# Patient Record
Sex: Female | Born: 1955 | Race: Black or African American | Hispanic: No | Marital: Married | Smoking: Never smoker
Health system: Southern US, Community
[De-identification: ages and names within clinical notes are randomized; demographics above are authoritative.]

## PROBLEM LIST (undated history)

## (undated) DIAGNOSIS — R55 Syncope and collapse: Secondary | ICD-10-CM

## (undated) DIAGNOSIS — M5412 Radiculopathy, cervical region: Secondary | ICD-10-CM

## (undated) DIAGNOSIS — Z973 Presence of spectacles and contact lenses: Secondary | ICD-10-CM

## (undated) DIAGNOSIS — IMO0002 Reserved for concepts with insufficient information to code with codable children: Secondary | ICD-10-CM

## (undated) DIAGNOSIS — Z923 Personal history of irradiation: Secondary | ICD-10-CM

## (undated) DIAGNOSIS — G620 Drug-induced polyneuropathy: Secondary | ICD-10-CM

## (undated) DIAGNOSIS — IMO0001 Reserved for inherently not codable concepts without codable children: Secondary | ICD-10-CM

## (undated) DIAGNOSIS — C50919 Malignant neoplasm of unspecified site of unspecified female breast: Secondary | ICD-10-CM

## (undated) DIAGNOSIS — E039 Hypothyroidism, unspecified: Secondary | ICD-10-CM

## (undated) DIAGNOSIS — Z86718 Personal history of other venous thrombosis and embolism: Secondary | ICD-10-CM

## (undated) DIAGNOSIS — E119 Type 2 diabetes mellitus without complications: Secondary | ICD-10-CM

## (undated) DIAGNOSIS — C7800 Secondary malignant neoplasm of unspecified lung: Secondary | ICD-10-CM

## (undated) DIAGNOSIS — Z860100 Personal history of colon polyps, unspecified: Secondary | ICD-10-CM

## (undated) DIAGNOSIS — I890 Lymphedema, not elsewhere classified: Secondary | ICD-10-CM

## (undated) DIAGNOSIS — H409 Unspecified glaucoma: Secondary | ICD-10-CM

## (undated) DIAGNOSIS — J9610 Chronic respiratory failure, unspecified whether with hypoxia or hypercapnia: Secondary | ICD-10-CM

## (undated) DIAGNOSIS — L853 Xerosis cutis: Secondary | ICD-10-CM

## (undated) DIAGNOSIS — F419 Anxiety disorder, unspecified: Secondary | ICD-10-CM

## (undated) DIAGNOSIS — D649 Anemia, unspecified: Secondary | ICD-10-CM

## (undated) DIAGNOSIS — I10 Essential (primary) hypertension: Secondary | ICD-10-CM

## (undated) DIAGNOSIS — T451X5A Adverse effect of antineoplastic and immunosuppressive drugs, initial encounter: Secondary | ICD-10-CM

## (undated) DIAGNOSIS — L719 Rosacea, unspecified: Secondary | ICD-10-CM

## (undated) DIAGNOSIS — Z8719 Personal history of other diseases of the digestive system: Secondary | ICD-10-CM

## (undated) DIAGNOSIS — K219 Gastro-esophageal reflux disease without esophagitis: Secondary | ICD-10-CM

## (undated) HISTORY — DX: Rosacea, unspecified: L71.9

## (undated) HISTORY — DX: Personal history of colon polyps, unspecified: Z86.0100

## (undated) HISTORY — PX: TONSILLECTOMY: SUR1361

## (undated) HISTORY — PX: COLONOSCOPY: SHX174

## (undated) HISTORY — DX: Syncope and collapse: R55

## (undated) HISTORY — DX: Malignant neoplasm of unspecified site of unspecified female breast: C50.919

## (undated) HISTORY — DX: Gastro-esophageal reflux disease without esophagitis: K21.9

## (undated) HISTORY — DX: Personal history of colonic polyps: Z86.010

## (undated) HISTORY — DX: Essential (primary) hypertension: I10

## (undated) HISTORY — DX: Hypothyroidism, unspecified: E03.9

## (undated) HISTORY — DX: Radiculopathy, cervical region: M54.12

## (undated) HISTORY — DX: Lymphedema, not elsewhere classified: I89.0

---

## 1987-04-05 HISTORY — PX: MASTECTOMY: SHX3

## 1988-06-04 HISTORY — PX: ABDOMINAL HYSTERECTOMY: SHX81

## 1998-03-02 ENCOUNTER — Other Ambulatory Visit: Admission: RE | Admit: 1998-03-02 | Discharge: 1998-03-02 | Payer: Self-pay | Admitting: Gastroenterology

## 2004-09-23 ENCOUNTER — Ambulatory Visit: Payer: Self-pay | Admitting: Internal Medicine

## 2004-10-15 ENCOUNTER — Ambulatory Visit: Payer: Self-pay | Admitting: Internal Medicine

## 2004-11-18 ENCOUNTER — Ambulatory Visit: Payer: Self-pay | Admitting: Internal Medicine

## 2005-01-21 ENCOUNTER — Ambulatory Visit: Payer: Self-pay | Admitting: Internal Medicine

## 2005-03-24 ENCOUNTER — Ambulatory Visit: Payer: Self-pay | Admitting: Internal Medicine

## 2005-07-17 ENCOUNTER — Ambulatory Visit: Payer: Self-pay | Admitting: Internal Medicine

## 2005-09-02 ENCOUNTER — Ambulatory Visit: Payer: Self-pay | Admitting: Cardiology

## 2005-09-02 ENCOUNTER — Ambulatory Visit: Payer: Self-pay | Admitting: Internal Medicine

## 2005-11-12 ENCOUNTER — Ambulatory Visit: Payer: Self-pay | Admitting: Internal Medicine

## 2005-11-20 ENCOUNTER — Ambulatory Visit: Payer: Self-pay | Admitting: Internal Medicine

## 2006-01-02 ENCOUNTER — Ambulatory Visit: Payer: Self-pay | Admitting: Internal Medicine

## 2006-02-02 ENCOUNTER — Ambulatory Visit: Payer: Self-pay | Admitting: Internal Medicine

## 2006-02-09 ENCOUNTER — Ambulatory Visit: Payer: Self-pay | Admitting: Internal Medicine

## 2006-04-23 ENCOUNTER — Ambulatory Visit: Payer: Self-pay | Admitting: Gastroenterology

## 2006-05-05 ENCOUNTER — Ambulatory Visit: Payer: Self-pay | Admitting: Gastroenterology

## 2006-05-05 ENCOUNTER — Encounter (INDEPENDENT_AMBULATORY_CARE_PROVIDER_SITE_OTHER): Payer: Self-pay | Admitting: Specialist

## 2006-10-05 ENCOUNTER — Ambulatory Visit: Payer: Self-pay | Admitting: Internal Medicine

## 2006-11-05 ENCOUNTER — Ambulatory Visit: Payer: Self-pay | Admitting: Internal Medicine

## 2006-11-10 ENCOUNTER — Ambulatory Visit: Payer: Self-pay | Admitting: Internal Medicine

## 2007-02-16 ENCOUNTER — Ambulatory Visit: Payer: Self-pay | Admitting: Internal Medicine

## 2007-02-16 DIAGNOSIS — I10 Essential (primary) hypertension: Secondary | ICD-10-CM

## 2007-02-16 DIAGNOSIS — E039 Hypothyroidism, unspecified: Secondary | ICD-10-CM

## 2007-05-24 ENCOUNTER — Ambulatory Visit: Payer: Self-pay | Admitting: Internal Medicine

## 2007-05-24 LAB — CONVERTED CEMR LAB
AST: 26 units/L (ref 0–37)
Albumin: 4.1 g/dL (ref 3.5–5.2)
Alkaline Phosphatase: 83 units/L (ref 39–117)
BUN: 9 mg/dL (ref 6–23)
Basophils Absolute: 0 10*3/uL (ref 0.0–0.1)
Basophils Relative: 0.1 % (ref 0.0–1.0)
Bilirubin Urine: NEGATIVE
CO2: 29 meq/L (ref 19–32)
Chloride: 105 meq/L (ref 96–112)
Cholesterol: 195 mg/dL (ref 0–200)
Creatinine, Ser: 0.8 mg/dL (ref 0.4–1.2)
HCT: 36.7 % (ref 36.0–46.0)
Hemoglobin: 12.5 g/dL (ref 12.0–15.0)
LDL Cholesterol: 141 mg/dL — ABNORMAL HIGH (ref 0–99)
MCHC: 34 g/dL (ref 30.0–36.0)
Monocytes Relative: 8 % (ref 3.0–11.0)
Neutrophils Relative %: 49.8 % (ref 43.0–77.0)
Nitrite: NEGATIVE
Protein, U semiquant: NEGATIVE
RBC: 4.09 M/uL (ref 3.87–5.11)
RDW: 12.8 % (ref 11.5–14.6)
Total Bilirubin: 0.3 mg/dL (ref 0.3–1.2)
Total CHOL/HDL Ratio: 5.8
Total Protein: 7.3 g/dL (ref 6.0–8.3)
Urobilinogen, UA: NEGATIVE
VLDL: 20 mg/dL (ref 0–40)

## 2007-05-31 ENCOUNTER — Ambulatory Visit: Payer: Self-pay | Admitting: Internal Medicine

## 2007-05-31 DIAGNOSIS — Z860100 Personal history of colon polyps, unspecified: Secondary | ICD-10-CM

## 2007-05-31 DIAGNOSIS — L719 Rosacea, unspecified: Secondary | ICD-10-CM

## 2007-05-31 HISTORY — DX: Rosacea, unspecified: L71.9

## 2007-05-31 HISTORY — DX: Personal history of colon polyps, unspecified: Z86.0100

## 2007-05-31 HISTORY — DX: Personal history of colonic polyps: Z86.010

## 2007-08-17 ENCOUNTER — Telehealth: Payer: Self-pay | Admitting: Internal Medicine

## 2007-08-19 ENCOUNTER — Ambulatory Visit: Payer: Self-pay | Admitting: Internal Medicine

## 2007-10-01 ENCOUNTER — Ambulatory Visit: Payer: Self-pay | Admitting: Internal Medicine

## 2007-10-01 DIAGNOSIS — M5412 Radiculopathy, cervical region: Secondary | ICD-10-CM

## 2007-10-01 HISTORY — DX: Radiculopathy, cervical region: M54.12

## 2007-10-05 ENCOUNTER — Encounter: Admission: RE | Admit: 2007-10-05 | Discharge: 2007-10-05 | Payer: Self-pay | Admitting: Internal Medicine

## 2007-10-08 ENCOUNTER — Telehealth: Payer: Self-pay | Admitting: Internal Medicine

## 2007-10-21 ENCOUNTER — Encounter: Payer: Self-pay | Admitting: Internal Medicine

## 2007-12-22 ENCOUNTER — Telehealth: Payer: Self-pay | Admitting: Internal Medicine

## 2008-08-21 ENCOUNTER — Ambulatory Visit: Payer: Self-pay | Admitting: Internal Medicine

## 2008-08-21 LAB — CONVERTED CEMR LAB
BUN: 7 mg/dL (ref 6–23)
CO2: 33 meq/L — ABNORMAL HIGH (ref 19–32)
Calcium: 9.5 mg/dL (ref 8.4–10.5)
Creatinine, Ser: 0.7 mg/dL (ref 0.4–1.2)
Glucose, Bld: 123 mg/dL — ABNORMAL HIGH (ref 70–99)

## 2008-10-09 ENCOUNTER — Ambulatory Visit: Payer: Self-pay | Admitting: Internal Medicine

## 2008-10-19 ENCOUNTER — Telehealth: Payer: Self-pay | Admitting: Internal Medicine

## 2009-07-09 ENCOUNTER — Ambulatory Visit: Payer: Self-pay | Admitting: Internal Medicine

## 2009-07-09 LAB — CONVERTED CEMR LAB
Albumin: 4.4 g/dL (ref 3.5–5.2)
Alkaline Phosphatase: 77 units/L (ref 39–117)
Basophils Relative: 0.4 % (ref 0.0–3.0)
Calcium: 9.6 mg/dL (ref 8.4–10.5)
Chloride: 99 meq/L (ref 96–112)
Creatinine, Ser: 0.8 mg/dL (ref 0.4–1.2)
Eosinophils Absolute: 0.1 10*3/uL (ref 0.0–0.7)
HCT: 40.8 % (ref 36.0–46.0)
HDL: 38.8 mg/dL — ABNORMAL LOW (ref >39.00)
Hemoglobin: 13.6 g/dL (ref 12.0–15.0)
Lymphocytes Relative: 44.3 % (ref 12.0–46.0)
Lymphs Abs: 3.5 10*3/uL (ref 0.7–4.0)
MCHC: 33.2 g/dL (ref 30.0–36.0)
MCV: 93.9 fL (ref 78.0–100.0)
Neutro Abs: 3.8 10*3/uL (ref 1.4–7.7)
RBC: 4.35 M/uL (ref 3.87–5.11)
TSH: 1.42 microintl units/mL (ref 0.35–5.50)
Total Protein: 8.4 g/dL — ABNORMAL HIGH (ref 6.0–8.3)

## 2009-09-19 ENCOUNTER — Ambulatory Visit: Payer: Self-pay | Admitting: Internal Medicine

## 2009-09-19 LAB — CONVERTED CEMR LAB
BUN: 9 mg/dL (ref 6–23)
Calcium: 9.5 mg/dL (ref 8.4–10.5)
GFR calc non Af Amer: 96.35 mL/min (ref >60)
Glucose, Bld: 101 mg/dL — ABNORMAL HIGH (ref 70–99)
Sodium: 140 meq/L (ref 135–145)

## 2009-10-08 ENCOUNTER — Telehealth: Payer: Self-pay | Admitting: Internal Medicine

## 2010-02-12 ENCOUNTER — Ambulatory Visit: Payer: Self-pay | Admitting: Internal Medicine

## 2010-02-12 LAB — CONVERTED CEMR LAB
Calcium: 9.7 mg/dL (ref 8.4–10.5)
Creatinine, Ser: 0.8 mg/dL (ref 0.4–1.2)
GFR calc non Af Amer: 99.06 mL/min (ref >60)

## 2010-02-20 ENCOUNTER — Telehealth: Payer: Self-pay | Admitting: Internal Medicine

## 2010-05-03 ENCOUNTER — Telehealth: Payer: Self-pay | Admitting: Internal Medicine

## 2010-05-03 ENCOUNTER — Ambulatory Visit: Payer: Self-pay | Admitting: Cardiology

## 2010-05-08 ENCOUNTER — Ambulatory Visit: Payer: Self-pay | Admitting: Internal Medicine

## 2010-05-08 LAB — CONVERTED CEMR LAB
ALT: 16 units/L (ref 0–35)
Albumin: 4.3 g/dL (ref 3.5–5.2)
BUN: 13 mg/dL (ref 6–23)
Bilirubin Urine: NEGATIVE
Chloride: 104 meq/L (ref 96–112)
Cholesterol: 194 mg/dL (ref 0–200)
Eosinophils Relative: 1 % (ref 0.0–5.0)
Glucose, Bld: 112 mg/dL — ABNORMAL HIGH (ref 70–99)
Glucose, Urine, Semiquant: NEGATIVE
HCT: 38.2 % (ref 36.0–46.0)
Hemoglobin: 12.8 g/dL (ref 12.0–15.0)
Lymphs Abs: 2.4 10*3/uL (ref 0.7–4.0)
MCV: 93.4 fL (ref 78.0–100.0)
Monocytes Absolute: 0.4 10*3/uL (ref 0.1–1.0)
Neutro Abs: 3.1 10*3/uL (ref 1.4–7.7)
Platelets: 233 10*3/uL (ref 150.0–400.0)
Potassium: 4.5 meq/L (ref 3.5–5.1)
RDW: 13.7 % (ref 11.5–14.6)
TSH: 1.47 microintl units/mL (ref 0.35–5.50)
Total Bilirubin: 0.6 mg/dL (ref 0.3–1.2)
Total Protein: 7.6 g/dL (ref 6.0–8.3)
WBC: 6 10*3/uL (ref 4.5–10.5)
pH: 7

## 2010-05-15 ENCOUNTER — Ambulatory Visit: Payer: Self-pay | Admitting: Internal Medicine

## 2010-05-15 DIAGNOSIS — R55 Syncope and collapse: Secondary | ICD-10-CM

## 2010-05-15 HISTORY — DX: Syncope and collapse: R55

## 2010-05-21 ENCOUNTER — Encounter: Payer: Self-pay | Admitting: Internal Medicine

## 2010-06-19 ENCOUNTER — Telehealth (INDEPENDENT_AMBULATORY_CARE_PROVIDER_SITE_OTHER): Payer: Self-pay

## 2010-06-20 ENCOUNTER — Encounter (HOSPITAL_COMMUNITY)
Admission: RE | Admit: 2010-06-20 | Discharge: 2010-08-03 | Payer: Self-pay | Source: Home / Self Care | Attending: Internal Medicine | Admitting: Internal Medicine

## 2010-06-20 ENCOUNTER — Ambulatory Visit: Payer: Self-pay

## 2010-06-20 ENCOUNTER — Encounter: Payer: Self-pay | Admitting: Cardiovascular Disease

## 2010-06-20 ENCOUNTER — Encounter: Payer: Self-pay | Admitting: Cardiology

## 2010-06-20 ENCOUNTER — Ambulatory Visit: Payer: Self-pay | Admitting: Cardiology

## 2010-06-24 ENCOUNTER — Telehealth: Payer: Self-pay | Admitting: Internal Medicine

## 2010-08-21 ENCOUNTER — Ambulatory Visit: Admit: 2010-08-21 | Payer: Self-pay | Admitting: Internal Medicine

## 2010-08-24 ENCOUNTER — Encounter: Payer: Self-pay | Admitting: Internal Medicine

## 2010-09-03 NOTE — Progress Notes (Signed)
  Phone Note Outgoing Call   Call placed by: Lynann Beaver CMA,  May 03, 2010 3:24 PM Call placed to: Patient Summary of Call: Per Dr. Lovell Sheehan........Marland KitchenIce the affected area of swelling around eyes...Marland KitchenMarland KitchenIbuprofen 800mg . three times a day and call Opthamalogy if any visual disturbances.  Left message on personal voice mail. Initial call taken by: Lynann Beaver CMA,  May 03, 2010 3:25 PM

## 2010-09-03 NOTE — Progress Notes (Signed)
Summary: K ?  Phone Note Call from Patient Call back at Home Phone [phone]   Caller: vm Reason for Call: Lab or Test Results Summary of Call: Tues.  Am I to continue K or discontinue? Initial call taken by: Rudy Jew, RN,  February 20, 2010 2:14 PM  Follow-up for Phone Call        CONTINUE Follow-up by: Stacie Glaze MD,  February 20, 2010 3:20 PM  Additional Follow-up for Phone Call Additional follow up Details #1::        Phone Call Completed Additional Follow-up by: Rudy Jew, RN,  February 20, 2010 3:41 PM

## 2010-09-03 NOTE — Miscellaneous (Signed)
Summary: Appointment Canceled  Appointment status changed to canceled by [employer] on 05/16/2010 11:48 AM.  Cancellation Comments --------------------- crs/ wt: 216/ dx: dizziness, abd ekg, dr. Lovell Sheehan, [email or web address] precret requ./gd  Appointment Information ----------------------- Appt Type:  CARDIOLOGY NUCLEAR TESTING      Date:  Thursday, May 23, 2010      Time:  8:00 AM for 15 min   Urgency:  Routine   Made By:  Hoy Finlay Scheduler  To Visit:  [id number]    Reason:  crs/ wt: 216/ dx: dizziness, abd ekg, dr. Lovell Sheehan, [email or web address] precret requ./gd  Appt Comments ------------- -- 05/16/10 11:48: ([facility]) CANCELED -- crs/ wt: 216/ dx: dizziness, abd ekg, dr. Lovell Sheehan, bcbs.no precret requ./gd -- 05/16/10 9:35: ([facility]) BOOKED -- Routine CARDIOLOGY NUCLEAR TESTING at 05/23/2010 8:00 AM for 15 min crs/ wt: 216/ dx: dizziness, abd e  Appended Document: Appointment Canceled pt cancelled cardiolyte with abnormal ekg please call  Appended Document: Appointment Canceled pt rescheduled till 11-17

## 2010-09-03 NOTE — Assessment & Plan Note (Signed)
Summary: back and knee pain//ccm   Vital Signs:  Patient profile:   55 year old female Weight:      225 pounds BMI:     37.58 Temp:     98.2 degrees F oral Pulse rate:   74 / minute Pulse rhythm:   regular BP sitting:   136 / 84  Nutrition Counseling: Patient's BMI is greater than 25 and therefore counseled on weight management options.  History of Present Illness: reveiw of labs discussion of potassium and moniterin the effect of adding potassium foods acute increasd pain in right knee in meniscal areaq consitant with meniscal tear. gate issues have caused mild back spasms obesity and age are the root problems with failyre to maintain exercize   Problems Prior to Update: 1)  Dermatofibroma, Arm, Right  (ICD-216.6) 2)  Anxiety Depression  (ICD-300.4) 3)  Cervical Strain, With Radiculopathy  (ICD-723.4) 4)  Uri  (ICD-465.9) 5)  Neck Pain, Right  (ICD-723.1) 6)  Mastectomy, Right, Hx of  (ICD-V45.71) 7)  Morbid Obesity  (ICD-278.01) 8)  Colonic Polyps, Hx of  (ICD-V12.72) 9)  Family History of Cad Female 1st Degree Relative <50  (ICD-V17.3) 10)  Family History of Colon Ca 1st Degree Relative <60  (ICD-V16.0) 11)  Rosacea  (ICD-695.3) 12)  Physical Examination  (ICD-V70.0) 13)  Skin Rash  (ICD-782.1) 14)  Hypothyroidism  (ICD-244.9) 15)  Hypertension  (ICD-401.9) 16)  Gerd  (ICD-530.81) 17)  Allergic Rhinitis  (ICD-477.9)  Medications Prior to Update: 1)  Synthroid 50 Mcg  Tabs (Levothyroxine Sodium) .Marland Kitchen.. 1 By Mouth Once Daily 2)  Cardizem Cd 240 Mg  Cp24 (Diltiazem Hcl Coated Beads) .Marland Kitchen.. 1 By Mouth Once Daily 3)  Microzide 12.5 Mg  Caps (Hydrochlorothiazide) .Marland Kitchen.. 1 By Mouth Once Daily 4)  Nexium 40 Mg  Cpdr (Esomeprazole Magnesium) .Marland Kitchen.. 1 By Mouth Once Daily 5)  Xalatan 0.005 %  Soln (Latanoprost) .... Once Daily  Current Medications (verified): 1)  Synthroid 50 Mcg  Tabs (Levothyroxine Sodium) .Marland Kitchen.. 1 By Mouth Once Daily 2)  Cardizem Cd 240 Mg  Cp24 (Diltiazem Hcl  Coated Beads) .Marland Kitchen.. 1 By Mouth Once Daily 3)  Microzide 12.5 Mg  Caps (Hydrochlorothiazide) .Marland Kitchen.. 1 By Mouth Once Daily 4)  Nexium 40 Mg  Cpdr (Esomeprazole Magnesium) .Marland Kitchen.. 1 By Mouth Once Daily 5)  Xalatan 0.005 %  Soln (Latanoprost) .... Once Daily  Allergies: 1)  ! Sulfa  Past History:  Family History: Last updated: 05/31/2007 mother colon cancer Family History of Colon CA 1st degree relative <60 father Family History of CAD Female 1st degree relative <50  Social History: Last updated: 05/31/2007 Married Never Smoked Alcohol use-yes Drug use-no Regular exercise-no  Risk Factors: Exercise: no (05/31/2007)  Risk Factors: Smoking Status: never (07/09/2009)  Past medical, surgical, family and social histories (including risk factors) reviewed, and no changes noted (except as noted below).  Past Medical History: Reviewed history from 10/01/2007 and no changes required. Allergic rhinitis GERD Hypertension Hypothyroidism rosacia Colonic polyps, hx of weight gain  Past Surgical History: Reviewed history from 05/31/2007 and no changes required. Hysterectomy Tonsillectomy Caesarean section x1 Mastectomy  right 1988  Family History: Reviewed history from 05/31/2007 and no changes required. mother colon cancer Family History of Colon CA 1st degree relative <60 father Family History of CAD Female 1st degree relative <50  Social History: Reviewed history from 05/31/2007 and no changes required. Married Never Smoked Alcohol use-yes Drug use-no Regular exercise-no  Review of Systems  The patient denies anorexia,  fever, weight loss, weight gain, vision loss, decreased hearing, hoarseness, chest pain, syncope, dyspnea on exertion, peripheral edema, prolonged cough, headaches, hemoptysis, abdominal pain, melena, hematochezia, severe indigestion/heartburn, hematuria, incontinence, genital sores, muscle weakness, suspicious skin lesions, transient blindness, difficulty  walking, depression, unusual weight change, abnormal bleeding, enlarged lymph nodes, angioedema, and breast masses.    Physical Exam  General:  Well-developed,well-nourished,in no acute distress; alert,appropriate and cooperative throughout examination  overweight-appearing.   Head:  atraumatic.  normocephalic.   Ears:  R ear normal and L ear normal.   Nose:  no external deformity and nasal dischargemucosal pallor.   Mouth:  pharynx pink and moist and no erythema.   Neck:  full ROM, no masses, and nuchal rigidity.   Lungs:  normal respiratory effort and no wheezes.   Heart:  normal rate and regular rhythm.   Abdomen:  soft, non-tender, and normal bowel sounds.   Msk:  No deformity or scoliosis noted of thoracic or lumbar spine.   Neurologic:  alert & oriented X3, gait normal, and DTRs symmetrical and normal.     Impression & Recommendations:  Problem # 1:  HYPOTHYROIDISM (ICD-244.9) Assessment Unchanged  Her updated medication list for this problem includes:    Synthroid 50 Mcg Tabs (Levothyroxine sodium) .Marland Kitchen... 1 by mouth once daily  Labs Reviewed: TSH: 1.42 (07/09/2009)   Total T4: 9.0 (08/21/2008)    Chol: 203 (07/09/2009)   HDL: 38.80 (07/09/2009)   LDL: 141 (05/24/2007)   TG: 101 (05/24/2007)  Problem # 2:  HYPERTENSION (ICD-401.9) Assessment: Deteriorated  Her updated medication list for this problem includes:    Cardizem Cd 240 Mg Cp24 (Diltiazem hcl coated beads) .Marland Kitchen... 1 by mouth once daily    Microzide 12.5 Mg Caps (Hydrochlorothiazide) .Marland Kitchen... 1 by mouth once daily  BP today: 136/84 Prior BP: 140/80 (07/09/2009)  Prior 10 Yr Risk Heart Disease: 20 % (10/01/2007)  Labs Reviewed: K+: 3.2 (07/09/2009) Creat: : 0.8 (07/09/2009)   Chol: 203 (07/09/2009)   HDL: 38.80 (07/09/2009)   LDL: 141 (05/24/2007)   TG: 101 (05/24/2007)  Problem # 3:  HYPOKALEMIA, MILD (ICD-276.8)  monitering she is taking otc potassium and we need to see if this is sufficient  Orders: TLB-BMP  (Basic Metabolic Panel-BMET) (80048-METABOL)  Problem # 4:  MORBID OBESITY (ICD-278.01) weight loss  Problem # 5:  LOC OSTEOARTHROS NOT SPEC PRIM/SEC LOWER LEG (ICD-715.36)  Informed consen obtained and then the right knee joint was prepped in a sterile manor and 40 mg depo and 1/2 cc 1% lidocaine injected into the synovial space. After care discussed. Pt tolerated procedure well.  Discussed use of medications, application of heat or cold, and exercises.   Orders: Joint Aspirate / Injection, Large (20610) Depo- Medrol 40mg  (J1030)  Complete Medication List: 1)  Synthroid 50 Mcg Tabs (Levothyroxine sodium) .Marland Kitchen.. 1 by mouth once daily 2)  Cardizem Cd 240 Mg Cp24 (Diltiazem hcl coated beads) .Marland Kitchen.. 1 by mouth once daily 3)  Microzide 12.5 Mg Caps (Hydrochlorothiazide) .Marland Kitchen.. 1 by mouth once daily 4)  Nexium 40 Mg Cpdr (Esomeprazole magnesium) .Marland Kitchen.. 1 by mouth once daily 5)  Xalatan 0.005 % Soln (Latanoprost) .... Once daily  Other Orders: Venipuncture (16109)  Patient Instructions: 1)  weight loss is the key 2)  [email or web address] 3)  Please schedule a follow-up appointment in 3 months.

## 2010-09-03 NOTE — Assessment & Plan Note (Signed)
Summary: Cardiology Nuclear Testing  Nuclear Med Background Indications for Stress Test: Evaluation for Ischemia     Symptoms: Diaphoresis, Dizziness, Light-Headedness, Palpitations    Nuclear Pre-Procedure Cardiac Risk Factors: Family History - CAD, Hypertension Caffeine/Decaff Intake: None NPO After: 7:00 PM Lungs: clear IV 0.9% NS with Angio Cath: 20g     IV Site: L Wrist IV Started by: Stanton Kidney, EMT-P Chest Size (in) 44     Cup Size c     Height (in): 64 Weight (lb): 217 BMI: 37.38  Nuclear Med Study 1 or 2 day study:  1 day     Stress Test Type:  Stress Reading MD:  Olga Millers, MD     Referring MD:  J.Jenkins Resting Radionuclide:  Technetium 26m Tetrofosmin     Resting Radionuclide Dose:  11 mCi  Stress Radionuclide:  Technetium 34m Tetrofosmin     Stress Radionuclide Dose:  33 mCi   Stress Protocol Exercise Time (min):  5:30 min     Max HR:  148 bpm     Predicted Max HR:  166 bpm  Max Systolic BP: 175 mm Hg     Percent Max HR:  89.16 %     METS: 7.0 Rate Pressure Product:  16109    Stress Test Technologist:  Milana Na, EMT-P     Nuclear Technologist:  Domenic Polite, CNMT  Rest Procedure  Myocardial perfusion imaging was performed at rest 45 minutes following the intravenous administration of Technetium 41m Tetrofosmin.  Stress Procedure  The patient exercised for 5:30. The patient stopped due to fatigue and denied any chest pain.  There were non specific ST-T wave changes.  Technetium 71m Tetrofosmin was injected at peak exercise and myocardial perfusion imaging was performed after a brief delay.  QPS Raw Data Images:  Acquisition technically good; normal left ventricular size. Stress Images:  Normal homogeneous uptake in all areas of the myocardium. Rest Images:  Normal homogeneous uptake in all areas of the myocardium. Subtraction (SDS):  No evidence of ischemia. Transient Ischemic Dilatation:  1.04  (Normal <1.22)  Lung/Heart Ratio:  .30   (Normal <0.45)  Quantitative Gated Spect Images QGS EDV:  82 ml QGS ESV:  28 ml QGS EF:  66 % QGS cine images:  Normal wall motion.   Overall Impression  Exercise Capacity: Fair exercise capacity. BP Response: Normal blood pressure response. Clinical Symptoms: No chest pain ECG Impression: No significant ST segment change suggestive of ischemia. Overall Impression: Normal stress nuclear study with no ischemia or infarction.  Appended Document: Cardiology Nuclear Testing normal cardiolyte  Appended Document: Cardiology Nuclear Testing pt informed

## 2010-09-03 NOTE — Progress Notes (Signed)
Summary: results needed  Phone Note Call from Patient Call back at Home Phone [phone]   Caller: Patient---triage vm Reason for Call: Lab or Test Results Summary of Call: requesting stress test results. Initial call taken by: Warnell Forester,  June 24, 2010 12:57 PM  Follow-up for Phone Call        pt informed nonrmal stress Follow-up by: Willy Eddy, LPN,  June 24, 2010 1:12 PM

## 2010-09-03 NOTE — Progress Notes (Signed)
Summary: lab results.  Phone Note Call from Patient   Caller: Patient Call For: Stacie Glaze MD Complaint: Urinary/GYN Problems Summary of Call: Pt is calling for lab results. [id number] Initial call taken by: Lynann Beaver CMA,  October 08, 2009 10:10 AM  Follow-up for Phone Call        THIS WAS A REPEAT FROM LOW K LAST OV- ON  HCTZ Follow-up by: Willy Eddy, LPN,  October 08, 2009 10:34 AM  Additional Follow-up for Phone Call Additional follow up Details #1::        per dr Lovell Sheehan- start k 8 meq  once daily  Additional Follow-up by: Willy Eddy, LPN,  October 08, 2009 12:04 PM    New/Updated Medications: KLOR-CON 8 MEQ CR-TABS (POTASSIUM CHLORIDE) 1 once daily Prescriptions: KLOR-CON 8 MEQ CR-TABS (POTASSIUM CHLORIDE) 1 once daily  #30 x 6   Entered by:   Willy Eddy, LPN   Authorized by:   Stacie Glaze MD   Signed by:   Willy Eddy, LPN on 36/64/4034   Method used:   Electronically to        [facility]  [address] [phone]* (retail)       [address] [place], [place]  [address]       Ph: [phone] or [phone]       Fax: [phone]   RxID:   [id number]

## 2010-09-03 NOTE — Progress Notes (Signed)
Summary: head trauma  Phone Note Call from Patient   Caller: Patient Call For: Stacie Glaze MD Reason for Call: Acute Illness Summary of Call: [phone] Pt had a fall on Wed and bumped head on end table with swelling of eyes and headache. Would like to be seen today. Initial call taken by: Lynann Beaver CMA,  May 03, 2010 8:26 AM  Follow-up for Phone Call        non contrast ct of head for facial trauma per dr Lovell Sheehan Follow-up by: Willy Eddy, LPN,  May 03, 2010 9:15 AM  Additional Follow-up for Phone Call Additional follow up Details #1::        Appt Scheduled Today.  Pt aware. Additional Follow-up by: Corky Mull,  May 03, 2010 9:40 AM  New Problems: CONTUSION OF FACE SCALP AND NECK EXCEPT EYE (ICD-920)   New Problems: CONTUSION OF FACE SCALP AND NECK EXCEPT EYE (ICD-920)

## 2010-09-03 NOTE — Progress Notes (Signed)
Summary: Nuc. Pre-Procedure  Phone Note Outgoing Call Call back at [facility] Phone [phone]   Call placed by: Irean Hong, RN,  June 19, 2010 2:04 PM Summary of Call: Left message with information on [employer] Information Sheet (see scanned document for details).      Nuclear Med Background Indications for Stress Test: Evaluation for Ischemia     Symptoms: Diaphoresis    Nuclear Pre-Procedure Cardiac Risk Factors: Family History - CAD, Hypertension Height (in): 65

## 2010-09-03 NOTE — Assessment & Plan Note (Signed)
Summary: cpx--no pap//ccm   Vital Signs:  Patient profile:   55 year old female Height:      65 inches Weight:      216 pounds BMI:     36.07 Temp:     98.2 degrees F oral Pulse rate:   72 / minute Resp:     14 per minute BP sitting:   136 / 80  (left arm)  Vitals Entered By: Willy Eddy, LPN (May 15, 2010 11:28 AM) CC: cpx Is Patient Diabetic? No   Primary Care Provider:  Stacie Glaze MD  CC:  cpx.  History of Present Illness: The pt was asked about all immunizations, health maint. services that are appropriate to their age and was given guidance on diet exercize  and weight management   Falls? dizzy? could this be hyoglycemia frequent falls presyncopy? No chest pain, but pt is vague about what she feels prior to these evenet has mild diaphoresis prediabetes and obesit complicate this picture family hx is positive  Preventive Screening-Counseling & Management  Alcohol-Tobacco     Smoking Status: never  Problems Prior to Update: 1)  Prediabetes  (ICD-790.29) 2)  Contusion of Face Scalp and Neck Except Eye  (ICD-920) 3)  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  (ICD-715.36) 4)  Hypokalemia, Mild  (ICD-276.8) 5)  Dermatofibroma, Arm, Right  (ICD-216.6) 6)  Anxiety Depression  (ICD-300.4) 7)  Cervical Strain, With Radiculopathy  (ICD-723.4) 8)  Uri  (ICD-465.9) 9)  Neck Pain, Right  (ICD-723.1) 10)  Mastectomy, Right, Hx of  (ICD-V45.71) 11)  Morbid Obesity  (ICD-278.01) 12)  Colonic Polyps, Hx of  (ICD-V12.72) 13)  Family History of Cad Female 1st Degree Relative <50  (ICD-V17.3) 14)  Family History of Colon Ca 1st Degree Relative <60  (ICD-V16.0) 15)  Rosacea  (ICD-695.3) 16)  Physical Examination  (ICD-V70.0) 17)  Skin Rash  (ICD-782.1) 18)  Hypothyroidism  (ICD-244.9) 19)  Hypertension  (ICD-401.9) 20)  Gerd  (ICD-530.81) 21)  Allergic Rhinitis  (ICD-477.9)  Current Problems (verified): 1)  Contusion of Face Scalp and Neck Except Eye   (ICD-920) 2)  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  (ICD-715.36) 3)  Hypokalemia, Mild  (ICD-276.8) 4)  Dermatofibroma, Arm, Right  (ICD-216.6) 5)  Anxiety Depression  (ICD-300.4) 6)  Cervical Strain, With Radiculopathy  (ICD-723.4) 7)  Uri  (ICD-465.9) 8)  Neck Pain, Right  (ICD-723.1) 9)  Mastectomy, Right, Hx of  (ICD-V45.71) 10)  Morbid Obesity  (ICD-278.01) 11)  Colonic Polyps, Hx of  (ICD-V12.72) 12)  Family History of Cad Female 1st Degree Relative <50  (ICD-V17.3) 13)  Family History of Colon Ca 1st Degree Relative <60  (ICD-V16.0) 14)  Rosacea  (ICD-695.3) 15)  Physical Examination  (ICD-V70.0) 16)  Skin Rash  (ICD-782.1) 17)  Hypothyroidism  (ICD-244.9) 18)  Hypertension  (ICD-401.9) 19)  Gerd  (ICD-530.81) 20)  Allergic Rhinitis  (ICD-477.9)  Medications Prior to Update: 1)  Synthroid 50 Mcg  Tabs (Levothyroxine Sodium) .Marland Kitchen.. 1 By Mouth Once Daily 2)  Cardizem Cd 240 Mg  Cp24 (Diltiazem Hcl Coated Beads) .Marland Kitchen.. 1 By Mouth Once Daily 3)  Microzide 12.5 Mg  Caps (Hydrochlorothiazide) .Marland Kitchen.. 1 By Mouth Once Daily 4)  Nexium 40 Mg  Cpdr (Esomeprazole Magnesium) .Marland Kitchen.. 1 By Mouth Once Daily 5)  Xalatan 0.005 %  Soln (Latanoprost) .... Once Daily 6)  Klor-Con 8 Meq Cr-Tabs (Potassium Chloride) .Marland Kitchen.. 1 Once Daily  Current Medications (verified): 1)  Synthroid 50 Mcg  Tabs (Levothyroxine Sodium) .Marland Kitchen.. 1 By Mouth Once Daily 2)  Cardizem Cd 240 Mg  Cp24 (Diltiazem Hcl Coated Beads) .Marland Kitchen.. 1 By Mouth Once Daily 3)  Microzide 12.5 Mg  Caps (Hydrochlorothiazide) .Marland Kitchen.. 1 By Mouth Once Daily 4)  Nexium 40 Mg  Cpdr (Esomeprazole Magnesium) .Marland Kitchen.. 1 By Mouth Once Daily 5)  Xalatan 0.005 %  Soln (Latanoprost) .... Once Daily 6)  Klor-Con 8 Meq Cr-Tabs (Potassium Chloride) .Marland Kitchen.. 1 Once Daily  Allergies (verified): 1)  ! Sulfa  Past History:  Family History: Last updated: 05/31/2007 mother colon cancer Family History of Colon CA 1st degree relative <60 father Family History of CAD Female  1st degree relative <50  Social History: Last updated: 05/31/2007 Married Never Smoked Alcohol use-yes Drug use-no Regular exercise-no  Risk Factors: Exercise: no (05/31/2007)  Risk Factors: Smoking Status: never (05/15/2010)  Past medical, surgical, family and social histories (including risk factors) reviewed, and no changes noted (except as noted below).  Past Medical History: Reviewed history from 10/01/2007 and no changes required. Allergic rhinitis GERD Hypertension Hypothyroidism rosacia Colonic polyps, hx of weight gain  Past Surgical History: Reviewed history from 05/31/2007 and no changes required. Hysterectomy Tonsillectomy Caesarean section x1 Mastectomy  right 1988  Family History: Reviewed history from 05/31/2007 and no changes required. mother colon cancer Family History of Colon CA 1st degree relative <60 father Family History of CAD Female 1st degree relative <50  Social History: Reviewed history from 05/31/2007 and no changes required. Married Never Smoked Alcohol use-yes Drug use-no Regular exercise-no  Review of Systems  The patient denies anorexia, fever, weight loss, weight gain, vision loss, decreased hearing, hoarseness, chest pain, syncope, dyspnea on exertion, peripheral edema, prolonged cough, headaches, hemoptysis, abdominal pain, melena, hematochezia, severe indigestion/heartburn, hematuria, incontinence, genital sores, muscle weakness, suspicious skin lesions, transient blindness, difficulty walking, depression, unusual weight change, abnormal bleeding, enlarged lymph nodes, angioedema, and breast masses.    Physical Exam  General:  well-developed and overweight-appearing.   Head:  normocephalic.   Eyes:  pupils equal and pupils round.   Ears:  R ear normal and L ear normal.   Nose:  no external deformity and no nasal discharge.   Mouth:  pharynx pink and moist and no erythema.   Neck:  No deformities, masses, or tenderness  noted. Lungs:  Normal respiratory effort, chest expands symmetrically. Lungs are clear to auscultation, no crackles or wheezes. Heart:  Normal rate and regular rhythm. S1 and S2 normal without gallop, murmur, click, rub or other extra sounds. Abdomen:  soft, non-tender, and normal bowel sounds.   Msk:  No deformity or scoliosis noted of thoracic or lumbar spine.   Pulses:  R and L carotid,radial,femoral,dorsalis pedis and posterior tibial pulses are full and equal bilaterally Extremities:  trace left pedal edema and trace right pedal edema.   Neurologic:  alert & oriented X3, gait normal, and DTRs symmetrical and normal.   Cervical Nodes:  No lymphadenopathy noted Axillary Nodes:  No palpable lymphadenopathy Inguinal Nodes:  No significant adenopathy Psych:  Oriented X3 and not depressed appearing.     Impression & Recommendations:  Problem # 1:  CONTUSION OF FACE SCALP AND NECK EXCEPT EYE (ICD-920) falls due to hypoglucemia change of diet  also high CV risks stress test needed  Problem # 2:  PHYSICAL EXAMINATION (ICD-V70.0) The pt was asked about all immunizations, health maint. services that are appropriate to their age and was given guidance on diet exercize  and weight management  Mammogram:  Done (07/23/2004) Pap smear: normal (06/02/2008) Colonoscopy: Done (05/05/2006) Td Booster: Tdap (05/15/2010)   Chol: 194 (05/08/2010)   HDL: 45.10 (05/08/2010)   LDL: 126 (05/08/2010)   TG: 114.0 (05/08/2010) TSH: 1.47 (05/08/2010)   Next Colonoscopy due:: 05/2011 (05/15/2010)  Discussed using sunscreen, use of alcohol, drug use, self breast exam, routine dental care, routine eye care, schedule for GYN exam, routine physical exam, seat belts, multiple vitamins, osteoporosis prevention, adequate calcium intake in diet, recommendations for immunizations, mammograms and Pap smears.  Discussed exercise and checking cholesterol.  Discussed gun safety, safe sex, and contraception.  Problem # 3:   PREDIABETES (ICD-790.29) Assessment: New  discussed diet and weigth loss changes high risk for DM Labs Reviewed: Creat: 0.8 (05/08/2010)     Orders: Cardiology Referral (Cardiology)  Problem # 4:  SYNCOPE (ICD-780.2) possible syncopy related to falls with EKG changes and risk of HTN and prediabetes ( and family hx) will refer Orders: Cardiology Referral (Cardiology) could this be an angiinal equivalent?? risk stratification: higher risk due to age AA femalel and DM and HTN and family hx  Complete Medication List: 1)  Synthroid 50 Mcg Tabs (Levothyroxine sodium) .Marland Kitchen.. 1 by mouth once daily 2)  Cardizem Cd 240 Mg Cp24 (Diltiazem hcl coated beads) .Marland Kitchen.. 1 by mouth once daily 3)  Microzide 12.5 Mg Caps (Hydrochlorothiazide) .Marland Kitchen.. 1 by mouth once daily 4)  Nexium 40 Mg Cpdr (Esomeprazole magnesium) .Marland Kitchen.. 1 by mouth once daily 5)  Xalatan 0.005 % Soln (Latanoprost) .... Once daily 6)  Klor-con 8 Meq Cr-tabs (Potassium chloride) .Marland Kitchen.. 1 once daily  Other Orders: Tdap => 46yrs IM (40981) Admin 1st Vaccine (19147)  Patient Instructions: 1)  Please schedule a follow-up appointment in 3 months. 2)  follow a frequent  small meals and add protein to diet 3)  I have referred you for a stress test   Preventive Care Screening  Pap Smear:    Date:  06/02/2008    Next Due:  06/2011    Results:  normal   Colonoscopy:    Next Due:  05/2011      Immunizations Administered:  Tetanus Vaccine:    Vaccine Type: Tdap    Site: left deltoid    Mfr: GlaxoSmithKline    Dose: 0.5 ml    Route: IM    Given by: Willy Eddy, LPN    Exp. Date: 05/23/2012    Lot #: [id number]    VIS given: 06/21/08 version given May 15, 2010.   Appended Document: Orders Update    Clinical Lists Changes  Orders: Added new Service order of EKG w/ Interpretation (93000) - Signed

## 2010-09-03 NOTE — Assessment & Plan Note (Signed)
Summary: 3 mo rov/mm/pt rescd//ccm/pt rsc/cjr   Vital Signs:  Patient profile:   55 year old female Height:      65 inches Weight:      221 pounds BMI:     36.91 Temp:     98.2 degrees F oral Pulse rate:   76 / minute Resp:     14 per minute BP sitting:   140 / 80  (left arm)  Vitals Entered By: Willy Eddy, LPN (February 12, 2010 4:01 PM) CC: roa- check k+?   CC:  roa- check k+?.  History of Present Illness:  Follow-Up Visit      This is a 55 year old woman who presents for Follow-up visit.  The patient denies chest pain, palpitations, dizziness, syncope, low blood sugar symptoms, high blood sugar symptoms, edema, SOB, DOE, PND, and orthopnea.  Since the last visit the patient notes problems with medications.  When questioned about possible medication side effects, the patient notes cramping.    Preventive Screening-Counseling & Management  Alcohol-Tobacco     Smoking Status: never  Current Medications (verified): 1)  Synthroid 50 Mcg  Tabs (Levothyroxine Sodium) .Marland Kitchen.. 1 By Mouth Once Daily 2)  Cardizem Cd 240 Mg  Cp24 (Diltiazem Hcl Coated Beads) .Marland Kitchen.. 1 By Mouth Once Daily 3)  Microzide 12.5 Mg  Caps (Hydrochlorothiazide) .Marland Kitchen.. 1 By Mouth Once Daily 4)  Nexium 40 Mg  Cpdr (Esomeprazole Magnesium) .Marland Kitchen.. 1 By Mouth Once Daily 5)  Xalatan 0.005 %  Soln (Latanoprost) .... Once Daily 6)  Klor-Con 8 Meq Cr-Tabs (Potassium Chloride) .Marland Kitchen.. 1 Once Daily  Allergies (verified): 1)  ! Sulfa  Past History:  Family History: Last updated: 05/31/2007 mother colon cancer Family History of Colon CA 1st degree relative <60 father Family History of CAD Female 1st degree relative <50  Social History: Last updated: 05/31/2007 Married Never Smoked Alcohol use-yes Drug use-no Regular exercise-no  Risk Factors: Exercise: no (05/31/2007)  Risk Factors: Smoking Status: never (02/12/2010)  Past medical, surgical, family and social histories (including risk factors) reviewed, and no  changes noted (except as noted below).  Past Medical History: Reviewed history from 10/01/2007 and no changes required. Allergic rhinitis GERD Hypertension Hypothyroidism rosacia Colonic polyps, hx of weight gain  Past Surgical History: Reviewed history from 05/31/2007 and no changes required. Hysterectomy Tonsillectomy Caesarean section x1 Mastectomy  right 1988  Family History: Reviewed history from 05/31/2007 and no changes required. mother colon cancer Family History of Colon CA 1st degree relative <60 father Family History of CAD Female 1st degree relative <50  Social History: Reviewed history from 05/31/2007 and no changes required. Married Never Smoked Alcohol use-yes Drug use-no Regular exercise-no  Review of Systems  The patient denies anorexia, fever, weight loss, weight gain, vision loss, decreased hearing, hoarseness, chest pain, syncope, dyspnea on exertion, peripheral edema, prolonged cough, headaches, hemoptysis, abdominal pain, melena, hematochezia, severe indigestion/heartburn, hematuria, incontinence, genital sores, muscle weakness, suspicious skin lesions, transient blindness, difficulty walking, depression, unusual weight change, abnormal bleeding, enlarged lymph nodes, angioedema, and breast masses.    Physical Exam  General:  well-developed and overweight-appearing.   Head:  normocephalic.   Eyes:  pupils equal and pupils round.   Ears:  R ear normal and L ear normal.   Nose:  no external deformity and no nasal discharge.   Neck:  No deformities, masses, or tenderness noted. Chest Wall:  No deformities, masses, or tenderness noted. Lungs:  Normal respiratory effort, chest expands symmetrically. Lungs are clear  to auscultation, no crackles or wheezes. Heart:  Normal rate and regular rhythm. S1 and S2 normal without gallop, murmur, click, rub or other extra sounds.   Impression & Recommendations:  Problem # 1:  MORBID OBESITY (ICD-278.01) weight  loss  Problem # 2:  MASTECTOMY, RIGHT, HX OF (ICD-V45.71)  will need mamogram and will need mastectomy supplies  Orders: Durable Medical Equipment (DME)  Problem # 3:  ANXIETY DEPRESSION (ICD-300.4) stable  Problem # 4:  HYPOKALEMIA, MILD (ICD-276.8)  moniter blood work  Orders: Venipuncture (40102) TLB-BMP (Basic Metabolic Panel-BMET) (80048-METABOL)  Problem # 5:  LOC OSTEOARTHROS NOT SPEC PRIM/SEC LOWER LEG (ICD-715.36) knee has bee stable  Problem # 6:  ROSACEA (ICD-695.3) trial of topical creams  Complete Medication List: 1)  Synthroid 50 Mcg Tabs (Levothyroxine sodium) .Marland Kitchen.. 1 by mouth once daily 2)  Cardizem Cd 240 Mg Cp24 (Diltiazem hcl coated beads) .Marland Kitchen.. 1 by mouth once daily 3)  Microzide 12.5 Mg Caps (Hydrochlorothiazide) .Marland Kitchen.. 1 by mouth once daily 4)  Nexium 40 Mg Cpdr (Esomeprazole magnesium) .Marland Kitchen.. 1 by mouth once daily 5)  Xalatan 0.005 % Soln (Latanoprost) .... Once daily 6)  Klor-con 8 Meq Cr-tabs (Potassium chloride) .Marland Kitchen.. 1 once daily  Patient Instructions: 1)  CPX in OCT

## 2010-09-16 ENCOUNTER — Other Ambulatory Visit: Payer: Self-pay | Admitting: Internal Medicine

## 2010-09-25 ENCOUNTER — Other Ambulatory Visit: Payer: Self-pay | Admitting: Internal Medicine

## 2011-03-21 ENCOUNTER — Other Ambulatory Visit: Payer: Self-pay | Admitting: Internal Medicine

## 2011-06-13 ENCOUNTER — Encounter: Payer: Self-pay | Admitting: Gastroenterology

## 2011-06-19 ENCOUNTER — Other Ambulatory Visit: Payer: Self-pay | Admitting: Internal Medicine

## 2011-06-25 ENCOUNTER — Other Ambulatory Visit: Payer: Self-pay | Admitting: Internal Medicine

## 2011-08-14 ENCOUNTER — Encounter: Payer: Self-pay | Admitting: Internal Medicine

## 2011-08-14 ENCOUNTER — Ambulatory Visit (INDEPENDENT_AMBULATORY_CARE_PROVIDER_SITE_OTHER): Payer: BC Managed Care – PPO | Admitting: Internal Medicine

## 2011-08-14 VITALS — BP 144/84 | HR 76 | Temp 98.2°F | Resp 16 | Ht 64.0 in | Wt 230.0 lb

## 2011-08-14 DIAGNOSIS — E039 Hypothyroidism, unspecified: Secondary | ICD-10-CM

## 2011-08-14 DIAGNOSIS — K219 Gastro-esophageal reflux disease without esophagitis: Secondary | ICD-10-CM

## 2011-08-14 DIAGNOSIS — E669 Obesity, unspecified: Secondary | ICD-10-CM

## 2011-08-14 DIAGNOSIS — I10 Essential (primary) hypertension: Secondary | ICD-10-CM

## 2011-08-14 DIAGNOSIS — R5383 Other fatigue: Secondary | ICD-10-CM

## 2011-08-14 MED ORDER — ESOMEPRAZOLE MAGNESIUM 40 MG PO CPDR: 40.0000 mg | DELAYED_RELEASE_CAPSULE | Freq: Every day | ORAL | Status: DC

## 2011-08-14 MED ORDER — LEVOTHYROXINE SODIUM 50 MCG PO TABS: 50.0000 ug | ORAL_TABLET | Freq: Every day | ORAL | Status: DC

## 2011-08-14 MED ORDER — POTASSIUM CHLORIDE 8 MEQ PO TBCR: 8.0000 meq | EXTENDED_RELEASE_TABLET | Freq: Every day | ORAL | Status: DC

## 2011-08-14 MED ORDER — HYDROCHLOROTHIAZIDE 12.5 MG PO CAPS: 12.5000 mg | ORAL_CAPSULE | ORAL | Status: DC

## 2011-08-14 MED ORDER — DILTIAZEM HCL ER COATED BEADS 240 MG PO CP24: 240.0000 mg | ORAL_CAPSULE | Freq: Every day | ORAL | Status: DC

## 2011-08-14 NOTE — Progress Notes (Signed)
Subjective:    Patient ID: Christine [name], female    DOB: 1955/09/10, 56 y.o.   MRN: [id number]  HPI Weight gain noted Patient is followed for hypothyroidism hypertension gastroesophageal reflux she has morbid obesity and we had agreed to follow a diet and exercise program that would result in weight loss prior to this office visit but indeed she has gained weight.  We discussed in detail the implications of weight gain in the setting of hypertension gastroesophageal reflux and other potential comorbidities it may develop   Review of Systems  Constitutional: Negative for activity change, appetite change and fatigue.  HENT: Negative for ear pain, congestion, neck pain, postnasal drip and sinus pressure.   Eyes: Negative for redness and visual disturbance.  Respiratory: Negative for cough, shortness of breath and wheezing.   Gastrointestinal: Negative for abdominal pain and abdominal distention.  Genitourinary: Negative for dysuria, frequency and menstrual problem.  Musculoskeletal: Negative for myalgias, joint swelling and arthralgias.  Skin: Negative for rash and wound.  Neurological: Negative for dizziness, weakness and headaches.  Hematological: Negative for adenopathy. Does not bruise/bleed easily.  Psychiatric/Behavioral: Negative for sleep disturbance and decreased concentration.   Past Medical History  Diagnosis Date  . ALLERGIC RHINITIS   . GERD (gastroesophageal reflux disease)   . Hypertension   . Hypothyroidism   . Hx of colonic polyps     History   Social History  . Marital Status: Married    Spouse Name: N/A    Number of Children: N/A  . Years of Education: N/A   Occupational History  . Not on file.   Social History Main Topics  . Smoking status: Never Smoker   . Smokeless tobacco: Not on file  . Alcohol Use: Yes  . Drug Use: No  . Sexually Active:    Other Topics Concern  . Not on file   Social History Narrative  . No narrative on file    Past  Surgical History  Procedure Date  . Tonsillectomy   . Caesarean section     x 1    Family History  Problem Relation Age of Onset  . Colon cancer Mother     family hx of colon ca 1st degree relative <60  . Coronary artery disease Father     family hx of CAD female 1st degree relative <50    Allergies  Allergen Reactions  . Sulfonamide Derivatives     Current Outpatient Prescriptions on File Prior to Visit  Medication Sig Dispense Refill  . diltiazem (CARDIZEM CD) 240 MG 24 hr capsule TAKE ONE CAPSULE BY MOUTH EVERY DAY  90 capsule  2  . esomeprazole (NEXIUM) 40 MG packet Take 40 mg by mouth daily.        . hydrochlorothiazide (MICROZIDE) 12.5 MG capsule TAKE ONE CAPSULE BY MOUTH EVERY DAY  90 capsule  0  . latanoprost (XALATAN) 0.005 % ophthalmic solution daily.        Marland Kitchen levothyroxine (SYNTHROID, LEVOTHROID) 50 MCG tablet TAKE 1 TABLET BY MOUTH EVERYDAY  90 tablet  2  . potassium chloride (KLOR-CON) 8 MEQ CR tablet Take 8 mEq by mouth daily.          BP 144/84  Pulse 76  Temp 98.2 F (36.8 C)  Resp 16  Ht 5\' 4"  (1.626 m)  Wt 230 lb (104.327 kg)  BMI 39.48 kg/m2       Objective:   Physical Exam  Nursing note and vitals reviewed. Constitutional: She is oriented  to person, place, and time. She appears well-developed and well-nourished. No distress.  HENT:  Head: Normocephalic and atraumatic.  Right Ear: External ear normal.  Left Ear: External ear normal.  Nose: Nose normal.  Mouth/Throat: Oropharynx is clear and moist.  Eyes: Conjunctivae and EOM are normal. Pupils are equal, round, and reactive to light.  Neck: Normal range of motion. Neck supple. No JVD present. No tracheal deviation present. No thyromegaly present.  Cardiovascular: Normal rate, regular rhythm, normal heart sounds and intact distal pulses.   No murmur heard. Pulmonary/Chest: Effort normal and breath sounds normal. She has no wheezes. She exhibits no tenderness.  Abdominal: Soft. Bowel sounds are  normal.  Musculoskeletal: Normal range of motion. She exhibits no edema and no tenderness.  Lymphadenopathy:    She has no cervical adenopathy.  Neurological: She is alert and oriented to person, place, and time. She has normal reflexes. No cranial nerve deficit.  Skin: Skin is warm and dry. She is not diaphoretic.  Psychiatric: She has a normal mood and affect. Her behavior is normal.          Assessment & Plan:  Weight gain in the setting of morbid obesity  Weight loss counseling was pursued today during our office visit with discussion of diet exercise and setting goals for weight loss  I have spent more than 30 minutes examining this patient face-to-face of which over half was spent in counseling  Blood pressure is stable but has been affected by her weight gain.  Hypothyroidism is a constant diagnosis and T3-T4 TSH will be monitored.

## 2011-09-13 ENCOUNTER — Encounter (HOSPITAL_BASED_OUTPATIENT_CLINIC_OR_DEPARTMENT_OTHER): Payer: Self-pay | Admitting: *Deleted

## 2011-09-13 ENCOUNTER — Emergency Department (HOSPITAL_BASED_OUTPATIENT_CLINIC_OR_DEPARTMENT_OTHER)
Admission: EM | Admit: 2011-09-13 | Discharge: 2011-09-13 | Disposition: A | Discharge status: Home / Self Care | Payer: BC Managed Care – PPO | Attending: Emergency Medicine | Admitting: Emergency Medicine

## 2011-09-13 DIAGNOSIS — E039 Hypothyroidism, unspecified: Secondary | ICD-10-CM | POA: Insufficient documentation

## 2011-09-13 DIAGNOSIS — K219 Gastro-esophageal reflux disease without esophagitis: Secondary | ICD-10-CM | POA: Insufficient documentation

## 2011-09-13 DIAGNOSIS — K529 Noninfective gastroenteritis and colitis, unspecified: Secondary | ICD-10-CM

## 2011-09-13 DIAGNOSIS — K5289 Other specified noninfective gastroenteritis and colitis: Secondary | ICD-10-CM | POA: Insufficient documentation

## 2011-09-13 DIAGNOSIS — I10 Essential (primary) hypertension: Secondary | ICD-10-CM | POA: Insufficient documentation

## 2011-09-13 DIAGNOSIS — Z79899 Other long term (current) drug therapy: Secondary | ICD-10-CM | POA: Insufficient documentation

## 2011-09-13 DIAGNOSIS — R109 Unspecified abdominal pain: Secondary | ICD-10-CM | POA: Insufficient documentation

## 2011-09-13 LAB — DIFFERENTIAL
Basophils Relative: 0 % (ref 0–1)
Lymphocytes Relative: 8 % — ABNORMAL LOW (ref 12–46)
Monocytes Absolute: 0.5 10*3/uL (ref 0.1–1.0)
Monocytes Relative: 4 % (ref 3–12)
Neutro Abs: 10.5 10*3/uL — ABNORMAL HIGH (ref 1.7–7.7)

## 2011-09-13 LAB — COMPREHENSIVE METABOLIC PANEL
BUN: 10 mg/dL (ref 6–23)
CO2: 29 mEq/L (ref 19–32)
Chloride: 98 mEq/L (ref 96–112)
Creatinine, Ser: 0.7 mg/dL (ref 0.50–1.10)
GFR calc non Af Amer: >90 mL/min (ref >90)
Total Bilirubin: 0.3 mg/dL (ref 0.3–1.2)

## 2011-09-13 LAB — URINALYSIS, ROUTINE W REFLEX MICROSCOPIC
Bilirubin Urine: NEGATIVE
Glucose, UA: NEGATIVE mg/dL
Hgb urine dipstick: NEGATIVE
Ketones, ur: 15 mg/dL — AB
Leukocytes, UA: NEGATIVE
pH: 8 (ref 5.0–8.0)

## 2011-09-13 LAB — LIPASE, BLOOD: Lipase: 24 U/L (ref 11–59)

## 2011-09-13 LAB — CBC
HCT: 37.6 % (ref 36.0–46.0)
Hemoglobin: 12.7 g/dL (ref 12.0–15.0)
MCHC: 33.8 g/dL (ref 30.0–36.0)

## 2011-09-13 MED ORDER — SODIUM CHLORIDE 0.9 % IV BOLUS (SEPSIS)
1000.0000 mL | Freq: Once | INTRAVENOUS | Status: AC
  Administered 2011-09-13: 1000 mL via INTRAVENOUS

## 2011-09-13 MED ORDER — ONDANSETRON HCL 4 MG/2ML IJ SOLN
INTRAMUSCULAR | Status: AC
  Filled 2011-09-13: qty 2

## 2011-09-13 MED ORDER — ONDANSETRON HCL 4 MG/2ML IJ SOLN: 4.0000 mg | Freq: Once | INTRAMUSCULAR | Status: DC

## 2011-09-13 MED ORDER — ONDANSETRON HCL 4 MG/2ML IJ SOLN
4.0000 mg | Freq: Once | INTRAMUSCULAR | Status: AC
  Administered 2011-09-13: 4 mg via INTRAVENOUS

## 2011-09-13 MED ORDER — FENTANYL CITRATE 0.05 MG/ML IJ SOLN
100.0000 ug | Freq: Once | INTRAMUSCULAR | Status: AC
  Administered 2011-09-13: 50 ug via INTRAVENOUS
  Filled 2011-09-13: qty 2

## 2011-09-13 MED ORDER — ONDANSETRON 4 MG PO TBDP: 4.0000 mg | ORAL_TABLET | Freq: Three times a day (TID) | ORAL | Status: AC | PRN

## 2011-09-13 MED ORDER — ONDANSETRON HCL 4 MG/2ML IJ SOLN
4.0000 mg | Freq: Once | INTRAMUSCULAR | Status: AC
  Administered 2011-09-13: 4 mg via INTRAVENOUS
  Filled 2011-09-13: qty 2

## 2011-09-13 MED ORDER — FENTANYL CITRATE 0.05 MG/ML IJ SOLN
100.0000 ug | Freq: Once | INTRAMUSCULAR | Status: AC
  Administered 2011-09-13: 100 ug via INTRAVENOUS
  Filled 2011-09-13: qty 2

## 2011-09-13 NOTE — ED Notes (Signed)
Attempted x 3 to start IV, unable to obtain- another RN to try.

## 2011-09-13 NOTE — ED Provider Notes (Signed)
History    Scribed for Christine Shi, MD, the patient was seen in room MH08/MH08. This chart was scribed by Katha Cabal.   CSN: [id number]  Arrival date & time 09/13/11  1635   First MD Initiated Contact with Patient 09/13/11 1805      Chief Complaint  Patient presents with  . Abdominal Pain    (Consider location/radiation/quality/duration/timing/severity/associated sxs/prior treatment) Patient is a 56 y.o. female presenting with abdominal pain. The history is provided by the patient and a relative. No language interpreter was used.  Abdominal Pain The primary symptoms of the illness include abdominal pain, nausea, vomiting and diarrhea. The primary symptoms of the illness do not include fever or hematochezia. Episode onset: at 12 PM. The onset of the illness was sudden. The problem has not changed since onset. The abdominal pain is located in the LLQ and RLQ. The abdominal pain radiates to the back and left flank. The abdominal pain is relieved by nothing.  Additional symptoms associated with the illness include back pain. Significant associated medical issues include GERD.   Abdominal pain was preceded by diarrhea followed by vomiting and lower abdominal pain.  There has been no fever.  Patient took 2 doses of  Pepto Bismol PTA.  Family member reports he had diarrhea about a week ago that has resolved.  There is no abdominal surgical history of cholecystectomy or appendectomy.    Past Medical History  Diagnosis Date  . ALLERGIC RHINITIS   . GERD (gastroesophageal reflux disease)   . Hypertension   . Hypothyroidism   . Hx of colonic polyps     Past Surgical History  Procedure Date  . Tonsillectomy   . Caesarean section     x 1    Family History  Problem Relation Age of Onset  . Colon cancer Mother     family hx of colon ca 1st degree relative <60  . Coronary artery disease Father     family hx of CAD female 1st degree relative <50    History  Substance Use Topics    . Smoking status: Never Smoker   . Smokeless tobacco: Not on file  . Alcohol Use: Yes    OB History    Grav Para Term Preterm Abortions TAB SAB Ect Mult Living                  Review of Systems  Constitutional: Negative for fever.  Gastrointestinal: Positive for nausea, vomiting, abdominal pain and diarrhea. Negative for hematochezia.  Musculoskeletal: Positive for back pain.   Remaining review of systems negative except as noted in the HPI.   Allergies  Sulfonamide derivatives  Home Medications   Current Outpatient Rx  Name Route Sig Dispense Refill  . ASPIRIN 81 MG PO TABS Oral Take 81 mg by mouth daily.    Marland Kitchen DILTIAZEM HCL ER COATED BEADS 240 MG PO CP24 Oral Take 1 capsule (240 mg total) by mouth daily. 90 capsule 2  . HYDROCHLOROTHIAZIDE 12.5 MG PO CAPS Oral Take 1 capsule (12.5 mg total) by mouth every morning. 90 capsule 3  . LATANOPROST 0.005 % OP SOLN Both Eyes Place 1 drop into both eyes at bedtime.     Marland Kitchen LEVOTHYROXINE SODIUM 50 MCG PO TABS Oral Take 1 tablet (50 mcg total) by mouth daily. 90 tablet 2  . POTASSIUM CHLORIDE 8 MEQ PO TBCR Oral Take 1 tablet (8 mEq total) by mouth daily. 90 tablet 3  . ONDANSETRON 4 MG PO TBDP  Oral Take 1 tablet (4 mg total) by mouth every 8 (eight) hours as needed for nausea. 20 tablet 0    BP 134/80  Pulse 87  Temp(Src) 97.8 F (36.6 C) (Oral)  Resp 16  Ht 5\' 4"  (1.626 m)  Wt 220 lb (99.791 kg)  BMI 37.76 kg/m2  SpO2 93%  Physical Exam  Nursing note and vitals reviewed. Constitutional: She is oriented to person, place, and time. She appears well-developed and well-nourished. No distress.  HENT:  Head: Normocephalic and atraumatic.       Mucous membranes sticky not dry   Eyes: Pupils are equal, round, and reactive to light.  Neck: Normal range of motion.  Cardiovascular: Normal rate and intact distal pulses.   Pulmonary/Chest: No respiratory distress.  Abdominal: Soft. Normal appearance. She exhibits no distension. There  is no rebound and no guarding.       Diffuse pain, increased in LLQ  Musculoskeletal: Normal range of motion.  Neurological: She is alert and oriented to person, place, and time. No cranial nerve deficit.  Skin: Skin is warm and dry. No rash noted.  Psychiatric: She has a normal mood and affect. Her behavior is normal.    ED Course  Procedures (including critical care time)   DIAGNOSTIC STUDIES: Oxygen Saturation is 98% on room air, normal by my interpretation.     COORDINATION OF CARE: 6:20 PM  Physical exam complete.  IV fluids 6:45 PM  Zofran and Fentanyl ordered.  8:30 PM  Fentanyl injection ordered.     LABS / RADIOLOGY:   Labs Reviewed  URINALYSIS, ROUTINE W REFLEX MICROSCOPIC - Abnormal; Notable for the following:    Ketones, ur 15 (*)    All other components within normal limits  CBC - Abnormal; Notable for the following:    WBC 12.0 (*)    All other components within normal limits  DIFFERENTIAL - Abnormal; Notable for the following:    Neutrophils Relative 87 (*)    Neutro Abs 10.5 (*)    Lymphocytes Relative 8 (*)    All other components within normal limits  COMPREHENSIVE METABOLIC PANEL - Abnormal; Notable for the following:    Potassium 3.3 (*)    Glucose, Bld 147 (*)    All other components within normal limits  LIPASE, BLOOD          MDM  I personally performed the services described in this documentation, which was scribed in my presence. The recorded information has been reviewed and considered.            MEDICATIONS GIVEN IN THE E.D.  Fentayl and odansetron  After treatment in the ED the patient feels back to baseline and wants to go home.   IMPRESSION: 1. Gastroenteritis               Christine Shi, MD 09/15/11 [phone]

## 2011-09-13 NOTE — ED Notes (Signed)
Pt states she has had low abd, left flank pain. N/V/D onset earlier today.

## 2011-09-13 NOTE — ED Notes (Signed)
Simms, RN attempted x 3 to start IV, unable to obtain.

## 2011-11-14 ENCOUNTER — Ambulatory Visit: Payer: BC Managed Care – PPO | Admitting: Internal Medicine

## 2011-11-17 ENCOUNTER — Encounter: Payer: Self-pay | Admitting: Internal Medicine

## 2011-11-17 ENCOUNTER — Ambulatory Visit (INDEPENDENT_AMBULATORY_CARE_PROVIDER_SITE_OTHER): Payer: BC Managed Care – PPO | Admitting: Internal Medicine

## 2011-11-17 VITALS — BP 130/80 | HR 72 | Temp 98.6°F | Resp 16 | Ht 64.0 in | Wt 224.0 lb

## 2011-11-17 DIAGNOSIS — E876 Hypokalemia: Secondary | ICD-10-CM

## 2011-11-17 DIAGNOSIS — E039 Hypothyroidism, unspecified: Secondary | ICD-10-CM

## 2011-11-17 DIAGNOSIS — M1710 Unilateral primary osteoarthritis, unspecified knee: Secondary | ICD-10-CM

## 2011-11-17 DIAGNOSIS — I10 Essential (primary) hypertension: Secondary | ICD-10-CM

## 2011-11-17 MED ORDER — METHYLPREDNISOLONE ACETATE 40 MG/ML IJ SUSP: 40.0000 mg | Freq: Once | INTRAMUSCULAR | Status: DC

## 2011-11-17 NOTE — Progress Notes (Signed)
Subjective:    Patient ID: Christine [name], female    DOB: 1956-01-06, 56 y.o.   MRN: [id number]  HPI The patient is a 56 year old female who is followed for hypothyroidism hypertension and a history of gastroesophageal reflux.  She was seen in February in the emergency room for acute viral gastroenteritis with diarrhea nausea and vomiting.  Her laboratory work at that time was positive for an elevated white count consistent with the infection and she had a basic metabolic panel which showed a low potassium consistent with the diarrhea.  She been treated for low potassium before and we want to make sure that her potassium was able to be replaced by dietary sources so we will do a basic metabolic panel today otherwise she appears to be stable blood pressure stable she appears healthy  She has marked swelling in her right knee most probably from meniscal tear she states when she does more activity but it is more likely to be sore and swollen.  She has had a prior injection in the knee approximately 6 months ago.   Review of Systems  Constitutional: Negative for activity change, appetite change and fatigue.  HENT: Negative for ear pain, congestion, neck pain, postnasal drip and sinus pressure.   Eyes: Negative for redness and visual disturbance.  Respiratory: Negative for cough, shortness of breath and wheezing.   Cardiovascular: Positive for leg swelling.  Gastrointestinal: Negative for abdominal pain and abdominal distention.  Genitourinary: Negative for dysuria, frequency and menstrual problem.  Musculoskeletal: Positive for myalgias, joint swelling, arthralgias and gait problem.  Skin: Negative for rash and wound.  Neurological: Negative for dizziness, weakness and headaches.  Hematological: Negative for adenopathy. Does not bruise/bleed easily.  Psychiatric/Behavioral: Negative for sleep disturbance and decreased concentration.   Past Medical History  Diagnosis Date  . ALLERGIC  RHINITIS   . GERD (gastroesophageal reflux disease)   . Hypertension   . Hypothyroidism   . Hx of colonic polyps     History   Social History  . Marital Status: Married    Spouse Name: N/A    Number of Children: N/A  . Years of Education: N/A   Occupational History  . Not on file.   Social History Main Topics  . Smoking status: Never Smoker   . Smokeless tobacco: Not on file  . Alcohol Use: Yes  . Drug Use: No  . Sexually Active:    Other Topics Concern  . Not on file   Social History Narrative  . No narrative on file    Past Surgical History  Procedure Date  . Tonsillectomy   . Caesarean section     x 1    Family History  Problem Relation Age of Onset  . Colon cancer Mother     family hx of colon ca 1st degree relative <60  . Coronary artery disease Father     family hx of CAD female 1st degree relative <50    Allergies  Allergen Reactions  . Sulfonamide Derivatives Hives, Itching and Swelling    Current Outpatient Prescriptions on File Prior to Visit  Medication Sig Dispense Refill  . aspirin 81 MG tablet Take 81 mg by mouth daily.      Marland Kitchen diltiazem (CARDIZEM CD) 240 MG 24 hr capsule Take 1 capsule (240 mg total) by mouth daily.  90 capsule  2  . hydrochlorothiazide (MICROZIDE) 12.5 MG capsule Take 1 capsule (12.5 mg total) by mouth every morning.  90 capsule  3  .  latanoprost (XALATAN) 0.005 % ophthalmic solution Place 1 drop into both eyes at bedtime.       Marland Kitchen levothyroxine (SYNTHROID, LEVOTHROID) 50 MCG tablet Take 1 tablet (50 mcg total) by mouth daily.  90 tablet  2  . potassium chloride (KLOR-CON) 8 MEQ CR tablet Take 1 tablet (8 mEq total) by mouth daily.  90 tablet  3   No current facility-administered medications on file prior to visit.    BP 130/80  Pulse 72  Temp 98.6 F (37 C)  Resp 16  Ht 5\' 4"  (1.626 m)  Wt 224 lb (101.606 kg)  BMI 38.45 kg/m2       Objective:   Physical Exam  Nursing note and vitals reviewed. Constitutional:  She is oriented to person, place, and time. She appears well-developed and well-nourished. No distress.  HENT:  Head: Normocephalic and atraumatic.  Right Ear: External ear normal.  Left Ear: External ear normal.  Nose: Nose normal.  Mouth/Throat: Oropharynx is clear and moist.  Eyes: Conjunctivae and EOM are normal. Pupils are equal, round, and reactive to light.  Neck: Normal range of motion. Neck supple. No JVD present. No tracheal deviation present. No thyromegaly present.  Cardiovascular: Normal rate, regular rhythm, normal heart sounds and intact distal pulses.   No murmur heard. Pulmonary/Chest: Effort normal and breath sounds normal. She has no wheezes. She exhibits no tenderness.  Abdominal: Soft. Bowel sounds are normal.  Musculoskeletal: Normal range of motion. She exhibits no edema and no tenderness.  Lymphadenopathy:    She has no cervical adenopathy.  Neurological: She is alert and oriented to person, place, and time. She has normal reflexes. No cranial nerve deficit.  Skin: Skin is warm and dry. She is not diaphoretic.  Psychiatric: She has a normal mood and affect. Her behavior is normal.          Assessment & Plan:  We will monitor basic metabolic panel to make sure that her potassium is normal and her creatinine has returned to normal after viral gastroenteritis.  Thyroid also will be checked at the time we are drawing blood.  She has significant pain in her right knee with meniscal tear and cyst soft tissue swelling consistent with acute on chronic knee arthritis.    Informed consent obtained and the patient's knee was prepped with betadine. Local anesthesia was obtained with topical spray. Then 40 mg of Depo-Medrol and 1/2 cc of lidocaine was injected into the joint space. The patient tolerated the procedure without complications. Post injection care discussed with patient. ( charged)

## 2011-11-25 ENCOUNTER — Telehealth: Payer: Self-pay | Admitting: Internal Medicine

## 2011-11-25 NOTE — Telephone Encounter (Signed)
Pt informed

## 2011-11-25 NOTE — Telephone Encounter (Signed)
Patient called to inquire about labs results. Patient was informed that the results are not available and she would get a call with results when available. Please assist.

## 2012-01-07 ENCOUNTER — Encounter: Payer: Self-pay | Admitting: Gastroenterology

## 2012-02-03 ENCOUNTER — Encounter: Payer: Self-pay | Admitting: Gastroenterology

## 2012-02-03 ENCOUNTER — Ambulatory Visit (AMBULATORY_SURGERY_CENTER): Payer: BC Managed Care – PPO | Admitting: *Deleted

## 2012-02-03 VITALS — Ht 64.0 in | Wt 227.3 lb

## 2012-02-03 DIAGNOSIS — Z800 Family history of malignant neoplasm of digestive organs: Secondary | ICD-10-CM

## 2012-02-03 DIAGNOSIS — Z1211 Encounter for screening for malignant neoplasm of colon: Secondary | ICD-10-CM

## 2012-02-03 MED ORDER — MOVIPREP 100 G PO SOLR: 1.0000 | Freq: Once | ORAL | Status: DC

## 2012-02-12 ENCOUNTER — Telehealth: Payer: Self-pay | Admitting: Internal Medicine

## 2012-02-12 NOTE — Telephone Encounter (Signed)
Caller: Isella/Patient; PCP: Darryll Capers; CB#: [phone]; ; ; Call regarding Back Pain;   Patient states she developed pain and swelling in right shoulder, right side of neck 02/06/12. Patient states she developed lower back pain radiating down left leg to knee after lifting furniture 01/31/12. Denies numbness or tingling. Patient states she has tried Ibuprofen and Aleve without relief. States pain at a "5-6" on 1-10 scale. States pain increases with moving/bending. Afebrile. Denies urnary sx. Triage per Back Sx Protocol. No emergent sx identified. Care advice given per guidelines. Patient advised local heat, may take Ibuprofen 800mg . q 8 hours prn for pain, take with food. Call back parameters reviewed. Patient verbalizes understanding. No appts. available in Epic EHR for Dr. Lovell Sheehan or Adline Mango for  02/12/12. Appt. scheduled for 02/13/12 0900 with Adline Mango. Call back parameters reviewed. Patient verbalizes understanding.

## 2012-02-13 ENCOUNTER — Ambulatory Visit (INDEPENDENT_AMBULATORY_CARE_PROVIDER_SITE_OTHER): Payer: BC Managed Care – PPO | Admitting: Family

## 2012-02-13 ENCOUNTER — Encounter: Payer: Self-pay | Admitting: Family

## 2012-02-13 VITALS — BP 102/70 | Temp 98.6°F | Wt 228.0 lb

## 2012-02-13 DIAGNOSIS — M5412 Radiculopathy, cervical region: Secondary | ICD-10-CM

## 2012-02-13 DIAGNOSIS — M545 Low back pain: Secondary | ICD-10-CM

## 2012-02-13 DIAGNOSIS — M25511 Pain in right shoulder: Secondary | ICD-10-CM

## 2012-02-13 DIAGNOSIS — M25519 Pain in unspecified shoulder: Secondary | ICD-10-CM

## 2012-02-13 MED ORDER — PREDNISONE 20 MG PO TABS: ORAL_TABLET | ORAL | Status: AC

## 2012-02-13 MED ORDER — TRAMADOL HCL 50 MG PO TABS: 50.0000 mg | ORAL_TABLET | Freq: Three times a day (TID) | ORAL | Status: AC | PRN

## 2012-02-13 NOTE — Progress Notes (Signed)
Subjective:    Patient ID: Christine [name], female    DOB: Jun 26, 1956, 56 y.o.   MRN: [id number]  HPI 56 year old African American female, nonsmoker, patient of Dr. Lovell Sheehan is in today with complaints of right shoulder and neck pain as well as low back pain x2 weeks. Pain came after moving furniture. Recent pain a 5/10, but comes and goes, describes it as a dull ache. She takes ibuprofen and Aleve that helps with the pain returns. 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   Respiratory: Negative.   Cardiovascular: Negative.   Musculoskeletal: Positive for back pain and arthralgias.       Right shoulder pain, neck pain, low back pain  Neurological: Negative for dizziness, weakness and light-headedness.  Psychiatric/Behavioral: Negative.    Past Medical History  Diagnosis Date  . ALLERGIC RHINITIS   . GERD (gastroesophageal reflux disease)   . Hypertension   . Hypothyroidism   . Hx of colonic polyps     History   Social History  . Marital Status: Married    Spouse Name: N/A    Number of Children: N/A  . Years of Education: N/A   Occupational History  . Not on file.   Social History Main Topics  . Smoking status: Never Smoker   . Smokeless tobacco: Former Neurosurgeon  . Alcohol Use: Yes     social  . Drug Use: No  . Sexually Active: Not on file   Other Topics Concern  . Not on file   Social History Narrative  . No narrative on file    Past Surgical History  Procedure Date  . Tonsillectomy   . Caesarean section     x 1  . Abdominal hysterectomy 06/1988  . Mastectomy 04/1987    right side    Family History  Problem Relation Age of Onset  . Colon cancer Mother     family hx of colon ca 1st degree relative <60  . Coronary artery disease Father     family hx of CAD female 1st degree relative <50  . Stomach cancer Neg Hx   . Rectal cancer Neg Hx   . Esophageal cancer Neg Hx     Allergies  Allergen  Reactions  . Sulfonamide Derivatives Hives, Itching and Swelling    Current Outpatient Prescriptions on File Prior to Visit  Medication Sig Dispense Refill  . aspirin 81 MG tablet Take 81 mg by mouth daily.      Marland Kitchen diltiazem (CARDIZEM CD) 240 MG 24 hr capsule Take 1 capsule (240 mg total) by mouth daily.  90 capsule  2  . hydrochlorothiazide (MICROZIDE) 12.5 MG capsule Take 1 capsule (12.5 mg total) by mouth every morning.  90 capsule  3  . latanoprost (XALATAN) 0.005 % ophthalmic solution Place 1 drop into both eyes at bedtime.       Marland Kitchen levothyroxine (SYNTHROID, LEVOTHROID) 50 MCG tablet Take 1 tablet (50 mcg total) by mouth daily.  90 tablet  2  . NEXIUM 40 MG capsule       . potassium chloride (KLOR-CON) 8 MEQ CR tablet Take 1 tablet (8 mEq total) by mouth daily.  90 tablet  3  . MOVIPREP 100 G SOLR Take 1 kit (100 g total) by mouth once.  1 kit  0   Current Facility-Administered Medications on File Prior to Visit  Medication Dose Route Frequency Provider Last Rate Last Dose  .  methylPREDNISolone acetate (DEPO-MEDROL) injection 40 mg  40 mg Intra-articular Once Stacie Glaze, MD        BP 102/70  Temp 98.6 F (37 C) (Oral)  Wt 228 lb (103.42 kg)chart    Objective:   Physical Exam  Constitutional: She is oriented to person, place, and time. She appears well-developed and well-nourished.  Neck: Normal range of motion. Neck supple.  Cardiovascular: Normal rate, regular rhythm and normal heart sounds.   Pulmonary/Chest: Effort normal and breath sounds normal.  Abdominal: Soft. Bowel sounds are normal.  Musculoskeletal: Normal range of motion. She exhibits no edema and no tenderness.       Full range of motion of the neck with minimal pain elicited. Pain to palpation of the posterior right shoulder. The muscle is tight and tense. Negative straight leg raise. Tenderness to palpation of the low back just right of the stenosis process.  Neurological: She is alert and oriented to person,  place, and time. She has normal reflexes. She displays normal reflexes. A cranial nerve deficit is present. She exhibits normal muscle tone. Coordination normal.  Skin: Skin is warm and dry.  Psychiatric: She has a normal mood and affect.          Assessment & Plan:  Assessment: Cervical radiculopathy, right shoulder pain, low back pain  Plan: Prednisone 60x3, 40x3, 20x3. Tramadol 50 mg one tablet every 8 hours when necessary pain. Warned of drowsiness. Low back strengthening exercises given. Patient call the office if her symptoms worsen or persist. Recheck with PCP as scheduled and sooner when necessary.

## 2012-02-13 NOTE — Patient Instructions (Addendum)
Cervical Radiculopathy Cervical radiculopathy happens when a nerve in the neck is pinched or bruised by a slipped (herniated) disk or by arthritic changes in the bones of the cervical spine. This can occur due to an injury or as part of the normal aging process. Pressure on the cervical nerves can cause pain or numbness that runs from your neck all the way down into your arm and fingers. CAUSES  There are many possible causes, including:  Injury.   Muscle tightness in the neck from overuse.   Swollen, painful joints (arthritis).   Breakdown or degeneration in the bones and joints of the spine (spondylosis) due to aging.   Bone spurs that may develop near the cervical nerves.  SYMPTOMS  Symptoms include pain, weakness, or numbness in the affected arm and hand. Pain can be severe or irritating. Symptoms may be worse when extending or turning the neck. DIAGNOSIS  Your caregiver will ask about your symptoms and do a physical exam. He or she may test your strength and reflexes. X-rays, CT scans, and MRI scans may be needed in cases of injury or if the symptoms do not go away after a period of time. Electromyography (EMG) or nerve conduction testing may be done to study how your nerves and muscles are working. TREATMENT  Your caregiver may recommend certain exercises to help relieve your symptoms. Cervical radiculopathy can, and often does, get better with time and treatment. If your problems continue, treatment options may include:  Wearing a soft collar for short periods of time.   Physical therapy to strengthen the neck muscles.   Medicines, such as nonsteroidal anti-inflammatory drugs (NSAIDs), oral corticosteroids, or spinal injections.   Surgery. Different types of surgery may be done depending on the cause of your problems.  HOME CARE INSTRUCTIONS   Put ice on the affected area.   Put ice in a plastic bag.   Place a towel between your skin and the bag.   Leave the ice on for 15  to 20 minutes, 3 to 4 times a day or as directed by your caregiver.   Use a flat pillow when you sleep.   Only take over-the-counter or prescription medicines for pain, discomfort, or fever as directed by your caregiver.   If physical therapy was prescribed, follow your caregiver's directions.   If a soft collar was prescribed, use it as directed.  SEEK IMMEDIATE MEDICAL CARE IF:   Your pain gets much worse and cannot be controlled with medicines.   You have weakness or numbness in your hand, arm, face, or leg.   You have a high fever or a stiff, rigid neck.   You lose bowel or bladder control (incontinence).   You have trouble with walking, balance, or speaking.  MAKE SURE YOU:   Understand these instructions.   Will watch your condition.   Will get help right away if you are not doing well or get worse.  Document Released: 04/15/2001 Document Revised: 07/10/2011 Document Reviewed: 03/04/2011 [facility] Patient Information 2012 [place], [place].  Back Exercises Back exercises help treat and prevent back injuries. The goal of back exercises is to increase the strength of your abdominal and back muscles and the flexibility of your back. These exercises should be started when you no longer have back pain. Back exercises include:  Pelvic Tilt. Lie on your back with your knees bent. Tilt your pelvis until the lower part of your back is against the floor. Hold this position 5 to 10 sec  and repeat 5 to 10 times.   Knee to Chest. Pull first 1 knee up against your chest and hold for 20 to 30 seconds, repeat this with the other knee, and then both knees. This may be done with the other leg straight or bent, whichever feels better.   Sit-Ups or Curl-Ups. Bend your knees 90 degrees. Start with tilting your pelvis, and do a partial, slow sit-up, lifting your trunk only 30 to 45 degrees off the floor. Take at least 2 to 3 seconds for each sit-up. Do not do sit-ups with your knees out straight.  If partial sit-ups are difficult, simply do the above but with only tightening your abdominal muscles and holding it as directed.   Hip-Lift. Lie on your back with your knees flexed 90 degrees. Push down with your feet and shoulders as you raise your hips a couple inches off the floor; hold for 10 seconds, repeat 5 to 10 times.   Back arches. Lie on your stomach, propping yourself up on bent elbows. Slowly press on your hands, causing an arch in your low back. Repeat 3 to 5 times. Any initial stiffness and discomfort should lessen with repetition over time.   Shoulder-Lifts. Lie face down with arms beside your body. Keep hips and torso pressed to floor as you slowly lift your head and shoulders off the floor.  Do not overdo your exercises, especially in the beginning. Exercises may cause you some mild back discomfort which lasts for a few minutes; however, if the pain is more severe, or lasts for more than 15 minutes, do not continue exercises until you see your caregiver. Improvement with exercise therapy for back problems is slow.  See your caregivers for assistance with developing a proper back exercise program. Document Released: 08/28/2004 Document Revised: 07/10/2011 Document Reviewed: 07/21/2005 [facility] Patient Information 2012 [place], [place].

## 2012-02-17 ENCOUNTER — Encounter: Payer: Self-pay | Admitting: Gastroenterology

## 2012-02-17 ENCOUNTER — Ambulatory Visit (AMBULATORY_SURGERY_CENTER): Payer: BC Managed Care – PPO | Admitting: Gastroenterology

## 2012-02-17 VITALS — BP 186/89 | HR 71 | Temp 98.3°F | Resp 22 | Ht 64.0 in | Wt 227.0 lb

## 2012-02-17 DIAGNOSIS — D126 Benign neoplasm of colon, unspecified: Secondary | ICD-10-CM

## 2012-02-17 DIAGNOSIS — Z1211 Encounter for screening for malignant neoplasm of colon: Secondary | ICD-10-CM

## 2012-02-17 DIAGNOSIS — Z800 Family history of malignant neoplasm of digestive organs: Secondary | ICD-10-CM

## 2012-02-17 DIAGNOSIS — Z86010 Personal history of colonic polyps: Secondary | ICD-10-CM

## 2012-02-17 MED ORDER — SODIUM CHLORIDE 0.9 % IV SOLN: 500.0000 mL | INTRAVENOUS | Status: DC

## 2012-02-17 NOTE — Progress Notes (Signed)
Patient did not experience any of the following events: a burn prior to discharge; a fall within the facility; wrong site/side/patient/procedure/implant event; or a hospital transfer or hospital admission upon discharge from the facility. (G8907) Patient did not have preoperative order for IV antibiotic SSI prophylaxis. ([id number])  

## 2012-02-17 NOTE — Patient Instructions (Addendum)

## 2012-02-17 NOTE — Op Note (Signed)
[facility] [address] [place], [place]  [address]  COLONOSCOPY PROCEDURE REPORT  PATIENT:  Christine [name], Christine [name]  MR#:  [id number] BIRTHDATE:  04/19/1956, 55 yrs. old  GENDER:  female ENDOSCOPIST:  Judie Petit T. Russella Dar, MD, [facility]  PROCEDURE DATE:  02/17/2012 PROCEDURE:  Colonoscopy with snare polypectomy ASA CLASS:  Class II INDICATIONS:  1) surveillance and high-risk screening  2) family history of colon cancer: mtoher at 19  3) history of pre-cancerous (adenomatous) colon polyps: 1999 MEDICATIONS:   MAC sedation, administered by CRNA, propofol (Diprivan) 160 mg IV DESCRIPTION OF PROCEDURE:   After the risks benefits and alternatives of the procedure were thoroughly explained, informed consent was obtained.  Digital rectal exam was performed and revealed no abnormalities.   The LB CF-[id number] endoscope was introduced through the anus and advanced to the cecum, which was identified by both the appendix and ileocecal valve, without limitations.  The quality of the prep was good, using MoviPrep. The instrument was then slowly withdrawn as the colon was fully examined. <<PROCEDUREIMAGES>> FINDINGS:  Mild diverticulosis was found in the transverse to ascending colon.  Moderate diverticulosis was found in the sigmoid colon.  A sessile polyp was found in the sigmoid colon. It was 5 mm in size. Polyp was snared without cautery. Retrieval was successful. Otherwise normal colonoscopy without other polyps, masses, vascular ectasias, or inflammatory changes.  Retroflexed views in the rectum revealed no abnormalities.  The time to cecum =  2.33  minutes. The scope was then withdrawn (time =  11.5  min) from the patient and the procedure completed.  COMPLICATIONS:  None  ENDOSCOPIC IMPRESSION: 1) Mild diverticulosis in the transverse to ascending colon 2) Moderate diverticulosis in the sigmoid colon 3) 5 mm sessile polyp in the sigmoid colon  RECOMMENDATIONS: 1) Await pathology  results 2) High fiber diet with liberal fluid intake. 3) Repeat Colonoscopy in 5 years.  Venita Lick. Russella Dar, MD, [facility]  n. eSIGNED:   Venita Lick. Emmalynn Pinkham at 02/17/2012 01:53 PM  Montey Hora, [id number]

## 2012-02-18 ENCOUNTER — Telehealth: Payer: Self-pay | Admitting: *Deleted

## 2012-02-18 NOTE — Telephone Encounter (Signed)
No identifier, no answer. Message left to call if questions or concerns. 

## 2012-02-23 ENCOUNTER — Encounter: Payer: Self-pay | Admitting: Gastroenterology

## 2012-02-23 ENCOUNTER — Telehealth: Payer: Self-pay | Admitting: Internal Medicine

## 2012-02-23 DIAGNOSIS — M542 Cervicalgia: Secondary | ICD-10-CM

## 2012-02-23 NOTE — Telephone Encounter (Signed)
Per dr Lovell Sheehan- cervical spine mri without contrast

## 2012-02-23 NOTE — Telephone Encounter (Signed)
Caller: Christine [name]/Patient; PCP: Darryll Capers; CB#: [phone];  Call regarding Neck/Shoulder Pain, onset 07/05, seen in office 07/11.  Has completed course of Prednisone, sx continue on 07/22, minimal pain to back, but continues in neck and shoulder.  Pain is 6 out of 10.  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   Afebrile.   Emergent  sx negative.  Care advice per "Neck Swelling" with appt advised with 72h due to "new swelling that has persisted more than 14 days."  Call back parameters reviewed.   Declined appt with Dr. Lovell Sheehan on 07/24, declined offered appt with Lubertha Sayres.   PLEASE CALL Christine [name] AND ADVISE IF SHE NEEDS ANOTHER COURSE OF PREDNISONE OR IF SHE MUST BE SEEN AGAIN.  THANKS.

## 2012-02-28 ENCOUNTER — Ambulatory Visit
Admission: RE | Admit: 2012-02-28 | Discharge: 2012-02-28 | Disposition: A | Discharge status: Home / Self Care | Payer: BC Managed Care – PPO | Source: Ambulatory Visit | Attending: Internal Medicine | Admitting: Internal Medicine

## 2012-02-28 DIAGNOSIS — M542 Cervicalgia: Secondary | ICD-10-CM

## 2012-03-02 ENCOUNTER — Other Ambulatory Visit: Payer: Self-pay | Admitting: Internal Medicine

## 2012-03-02 DIAGNOSIS — M5030 Other cervical disc degeneration, unspecified cervical region: Secondary | ICD-10-CM

## 2012-03-08 ENCOUNTER — Ambulatory Visit: Payer: BC Managed Care – PPO | Attending: Internal Medicine | Admitting: Physical Therapy

## 2012-03-08 DIAGNOSIS — M6281 Muscle weakness (generalized): Secondary | ICD-10-CM | POA: Insufficient documentation

## 2012-03-08 DIAGNOSIS — IMO0001 Reserved for inherently not codable concepts without codable children: Secondary | ICD-10-CM | POA: Insufficient documentation

## 2012-03-08 DIAGNOSIS — M542 Cervicalgia: Secondary | ICD-10-CM | POA: Insufficient documentation

## 2012-03-11 ENCOUNTER — Ambulatory Visit: Payer: BC Managed Care – PPO | Admitting: Physical Therapy

## 2012-03-15 ENCOUNTER — Ambulatory Visit: Payer: BC Managed Care – PPO | Admitting: Physical Therapy

## 2012-03-18 ENCOUNTER — Ambulatory Visit: Payer: BC Managed Care – PPO | Admitting: Physical Therapy

## 2012-03-22 ENCOUNTER — Ambulatory Visit: Payer: BC Managed Care – PPO | Admitting: Physical Therapy

## 2012-03-25 ENCOUNTER — Ambulatory Visit: Payer: BC Managed Care – PPO | Admitting: Physical Therapy

## 2012-03-29 ENCOUNTER — Ambulatory Visit: Payer: BC Managed Care – PPO | Admitting: Physical Therapy

## 2012-03-30 ENCOUNTER — Other Ambulatory Visit: Payer: Self-pay | Admitting: Internal Medicine

## 2012-04-01 ENCOUNTER — Ambulatory Visit: Payer: BC Managed Care – PPO | Admitting: Physical Therapy

## 2012-04-08 ENCOUNTER — Ambulatory Visit: Payer: BC Managed Care – PPO | Attending: Internal Medicine | Admitting: Physical Therapy

## 2012-04-08 DIAGNOSIS — M542 Cervicalgia: Secondary | ICD-10-CM | POA: Insufficient documentation

## 2012-04-08 DIAGNOSIS — IMO0001 Reserved for inherently not codable concepts without codable children: Secondary | ICD-10-CM | POA: Insufficient documentation

## 2012-04-08 DIAGNOSIS — M6281 Muscle weakness (generalized): Secondary | ICD-10-CM | POA: Insufficient documentation

## 2012-04-09 ENCOUNTER — Ambulatory Visit (INDEPENDENT_AMBULATORY_CARE_PROVIDER_SITE_OTHER): Payer: BC Managed Care – PPO | Admitting: Family

## 2012-04-09 ENCOUNTER — Encounter: Payer: Self-pay | Admitting: Family

## 2012-04-09 VITALS — BP 126/80 | Temp 98.4°F | Wt 221.0 lb

## 2012-04-09 DIAGNOSIS — M5412 Radiculopathy, cervical region: Secondary | ICD-10-CM

## 2012-04-09 DIAGNOSIS — M25519 Pain in unspecified shoulder: Secondary | ICD-10-CM

## 2012-04-09 MED ORDER — CYCLOBENZAPRINE HCL 10 MG PO TABS: 10.0000 mg | ORAL_TABLET | Freq: Three times a day (TID) | ORAL | Status: AC | PRN

## 2012-04-09 MED ORDER — MELOXICAM 15 MG PO TABS: 15.0000 mg | ORAL_TABLET | Freq: Every day | ORAL | Status: DC

## 2012-04-09 NOTE — Progress Notes (Signed)
Subjective:    Patient ID: Christine [name], female    DOB: 07/27/1956, 56 y.o.   MRN: [id number]  HPI 56 year old African American female, nonsmoker, patient of Dr. Lovell Sheehan is in with persistent neck pain and right shoulder pain. She was treated with a course of prednisone but has in the past helped her symptoms, but this time it did not. She's had an MRI done and is undergoing physical therapy. She is seeing neurosurgery in the past who has recommended surgical intervention. She is requesting, off work so that she can heel. She works at a [employer] which aggravates her right shoulder and neck pain. She rates her pain a 6-7/10 that is worse after work. Has been taking Aleve and ibuprofen with no relief. Physical therapy has been beneficial.   Review of Systems  Constitutional: Negative.   Respiratory: Negative.   Cardiovascular: Negative.   Musculoskeletal: Positive for arthralgias. Negative for back pain.  Skin: Negative.   Neurological: Negative.  Negative for weakness.  Hematological: Negative.   Psychiatric/Behavioral: Negative.    Past Medical History  Diagnosis Date  . ALLERGIC RHINITIS   . GERD (gastroesophageal reflux disease)   . Hypertension   . Hypothyroidism   . Hx of colonic polyps     History   Social History  . Marital Status: Married    Spouse Name: N/A    Number of Children: N/A  . Years of Education: N/A   Occupational History  . Not on file.   Social History Main Topics  . Smoking status: Never Smoker   . Smokeless tobacco: Former Neurosurgeon  . Alcohol Use: Yes     social  . Drug Use: No  . Sexually Active: Not on file   Other Topics Concern  . Not on file   Social History Narrative  . No narrative on file    Past Surgical History  Procedure Date  . Tonsillectomy   . Caesarean section     x 1  . Abdominal hysterectomy 06/1988  . Mastectomy 04/1987    right side    Family History  Problem Relation Age of Onset  . Colon cancer  Mother     family hx of colon ca 1st degree relative <60  . Coronary artery disease Father     family hx of CAD female 1st degree relative <50  . Stomach cancer Neg Hx   . Rectal cancer Neg Hx   . Esophageal cancer Neg Hx     Allergies  Allergen Reactions  . Sulfonamide Derivatives Hives, Itching and Swelling    Current Outpatient Prescriptions on File Prior to Visit  Medication Sig Dispense Refill  . aspirin 81 MG tablet Take 81 mg by mouth daily.      Marland Kitchen diltiazem (CARDIZEM CD) 240 MG 24 hr capsule Take 1 capsule (240 mg total) by mouth daily.  90 capsule  2  . diltiazem (CARDIZEM CD) 240 MG 24 hr capsule TAKE ONE CAPSULE BY MOUTH EVERY DAY  90 capsule  2  . hydrochlorothiazide (MICROZIDE) 12.5 MG capsule Take 1 capsule (12.5 mg total) by mouth every morning.  90 capsule  3  . latanoprost (XALATAN) 0.005 % ophthalmic solution Place 1 drop into both eyes at bedtime.       Marland Kitchen levothyroxine (SYNTHROID, LEVOTHROID) 50 MCG tablet Take 1 tablet (50 mcg total) by mouth daily.  90 tablet  2  . NEXIUM 40 MG capsule Take 40 mg by mouth daily before breakfast.       .  potassium chloride (KLOR-CON) 8 MEQ CR tablet Take 1 tablet (8 mEq total) by mouth daily.  90 tablet  3    BP 126/80  Temp 98.4 F (36.9 C) (Oral)  Wt 221 lb (100.245 kg)chart     Objective:   Physical Exam  Constitutional: She is oriented to person, place, and time. She appears well-developed.  Neck: Normal range of motion. Neck supple.  Cardiovascular: Normal rate, regular rhythm and normal heart sounds.   Pulmonary/Chest: Effort normal and breath sounds normal.  Musculoskeletal:       Minimal tenderness elicited to palpation of the right shoulder. Mild swelling. Grip strength normal. No obvious deformity.  Neurological: She is alert and oriented to person, place, and time. She displays normal reflexes. No cranial nerve deficit. She exhibits normal muscle tone. Coordination normal.  Skin: Skin is warm and dry.    Psychiatric: She has a normal mood and affect.          Assessment & Plan:  Assessment: Right Shoulder Pain, Cervical Radiculopathy  Plan: Flexeril 10mg  three times a day. Mobic 15mg  once daily with food. Ice to the AA. Continue Physical Therapy. Out of work x 1 week. Call the office if symptoms worsen or persist. Recheck a schedule, when necessary.

## 2012-04-09 NOTE — Patient Instructions (Addendum)
Cervical Radiculopathy Cervical radiculopathy happens when a nerve in the neck is pinched or bruised by a slipped (herniated) disk or by arthritic changes in the bones of the cervical spine. This can occur due to an injury or as part of the normal aging process. Pressure on the cervical nerves can cause pain or numbness that runs from your neck all the way down into your arm and fingers. CAUSES  There are many possible causes, including:  Injury.   Muscle tightness in the neck from overuse.   Swollen, painful joints (arthritis).   Breakdown or degeneration in the bones and joints of the spine (spondylosis) due to aging.   Bone spurs that may develop near the cervical nerves.  SYMPTOMS  Symptoms include pain, weakness, or numbness in the affected arm and hand. Pain can be severe or irritating. Symptoms may be worse when extending or turning the neck. DIAGNOSIS  Your caregiver will ask about your symptoms and do a physical exam. He or she may test your strength and reflexes. X-rays, CT scans, and MRI scans may be needed in cases of injury or if the symptoms do not go away after a period of time. Electromyography (EMG) or nerve conduction testing may be done to study how your nerves and muscles are working. TREATMENT  Your caregiver may recommend certain exercises to help relieve your symptoms. Cervical radiculopathy can, and often does, get better with time and treatment. If your problems continue, treatment options may include:  Wearing a soft collar for short periods of time.   Physical therapy to strengthen the neck muscles.   Medicines, such as nonsteroidal anti-inflammatory drugs (NSAIDs), oral corticosteroids, or spinal injections.   Surgery. Different types of surgery may be done depending on the cause of your problems.  HOME CARE INSTRUCTIONS   Put ice on the affected area.   Put ice in a plastic bag.   Place a towel between your skin and the bag.   Leave the ice on for 15  to 20 minutes, 3 to 4 times a day or as directed by your caregiver.   Use a flat pillow when you sleep.   Only take over-the-counter or prescription medicines for pain, discomfort, or fever as directed by your caregiver.   If physical therapy was prescribed, follow your caregiver's directions.   If a soft collar was prescribed, use it as directed.  SEEK IMMEDIATE MEDICAL CARE IF:   Your pain gets much worse and cannot be controlled with medicines.   You have weakness or numbness in your hand, arm, face, or leg.   You have a high fever or a stiff, rigid neck.   You lose bowel or bladder control (incontinence).   You have trouble with walking, balance, or speaking.  MAKE SURE YOU:   Understand these instructions.   Will watch your condition.   Will get help right away if you are not doing well or get worse.  Document Released: 04/15/2001 Document Revised: 07/10/2011 Document Reviewed: 03/04/2011 Exit[employer] Patient Information 2012 [employer]. 

## 2012-04-12 ENCOUNTER — Encounter: Payer: BC Managed Care – PPO | Admitting: Physical Therapy

## 2012-04-13 ENCOUNTER — Ambulatory Visit: Payer: BC Managed Care – PPO | Admitting: Physical Therapy

## 2012-04-15 ENCOUNTER — Ambulatory Visit: Payer: BC Managed Care – PPO | Admitting: Physical Therapy

## 2012-04-16 ENCOUNTER — Encounter: Payer: Self-pay | Admitting: Family Medicine

## 2012-04-16 ENCOUNTER — Telehealth: Payer: Self-pay | Admitting: Internal Medicine

## 2012-04-16 NOTE — Telephone Encounter (Signed)
Caller: Christine [name]/Patient; Patient Name: Christine [name]; PCP: Darryll Capers (Adults only); Best Callback Phone Number: [phone]. Call regarding follow up for back/shoulder/arm pain. Was seen in office by Adline Mango, PA on 04/09/12. She was put out of work for one week and told to follow up today with an update to see if she needed to be put out of work for another week. Patient is asking to be put out of work another week. Patient is going to physical therapy twice a week for the same issue. Reports swelling has gone down since going to therapy. Currently taking Meloxicam and Cyclobenzaprine for pain and inflammation. Reports right hand tingling during therapy at times. Emergent symptom of "Localized numbness/tingling, weakness, prolonged morning stiffness or pain that persists despite treatment or self care" positive per Neck Pain or Injury guideline. Disposition: See provider within 72 hours. PLEASE CALL PATIENT AND LET HER KNOW IF SHE CAN HAVE ANOTHER WEEK OUT OF WORK OR IF SHE NEEDS TO COME IN AND BE SEEN FOR HER CONTINUING SYMPTOMS. Thanks.

## 2012-04-16 NOTE — Telephone Encounter (Signed)
Per Adline Mango, pt will have note to stay out another week.  Pt notified and note placed upfront for pick up.

## 2012-04-19 ENCOUNTER — Ambulatory Visit: Payer: BC Managed Care – PPO | Admitting: Physical Therapy

## 2012-04-22 ENCOUNTER — Ambulatory Visit: Payer: BC Managed Care – PPO | Admitting: Physical Therapy

## 2012-04-26 ENCOUNTER — Encounter: Payer: BC Managed Care – PPO | Admitting: Physical Therapy

## 2012-04-29 ENCOUNTER — Encounter: Payer: BC Managed Care – PPO | Admitting: Physical Therapy

## 2012-05-21 ENCOUNTER — Ambulatory Visit: Payer: BC Managed Care – PPO | Admitting: Internal Medicine

## 2012-06-18 ENCOUNTER — Ambulatory Visit (INDEPENDENT_AMBULATORY_CARE_PROVIDER_SITE_OTHER): Payer: BC Managed Care – PPO | Admitting: Internal Medicine

## 2012-06-18 VITALS — BP 140/76 | HR 76 | Temp 98.0°F | Resp 16 | Ht 64.0 in | Wt 230.0 lb

## 2012-06-18 DIAGNOSIS — E669 Obesity, unspecified: Secondary | ICD-10-CM

## 2012-06-18 DIAGNOSIS — I10 Essential (primary) hypertension: Secondary | ICD-10-CM

## 2012-06-18 NOTE — Progress Notes (Signed)
Subjective:    Patient ID: Christine [name], female    DOB: 1956-07-22, 56 y.o.   MRN: [id number]  HPI The patient follow up for HTN Weight gain previewed the paleo diet   Review of Systems  Constitutional: Negative for activity change, appetite change and fatigue.  HENT: Negative for ear pain, congestion, neck pain, postnasal drip and sinus pressure.   Eyes: Negative for redness and visual disturbance.  Respiratory: Negative for cough, shortness of breath and wheezing.   Gastrointestinal: Negative for abdominal pain and abdominal distention.  Genitourinary: Negative for dysuria, frequency and menstrual problem.  Musculoskeletal: Negative for myalgias, joint swelling and arthralgias.  Skin: Negative for rash and wound.  Neurological: Negative for dizziness, weakness and headaches.  Hematological: Negative for adenopathy. Does not bruise/bleed easily.  Psychiatric/Behavioral: Negative for sleep disturbance and decreased concentration.   Past Medical History  Diagnosis Date  . ALLERGIC RHINITIS   . GERD (gastroesophageal reflux disease)   . Hypertension   . Hypothyroidism   . Hx of colonic polyps     History   Social History  . Marital Status: Married    Spouse Name: N/A    Number of Children: N/A  . Years of Education: N/A   Occupational History  . Not on file.   Social History Main Topics  . Smoking status: Never Smoker   . Smokeless tobacco: Former Neurosurgeon  . Alcohol Use: Yes     Comment: social  . Drug Use: No  . Sexually Active: Not on file   Other Topics Concern  . Not on file   Social History Narrative  . No narrative on file    Past Surgical History  Procedure Date  . Tonsillectomy   . Caesarean section     x 1  . Abdominal hysterectomy 06/1988  . Mastectomy 04/1987    right side    Family History  Problem Relation Age of Onset  . Colon cancer Mother     family hx of colon ca 1st degree relative <60  . Coronary artery disease Father     family  hx of CAD female 1st degree relative <50  . Stomach cancer Neg Hx   . Rectal cancer Neg Hx   . Esophageal cancer Neg Hx     Allergies  Allergen Reactions  . Sulfonamide Derivatives Hives, Itching and Swelling    Current Outpatient Prescriptions on File Prior to Visit  Medication Sig Dispense Refill  . aspirin 81 MG tablet Take 81 mg by mouth daily.      Marland Kitchen diltiazem (CARDIZEM CD) 240 MG 24 hr capsule Take 1 capsule (240 mg total) by mouth daily.  90 capsule  2  . hydrochlorothiazide (MICROZIDE) 12.5 MG capsule Take 1 capsule (12.5 mg total) by mouth every morning.  90 capsule  3  . latanoprost (XALATAN) 0.005 % ophthalmic solution Place 1 drop into both eyes at bedtime.       Marland Kitchen levothyroxine (SYNTHROID, LEVOTHROID) 50 MCG tablet Take 1 tablet (50 mcg total) by mouth daily.  90 tablet  2  . meloxicam (MOBIC) 15 MG tablet Take 1 tablet (15 mg total) by mouth daily.  30 tablet  1  . NEXIUM 40 MG capsule Take 40 mg by mouth daily before breakfast.       . [DISCONTINUED] diltiazem (CARDIZEM CD) 240 MG 24 hr capsule TAKE ONE CAPSULE BY MOUTH EVERY DAY  90 capsule  2    BP 140/76  Pulse 76  Temp 98  F (36.7 C)  Resp 16  Ht 5\' 4"  (1.626 m)  Wt 230 lb (104.327 kg)  BMI 39.48 kg/m2        Objective:   Physical Exam  Nursing note and vitals reviewed. Constitutional: She is oriented to person, place, and time. She appears well-developed and well-nourished. No distress.       obese  HENT:  Head: Normocephalic and atraumatic.  Right Ear: External ear normal.  Left Ear: External ear normal.  Nose: Nose normal.  Mouth/Throat: Oropharynx is clear and moist.  Eyes: Conjunctivae normal and EOM are normal. Pupils are equal, round, and reactive to light.  Neck: Normal range of motion. Neck supple. No JVD present. No tracheal deviation present. No thyromegaly present.  Cardiovascular: Normal rate, regular rhythm, normal heart sounds and intact distal pulses.   No murmur  heard. Pulmonary/Chest: Effort normal and breath sounds normal. She has no wheezes. She exhibits no tenderness.  Abdominal: Soft. Bowel sounds are normal.  Musculoskeletal: Normal range of motion. She exhibits no edema and no tenderness.  Lymphadenopathy:    She has no cervical adenopathy.  Neurological: She is alert and oriented to person, place, and time. She has normal reflexes. No cranial nerve deficit.  Skin: Skin is warm and dry. She is not diaphoretic.  Psychiatric: She has a normal mood and affect. Her behavior is normal.          Assessment & Plan:  reviewed the gluten free diet Stable HTN obesity is the main issue  I have spent more than 30 minutes examining this patient face-to-face of which over half was spent in counseling  about diet and weight control

## 2012-07-10 ENCOUNTER — Other Ambulatory Visit: Payer: Self-pay | Admitting: Internal Medicine

## 2012-08-15 ENCOUNTER — Encounter: Payer: Self-pay | Admitting: Internal Medicine

## 2012-09-18 ENCOUNTER — Other Ambulatory Visit: Payer: Self-pay

## 2012-09-22 ENCOUNTER — Other Ambulatory Visit: Payer: Self-pay | Admitting: Internal Medicine

## 2012-09-23 ENCOUNTER — Encounter: Payer: Self-pay | Admitting: Family Medicine

## 2012-09-23 ENCOUNTER — Ambulatory Visit (INDEPENDENT_AMBULATORY_CARE_PROVIDER_SITE_OTHER): Payer: BC Managed Care – PPO | Admitting: Family Medicine

## 2012-09-23 VITALS — BP 118/92 | HR 98 | Temp 98.4°F | Wt 231.0 lb

## 2012-09-23 DIAGNOSIS — Z9010 Acquired absence of unspecified breast and nipple: Secondary | ICD-10-CM

## 2012-09-23 DIAGNOSIS — Q838 Other congenital malformations of breast: Secondary | ICD-10-CM

## 2012-09-23 DIAGNOSIS — N6459 Other signs and symptoms in breast: Secondary | ICD-10-CM

## 2012-09-23 DIAGNOSIS — R9230 Dense breasts, unspecified: Secondary | ICD-10-CM

## 2012-09-23 DIAGNOSIS — R922 Inconclusive mammogram: Secondary | ICD-10-CM

## 2012-09-23 DIAGNOSIS — Z853 Personal history of malignant neoplasm of breast: Secondary | ICD-10-CM

## 2012-09-23 DIAGNOSIS — Q839 Congenital malformation of breast, unspecified: Secondary | ICD-10-CM

## 2012-09-23 NOTE — Progress Notes (Signed)
Chief Complaint  Patient presents with  . left breast pain    nipple inverted;     HPI:  Acute visit for Breast pain -started: about 2 weeks ago - noticed dry skin around nipple -location: L nipple -symptoms: dry skin, feels like nipple looks different - a little discomfort in nipple as well, feels like breast tissue firmer in this area -denies: fevers, chills, weight loss -hx/Fhx: thinks has been a few years since had mammogram, Hx of R breast cancer s/p mastectomy 25 years ago - had been seeing gyn for her breast and pap exams, FH cousin with breast cancer   ROS: See pertinent positives and negatives per HPI.  Past Medical History  Diagnosis Date  . ALLERGIC RHINITIS   . GERD (gastroesophageal reflux disease)   . Hypertension   . Hypothyroidism   . Hx of colonic polyps     Family History  Problem Relation Age of Onset  . Colon cancer Mother     family hx of colon ca 1st degree relative <60  . Coronary artery disease Father     family hx of CAD female 1st degree relative <50  . Stomach cancer Neg Hx   . Rectal cancer Neg Hx   . Esophageal cancer Neg Hx     History   Social History  . Marital Status: Married    Spouse Name: N/A    Number of Children: N/A  . Years of Education: N/A   Social History Main Topics  . Smoking status: Never Smoker   . Smokeless tobacco: Former Neurosurgeon  . Alcohol Use: Yes     Comment: social  . Drug Use: No  . Sexually Active: None   Other Topics Concern  . None   Social History Narrative  . None    Current outpatient prescriptions:aspirin 81 MG tablet, Take 81 mg by mouth daily., Disp: , Rfl: ;  diltiazem (CARDIZEM CD) 240 MG 24 hr capsule, Take 1 capsule (240 mg total) by mouth daily., Disp: 90 capsule, Rfl: 2;  hydrochlorothiazide (MICROZIDE) 12.5 MG capsule, Take 1 capsule (12.5 mg total) by mouth every morning., Disp: 90 capsule, Rfl: 3 latanoprost (XALATAN) 0.005 % ophthalmic solution, Place 1 drop into both eyes at bedtime. ,  Disp: , Rfl: ;  levothyroxine (SYNTHROID, LEVOTHROID) 50 MCG tablet, TAKE 1 TABLET (50 MCG TOTAL) BY MOUTH DAILY., Disp: 90 tablet, Rfl: 2;  meloxicam (MOBIC) 15 MG tablet, Take 1 tablet (15 mg total) by mouth daily., Disp: 30 tablet, Rfl: 1;  NEXIUM 40 MG capsule, Take 40 mg by mouth daily before breakfast. , Disp: , Rfl:   EXAM:  Filed Vitals:   09/23/12 0930  BP: 118/92  Pulse: 98  Temp: 98.4 F (36.9 C)    Body mass index is 39.63 kg/(m^2).  GENERAL: vitals reviewed and listed above, alert, oriented, appears well hydrated and in no acute distress  HEENT: atraumatic, conjunttiva clear, no obvious abnormalities on inspection of external nose and ears  NECK: no obvious masses on inspection  BREAST: L nipple puckered appearance, density to tissue in area approx 6 cm in diameter surrounding L nipple, no erythema or discharge - R breast s/p mastectomy  MS: moves all extremities without noticeable abnormality  PSYCH: pleasant and cooperative, no obvious depression or anxiety  ASSESSMENT AND PLAN:  Discussed the following assessment and plan:  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 -Patient advised to return or notify a doctor immediately if symptoms worsen or persist or new concerns arise.  There are no Patient Instructions on file for this visit.   Christine Basque R.

## 2012-09-23 NOTE — Addendum Note (Signed)
Addended by: Terressa Koyanagi on: 09/23/2012 02:21 PM   Modules accepted: Orders

## 2012-09-28 ENCOUNTER — Ambulatory Visit
Admission: RE | Admit: 2012-09-28 | Discharge: 2012-09-28 | Disposition: A | Discharge status: Home / Self Care | Payer: BC Managed Care – PPO | Source: Ambulatory Visit | Attending: Family Medicine | Admitting: Family Medicine

## 2012-09-28 ENCOUNTER — Other Ambulatory Visit: Payer: Self-pay | Admitting: Family Medicine

## 2012-09-28 DIAGNOSIS — Z9010 Acquired absence of unspecified breast and nipple: Secondary | ICD-10-CM

## 2012-09-28 DIAGNOSIS — Q839 Congenital malformation of breast, unspecified: Secondary | ICD-10-CM

## 2012-09-28 DIAGNOSIS — R922 Inconclusive mammogram: Secondary | ICD-10-CM

## 2012-09-28 DIAGNOSIS — Z853 Personal history of malignant neoplasm of breast: Secondary | ICD-10-CM

## 2012-09-28 DIAGNOSIS — R9230 Dense breasts, unspecified: Secondary | ICD-10-CM

## 2012-09-29 ENCOUNTER — Other Ambulatory Visit: Payer: Self-pay | Admitting: Internal Medicine

## 2012-09-30 ENCOUNTER — Ambulatory Visit
Admission: RE | Admit: 2012-09-30 | Discharge: 2012-09-30 | Disposition: A | Discharge status: Home / Self Care | Payer: BC Managed Care – PPO | Source: Ambulatory Visit | Attending: Family Medicine | Admitting: Family Medicine

## 2012-09-30 ENCOUNTER — Other Ambulatory Visit: Payer: Self-pay | Admitting: Family Medicine

## 2012-09-30 DIAGNOSIS — Z9010 Acquired absence of unspecified breast and nipple: Secondary | ICD-10-CM

## 2012-09-30 DIAGNOSIS — Z853 Personal history of malignant neoplasm of breast: Secondary | ICD-10-CM

## 2012-09-30 DIAGNOSIS — C50919 Malignant neoplasm of unspecified site of unspecified female breast: Secondary | ICD-10-CM

## 2012-09-30 DIAGNOSIS — Q839 Congenital malformation of breast, unspecified: Secondary | ICD-10-CM

## 2012-09-30 DIAGNOSIS — R9230 Dense breasts, unspecified: Secondary | ICD-10-CM

## 2012-09-30 DIAGNOSIS — R922 Inconclusive mammogram: Secondary | ICD-10-CM

## 2012-09-30 HISTORY — DX: Malignant neoplasm of unspecified site of unspecified female breast: C50.919

## 2012-10-01 ENCOUNTER — Ambulatory Visit
Admission: RE | Admit: 2012-10-01 | Discharge: 2012-10-01 | Disposition: A | Discharge status: Home / Self Care | Payer: BC Managed Care – PPO | Source: Ambulatory Visit | Attending: Family Medicine | Admitting: Family Medicine

## 2012-10-01 DIAGNOSIS — Z853 Personal history of malignant neoplasm of breast: Secondary | ICD-10-CM

## 2012-10-01 DIAGNOSIS — Z9010 Acquired absence of unspecified breast and nipple: Secondary | ICD-10-CM

## 2012-10-01 DIAGNOSIS — Q839 Congenital malformation of breast, unspecified: Secondary | ICD-10-CM

## 2012-10-01 DIAGNOSIS — R922 Inconclusive mammogram: Secondary | ICD-10-CM

## 2012-10-05 ENCOUNTER — Encounter (INDEPENDENT_AMBULATORY_CARE_PROVIDER_SITE_OTHER): Payer: Self-pay | Admitting: Surgery

## 2012-10-05 ENCOUNTER — Other Ambulatory Visit (INDEPENDENT_AMBULATORY_CARE_PROVIDER_SITE_OTHER): Payer: Self-pay

## 2012-10-05 ENCOUNTER — Ambulatory Visit (INDEPENDENT_AMBULATORY_CARE_PROVIDER_SITE_OTHER): Payer: BC Managed Care – PPO | Admitting: Surgery

## 2012-10-05 ENCOUNTER — Telehealth (INDEPENDENT_AMBULATORY_CARE_PROVIDER_SITE_OTHER): Payer: Self-pay | Admitting: General Surgery

## 2012-10-05 VITALS — BP 140/88 | HR 88 | Temp 97.4°F | Resp 14 | Ht 64.0 in | Wt 222.0 lb

## 2012-10-05 DIAGNOSIS — C50412 Malignant neoplasm of upper-outer quadrant of left female breast: Secondary | ICD-10-CM | POA: Insufficient documentation

## 2012-10-05 DIAGNOSIS — C50912 Malignant neoplasm of unspecified site of left female breast: Secondary | ICD-10-CM

## 2012-10-05 DIAGNOSIS — C50919 Malignant neoplasm of unspecified site of unspecified female breast: Secondary | ICD-10-CM

## 2012-10-05 NOTE — Progress Notes (Signed)
Patient ID: Christine [name], female   DOB: [birth year], 56 y.o.   MRN: [id number]  No chief complaint on file.   HPI Christine [name] is a 57 y.o. female.  Patient sent at the request of Dr. Zella Ball for a left breast cancer. Over the last couple of months the patient's noticed that her nipple has become more itchy and retracted. She will underwent mammography with ultrasound that showed 2 areas in the left breast one at 12:00 and the second at 3:00 that appeared by ultrasound to least the 6 cm in maximal diameter and also multiple left axillary lymph nodes that were abnormal. One node was biopsied was positive for metastatic breast cancer. The other 2 areas were biopsied and the breast itself and were positive for invasive mammary carcinoma. HPI  Past Medical History  Diagnosis Date  . ALLERGIC RHINITIS   . GERD (gastroesophageal reflux disease)   . Hypertension   . Hypothyroidism   . Hx of colonic polyps     Past Surgical History  Procedure Laterality Date  . Tonsillectomy    . Caesarean section      x 1  . Abdominal hysterectomy  06/1988  . Mastectomy  04/1987    right side    Family History  Problem Relation Age of Onset  . Colon cancer Mother     family hx of colon ca 1st degree relative <60  . Coronary artery disease Father     family hx of CAD female 1st degree relative <50  . Stomach cancer Neg Hx   . Rectal cancer Neg Hx   . Esophageal cancer Neg Hx     Social History History  Substance Use Topics  . Smoking status: Never Smoker   . Smokeless tobacco: Former Neurosurgeon  . Alcohol Use: Yes     Comment: social    Allergies  Allergen Reactions  . Sulfa Antibiotics   . Sulfonamide Derivatives Hives, Itching and Swelling    Current Outpatient Prescriptions  Medication Sig Dispense Refill  . aspirin 81 MG tablet Take 81 mg by mouth daily.      Marland Kitchen diltiazem (CARDIZEM CD) 240 MG 24 hr capsule Take 1 capsule (240 mg total) by mouth daily.  90 capsule  2  .  hydrochlorothiazide (MICROZIDE) 12.5 MG capsule TAKE 1 CAPSULE (12.5 MG TOTAL) BY MOUTH EVERY MORNING.  90 capsule  2  . latanoprost (XALATAN) 0.005 % ophthalmic solution Place 1 drop into both eyes at bedtime.       Marland Kitchen levothyroxine (SYNTHROID, LEVOTHROID) 50 MCG tablet TAKE 1 TABLET (50 MCG TOTAL) BY MOUTH DAILY.  90 tablet  2  . NEXIUM 40 MG capsule TAKE 1 CAPSULE (40 MG TOTAL) BY MOUTH DAILY BEFORE BREAKFAST.  90 capsule  1   No current facility-administered medications for this visit.    Review of Systems Review of Systems  Constitutional: Negative.   HENT: Negative.   Eyes: Negative.   Respiratory: Negative.   Cardiovascular: Negative.   Gastrointestinal: Negative.   Endocrine: Negative.   Genitourinary: Negative.   Allergic/Immunologic: Negative.   Neurological: Negative.   Hematological: Negative.   Psychiatric/Behavioral: Negative.     Blood pressure 140/88, pulse 88, temperature 97.4 F (36.3 C), resp. rate 14, height 5\' 4"  (1.626 m), weight 222 lb (100.699 kg).  Physical Exam Physical Exam  Constitutional: She is oriented to person, place, and time. She appears well-developed and well-nourished.  HENT:  Head: Normocephalic and atraumatic.  Eyes: EOM are normal.  Pupils are equal, round, and reactive to light.  Neck: Normal range of motion. Neck supple.  Cardiovascular: Normal rate and regular rhythm.   Pulmonary/Chest: Effort normal and breath sounds normal. Left breast exhibits inverted nipple, mass, skin change and tenderness. Left breast exhibits no nipple discharge. Breasts are asymmetrical.    Musculoskeletal: Normal range of motion.  Lymphadenopathy:    She has axillary adenopathy.       Left axillary: Lateral adenopathy present.  Neurological: She is alert and oriented to person, place, and time.  Skin: Skin is dry.  Psychiatric: She has a normal mood and affect. Her behavior is normal. Thought content normal.    Data Reviewed Clinical Data: Diffuse  skin thickening involving the left areola  and nipple for the past 2 weeks. Status post right mastectomy for  breast cancer 25 years ago.  DIGITAL DIAGNOSTIC LEFT MAMMOGRAM WITH CAD AND LEFT BREAST  ULTRASOUND:  Comparison: Previous examinations at [facility], the  most recent dated 06/02/2005.  Findings:  ACR Breast Density Category 3: The breast tissue is heterogeneously  dense.  Interval diffuse left anterior and medial breast skin thickening,  most pronounced involving the areola. There is also interval mild  edema within the breast parenchyma. Also noted is interval  enlargement of multiple left axillary lymph nodes.  Mammographic images were processed with CAD.  On physical exam, the left breast is diffusely somewhat firm to  palpation with diffuse skin thickening and stiffness involving the  areola and nipple. There is also a faintly palpable lymph node in  the inferior left axilla, laterally.  Ultrasound is performed, showing a large irregular hypoechoic area  in the 12 o'clock position of the left breast, 5 cm from the  nipple. This cannot be included in its entirety in one field of  view, measuring at least 6.7 x 2.7 x 2.6 cm in maximum dimensions.  There is a similar area in the 1:30 o'clock position of the left  breast, 17 cm from the nipple, measuring 4.4 x 3.8 x 2.3 cm. Both  of these areas demonstrate posterior acoustical shadowing.  Also demonstrated are multiple enlarged, rounded, diffusely  hypoechoic left axillary lymph nodes. The largest measures 2.3 cm  in maximum diameter.  IMPRESSION:  Left breast skin thickening, edema and masses masses and abnormal  left axillary lymph nodes, as described above. This combination of  findings is highly suspicious for areas of primary breast cancer  with metastatic adenopathy. Ultrasound-guided core needle biopsy  of the mass in the 12 o'clock position of the left breast, mass in  the 1:30 o'clock position of the  left breast and one of the  abnormal left axillary lymph nodes is recommended. This has been  discussed with the patient and scheduled for 9:30 a.m. on  09/30/2012.  RECOMMENDATION:  Left breast ultrasound guided core needle biopsies (scheduled).  I have discussed the findings and recommendations with the patient.  Results were also provided in writing at the conclusion of the  visit.  BI-RADS CATEGORY 5: Highly suggestive of malignancy - appropriate  action should be taken.  Original Report Authenticated By: Beckie Salts, M.D.   Assessment    Left breast cancer locally advanced    Plan    MRI Oncology referral RTC to discuss options once MRI done since size is difficult to determine by U/S and given positive node and potential large size ,  Neoadjuvant chemotherapy may be first step.       CORNETT,THOMAS A.  10/05/2012, 3:51 PM

## 2012-10-05 NOTE — Patient Instructions (Signed)
WILL CHECK MRI to plan surgery. Refer to oncology and return to clinic next week.

## 2012-10-05 NOTE — Telephone Encounter (Signed)
Spoke with Christine [name] at the [facility] about this patient Christine [name] stated she would contact patient in am to schedule appt .

## 2012-10-07 ENCOUNTER — Other Ambulatory Visit (INDEPENDENT_AMBULATORY_CARE_PROVIDER_SITE_OTHER): Payer: Self-pay

## 2012-10-12 ENCOUNTER — Ambulatory Visit
Admission: RE | Admit: 2012-10-12 | Discharge: 2012-10-12 | Disposition: A | Discharge status: Home / Self Care | Payer: BC Managed Care – PPO | Source: Ambulatory Visit | Attending: Surgery | Admitting: Surgery

## 2012-10-12 DIAGNOSIS — C50912 Malignant neoplasm of unspecified site of left female breast: Secondary | ICD-10-CM

## 2012-10-12 MED ORDER — GADOBENATE DIMEGLUMINE 529 MG/ML IV SOLN
20.0000 mL | Freq: Once | INTRAVENOUS | Status: AC | PRN
  Administered 2012-10-12: 20 mL via INTRAVENOUS

## 2012-10-13 ENCOUNTER — Telehealth: Payer: Self-pay | Admitting: *Deleted

## 2012-10-13 NOTE — Telephone Encounter (Signed)
Confirmed 10/19/12 appt w/ pt.  Mailed before appt letter & packet to pt.  Emailed Music therapist at [facility] to make her aware.  Took paperwork to Med [facility] for chart.

## 2012-10-15 ENCOUNTER — Other Ambulatory Visit: Payer: BC Managed Care – PPO

## 2012-10-18 ENCOUNTER — Other Ambulatory Visit: Payer: Self-pay | Admitting: Oncology

## 2012-10-18 DIAGNOSIS — C50912 Malignant neoplasm of unspecified site of left female breast: Secondary | ICD-10-CM

## 2012-10-19 ENCOUNTER — Encounter: Payer: Self-pay | Admitting: Oncology

## 2012-10-19 ENCOUNTER — Ambulatory Visit: Payer: BC Managed Care – PPO

## 2012-10-19 ENCOUNTER — Other Ambulatory Visit: Payer: Self-pay | Admitting: Medical Oncology

## 2012-10-19 ENCOUNTER — Other Ambulatory Visit (HOSPITAL_BASED_OUTPATIENT_CLINIC_OR_DEPARTMENT_OTHER): Payer: BC Managed Care – PPO | Admitting: Lab

## 2012-10-19 ENCOUNTER — Telehealth: Payer: Self-pay | Admitting: Oncology

## 2012-10-19 ENCOUNTER — Ambulatory Visit (HOSPITAL_BASED_OUTPATIENT_CLINIC_OR_DEPARTMENT_OTHER): Payer: BC Managed Care – PPO | Admitting: Oncology

## 2012-10-19 VITALS — BP 147/87 | HR 88 | Temp 98.3°F | Resp 20 | Ht 64.0 in | Wt 223.3 lb

## 2012-10-19 DIAGNOSIS — C50919 Malignant neoplasm of unspecified site of unspecified female breast: Secondary | ICD-10-CM

## 2012-10-19 DIAGNOSIS — Z171 Estrogen receptor negative status [ER-]: Secondary | ICD-10-CM

## 2012-10-19 DIAGNOSIS — C50912 Malignant neoplasm of unspecified site of left female breast: Secondary | ICD-10-CM

## 2012-10-19 DIAGNOSIS — C773 Secondary and unspecified malignant neoplasm of axilla and upper limb lymph nodes: Secondary | ICD-10-CM

## 2012-10-19 LAB — CBC WITH DIFFERENTIAL/PLATELET
Eosinophils Absolute: 0.1 10*3/uL (ref 0.0–0.5)
HCT: 37.9 % (ref 34.8–46.6)
LYMPH%: 37.1 % (ref 14.0–49.7)
MCHC: 34.4 g/dL (ref 31.5–36.0)
MCV: 90 fL (ref 79.5–101.0)
MONO#: 0.7 10*3/uL (ref 0.1–0.9)
MONO%: 10.3 % (ref 0.0–14.0)
NEUT#: 3.6 10*3/uL (ref 1.5–6.5)
NEUT%: 50.2 % (ref 38.4–76.8)
Platelets: 213 10*3/uL (ref 145–400)
WBC: 7.1 10*3/uL (ref 3.9–10.3)

## 2012-10-19 LAB — COMPREHENSIVE METABOLIC PANEL (CC13)
BUN: 11.3 mg/dL (ref 7.0–26.0)
CO2: 31 mEq/L — ABNORMAL HIGH (ref 22–29)
Calcium: 9.8 mg/dL (ref 8.4–10.4)
Chloride: 103 mEq/L (ref 98–107)
Creatinine: 0.8 mg/dL (ref 0.6–1.1)
Glucose: 109 mg/dl — ABNORMAL HIGH (ref 70–99)
Total Bilirubin: 0.42 mg/dL (ref 0.20–1.20)

## 2012-10-19 MED ORDER — PROCHLORPERAZINE MALEATE 10 MG PO TABS: 10.0000 mg | ORAL_TABLET | Freq: Four times a day (QID) | ORAL | Status: DC | PRN

## 2012-10-19 MED ORDER — PROCHLORPERAZINE 25 MG RE SUPP: 25.0000 mg | Freq: Two times a day (BID) | RECTAL | Status: DC | PRN

## 2012-10-19 MED ORDER — POTASSIUM CHLORIDE CRYS ER 20 MEQ PO TBCR: 20.0000 meq | EXTENDED_RELEASE_TABLET | Freq: Two times a day (BID) | ORAL | Status: DC

## 2012-10-19 MED ORDER — LIDOCAINE-PRILOCAINE 2.5-2.5 % EX CREA: TOPICAL_CREAM | CUTANEOUS | Status: DC | PRN

## 2012-10-19 MED ORDER — LORAZEPAM 0.5 MG PO TABS: 0.5000 mg | ORAL_TABLET | Freq: Four times a day (QID) | ORAL | Status: DC | PRN

## 2012-10-19 MED ORDER — DEXAMETHASONE 4 MG PO TABS: ORAL_TABLET | ORAL | Status: DC

## 2012-10-19 MED ORDER — ONDANSETRON HCL 8 MG PO TABS: 8.0000 mg | ORAL_TABLET | Freq: Two times a day (BID) | ORAL | Status: DC | PRN

## 2012-10-19 NOTE — Progress Notes (Signed)
Christine [name] [id number] 1956/03/01 57 y.o. 10/19/2012 3:20 PM  CC  Christine Mew, MD [address] [place] [address] Dr. [name]   REASON FOR CONSULTATION:  57 year old female with new diagnosis of invasive mammary carcinoma of the left breast diagnosed  09/30/2012. Patient is seen in medical oncology for discussion of treatment options.   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    REFERRING PHYSICIAN: Dr. Maisie Fus Cornett  HISTORY OF PRESENT ILLNESS:  Christine [name] is a 57 y.o. female.  With prior history of right breast cancer patient underwent a mastectomy when she was 57 years old. This occurred during her pregnancy. After that patient did not take any kind of further treatments. She continued to do well. Recently she noted changes in her nipple had become itchy and retracted. Because of this she underwent mammogram with ultrasound that showed 2 areas in the left breast one at 12:00 position and a second at 3:00 position. She also had an ultrasound performed that showed the diameter to be at least 6 cm and multiple left axillary lymph nodes that were abnormal. She then went on to have a biopsy performed on 09/30/2012. 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 Needle core biopsy of the 3:00 position showed a ductal carcinoma in situ grade 3. Patient went on to have MRIs of the breasts performed and was noted to be marked diffuse enhancement involving majority of the left breast measuring 13.0 x 12.5 x 9.5 cm. There was diffuse skin thickening and dermal enhancement. Multiple enlarged level I left axillary lymph nodes. The  largest lymph node measured 2.4 cm in size. The large area of enhancement within the left breast extends posteriorly to abut the anterior portion of the pectoralis muscle. There was no evidence of internal mammary adenopathy or right axillary adenopathy. Patient was seen by Dr. [name] who has recommended neoadjuvant chemotherapy with eventual what sounds like a mastectomy and axillary lymph node dissection. Patient today is accompanied by her husband she is without any complaints.   Past Medical History: Past Medical History  Diagnosis Date  . ALLERGIC RHINITIS   . GERD (gastroesophageal reflux disease)   . Hypertension   . Hypothyroidism   . Hx of colonic polyps     Past Surgical History: Past Surgical History  Procedure Laterality Date  . Tonsillectomy    . Caesarean section      x 1  . Abdominal hysterectomy  06/1988  . Mastectomy  04/1987    right side  right mastectomy of right breast 26 years ago at 30,   Family History: Family History  Problem Relation Age of Onset  . Colon cancer Mother     family hx of colon ca 1st degree relative <60  . Coronary artery disease Father     family hx of CAD female 1st degree relative <50  . Stomach cancer Neg Hx   . Rectal cancer Neg Hx   . Esophageal cancer Neg Hx     Social History History  Substance Use Topics  . Smoking status: Never Smoker   . Smokeless tobacco: Former Neurosurgeon  . Alcohol Use: Yes  Comment: social    Allergies: Allergies  Allergen Reactions  . Sulfa Antibiotics Hives, Itching and Swelling  . Sulfonamide Derivatives Hives, Itching and Swelling    Current Medications: Current Outpatient Prescriptions  Medication Sig Dispense Refill  . aspirin 81 MG tablet Take 81 mg by mouth daily.      Marland Kitchen diltiazem (CARDIZEM CD) 240 MG 24 hr capsule Take 1 capsule (240 mg total) by mouth daily.  90 capsule  2  . hydrochlorothiazide (MICROZIDE) 12.5 MG capsule TAKE 1 CAPSULE (12.5 MG TOTAL) BY MOUTH EVERY MORNING.   90 capsule  2  . latanoprost (XALATAN) 0.005 % ophthalmic solution Place 1 drop into both eyes at bedtime.       Marland Kitchen levothyroxine (SYNTHROID, LEVOTHROID) 50 MCG tablet TAKE 1 TABLET (50 MCG TOTAL) BY MOUTH DAILY.  90 tablet  2  . NEXIUM 40 MG capsule TAKE 1 CAPSULE (40 MG TOTAL) BY MOUTH DAILY BEFORE BREAKFAST.  90 capsule  1   No current facility-administered medications for this visit.    OB/GYN History: menarche at 89, menopause at 57-50, no HRT, G2P1,   Fertility Discussion: N/A Prior History of Cancer: right breast cancer in 1988, s/p mastectomy, no adjuvant tretments, age at cancer 59  Health Maintenance:  Colonoscopy yes Bone Density no Last PAP smear no  ECOG PERFORMANCE STATUS: 0 - Asymptomatic  Genetic Counseling/testing: refer to genetic counseling and testing due to early onset breast cancer and bilateral breast cancer  REVIEW OF SYSTEMS:  A comprehensive review of systems was negative.  PHYSICAL EXAMINATION: Blood pressure 147/87, pulse 88, temperature 98.3 F (36.8 C), temperature source Oral, resp. rate 20, height 5\' 4"  (1.626 m), weight 223 lb 4.8 oz (101.288 kg).  GEX:BMWUX, healthy and no distress SKIN: skin color, texture, turgor are normal HEAD: Normocephalic EYES: PERRLA, EOMI, Conjunctiva are pink and non-injected EARS: External ears normal OROPHARYNX:no exudate and no erythema  NECK: supple LYMPH:  No cervical supraclavicular and no right axillary lymph nodes are enlarged left axilla does reveal palpable lymph nodes BREAST:abnormal mass palpable in the left breast with inversion of the nipple in dimpling of the skin, right mastectomy scar healed well no evidence of local recurrence. LUNGS: clear to auscultation  HEART: regular rate & rhythm ABDOMEN:abdomen soft, non-tender, normal bowel sounds and no masses or organomegaly BACK: Back symmetric, no curvature., No CVA tenderness EXTREMITIES:no edema, no clubbing, no cyanosis  NEURO: alert & oriented x  3 with fluent speech, no focal motor/sensory deficits, gait normal, reflexes normal and symmetric     STUDIES/RESULTS: US Breast Left  10/28/2012  *RADIOLOGY REPORT*  Clinical Data:  Diffuse skin thickening involving the left areola and nipple for the past 2 weeks.  Status post right mastectomy for breast cancer 25 years ago.  DIGITAL DIAGNOSTIC LEFT MAMMOGRAM WITH CAD AND LEFT BREAST ULTRASOUND:  Comparison:  Previous examinations at [facility], the most recent dated 06/02/2005.  Findings:  ACR Breast Density Category 3: The breast tissue is heterogeneously dense.  Interval diffuse left anterior and medial breast skin thickening, most pronounced involving the areola.  There is also interval mild edema within the breast parenchyma.  Also noted is interval enlargement of multiple left axillary lymph nodes.  Mammographic images were processed with CAD.  On physical exam, the left breast is diffusely somewhat firm to palpation with diffuse skin thickening and stiffness involving the areola and nipple.  There is also a faintly palpable lymph node in the inferior left axilla, laterally.  Ultrasound  is performed, showing a large irregular hypoechoic area in the 12 o'clock position of the left breast, 5 cm from the nipple.  This cannot be included in its entirety in one field of view, measuring at least 6.7 x 2.7 x 2.6 cm in maximum dimensions.  There is a similar area in the 1:30 o'clock position of the left breast, 17 cm from the nipple, measuring 4.4 x 3.8 x 2.3 cm.  Both of these areas demonstrate posterior acoustical shadowing.  Also demonstrated are multiple enlarged, rounded, diffusely hypoechoic left axillary lymph nodes.  The largest measures 2.3 cm in maximum diameter.  IMPRESSION: Left breast skin thickening, edema and masses masses and abnormal left axillary lymph nodes, as described above.  This combination of findings is highly suspicious for areas of primary breast cancer with metastatic  adenopathy.  Ultrasound-guided core needle biopsy of the mass in the 12 o'clock position of the left breast, mass in the 1:30 o'clock position of the left breast and one of the abnormal left axillary lymph nodes is recommended.  This has been discussed with the patient and scheduled for 9:30 a.m. on 09/30/2012.  RECOMMENDATION: Left breast ultrasound guided core needle biopsies (scheduled).  I have discussed the findings and recommendations with the patient. Results were also provided in writing at the conclusion of the visit.  BI-RADS CATEGORY 5:  Highly suggestive of malignancy - appropriate action should be taken.   Original Report Authenticated By: Beckie Salts, M.D.    Mr Breast Bilateral W Wo Contrast  10/12/2012  *RADIOLOGY REPORT*  Clinical Data: History of previous malignant mastectomy of the right breast 25 years ago.  The patient developed diffuse skin thickening and firmness within the left breast.  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  BUN and creatinine were obtained on site at [facility] at [address] Results:  BUN 7 mg/dL,  Creatinine 1.0 mg/dL.  BILATERAL BREAST MRI WITH AND WITHOUT CONTRAST  Technique: Multiplanar, multisequence MR images of both breasts were obtained prior to and following the intravenous administration of 20ml of Multihance.  Three dimensional images were evaluated at the independent DynaCad workstation.  Comparison:  Mammograms dated 09/30/2012 and 09/28/2012.  Findings: There has been a previous right mastectomy.  There is marked diffuse enhancement involving the majority of the left breast.  This measures 13.0 x 12.5 x 9.5 cm in size.  This is associated with a mixture of plateau and washout enhancement kinetics.  In addition,  there is diffuse skin thickening and dermal enhancement.  There are multiple enlarged level I left  axillary lymph nodes.  The largest lymph node measures 2.4 cm in size.  The large area of enhancement within the left breast extends posteriorly to abut the anterior portion of the pectoral muscle. There is no evidence for internal mammary adenopathy or right axillary adenopathy.  There are no additional findings.  IMPRESSION:  1.  Large ( 13 cm) area of enhancement involving the majority of the left breast consistent with the patient's known invasive ductal carcinoma and DCIS. This extends posteriorly to abut the pectoralis muscle. 2.  Multiple enlarged level I left axillary lymph nodes. 3.  Previous right mastectomy.  RECOMMENDATION: Treatment plan  THREE-DIMENSIONAL MR IMAGE RENDERING ON INDEPENDENT WORKSTATION:  Three-dimensional MR images were rendered by post-processing of the original MR data on an independent workstation.  The three- dimensional MR images were  interpreted, and findings were reported in the accompanying complete MRI report for this study.  BI-RADS CATEGORY 6:  Known biopsy-proven malignancy - appropriate action should be taken.   Original Report Authenticated By: Rolla Plate, M.D.    Mm Digital Diagnostic Unilat L  09/30/2012  *RADIOLOGY REPORT*  Clinical Data:  Status post ultrasound guided core biopsies of masses in the 12 o'clock and 3 o'clock locations of the left breast.  Clips were placed at the time of biopsy.  DIGITAL DIAGNOSTIC LEFT MAMMOGRAM  Comparison:  Previous exams.  Findings:  Films are performed following ultrasound guided biopsy of masses in the 12 o'clock and 3 o'clock locations of the left breast.  A cork shaped clip is identified in the 12 o'clock location.  The top hat shaped clip is identified in the 3 o'clock location, both in expected locations.  Clips are approximately 10 cm apart.  IMPRESSION: Tissue marker clips are in expected locations after biopsy.   Original Report Authenticated By: Norva Pavlov, M.D.    Mm Digital Diagnostic Unilat L  09/28/2012   *RADIOLOGY REPORT*  Clinical Data:  Diffuse skin thickening involving the left areola and nipple for the past 2 weeks.  Status post right mastectomy for breast cancer 25 years ago.  DIGITAL DIAGNOSTIC LEFT MAMMOGRAM WITH CAD AND LEFT BREAST ULTRASOUND:  Comparison:  Previous examinations at [facility], the most recent dated 06/02/2005.  Findings:  ACR Breast Density Category 3: The breast tissue is heterogeneously dense.  Interval diffuse left anterior and medial breast skin thickening, most pronounced involving the areola.  There is also interval mild edema within the breast parenchyma.  Also noted is interval enlargement of multiple left axillary lymph nodes.  Mammographic images were processed with CAD.  On physical exam, the left breast is diffusely somewhat firm to palpation with diffuse skin thickening and stiffness involving the areola and nipple.  There is also a faintly palpable lymph node in the inferior left axilla, laterally.  Ultrasound is performed, showing a large irregular hypoechoic area in the 12 o'clock position of the left breast, 5 cm from the nipple.  This cannot be included in its entirety in one field of view, measuring at least 6.7 x 2.7 x 2.6 cm in maximum dimensions.  There is a similar area in the 1:30 o'clock position of the left breast, 17 cm from the nipple, measuring 4.4 x 3.8 x 2.3 cm.  Both of these areas demonstrate posterior acoustical shadowing.  Also demonstrated are multiple enlarged, rounded, diffusely hypoechoic left axillary lymph nodes.  The largest measures 2.3 cm in maximum diameter.  IMPRESSION: Left breast skin thickening, edema and masses masses and abnormal left axillary lymph nodes, as described above.  This combination of findings is highly suspicious for areas of primary breast cancer with metastatic adenopathy.  Ultrasound-guided core needle biopsy of the mass in the 12 o'clock position of the left breast, mass in the 1:30 o'clock position of the left  breast and one of the abnormal left axillary lymph nodes is recommended.  This has been discussed with the patient and scheduled for 9:30 a.m. on 09/30/2012.  RECOMMENDATION: Left breast ultrasound guided core needle biopsies (scheduled).  I have discussed the findings and recommendations with the patient. Results were also provided in writing at the conclusion of the visit.  BI-RADS CATEGORY 5:  Highly suggestive of malignancy - appropriate action should be taken.   Original Report Authenticated By: Beckie Salts, M.D.    [facility] Radiologist Eval And  Mgmt  10/01/2012  *RADIOLOGY REPORT*  ESTABLISHED PATIENT OFFICE VISIT - LEVEL II [phone])  Chief Complaint:  The patient has diffuse abnormality throughout the left breast, skin thickening, and enlarged left axillary lymph nodes.  History of right mastectomy 25 years ago.  Status post ultrasound guided core biopsies of 12 o'clock, 3 o'clock locations in the left breast as well as left axilla.  History:  Overnight, the patient reports doing well.  She reports no significant pain at the biopsy sites.  Exam:  Biopsy sites are clean and dry.  No significant hematoma or ecchymosis.  Pathology: 1. Breast, left, needle core biopsy, 12 o'clock  - INVASIVE DUCTAL CARCINOMA.  2. Breast, left, needle core biopsy, 3 o'clock  - DUCTAL CARCINOMA IN SITU, GRADE III.  3. Lymph node, needle/core biopsy, left axilla  - ONE LYMPH NODE, POSITIVE FOR METASTATIC MAMMARY CARCINOMA  Assessment and Plan:  The pathology correlates well with the imaging and clinical appearance.  I have discussed the findings with the patient and her husband. Surgical consultation has been arranged for the patient with Dr. Luisa Hart on 10/05/2012.  MRI was not arranged but can be scheduled as needed.  Educational materials were given to the patient, and questions were answered.   Original Report Authenticated By: Norva Pavlov, M.D.    [place] Lt Breast Bx W Loc Dev 1st Lesion Img Bx Spec US Guide  09/30/2012   *RADIOLOGY REPORT*  Clinical Data:  Diffuse abnormality throughout the left breast and enlarged left axillary lymph nodes. Two additional biopsies are performed on the same day and dictated separately.  ULTRASOUND GUIDED VACUUM ASSISTED CORE BIOPSY OF THE LEFT BREAST  Comparison: Previous exams.  I met with the patient and we discussed the procedure of ultrasound- guided biopsy, including benefits and alternatives.  We discussed the high likelihood of a successful procedure. We discussed the risks of the procedure including infection, bleeding, tissue injury, clip migration, and inadequate sampling.  Informed written consent was given.  Using sterile technique, 2% lidocaine ultrasound guidance and a 12 gauge vacuum assisted needle biopsy was performed of hypoechoic abnormality in the 12 o'clock location of the left breast using a lateral approach.  At the conclusion of the procedure, a a cork shaped tissue marker clip was deployed into the biopsy cavity. Follow-up 2-view mammogram was performed and dictated separately.  IMPRESSION: Ultrasound-guided biopsy of left breast lesion.  No apparent complications.   Original Report Authenticated By: Norva Pavlov, M.D.    [place] Lt Breast Bx W Loc Dev Ea Add Lesion Img Bx Spec US Guide  09/30/2012  *RADIOLOGY REPORT*  Clinical Data:  Diffuse abnormality of the left breast and enlarged left axillary lymph nodes.  Two additional biopsies are performed on the same day and dictated separately.  ULTRASOUND GUIDED VACUUM ASSISTED CORE BIOPSY OF THE LEFT BREAST  Comparison: Previous exams.  I met with the patient and we discussed the procedure of ultrasound- guided biopsy, including benefits and alternatives.  We discussed the high likelihood of a successful procedure. We discussed the risks of the procedure including infection, bleeding, tissue injury, clip migration, and inadequate sampling.  Informed written consent was given.  Using sterile technique, 2% lidocaine ultrasound  guidance and a 12 gauge vacuum assisted needle biopsy was performed of abnormality in the 3 o'clock location of the left breast using a lateral approach. At the conclusion of the procedure, a top hat tissue marker clip was deployed into the biopsy cavity.  Follow-up 2-view mammogram was performed  and dictated separately.  IMPRESSION: Ultrasound-guided biopsy of left breast lesion.  No apparent complications.   Original Report Authenticated By: Norva Pavlov, M.D.    [place] Lt Breast Bx W Loc Dev Ea Add Lesion Img Bx Spec US Guide  09/30/2012  *RADIOLOGY REPORT*  Clinical Data:  Enlarged left axillary lymph node.  The patient has skin thickening and diffuse abnormality within the left breast. Three biopsies are performed on the same date, two of which are dictated separately.  ULTRASOUND GUIDED CORE BIOPSY OF THE  LEFT AXILLA  Comparison: Previous exams.  I met with the patient and we discussed the procedure of ultrasound- guided biopsy, including benefits and alternatives.  We discussed the high likelihood of a successful procedure. We discussed the risks of the procedure, including infection, bleeding, tissue injury, clip migration, and inadequate sampling.  Informed written consent was given.  Using sterile technique 1% and 2% lidocaine, ultrasound guidance and a 14 gauge automated biopsy device, biopsy was performed of left axillary lymph node using a lateral approach.  IMPRESSION: Ultrasound guided biopsy of left axillary lymph node.  No apparent complications.   Original Report Authenticated By: Norva Pavlov, M.D.      LABS:    Chemistry      Component Value Date/Time   NA 143 10/19/2012 1425   NA 138 09/13/2011 1910   K 2.9 Repeated and Verified* 10/19/2012 1425   K 3.3* 09/13/2011 1910   CL 103 10/19/2012 1425   CL 98 09/13/2011 1910   CO2 31* 10/19/2012 1425   CO2 29 09/13/2011 1910   BUN 11.3 10/19/2012 1425   BUN 10 09/13/2011 1910   CREATININE 0.8 10/19/2012 1425   CREATININE 0.70 09/13/2011 1910       Component Value Date/Time   CALCIUM 9.8 10/19/2012 1425   CALCIUM 9.4 09/13/2011 1910   ALKPHOS 79 10/19/2012 1425   ALKPHOS 78 09/13/2011 1910   AST 27 10/19/2012 1425   AST 18 09/13/2011 1910   ALT 29 10/19/2012 1425   ALT 12 09/13/2011 1910   BILITOT 0.42 10/19/2012 1425   BILITOT 0.3 09/13/2011 1910      Lab Results  Component Value Date   WBC 7.1 10/19/2012   HGB 13.0 10/19/2012   HCT 37.9 10/19/2012   MCV 90.0 10/19/2012   PLT 213 10/19/2012    PATHOLOGY: ADDITIONAL INFORMATION: 1. PROGNOSTIC INDICATORS - ACIS Results IMMUNOHISTOCHEMICAL AND MORPHOMETRIC ANALYSIS BY THE AUTOMATED CELLULAR IMAGING SYSTEM (ACIS) Estrogen Receptor (Negative, <1%): 0%, NEGATIVE Progesterone Receptor (Negative, <1%): 0%, NEGATIVE Proliferation Marker Ki67 by M IB-1 (Low<20%): 21% COMMENT: The negative hormone receptor study(ies) in this case have an internal positive control. All controls stained appropriately Pecola Leisure MD Pathologist, Electronic Signature ( Signed 10/06/2012) 1. CHROMOGENIC IN-SITU HYBRIDIZATION Interpretation HER-2/NEU BY CISH - NO AMPLIFICATION OF HER-2 DETECTED. THE RATIO OF HER-2: CEP 17 SIGNALS WAS 1.65. Reference range: Ratio: HER2:CEP17 < 1.8 - gene amplification not observed Ratio: HER2:CEP 17 1.8-2.2 - equivocal result Ratio: HER2:CEP17 > 2.2 - gene amplification observed Pecola Leisure MD Pathologist, Electronic Signature ( Signed 10/05/2012) 1 of 3 FINAL for Brede, Jamirra W [phone]) FINAL DIAGNOSIS Diagnosis 1. Breast, left, needle core biopsy, 12 o'clock - INVASIVE DUCTAL CARCINOMA, SEE COMMENT. 2. Breast, left, needle core biopsy, 3 o'clock - DUCTAL CARCINOMA IN SITU, GRADE III, SEE COMMENT. 3. Lymph node, needle/core biopsy, left axilla - ONE LYMPH NODE, POSITIVE FOR METASTATIC MAMMARY CARCINOMA (1/1) SEE COMMENT. Microscopic Comment 1. , 2, 3. Although the grade of tumor  is best assessed at resection, with these biopsies, both the invasive and in situ  carcinoma are grade III. Breast prognostic studies are pending and will be reported as an addendum (parts 1 and 2). The case was reviewed with Dr. Raynald Blend who concurs. (CR:kh 10-01-12)  ASSESSMENT    57 year old female with  #1 history of breast cancer of the right breast status post mastectomy when she was 57 years old.  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 Patient's tumor is ER negative PR negative HER-2/neu negative with a Ki-67 of 21%. Patient is being seen in medical oncology for neoadjuvant chemotherapy. Patient and I discussed her pathology in detail. We discussed treatment for breast cancer including surgical and with chemotherapy. Patient understands that she may still need a mastectomy however by giving her neoadjuvant chemotherapy her surgery may be made a little bit easier hopefully with giving her negative margins. She understands that neoadjuvant chemotherapy will also treat her distant disease.I discussed type of chemotherapy that she should have. This would include dose dense FEC x6 cycles followed by weekly Taxol carboplatinum for 12 weeks.  #3 we discussed staging studies.she will need PET/CT.  #4 because of her prior history of early onset breast cancer I did recommend that she be seen by genetic counselor for counseling and testing.  #3 she will need echocardiogram Port-A-Cath placement and chemotherapy class.  Clinical Trial Eligibility: no Multidisciplinary conference discussion yes     PLAN:    #1 patient will proceed with neoadjuvant chemotherapy consisting of 6 cycles of dose dense FEC.  #2 Port-A-Cath placement  #3 echocardiogram  #4 chemotherapy teaching class.  #5 staging scans with a PET CT  #6 genetic counseling and testing.        Discussion: Patient is being treated per NCCN breast cancer care guidelines appropriate for stage.III trple  negative   Thank you so much for allowing me to participate in the care of [employer]. I will continue to follow up the patient with you and assist in her care.  All questions were answered. The patient knows to call the clinic with any problems, questions or concerns. We can certainly see the patient much sooner if necessary.  I spent 60 minutes counseling the patient face to face. The total time spent in the appointment was 60 minutes.  Drue Second, MD Medical/Oncology [facility] [phone] (beeper) [phone] (Office)  10/19/2012, 3:20 PM

## 2012-10-19 NOTE — Patient Instructions (Addendum)
diagnosis of breast cancer   neoadjuvant chemotherapy with FEC every 2 weeks for 6 cycles then taxol/carbo every week for 12 weeks   port placement  We discussed scans with PET/CT echo  chemoclass  Genetic counseling and testing

## 2012-10-19 NOTE — Telephone Encounter (Signed)
gv pt appt schedule March thru June including genetics 3/31 and Dr. Michell Heinrich 3/28. Pt aware central will contact her re appt for pet and that I will call re echo appt. Echo to [facility] for preauth.

## 2012-10-19 NOTE — Progress Notes (Signed)
Checked in new patient. No financial issues. She did have her [employer] form.

## 2012-10-20 ENCOUNTER — Telehealth: Payer: Self-pay | Admitting: *Deleted

## 2012-10-20 ENCOUNTER — Other Ambulatory Visit: Payer: Self-pay | Admitting: Certified Registered Nurse Anesthetist

## 2012-10-20 ENCOUNTER — Other Ambulatory Visit (INDEPENDENT_AMBULATORY_CARE_PROVIDER_SITE_OTHER): Payer: Self-pay | Admitting: Surgery

## 2012-10-20 ENCOUNTER — Telehealth (INDEPENDENT_AMBULATORY_CARE_PROVIDER_SITE_OTHER): Payer: Self-pay | Admitting: General Surgery

## 2012-10-20 NOTE — Telephone Encounter (Signed)
Rec'd call in triage, Eunice Blase calling to inform Dr Welton Flakes that Dr Luisa Hart was wanting to put a PAC in patient on 10/27/12, but patient is scheduled for PET/CT on same day. Informed her that last check, PET scheduling is up to 2-3 weeks out. Asked Debbie to see If another associate is able to do this, or if Dr Luisa Hart can do this on another day, before patient starts treatment on 11/02/2012. Please keep [place] informed if this is unable to be arranged.

## 2012-10-20 NOTE — Progress Notes (Signed)
Talked with pt-she was not aware of PAC sch 10/27/12-she has CT scans that day and her  Chemo class 3/27. Called [facility] OR schedulers to see if this needs to be r/s this PAC insertion.

## 2012-10-20 NOTE — Telephone Encounter (Signed)
Christine [name] called wanted to talk to a schedule

## 2012-10-22 ENCOUNTER — Encounter: Payer: Self-pay | Admitting: Oncology

## 2012-10-22 ENCOUNTER — Encounter (HOSPITAL_BASED_OUTPATIENT_CLINIC_OR_DEPARTMENT_OTHER): Payer: Self-pay | Admitting: *Deleted

## 2012-10-22 ENCOUNTER — Encounter: Payer: BC Managed Care – PPO | Admitting: Internal Medicine

## 2012-10-22 NOTE — Progress Notes (Signed)
Faxed husband's fmla form to [phone].

## 2012-10-22 NOTE — Progress Notes (Signed)
Put [employer] disability form on nurse's desk °

## 2012-10-22 NOTE — Progress Notes (Signed)
Put critical illness form on nurse's desk °

## 2012-10-22 NOTE — Progress Notes (Signed)
Will come in for ekg

## 2012-10-22 NOTE — Progress Notes (Signed)
Dr Mignon Pine ordered [facility] cmet-cxr-pt had labs cancer center 10/19/12-ct chest sch 10/27/12 with PET scan. ekg is all she needs here

## 2012-10-24 ENCOUNTER — Encounter: Payer: Self-pay | Admitting: Oncology

## 2012-10-26 ENCOUNTER — Encounter: Payer: Self-pay | Admitting: Radiation Oncology

## 2012-10-26 ENCOUNTER — Encounter: Payer: Self-pay | Admitting: Oncology

## 2012-10-26 NOTE — Progress Notes (Signed)
Faxed critical illness form to [employer] @ [phone].

## 2012-10-26 NOTE — Progress Notes (Signed)
Faxed fmla form to [employer] @ [phone] °

## 2012-10-27 ENCOUNTER — Ambulatory Visit (HOSPITAL_COMMUNITY)
Admission: RE | Admit: 2012-10-27 | Discharge: 2012-10-27 | Disposition: A | Discharge status: Home / Self Care | Payer: BC Managed Care – PPO | Source: Ambulatory Visit | Attending: Oncology | Admitting: Oncology

## 2012-10-27 ENCOUNTER — Telehealth: Payer: Self-pay | Admitting: *Deleted

## 2012-10-27 ENCOUNTER — Encounter (HOSPITAL_COMMUNITY)
Admission: RE | Admit: 2012-10-27 | Discharge: 2012-10-27 | Disposition: A | Discharge status: Home / Self Care | Payer: BC Managed Care – PPO | Source: Ambulatory Visit | Attending: Oncology | Admitting: Oncology

## 2012-10-27 ENCOUNTER — Encounter (HOSPITAL_COMMUNITY): Payer: Self-pay

## 2012-10-27 DIAGNOSIS — C50912 Malignant neoplasm of unspecified site of left female breast: Secondary | ICD-10-CM

## 2012-10-27 DIAGNOSIS — C50919 Malignant neoplasm of unspecified site of unspecified female breast: Secondary | ICD-10-CM | POA: Insufficient documentation

## 2012-10-27 DIAGNOSIS — Z90710 Acquired absence of both cervix and uterus: Secondary | ICD-10-CM | POA: Insufficient documentation

## 2012-10-27 DIAGNOSIS — R599 Enlarged lymph nodes, unspecified: Secondary | ICD-10-CM | POA: Insufficient documentation

## 2012-10-27 DIAGNOSIS — R234 Changes in skin texture: Secondary | ICD-10-CM | POA: Insufficient documentation

## 2012-10-27 DIAGNOSIS — Z9010 Acquired absence of unspecified breast and nipple: Secondary | ICD-10-CM | POA: Insufficient documentation

## 2012-10-27 LAB — GLUCOSE, CAPILLARY: Glucose-Capillary: 97 mg/dL (ref 70–99)

## 2012-10-27 MED ORDER — FLUDEOXYGLUCOSE F - 18 (FDG) INJECTION
17.9000 | Freq: Once | INTRAVENOUS | Status: AC | PRN
  Administered 2012-10-27: 17.9 via INTRAVENOUS

## 2012-10-27 MED ORDER — IOHEXOL 300 MG/ML  SOLN
100.0000 mL | Freq: Once | INTRAMUSCULAR | Status: AC | PRN
  Administered 2012-10-27: 100 mL via INTRAVENOUS

## 2012-10-27 NOTE — Telephone Encounter (Signed)
Called pt and requested for her to come and see Dr. Welton Flakes after her genetic appt on 11/01/12 and she was fine w/ that.  Cancelled her lab appt for 4/1 and informed the pt to come in at 2:15 for her treatment only on 11/02/12.

## 2012-10-28 ENCOUNTER — Encounter (HOSPITAL_BASED_OUTPATIENT_CLINIC_OR_DEPARTMENT_OTHER): Payer: Self-pay | Admitting: Anesthesiology

## 2012-10-28 ENCOUNTER — Ambulatory Visit (HOSPITAL_BASED_OUTPATIENT_CLINIC_OR_DEPARTMENT_OTHER)
Admission: RE | Admit: 2012-10-28 | Discharge: 2012-10-28 | Disposition: A | Discharge status: Home / Self Care | Payer: BC Managed Care – PPO | Source: Ambulatory Visit | Attending: Surgery | Admitting: Surgery

## 2012-10-28 ENCOUNTER — Ambulatory Visit (HOSPITAL_COMMUNITY): Payer: BC Managed Care – PPO

## 2012-10-28 ENCOUNTER — Encounter (HOSPITAL_BASED_OUTPATIENT_CLINIC_OR_DEPARTMENT_OTHER): Payer: Self-pay | Admitting: *Deleted

## 2012-10-28 ENCOUNTER — Encounter: Payer: Self-pay | Admitting: Medical Oncology

## 2012-10-28 ENCOUNTER — Encounter: Payer: Self-pay | Admitting: *Deleted

## 2012-10-28 ENCOUNTER — Ambulatory Visit (HOSPITAL_BASED_OUTPATIENT_CLINIC_OR_DEPARTMENT_OTHER): Payer: BC Managed Care – PPO | Admitting: Anesthesiology

## 2012-10-28 ENCOUNTER — Other Ambulatory Visit: Payer: Self-pay

## 2012-10-28 ENCOUNTER — Encounter (HOSPITAL_BASED_OUTPATIENT_CLINIC_OR_DEPARTMENT_OTHER)
Admission: RE | Disposition: A | Discharge status: Home / Self Care | Payer: Self-pay | Source: Ambulatory Visit | Attending: Surgery

## 2012-10-28 ENCOUNTER — Other Ambulatory Visit: Payer: BC Managed Care – PPO

## 2012-10-28 DIAGNOSIS — L719 Rosacea, unspecified: Secondary | ICD-10-CM

## 2012-10-28 DIAGNOSIS — R21 Rash and other nonspecific skin eruption: Secondary | ICD-10-CM

## 2012-10-28 DIAGNOSIS — D2360 Other benign neoplasm of skin of unspecified upper limb, including shoulder: Secondary | ICD-10-CM

## 2012-10-28 DIAGNOSIS — C50912 Malignant neoplasm of unspecified site of left female breast: Secondary | ICD-10-CM

## 2012-10-28 DIAGNOSIS — Z860100 Personal history of colon polyps, unspecified: Secondary | ICD-10-CM

## 2012-10-28 DIAGNOSIS — J309 Allergic rhinitis, unspecified: Secondary | ICD-10-CM

## 2012-10-28 DIAGNOSIS — K219 Gastro-esophageal reflux disease without esophagitis: Secondary | ICD-10-CM

## 2012-10-28 DIAGNOSIS — C50919 Malignant neoplasm of unspecified site of unspecified female breast: Secondary | ICD-10-CM

## 2012-10-28 DIAGNOSIS — I10 Essential (primary) hypertension: Secondary | ICD-10-CM

## 2012-10-28 DIAGNOSIS — C773 Secondary and unspecified malignant neoplasm of axilla and upper limb lymph nodes: Secondary | ICD-10-CM | POA: Insufficient documentation

## 2012-10-28 DIAGNOSIS — J069 Acute upper respiratory infection, unspecified: Secondary | ICD-10-CM

## 2012-10-28 DIAGNOSIS — E876 Hypokalemia: Secondary | ICD-10-CM

## 2012-10-28 DIAGNOSIS — E039 Hypothyroidism, unspecified: Secondary | ICD-10-CM

## 2012-10-28 DIAGNOSIS — Z9011 Acquired absence of right breast and nipple: Secondary | ICD-10-CM

## 2012-10-28 DIAGNOSIS — M1710 Unilateral primary osteoarthritis, unspecified knee: Secondary | ICD-10-CM

## 2012-10-28 DIAGNOSIS — R7309 Other abnormal glucose: Secondary | ICD-10-CM

## 2012-10-28 DIAGNOSIS — F341 Dysthymic disorder: Secondary | ICD-10-CM

## 2012-10-28 DIAGNOSIS — M5412 Radiculopathy, cervical region: Secondary | ICD-10-CM

## 2012-10-28 DIAGNOSIS — S0003XA Contusion of scalp, initial encounter: Secondary | ICD-10-CM

## 2012-10-28 DIAGNOSIS — R55 Syncope and collapse: Secondary | ICD-10-CM

## 2012-10-28 DIAGNOSIS — S1093XA Contusion of unspecified part of neck, initial encounter: Secondary | ICD-10-CM

## 2012-10-28 DIAGNOSIS — M542 Cervicalgia: Secondary | ICD-10-CM

## 2012-10-28 HISTORY — PX: PORTACATH PLACEMENT: SHX2246

## 2012-10-28 HISTORY — DX: Presence of spectacles and contact lenses: Z97.3

## 2012-10-28 LAB — POCT I-STAT, CHEM 8
BUN: 10 mg/dL (ref 6–23)
Chloride: 108 mEq/L (ref 96–112)
Creatinine, Ser: 0.8 mg/dL (ref 0.50–1.10)
Potassium: 3.9 mEq/L (ref 3.5–5.1)
Sodium: 144 mEq/L (ref 135–145)
TCO2: 27 mmol/L (ref 0–100)

## 2012-10-28 SURGERY — INSERTION, TUNNELED CENTRAL VENOUS DEVICE, WITH PORT
Anesthesia: General | Laterality: Right | Wound class: Clean

## 2012-10-28 MED ORDER — OXYCODONE HCL 5 MG/5ML PO SOLN: 5.0000 mg | Freq: Once | ORAL | Status: AC | PRN

## 2012-10-28 MED ORDER — MEPERIDINE HCL 25 MG/ML IJ SOLN: 6.2500 mg | INTRAMUSCULAR | Status: DC | PRN

## 2012-10-28 MED ORDER — HEPARIN SOD (PORK) LOCK FLUSH 100 UNIT/ML IV SOLN
INTRAVENOUS | Status: DC | PRN
  Administered 2012-10-28: 500 [IU] via INTRAVENOUS

## 2012-10-28 MED ORDER — CHLORHEXIDINE GLUCONATE 4 % EX LIQD: 1.0000 "application " | Freq: Once | CUTANEOUS | Status: DC

## 2012-10-28 MED ORDER — OXYCODONE HCL 5 MG PO TABS
5.0000 mg | ORAL_TABLET | Freq: Once | ORAL | Status: AC | PRN
  Administered 2012-10-28: 5 mg via ORAL

## 2012-10-28 MED ORDER — HYDROMORPHONE HCL PF 1 MG/ML IJ SOLN
0.2500 mg | INTRAMUSCULAR | Status: DC | PRN
  Administered 2012-10-28: 0.5 mg via INTRAVENOUS

## 2012-10-28 MED ORDER — ONDANSETRON HCL 4 MG/2ML IJ SOLN: 4.0000 mg | Freq: Once | INTRAMUSCULAR | Status: DC | PRN

## 2012-10-28 MED ORDER — PROPOFOL 10 MG/ML IV BOLUS
INTRAVENOUS | Status: DC | PRN
  Administered 2012-10-28: 150 mg via INTRAVENOUS

## 2012-10-28 MED ORDER — MIDAZOLAM HCL 5 MG/5ML IJ SOLN
INTRAMUSCULAR | Status: DC | PRN
  Administered 2012-10-28: 2 mg via INTRAVENOUS

## 2012-10-28 MED ORDER — FENTANYL CITRATE 0.05 MG/ML IJ SOLN: 50.0000 ug | INTRAMUSCULAR | Status: DC | PRN

## 2012-10-28 MED ORDER — DEXAMETHASONE SODIUM PHOSPHATE 4 MG/ML IJ SOLN
INTRAMUSCULAR | Status: DC | PRN
  Administered 2012-10-28: 10 mg via INTRAVENOUS

## 2012-10-28 MED ORDER — OXYCODONE-ACETAMINOPHEN 5-325 MG PO TABS: 1.0000 | ORAL_TABLET | ORAL | Status: DC | PRN

## 2012-10-28 MED ORDER — ONDANSETRON HCL 4 MG/2ML IJ SOLN
INTRAMUSCULAR | Status: DC | PRN
  Administered 2012-10-28: 4 mg via INTRAVENOUS

## 2012-10-28 MED ORDER — LIDOCAINE HCL (CARDIAC) 20 MG/ML IV SOLN
INTRAVENOUS | Status: DC | PRN
  Administered 2012-10-28: 100 mg via INTRAVENOUS

## 2012-10-28 MED ORDER — FENTANYL CITRATE 0.05 MG/ML IJ SOLN
INTRAMUSCULAR | Status: DC | PRN
  Administered 2012-10-28 (×2): 50 ug via INTRAVENOUS

## 2012-10-28 MED ORDER — MIDAZOLAM HCL 2 MG/2ML IJ SOLN: 1.0000 mg | INTRAMUSCULAR | Status: DC | PRN

## 2012-10-28 MED ORDER — DEXTROSE 5 % IV SOLN
3.0000 g | INTRAVENOUS | Status: AC
  Administered 2012-10-28: 3 g via INTRAVENOUS

## 2012-10-28 MED ORDER — HEPARIN (PORCINE) IN NACL 2-0.9 UNIT/ML-% IJ SOLN
INTRAMUSCULAR | Status: DC | PRN
  Administered 2012-10-28: 1 via INTRAVENOUS

## 2012-10-28 MED ORDER — BUPIVACAINE HCL 0.25 % IJ SOLN
INTRAMUSCULAR | Status: DC | PRN
  Administered 2012-10-28: 10 mL

## 2012-10-28 MED ORDER — ONDANSETRON 8 MG PO TBDP
8.0000 mg | ORAL_TABLET | Freq: Once | ORAL | Status: AC
  Administered 2012-10-28: 8 mg via ORAL

## 2012-10-28 MED ORDER — LACTATED RINGERS IV SOLN
INTRAVENOUS | Status: DC
  Administered 2012-10-28: 13:00:00 via INTRAVENOUS

## 2012-10-28 SURGICAL SUPPLY — 61 items
ADH SKN CLS APL DERMABOND .7 (GAUZE/BANDAGES/DRESSINGS) ×1
APL SKNCLS STERI-STRIP NONHPOA (GAUZE/BANDAGES/DRESSINGS)
BAG DECANTER FOR FLEXI CONT (MISCELLANEOUS) ×2 IMPLANT
BENZOIN TINCTURE PRP APPL 2/3 (GAUZE/BANDAGES/DRESSINGS) IMPLANT
BLADE SURG 11 STRL SS (BLADE) ×2 IMPLANT
BLADE SURG 15 STRL LF DISP TIS (BLADE) ×1 IMPLANT
BLADE SURG 15 STRL SS (BLADE) ×2
CANISTER SUCTION 1200CC (MISCELLANEOUS) IMPLANT
CHLORAPREP W/TINT 26ML (MISCELLANEOUS) ×2 IMPLANT
CLEANER CAUTERY TIP 5X5 PAD (MISCELLANEOUS) ×1 IMPLANT
CLOTH BEACON ORANGE TIMEOUT ST (SAFETY) ×2 IMPLANT
COVER MAYO STAND STRL (DRAPES) ×2 IMPLANT
COVER TABLE BACK 60X90 (DRAPES) ×2 IMPLANT
DECANTER SPIKE VIAL GLASS SM (MISCELLANEOUS) IMPLANT
DERMABOND ADVANCED (GAUZE/BANDAGES/DRESSINGS) ×1
DERMABOND ADVANCED .7 DNX12 (GAUZE/BANDAGES/DRESSINGS) ×1 IMPLANT
DRAPE C-ARM 42X72 X-RAY (DRAPES) ×2 IMPLANT
DRAPE LAPAROSCOPIC ABDOMINAL (DRAPES) ×2 IMPLANT
DRAPE UTILITY XL STRL (DRAPES) ×2 IMPLANT
DRSG TEGADERM 2-3/8X2-3/4 SM (GAUZE/BANDAGES/DRESSINGS) IMPLANT
ELECT REM PT RETURN 9FT ADLT (ELECTROSURGICAL) ×2
ELECTRODE REM PT RTRN 9FT ADLT (ELECTROSURGICAL) ×1 IMPLANT
GAUZE SPONGE 4X4 12PLY STRL LF (GAUZE/BANDAGES/DRESSINGS) IMPLANT
GLOVE BIOGEL M 7.0 STRL (GLOVE) ×1 IMPLANT
GLOVE BIOGEL PI IND STRL 7.5 (GLOVE) IMPLANT
GLOVE BIOGEL PI IND STRL 8 (GLOVE) ×1 IMPLANT
GLOVE BIOGEL PI INDICATOR 7.5 (GLOVE) ×1
GLOVE BIOGEL PI INDICATOR 8 (GLOVE) ×1
GLOVE ECLIPSE 8.0 STRL XLNG CF (GLOVE) ×2 IMPLANT
GOWN PREVENTION PLUS XLARGE (GOWN DISPOSABLE) ×3 IMPLANT
IV HEPARIN 1000UNITS/500ML (IV SOLUTION) ×2 IMPLANT
IV KIT MINILOC 20X1 SAFETY (NEEDLE) IMPLANT
KIT PORT POWER 8FR ISP CVUE (Catheter) ×1 IMPLANT
NDL HYPO 25X1 1.5 SAFETY (NEEDLE) ×1 IMPLANT
NDL SAFETY ECLIPSE 18X1.5 (NEEDLE) IMPLANT
NDL SPNL 22GX3.5 QUINCKE BK (NEEDLE) IMPLANT
NEEDLE HYPO 18GX1.5 SHARP (NEEDLE)
NEEDLE HYPO 22GX1.5 SAFETY (NEEDLE) IMPLANT
NEEDLE HYPO 25X1 1.5 SAFETY (NEEDLE) ×2 IMPLANT
NEEDLE SPNL 22GX3.5 QUINCKE BK (NEEDLE) IMPLANT
PACK BASIN DAY SURGERY FS (CUSTOM PROCEDURE TRAY) ×2 IMPLANT
PAD CLEANER CAUTERY TIP 5X5 (MISCELLANEOUS) ×1
PENCIL BUTTON HOLSTER BLD 10FT (ELECTRODE) ×2 IMPLANT
SET SHEATH INTRODUCER 10FR (MISCELLANEOUS) IMPLANT
SHEATH COOK PEEL AWAY SET 9F (SHEATH) IMPLANT
SLEEVE SCD COMPRESS KNEE MED (MISCELLANEOUS) ×1 IMPLANT
SPONGE LAP 4X18 X RAY DECT (DISPOSABLE) ×1 IMPLANT
STRIP CLOSURE SKIN 1/2X4 (GAUZE/BANDAGES/DRESSINGS) IMPLANT
SUT MON AB 4-0 PC3 18 (SUTURE) ×2 IMPLANT
SUT PROLENE 2 0 CT2 30 (SUTURE) IMPLANT
SUT PROLENE 2 0 SH DA (SUTURE) ×2 IMPLANT
SUT SILK 2 0 TIES 17X18 (SUTURE)
SUT SILK 2-0 18XBRD TIE BLK (SUTURE) IMPLANT
SUT VIC AB 3-0 SH 27 (SUTURE) ×2
SUT VIC AB 3-0 SH 27X BRD (SUTURE) ×1 IMPLANT
SYR 5ML LUER SLIP (SYRINGE) ×2 IMPLANT
SYR CONTROL 10ML LL (SYRINGE) ×2 IMPLANT
TOWEL OR 17X24 6PK STRL BLUE (TOWEL DISPOSABLE) ×4 IMPLANT
TOWEL OR NON WOVEN STRL DISP B (DISPOSABLE) ×2 IMPLANT
TUBE CONNECTING 20X1/4 (TUBING) IMPLANT
YANKAUER SUCT BULB TIP NO VENT (SUCTIONS) IMPLANT

## 2012-10-28 NOTE — Progress Notes (Signed)
Xray completed.  Pt tolerated well.  Awaiting results.

## 2012-10-28 NOTE — Op Note (Signed)
Christine [name] [birth year] [id number] 10/20/2012  Preoperative diagnosis: PAC needed for chemotherapy and venous access  Postoperative diagnosis: Same  Procedure: Portacath Placement with fluoroscopy   Surgeon: Currie Paris, MD, FACS  Anesthesia: General  Clinical History and Indications: The patient is getting ready to begin chemotherapy for her cancer. She  needs a Port-A-Cath for venous access.  Description of Procedure: I have seen the patient in the holding area and confirmed the plans for the procedure as noted above. I reviewed the risks and complications again and the patient has no further questions. She wishes to proceed.   The patient was then taken to the operating room. After satisfactory LMA  anesthesia had been obtained the upper chest and lower neck were prepped and draped as a sterile field. The timeout was done.  The right subclavian vein was entered and the guidewire threaded into the superior vena cava right atrial area under fluoroscopic guidance. An incision was then made on the anterior chest wall and a subcutaneous pocket fashioned for the port reservoir.  The port tubing was then brought through a subcutaneous tunnel from the port site to the guidewire site. The dilator and peel-away sheath were then advanced over the guidewire while monitoring this with fluoroscopy. The guidewire and dilator were removed and the tubing threaded to approximately 18 cm. The peel-away sheath was then removed. The catheter aspirated and flushed easily. Using fluoroscopy the tip was backed out into the superior vena cava right atrial junction area. It aspirated and flushed easily. The reservoir was attached and the locking mechanism engaged. That aspirated and flushed easily.  The reservoir was secured to the fascia with 2 sutures of 2-0 Prolene. A final check with fluoroscopy was done to make sure we had no kinks and good positioning of the tip of the catheter. Everything appeared  to be okay. The catheter was aspirated, flushed with dilute heparin and then concentrated aqueous heparin.  The incision was then closed with interrupted 3-0 Vicryl, and 4-0 Monocryl subcuticular with Dermabond on the skin.  There were no operative complications. Estimated blood loss was minimal. All counts were correct. The patient tolerated the procedure well.  Dortha Schwalbe, MD, FACS 10/28/2012 1:50 PM

## 2012-10-28 NOTE — Interval H&P Note (Signed)
History and Physical Interval Note:  10/28/2012 12:12 PM  Christine [name]  has presented today for surgery, with the diagnosis of Breast Cancer  The various methods of treatment have been discussed with the patient and family. After consideration of risks, benefits and other options for treatment, the patient has consented to  Procedure(s): PORT PLACEMENT (Right) as a surgical intervention .  The patient's history has been reviewed, patient examined, no change in status, stable for surgery.  I have reviewed the patient's chart and labs.  Questions were answered to the patient's satisfaction.     [name]

## 2012-10-28 NOTE — Anesthesia Preprocedure Evaluation (Signed)
Anesthesia Evaluation  Patient identified by MRN, date of birth, ID band Patient awake    Reviewed: Allergy & Precautions, H&P , NPO status , Patient's Chart, lab work & pertinent test results  Airway Mallampati: I TM Distance: >3 FB Neck ROM: Full    Dental   Pulmonary          Cardiovascular hypertension, Pt. on medications     Neuro/Psych    GI/Hepatic GERD-  Medicated and Controlled,  Endo/Other  Hypothyroidism   Renal/GU      Musculoskeletal   Abdominal   Peds  Hematology   Anesthesia Other Findings   Reproductive/Obstetrics                           Anesthesia Physical Anesthesia Plan  ASA: II  Anesthesia Plan: MAC   Post-op Pain Management:    Induction: Intravenous  Airway Management Planned: Simple Face Mask  Additional Equipment:   Intra-op Plan:   Post-operative Plan:   Informed Consent: I have reviewed the patients History and Physical, chart, labs and discussed the procedure including the risks, benefits and alternatives for the proposed anesthesia with the patient or authorized representative who has indicated his/her understanding and acceptance.     Plan Discussed with: CRNA and Surgeon  Anesthesia Plan Comments:         Anesthesia Quick Evaluation

## 2012-10-28 NOTE — H&P (View-Only) (Signed)
Patient ID: Christine [name], female   DOB: Feb 09, 1956, 57 y.o.   MRN: [id number]  No chief complaint on file.   HPI Christine [name] is a 57 y.o. female.  Patient sent at the request of Dr. Zella Ball for a left breast cancer. Over the last couple of months the patient's noticed that her nipple has become more itchy and retracted. She will underwent mammography with ultrasound that showed 2 areas in the left breast one at 12:00 and the second at 3:00 that appeared by ultrasound to least the 6 cm in maximal diameter and also multiple left axillary lymph nodes that were abnormal. One node was biopsied was positive for metastatic breast cancer. The other 2 areas were biopsied and the breast itself and were positive for invasive mammary carcinoma. HPI  Past Medical History  Diagnosis Date  . ALLERGIC RHINITIS   . GERD (gastroesophageal reflux disease)   . Hypertension   . Hypothyroidism   . Hx of colonic polyps     Past Surgical History  Procedure Laterality Date  . Tonsillectomy    . Caesarean section      x 1  . Abdominal hysterectomy  06/1988  . Mastectomy  04/1987    right side    Family History  Problem Relation Age of Onset  . Colon cancer Mother     family hx of colon ca 1st degree relative <60  . Coronary artery disease Father     family hx of CAD female 1st degree relative <50  . Stomach cancer Neg Hx   . Rectal cancer Neg Hx   . Esophageal cancer Neg Hx     Social History History  Substance Use Topics  . Smoking status: Never Smoker   . Smokeless tobacco: Former Neurosurgeon  . Alcohol Use: Yes     Comment: social    Allergies  Allergen Reactions  . Sulfa Antibiotics   . Sulfonamide Derivatives Hives, Itching and Swelling    Current Outpatient Prescriptions  Medication Sig Dispense Refill  . aspirin 81 MG tablet Take 81 mg by mouth daily.      Marland Kitchen diltiazem (CARDIZEM CD) 240 MG 24 hr capsule Take 1 capsule (240 mg total) by mouth daily.  90 capsule  2  .  hydrochlorothiazide (MICROZIDE) 12.5 MG capsule TAKE 1 CAPSULE (12.5 MG TOTAL) BY MOUTH EVERY MORNING.  90 capsule  2  . latanoprost (XALATAN) 0.005 % ophthalmic solution Place 1 drop into both eyes at bedtime.       Marland Kitchen levothyroxine (SYNTHROID, LEVOTHROID) 50 MCG tablet TAKE 1 TABLET (50 MCG TOTAL) BY MOUTH DAILY.  90 tablet  2  . NEXIUM 40 MG capsule TAKE 1 CAPSULE (40 MG TOTAL) BY MOUTH DAILY BEFORE BREAKFAST.  90 capsule  1   No current facility-administered medications for this visit.    Review of Systems Review of Systems  Constitutional: Negative.   HENT: Negative.   Eyes: Negative.   Respiratory: Negative.   Cardiovascular: Negative.   Gastrointestinal: Negative.   Endocrine: Negative.   Genitourinary: Negative.   Allergic/Immunologic: Negative.   Neurological: Negative.   Hematological: Negative.   Psychiatric/Behavioral: Negative.     Blood pressure 140/88, pulse 88, temperature 97.4 F (36.3 C), resp. rate 14, height 5\' 4"  (1.626 m), weight 222 lb (100.699 kg).  Physical Exam Physical Exam  Constitutional: She is oriented to person, place, and time. She appears well-developed and well-nourished.  HENT:  Head: Normocephalic and atraumatic.  Eyes: EOM are normal.  Pupils are equal, round, and reactive to light.  Neck: Normal range of motion. Neck supple.  Cardiovascular: Normal rate and regular rhythm.   Pulmonary/Chest: Effort normal and breath sounds normal. Left breast exhibits inverted nipple, mass, skin change and tenderness. Left breast exhibits no nipple discharge. Breasts are asymmetrical.    Musculoskeletal: Normal range of motion.  Lymphadenopathy:    She has axillary adenopathy.       Left axillary: Lateral adenopathy present.  Neurological: She is alert and oriented to person, place, and time.  Skin: Skin is dry.  Psychiatric: She has a normal mood and affect. Her behavior is normal. Thought content normal.    Data Reviewed Clinical Data: Diffuse  skin thickening involving the left areola  and nipple for the past 2 weeks. Status post right mastectomy for  breast cancer 25 years ago.  DIGITAL DIAGNOSTIC LEFT MAMMOGRAM WITH CAD AND LEFT BREAST  ULTRASOUND:  Comparison: Previous examinations at [facility], the  most recent dated 06/02/2005.  Findings:  ACR Breast Density Category 3: The breast tissue is heterogeneously  dense.  Interval diffuse left anterior and medial breast skin thickening,  most pronounced involving the areola. There is also interval mild  edema within the breast parenchyma. Also noted is interval  enlargement of multiple left axillary lymph nodes.  Mammographic images were processed with CAD.  On physical exam, the left breast is diffusely somewhat firm to  palpation with diffuse skin thickening and stiffness involving the  areola and nipple. There is also a faintly palpable lymph node in  the inferior left axilla, laterally.  Ultrasound is performed, showing a large irregular hypoechoic area  in the 12 o'clock position of the left breast, 5 cm from the  nipple. This cannot be included in its entirety in one field of  view, measuring at least 6.7 x 2.7 x 2.6 cm in maximum dimensions.  There is a similar area in the 1:30 o'clock position of the left  breast, 17 cm from the nipple, measuring 4.4 x 3.8 x 2.3 cm. Both  of these areas demonstrate posterior acoustical shadowing.  Also demonstrated are multiple enlarged, rounded, diffusely  hypoechoic left axillary lymph nodes. The largest measures 2.3 cm  in maximum diameter.  IMPRESSION:  Left breast skin thickening, edema and masses masses and abnormal  left axillary lymph nodes, as described above. This combination of  findings is highly suspicious for areas of primary breast cancer  with metastatic adenopathy. Ultrasound-guided core needle biopsy  of the mass in the 12 o'clock position of the left breast, mass in  the 1:30 o'clock position of the  left breast and one of the  abnormal left axillary lymph nodes is recommended. This has been  discussed with the patient and scheduled for 9:30 a.m. on  09/30/2012.  RECOMMENDATION:  Left breast ultrasound guided core needle biopsies (scheduled).  I have discussed the findings and recommendations with the patient.  Results were also provided in writing at the conclusion of the  visit.  BI-RADS CATEGORY 5: Highly suggestive of malignancy - appropriate  action should be taken.  Original Report Authenticated By: Beckie Salts, M.D.   Assessment    Left breast cancer locally advanced    Plan    MRI Oncology referral RTC to discuss options once MRI done since size is difficult to determine by U/S and given positive node and potential large size ,  Neoadjuvant chemotherapy may be first step.       CORNETT,THOMAS A.  10/05/2012, 3:51 PM

## 2012-10-28 NOTE — Anesthesia Procedure Notes (Signed)
Procedure Name: LMA Insertion Date/Time: 10/28/2012 1:01 PM Performed by: Gar Gibbon Pre-anesthesia Checklist: Patient identified, Emergency Drugs available, Suction available and Patient being monitored Patient Re-evaluated:Patient Re-evaluated prior to inductionOxygen Delivery Method: Circle System Utilized Preoxygenation: Pre-oxygenation with 100% oxygen Intubation Type: IV induction Ventilation: Mask ventilation without difficulty LMA: LMA inserted LMA Size: 4.0 Number of attempts: 1 Airway Equipment and Method: bite block Placement Confirmation: positive ETCO2 Tube secured with: Tape Dental Injury: Teeth and Oropharynx as per pre-operative assessment

## 2012-10-28 NOTE — Transfer of Care (Signed)
Immediate Anesthesia Transfer of Care Note  Patient: Christine [name]  Procedure(s) Performed: Procedure(s) with comments: PORT PLACEMENT (Right) - Right Subclavian Vein  Patient Location: PACU  Anesthesia Type:General  Level of Consciousness: awake, sedated and patient cooperative  Airway & Oxygen Therapy: Patient Spontanous Breathing and Patient connected to face mask oxygen  Post-op Assessment: Report given to PACU RN and Post -op Vital signs reviewed and stable  Post vital signs: Reviewed and stable  Complications: No apparent anesthesia complications

## 2012-10-28 NOTE — Anesthesia Postprocedure Evaluation (Signed)
Anesthesia Post Note  Patient: Christine [name]  Procedure(s) Performed: Procedure(s) (LRB): PORT PLACEMENT (Right)  Anesthesia type: general  Patient location: PACU  Post pain: Pain level controlled  Post assessment: Patient's Cardiovascular Status Stable  Last Vitals:  Filed Vitals:   10/28/12 1530  BP: 134/66  Pulse: 83  Temp: 37 C  Resp: 16    Post vital signs: Reviewed and stable  Level of consciousness: sedated  Complications: No apparent anesthesia complications

## 2012-10-29 ENCOUNTER — Ambulatory Visit
Admission: RE | Admit: 2012-10-29 | Discharge: 2012-10-29 | Disposition: A | Discharge status: Home / Self Care | Payer: BC Managed Care – PPO | Source: Ambulatory Visit | Attending: Radiation Oncology | Admitting: Radiation Oncology

## 2012-10-29 ENCOUNTER — Encounter: Payer: Self-pay | Admitting: Radiation Oncology

## 2012-10-29 VITALS — BP 142/85 | HR 97 | Temp 98.5°F | Resp 20 | Wt 223.6 lb

## 2012-10-29 DIAGNOSIS — Z9010 Acquired absence of unspecified breast and nipple: Secondary | ICD-10-CM | POA: Insufficient documentation

## 2012-10-29 DIAGNOSIS — I10 Essential (primary) hypertension: Secondary | ICD-10-CM | POA: Insufficient documentation

## 2012-10-29 DIAGNOSIS — K219 Gastro-esophageal reflux disease without esophagitis: Secondary | ICD-10-CM | POA: Insufficient documentation

## 2012-10-29 DIAGNOSIS — E039 Hypothyroidism, unspecified: Secondary | ICD-10-CM | POA: Insufficient documentation

## 2012-10-29 DIAGNOSIS — C773 Secondary and unspecified malignant neoplasm of axilla and upper limb lymph nodes: Secondary | ICD-10-CM | POA: Insufficient documentation

## 2012-10-29 DIAGNOSIS — C50912 Malignant neoplasm of unspecified site of left female breast: Secondary | ICD-10-CM

## 2012-10-29 DIAGNOSIS — Z79899 Other long term (current) drug therapy: Secondary | ICD-10-CM | POA: Insufficient documentation

## 2012-10-29 DIAGNOSIS — Z7982 Long term (current) use of aspirin: Secondary | ICD-10-CM | POA: Insufficient documentation

## 2012-10-29 DIAGNOSIS — C50919 Malignant neoplasm of unspecified site of unspecified female breast: Secondary | ICD-10-CM | POA: Insufficient documentation

## 2012-10-29 NOTE — Progress Notes (Signed)
Please see the Nurse Progress Note in the MD Initial Consult Encounter for this patient. 

## 2012-10-29 NOTE — Progress Notes (Signed)
Pt denies pain, fatigue, loss of appetite. She is married, 1 daughter age 57. Husband w/pt today.

## 2012-10-29 NOTE — Progress Notes (Signed)
Radiation Oncology         [phone]) [phone] ________________________________  Initial outpatient Consultation - Date: 10/29/2012   Name: Christine [name] MRN: [id number]   DOB: [birth year]  REFERRING PHYSICIAN: Victorino December, MD  DIAGNOSIS: The encounter diagnosis was Breast cancer, left.  HISTORY OF PRESENT ILLNESS::Christine [name] is a 57 y.o. female  who has a history of a right mastectomy. 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 She underwent a right mastectomy but no further chemotherapy or radiation. She did not receive any antiestrogen therapy. She did well until she noticed her left nipple becoming itchy and retracted. She went underwent a mammogram and ultrasound confirmed the presence of 2 left breast masses. An ultrasound confirmed a 6 cm mass and multiple left axillary lymph nodes. 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 An MRI of the bilateral breast showed diffuse skin enhancement and thickening with dermal enhancement and a mass measuring 13 x 12.5 x 9.5 cm. Multiple enlarged level I axillary lymph nodes were noted. An index node measured 2.4 cm. The breast extended posteriorly to abut the pectoralis muscle. No evidence of internal mammary or axillary adenopathy was noted. She had a PET scan on Wednesday which showed intense hypermetabolic activity with the left breast mass. Left axillary lymph nodes were noted extending into the subpectoral region. A hypermetabolic internal mammary lymph node was noted on the left side. No evidence of metastatic disease was noted. She has been referred to genetic testing. She had her Port-A-Cath placed yesterday and is scheduled to begin chemotherapy next week. Her nipple continues to be itchy and irritated. She has no bone pain or headaches. She is GX P2 and never took hormone replacement therapy.  PREVIOUS RADIATION THERAPY: No  PAST MEDICAL HISTORY:  has a  past medical history of ALLERGIC RHINITIS; GERD (gastroesophageal reflux disease); Hypertension; Hypothyroidism; colonic polyps; Wears glasses; Breast cancer (1988, age 39); and Breast cancer (09/30/12).    PAST SURGICAL HISTORY: Past Surgical History  Procedure Laterality Date  . Tonsillectomy    . Caesarean section      x 1  . Mastectomy  04/1987    right side  . Abdominal hysterectomy  06/1988    fibroids  . Portacath placement Right 10/28/2012    Procedure: PORT PLACEMENT;  Surgeon: Clovis Pu. Cornett, MD;  Location: [facility];  Service: General;  Laterality: Right;  Right Subclavian Vein    FAMILY HISTORY:  Family History  Problem Relation Age of Onset  . Colon cancer Mother     family hx of colon ca 1st degree relative <60  . Coronary artery disease Father     family hx of CAD female 1st degree relative <50  . Stomach cancer Neg Hx   . Rectal cancer Neg Hx   . Esophageal cancer Neg Hx   . Cancer Maternal Grandmother 44    breast  . Diabetes Paternal Grandmother   . Cancer Cousin     breast, maternal    SOCIAL HISTORY:  History  Substance Use Topics  . Smoking status: Never Smoker   . Smokeless tobacco: Never Used  . Alcohol Use: Yes     Comment: social    ALLERGIES: Sulfa antibiotics and Sulfonamide derivatives  MEDICATIONS:  Current Outpatient Prescriptions  Medication Sig Dispense Refill  . aspirin 81 MG tablet Take 81 mg by mouth daily.      Marland Kitchen  dexamethasone (DECADRON) 4 MG tablet Take 2 tablets by mouth once a day on the day after chemotherapy and then take 2 tablets two times a day for 2 days. Take with food.  30 tablet  1  . diltiazem (CARDIZEM CD) 240 MG 24 hr capsule Take 1 capsule (240 mg total) by mouth daily.  90 capsule  2  . hydrochlorothiazide (MICROZIDE) 12.5 MG capsule TAKE 1 CAPSULE (12.5 MG TOTAL) BY MOUTH EVERY MORNING.  90 capsule  2  . latanoprost (XALATAN) 0.005 % ophthalmic solution Place 1 drop into both eyes at bedtime.         Marland Kitchen levothyroxine (SYNTHROID, LEVOTHROID) 50 MCG tablet TAKE 1 TABLET (50 MCG TOTAL) BY MOUTH DAILY.  90 tablet  2  . lidocaine-prilocaine (EMLA) cream Apply topically as needed.  30 g  6  . LORazepam (ATIVAN) 0.5 MG tablet Take 1 tablet (0.5 mg total) by mouth every 6 (six) hours as needed (Nausea or vomiting).  30 tablet  0  . NEXIUM 40 MG capsule TAKE 1 CAPSULE (40 MG TOTAL) BY MOUTH DAILY BEFORE BREAKFAST.  90 capsule  1  . ondansetron (ZOFRAN) 8 MG tablet Take 1 tablet (8 mg total) by mouth 2 (two) times daily as needed. Take two times a day as needed for nausea or vomiting starting on the third day after chemotherapy.  30 tablet  1  . oxyCODONE-acetaminophen (ROXICET) 5-325 MG per tablet Take 1 tablet by mouth every 4 (four) hours as needed for pain.  30 tablet  0  . potassium chloride SA (K-DUR,KLOR-CON) 20 MEQ tablet Take 1 tablet (20 mEq total) by mouth 2 (two) times daily. For 7 days  14 tablet  0  . prochlorperazine (COMPAZINE) 10 MG tablet Take 1 tablet (10 mg total) by mouth every 6 (six) hours as needed (Nausea or vomiting).  30 tablet  1  . prochlorperazine (COMPAZINE) 25 MG suppository Place 1 suppository (25 mg total) rectally every 12 (twelve) hours as needed for nausea.  12 suppository  3   No current facility-administered medications for this encounter.    REVIEW OF SYSTEMS:  A 15 point review of systems is documented in the electronic medical record. This was obtained by the nursing staff. However, I reviewed this with the patient to discuss relevant findings and make appropriate changes.  Pertinent items are noted in HPI.   PHYSICAL EXAM:  Filed Vitals:   10/29/12 0829  BP: 142/85  Pulse: 97  Temp: 98.5 F (36.9 C)  Resp: 20  . She is a pleasant female in no distress sitting comfortably examining table. She is status post right mastectomy with no visible evidence or palpable evidence of tumor recurrence along the mastectomy scar. She has no palpable cervical  supraclavicular or axillary adenopathy on the right. Examination of the left breast reveals a markedly abnormal breast. The breast appears engorged. There is no ulceration through the skin however there is significant bruising right up to the skin. I am not able to palpate any nodes in the left axilla. She is alert minus x3.  LABORATORY DATA:  Lab Results  Component Value Date   WBC 7.1 10/19/2012   HGB 13.9 10/28/2012   HCT 41.0 10/28/2012   MCV 90.0 10/19/2012   PLT 213 10/19/2012   Lab Results  Component Value Date   NA 144 10/28/2012   K 3.9 10/28/2012   CL 108 10/28/2012   CO2 31* 10/19/2012   Lab Results  Component Value Date  ALT 29 10/19/2012   AST 27 10/19/2012   ALKPHOS 79 10/19/2012   BILITOT 0.42 10/19/2012     RADIOGRAPHY: Ct Chest W Contrast  10/27/2012  *RADIOLOGY REPORT*  Clinical Data:  High risk breast cancer staging.  Right breast cancer diagnosed 1988.  Left breast cancer diagnosed February 2014.  CT CHEST, ABDOMEN AND PELVIS WITH CONTRAST  Technique:  Multidetector CT imaging of the chest, abdomen and pelvis was performed following the standard protocol during bolus administration of intravenous contrast.  Contrast: OMNIPAQUE IOHEXOL 300 MG/ML  SOLN  Comparison:  PET CT scan 03/26 1014  CT CHEST  Findings:  Right breast mastectomy anatomy.  There is thickening of the skin of the left breast to 11 mm.  There is a mass within the deep posterior left breast measuring 29 x 30 mm (image 6).  There are enlarged round abnormal lymph nodes in the left axilla measuring up to 20 mm short axis (image 22).  Lymph nodes extend beneath the sub pectoralis muscles on the left (image 12).  There is no evidence of internal mammary lymphadenopathy.  No infraclavicular adenopathy.  No mediastinal adenopathy.  No pericardial fluid.  No suspicious pulmonary nodules.  IMPRESSION:  1.  Left breast mass consistent with breast carcinoma. 2.  Skin thickening over the left breast is concerning for  inflammatory breast cancer. 3.  Nodal metastasis in the left axilla and sub pectoralis nodal stations. 4.  No evidence of central nodal metastasis or mediastinal metastasis  CT ABDOMEN AND PELVIS  Findings:  No focal hepatic lesion.  The gallbladder, pancreas, spleen, adrenal gland is, and kidneys are normal.  The stomach, small bowel, appendix, cecum are normal.  The colon and rectosigmoid colon are normal.  Abdominal aorta is normal caliber.  No retroperitoneal or periportal lymphadenopathy.  No mesenteric or peritoneal disease.  No free fluid in  the pelvis.  Post hysterectomy anatomy.  No pelvic lymphadenopathy. Review of  bone windows demonstrates no aggressive osseous lesions.  IMPRESSION:  No evidence of metastasis in the abdomen or pelvis.   Original Report Authenticated By: Genevive Bi, M.D.    Ct Abdomen Pelvis W Contrast  10/27/2012  *RADIOLOGY REPORT*  Clinical Data:  High risk breast cancer staging.  Right breast cancer diagnosed 1988.  Left breast cancer diagnosed February 2014.  CT CHEST, ABDOMEN AND PELVIS WITH CONTRAST  Technique:  Multidetector CT imaging of the chest, abdomen and pelvis was performed following the standard protocol during bolus administration of intravenous contrast.  Contrast: OMNIPAQUE IOHEXOL 300 MG/ML  SOLN  Comparison:  PET CT scan 03/26 1014  CT CHEST  Findings:  Right breast mastectomy anatomy.  There is thickening of the skin of the left breast to 11 mm.  There is a mass within the deep posterior left breast measuring 29 x 30 mm (image 6).  There are enlarged round abnormal lymph nodes in the left axilla measuring up to 20 mm short axis (image 22).  Lymph nodes extend beneath the sub pectoralis muscles on the left (image 12).  There is no evidence of internal mammary lymphadenopathy.  No infraclavicular adenopathy.  No mediastinal adenopathy.  No pericardial fluid.  No suspicious pulmonary nodules.  IMPRESSION:  1.  Left breast mass consistent with breast  carcinoma. 2.  Skin thickening over the left breast is concerning for inflammatory breast cancer. 3.  Nodal metastasis in the left axilla and sub pectoralis nodal stations. 4.  No evidence of central nodal metastasis or mediastinal  metastasis  CT ABDOMEN AND PELVIS  Findings:  No focal hepatic lesion.  The gallbladder, pancreas, spleen, adrenal gland is, and kidneys are normal.  The stomach, small bowel, appendix, cecum are normal.  The colon and rectosigmoid colon are normal.  Abdominal aorta is normal caliber.  No retroperitoneal or periportal lymphadenopathy.  No mesenteric or peritoneal disease.  No free fluid in  the pelvis.  Post hysterectomy anatomy.  No pelvic lymphadenopathy. Review of  bone windows demonstrates no aggressive osseous lesions.  IMPRESSION:  No evidence of metastasis in the abdomen or pelvis.   Original Report Authenticated By: Genevive Bi, M.D.    Mr Breast Bilateral W Wo Contrast  10/12/2012  *RADIOLOGY REPORT*  Clinical Data: History of previous malignant mastectomy of the right breast 25 years ago.  The patient developed diffuse skin thickening and firmness within the left breast.  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  BUN and creatinine were obtained on site at [facility] at [address] Results:  BUN 7 mg/dL,  Creatinine 1.0 mg/dL.  BILATERAL BREAST MRI WITH AND WITHOUT CONTRAST  Technique: Multiplanar, multisequence MR images of both breasts were obtained prior to and following the intravenous administration of 20ml of Multihance.  Three dimensional images were evaluated at the independent DynaCad workstation.  Comparison:  Mammograms dated 09/30/2012 and 09/28/2012.  Findings: There has been a previous right mastectomy.  There is marked diffuse enhancement involving the majority of the left breast.  This measures 13.0 x 12.5 x 9.5 cm  in size.  This is associated with a mixture of plateau and washout enhancement kinetics.  In addition,  there is diffuse skin thickening and dermal enhancement.  There are multiple enlarged level I left axillary lymph nodes.  The largest lymph node measures 2.4 cm in size.  The large area of enhancement within the left breast extends posteriorly to abut the anterior portion of the pectoral muscle. There is no evidence for internal mammary adenopathy or right axillary adenopathy.  There are no additional findings.  IMPRESSION:  1.  Large ( 13 cm) area of enhancement involving the majority of the left breast consistent with the patient's known invasive ductal carcinoma and DCIS. This extends posteriorly to abut the pectoralis muscle. 2.  Multiple enlarged level I left axillary lymph nodes. 3.  Previous right mastectomy.  RECOMMENDATION: Treatment plan  THREE-DIMENSIONAL MR IMAGE RENDERING ON INDEPENDENT WORKSTATION:  Three-dimensional MR images were rendered by post-processing of the original MR data on an independent workstation.  The three- dimensional MR images were interpreted, and findings were reported in the accompanying complete MRI report for this study.  BI-RADS CATEGORY 6:  Known biopsy-proven malignancy - appropriate action should be taken.   Original Report Authenticated By: Rolla Plate, M.D.    Nm Pet Image Initial (pi) Skull Base To Thigh  10/27/2012  *RADIOLOGY REPORT*  Clinical Data: Initial treatment strategy for high risk of breast cancer. Triple negative  NUCLEAR MEDICINE PET SKULL BASE TO THIGH  Fasting Blood Glucose:  97  Technique:  17.9 mCi F-18 FDG was injected intravenously. CT data was obtained and used for attenuation correction and anatomic localization only.  (This was not acquired as a diagnostic CT examination.) Additional exam technical data entered on technologist worksheet.  Comparison:  Findings:  Neck: No hypermetabolic lymph nodes in the neck.  Chest:  There is intense  metabolic activity (  SUV max = 13.7) associated with a large portion of the glandular tissue of the left breast consistent with carcinoma.  There are intensely hypermetabolic left axillary lymph nodes with SUV max = 15.1.  These nodes are rounded and measure up to 2 cm. Hypermetabolic nodes extend to the sub pectoralis nodal station (image 71).  There is a single hypermetabolic internal mammary lymph node measuring 5 mm just left of the sternum (image 87).  This has intense metabolic activity for size with SUV max = 7.1.  No hypermetabolic mediastinal lymph nodes.  No hypermetabolic pulmonary nodules.  Abdomen/Pelvis:  No abnormal hypermetabolic activity within the liver, pancreas, adrenal glands, or spleen.  No hypermetabolic lymph nodes in the abdomen or pelvis.  Skeleton:  No focal hypermetabolic activity to suggest skeletal metastasis.  IMPRESSION:  1.  Hypermetabolic breast tissue on the left consistent with primary carcinoma. 2.  Hypermetabolic nodal metastasis in the left axilla extending to the sub pectoralis nodes. 3.  Single hypermetabolic left internal mammary lymph node.  4.  No evidence of pulmonary metastasis.  5.  No evidence of metastasis beneath hemidiaphragms or within the skeleton.   Original Report Authenticated By: Genevive Bi, M.D.    Dg Chest Port 1 View  10/28/2012  *RADIOLOGY REPORT*  Clinical Data: Port-A-Cath placement.  PORTABLE CHEST - 1 VIEW  Comparison: None.  Findings: The patient is rotated on the study.  Right side Port-A- Cath is in place.  Tubing appears intact.  Tip of the catheter projects in the mid to lower superior vena cava.  No pneumothorax identified.  Heart size is normal.  Lungs are clear.  The patient is status post right mastectomy and axillary dissection.  IMPRESSION: Tip of Port-A-Cath projects over the mid to lower superior vena cava.  The patient is rotated on the study.  Exact position of the tip of the catheter could be determined with the lateral film. No  pneumothorax.   Original Report Authenticated By: Holley Dexter, M.D.    Mm Digital Diagnostic Unilat L  09/30/2012  *RADIOLOGY REPORT*  Clinical Data:  Status post ultrasound guided core biopsies of masses in the 12 o'clock and 3 o'clock locations of the left breast.  Clips were placed at the time of biopsy.  DIGITAL DIAGNOSTIC LEFT MAMMOGRAM  Comparison:  Previous exams.  Findings:  Films are performed following ultrasound guided biopsy of masses in the 12 o'clock and 3 o'clock locations of the left breast.  A cork shaped clip is identified in the 12 o'clock location.  The top hat shaped clip is identified in the 3 o'clock location, both in expected locations.  Clips are approximately 10 cm apart.  IMPRESSION: Tissue marker clips are in expected locations after biopsy.   Original Report Authenticated By: Norva Pavlov, M.D.    Mm Radiologist Eval And Mgmt  10/01/2012  *RADIOLOGY REPORT*  ESTABLISHED PATIENT OFFICE VISIT - LEVEL II [phone])  Chief Complaint:  The patient has diffuse abnormality throughout the left breast, skin thickening, and enlarged left axillary lymph nodes.  History of right mastectomy 25 years ago.  Status post ultrasound guided core biopsies of 12 o'clock, 3 o'clock locations in the left breast as well as left axilla.  History:  Overnight, the patient reports doing well.  She reports no significant pain at the biopsy sites.  Exam:  Biopsy sites are clean and dry.  No significant hematoma or ecchymosis.  Pathology: 1. Breast, left, needle core biopsy, 12 o'clock  - INVASIVE DUCTAL CARCINOMA.  2. Breast, left, needle core biopsy, 3 o'clock  - DUCTAL CARCINOMA IN SITU, GRADE III.  3. Lymph node, needle/core biopsy, left axilla  - ONE LYMPH NODE, POSITIVE FOR METASTATIC MAMMARY CARCINOMA  Assessment and Plan:  The pathology correlates well with the imaging and clinical appearance.  I have discussed the findings with the patient and her husband. Surgical consultation has been arranged  for the patient with Dr. Luisa Hart on 10/05/2012.  MRI was not arranged but can be scheduled as needed.  Educational materials were given to the patient, and questions were answered.   Original Report Authenticated By: Norva Pavlov, M.D.    Dg Fluoro Guide Cv Line-no Report  10/28/2012  CLINICAL DATA: [facility]   FLOURO GUIDE CV LINE  Fluoroscopy was utilized by the requesting physician.  No radiographic  interpretation.     [place] Lt Breast Bx W Loc Dev 1st Lesion Img Bx Spec US Guide  09/30/2012  *RADIOLOGY REPORT*  Clinical Data:  Diffuse abnormality throughout the left breast and enlarged left axillary lymph nodes. Two additional biopsies are performed on the same day and dictated separately.  ULTRASOUND GUIDED VACUUM ASSISTED CORE BIOPSY OF THE LEFT BREAST  Comparison: Previous exams.  I met with the patient and we discussed the procedure of ultrasound- guided biopsy, including benefits and alternatives.  We discussed the high likelihood of a successful procedure. We discussed the risks of the procedure including infection, bleeding, tissue injury, clip migration, and inadequate sampling.  Informed written consent was given.  Using sterile technique, 2% lidocaine ultrasound guidance and a 12 gauge vacuum assisted needle biopsy was performed of hypoechoic abnormality in the 12 o'clock location of the left breast using a lateral approach.  At the conclusion of the procedure, a a cork shaped tissue marker clip was deployed into the biopsy cavity. Follow-up 2-view mammogram was performed and dictated separately.  IMPRESSION: Ultrasound-guided biopsy of left breast lesion.  No apparent complications.   Original Report Authenticated By: Norva Pavlov, M.D.    [place] Lt Breast Bx W Loc Dev Ea Add Lesion Img Bx Spec US Guide  09/30/2012  *RADIOLOGY REPORT*  Clinical Data:  Diffuse abnormality of the left breast and enlarged left axillary lymph nodes.  Two additional biopsies are performed on the same day and  dictated separately.  ULTRASOUND GUIDED VACUUM ASSISTED CORE BIOPSY OF THE LEFT BREAST  Comparison: Previous exams.  I met with the patient and we discussed the procedure of ultrasound- guided biopsy, including benefits and alternatives.  We discussed the high likelihood of a successful procedure. We discussed the risks of the procedure including infection, bleeding, tissue injury, clip migration, and inadequate sampling.  Informed written consent was given.  Using sterile technique, 2% lidocaine ultrasound guidance and a 12 gauge vacuum assisted needle biopsy was performed of abnormality in the 3 o'clock location of the left breast using a lateral approach. At the conclusion of the procedure, a top hat tissue marker clip was deployed into the biopsy cavity.  Follow-up 2-view mammogram was performed and dictated separately.  IMPRESSION: Ultrasound-guided biopsy of left breast lesion.  No apparent complications.   Original Report Authenticated By: Norva Pavlov, M.D.    [place] Lt Breast Bx W Loc Dev Ea Add Lesion Img Bx Spec US Guide  09/30/2012  *RADIOLOGY REPORT*  Clinical Data:  Enlarged left axillary lymph node.  The patient has skin thickening and diffuse abnormality within the left breast. Three biopsies are performed on the same date, two of which are  dictated separately.  ULTRASOUND GUIDED CORE BIOPSY OF THE  LEFT AXILLA  Comparison: Previous exams.  I met with the patient and we discussed the procedure of ultrasound- guided biopsy, including benefits and alternatives.  We discussed the high likelihood of a successful procedure. We discussed the risks of the procedure, including infection, bleeding, tissue injury, clip migration, and inadequate sampling.  Informed written consent was given.  Using sterile technique 1% and 2% lidocaine, ultrasound guidance and a 14 gauge automated biopsy device, biopsy was performed of left axillary lymph node using a lateral approach.  IMPRESSION: Ultrasound guided biopsy of  left axillary lymph node.  No apparent complications.   Original Report Authenticated By: Norva Pavlov, M.D.      IMPRESSION: T4 N3 invasive ductal carcinoma the left breast triple negative in the setting of previous right breast cancer  PLAN: We discussed the indications for radiation in the setting of locally advanced disease. We discussed the process of simulation the placement tattoos. We discussed 33 treatments of radiation as an outpatient. We discussed skin redness darkness and fatigue as side effects during treatment. We discussed difficulties with reconstruction after receiving radiation. We discussed the use of breath will technique to spare her heart. Clearly we will need to treat her internal mammary nodes as well as comprehensive nodal irradiation given her upfront presentation. Particular care will have to be placed in treating the axillary nodes of subpectoral station. I will plan on seeing her back after chemotherapy is complete  I spent 60 minutes  face to face with the patient and more than 50% of that time was spent in counseling and/or coordination of care.   ------------------------------------------------  Lurline Hare, MD

## 2012-11-01 ENCOUNTER — Telehealth: Payer: Self-pay | Admitting: Oncology

## 2012-11-01 ENCOUNTER — Other Ambulatory Visit (HOSPITAL_BASED_OUTPATIENT_CLINIC_OR_DEPARTMENT_OTHER): Payer: BC Managed Care – PPO | Admitting: Lab

## 2012-11-01 ENCOUNTER — Encounter: Payer: Self-pay | Admitting: Oncology

## 2012-11-01 ENCOUNTER — Ambulatory Visit (HOSPITAL_COMMUNITY)
Admission: RE | Admit: 2012-11-01 | Discharge: 2012-11-01 | Disposition: A | Discharge status: Home / Self Care | Payer: BC Managed Care – PPO | Source: Ambulatory Visit | Attending: Oncology | Admitting: Oncology

## 2012-11-01 ENCOUNTER — Ambulatory Visit (HOSPITAL_BASED_OUTPATIENT_CLINIC_OR_DEPARTMENT_OTHER): Payer: BC Managed Care – PPO | Admitting: Oncology

## 2012-11-01 ENCOUNTER — Ambulatory Visit (HOSPITAL_BASED_OUTPATIENT_CLINIC_OR_DEPARTMENT_OTHER): Payer: BC Managed Care – PPO | Admitting: Genetic Counselor

## 2012-11-01 VITALS — BP 159/81 | HR 87 | Temp 98.5°F | Resp 20 | Ht 64.0 in | Wt 218.0 lb

## 2012-11-01 DIAGNOSIS — F411 Generalized anxiety disorder: Secondary | ICD-10-CM | POA: Insufficient documentation

## 2012-11-01 DIAGNOSIS — R21 Rash and other nonspecific skin eruption: Secondary | ICD-10-CM | POA: Insufficient documentation

## 2012-11-01 DIAGNOSIS — C50919 Malignant neoplasm of unspecified site of unspecified female breast: Secondary | ICD-10-CM

## 2012-11-01 DIAGNOSIS — K219 Gastro-esophageal reflux disease without esophagitis: Secondary | ICD-10-CM | POA: Insufficient documentation

## 2012-11-01 DIAGNOSIS — C50912 Malignant neoplasm of unspecified site of left female breast: Secondary | ICD-10-CM

## 2012-11-01 DIAGNOSIS — Z171 Estrogen receptor negative status [ER-]: Secondary | ICD-10-CM

## 2012-11-01 DIAGNOSIS — Z800 Family history of malignant neoplasm of digestive organs: Secondary | ICD-10-CM

## 2012-11-01 DIAGNOSIS — R7309 Other abnormal glucose: Secondary | ICD-10-CM | POA: Insufficient documentation

## 2012-11-01 DIAGNOSIS — L719 Rosacea, unspecified: Secondary | ICD-10-CM | POA: Insufficient documentation

## 2012-11-01 DIAGNOSIS — IMO0002 Reserved for concepts with insufficient information to code with codable children: Secondary | ICD-10-CM

## 2012-11-01 DIAGNOSIS — Z803 Family history of malignant neoplasm of breast: Secondary | ICD-10-CM

## 2012-11-01 DIAGNOSIS — J069 Acute upper respiratory infection, unspecified: Secondary | ICD-10-CM | POA: Insufficient documentation

## 2012-11-01 DIAGNOSIS — Z9010 Acquired absence of unspecified breast and nipple: Secondary | ICD-10-CM

## 2012-11-01 DIAGNOSIS — F3289 Other specified depressive episodes: Secondary | ICD-10-CM | POA: Insufficient documentation

## 2012-11-01 DIAGNOSIS — D2360 Other benign neoplasm of skin of unspecified upper limb, including shoulder: Secondary | ICD-10-CM | POA: Insufficient documentation

## 2012-11-01 DIAGNOSIS — Z853 Personal history of malignant neoplasm of breast: Secondary | ICD-10-CM

## 2012-11-01 DIAGNOSIS — E039 Hypothyroidism, unspecified: Secondary | ICD-10-CM | POA: Insufficient documentation

## 2012-11-01 DIAGNOSIS — M542 Cervicalgia: Secondary | ICD-10-CM | POA: Insufficient documentation

## 2012-11-01 DIAGNOSIS — E876 Hypokalemia: Secondary | ICD-10-CM | POA: Insufficient documentation

## 2012-11-01 DIAGNOSIS — R55 Syncope and collapse: Secondary | ICD-10-CM | POA: Insufficient documentation

## 2012-11-01 DIAGNOSIS — F329 Major depressive disorder, single episode, unspecified: Secondary | ICD-10-CM | POA: Insufficient documentation

## 2012-11-01 DIAGNOSIS — Z6837 Body mass index (BMI) 37.0-37.9, adult: Secondary | ICD-10-CM | POA: Insufficient documentation

## 2012-11-01 DIAGNOSIS — Z01818 Encounter for other preprocedural examination: Secondary | ICD-10-CM | POA: Insufficient documentation

## 2012-11-01 DIAGNOSIS — I10 Essential (primary) hypertension: Secondary | ICD-10-CM | POA: Insufficient documentation

## 2012-11-01 LAB — CBC WITH DIFFERENTIAL/PLATELET
Basophils Absolute: 0 10*3/uL (ref 0.0–0.1)
EOS%: 1.5 % (ref 0.0–7.0)
HCT: 40.3 % (ref 34.8–46.6)
HGB: 13.2 g/dL (ref 11.6–15.9)
MCH: 29.8 pg (ref 25.1–34.0)
MCV: 91.2 fL (ref 79.5–101.0)
MONO%: 9.1 % (ref 0.0–14.0)
NEUT%: 48.2 % (ref 38.4–76.8)
Platelets: 188 10*3/uL (ref 145–400)
lymph#: 2.9 10*3/uL (ref 0.9–3.3)

## 2012-11-01 LAB — COMPREHENSIVE METABOLIC PANEL (CC13)
AST: 22 U/L (ref 5–34)
BUN: 11.1 mg/dL (ref 7.0–26.0)
Calcium: 10 mg/dL (ref 8.4–10.4)
Chloride: 104 mEq/L (ref 98–107)
Creatinine: 0.8 mg/dL (ref 0.6–1.1)

## 2012-11-01 NOTE — Patient Instructions (Addendum)
Proceed with chemotherapy on 11/02/12  We will see you back in 1 week for follow up

## 2012-11-01 NOTE — Progress Notes (Signed)
  Echocardiogram 2D Echocardiogram has been performed.  Christine [name] 11/01/2012, 1:08 PM

## 2012-11-01 NOTE — Progress Notes (Signed)
OFFICE PROGRESS NOTE  CC  Christine Mew, MD [address] [place] [address] Dr. Harriette [name]  DIAGNOSIS: 57 year old female with new diagnosis of invasive mammary carcinoma of the left breast diagnosed 09/30/2012  STAGE:  Left breast  13.0 x 12.5 x 9.5 cm diffuse skin thickening and dermal enhancement  Invasive ductal carcinoma and ductal carcinoma in situ  ER negative PR negative proliferation marker Ki-67 21% HER-2/neu showed no amplification with a ratio of 1.65.  PRIOR THERAPY: 1. Patient has history of right breast cancer patient underwent a mastectomy when she was 57 years old. This occurred during her pregnancy. After that patient did not take any kind of further treatments. She continued to do well.   2.Recently she noted changes in her nipple had become itchy and retracted. Because of this she underwent mammogram with ultrasound that showed 2 areas in the left breast one at 12:00 position and a second at 3:00 position. She also had an ultrasound performed that showed the diameter to be at least 6 cm and multiple left axillary lymph nodes that were abnormal. She then went on to have a biopsy performed on 09/30/2012. 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 Patient went on to have MRIs of the breasts performed and was noted to be marked diffuse enhancement involving majority of the left breast measuring 13.0 x 12.5 x 9.5 cm. There was diffuse skin thickening and dermal enhancement. Multiple enlarged level I left axillary lymph nodes. The largest lymph node measured 2.4 cm in size. The large area of enhancement within the left breast extends posteriorly to abut the anterior portion of the pectoralis muscle. There was no evidence of internal mammary adenopathy  or right axillary adenopathy. PET/CT did show the left breast mass, and left axillary and subpectoralis nodal metastases, but no further areas throughout the chest/abd/pelvis of metastases.   3. Patient currently undergoing neoadjuvant chemotherapy with FEC x 6 cycles starting on 11/02/12, which will be followed by 12 cycles of weekly Taxol/Carbo    CURRENT THERAPY: FEC cycle 1  INTERVAL HISTORY: Christine [name] 57 y.o. female returns for followup prior to starting neoadjuvant chemotherapy consisting of FEC to be given dose dense. Clinically she seems to be doing well without any problems. She denies any fevers chills night sweats headaches shortness of breath chest pains palpitations no myalgias and arthralgias. Remainder of the 10 point review of systems is negative.  MEDICAL HISTORY: Past Medical History  Diagnosis Date  . ALLERGIC RHINITIS   . GERD (gastroesophageal reflux disease)   . Hypertension   . Hypothyroidism   . Hx of colonic polyps   . Wears glasses   . Breast cancer 4, age 22    right  . Breast cancer 09/30/12    left, ER/PR -, Her 2 -    ALLERGIES:  is allergic to sulfa antibiotics and sulfonamide derivatives.  MEDICATIONS:  Current Outpatient Prescriptions  Medication Sig Dispense Refill  . diltiazem (CARDIZEM CD) 240 MG 24 hr capsule Take 1 capsule (240 mg total) by mouth daily.  90 capsule  2  . latanoprost (XALATAN) 0.005 % ophthalmic solution Place 1 drop into both eyes at bedtime.       Marland Kitchen levothyroxine (SYNTHROID, LEVOTHROID) 50 MCG tablet TAKE 1 TABLET (50 MCG  TOTAL) BY MOUTH DAILY.  90 tablet  2  . lidocaine-prilocaine (EMLA) cream Apply topically as needed.  30 g  6  . Multiple Vitamin (MULTIVITAMIN) tablet Take 1 tablet by mouth daily.      Marland Kitchen NEXIUM 40 MG capsule TAKE 1 CAPSULE (40 MG TOTAL) BY MOUTH DAILY BEFORE BREAKFAST.  90 capsule  1  . oxyCODONE-acetaminophen (ROXICET) 5-325 MG per tablet Take 1 tablet by mouth every 4 (four) hours as needed for  pain.  30 tablet  0  . PATADAY 0.2 % SOLN       . potassium chloride SA (K-DUR,KLOR-CON) 20 MEQ tablet Take 1 tablet (20 mEq total) by mouth 2 (two) times daily. For 7 days  14 tablet  0  . aspirin 81 MG tablet Take 81 mg by mouth daily.      Marland Kitchen dexamethasone (DECADRON) 4 MG tablet Take 2 tablets by mouth once a day on the day after chemotherapy and then take 2 tablets two times a day for 2 days. Take with food.  30 tablet  1  . hydrochlorothiazide (MICROZIDE) 12.5 MG capsule TAKE 1 CAPSULE (12.5 MG TOTAL) BY MOUTH EVERY MORNING.  90 capsule  2  . LORazepam (ATIVAN) 0.5 MG tablet Take 1 tablet (0.5 mg total) by mouth every 6 (six) hours as needed (Nausea or vomiting).  30 tablet  0  . ondansetron (ZOFRAN) 8 MG tablet Take 1 tablet (8 mg total) by mouth 2 (two) times daily as needed. Take two times a day as needed for nausea or vomiting starting on the third day after chemotherapy.  30 tablet  1  . prochlorperazine (COMPAZINE) 10 MG tablet Take 1 tablet (10 mg total) by mouth every 6 (six) hours as needed (Nausea or vomiting).  30 tablet  1  . prochlorperazine (COMPAZINE) 25 MG suppository Place 1 suppository (25 mg total) rectally every 12 (twelve) hours as needed for nausea.  12 suppository  3   No current facility-administered medications for this visit.    SURGICAL HISTORY:  Past Surgical History  Procedure Laterality Date  . Tonsillectomy    . Caesarean section      x 1  . Mastectomy  04/1987    right side  . Abdominal hysterectomy  06/1988    fibroids  . Portacath placement Right 10/28/2012    Procedure: PORT PLACEMENT;  Surgeon: Clovis Pu. Cornett, MD;  Location: [facility];  Service: General;  Laterality: Right;  Right Subclavian Vein    REVIEW OF SYSTEMS:  Pertinent items are noted in HPI.   HEALTH MAINTENANCE:  PHYSICAL EXAMINATION: Blood pressure 159/81, pulse 87, temperature 98.5 F (36.9 C), temperature source Oral, resp. rate 20, height 5\' 4"  (1.626 m),  weight 218 lb (98.884 kg). Body mass index is 37.4 kg/(m^2). ECOG PERFORMANCE STATUS: 0 - Asymptomatic   General appearance: alert, cooperative and appears stated age Resp: clear to auscultation bilaterally Back: symmetric, no curvature. ROM normal. No CVA tenderness. Cardio: regular rate and rhythm GI: soft, non-tender; bowel sounds normal; no masses,  no organomegaly Extremities: extremities normal, atraumatic, no cyanosis or edema Neurologic: Grossly normal   LABORATORY DATA: Lab Results  Component Value Date   WBC 7.2 11/01/2012   HGB 13.2 11/01/2012   HCT 40.3 11/01/2012   MCV 91.2 11/01/2012   PLT 188 11/01/2012      Chemistry      Component Value Date/Time   NA 142 11/01/2012 1010   NA 144 10/28/2012 1248  K 4.1 11/01/2012 1010   K 3.9 10/28/2012 1248   CL 104 11/01/2012 1010   CL 108 10/28/2012 1248   CO2 28 11/01/2012 1010   CO2 29 09/13/2011 1910   BUN 11.1 11/01/2012 1010   BUN 10 10/28/2012 1248   CREATININE 0.8 11/01/2012 1010   CREATININE 0.80 10/28/2012 1248      Component Value Date/Time   CALCIUM 10.0 11/01/2012 1010   CALCIUM 9.4 09/13/2011 1910   ALKPHOS 73 11/01/2012 1010   ALKPHOS 78 09/13/2011 1910   AST 22 11/01/2012 1010   AST 18 09/13/2011 1910   ALT 20 11/01/2012 1010   ALT 12 09/13/2011 1910   BILITOT 0.45 11/01/2012 1010   BILITOT 0.3 09/13/2011 1910       RADIOGRAPHIC STUDIES:  Ct Chest W Contrast  10/27/2012  *RADIOLOGY REPORT*  Clinical Data:  High risk breast cancer staging.  Right breast cancer diagnosed 1988.  Left breast cancer diagnosed February 2014.  CT CHEST, ABDOMEN AND PELVIS WITH CONTRAST  Technique:  Multidetector CT imaging of the chest, abdomen and pelvis was performed following the standard protocol during bolus administration of intravenous contrast.  Contrast: OMNIPAQUE IOHEXOL 300 MG/ML  SOLN  Comparison:  PET CT scan 03/26 1014  CT CHEST  Findings:  Right breast mastectomy anatomy.  There is thickening of the skin of the left breast to  11 mm.  There is a mass within the deep posterior left breast measuring 29 x 30 mm (image 6).  There are enlarged round abnormal lymph nodes in the left axilla measuring up to 20 mm short axis (image 22).  Lymph nodes extend beneath the sub pectoralis muscles on the left (image 12).  There is no evidence of internal mammary lymphadenopathy.  No infraclavicular adenopathy.  No mediastinal adenopathy.  No pericardial fluid.  No suspicious pulmonary nodules.  IMPRESSION:  1.  Left breast mass consistent with breast carcinoma. 2.  Skin thickening over the left breast is concerning for inflammatory breast cancer. 3.  Nodal metastasis in the left axilla and sub pectoralis nodal stations. 4.  No evidence of central nodal metastasis or mediastinal metastasis  CT ABDOMEN AND PELVIS  Findings:  No focal hepatic lesion.  The gallbladder, pancreas, spleen, adrenal gland is, and kidneys are normal.  The stomach, small bowel, appendix, cecum are normal.  The colon and rectosigmoid colon are normal.  Abdominal aorta is normal caliber.  No retroperitoneal or periportal lymphadenopathy.  No mesenteric or peritoneal disease.  No free fluid in  the pelvis.  Post hysterectomy anatomy.  No pelvic lymphadenopathy. Review of  bone windows demonstrates no aggressive osseous lesions.  IMPRESSION:  No evidence of metastasis in the abdomen or pelvis.   Original Report Authenticated By: Genevive Bi, M.D.    Ct Abdomen Pelvis W Contrast  10/27/2012  *RADIOLOGY REPORT*  Clinical Data:  High risk breast cancer staging.  Right breast cancer diagnosed 1988.  Left breast cancer diagnosed February 2014.  CT CHEST, ABDOMEN AND PELVIS WITH CONTRAST  Technique:  Multidetector CT imaging of the chest, abdomen and pelvis was performed following the standard protocol during bolus administration of intravenous contrast.  Contrast: OMNIPAQUE IOHEXOL 300 MG/ML  SOLN  Comparison:  PET CT scan 03/26 1014  CT CHEST  Findings:  Right breast  mastectomy anatomy.  There is thickening of the skin of the left breast to 11 mm.  There is a mass within the deep posterior left breast measuring 29 x 30 mm (  image 6).  There are enlarged round abnormal lymph nodes in the left axilla measuring up to 20 mm short axis (image 22).  Lymph nodes extend beneath the sub pectoralis muscles on the left (image 12).  There is no evidence of internal mammary lymphadenopathy.  No infraclavicular adenopathy.  No mediastinal adenopathy.  No pericardial fluid.  No suspicious pulmonary nodules.  IMPRESSION:  1.  Left breast mass consistent with breast carcinoma. 2.  Skin thickening over the left breast is concerning for inflammatory breast cancer. 3.  Nodal metastasis in the left axilla and sub pectoralis nodal stations. 4.  No evidence of central nodal metastasis or mediastinal metastasis  CT ABDOMEN AND PELVIS  Findings:  No focal hepatic lesion.  The gallbladder, pancreas, spleen, adrenal gland is, and kidneys are normal.  The stomach, small bowel, appendix, cecum are normal.  The colon and rectosigmoid colon are normal.  Abdominal aorta is normal caliber.  No retroperitoneal or periportal lymphadenopathy.  No mesenteric or peritoneal disease.  No free fluid in  the pelvis.  Post hysterectomy anatomy.  No pelvic lymphadenopathy. Review of  bone windows demonstrates no aggressive osseous lesions.  IMPRESSION:  No evidence of metastasis in the abdomen or pelvis.   Original Report Authenticated By: Genevive Bi, M.D.    Mr Breast Bilateral W Wo Contrast  10/12/2012  *RADIOLOGY REPORT*  Clinical Data: History of previous malignant mastectomy of the right breast 25 years ago.  The patient developed diffuse skin thickening and firmness within the left breast.  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  BUN and creatinine were obtained on site at [facility]  Imaging at [address] Results:  BUN 7 mg/dL,  Creatinine 1.0 mg/dL.  BILATERAL BREAST MRI WITH AND WITHOUT CONTRAST  Technique: Multiplanar, multisequence MR images of both breasts were obtained prior to and following the intravenous administration of 20ml of Multihance.  Three dimensional images were evaluated at the independent DynaCad workstation.  Comparison:  Mammograms dated 09/30/2012 and 09/28/2012.  Findings: There has been a previous right mastectomy.  There is marked diffuse enhancement involving the majority of the left breast.  This measures 13.0 x 12.5 x 9.5 cm in size.  This is associated with a mixture of plateau and washout enhancement kinetics.  In addition,  there is diffuse skin thickening and dermal enhancement.  There are multiple enlarged level I left axillary lymph nodes.  The largest lymph node measures 2.4 cm in size.  The large area of enhancement within the left breast extends posteriorly to abut the anterior portion of the pectoral muscle. There is no evidence for internal mammary adenopathy or right axillary adenopathy.  There are no additional findings.  IMPRESSION:  1.  Large ( 13 cm) area of enhancement involving the majority of the left breast consistent with the patient's known invasive ductal carcinoma and DCIS. This extends posteriorly to abut the pectoralis muscle. 2.  Multiple enlarged level I left axillary lymph nodes. 3.  Previous right mastectomy.  RECOMMENDATION: Treatment plan  THREE-DIMENSIONAL MR IMAGE RENDERING ON INDEPENDENT WORKSTATION:  Three-dimensional MR images were rendered by post-processing of the original MR data on an independent workstation.  The three- dimensional MR images were interpreted, and findings were reported in the accompanying complete MRI report for this study.  BI-RADS CATEGORY 6:  Known biopsy-proven malignancy - appropriate action should be taken.   Original Report Authenticated  By: Rolla Plate, M.D.    Nm Pet Image Initial  (pi) Skull Base To Thigh  10/27/2012  *RADIOLOGY REPORT*  Clinical Data: Initial treatment strategy for high risk of breast cancer. Triple negative  NUCLEAR MEDICINE PET SKULL BASE TO THIGH  Fasting Blood Glucose:  97  Technique:  17.9 mCi F-18 FDG was injected intravenously. CT data was obtained and used for attenuation correction and anatomic localization only.  (This was not acquired as a diagnostic CT examination.) Additional exam technical data entered on technologist worksheet.  Comparison:  Findings:  Neck: No hypermetabolic lymph nodes in the neck.  Chest:  There is intense metabolic activity ( SUV max = 16.1) associated with a large portion of the glandular tissue of the left breast consistent with carcinoma.  There are intensely hypermetabolic left axillary lymph nodes with SUV max = 15.1.  These nodes are rounded and measure up to 2 cm. Hypermetabolic nodes extend to the sub pectoralis nodal station (image 71).  There is a single hypermetabolic internal mammary lymph node measuring 5 mm just left of the sternum (image 87).  This has intense metabolic activity for size with SUV max = 7.1.  No hypermetabolic mediastinal lymph nodes.  No hypermetabolic pulmonary nodules.  Abdomen/Pelvis:  No abnormal hypermetabolic activity within the liver, pancreas, adrenal glands, or spleen.  No hypermetabolic lymph nodes in the abdomen or pelvis.  Skeleton:  No focal hypermetabolic activity to suggest skeletal metastasis.  IMPRESSION:  1.  Hypermetabolic breast tissue on the left consistent with primary carcinoma. 2.  Hypermetabolic nodal metastasis in the left axilla extending to the sub pectoralis nodes. 3.  Single hypermetabolic left internal mammary lymph node.  4.  No evidence of pulmonary metastasis.  5.  No evidence of metastasis beneath hemidiaphragms or within the skeleton.   Original Report Authenticated By: Genevive Bi, M.D.    Dg Chest Port 1 View  10/28/2012  *RADIOLOGY REPORT*  Clinical Data:  Port-A-Cath placement.  PORTABLE CHEST - 1 VIEW  Comparison: None.  Findings: The patient is rotated on the study.  Right side Port-A- Cath is in place.  Tubing appears intact.  Tip of the catheter projects in the mid to lower superior vena cava.  No pneumothorax identified.  Heart size is normal.  Lungs are clear.  The patient is status post right mastectomy and axillary dissection.  IMPRESSION: Tip of Port-A-Cath projects over the mid to lower superior vena cava.  The patient is rotated on the study.  Exact position of the tip of the catheter could be determined with the lateral film. No pneumothorax.   Original Report Authenticated By: Holley Dexter, M.D.    Dg Fluoro Guide Cv Line-no Report  10/28/2012  CLINICAL DATA: [facility]   FLOURO GUIDE CV LINE  Fluoroscopy was utilized by the requesting physician.  No radiographic  interpretation.      ASSESSMENT: 57 year old female with   #1 history of breast cancer of the right breast status post mastectomy when she was 57 years old. Due to this she was referred for genetic testing.   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 Patient's tumor is ER negative PR negative HER-2/neu negative with a Ki-67 of 21%. Patient is being seen in medical oncology for neoadjuvant chemotherapy. Patient and I discussed her pathology in detail. We discussed treatment for breast cancer including surgical and with chemotherapy. Patient understands that  she may still need a mastectomy however by giving her neoadjuvant chemotherapy her surgery may be made a little bit easier hopefully with giving her negative margins. She understands that neoadjuvant chemotherapy will also treat her distant disease.I discussed type of chemotherapy that she should have. This would include dose dense FEC x6 cycles followed by weekly Taxol carboplatinum for 12 weeks.   #3 Patient underwent  staging studies with PET/CT, and received echo, port placement and chemotherapy class.     PLAN:   #1 patient will proceed with cycle 1 day 1 of FEC. She will return tomorrow for Neulasta.  #2 she will return in one week's time for followup.   All questions were answered. The patient knows to call the clinic with any problems, questions or concerns. We can certainly see the patient much sooner if necessary.  I spent 25 minutes counseling the patient face to face. The total time spent in the appointment was 30 minutes.    Drue Second, MD Medical/Oncology [facility] [phone] (beeper) [phone] (Office)  11/01/2012, 11:25 AM

## 2012-11-01 NOTE — Telephone Encounter (Signed)
gv pt appt for echo today (11/01/12) @ 12pm @[facility] .

## 2012-11-02 ENCOUNTER — Ambulatory Visit (HOSPITAL_BASED_OUTPATIENT_CLINIC_OR_DEPARTMENT_OTHER): Payer: BC Managed Care – PPO

## 2012-11-02 ENCOUNTER — Ambulatory Visit: Payer: BC Managed Care – PPO | Admitting: Oncology

## 2012-11-02 ENCOUNTER — Other Ambulatory Visit: Payer: BC Managed Care – PPO | Admitting: Lab

## 2012-11-02 ENCOUNTER — Encounter: Payer: Self-pay | Admitting: Genetic Counselor

## 2012-11-02 VITALS — BP 141/85 | HR 86 | Temp 97.8°F | Resp 17

## 2012-11-02 DIAGNOSIS — C50919 Malignant neoplasm of unspecified site of unspecified female breast: Secondary | ICD-10-CM

## 2012-11-02 DIAGNOSIS — C50912 Malignant neoplasm of unspecified site of left female breast: Secondary | ICD-10-CM

## 2012-11-02 DIAGNOSIS — Z5111 Encounter for antineoplastic chemotherapy: Secondary | ICD-10-CM

## 2012-11-02 DIAGNOSIS — C773 Secondary and unspecified malignant neoplasm of axilla and upper limb lymph nodes: Secondary | ICD-10-CM

## 2012-11-02 MED ORDER — SODIUM CHLORIDE 0.9 % IJ SOLN
10.0000 mL | INTRAMUSCULAR | Status: DC | PRN
  Administered 2012-11-02: 10 mL
  Filled 2012-11-02: qty 10

## 2012-11-02 MED ORDER — PALONOSETRON HCL INJECTION 0.25 MG/5ML
0.2500 mg | Freq: Once | INTRAVENOUS | Status: AC
  Administered 2012-11-02: 0.25 mg via INTRAVENOUS

## 2012-11-02 MED ORDER — EPIRUBICIN HCL CHEMO IV INJECTION 200 MG/100ML
100.0000 mg/m2 | Freq: Once | INTRAVENOUS | Status: AC
  Administered 2012-11-02: 214 mg via INTRAVENOUS
  Filled 2012-11-02: qty 107

## 2012-11-02 MED ORDER — SODIUM CHLORIDE 0.9 % IV SOLN
150.0000 mg | Freq: Once | INTRAVENOUS | Status: AC
  Administered 2012-11-02: 150 mg via INTRAVENOUS
  Filled 2012-11-02: qty 5

## 2012-11-02 MED ORDER — SODIUM CHLORIDE 0.9 % IV SOLN
Freq: Once | INTRAVENOUS | Status: AC
  Administered 2012-11-02: 15:00:00 via INTRAVENOUS

## 2012-11-02 MED ORDER — SODIUM CHLORIDE 0.9 % IV SOLN
500.0000 mg/m2 | Freq: Once | INTRAVENOUS | Status: AC
  Administered 2012-11-02: 1080 mg via INTRAVENOUS
  Filled 2012-11-02: qty 54

## 2012-11-02 MED ORDER — HEPARIN SOD (PORK) LOCK FLUSH 100 UNIT/ML IV SOLN
500.0000 [IU] | Freq: Once | INTRAVENOUS | Status: AC | PRN
  Administered 2012-11-02: 500 [IU]
  Filled 2012-11-02: qty 5

## 2012-11-02 MED ORDER — DEXAMETHASONE SODIUM PHOSPHATE 4 MG/ML IJ SOLN
12.0000 mg | Freq: Once | INTRAMUSCULAR | Status: AC
  Administered 2012-11-02: 15:00:00 via INTRAVENOUS

## 2012-11-02 MED ORDER — FLUOROURACIL CHEMO INJECTION 2.5 GM/50ML
500.0000 mg/m2 | Freq: Once | INTRAVENOUS | Status: AC
  Administered 2012-11-02: 1050 mg via INTRAVENOUS
  Filled 2012-11-02: qty 21

## 2012-11-02 NOTE — Patient Instructions (Addendum)
[facility] Discharge Instructions for Patients Receiving Chemotherapy  Today you received the following chemotherapy agents epirubicin, cytoxan, 33fu  To help prevent nausea and vomiting after your treatment, we encourage you to take your nausea medication  and take it as often as prescribed   If you develop nausea and vomiting that is not controlled by your nausea medication, call the clinic. If it is after clinic hours your family physician or the after hours number for the clinic or go to the Emergency Department.   BELOW ARE SYMPTOMS THAT SHOULD BE REPORTED IMMEDIATELY:  *FEVER GREATER THAN 100.5 F  *CHILLS WITH OR WITHOUT FEVER  NAUSEA AND VOMITING THAT IS NOT CONTROLLED WITH YOUR NAUSEA MEDICATION  *UNUSUAL SHORTNESS OF BREATH  *UNUSUAL BRUISING OR BLEEDING  TENDERNESS IN MOUTH AND THROAT WITH OR WITHOUT PRESENCE OF ULCERS  *URINARY PROBLEMS  *BOWEL PROBLEMS  UNUSUAL RASH Items with * indicate a potential emergency and should be followed up as soon as possible.  One of the nurses will contact you 24 hours after your treatment. Please let the nurse know about any problems that you may have experienced. Feel free to call the clinic you have any questions or concerns. The clinic phone number is [phone].   I have been informed and understand all the instructions given to me. I know to contact the clinic, my physician, or go to the Emergency Department if any problems should occur. I do not have any questions at this time, but understand that I may call the clinic during office hours   should I have any questions or need assistance in obtaining follow up care.    __________________________________________  _____________  __________ Signature of Patient or Authorized Representative            Date                   Time    __________________________________________ Nurse's Signature

## 2012-11-02 NOTE — Progress Notes (Signed)
Dr.  Drue Second requested a consultation for genetic counseling and risk assessment for [name], a 57 y.o. female, for discussion of her personal history of bilateral breast cancer and family history of breast and colon cancer. She presents to clinic today to discuss the possibility of a genetic predisposition to cancer, and to further clarify her risks, as well as her family members' risks for cancer.   HISTORY OF PRESENT ILLNESS: In 1988, at the age of 73, Christine [name] was diagnosed with breast cancer in her right breast.  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    Past Medical History  Diagnosis Date  . ALLERGIC RHINITIS   . GERD (gastroesophageal reflux disease)   . Hypertension   . Hypothyroidism   . Hx of colonic polyps   . Wears glasses   . Breast cancer 72, age 12    right  . Breast cancer 09/30/12    left, ER/PR -, Her 2 -    Past Surgical History  Procedure Laterality Date  . Tonsillectomy    . Caesarean section      x 1  . Mastectomy  04/1987    right side  . Abdominal hysterectomy  06/1988    fibroids  . Portacath placement Right 10/28/2012    Procedure: PORT PLACEMENT;  Surgeon: Clovis Pu. Cornett, MD;  Location: [facility];  Service: General;  Laterality: Right;  Right Subclavian Vein    History  Substance Use Topics  . Smoking status: Never Smoker   . Smokeless tobacco: Never Used  . Alcohol Use: Yes     Comment: social    REPRODUCTIVE HISTORY AND PERSONAL RISK ASSESSMENT FACTORS: Menarche was at age 11.   Menopause in her 70s Uterus Intact: No, removed b/c of fibroids at 31 Ovaries Intact: Yes [id number] , first live birth at age 72  She has not previously undergone treatment for infertility.   OCP use for 9 years   She has not used HRT in the past.    FAMILY HISTORY:  We obtained a detailed, 4-generation family history.  Significant diagnoses are listed below: Family  History  Problem Relation Age of Onset  . Colon cancer Mother 86    family hx of colon ca 1st degree relative <60  . Coronary artery disease Father     family hx of CAD female 1st degree relative <50  . Stomach cancer Neg Hx   . Rectal cancer Neg Hx   . Esophageal cancer Neg Hx   . Breast cancer Maternal Grandmother 80  . Diabetes Paternal Grandmother   . Breast cancer Cousin 20    maternal cousin  . Coronary artery disease Maternal Grandfather   . Breast cancer Sister     paternal half sister; died in her 5s  The paitent was diagnosed with breast cancer at ages 68 and 110.  Her paternal half sister died of breast cancer in her 12s.  The patient's mother was diagnosed with colon cancer at age 83.  Reportedly her mother had a hysterectomy where the ovarias were removed between teh ages of 68-40.  The patient's maternal cousin was diagnosed with breast cancer at age 92, and her maternal grandmother was diagnosed with breast cancer at age 35.  Patient's maternal ancestors are of African American descent, and paternal ancestors are of African American descent. There is no reported Ashkenazi Jewish ancestry. There is no  known consanguinity.  GENETIC COUNSELING RISK ASSESSMENT, DISCUSSION, AND SUGGESTED FOLLOW UP: We reviewed the natural history and genetic etiology of sporadic, familial and hereditary cancer syndromes.  About 5-10% of breast cancer is hereditary.  Of this, about 85% is the result of a BRCA1 or BRCA2 mutation.  We reviewed the red flags of hereditary cancer syndromes and the dominant inheritance patterns.  If the BRCA testing is negative, we discussed that we could be testing for the wrong gene.  We discussed gene panels, and that several cancer genes that are associated with different cancers can be tested at the same time.  Because of the different types of cancer that are in the patient's family, we will consider one of the panel tests if she is negative for BRCA mutations.   The  patient's personal history of breast cancer and family history of colon and breast cancer is suggestive of the following possible diagnosis: hereditary cancer syndrome  We discussed that identification of a hereditary cancer syndrome may help her care providers tailor the patients medical management. If a mutation indicating a hereditary cancer syndrome is detected in this case, the [employer] recommendations would include increased cancer surveillance and possible prophylactic surgery. If a mutation is detected, the patient will be referred back to the referring provider and to any additional appropriate care providers to discuss the relevant options.   If a mutation is not found in the patient, this will decrease the likelihood of a hereditary cancer syndrome as the explanation for her breast cancer. Cancer surveillance options would be discussed for the patient according to the appropriate standard National Comprehensive Cancer Network and American Cancer Society guidelines, with consideration of their personal and family history risk factors. In this case, the patient will be referred back to their care providers for discussions of management.   After considering the risks, benefits, and limitations, the patient provided informed consent for  the following  testing: BRCA and Myrisk through [facility].   Per the patient's request, we will contact her by telephone to discuss these results. A follow up genetic counseling visit will be scheduled if indicated.  The patient was seen for a total of 60 minutes, greater than 50% of which was spent face-to-face counseling.  This plan is being carried out per Dr. Feliz Beam recommendations.  This note will also be sent to the referring provider via the electronic medical record. The patient will be supplied with a summary of this genetic counseling discussion as well as educational information on the discussed hereditary cancer  syndromes following the conclusion of their visit.   Patient was discussed with Dr. Drue Second.    _______________________________________________________________________ For Office Staff:  Number of people involved in session: 1 Was an Intern/ student involved with case: no

## 2012-11-03 ENCOUNTER — Telehealth: Payer: Self-pay | Admitting: *Deleted

## 2012-11-03 ENCOUNTER — Ambulatory Visit (HOSPITAL_BASED_OUTPATIENT_CLINIC_OR_DEPARTMENT_OTHER): Payer: BC Managed Care – PPO

## 2012-11-03 VITALS — BP 126/56 | HR 96 | Temp 98.2°F

## 2012-11-03 DIAGNOSIS — C50912 Malignant neoplasm of unspecified site of left female breast: Secondary | ICD-10-CM

## 2012-11-03 DIAGNOSIS — Z5189 Encounter for other specified aftercare: Secondary | ICD-10-CM

## 2012-11-03 DIAGNOSIS — C50919 Malignant neoplasm of unspecified site of unspecified female breast: Secondary | ICD-10-CM

## 2012-11-03 DIAGNOSIS — C773 Secondary and unspecified malignant neoplasm of axilla and upper limb lymph nodes: Secondary | ICD-10-CM

## 2012-11-03 MED ORDER — PEGFILGRASTIM INJECTION 6 MG/0.6ML
6.0000 mg | Freq: Once | SUBCUTANEOUS | Status: AC
  Administered 2012-11-03: 6 mg via SUBCUTANEOUS
  Filled 2012-11-03: qty 0.6

## 2012-11-03 NOTE — Telephone Encounter (Signed)
Christine [name] here for Neulasta injection following 1st fec chemo treatment.  States that she isn't having any nausea, vomiting, or diarrhea.  Drinking lots of fluids and eating without difficulty.  All questions answered and discussed things to expect with the chemo and injection.  Knows to call the office if she has any problems or questions.

## 2012-11-03 NOTE — Patient Instructions (Addendum)

## 2012-11-08 ENCOUNTER — Encounter: Payer: Self-pay | Admitting: Oncology

## 2012-11-08 NOTE — Progress Notes (Signed)
Called the patient back. I advised of Neulasta and I needed her signature. She will come by 11/09/12. I also advised her of EPP and she needs EOB for other help she is trying to get. I have bill for Nuelasta. Printed out a copy.

## 2012-11-09 ENCOUNTER — Telehealth: Payer: Self-pay | Admitting: Oncology

## 2012-11-09 ENCOUNTER — Ambulatory Visit (HOSPITAL_COMMUNITY)
Admission: RE | Admit: 2012-11-09 | Discharge: 2012-11-09 | Disposition: A | Discharge status: Home / Self Care | Payer: BC Managed Care – PPO | Source: Ambulatory Visit | Attending: Oncology | Admitting: Oncology

## 2012-11-09 ENCOUNTER — Other Ambulatory Visit (HOSPITAL_BASED_OUTPATIENT_CLINIC_OR_DEPARTMENT_OTHER): Payer: BC Managed Care – PPO | Admitting: Lab

## 2012-11-09 ENCOUNTER — Encounter: Payer: Self-pay | Admitting: Adult Health

## 2012-11-09 ENCOUNTER — Ambulatory Visit (HOSPITAL_BASED_OUTPATIENT_CLINIC_OR_DEPARTMENT_OTHER): Payer: BC Managed Care – PPO | Admitting: Adult Health

## 2012-11-09 VITALS — BP 115/77 | HR 83 | Temp 98.0°F | Resp 20 | Ht 64.0 in | Wt 213.6 lb

## 2012-11-09 DIAGNOSIS — M7989 Other specified soft tissue disorders: Secondary | ICD-10-CM

## 2012-11-09 DIAGNOSIS — C50919 Malignant neoplasm of unspecified site of unspecified female breast: Secondary | ICD-10-CM

## 2012-11-09 DIAGNOSIS — D709 Neutropenia, unspecified: Secondary | ICD-10-CM

## 2012-11-09 DIAGNOSIS — E876 Hypokalemia: Secondary | ICD-10-CM

## 2012-11-09 DIAGNOSIS — C50912 Malignant neoplasm of unspecified site of left female breast: Secondary | ICD-10-CM

## 2012-11-09 LAB — CBC WITH DIFFERENTIAL/PLATELET
Basophils Absolute: 0 10*3/uL (ref 0.0–0.1)
Eosinophils Absolute: 0 10*3/uL (ref 0.0–0.5)
HCT: 38.8 % (ref 34.8–46.6)
HGB: 13 g/dL (ref 11.6–15.9)
MCH: 30.5 pg (ref 25.1–34.0)
MONO#: 0 10*3/uL — ABNORMAL LOW (ref 0.1–0.9)
NEUT%: 14.5 % — ABNORMAL LOW (ref 38.4–76.8)
lymph#: 1 10*3/uL (ref 0.9–3.3)

## 2012-11-09 LAB — COMPREHENSIVE METABOLIC PANEL (CC13)
BUN: 18.6 mg/dL (ref 7.0–26.0)
CO2: 27 mEq/L (ref 22–29)
Calcium: 9.1 mg/dL (ref 8.4–10.4)
Chloride: 101 mEq/L (ref 98–107)
Creatinine: 0.9 mg/dL (ref 0.6–1.1)
Glucose: 151 mg/dl — ABNORMAL HIGH (ref 70–99)

## 2012-11-09 MED ORDER — CIPROFLOXACIN HCL 500 MG PO TABS: 500.0000 mg | ORAL_TABLET | Freq: Two times a day (BID) | ORAL | Status: DC

## 2012-11-09 NOTE — Progress Notes (Signed)
OFFICE PROGRESS NOTE  CC**  Christine Mew, MD [address] 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   PRIOR THERAPY:  1. Patient has history of right breast cancer patient underwent a mastectomy when she was 57 years old. This occurred during her pregnancy. After that patient did not take any kind of further treatments. She continued to do well.   2.Recently she noted changes in her nipple had become itchy and retracted. Because of this she underwent mammogram with ultrasound that showed 2 areas in the left breast one at 12:00 position and a second at 3:00 position. She also had an ultrasound performed that showed the diameter to be at least 6 cm and multiple left axillary lymph nodes that were abnormal. She then went on to have a biopsy performed on 09/30/2012. 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 Patient went on to have MRIs of the breasts performed and was noted to be marked diffuse enhancement involving majority of the left breast measuring 13.0 x 12.5 x 9.5 cm. There was diffuse skin thickening and dermal enhancement. Multiple enlarged level I left axillary lymph nodes. The largest lymph node measured 2.4 cm in size. The large area of enhancement within the left breast extends posteriorly to abut the anterior portion of the pectoralis muscle. There was no evidence of internal mammary adenopathy or right axillary adenopathy.  PET/CT did show the left breast mass, and left axillary and subpectoralis nodal metastases, but no further areas throughout the chest/abd/pelvis of metastases.   3.  Patient currently undergoing  neoadjuvant chemotherapy with FEC x 6 cycles starting on 11/02/12, which will be followed by 12 cycles of weekly Taxol/Carbo.  CURRENT THERAPY:FEC cycle 1 day 8   INTERVAL HISTORY: Christine [name] 57 y.o. female returns for follow up after her chemotherapy.  She tolerated her chemotherapy moderately well.  She ate a bland diet with small meals, and took her anti-nausea medication, and drank about 1/2 gallon of water per day.  She denies nausea/vomiting.  She also didn't have any difficulty with pain after receiving the Neulasta injection.  She did notice left arm swelling.  It is not painful, tender, or erythematous.  She denies fevers, chills, constipation, diarrhea, numbness or any other concerns.    MEDICAL HISTORY: Past Medical History  Diagnosis Date  . ALLERGIC RHINITIS   . GERD (gastroesophageal reflux disease)   . Hypertension   . Hypothyroidism   . Hx of colonic polyps   . Wears glasses   . Breast cancer 54, age 73    right  . Breast cancer 09/30/12    left, ER/PR -, Her 2 -    ALLERGIES:  is allergic to sulfa antibiotics and sulfonamide derivatives.  MEDICATIONS:  Current Outpatient Prescriptions  Medication Sig Dispense Refill  . aspirin 81 MG tablet Take 81 mg by mouth daily.      Marland Kitchen dexamethasone (DECADRON) 4 MG tablet Take 2 tablets by mouth once a day on the day after chemotherapy and then take 2 tablets two times a day for 2 days. Take with food.  30 tablet  1  . diltiazem (CARDIZEM CD) 240 MG 24 hr capsule  Take 1 capsule (240 mg total) by mouth daily.  90 capsule  2  . hydrochlorothiazide (MICROZIDE) 12.5 MG capsule TAKE 1 CAPSULE (12.5 MG TOTAL) BY MOUTH EVERY MORNING.  90 capsule  2  . latanoprost (XALATAN) 0.005 % ophthalmic solution Place 1 drop into both eyes at bedtime.       Marland Kitchen levothyroxine (SYNTHROID, LEVOTHROID) 50 MCG tablet TAKE 1 TABLET (50 MCG TOTAL) BY MOUTH DAILY.  90 tablet  2  . lidocaine-prilocaine (EMLA) cream Apply topically as needed.  30 g  6  .  LORazepam (ATIVAN) 0.5 MG tablet Take 1 tablet (0.5 mg total) by mouth every 6 (six) hours as needed (Nausea or vomiting).  30 tablet  0  . Multiple Vitamin (MULTIVITAMIN) tablet Take 1 tablet by mouth daily.      Marland Kitchen NEXIUM 40 MG capsule TAKE 1 CAPSULE (40 MG TOTAL) BY MOUTH DAILY BEFORE BREAKFAST.  90 capsule  1  . ondansetron (ZOFRAN) 8 MG tablet Take 1 tablet (8 mg total) by mouth 2 (two) times daily as needed. Take two times a day as needed for nausea or vomiting starting on the third day after chemotherapy.  30 tablet  1  . oxyCODONE-acetaminophen (ROXICET) 5-325 MG per tablet Take 1 tablet by mouth every 4 (four) hours as needed for pain.  30 tablet  0  . PATADAY 0.2 % SOLN       . potassium chloride SA (K-DUR,KLOR-CON) 20 MEQ tablet Take 1 tablet (20 mEq total) by mouth 2 (two) times daily. For 7 days  14 tablet  0  . prochlorperazine (COMPAZINE) 10 MG tablet Take 1 tablet (10 mg total) by mouth every 6 (six) hours as needed (Nausea or vomiting).  30 tablet  1  . prochlorperazine (COMPAZINE) 25 MG suppository Place 1 suppository (25 mg total) rectally every 12 (twelve) hours as needed for nausea.  12 suppository  3  . ciprofloxacin (CIPRO) 500 MG tablet Take 1 tablet (500 mg total) by mouth 2 (two) times daily.  14 tablet  6   No current facility-administered medications for this visit.    SURGICAL HISTORY:  Past Surgical History  Procedure Laterality Date  . Tonsillectomy    . Caesarean section      x 1  . Mastectomy  04/1987    right side  . Abdominal hysterectomy  06/1988    fibroids  . Portacath placement Right 10/28/2012    Procedure: PORT PLACEMENT;  Surgeon: Clovis Pu. Cornett, MD;  Location: [facility];  Service: General;  Laterality: Right;  Right Subclavian Vein    REVIEW OF SYSTEMS:  General: fatigue (-), night sweats (-), fever (-), pain (-) Lymph: palpable nodes (-) HEENT: vision changes (-), mucositis (-), gum bleeding (-), epistaxis (-) Cardiovascular:  chest pain (-), palpitations (-) Pulmonary: shortness of breath (-), dyspnea on exertion (-), cough (-), hemoptysis (-) GI:  Early satiety (-), melena (-), dysphagia (-), nausea/vomiting (-), diarrhea (-) GU: dysuria (-), hematuria (-), incontinence (-) Musculoskeletal: joint swelling (-), joint pain (-), back pain (-) Neuro: weakness (-), numbness (-), headache (-), confusion (-) Skin: Rash (-), lesions (-), dryness (-) Psych: depression (-), suicidal/homicidal ideation (-), feeling of hopelessness (-)   PHYSICAL EXAMINATION: Blood pressure 115/77, pulse 83, temperature 98 F (36.7 C), temperature source Oral, resp. rate 20, height 5\' 4"  (1.626 m), weight 213 lb 9.6 oz (96.888 kg). Body mass index is 36.65 kg/(m^2). General: Patient is a well appearing female in no acute distress  HEENT: PERRLA, sclerae anicteric no conjunctival pallor, MMM Neck: supple, no palpable adenopathy Lungs: clear to auscultation bilaterally, no wheezes, rhonchi, or rales Cardiovascular: regular rate rhythm, S1, S2, no murmurs, rubs or gallops Abdomen: Soft, non-tender, non-distended, normoactive bowel sounds, no HSM Extremities: warm and well perfused, no clubbing, cyanosis, or edema Skin: No rashes or lesions Neuro: Non-focal Breasts: Right mastectomy site without nodularity or sign of recurrence.  Left breast mass palpable along with dimpling of skin and inversion of the nipple.   ECOG PERFORMANCE STATUS: 1 - Symptomatic but completely ambulatory  LABORATORY DATA: Lab Results  Component Value Date   WBC 1.2* 11/09/2012   HGB 13.0 11/09/2012   HCT 38.8 11/09/2012   MCV 91.1 11/09/2012   PLT 58* 11/09/2012      Chemistry      Component Value Date/Time   NA 137 11/09/2012 0949   NA 144 10/28/2012 1248   K 3.4* 11/09/2012 0949   K 3.9 10/28/2012 1248   CL 101 11/09/2012 0949   CL 108 10/28/2012 1248   CO2 27 11/09/2012 0949   CO2 29 09/13/2011 1910   BUN 18.6 11/09/2012 0949   BUN 10 10/28/2012 1248   CREATININE 0.9  11/09/2012 0949   CREATININE 0.80 10/28/2012 1248      Component Value Date/Time   CALCIUM 9.1 11/09/2012 0949   CALCIUM 9.4 09/13/2011 1910   ALKPHOS 92 11/09/2012 0949   ALKPHOS 78 09/13/2011 1910   AST 10 11/09/2012 0949   AST 18 09/13/2011 1910   ALT 10 11/09/2012 0949   ALT 12 09/13/2011 1910   BILITOT 0.74 11/09/2012 0949   BILITOT 0.3 09/13/2011 1910       RADIOGRAPHIC STUDIES:  Ct Chest W Contrast  10/27/2012  *RADIOLOGY REPORT*  Clinical Data:  High risk breast cancer staging.  Right breast cancer diagnosed 1988.  Left breast cancer diagnosed February 2014.  CT CHEST, ABDOMEN AND PELVIS WITH CONTRAST  Technique:  Multidetector CT imaging of the chest, abdomen and pelvis was performed following the standard protocol during bolus administration of intravenous contrast.  Contrast: OMNIPAQUE IOHEXOL 300 MG/ML  SOLN  Comparison:  PET CT scan 03/26 1014  CT CHEST  Findings:  Right breast mastectomy anatomy.  There is thickening of the skin of the left breast to 11 mm.  There is a mass within the deep posterior left breast measuring 29 x 30 mm (image 6).  There are enlarged round abnormal lymph nodes in the left axilla measuring up to 20 mm short axis (image 22).  Lymph nodes extend beneath the sub pectoralis muscles on the left (image 12).  There is no evidence of internal mammary lymphadenopathy.  No infraclavicular adenopathy.  No mediastinal adenopathy.  No pericardial fluid.  No suspicious pulmonary nodules.  IMPRESSION:  1.  Left breast mass consistent with breast carcinoma. 2.  Skin thickening over the left breast is concerning for inflammatory breast cancer. 3.  Nodal metastasis in the left axilla and sub pectoralis nodal stations. 4.  No evidence of central nodal metastasis or mediastinal metastasis  CT ABDOMEN AND PELVIS  Findings:  No focal hepatic lesion.  The gallbladder, pancreas, spleen, adrenal gland is, and kidneys are normal.  The stomach, small bowel, appendix, cecum are normal.  The colon  and rectosigmoid colon are normal.  Abdominal aorta is normal caliber.  No retroperitoneal or periportal lymphadenopathy.  No mesenteric or peritoneal disease.  No free fluid in  the pelvis.  Post hysterectomy anatomy.  No pelvic lymphadenopathy. Review of  bone windows demonstrates no aggressive osseous lesions.  IMPRESSION:  No evidence of metastasis in the abdomen or pelvis.   Original Report Authenticated By: Genevive Bi, M.D.    Ct Abdomen Pelvis W Contrast  10/27/2012  *RADIOLOGY REPORT*  Clinical Data:  High risk breast cancer staging.  Right breast cancer diagnosed 1988.  Left breast cancer diagnosed February 2014.  CT CHEST, ABDOMEN AND PELVIS WITH CONTRAST  Technique:  Multidetector CT imaging of the chest, abdomen and pelvis was performed following the standard protocol during bolus administration of intravenous contrast.  Contrast: OMNIPAQUE IOHEXOL 300 MG/ML  SOLN  Comparison:  PET CT scan 03/26 1014  CT CHEST  Findings:  Right breast mastectomy anatomy.  There is thickening of the skin of the left breast to 11 mm.  There is a mass within the deep posterior left breast measuring 29 x 30 mm (image 6).  There are enlarged round abnormal lymph nodes in the left axilla measuring up to 20 mm short axis (image 22).  Lymph nodes extend beneath the sub pectoralis muscles on the left (image 12).  There is no evidence of internal mammary lymphadenopathy.  No infraclavicular adenopathy.  No mediastinal adenopathy.  No pericardial fluid.  No suspicious pulmonary nodules.  IMPRESSION:  1.  Left breast mass consistent with breast carcinoma. 2.  Skin thickening over the left breast is concerning for inflammatory breast cancer. 3.  Nodal metastasis in the left axilla and sub pectoralis nodal stations. 4.  No evidence of central nodal metastasis or mediastinal metastasis  CT ABDOMEN AND PELVIS  Findings:  No focal hepatic lesion.  The gallbladder, pancreas, spleen, adrenal gland is, and kidneys are normal.   The stomach, small bowel, appendix, cecum are normal.  The colon and rectosigmoid colon are normal.  Abdominal aorta is normal caliber.  No retroperitoneal or periportal lymphadenopathy.  No mesenteric or peritoneal disease.  No free fluid in  the pelvis.  Post hysterectomy anatomy.  No pelvic lymphadenopathy. Review of  bone windows demonstrates no aggressive osseous lesions.  IMPRESSION:  No evidence of metastasis in the abdomen or pelvis.   Original Report Authenticated By: Genevive Bi, M.D.    Mr Breast Bilateral W Wo Contrast  10/12/2012  *RADIOLOGY REPORT*  Clinical Data: History of previous malignant mastectomy of the right breast 25 years ago.  The patient developed diffuse skin thickening and firmness within the left breast.  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  BUN and creatinine were obtained on site at [facility] at [address] Results:  BUN 7 mg/dL,  Creatinine 1.0 mg/dL.  BILATERAL BREAST MRI WITH AND WITHOUT CONTRAST  Technique: Multiplanar, multisequence MR images of both breasts were obtained prior to and following the intravenous administration of 20ml of Multihance.  Three dimensional images were evaluated at the independent DynaCad workstation.  Comparison:  Mammograms dated 09/30/2012 and 09/28/2012.  Findings: There has been a previous right mastectomy.  There is marked diffuse enhancement involving the majority of the left breast.  This measures 13.0 x 12.5 x 9.5 cm in size.  This is associated with a mixture of plateau and washout enhancement kinetics.  In addition,  there is diffuse skin thickening and dermal enhancement.  There are multiple enlarged level I left axillary lymph nodes.  The largest lymph node measures 2.4 cm in size.  The  large area of enhancement within the left breast extends posteriorly to abut the anterior portion of  the pectoral muscle. There is no evidence for internal mammary adenopathy or right axillary adenopathy.  There are no additional findings.  IMPRESSION:  1.  Large ( 13 cm) area of enhancement involving the majority of the left breast consistent with the patient's known invasive ductal carcinoma and DCIS. This extends posteriorly to abut the pectoralis muscle. 2.  Multiple enlarged level I left axillary lymph nodes. 3.  Previous right mastectomy.  RECOMMENDATION: Treatment plan  THREE-DIMENSIONAL MR IMAGE RENDERING ON INDEPENDENT WORKSTATION:  Three-dimensional MR images were rendered by post-processing of the original MR data on an independent workstation.  The three- dimensional MR images were interpreted, and findings were reported in the accompanying complete MRI report for this study.  BI-RADS CATEGORY 6:  Known biopsy-proven malignancy - appropriate action should be taken.   Original Report Authenticated By: Rolla Plate, M.D.    Nm Pet Image Initial (pi) Skull Base To Thigh  10/27/2012  *RADIOLOGY REPORT*  Clinical Data: Initial treatment strategy for high risk of breast cancer. Triple negative  NUCLEAR MEDICINE PET SKULL BASE TO THIGH  Fasting Blood Glucose:  97  Technique:  17.9 mCi F-18 FDG was injected intravenously. CT data was obtained and used for attenuation correction and anatomic localization only.  (This was not acquired as a diagnostic CT examination.) Additional exam technical data entered on technologist worksheet.  Comparison:  Findings:  Neck: No hypermetabolic lymph nodes in the neck.  Chest:  There is intense metabolic activity ( SUV max = 29.5) associated with a large portion of the glandular tissue of the left breast consistent with carcinoma.  There are intensely hypermetabolic left axillary lymph nodes with SUV max = 15.1.  These nodes are rounded and measure up to 2 cm. Hypermetabolic nodes extend to the sub pectoralis nodal station (image 71).  There is a single hypermetabolic  internal mammary lymph node measuring 5 mm just left of the sternum (image 87).  This has intense metabolic activity for size with SUV max = 7.1.  No hypermetabolic mediastinal lymph nodes.  No hypermetabolic pulmonary nodules.  Abdomen/Pelvis:  No abnormal hypermetabolic activity within the liver, pancreas, adrenal glands, or spleen.  No hypermetabolic lymph nodes in the abdomen or pelvis.  Skeleton:  No focal hypermetabolic activity to suggest skeletal metastasis.  IMPRESSION:  1.  Hypermetabolic breast tissue on the left consistent with primary carcinoma. 2.  Hypermetabolic nodal metastasis in the left axilla extending to the sub pectoralis nodes. 3.  Single hypermetabolic left internal mammary lymph node.  4.  No evidence of pulmonary metastasis.  5.  No evidence of metastasis beneath hemidiaphragms or within the skeleton.   Original Report Authenticated By: Genevive Bi, M.D.    Dg Chest Port 1 View  10/28/2012  *RADIOLOGY REPORT*  Clinical Data: Port-A-Cath placement.  PORTABLE CHEST - 1 VIEW  Comparison: None.  Findings: The patient is rotated on the study.  Right side Port-A- Cath is in place.  Tubing appears intact.  Tip of the catheter projects in the mid to lower superior vena cava.  No pneumothorax identified.  Heart size is normal.  Lungs are clear.  The patient is status post right mastectomy and axillary dissection.  IMPRESSION: Tip of Port-A-Cath projects over the mid to lower superior vena cava.  The patient is rotated on the study.  Exact position of the tip of the catheter could be determined with the lateral  film. No pneumothorax.   Original Report Authenticated By: Holley Dexter, M.D.    Dg Fluoro Guide Cv Line-no Report  10/28/2012  CLINICAL DATA: [facility]   FLOURO GUIDE CV LINE  Fluoroscopy was utilized by the requesting physician.  No radiographic  interpretation.      ASSESSMENT:   57 year old female with  #1 history of breast cancer of the right breast status post  mastectomy when she was 57 years old. Due to this she was referred for genetic testing.   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 Patient's tumor is ER negative PR negative HER-2/neu negative with a Ki-67 of 21%. Patient is being seen in medical oncology for neoadjuvant chemotherapy. Patient and I discussed her pathology in detail. We discussed treatment for breast cancer including surgical and with chemotherapy. Patient understands that she may still need a mastectomy however by giving her neoadjuvant chemotherapy her surgery may be made a little bit easier hopefully with giving her negative margins. She understands that neoadjuvant chemotherapy will also treat her distant disease.I discussed type of chemotherapy that she should have. This would include dose dense FEC x6 cycles followed by weekly Taxol carboplatinum for 12 weeks.  #3 Patient underwent staging studies with PET/CT, and received echo, port placement and chemotherapy class.    #4 Neutropenia  #5 Hypokalemia   PLAN:   1. Doing well.  Tolerated chemotherapy with minimal effects.  Patient is subsequently neutropenic.  I prescribed cipro and reviewed neutropenic precautions with her and her sister in detail.    2. Patient was given information regarding a potassium rich diet for her mild hypokalemia.    3. Ms. Mounsey will return next week for her second cycle of chemotherapy.     All questions were answered. The patient knows to call the clinic with any problems, questions or concerns. We can certainly see the patient much sooner if necessary.  I spent 25 minutes counseling the patient face to face. The total time spent in the appointment was 30 minutes.   Cherie Ouch Lyn Hollingshead, NP Medical Oncology [facility] Phone: [phone] 11/09/2012, 1:32 PM

## 2012-11-09 NOTE — Patient Instructions (Addendum)
Patient Neutropenia Instruction Sheet  Diagnosis: Breast Cancer      Treating Physician: Drue Second, MD  Treatment: 1. Type of chemotherapy: FEC 2. Date of last treatment: 11/02/12  Last Blood Counts: Lab Results  Component Value Date   WBC 1.2* 11/09/2012   HGB 13.0 11/09/2012   HCT 38.8 11/09/2012   MCV 91.1 11/09/2012   PLT 58* 11/09/2012   ANC 200     Prophylactic Antibiotics: Cipro 500 mg by mouth twice a day Instructions: 1. Monitor temperature and call if fever  greater than 100.5, chills, shaking chills (rigors) 2. Call Physician on-call at [phone] 3. Give him/her symptoms and list of medications that you are taking and your last blood count.   Hypokalemia Hypokalemia means a low potassium level in the blood.Potassium is an electrolyte that helps regulate the amount of fluid in the body. It also stimulates muscle contraction and maintains a stable acid-base balance.Most of the body's potassium is inside of cells, and only a very small amount is in the blood. Because the amount in the blood is so small, minor changes can have big effects. PREPARATION FOR TEST Testing for potassium requires taking a blood sample taken by needle from a vein in the arm. The skin is cleaned thoroughly before the sample is drawn. There is no other special preparation needed. NORMAL VALUES Potassium levels below 3.5 mEq/L are abnormally low. Levels above 5.1 mEq/L are abnormally high. Ranges for normal findings may vary among different laboratories and hospitals. You should always check with your doctor after having lab work or other tests done to discuss the meaning of your test results and whether your values are considered within normal limits. MEANING OF TEST  Your caregiver will go over the test results with you and discuss the importance and meaning of your results, as well as treatment options and the need for additional tests, if necessary. A potassium level is frequently part of a  routine medical exam. It is usually included as part of a whole "panel" of tests for several blood salts (such as Sodium and Chloride). It may be done as part of follow-up when a low potassium level was found in the past or other blood salts are suspected of being out of balance. A low potassium level might be suspected if you have one or more of the following:  Symptoms of weakness.  Abnormal heart rhythms.  High blood pressure and are taking medication to control this, especially water pills (diuretics).  Kidney disease that can affect your potassium level .  Diabetes requiring the use of insulin. The potassium may fall after taking insulin, especially if the diabetes had been out of control for a while.  A condition requiring the use of cortisone-type medication or certain types of antibiotics.  Vomiting and/or diarrhea for more than a day or two.  A stomach or intestinal condition that may not permit appropriate absorption of potassium.  Fainting episodes.  Mental confusion. OBTAINING TEST RESULTS It is your responsibility to obtain your test results. Ask the lab or department performing the test when and how you will get your results.  Please contact your caregiver directly if you have not received the results within one week. At that time, ask if there is anything different or new you should be doing in relation to the results. TREATMENT Hypokalemia can be treated with potassium supplements taken by mouth and/or adjustments in your current medications. A diet high in potassium is also helpful. Foods with high potassium  content are:  Peas, lentils, lima beans, nuts, and dried fruit.  Whole grain and bran cereals and breads.  Fresh fruit, vegetables (bananas, cantaloupe, grapefruit, oranges, tomatoes, honeydew melons, potatoes).  Orange and tomato juices.  Meats. If potassium supplement has been prescribed for you today or your medications have been adjusted, see your  personal caregiver in time02 for a re-check. SEEK MEDICAL CARE IF:  There is a feeling of worsening weakness.  You experience repeated chest palpitations.  You are diabetic and having difficulty keeping your blood sugars in the normal range.  You are experiencing vomiting and/or diarrhea.  You are having difficulty with any of your regular medications. SEEK IMMEDIATE MEDICAL CARE IF:  You experience chest pain, shortness of breath, or episodes of dizziness.  You have been having vomiting or diarrhea for more than 2 days.  You have a fainting episode. MAKE SURE YOU:   Understand these instructions.  Will watch your condition.  Will get help right away if you are not doing well or get worse. Document Released: 07/21/2005 Document Revised: 10/13/2011 Document Reviewed: 07/01/2008 [facility] Patient Information 2013 [place], [place].

## 2012-11-09 NOTE — Telephone Encounter (Signed)
Pt sent to [facility] for doppler and given appt schedule for April thru June. No new orders.

## 2012-11-09 NOTE — Progress Notes (Signed)
*  Preliminary Results* Left upper extremity venous duplex completed. Study was technically limited due to patient body habitus. Visualized veins of the left upper extremity are negative for deep and superficial vein thrombosis. Attempted to call results to [phone] with no answer. Patient is instructed to leave and if necessary can be reached by phone.  11/09/2012 12:44 PM Gertie Fey, RDMS, RDCS

## 2012-11-12 ENCOUNTER — Encounter: Payer: Self-pay | Admitting: Oncology

## 2012-11-12 NOTE — Progress Notes (Signed)
Patient left a message and I called her back to advise I have the form for her to sign when she comes in on Tuesday and the copy of bills she requested. She will see Lenise .

## 2012-11-15 ENCOUNTER — Telehealth: Payer: Self-pay | Admitting: Genetic Counselor

## 2012-11-15 NOTE — Telephone Encounter (Signed)
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

## 2012-11-16 ENCOUNTER — Telehealth (INDEPENDENT_AMBULATORY_CARE_PROVIDER_SITE_OTHER): Payer: Self-pay

## 2012-11-16 ENCOUNTER — Encounter: Payer: Self-pay | Admitting: Adult Health

## 2012-11-16 ENCOUNTER — Other Ambulatory Visit (HOSPITAL_BASED_OUTPATIENT_CLINIC_OR_DEPARTMENT_OTHER): Payer: BC Managed Care – PPO | Admitting: Lab

## 2012-11-16 ENCOUNTER — Ambulatory Visit (HOSPITAL_BASED_OUTPATIENT_CLINIC_OR_DEPARTMENT_OTHER): Payer: BC Managed Care – PPO | Admitting: Adult Health

## 2012-11-16 ENCOUNTER — Ambulatory Visit (HOSPITAL_BASED_OUTPATIENT_CLINIC_OR_DEPARTMENT_OTHER): Payer: BC Managed Care – PPO

## 2012-11-16 VITALS — BP 127/77 | HR 99 | Temp 98.9°F | Resp 20 | Ht 64.0 in | Wt 214.0 lb

## 2012-11-16 DIAGNOSIS — K1230 Oral mucositis (ulcerative), unspecified: Secondary | ICD-10-CM

## 2012-11-16 DIAGNOSIS — C50912 Malignant neoplasm of unspecified site of left female breast: Secondary | ICD-10-CM

## 2012-11-16 DIAGNOSIS — C50919 Malignant neoplasm of unspecified site of unspecified female breast: Secondary | ICD-10-CM

## 2012-11-16 DIAGNOSIS — D0590 Unspecified type of carcinoma in situ of unspecified breast: Secondary | ICD-10-CM

## 2012-11-16 DIAGNOSIS — B373 Candidiasis of vulva and vagina: Secondary | ICD-10-CM

## 2012-11-16 DIAGNOSIS — Z5111 Encounter for antineoplastic chemotherapy: Secondary | ICD-10-CM

## 2012-11-16 DIAGNOSIS — C773 Secondary and unspecified malignant neoplasm of axilla and upper limb lymph nodes: Secondary | ICD-10-CM

## 2012-11-16 DIAGNOSIS — Z853 Personal history of malignant neoplasm of breast: Secondary | ICD-10-CM

## 2012-11-16 DIAGNOSIS — T80212A Local infection due to central venous catheter, initial encounter: Secondary | ICD-10-CM

## 2012-11-16 DIAGNOSIS — B379 Candidiasis, unspecified: Secondary | ICD-10-CM

## 2012-11-16 DIAGNOSIS — L089 Local infection of the skin and subcutaneous tissue, unspecified: Secondary | ICD-10-CM

## 2012-11-16 LAB — CBC WITH DIFFERENTIAL/PLATELET
BASO%: 0.3 % (ref 0.0–2.0)
Basophils Absolute: 0 10*3/uL (ref 0.0–0.1)
HCT: 34.2 % — ABNORMAL LOW (ref 34.8–46.6)
HGB: 11.3 g/dL — ABNORMAL LOW (ref 11.6–15.9)
MONO#: 0.8 10*3/uL (ref 0.1–0.9)
NEUT%: 63.3 % (ref 38.4–76.8)
RDW: 13.4 % (ref 11.2–14.5)
WBC: 9.2 10*3/uL (ref 3.9–10.3)
lymph#: 2.6 10*3/uL (ref 0.9–3.3)

## 2012-11-16 MED ORDER — MAGIC MOUTHWASH: 5.0000 mL | Freq: Four times a day (QID) | ORAL | Status: DC

## 2012-11-16 MED ORDER — EPIRUBICIN HCL CHEMO IV INJECTION 200 MG/100ML
100.0000 mg/m2 | Freq: Once | INTRAVENOUS | Status: AC
  Administered 2012-11-16: 214 mg via INTRAVENOUS
  Filled 2012-11-16: qty 107

## 2012-11-16 MED ORDER — SODIUM CHLORIDE 0.9 % IV SOLN
150.0000 mg | Freq: Once | INTRAVENOUS | Status: AC
  Administered 2012-11-16: 150 mg via INTRAVENOUS
  Filled 2012-11-16: qty 5

## 2012-11-16 MED ORDER — PALONOSETRON HCL INJECTION 0.25 MG/5ML
0.2500 mg | Freq: Once | INTRAVENOUS | Status: AC
  Administered 2012-11-16: 0.25 mg via INTRAVENOUS

## 2012-11-16 MED ORDER — DEXAMETHASONE SODIUM PHOSPHATE 4 MG/ML IJ SOLN
12.0000 mg | Freq: Once | INTRAMUSCULAR | Status: AC
  Administered 2012-11-16: 12 mg via INTRAVENOUS

## 2012-11-16 MED ORDER — FLUCONAZOLE 200 MG PO TABS: 200.0000 mg | ORAL_TABLET | ORAL | Status: DC

## 2012-11-16 MED ORDER — SODIUM CHLORIDE 0.9 % IV SOLN
Freq: Once | INTRAVENOUS | Status: AC
  Administered 2012-11-16: 16:00:00 via INTRAVENOUS

## 2012-11-16 MED ORDER — FLUOROURACIL CHEMO INJECTION 2.5 GM/50ML
500.0000 mg/m2 | Freq: Once | INTRAVENOUS | Status: AC
  Administered 2012-11-16: 1050 mg via INTRAVENOUS
  Filled 2012-11-16: qty 21

## 2012-11-16 MED ORDER — DOXYCYCLINE HYCLATE 100 MG PO TABS: 100.0000 mg | ORAL_TABLET | Freq: Two times a day (BID) | ORAL | Status: DC

## 2012-11-16 MED ORDER — SODIUM CHLORIDE 0.9 % IV SOLN
500.0000 mg/m2 | Freq: Once | INTRAVENOUS | Status: AC
  Administered 2012-11-16: 1080 mg via INTRAVENOUS
  Filled 2012-11-16: qty 54

## 2012-11-16 MED ORDER — CEPHALEXIN 500 MG PO CAPS: 500.0000 mg | ORAL_CAPSULE | Freq: Four times a day (QID) | ORAL | Status: DC

## 2012-11-16 NOTE — Addendum Note (Signed)
Addended by: Augustin Schooling C on: 11/16/2012 03:07 PM   Modules accepted: Orders

## 2012-11-16 NOTE — Telephone Encounter (Signed)
Reminded pt to go to [facility] to get Doxycycline to get started tonight. The pt agrees.

## 2012-11-16 NOTE — Telephone Encounter (Signed)
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 The pt see's Dr Welton Flakes at [facility] but she is not in the office today. The pt was told to call us to let us know what was going on with the [facility]. They state that the [facility] is draining pus and it's seperating at the skin. The infusion nurse is now trying to give chemo thru the arm for tx. I advised them that I would have to discuss this with one of Dr Cornett's partners b/c he is out of the office this week and call her back.

## 2012-11-16 NOTE — Telephone Encounter (Signed)
LMOM for pt or pt's sister to call me. I want to let them know that I did speak to Dr Derrell Lolling about the infected [facility].

## 2012-11-16 NOTE — Telephone Encounter (Signed)
Pt returned my call. I notified pt that I did speak with Dr Derrell Lolling about the infected [facility] and he wants the pt to start Doxycycline 100mg  BID x 14 days. Dr Derrell Lolling also said the pt needs to be seen in the next 24-48hrs to have the [facility] examined by a surgeon. I made an appt for the pt with Dr Johna Sheriff on 11/17/12 arrive at 3:30. The pt understands and will be here.

## 2012-11-16 NOTE — Progress Notes (Addendum)
OFFICE PROGRESS NOTE  CC**  Christine Mew, MD [address] 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   PRIOR THERAPY:  1. Patient has history of right breast cancer patient underwent a mastectomy when she was 57 years old. This occurred during her pregnancy. After that patient did not take any kind of further treatments. She continued to do well.   2.Recently she noted changes in her nipple had become itchy and retracted. Because of this she underwent mammogram with ultrasound that showed 2 areas in the left breast one at 12:00 position and a second at 3:00 position. She also had an ultrasound performed that showed the diameter to be at least 6 cm and multiple left axillary lymph nodes that were abnormal. She then went on to have a biopsy performed on 09/30/2012. 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 Patient went on to have MRIs of the breasts performed and was noted to be marked diffuse enhancement involving majority of the left breast measuring 13.0 x 12.5 x 9.5 cm. There was diffuse skin thickening and dermal enhancement. Multiple enlarged level I left axillary lymph nodes. The largest lymph node measured 2.4 cm in size. The large area of enhancement within the left breast extends posteriorly to abut the anterior portion of the pectoralis muscle. There was no evidence of internal mammary adenopathy or right axillary adenopathy.  PET/CT did show the left breast mass, and left axillary and subpectoralis nodal metastases, but no further areas throughout the chest/abd/pelvis of metastases.   3.  Patient currently undergoing  neoadjuvant chemotherapy with FEC x 6 cycles starting on 11/02/12, which will be followed by 12 cycles of weekly Taxol/Carbo.  CURRENT THERAPY:FEC cycle 2 day 1  INTERVAL HISTORY: Christine [name] 57 y.o. female returns for follow up today.  She is doing well.  She feels good.  She does have some mouth pain, otherwise, she denies fevers, chills, nausea, vomiting, constipation, diarrhea, numbness, or any further concerns.    MEDICAL HISTORY: Past Medical History  Diagnosis Date  . ALLERGIC RHINITIS   . GERD (gastroesophageal reflux disease)   . Hypertension   . Hypothyroidism   . Hx of colonic polyps   . Wears glasses   . Breast cancer 76, age 55    right  . Breast cancer 09/30/12    left, ER/PR -, Her 2 -    ALLERGIES:  is allergic to sulfa antibiotics and sulfonamide derivatives.  MEDICATIONS:  Current Outpatient Prescriptions  Medication Sig Dispense Refill  . aspirin 81 MG tablet Take 81 mg by mouth daily.      Marland Kitchen dexamethasone (DECADRON) 4 MG tablet Take 2 tablets by mouth once a day on the day after chemotherapy and then take 2 tablets two times a day for 2 days. Take with food.  30 tablet  1  . diltiazem (CARDIZEM CD) 240 MG 24 hr capsule Take 1 capsule (240 mg total) by mouth daily.  90 capsule  2  . hydrochlorothiazide (MICROZIDE) 12.5 MG capsule TAKE 1 CAPSULE (12.5 MG TOTAL) BY MOUTH EVERY MORNING.  90 capsule  2  . latanoprost (XALATAN) 0.005 % ophthalmic solution Place 1 drop into  both eyes at bedtime.       Marland Kitchen levothyroxine (SYNTHROID, LEVOTHROID) 50 MCG tablet TAKE 1 TABLET (50 MCG TOTAL) BY MOUTH DAILY.  90 tablet  2  . lidocaine-prilocaine (EMLA) cream Apply topically as needed.  30 g  6  . LORazepam (ATIVAN) 0.5 MG tablet Take 1 tablet (0.5 mg total) by mouth every 6 (six) hours as needed (Nausea or vomiting).  30 tablet  0  . NEXIUM 40 MG capsule TAKE 1 CAPSULE (40 MG TOTAL) BY MOUTH DAILY BEFORE BREAKFAST.  90 capsule  1  . ondansetron (ZOFRAN) 8 MG tablet Take 1  tablet (8 mg total) by mouth 2 (two) times daily as needed. Take two times a day as needed for nausea or vomiting starting on the third day after chemotherapy.  30 tablet  1  . oxyCODONE-acetaminophen (ROXICET) 5-325 MG per tablet Take 1 tablet by mouth every 4 (four) hours as needed for pain.  30 tablet  0  . PATADAY 0.2 % SOLN       . prochlorperazine (COMPAZINE) 10 MG tablet Take 1 tablet (10 mg total) by mouth every 6 (six) hours as needed (Nausea or vomiting).  30 tablet  1  . prochlorperazine (COMPAZINE) 25 MG suppository Place 1 suppository (25 mg total) rectally every 12 (twelve) hours as needed for nausea.  12 suppository  3   No current facility-administered medications for this visit.    SURGICAL HISTORY:  Past Surgical History  Procedure Laterality Date  . Tonsillectomy    . Caesarean section      x 1  . Mastectomy  04/1987    right side  . Abdominal hysterectomy  06/1988    fibroids  . Portacath placement Right 10/28/2012    Procedure: PORT PLACEMENT;  Surgeon: Clovis Pu. Cornett, MD;  Location: [facility];  Service: General;  Laterality: Right;  Right Subclavian Vein    REVIEW OF SYSTEMS:  General: fatigue (-), night sweats (-), fever (-), pain (-) Lymph: palpable nodes (-) HEENT: vision changes (-), mucositis (-), gum bleeding (-), epistaxis (-) Cardiovascular: chest pain (-), palpitations (-) Pulmonary: shortness of breath (-), dyspnea on exertion (-), cough (-), hemoptysis (-) GI:  Early satiety (-), melena (-), dysphagia (-), nausea/vomiting (-), diarrhea (-) GU: dysuria (-), hematuria (-), incontinence (-) Musculoskeletal: joint swelling (-), joint pain (-), back pain (-) Neuro: weakness (-), numbness (-), headache (-), confusion (-) Skin: Rash (-), lesions (-), dryness (-) Psych: depression (-), suicidal/homicidal ideation (-), feeling of hopelessness (-)   PHYSICAL EXAMINATION: Blood pressure 127/77, pulse 99, temperature 98.9 F (37.2 C),  temperature source Oral, resp. rate 20, height 5\' 4"  (1.626 m), weight 214 lb (97.07 kg). Body mass index is 36.72 kg/(m^2). General: Patient is a well appearing female in no acute distress HEENT: PERRLA, sclerae anicteric no conjunctival pallor, MMM. Small red lesions on tip of tongue and left buccal mucosa. Neck: supple, no palpable adenopathy Lungs: clear to auscultation bilaterally, no wheezes, rhonchi, or rales Cardiovascular: regular rate rhythm, S1, S2, no murmurs, rubs or gallops Abdomen: Soft, non-tender, non-distended, normoactive bowel sounds, no HSM Extremities: warm and well perfused, no clubbing, cyanosis, or edema Skin: No rashes or lesions Neuro: Non-focal Breasts: Right mastectomy site without nodularity or sign of recurrence.  Left breast mass palpable along with dimpling of skin and inversion of the nipple.   ECOG PERFORMANCE STATUS: 1 - Symptomatic but completely ambulatory  LABORATORY DATA: Lab Results  Component Value Date   WBC  9.2 11/16/2012   HGB 11.3* 11/16/2012   HCT 34.2* 11/16/2012   MCV 91.7 11/16/2012   PLT 200 11/16/2012      Chemistry      Component Value Date/Time   NA 137 11/09/2012 0949   NA 144 10/28/2012 1248   K 3.4* 11/09/2012 0949   K 3.9 10/28/2012 1248   CL 101 11/09/2012 0949   CL 108 10/28/2012 1248   CO2 27 11/09/2012 0949   CO2 29 09/13/2011 1910   BUN 18.6 11/09/2012 0949   BUN 10 10/28/2012 1248   CREATININE 0.9 11/09/2012 0949   CREATININE 0.80 10/28/2012 1248      Component Value Date/Time   CALCIUM 9.1 11/09/2012 0949   CALCIUM 9.4 09/13/2011 1910   ALKPHOS 92 11/09/2012 0949   ALKPHOS 78 09/13/2011 1910   AST 10 11/09/2012 0949   AST 18 09/13/2011 1910   ALT 10 11/09/2012 0949   ALT 12 09/13/2011 1910   BILITOT 0.74 11/09/2012 0949   BILITOT 0.3 09/13/2011 1910       RADIOGRAPHIC STUDIES:  Ct Chest W Contrast  10/27/2012  *RADIOLOGY REPORT*  Clinical Data:  High risk breast cancer staging.  Right breast cancer diagnosed 1988.  Left breast cancer  diagnosed February 2014.  CT CHEST, ABDOMEN AND PELVIS WITH CONTRAST  Technique:  Multidetector CT imaging of the chest, abdomen and pelvis was performed following the standard protocol during bolus administration of intravenous contrast.  Contrast: OMNIPAQUE IOHEXOL 300 MG/ML  SOLN  Comparison:  PET CT scan 03/26 1014  CT CHEST  Findings:  Right breast mastectomy anatomy.  There is thickening of the skin of the left breast to 11 mm.  There is a mass within the deep posterior left breast measuring 29 x 30 mm (image 6).  There are enlarged round abnormal lymph nodes in the left axilla measuring up to 20 mm short axis (image 22).  Lymph nodes extend beneath the sub pectoralis muscles on the left (image 12).  There is no evidence of internal mammary lymphadenopathy.  No infraclavicular adenopathy.  No mediastinal adenopathy.  No pericardial fluid.  No suspicious pulmonary nodules.  IMPRESSION:  1.  Left breast mass consistent with breast carcinoma. 2.  Skin thickening over the left breast is concerning for inflammatory breast cancer. 3.  Nodal metastasis in the left axilla and sub pectoralis nodal stations. 4.  No evidence of central nodal metastasis or mediastinal metastasis  CT ABDOMEN AND PELVIS  Findings:  No focal hepatic lesion.  The gallbladder, pancreas, spleen, adrenal gland is, and kidneys are normal.  The stomach, small bowel, appendix, cecum are normal.  The colon and rectosigmoid colon are normal.  Abdominal aorta is normal caliber.  No retroperitoneal or periportal lymphadenopathy.  No mesenteric or peritoneal disease.  No free fluid in  the pelvis.  Post hysterectomy anatomy.  No pelvic lymphadenopathy. Review of  bone windows demonstrates no aggressive osseous lesions.  IMPRESSION:  No evidence of metastasis in the abdomen or pelvis.   Original Report Authenticated By: Genevive Bi, M.D.    Ct Abdomen Pelvis W Contrast  10/27/2012  *RADIOLOGY REPORT*  Clinical Data:  High risk breast cancer  staging.  Right breast cancer diagnosed 1988.  Left breast cancer diagnosed February 2014.  CT CHEST, ABDOMEN AND PELVIS WITH CONTRAST  Technique:  Multidetector CT imaging of the chest, abdomen and pelvis was performed following the standard protocol during bolus administration of intravenous contrast.  Contrast: OMNIPAQUE IOHEXOL 300 MG/ML  SOLN  Comparison:  PET CT scan 03/26 1014  CT CHEST  Findings:  Right breast mastectomy anatomy.  There is thickening of the skin of the left breast to 11 mm.  There is a mass within the deep posterior left breast measuring 29 x 30 mm (image 6).  There are enlarged round abnormal lymph nodes in the left axilla measuring up to 20 mm short axis (image 22).  Lymph nodes extend beneath the sub pectoralis muscles on the left (image 12).  There is no evidence of internal mammary lymphadenopathy.  No infraclavicular adenopathy.  No mediastinal adenopathy.  No pericardial fluid.  No suspicious pulmonary nodules.  IMPRESSION:  1.  Left breast mass consistent with breast carcinoma. 2.  Skin thickening over the left breast is concerning for inflammatory breast cancer. 3.  Nodal metastasis in the left axilla and sub pectoralis nodal stations. 4.  No evidence of central nodal metastasis or mediastinal metastasis  CT ABDOMEN AND PELVIS  Findings:  No focal hepatic lesion.  The gallbladder, pancreas, spleen, adrenal gland is, and kidneys are normal.  The stomach, small bowel, appendix, cecum are normal.  The colon and rectosigmoid colon are normal.  Abdominal aorta is normal caliber.  No retroperitoneal or periportal lymphadenopathy.  No mesenteric or peritoneal disease.  No free fluid in  the pelvis.  Post hysterectomy anatomy.  No pelvic lymphadenopathy. Review of  bone windows demonstrates no aggressive osseous lesions.  IMPRESSION:  No evidence of metastasis in the abdomen or pelvis.   Original Report Authenticated By: Genevive Bi, M.D.    Mr Breast Bilateral W Wo  Contrast  10/12/2012  *RADIOLOGY REPORT*  Clinical Data: History of previous malignant mastectomy of the right breast 25 years ago.  The patient developed diffuse skin thickening and firmness within the left breast.  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  BUN and creatinine were obtained on site at [facility] at [address] Results:  BUN 7 mg/dL,  Creatinine 1.0 mg/dL.  BILATERAL BREAST MRI WITH AND WITHOUT CONTRAST  Technique: Multiplanar, multisequence MR images of both breasts were obtained prior to and following the intravenous administration of 20ml of Multihance.  Three dimensional images were evaluated at the independent DynaCad workstation.  Comparison:  Mammograms dated 09/30/2012 and 09/28/2012.  Findings: There has been a previous right mastectomy.  There is marked diffuse enhancement involving the majority of the left breast.  This measures 13.0 x 12.5 x 9.5 cm in size.  This is associated with a mixture of plateau and washout enhancement kinetics.  In addition,  there is diffuse skin thickening and dermal enhancement.  There are multiple enlarged level I left axillary lymph nodes.  The largest lymph node measures 2.4 cm in size.  The large area of enhancement within the left breast extends posteriorly to abut the anterior portion of the pectoral muscle. There is no evidence for internal mammary adenopathy or right axillary adenopathy.  There are no additional findings.  IMPRESSION:  1.  Large ( 13 cm) area of enhancement involving the majority of the left breast consistent with the patient's known invasive ductal carcinoma and DCIS. This extends posteriorly to abut the pectoralis muscle. 2.  Multiple enlarged level I left axillary lymph nodes. 3.  Previous right mastectomy.  RECOMMENDATION: Treatment plan  THREE-DIMENSIONAL MR IMAGE RENDERING ON INDEPENDENT  WORKSTATION:  Three-dimensional MR images were rendered by  post-processing of the original MR data on an independent workstation.  The three- dimensional MR images were interpreted, and findings were reported in the accompanying complete MRI report for this study.  BI-RADS CATEGORY 6:  Known biopsy-proven malignancy - appropriate action should be taken.   Original Report Authenticated By: Rolla Plate, M.D.    Nm Pet Image Initial (pi) Skull Base To Thigh  10/27/2012  *RADIOLOGY REPORT*  Clinical Data: Initial treatment strategy for high risk of breast cancer. Triple negative  NUCLEAR MEDICINE PET SKULL BASE TO THIGH  Fasting Blood Glucose:  97  Technique:  17.9 mCi F-18 FDG was injected intravenously. CT data was obtained and used for attenuation correction and anatomic localization only.  (This was not acquired as a diagnostic CT examination.) Additional exam technical data entered on technologist worksheet.  Comparison:  Findings:  Neck: No hypermetabolic lymph nodes in the neck.  Chest:  There is intense metabolic activity ( SUV max = 16.1) associated with a large portion of the glandular tissue of the left breast consistent with carcinoma.  There are intensely hypermetabolic left axillary lymph nodes with SUV max = 15.1.  These nodes are rounded and measure up to 2 cm. Hypermetabolic nodes extend to the sub pectoralis nodal station (image 71).  There is a single hypermetabolic internal mammary lymph node measuring 5 mm just left of the sternum (image 87).  This has intense metabolic activity for size with SUV max = 7.1.  No hypermetabolic mediastinal lymph nodes.  No hypermetabolic pulmonary nodules.  Abdomen/Pelvis:  No abnormal hypermetabolic activity within the liver, pancreas, adrenal glands, or spleen.  No hypermetabolic lymph nodes in the abdomen or pelvis.  Skeleton:  No focal hypermetabolic activity to suggest skeletal metastasis.  IMPRESSION:  1.  Hypermetabolic breast tissue on the left  consistent with primary carcinoma. 2.  Hypermetabolic nodal metastasis in the left axilla extending to the sub pectoralis nodes. 3.  Single hypermetabolic left internal mammary lymph node.  4.  No evidence of pulmonary metastasis.  5.  No evidence of metastasis beneath hemidiaphragms or within the skeleton.   Original Report Authenticated By: Genevive Bi, M.D.    Dg Chest Port 1 View  10/28/2012  *RADIOLOGY REPORT*  Clinical Data: Port-A-Cath placement.  PORTABLE CHEST - 1 VIEW  Comparison: None.  Findings: The patient is rotated on the study.  Right side Port-A- Cath is in place.  Tubing appears intact.  Tip of the catheter projects in the mid to lower superior vena cava.  No pneumothorax identified.  Heart size is normal.  Lungs are clear.  The patient is status post right mastectomy and axillary dissection.  IMPRESSION: Tip of Port-A-Cath projects over the mid to lower superior vena cava.  The patient is rotated on the study.  Exact position of the tip of the catheter could be determined with the lateral film. No pneumothorax.   Original Report Authenticated By: Holley Dexter, M.D.    Dg Fluoro Guide Cv Line-no Report  10/28/2012  CLINICAL DATA: [facility]   FLOURO GUIDE CV LINE  Fluoroscopy was utilized by the requesting physician.  No radiographic  interpretation.      ASSESSMENT:   57 year old female with  #1 history of breast cancer of the right breast status post mastectomy when she was 57 years old. Due to this she was referred for genetic testing.   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 Patient's tumor is ER negative PR negative HER-2/neu negative with a Ki-67 of 21%. Patient is being seen in medical oncology for neoadjuvant chemotherapy. Patient and I discussed her pathology in detail. We discussed treatment for breast cancer including surgical and with chemotherapy. Patient  understands that she may still need a mastectomy however by giving her neoadjuvant chemotherapy her surgery may be made a little bit easier hopefully with giving her negative margins. She understands that neoadjuvant chemotherapy will also treat her distant disease.I discussed type of chemotherapy that she should have. This would include dose dense FEC x6 cycles followed by weekly Taxol carboplatinum for 12 weeks.  #3 Patient underwent staging studies with PET/CT, and received echo, port placement and chemotherapy class.    #4 candida   PLAN:   1. Doing well.  Her lab work looks good.  She will proceed with chemotherapy.  2. I prescribed Fluconazole for a yeast infection from the Cipro.  I also prescribed Magic Mouthwash for the mouth pain.    3. Ms. Secrist will return next week for her second cycle of chemotherapy.    4. Was called to infusion room.  After patient had emla cream removed from port, nurses found that insertion site from port was opened with yellow exudate.  Came and assessed and agreed.  Patient will receive chemotherapy peripherally and need to f/u with surgeon asap.  I prescribed Keflex 500mg  QID.    All questions were answered. The patient knows to call the clinic with any problems, questions or concerns. We can certainly see the patient much sooner if necessary.  I spent 25 minutes counseling the patient face to face. The total time spent in the appointment was 30 minutes.   Cherie Ouch Lyn Hollingshead, NP Medical Oncology [facility] Phone: [phone] 11/16/2012, 1:26 PM

## 2012-11-16 NOTE — Patient Instructions (Addendum)
[facility] Discharge Instructions for Patients Receiving Chemotherapy  Today you received the following chemotherapy agents :  Epirubicin, 5 FU,  Cytoxan.  To help prevent nausea and vomiting after your treatment, we encourage you to take your nausea medication as instructed by your physician.    If you develop nausea and vomiting that is not controlled by your nausea medication, call the clinic. If it is after clinic hours your family physician or the after hours number for the clinic or go to the Emergency Department.   BELOW ARE SYMPTOMS THAT SHOULD BE REPORTED IMMEDIATELY:  *FEVER GREATER THAN 100.5 F  *CHILLS WITH OR WITHOUT FEVER  NAUSEA AND VOMITING THAT IS NOT CONTROLLED WITH YOUR NAUSEA MEDICATION  *UNUSUAL SHORTNESS OF BREATH  *UNUSUAL BRUISING OR BLEEDING  TENDERNESS IN MOUTH AND THROAT WITH OR WITHOUT PRESENCE OF ULCERS  *URINARY PROBLEMS  *BOWEL PROBLEMS  UNUSUAL RASH Items with * indicate a potential emergency and should be followed up as soon as possible.  One of the nurses will contact you 24 hours after your treatment. Please let the nurse know about any problems that you may have experienced. Feel free to call the clinic you have any questions or concerns. The clinic phone number is [phone].   I have been informed and understand all the instructions given to me. I know to contact the clinic, my physician, or go to the Emergency Department if any problems should occur. I do not have any questions at this time, but understand that I may call the clinic during office hours   should I have any questions or need assistance in obtaining follow up care.    __________________________________________  _____________  __________ Signature of Patient or Authorized Representative            Date                   Time    __________________________________________ Nurse's Signature    

## 2012-11-16 NOTE — Patient Instructions (Addendum)
Doing well.  Proceed with chemotherapy.  I prescribed Fluconazole for the yeast.  Take one tablet every other day.  Please call us if you have any questions or concerns.

## 2012-11-17 ENCOUNTER — Ambulatory Visit (INDEPENDENT_AMBULATORY_CARE_PROVIDER_SITE_OTHER): Payer: BC Managed Care – PPO | Admitting: General Surgery

## 2012-11-17 ENCOUNTER — Encounter (INDEPENDENT_AMBULATORY_CARE_PROVIDER_SITE_OTHER): Payer: Self-pay | Admitting: General Surgery

## 2012-11-17 ENCOUNTER — Ambulatory Visit (HOSPITAL_BASED_OUTPATIENT_CLINIC_OR_DEPARTMENT_OTHER): Payer: BC Managed Care – PPO

## 2012-11-17 VITALS — BP 136/78 | HR 90 | Temp 98.3°F | Resp 18 | Ht 64.0 in | Wt 217.0 lb

## 2012-11-17 VITALS — BP 110/21 | HR 100 | Temp 98.0°F

## 2012-11-17 DIAGNOSIS — Z5189 Encounter for other specified aftercare: Secondary | ICD-10-CM

## 2012-11-17 DIAGNOSIS — C50919 Malignant neoplasm of unspecified site of unspecified female breast: Secondary | ICD-10-CM

## 2012-11-17 DIAGNOSIS — C50912 Malignant neoplasm of unspecified site of left female breast: Secondary | ICD-10-CM

## 2012-11-17 DIAGNOSIS — T80212A Local infection due to central venous catheter, initial encounter: Secondary | ICD-10-CM

## 2012-11-17 DIAGNOSIS — C773 Secondary and unspecified malignant neoplasm of axilla and upper limb lymph nodes: Secondary | ICD-10-CM

## 2012-11-17 MED ORDER — PEGFILGRASTIM INJECTION 6 MG/0.6ML
6.0000 mg | Freq: Once | SUBCUTANEOUS | Status: AC
  Administered 2012-11-17: 6 mg via SUBCUTANEOUS
  Filled 2012-11-17: qty 0.6

## 2012-11-17 NOTE — Patient Instructions (Signed)
Protect the Port-A-Cath incision with a clean gauze dressing. Finish her antibiotics as directed.

## 2012-11-17 NOTE — Progress Notes (Signed)
History: Patient has a history of breast cancer and is status post Port-A-Cath placement about 3 weeks ago. She had one chemotherapy treatment last week. She returned to the cancer center this week and they were concerned about the incision over the Port-A-Cath. Dr. Derrell Lolling was contacted by phone yesterday and oral antibiotics were started. She's had no fever. Generally feeling okay.  Exam: The BP 136/78  Pulse 90  Temp(Src) 98.3 F (36.8 C) (Temporal)  Resp 18  Ht 5\' 4"  (1.626 m)  Wt 217 lb (98.431 kg)  BMI 37.23 kg/m2 General: No distress Incisions: The incision over the Port-A-Cath on the right chest wall has a several millimeter skin separation directly over the port with some eschar between the skin overlying the port. The port itself was not exposed but the tissue is fairly thin.  Assessment and plan: Wound separation over her Port-A-Cath. I did not see active infection but think it is reasonable to cover her with antibiotics for the time being. I don't take any specific wound care would be helpful. She is to protect the wound with a dry gauze dressing. She can clean it briefly in the shower daily. I will see her next week for a wound recheck as Dr. Luisa Hart is out-of-town

## 2012-11-23 ENCOUNTER — Encounter: Payer: Self-pay | Admitting: Adult Health

## 2012-11-23 ENCOUNTER — Other Ambulatory Visit (HOSPITAL_BASED_OUTPATIENT_CLINIC_OR_DEPARTMENT_OTHER): Payer: BC Managed Care – PPO | Admitting: Lab

## 2012-11-23 ENCOUNTER — Ambulatory Visit (HOSPITAL_BASED_OUTPATIENT_CLINIC_OR_DEPARTMENT_OTHER): Payer: BC Managed Care – PPO | Admitting: Adult Health

## 2012-11-23 ENCOUNTER — Telehealth: Payer: Self-pay | Admitting: Medical Oncology

## 2012-11-23 VITALS — BP 131/85 | HR 86 | Temp 97.6°F | Resp 20 | Ht 64.0 in | Wt 212.4 lb

## 2012-11-23 DIAGNOSIS — C50919 Malignant neoplasm of unspecified site of unspecified female breast: Secondary | ICD-10-CM

## 2012-11-23 DIAGNOSIS — C50912 Malignant neoplasm of unspecified site of left female breast: Secondary | ICD-10-CM

## 2012-11-23 DIAGNOSIS — Z171 Estrogen receptor negative status [ER-]: Secondary | ICD-10-CM

## 2012-11-23 LAB — COMPREHENSIVE METABOLIC PANEL (CC13)
AST: 14 U/L (ref 5–34)
Albumin: 3.6 g/dL (ref 3.5–5.0)
BUN: 18.6 mg/dL (ref 7.0–26.0)
CO2: 29 mEq/L (ref 22–29)
Calcium: 9.7 mg/dL (ref 8.4–10.4)
Chloride: 101 mEq/L (ref 98–107)
Glucose: 175 mg/dl — ABNORMAL HIGH (ref 70–99)
Potassium: 3.3 mEq/L — ABNORMAL LOW (ref 3.5–5.1)

## 2012-11-23 LAB — CBC WITH DIFFERENTIAL/PLATELET
Basophils Absolute: 0 10*3/uL (ref 0.0–0.1)
Eosinophils Absolute: 0 10*3/uL (ref 0.0–0.5)
HGB: 12.7 g/dL (ref 11.6–15.9)
MONO#: 0 10*3/uL — ABNORMAL LOW (ref 0.1–0.9)
NEUT#: 0.5 10*3/uL — CL (ref 1.5–6.5)
RDW: 13.3 % (ref 11.2–14.5)
lymph#: 0.8 10*3/uL — ABNORMAL LOW (ref 0.9–3.3)

## 2012-11-23 MED ORDER — POTASSIUM CHLORIDE CRYS ER 20 MEQ PO TBCR: 20.0000 meq | EXTENDED_RELEASE_TABLET | Freq: Every day | ORAL | Status: DC

## 2012-11-23 NOTE — Telephone Encounter (Signed)
Message copied by Rexene Edison on Tue Nov 23, 2012  4:49 PM ------      Message from: Laural Golden      Created: Tue Nov 23, 2012  3:59 PM       Patient needs Kdur po daily x 7 days, disp 7.              Please call into pharmacy and call patient.             Thanks, L            ----- Message -----         From: [facility] Interface         Sent: 11/23/2012   1:01 PM           To: Victorino December, MD                   ------

## 2012-11-23 NOTE — Patient Instructions (Signed)
  Patient Neutropenia Instruction Sheet  Diagnosis: Breast Cancer      Treating Physician: Drue Second, MD  Treatment: 1. Type of chemotherapy: FEC  2. Date of last treatment: 11/16/12  Last Blood Counts: Lab Results  Component Value Date   WBC 1.3* 11/23/2012   HGB 12.7 11/23/2012   HCT 38.4 11/23/2012   MCV 91.4 11/23/2012   PLT 112* 11/23/2012  ANC 500     Instructions: 1. Monitor temperature and call if fever  greater than 100.5, chills, shaking chills (rigors) 2. Call Physician on-call at [phone] 3. Give him/her symptoms and list of medications that you are taking and your last blood count.

## 2012-11-23 NOTE — Progress Notes (Signed)
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   PRIOR THERAPY:  1. Patient has history of right breast cancer patient underwent a mastectomy when she was 57 years old. This occurred during her pregnancy. After that patient did not take any kind of further treatments. She continued to do well.   2.Recently she noted changes in her nipple had become itchy and retracted. Because of this she underwent mammogram with ultrasound that showed 2 areas in the left breast one at 12:00 position and a second at 3:00 position. She also had an ultrasound performed that showed the diameter to be at least 6 cm and multiple left axillary lymph nodes that were abnormal. She then went on to have a biopsy performed on 09/30/2012. 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 Patient went on to have MRIs of the breasts performed and was noted to be marked diffuse enhancement involving majority of the left breast measuring 13.0 x 12.5 x 9.5 cm. There was diffuse skin thickening and dermal enhancement. Multiple enlarged level I left axillary lymph nodes. The largest lymph node measured 2.4 cm in size. The large area of enhancement within the left breast extends posteriorly to abut the anterior portion of the pectoralis muscle. There was no evidence of internal mammary adenopathy or right axillary adenopathy.  PET/CT did show the left breast mass, and left axillary and subpectoralis nodal metastases, but no further areas throughout the chest/abd/pelvis of metastases.   3.  Patient currently undergoing  neoadjuvant chemotherapy with FEC x 6 cycles starting on 11/02/12, which will be followed by 12 cycles of weekly Taxol/Carbo.  CURRENT THERAPY:FEC cycle 2 day 8  INTERVAL HISTORY: Christine [name] 57 y.o. female returns for follow up today.  She is doing well today.  She is neutropenic.  She was seen last Dr. Johna Sheriff last Wednesday regarding her port, and the incision that had opened underneath it.  He put her on Doxycycline 100mg  BID.  She is tolerating the medication well.  She has kept the area clean, dry and bandaged.  She denies fevers, chills, nausea, vomiting, diarrhea, constipation, diarrhea, numbness, or any other concerns.    MEDICAL HISTORY: Past Medical History  Diagnosis Date  . ALLERGIC RHINITIS   . GERD (gastroesophageal reflux disease)   . Hypertension   . Hypothyroidism   . Hx of colonic polyps   . Wears glasses   . Breast cancer 52, age 89    right  . Breast cancer 09/30/12    left, ER/PR -, Her 2 -    ALLERGIES:  is allergic to sulfa antibiotics and sulfonamide derivatives.  MEDICATIONS:  Current Outpatient Prescriptions  Medication Sig Dispense Refill  . Alum & Mag Hydroxide-Simeth (MAGIC MOUTHWASH) SOLN Take 5 mLs by mouth 4 (four) times daily.  280 mL  0  . aspirin 81 MG tablet Take 81 mg by mouth daily.      . cephALEXin (KEFLEX) 500 MG capsule Take 1 capsule (500 mg total) by mouth 4 (four) times daily.  40 capsule  0  . dexamethasone (DECADRON) 4 MG tablet Take 2 tablets by  mouth once a day on the day after chemotherapy and then take 2 tablets two times a day for 2 days. Take with food.  30 tablet  1  . diltiazem (CARDIZEM CD) 240 MG 24 hr capsule Take 1 capsule (240 mg total) by mouth daily.  90 capsule  2  . doxycycline (VIBRA-TABS) 100 MG tablet Take 1 tablet (100 mg total) by mouth 2 (two) times daily.  28 tablet  2  . fluconazole (DIFLUCAN) 200 MG tablet Take 1 tablet (200 mg total) by mouth every other day.  4 tablet  4  . hydrochlorothiazide (MICROZIDE)  12.5 MG capsule TAKE 1 CAPSULE (12.5 MG TOTAL) BY MOUTH EVERY MORNING.  90 capsule  2  . latanoprost (XALATAN) 0.005 % ophthalmic solution Place 1 drop into both eyes at bedtime.       Marland Kitchen levothyroxine (SYNTHROID, LEVOTHROID) 50 MCG tablet TAKE 1 TABLET (50 MCG TOTAL) BY MOUTH DAILY.  90 tablet  2  . lidocaine-prilocaine (EMLA) cream Apply topically as needed.  30 g  6  . LORazepam (ATIVAN) 0.5 MG tablet Take 1 tablet (0.5 mg total) by mouth every 6 (six) hours as needed (Nausea or vomiting).  30 tablet  0  . NEXIUM 40 MG capsule TAKE 1 CAPSULE (40 MG TOTAL) BY MOUTH DAILY BEFORE BREAKFAST.  90 capsule  1  . ondansetron (ZOFRAN) 8 MG tablet Take 1 tablet (8 mg total) by mouth 2 (two) times daily as needed. Take two times a day as needed for nausea or vomiting starting on the third day after chemotherapy.  30 tablet  1  . oxyCODONE-acetaminophen (ROXICET) 5-325 MG per tablet Take 1 tablet by mouth every 4 (four) hours as needed for pain.  30 tablet  0  . PATADAY 0.2 % SOLN       . prochlorperazine (COMPAZINE) 10 MG tablet Take 1 tablet (10 mg total) by mouth every 6 (six) hours as needed (Nausea or vomiting).  30 tablet  1  . prochlorperazine (COMPAZINE) 25 MG suppository Place 1 suppository (25 mg total) rectally every 12 (twelve) hours as needed for nausea.  12 suppository  3   No current facility-administered medications for this visit.    SURGICAL HISTORY:  Past Surgical History  Procedure Laterality Date  . Tonsillectomy    . Caesarean section      x 1  . Mastectomy  04/1987    right side  . Abdominal hysterectomy  06/1988    fibroids  . Portacath placement Right 10/28/2012    Procedure: PORT PLACEMENT;  Surgeon: Clovis Pu. Cornett, MD;  Location: [facility];  Service: General;  Laterality: Right;  Right Subclavian Vein    REVIEW OF SYSTEMS:  General: fatigue (-), night sweats (-), fever (-), pain (-) Lymph: palpable nodes (-) HEENT: vision changes (-), mucositis (-),  gum bleeding (-), epistaxis (-) Cardiovascular: chest pain (-), palpitations (-) Pulmonary: shortness of breath (-), dyspnea on exertion (-), cough (-), hemoptysis (-) GI:  Early satiety (-), melena (-), dysphagia (-), nausea/vomiting (-), diarrhea (-) GU: dysuria (-), hematuria (-), incontinence (-) Musculoskeletal: joint swelling (-), joint pain (-), back pain (-) Neuro: weakness (-), numbness (-), headache (-), confusion (-) Skin: Rash (-), lesions (+), dryness (-) Psych: depression (-), suicidal/homicidal ideation (-), feeling of hopelessness (-)   PHYSICAL EXAMINATION: Blood pressure 131/85, pulse 86, temperature 97.6 F (36.4 C), temperature source Oral, resp. rate 20, height 5\' 4"  (1.626 m), weight 212 lb 6.4 oz (96.344 kg).  Body mass index is 36.44 kg/(m^2). General: Patient is a well appearing female in no acute distress HEENT: PERRLA, sclerae anicteric no conjunctival pallor, MMM. Small red lesions on tip of tongue and left buccal mucosa. Neck: supple, no palpable adenopathy Lungs: clear to auscultation bilaterally, no wheezes, rhonchi, or rales Cardiovascular: regular rate rhythm, S1, S2, no murmurs, rubs or gallops Abdomen: Soft, non-tender, non-distended, normoactive bowel sounds, no HSM Extremities: warm and well perfused, no clubbing, cyanosis, or edema Skin: No rashes or lesions Neuro: Non-focal Breasts: Right mastectomy site without nodularity or sign of recurrence.  Left breast mass palpable along with dimpling of skin and inversion of the nipple.   ECOG PERFORMANCE STATUS: 1 - Symptomatic but completely ambulatory  LABORATORY DATA: Lab Results  Component Value Date   WBC 1.3* 11/23/2012   HGB 12.7 11/23/2012   HCT 38.4 11/23/2012   MCV 91.4 11/23/2012   PLT 112* 11/23/2012      Chemistry      Component Value Date/Time   NA 137 11/09/2012 0949   NA 144 10/28/2012 1248   K 3.4* 11/09/2012 0949   K 3.9 10/28/2012 1248   CL 101 11/09/2012 0949   CL 108 10/28/2012 1248    CO2 27 11/09/2012 0949   CO2 29 09/13/2011 1910   BUN 18.6 11/09/2012 0949   BUN 10 10/28/2012 1248   CREATININE 0.9 11/09/2012 0949   CREATININE 0.80 10/28/2012 1248      Component Value Date/Time   CALCIUM 9.1 11/09/2012 0949   CALCIUM 9.4 09/13/2011 1910   ALKPHOS 92 11/09/2012 0949   ALKPHOS 78 09/13/2011 1910   AST 10 11/09/2012 0949   AST 18 09/13/2011 1910   ALT 10 11/09/2012 0949   ALT 12 09/13/2011 1910   BILITOT 0.74 11/09/2012 0949   BILITOT 0.3 09/13/2011 1910       RADIOGRAPHIC STUDIES:  Ct Chest W Contrast  10/27/2012  *RADIOLOGY REPORT*  Clinical Data:  High risk breast cancer staging.  Right breast cancer diagnosed 1988.  Left breast cancer diagnosed February 2014.  CT CHEST, ABDOMEN AND PELVIS WITH CONTRAST  Technique:  Multidetector CT imaging of the chest, abdomen and pelvis was performed following the standard protocol during bolus administration of intravenous contrast.  Contrast: OMNIPAQUE IOHEXOL 300 MG/ML  SOLN  Comparison:  PET CT scan 03/26 1014  CT CHEST  Findings:  Right breast mastectomy anatomy.  There is thickening of the skin of the left breast to 11 mm.  There is a mass within the deep posterior left breast measuring 29 x 30 mm (image 6).  There are enlarged round abnormal lymph nodes in the left axilla measuring up to 20 mm short axis (image 22).  Lymph nodes extend beneath the sub pectoralis muscles on the left (image 12).  There is no evidence of internal mammary lymphadenopathy.  No infraclavicular adenopathy.  No mediastinal adenopathy.  No pericardial fluid.  No suspicious pulmonary nodules.  IMPRESSION:  1.  Left breast mass consistent with breast carcinoma. 2.  Skin thickening over the left breast is concerning for inflammatory breast cancer. 3.  Nodal metastasis in the left axilla and sub pectoralis nodal stations. 4.  No evidence of central nodal metastasis or mediastinal metastasis  CT ABDOMEN AND PELVIS  Findings:  No focal hepatic lesion.  The gallbladder, pancreas,  spleen, adrenal gland is, and kidneys are normal.  The stomach, small bowel, appendix, cecum are normal.  The colon and rectosigmoid colon are normal.  Abdominal aorta  is normal caliber.  No retroperitoneal or periportal lymphadenopathy.  No mesenteric or peritoneal disease.  No free fluid in  the pelvis.  Post hysterectomy anatomy.  No pelvic lymphadenopathy. Review of  bone windows demonstrates no aggressive osseous lesions.  IMPRESSION:  No evidence of metastasis in the abdomen or pelvis.   Original Report Authenticated By: Genevive Bi, M.D.    Ct Abdomen Pelvis W Contrast  10/27/2012  *RADIOLOGY REPORT*  Clinical Data:  High risk breast cancer staging.  Right breast cancer diagnosed 1988.  Left breast cancer diagnosed February 2014.  CT CHEST, ABDOMEN AND PELVIS WITH CONTRAST  Technique:  Multidetector CT imaging of the chest, abdomen and pelvis was performed following the standard protocol during bolus administration of intravenous contrast.  Contrast: OMNIPAQUE IOHEXOL 300 MG/ML  SOLN  Comparison:  PET CT scan 03/26 1014  CT CHEST  Findings:  Right breast mastectomy anatomy.  There is thickening of the skin of the left breast to 11 mm.  There is a mass within the deep posterior left breast measuring 29 x 30 mm (image 6).  There are enlarged round abnormal lymph nodes in the left axilla measuring up to 20 mm short axis (image 22).  Lymph nodes extend beneath the sub pectoralis muscles on the left (image 12).  There is no evidence of internal mammary lymphadenopathy.  No infraclavicular adenopathy.  No mediastinal adenopathy.  No pericardial fluid.  No suspicious pulmonary nodules.  IMPRESSION:  1.  Left breast mass consistent with breast carcinoma. 2.  Skin thickening over the left breast is concerning for inflammatory breast cancer. 3.  Nodal metastasis in the left axilla and sub pectoralis nodal stations. 4.  No evidence of central nodal metastasis or mediastinal metastasis  CT ABDOMEN AND PELVIS   Findings:  No focal hepatic lesion.  The gallbladder, pancreas, spleen, adrenal gland is, and kidneys are normal.  The stomach, small bowel, appendix, cecum are normal.  The colon and rectosigmoid colon are normal.  Abdominal aorta is normal caliber.  No retroperitoneal or periportal lymphadenopathy.  No mesenteric or peritoneal disease.  No free fluid in  the pelvis.  Post hysterectomy anatomy.  No pelvic lymphadenopathy. Review of  bone windows demonstrates no aggressive osseous lesions.  IMPRESSION:  No evidence of metastasis in the abdomen or pelvis.   Original Report Authenticated By: Genevive Bi, M.D.    Mr Breast Bilateral W Wo Contrast  10/12/2012  *RADIOLOGY REPORT*  Clinical Data: History of previous malignant mastectomy of the right breast 25 years ago.  The patient developed diffuse skin thickening and firmness within the left breast.  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  BUN and creatinine were obtained on site at [facility] at [address] Results:  BUN 7 mg/dL,  Creatinine 1.0 mg/dL.  BILATERAL BREAST MRI WITH AND WITHOUT CONTRAST  Technique: Multiplanar, multisequence MR images of both breasts were obtained prior to and following the intravenous administration of 20ml of Multihance.  Three dimensional images were evaluated at the independent DynaCad workstation.  Comparison:  Mammograms dated 09/30/2012 and 09/28/2012.  Findings: There has been a previous right mastectomy.  There is marked diffuse enhancement involving the majority of the left breast.  This measures 13.0 x 12.5 x 9.5 cm in size.  This is associated with a mixture of plateau and washout enhancement kinetics.  In addition,  there is diffuse  skin thickening and dermal enhancement.  There are multiple enlarged level I left axillary lymph nodes.  The largest lymph node measures 2.4 cm  in size.  The large area of enhancement within the left breast extends posteriorly to abut the anterior portion of the pectoral muscle. There is no evidence for internal mammary adenopathy or right axillary adenopathy.  There are no additional findings.  IMPRESSION:  1.  Large ( 13 cm) area of enhancement involving the majority of the left breast consistent with the patient's known invasive ductal carcinoma and DCIS. This extends posteriorly to abut the pectoralis muscle. 2.  Multiple enlarged level I left axillary lymph nodes. 3.  Previous right mastectomy.  RECOMMENDATION: Treatment plan  THREE-DIMENSIONAL MR IMAGE RENDERING ON INDEPENDENT WORKSTATION:  Three-dimensional MR images were rendered by post-processing of the original MR data on an independent workstation.  The three- dimensional MR images were interpreted, and findings were reported in the accompanying complete MRI report for this study.  BI-RADS CATEGORY 6:  Known biopsy-proven malignancy - appropriate action should be taken.   Original Report Authenticated By: Rolla Plate, M.D.    Nm Pet Image Initial (pi) Skull Base To Thigh  10/27/2012  *RADIOLOGY REPORT*  Clinical Data: Initial treatment strategy for high risk of breast cancer. Triple negative  NUCLEAR MEDICINE PET SKULL BASE TO THIGH  Fasting Blood Glucose:  97  Technique:  17.9 mCi F-18 FDG was injected intravenously. CT data was obtained and used for attenuation correction and anatomic localization only.  (This was not acquired as a diagnostic CT examination.) Additional exam technical data entered on technologist worksheet.  Comparison:  Findings:  Neck: No hypermetabolic lymph nodes in the neck.  Chest:  There is intense metabolic activity ( SUV max = 16.1) associated with a large portion of the glandular tissue of the left breast consistent with carcinoma.  There are intensely hypermetabolic left axillary lymph nodes with SUV max = 15.1.  These nodes are rounded and measure up to 2  cm. Hypermetabolic nodes extend to the sub pectoralis nodal station (image 71).  There is a single hypermetabolic internal mammary lymph node measuring 5 mm just left of the sternum (image 87).  This has intense metabolic activity for size with SUV max = 7.1.  No hypermetabolic mediastinal lymph nodes.  No hypermetabolic pulmonary nodules.  Abdomen/Pelvis:  No abnormal hypermetabolic activity within the liver, pancreas, adrenal glands, or spleen.  No hypermetabolic lymph nodes in the abdomen or pelvis.  Skeleton:  No focal hypermetabolic activity to suggest skeletal metastasis.  IMPRESSION:  1.  Hypermetabolic breast tissue on the left consistent with primary carcinoma. 2.  Hypermetabolic nodal metastasis in the left axilla extending to the sub pectoralis nodes. 3.  Single hypermetabolic left internal mammary lymph node.  4.  No evidence of pulmonary metastasis.  5.  No evidence of metastasis beneath hemidiaphragms or within the skeleton.   Original Report Authenticated By: Genevive Bi, M.D.    Dg Chest Port 1 View  10/28/2012  *RADIOLOGY REPORT*  Clinical Data: Port-A-Cath placement.  PORTABLE CHEST - 1 VIEW  Comparison: None.  Findings: The patient is rotated on the study.  Right side Port-A- Cath is in place.  Tubing appears intact.  Tip of the catheter projects in the mid to lower superior vena cava.  No pneumothorax identified.  Heart size is normal.  Lungs are clear.  The patient is status post right mastectomy and axillary dissection.  IMPRESSION: Tip of Port-A-Cath projects over the mid  to lower superior vena cava.  The patient is rotated on the study.  Exact position of the tip of the catheter could be determined with the lateral film. No pneumothorax.   Original Report Authenticated By: Holley Dexter, M.D.    Dg Fluoro Guide Cv Line-no Report  10/28/2012  CLINICAL DATA: [facility]   FLOURO GUIDE CV LINE  Fluoroscopy was utilized by the requesting physician.  No radiographic  interpretation.       ASSESSMENT:   57 year old female with  #1 history of breast cancer of the right breast status post mastectomy when she was 57 years old. Due to this she was referred for genetic testing.   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 Patient's tumor is ER negative PR negative HER-2/neu negative with a Ki-67 of 21%. Patient is being seen in medical oncology for neoadjuvant chemotherapy. Patient and I discussed her pathology in detail. We discussed treatment for breast cancer including surgical and with chemotherapy. Patient understands that she may still need a mastectomy however by giving her neoadjuvant chemotherapy her surgery may be made a little bit easier hopefully with giving her negative margins. She understands that neoadjuvant chemotherapy will also treat her distant disease.I discussed type of chemotherapy that she should have. This would include dose dense FEC x6 cycles followed by weekly Taxol carboplatinum for 12 weeks.  #3 Patient underwent staging studies with PET/CT, and received echo, port placement and chemotherapy class.    #4 Open port wound  PLAN:   1. Doing well.  She is subsequently neutropenic.  Instructions reviewed with patient.  She will stay on Doxycycline BID.    2. Ms. Curnutt will return next week for her second cycle of chemotherapy.    3. She will f/u with Dr. Derrell Lolling on Friday regarding the port.    All questions were answered. The patient knows to call the clinic with any problems, questions or concerns. We can certainly see the patient much sooner if necessary.  I spent 25 minutes counseling the patient face to face. The total time spent in the appointment was 30 minutes.   Cherie Ouch Lyn Hollingshead, NP Medical Oncology [facility] Phone: [phone] 11/23/2012, 1:20 PM

## 2012-11-23 NOTE — Telephone Encounter (Signed)
Per NP, informed patient her potassium result @ 3.3 and NP would like for her to take Kdur 20 meq by mouth daily for 7 days. Patient expressed verbal understanding, prescription escribed to her listed pharmacy. No further questions at this time.

## 2012-11-24 ENCOUNTER — Telehealth: Payer: Self-pay | Admitting: *Deleted

## 2012-11-24 ENCOUNTER — Encounter: Payer: Self-pay | Admitting: Genetic Counselor

## 2012-11-24 NOTE — Telephone Encounter (Signed)
Patient called reporting she woke up with eye pain and light sensitivity.  Could barely open her eyes.  Also some congestion this morning.  Has taken aleve and claritin.  Asked if this is a side effect of chemotherapy or her allergies.  Denies drainage from eyes and has only used an eye drop for itching in her eyes.   Informed her the CEF she received on 11-16-2012 does not cause these symptoms.  Suggested she contact provider who manages her allergies for instructions on how to manage these symptoms due to this allergy season is predicted to be one of the worst experienced for our area.

## 2012-11-26 ENCOUNTER — Encounter (INDEPENDENT_AMBULATORY_CARE_PROVIDER_SITE_OTHER): Payer: Self-pay | Admitting: General Surgery

## 2012-11-26 ENCOUNTER — Ambulatory Visit (INDEPENDENT_AMBULATORY_CARE_PROVIDER_SITE_OTHER): Payer: BC Managed Care – PPO | Admitting: General Surgery

## 2012-11-26 VITALS — BP 138/86 | HR 60 | Resp 16 | Ht 64.0 in | Wt 214.0 lb

## 2012-11-26 DIAGNOSIS — T80212A Local infection due to central venous catheter, initial encounter: Secondary | ICD-10-CM

## 2012-11-26 NOTE — Progress Notes (Signed)
History: Patient returns for followup of her Port-A-Cath wound with some superficial wound breakdown and mild cellulitis. She thinks is doing a little bit better.  She has had no fever or chills.  Exam: BP 138/86  Pulse 60  Resp 16  Ht 5\' 4"  (1.626 m)  Wt 214 lb (97.07 kg)  BMI 36.72 kg/m2 Again noted is some slight skin separation of the Port-A-Cath wound with about a 1-2 mm area of eschar but no cellulitis. This looks a little better than one week ago.   Assessment and plan: superficial wound breakdown at Port-A-Cath site. This is stable or slightly improving. She is completing her antibiotics and I do not think needs any further antibiotics. I have to come back in one week for recheck. I think it is okay to use the Port-A-Cath for chemotherapy next week.

## 2012-11-30 ENCOUNTER — Ambulatory Visit (HOSPITAL_BASED_OUTPATIENT_CLINIC_OR_DEPARTMENT_OTHER): Payer: BC Managed Care – PPO | Admitting: Adult Health

## 2012-11-30 ENCOUNTER — Ambulatory Visit: Payer: BC Managed Care – PPO

## 2012-11-30 ENCOUNTER — Encounter (INDEPENDENT_AMBULATORY_CARE_PROVIDER_SITE_OTHER): Payer: BC Managed Care – PPO | Admitting: Surgery

## 2012-11-30 ENCOUNTER — Other Ambulatory Visit (HOSPITAL_BASED_OUTPATIENT_CLINIC_OR_DEPARTMENT_OTHER): Payer: BC Managed Care – PPO | Admitting: Lab

## 2012-11-30 ENCOUNTER — Encounter: Payer: Self-pay | Admitting: Adult Health

## 2012-11-30 ENCOUNTER — Encounter: Payer: Self-pay | Admitting: *Deleted

## 2012-11-30 VITALS — BP 133/83 | HR 103 | Temp 98.3°F | Resp 20 | Ht 64.0 in | Wt 211.8 lb

## 2012-11-30 DIAGNOSIS — Z853 Personal history of malignant neoplasm of breast: Secondary | ICD-10-CM

## 2012-11-30 DIAGNOSIS — C50912 Malignant neoplasm of unspecified site of left female breast: Secondary | ICD-10-CM

## 2012-11-30 DIAGNOSIS — C773 Secondary and unspecified malignant neoplasm of axilla and upper limb lymph nodes: Secondary | ICD-10-CM

## 2012-11-30 DIAGNOSIS — C50919 Malignant neoplasm of unspecified site of unspecified female breast: Secondary | ICD-10-CM

## 2012-11-30 DIAGNOSIS — Z171 Estrogen receptor negative status [ER-]: Secondary | ICD-10-CM

## 2012-11-30 LAB — CBC WITH DIFFERENTIAL/PLATELET
Eosinophils Absolute: 0 10*3/uL (ref 0.0–0.5)
LYMPH%: 17.1 % (ref 14.0–49.7)
MONO#: 0.8 10*3/uL (ref 0.1–0.9)
NEUT#: 8.6 10*3/uL — ABNORMAL HIGH (ref 1.5–6.5)
Platelets: 138 10*3/uL — ABNORMAL LOW (ref 145–400)
RBC: 3.54 10*6/uL — ABNORMAL LOW (ref 3.70–5.45)
WBC: 11.4 10*3/uL — ABNORMAL HIGH (ref 3.9–10.3)
lymph#: 2 10*3/uL (ref 0.9–3.3)
nRBC: 2 % — ABNORMAL HIGH (ref 0–0)

## 2012-11-30 NOTE — Progress Notes (Signed)
OFFICE PROGRESS NOTE  CC**  Christine Mew, MD [address] 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   PRIOR THERAPY:  1. Patient has history of right breast cancer patient underwent a mastectomy when she was 57 years old. This occurred during her pregnancy. After that patient did not take any kind of further treatments. She continued to do well.   2.Recently she noted changes in her nipple had become itchy and retracted. Because of this she underwent mammogram with ultrasound that showed 2 areas in the left breast one at 12:00 position and a second at 3:00 position. She also had an ultrasound performed that showed the diameter to be at least 6 cm and multiple left axillary lymph nodes that were abnormal. She then went on to have a biopsy performed on 09/30/2012. 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 Patient went on to have MRIs of the breasts performed and was noted to be marked diffuse enhancement involving majority of the left breast measuring 13.0 x 12.5 x 9.5 cm. There was diffuse skin thickening and dermal enhancement. Multiple enlarged level I left axillary lymph nodes. The largest lymph node measured 2.4 cm in size. The large area of enhancement within the left breast extends posteriorly to abut the anterior portion of the pectoralis muscle. There was no evidence of internal mammary adenopathy or right axillary adenopathy.  PET/CT did show the left breast mass, and left axillary and subpectoralis nodal metastases, but no further areas throughout the chest/abd/pelvis of metastases.   3.  Patient currently undergoing  neoadjuvant chemotherapy with FEC x 6 cycles starting on 11/02/12, which will be followed by 12 cycles of weekly Taxol/Carbo.  CURRENT THERAPY:FEC cycle 3 day 1  INTERVAL HISTORY: Christine [name] 56 y.o. female returns for follow up today.  She is doing well today. She saw Dr. Johna Sheriff regarding the skin separation over her port this past Friday.  She has one more day of Doxycycline to go.  She has not had any fevers, chills, numbness.  She is tired, and has had some constipation that she is using ground chia seed for.  She is drinking around 64 ounces of fluids per day, and has had an occasional episode of dizziness--no palpitations, chest pain, or any other associated symptoms.  Otherwise, a 10 point ROS is neg.   MEDICAL HISTORY: Past Medical History  Diagnosis Date  . ALLERGIC RHINITIS   . GERD (gastroesophageal reflux disease)   . Hypertension   . Hypothyroidism   . Hx of colonic polyps   . Wears glasses   . Breast cancer 69, age 64    right  . Breast cancer 09/30/12    left, ER/PR -, Her 2 -    ALLERGIES:  is allergic to sulfa antibiotics and sulfonamide derivatives.  MEDICATIONS:  Current Outpatient Prescriptions  Medication Sig Dispense Refill  . Alum & Mag Hydroxide-Simeth (MAGIC MOUTHWASH) SOLN Take 5 mLs by mouth 4 (four) times daily.  280 mL  0  . dexamethasone (DECADRON) 4 MG tablet Take 2 tablets by mouth once a day on the day after chemotherapy and then take 2 tablets two times a day for 2  days. Take with food.  30 tablet  1  . diltiazem (CARDIZEM CD) 240 MG 24 hr capsule Take 1 capsule (240 mg total) by mouth daily.  90 capsule  2  . doxycycline (VIBRA-TABS) 100 MG tablet Take 1 tablet (100 mg total) by mouth 2 (two) times daily.  28 tablet  2  . fluconazole (DIFLUCAN) 200 MG tablet Take 1 tablet by mouth every other day.      . hydrochlorothiazide (MICROZIDE) 12.5 MG capsule TAKE 1 CAPSULE (12.5 MG TOTAL) BY MOUTH EVERY MORNING.  90 capsule  2  . latanoprost (XALATAN)  0.005 % ophthalmic solution Place 1 drop into both eyes at bedtime.       Marland Kitchen levothyroxine (SYNTHROID, LEVOTHROID) 50 MCG tablet TAKE 1 TABLET (50 MCG TOTAL) BY MOUTH DAILY.  90 tablet  2  . lidocaine-prilocaine (EMLA) cream Apply topically as needed.  30 g  6  . LORazepam (ATIVAN) 0.5 MG tablet Take 1 tablet (0.5 mg total) by mouth every 6 (six) hours as needed (Nausea or vomiting).  30 tablet  0  . NEXIUM 40 MG capsule TAKE 1 CAPSULE (40 MG TOTAL) BY MOUTH DAILY BEFORE BREAKFAST.  90 capsule  1  . ondansetron (ZOFRAN) 8 MG tablet Take 1 tablet (8 mg total) by mouth 2 (two) times daily as needed. Take two times a day as needed for nausea or vomiting starting on the third day after chemotherapy.  30 tablet  1  . PATADAY 0.2 % SOLN       . potassium chloride SA (KLOR-CON M20) 20 MEQ tablet Take 1 tablet (20 mEq total) by mouth daily. For 7 days  7 tablet  0  . prochlorperazine (COMPAZINE) 10 MG tablet Take 1 tablet (10 mg total) by mouth every 6 (six) hours as needed (Nausea or vomiting).  30 tablet  1  . prochlorperazine (COMPAZINE) 25 MG suppository Place 1 suppository (25 mg total) rectally every 12 (twelve) hours as needed for nausea.  12 suppository  3   No current facility-administered medications for this visit.    SURGICAL HISTORY:  Past Surgical History  Procedure Laterality Date  . Tonsillectomy    . Caesarean section      x 1  . Mastectomy  04/1987    right side  . Abdominal hysterectomy  06/1988    fibroids  . Portacath placement Right 10/28/2012    Procedure: PORT PLACEMENT;  Surgeon: Clovis Pu. Cornett, MD;  Location: [facility];  Service: General;  Laterality: Right;  Right Subclavian Vein    REVIEW OF SYSTEMS:  General: fatigue (+), night sweats (-), fever (-), pain (-) Lymph: palpable nodes (-) HEENT: vision changes (-), mucositis (-), gum bleeding (-), epistaxis (-) Cardiovascular: chest pain (-), palpitations (-) Pulmonary: shortness of breath (-), dyspnea  on exertion (-), cough (-), hemoptysis (-) GI:  Early satiety (-), melena (-), dysphagia (-), nausea/vomiting (-), diarrhea (-) GU: dysuria (-), hematuria (-), incontinence (-) Musculoskeletal: joint swelling (-), joint pain (-), back pain (-) Neuro: weakness (-), numbness (-), headache (-), confusion (-) Skin: Rash (-), lesions (+), dryness (-) Psych: depression (-), suicidal/homicidal ideation (-), feeling of hopelessness (-)   PHYSICAL EXAMINATION: Blood pressure 133/83, pulse 103, temperature 98.3 F (36.8 C), temperature source Oral, resp. rate 20, height 5\' 4"  (1.626 m), weight 211 lb 12.8 oz (96.072 kg). Body mass index is 36.34 kg/(m^2). General: Patient is a well appearing female in no acute distress HEENT: PERRLA, sclerae anicteric no conjunctival  pallor, MMM. Small red lesions on tip of tongue and left buccal mucosa. Neck: supple, no palpable adenopathy Lungs: clear to auscultation bilaterally, no wheezes, rhonchi, or rales Cardiovascular: regular rate rhythm, S1, S2, no murmurs, rubs or gallops Abdomen: Soft, non-tender, non-distended, normoactive bowel sounds, no HSM Extremities: warm and well perfused, no clubbing, cyanosis, or edema Skin: No rashes or lesions Neuro: Non-focal Breasts: Right mastectomy site without nodularity or sign of recurrence.  Left breast mass palpable along with dimpling of skin and inversion of the nipple--softer.  Right chest wall port with open incision site.  Yellow tissue at wound bed, no erythema, tenderness, warmth, or exudate.  ECOG PERFORMANCE STATUS: 1 - Symptomatic but completely ambulatory  LABORATORY DATA: Lab Results  Component Value Date   WBC 11.4* 11/30/2012   HGB 10.7* 11/30/2012   HCT 32.2* 11/30/2012   MCV 91.0 11/30/2012   PLT 138* 11/30/2012      Chemistry      Component Value Date/Time   NA 141 11/23/2012 1246   NA 144 10/28/2012 1248   K 3.3* 11/23/2012 1246   K 3.9 10/28/2012 1248   CL 101 11/23/2012 1246   CL 108  10/28/2012 1248   CO2 29 11/23/2012 1246   CO2 29 09/13/2011 1910   BUN 18.6 11/23/2012 1246   BUN 10 10/28/2012 1248   CREATININE 1.0 11/23/2012 1246   CREATININE 0.80 10/28/2012 1248      Component Value Date/Time   CALCIUM 9.7 11/23/2012 1246   CALCIUM 9.4 09/13/2011 1910   ALKPHOS 99 11/23/2012 1246   ALKPHOS 78 09/13/2011 1910   AST 14 11/23/2012 1246   AST 18 09/13/2011 1910   ALT 19 11/23/2012 1246   ALT 12 09/13/2011 1910   BILITOT 0.48 11/23/2012 1246   BILITOT 0.3 09/13/2011 1910       RADIOGRAPHIC STUDIES:  Ct Chest W Contrast  10/27/2012  *RADIOLOGY REPORT*  Clinical Data:  High risk breast cancer staging.  Right breast cancer diagnosed 1988.  Left breast cancer diagnosed February 2014.  CT CHEST, ABDOMEN AND PELVIS WITH CONTRAST  Technique:  Multidetector CT imaging of the chest, abdomen and pelvis was performed following the standard protocol during bolus administration of intravenous contrast.  Contrast: OMNIPAQUE IOHEXOL 300 MG/ML  SOLN  Comparison:  PET CT scan 03/26 1014  CT CHEST  Findings:  Right breast mastectomy anatomy.  There is thickening of the skin of the left breast to 11 mm.  There is a mass within the deep posterior left breast measuring 29 x 30 mm (image 6).  There are enlarged round abnormal lymph nodes in the left axilla measuring up to 20 mm short axis (image 22).  Lymph nodes extend beneath the sub pectoralis muscles on the left (image 12).  There is no evidence of internal mammary lymphadenopathy.  No infraclavicular adenopathy.  No mediastinal adenopathy.  No pericardial fluid.  No suspicious pulmonary nodules.  IMPRESSION:  1.  Left breast mass consistent with breast carcinoma. 2.  Skin thickening over the left breast is concerning for inflammatory breast cancer. 3.  Nodal metastasis in the left axilla and sub pectoralis nodal stations. 4.  No evidence of central nodal metastasis or mediastinal metastasis  CT ABDOMEN AND PELVIS  Findings:  No focal hepatic lesion.  The  gallbladder, pancreas, spleen, adrenal gland is, and kidneys are normal.  The stomach, small bowel, appendix, cecum are normal.  The colon and rectosigmoid colon are normal.  Abdominal aorta is normal caliber.  No retroperitoneal or periportal lymphadenopathy.  No mesenteric or peritoneal disease.  No free fluid in  the pelvis.  Post hysterectomy anatomy.  No pelvic lymphadenopathy. Review of  bone windows demonstrates no aggressive osseous lesions.  IMPRESSION:  No evidence of metastasis in the abdomen or pelvis.   Original Report Authenticated By: Genevive Bi, M.D.    Ct Abdomen Pelvis W Contrast  10/27/2012  *RADIOLOGY REPORT*  Clinical Data:  High risk breast cancer staging.  Right breast cancer diagnosed 1988.  Left breast cancer diagnosed February 2014.  CT CHEST, ABDOMEN AND PELVIS WITH CONTRAST  Technique:  Multidetector CT imaging of the chest, abdomen and pelvis was performed following the standard protocol during bolus administration of intravenous contrast.  Contrast: OMNIPAQUE IOHEXOL 300 MG/ML  SOLN  Comparison:  PET CT scan 03/26 1014  CT CHEST  Findings:  Right breast mastectomy anatomy.  There is thickening of the skin of the left breast to 11 mm.  There is a mass within the deep posterior left breast measuring 29 x 30 mm (image 6).  There are enlarged round abnormal lymph nodes in the left axilla measuring up to 20 mm short axis (image 22).  Lymph nodes extend beneath the sub pectoralis muscles on the left (image 12).  There is no evidence of internal mammary lymphadenopathy.  No infraclavicular adenopathy.  No mediastinal adenopathy.  No pericardial fluid.  No suspicious pulmonary nodules.  IMPRESSION:  1.  Left breast mass consistent with breast carcinoma. 2.  Skin thickening over the left breast is concerning for inflammatory breast cancer. 3.  Nodal metastasis in the left axilla and sub pectoralis nodal stations. 4.  No evidence of central nodal metastasis or mediastinal metastasis   CT ABDOMEN AND PELVIS  Findings:  No focal hepatic lesion.  The gallbladder, pancreas, spleen, adrenal gland is, and kidneys are normal.  The stomach, small bowel, appendix, cecum are normal.  The colon and rectosigmoid colon are normal.  Abdominal aorta is normal caliber.  No retroperitoneal or periportal lymphadenopathy.  No mesenteric or peritoneal disease.  No free fluid in  the pelvis.  Post hysterectomy anatomy.  No pelvic lymphadenopathy. Review of  bone windows demonstrates no aggressive osseous lesions.  IMPRESSION:  No evidence of metastasis in the abdomen or pelvis.   Original Report Authenticated By: Genevive Bi, M.D.    Mr Breast Bilateral W Wo Contrast  10/12/2012  *RADIOLOGY REPORT*  Clinical Data: History of previous malignant mastectomy of the right breast 25 years ago.  The patient developed diffuse skin thickening and firmness within the left breast.  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  BUN and creatinine were obtained on site at [facility] at [address] Results:  BUN 7 mg/dL,  Creatinine 1.0 mg/dL.  BILATERAL BREAST MRI WITH AND WITHOUT CONTRAST  Technique: Multiplanar, multisequence MR images of both breasts were obtained prior to and following the intravenous administration of 20ml of Multihance.  Three dimensional images were evaluated at the independent DynaCad workstation.  Comparison:  Mammograms dated 09/30/2012 and 09/28/2012.  Findings: There has been a previous right mastectomy.  There is marked diffuse enhancement involving the majority of the left breast.  This measures 13.0 x 12.5 x 9.5 cm in size.  This is associated with a mixture of plateau and washout enhancement kinetics.  In addition,  there is diffuse skin thickening and dermal  enhancement.  There are multiple enlarged level I left axillary lymph nodes.  The largest lymph  node measures 2.4 cm in size.  The large area of enhancement within the left breast extends posteriorly to abut the anterior portion of the pectoral muscle. There is no evidence for internal mammary adenopathy or right axillary adenopathy.  There are no additional findings.  IMPRESSION:  1.  Large ( 13 cm) area of enhancement involving the majority of the left breast consistent with the patient's known invasive ductal carcinoma and DCIS. This extends posteriorly to abut the pectoralis muscle. 2.  Multiple enlarged level I left axillary lymph nodes. 3.  Previous right mastectomy.  RECOMMENDATION: Treatment plan  THREE-DIMENSIONAL MR IMAGE RENDERING ON INDEPENDENT WORKSTATION:  Three-dimensional MR images were rendered by post-processing of the original MR data on an independent workstation.  The three- dimensional MR images were interpreted, and findings were reported in the accompanying complete MRI report for this study.  BI-RADS CATEGORY 6:  Known biopsy-proven malignancy - appropriate action should be taken.   Original Report Authenticated By: Rolla Plate, M.D.    Nm Pet Image Initial (pi) Skull Base To Thigh  10/27/2012  *RADIOLOGY REPORT*  Clinical Data: Initial treatment strategy for high risk of breast cancer. Triple negative  NUCLEAR MEDICINE PET SKULL BASE TO THIGH  Fasting Blood Glucose:  97  Technique:  17.9 mCi F-18 FDG was injected intravenously. CT data was obtained and used for attenuation correction and anatomic localization only.  (This was not acquired as a diagnostic CT examination.) Additional exam technical data entered on technologist worksheet.  Comparison:  Findings:  Neck: No hypermetabolic lymph nodes in the neck.  Chest:  There is intense metabolic activity ( SUV max = 40.9) associated with a large portion of the glandular tissue of the left breast consistent with carcinoma.  There are intensely hypermetabolic left axillary lymph nodes with SUV max = 15.1.  These nodes are rounded  and measure up to 2 cm. Hypermetabolic nodes extend to the sub pectoralis nodal station (image 71).  There is a single hypermetabolic internal mammary lymph node measuring 5 mm just left of the sternum (image 87).  This has intense metabolic activity for size with SUV max = 7.1.  No hypermetabolic mediastinal lymph nodes.  No hypermetabolic pulmonary nodules.  Abdomen/Pelvis:  No abnormal hypermetabolic activity within the liver, pancreas, adrenal glands, or spleen.  No hypermetabolic lymph nodes in the abdomen or pelvis.  Skeleton:  No focal hypermetabolic activity to suggest skeletal metastasis.  IMPRESSION:  1.  Hypermetabolic breast tissue on the left consistent with primary carcinoma. 2.  Hypermetabolic nodal metastasis in the left axilla extending to the sub pectoralis nodes. 3.  Single hypermetabolic left internal mammary lymph node.  4.  No evidence of pulmonary metastasis.  5.  No evidence of metastasis beneath hemidiaphragms or within the skeleton.   Original Report Authenticated By: Genevive Bi, M.D.    Dg Chest Port 1 View  10/28/2012  *RADIOLOGY REPORT*  Clinical Data: Port-A-Cath placement.  PORTABLE CHEST - 1 VIEW  Comparison: None.  Findings: The patient is rotated on the study.  Right side Port-A- Cath is in place.  Tubing appears intact.  Tip of the catheter projects in the mid to lower superior vena cava.  No pneumothorax identified.  Heart size is normal.  Lungs are clear.  The patient is status post right mastectomy and axillary dissection.  IMPRESSION: Tip of Port-A-Cath projects over the mid to lower superior vena  cava.  The patient is rotated on the study.  Exact position of the tip of the catheter could be determined with the lateral film. No pneumothorax.   Original Report Authenticated By: Holley Dexter, M.D.    Dg Fluoro Guide Cv Line-no Report  10/28/2012  CLINICAL DATA: [facility]   FLOURO GUIDE CV LINE  Fluoroscopy was utilized by the requesting physician.  No radiographic   interpretation.      ASSESSMENT:   57 year old female with  #1 history of breast cancer of the right breast status post mastectomy when she was 57 years old. Due to this she was referred for genetic testing.   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 Patient's tumor is ER negative PR negative HER-2/neu negative with a Ki-67 of 21%. Patient is being seen in medical oncology for neoadjuvant chemotherapy. Patient and I discussed her pathology in detail. We discussed treatment for breast cancer including surgical and with chemotherapy. Patient understands that she may still need a mastectomy however by giving her neoadjuvant chemotherapy her surgery may be made a little bit easier hopefully with giving her negative margins. She understands that neoadjuvant chemotherapy will also treat her distant disease.I discussed type of chemotherapy that she should have. This would include dose dense FEC x6 cycles followed by weekly Taxol carboplatinum for 12 weeks.  #3 Patient underwent staging studies with PET/CT, and received echo, port placement and chemotherapy class.    #4 Open port wound  PLAN:   1. Doing well.  Proceed with chemotherapy.  She will drink more fluids, and we will call her if she needs to take any more potassium.    2. Ms. Gallardo will return next week for labs and an appt.    3. She will f/u with Dr. Luisa Hart next Monday regarding her port, she was cleared by Dr. Johna Sheriff to use it.  We will continue to use a peripheral vein due to the proximity of the separation of the skin and the location of where the port would need to be accessed at.    All questions were answered. The patient knows to call the clinic with any problems, questions or concerns. We can certainly see the patient much sooner if necessary.  I spent 25 minutes counseling the patient face to face. The total time spent in  the appointment was 30 minutes.   Cherie Ouch Lyn Hollingshead, NP Medical Oncology [facility] Phone: [phone] 11/30/2012, 3:06 PM

## 2012-11-30 NOTE — Progress Notes (Signed)
Pt assessed by Denton Lank. Port incision continues to be open and not healed. She states she saw Dr.Hoxworth and he told her it was not infected but he did give her an antibiotic. Dr. Luisa Hart was on vacation and she has been scheduled to see him 12/06/12 to further evaluate. Pt was stuck 2 times by Windy Kalata. One attempt in left lateral wrist and left inner wrist. Dixie Smith,RN stuck pt 2 times. One attempt in left upper medial forearm and left upper inner forearm.Pt tolerated attempts well. Labs were not obtained either.  Pt was discharged home. Dr. Park Breed will be notified and her treatment rescheduled when her port is revised.

## 2012-11-30 NOTE — Patient Instructions (Signed)
Doing well.  Proceed with chemotherapy.  Please call us if you have any questions or concerns.    

## 2012-11-30 NOTE — Progress Notes (Signed)
Mailed after appt letter to pt. 

## 2012-12-01 ENCOUNTER — Ambulatory Visit: Payer: BC Managed Care – PPO

## 2012-12-06 ENCOUNTER — Encounter (INDEPENDENT_AMBULATORY_CARE_PROVIDER_SITE_OTHER): Payer: Self-pay | Admitting: Surgery

## 2012-12-06 ENCOUNTER — Ambulatory Visit (INDEPENDENT_AMBULATORY_CARE_PROVIDER_SITE_OTHER): Payer: BC Managed Care – PPO | Admitting: Surgery

## 2012-12-06 VITALS — BP 140/86 | HR 88 | Resp 18 | Ht 64.0 in | Wt 214.6 lb

## 2012-12-06 DIAGNOSIS — Z9889 Other specified postprocedural states: Secondary | ICD-10-CM

## 2012-12-06 NOTE — Progress Notes (Signed)
Patient returns for followup of for Port-A-Cath placement. She has a skin separation is placed on doxycycline a couple weeks ago. She denies any fever or chills. She received 1 treatment through peripheral IV. They have held her treatments until port site heals. She feels well.  Exam: Port site with minimal skin separation. Nontender. No redness or fluctuance.  Impression: Slight skin separation at port site insertion with no infection or exposed port  Plan: Continue local wound care. Hopefully he can use next week.

## 2012-12-06 NOTE — Patient Instructions (Signed)
Return 2 weeks.  Keep covered.  Cont neosporin

## 2012-12-07 ENCOUNTER — Other Ambulatory Visit: Payer: BC Managed Care – PPO | Admitting: Lab

## 2012-12-07 ENCOUNTER — Ambulatory Visit: Payer: BC Managed Care – PPO | Admitting: Adult Health

## 2012-12-07 ENCOUNTER — Telehealth (INDEPENDENT_AMBULATORY_CARE_PROVIDER_SITE_OTHER): Payer: Self-pay

## 2012-12-07 NOTE — Telephone Encounter (Signed)
Left appt info on VM 

## 2012-12-14 ENCOUNTER — Other Ambulatory Visit (HOSPITAL_BASED_OUTPATIENT_CLINIC_OR_DEPARTMENT_OTHER): Payer: BC Managed Care – PPO | Admitting: Lab

## 2012-12-14 ENCOUNTER — Ambulatory Visit (HOSPITAL_BASED_OUTPATIENT_CLINIC_OR_DEPARTMENT_OTHER): Payer: BC Managed Care – PPO | Admitting: Adult Health

## 2012-12-14 ENCOUNTER — Encounter: Payer: Self-pay | Admitting: Adult Health

## 2012-12-14 ENCOUNTER — Ambulatory Visit (HOSPITAL_BASED_OUTPATIENT_CLINIC_OR_DEPARTMENT_OTHER): Payer: BC Managed Care – PPO

## 2012-12-14 VITALS — BP 139/85 | HR 97 | Temp 98.4°F | Resp 20 | Ht 64.0 in | Wt 213.6 lb

## 2012-12-14 DIAGNOSIS — C50912 Malignant neoplasm of unspecified site of left female breast: Secondary | ICD-10-CM

## 2012-12-14 DIAGNOSIS — Z5111 Encounter for antineoplastic chemotherapy: Secondary | ICD-10-CM

## 2012-12-14 DIAGNOSIS — C50919 Malignant neoplasm of unspecified site of unspecified female breast: Secondary | ICD-10-CM

## 2012-12-14 DIAGNOSIS — C773 Secondary and unspecified malignant neoplasm of axilla and upper limb lymph nodes: Secondary | ICD-10-CM

## 2012-12-14 DIAGNOSIS — Z853 Personal history of malignant neoplasm of breast: Secondary | ICD-10-CM

## 2012-12-14 DIAGNOSIS — E876 Hypokalemia: Secondary | ICD-10-CM

## 2012-12-14 LAB — CBC WITH DIFFERENTIAL/PLATELET
Basophils Absolute: 0 10*3/uL (ref 0.0–0.1)
Eosinophils Absolute: 0.1 10*3/uL (ref 0.0–0.5)
HCT: 33.2 % — ABNORMAL LOW (ref 34.8–46.6)
HGB: 10.9 g/dL — ABNORMAL LOW (ref 11.6–15.9)
MONO#: 1.4 10*3/uL — ABNORMAL HIGH (ref 0.1–0.9)
NEUT#: 6.3 10*3/uL (ref 1.5–6.5)
NEUT%: 62.6 % (ref 38.4–76.8)
WBC: 10.1 10*3/uL (ref 3.9–10.3)
lymph#: 2.3 10*3/uL (ref 0.9–3.3)

## 2012-12-14 LAB — COMPREHENSIVE METABOLIC PANEL (CC13)
Albumin: 3.7 g/dL (ref 3.5–5.0)
BUN: 9.6 mg/dL (ref 7.0–26.0)
Calcium: 9.6 mg/dL (ref 8.4–10.4)
Chloride: 103 mEq/L (ref 98–107)
Glucose: 131 mg/dl — ABNORMAL HIGH (ref 70–99)
Potassium: 2.9 mEq/L — CL (ref 3.5–5.1)
Total Protein: 7.3 g/dL (ref 6.4–8.3)

## 2012-12-14 MED ORDER — FLUOROURACIL CHEMO INJECTION 2.5 GM/50ML
500.0000 mg/m2 | Freq: Once | INTRAVENOUS | Status: AC
  Administered 2012-12-14: 1050 mg via INTRAVENOUS
  Filled 2012-12-14: qty 21

## 2012-12-14 MED ORDER — DEXAMETHASONE SODIUM PHOSPHATE 20 MG/5ML IJ SOLN
12.0000 mg | Freq: Once | INTRAMUSCULAR | Status: AC
  Administered 2012-12-14: 15:00:00 via INTRAVENOUS

## 2012-12-14 MED ORDER — PALONOSETRON HCL INJECTION 0.25 MG/5ML
0.2500 mg | Freq: Once | INTRAVENOUS | Status: AC
  Administered 2012-12-14: 0.25 mg via INTRAVENOUS

## 2012-12-14 MED ORDER — SODIUM CHLORIDE 0.9 % IJ SOLN
10.0000 mL | INTRAMUSCULAR | Status: DC | PRN
  Administered 2012-12-14: 10 mL
  Filled 2012-12-14: qty 10

## 2012-12-14 MED ORDER — HEPARIN SOD (PORK) LOCK FLUSH 100 UNIT/ML IV SOLN
500.0000 [IU] | Freq: Once | INTRAVENOUS | Status: AC | PRN
  Administered 2012-12-14: 500 [IU]
  Filled 2012-12-14: qty 5

## 2012-12-14 MED ORDER — SODIUM CHLORIDE 0.9 % IV SOLN
Freq: Once | INTRAVENOUS | Status: AC
  Administered 2012-12-14: 15:00:00 via INTRAVENOUS

## 2012-12-14 MED ORDER — SODIUM CHLORIDE 0.9 % IV SOLN
500.0000 mg/m2 | Freq: Once | INTRAVENOUS | Status: AC
  Administered 2012-12-14: 1080 mg via INTRAVENOUS
  Filled 2012-12-14: qty 54

## 2012-12-14 MED ORDER — EPIRUBICIN HCL CHEMO IV INJECTION 200 MG/100ML
100.0000 mg/m2 | Freq: Once | INTRAVENOUS | Status: AC
  Administered 2012-12-14: 214 mg via INTRAVENOUS
  Filled 2012-12-14: qty 107

## 2012-12-14 MED ORDER — SODIUM CHLORIDE 0.9 % IV SOLN
150.0000 mg | Freq: Once | INTRAVENOUS | Status: AC
  Administered 2012-12-14: 150 mg via INTRAVENOUS
  Filled 2012-12-14: qty 5

## 2012-12-14 MED ORDER — POTASSIUM CHLORIDE CRYS ER 20 MEQ PO TBCR: 20.0000 meq | EXTENDED_RELEASE_TABLET | Freq: Two times a day (BID) | ORAL | Status: DC

## 2012-12-14 NOTE — Progress Notes (Signed)
OFFICE PROGRESS NOTE  CC**  Christine Mew, MD [address] 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   PRIOR THERAPY:  1. Patient has history of right breast cancer patient underwent a mastectomy when she was 57 years old. This occurred during her pregnancy. After that patient did not take any kind of further treatments. She continued to do well.   2.Recently she noted changes in her nipple had become itchy and retracted. Because of this she underwent mammogram with ultrasound that showed 2 areas in the left breast one at 12:00 position and a second at 3:00 position. She also had an ultrasound performed that showed the diameter to be at least 6 cm and multiple left axillary lymph nodes that were abnormal. She then went on to have a biopsy performed on 09/30/2012. 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 Patient went on to have MRIs of the breasts performed and was noted to be marked diffuse enhancement involving majority of the left breast measuring 13.0 x 12.5 x 9.5 cm. There was diffuse skin thickening and dermal enhancement. Multiple enlarged level I left axillary lymph nodes. The largest lymph node measured 2.4 cm in size. The large area of enhancement within the left breast extends posteriorly to abut the anterior portion of the pectoralis muscle. There was no evidence of internal mammary adenopathy or right axillary adenopathy.  PET/CT did show the left breast mass, and left axillary and subpectoralis nodal metastases, but no further areas throughout the chest/abd/pelvis of metastases.   3.  Patient currently undergoing  neoadjuvant chemotherapy with FEC x 6 cycles starting on 11/02/12, which will be followed by 12 cycles of weekly Taxol/Carbo.  CURRENT THERAPY:FEC cycle 3 day 1  INTERVAL HISTORY: Christine [name] 57 y.o. female returns for follow up today.  She is doing well today.  She continues to follow up with Dr. Luisa Hart about her port and the skin separation.  He has cleared her to restart chemo.  She denies any fevers, chills, nausea, vomiting, constipation, numbness, or exudate/swelling or pain at the port site.   MEDICAL HISTORY: Past Medical History  Diagnosis Date  . ALLERGIC RHINITIS   . GERD (gastroesophageal reflux disease)   . Hypertension   . Hypothyroidism   . Hx of colonic polyps   . Wears glasses   . Breast cancer 59, age 38    right  . Breast cancer 09/30/12    left, ER/PR -, Her 2 -    ALLERGIES:  is allergic to sulfa antibiotics and sulfonamide derivatives.  MEDICATIONS:  Current Outpatient Prescriptions  Medication Sig Dispense Refill  . Alum & Mag Hydroxide-Simeth (MAGIC MOUTHWASH) SOLN Take 5 mLs by mouth 4 (four) times daily.  280 mL  0  . dexamethasone (DECADRON) 4 MG tablet Take 2 tablets by mouth once a day on the day after chemotherapy and then take 2 tablets two times a day for 2 days. Take with food.  30 tablet  1  . diltiazem (CARDIZEM CD) 240 MG 24 hr capsule Take 1 capsule (240 mg total) by mouth daily.  90 capsule  2  . doxycycline (VIBRA-TABS) 100 MG tablet Take 1 tablet (  100 mg total) by mouth 2 (two) times daily.  28 tablet  2  . fluconazole (DIFLUCAN) 200 MG tablet Take 1 tablet by mouth every other day.      . hydrochlorothiazide (MICROZIDE) 12.5 MG capsule TAKE 1 CAPSULE (12.5 MG TOTAL) BY MOUTH EVERY MORNING.  90 capsule  2  . latanoprost (XALATAN) 0.005 % ophthalmic solution Place 1 drop into both eyes at bedtime.       Marland Kitchen levothyroxine (SYNTHROID, LEVOTHROID) 50 MCG tablet TAKE 1 TABLET (50 MCG TOTAL) BY MOUTH DAILY.  90 tablet  2  . lidocaine-prilocaine  (EMLA) cream Apply topically as needed.  30 g  6  . LORazepam (ATIVAN) 0.5 MG tablet Take 1 tablet (0.5 mg total) by mouth every 6 (six) hours as needed (Nausea or vomiting).  30 tablet  0  . NEXIUM 40 MG capsule TAKE 1 CAPSULE (40 MG TOTAL) BY MOUTH DAILY BEFORE BREAKFAST.  90 capsule  1  . ondansetron (ZOFRAN) 8 MG tablet Take 1 tablet (8 mg total) by mouth 2 (two) times daily as needed. Take two times a day as needed for nausea or vomiting starting on the third day after chemotherapy.  30 tablet  1  . PATADAY 0.2 % SOLN       . potassium chloride SA (KLOR-CON M20) 20 MEQ tablet Take 1 tablet (20 mEq total) by mouth 2 (two) times daily. For 7 days  14 tablet  0  . prochlorperazine (COMPAZINE) 10 MG tablet Take 1 tablet (10 mg total) by mouth every 6 (six) hours as needed (Nausea or vomiting).  30 tablet  1  . prochlorperazine (COMPAZINE) 25 MG suppository Place 1 suppository (25 mg total) rectally every 12 (twelve) hours as needed for nausea.  12 suppository  3   No current facility-administered medications for this visit.    SURGICAL HISTORY:  Past Surgical History  Procedure Laterality Date  . Tonsillectomy    . Caesarean section      x 1  . Mastectomy  04/1987    right side  . Abdominal hysterectomy  06/1988    fibroids  . Portacath placement Right 10/28/2012    Procedure: PORT PLACEMENT;  Surgeon: Clovis Pu. Cornett, MD;  Location: [facility];  Service: General;  Laterality: Right;  Right Subclavian Vein    REVIEW OF SYSTEMS:  General: fatigue (+), night sweats (-), fever (-), pain (-) Lymph: palpable nodes (-) HEENT: vision changes (-), mucositis (-), gum bleeding (-), epistaxis (-) Cardiovascular: chest pain (-), palpitations (-) Pulmonary: shortness of breath (-), dyspnea on exertion (-), cough (-), hemoptysis (-) GI:  Early satiety (-), melena (-), dysphagia (-), nausea/vomiting (-), diarrhea (-) GU: dysuria (-), hematuria (-), incontinence (-) Musculoskeletal:  joint swelling (-), joint pain (-), back pain (-) Neuro: weakness (-), numbness (-), headache (-), confusion (-) Skin: Rash (-), lesions (+), dryness (-) Psych: depression (-), suicidal/homicidal ideation (-), feeling of hopelessness (-)   PHYSICAL EXAMINATION: Blood pressure 139/85, pulse 97, temperature 98.4 F (36.9 C), temperature source Oral, resp. rate 20, height 5\' 4"  (1.626 m), weight 213 lb 9.6 oz (96.888 kg). Body mass index is 36.65 kg/(m^2). General: Patient is a well appearing female in no acute distress HEENT: PERRLA, sclerae anicteric no conjunctival pallor, MMM. Small red lesions on tip of tongue and left buccal mucosa. Neck: supple, no palpable adenopathy Lungs: clear to auscultation bilaterally, no wheezes, rhonchi, or rales Cardiovascular: regular rate rhythm, S1, S2, no murmurs, rubs or gallops Abdomen:  Soft, non-tender, non-distended, normoactive bowel sounds, no HSM Extremities: warm and well perfused, no clubbing, cyanosis, or edema Skin: No rashes or lesions Neuro: Non-focal Breasts: Right mastectomy site without nodularity or sign of recurrence.  Left breast mass palpable along with dimpling of skin and inversion of the nipple--softer.  Right chest wall port with open incision site.  Yellow tissue at wound bed, no erythema, tenderness, warmth, or exudate.  ECOG PERFORMANCE STATUS: 1 - Symptomatic but completely ambulatory  LABORATORY DATA: Lab Results  Component Value Date   WBC 10.1 12/14/2012   HGB 10.9* 12/14/2012   HCT 33.2* 12/14/2012   MCV 93.3 12/14/2012   PLT 261 12/14/2012      Chemistry      Component Value Date/Time   NA 143 12/14/2012 1402   NA 144 10/28/2012 1248   K 2.9 Repeated and Verified* 12/14/2012 1402   K 3.9 10/28/2012 1248   CL 103 12/14/2012 1402   CL 108 10/28/2012 1248   CO2 28 12/14/2012 1402   CO2 29 09/13/2011 1910   BUN 9.6 12/14/2012 1402   BUN 10 10/28/2012 1248   CREATININE 0.8 12/14/2012 1402   CREATININE 0.80 10/28/2012 1248       Component Value Date/Time   CALCIUM 9.6 12/14/2012 1402   CALCIUM 9.4 09/13/2011 1910   ALKPHOS 88 12/14/2012 1402   ALKPHOS 78 09/13/2011 1910   AST 24 12/14/2012 1402   AST 18 09/13/2011 1910   ALT 21 12/14/2012 1402   ALT 12 09/13/2011 1910   BILITOT 0.33 12/14/2012 1402   BILITOT 0.3 09/13/2011 1910       RADIOGRAPHIC STUDIES:  Ct Chest W Contrast  10/27/2012  *RADIOLOGY REPORT*  Clinical Data:  High risk breast cancer staging.  Right breast cancer diagnosed 1988.  Left breast cancer diagnosed February 2014.  CT CHEST, ABDOMEN AND PELVIS WITH CONTRAST  Technique:  Multidetector CT imaging of the chest, abdomen and pelvis was performed following the standard protocol during bolus administration of intravenous contrast.  Contrast: OMNIPAQUE IOHEXOL 300 MG/ML  SOLN  Comparison:  PET CT scan 03/26 1014  CT CHEST  Findings:  Right breast mastectomy anatomy.  There is thickening of the skin of the left breast to 11 mm.  There is a mass within the deep posterior left breast measuring 29 x 30 mm (image 6).  There are enlarged round abnormal lymph nodes in the left axilla measuring up to 20 mm short axis (image 22).  Lymph nodes extend beneath the sub pectoralis muscles on the left (image 12).  There is no evidence of internal mammary lymphadenopathy.  No infraclavicular adenopathy.  No mediastinal adenopathy.  No pericardial fluid.  No suspicious pulmonary nodules.  IMPRESSION:  1.  Left breast mass consistent with breast carcinoma. 2.  Skin thickening over the left breast is concerning for inflammatory breast cancer. 3.  Nodal metastasis in the left axilla and sub pectoralis nodal stations. 4.  No evidence of central nodal metastasis or mediastinal metastasis  CT ABDOMEN AND PELVIS  Findings:  No focal hepatic lesion.  The gallbladder, pancreas, spleen, adrenal gland is, and kidneys are normal.  The stomach, small bowel, appendix, cecum are normal.  The colon and rectosigmoid colon are normal.  Abdominal  aorta is normal caliber.  No retroperitoneal or periportal lymphadenopathy.  No mesenteric or peritoneal disease.  No free fluid in  the pelvis.  Post hysterectomy anatomy.  No pelvic lymphadenopathy. Review of  bone windows demonstrates no aggressive osseous  lesions.  IMPRESSION:  No evidence of metastasis in the abdomen or pelvis.   Original Report Authenticated By: Genevive Bi, M.D.    Ct Abdomen Pelvis W Contrast  10/27/2012  *RADIOLOGY REPORT*  Clinical Data:  High risk breast cancer staging.  Right breast cancer diagnosed 1988.  Left breast cancer diagnosed February 2014.  CT CHEST, ABDOMEN AND PELVIS WITH CONTRAST  Technique:  Multidetector CT imaging of the chest, abdomen and pelvis was performed following the standard protocol during bolus administration of intravenous contrast.  Contrast: OMNIPAQUE IOHEXOL 300 MG/ML  SOLN  Comparison:  PET CT scan 03/26 1014  CT CHEST  Findings:  Right breast mastectomy anatomy.  There is thickening of the skin of the left breast to 11 mm.  There is a mass within the deep posterior left breast measuring 29 x 30 mm (image 6).  There are enlarged round abnormal lymph nodes in the left axilla measuring up to 20 mm short axis (image 22).  Lymph nodes extend beneath the sub pectoralis muscles on the left (image 12).  There is no evidence of internal mammary lymphadenopathy.  No infraclavicular adenopathy.  No mediastinal adenopathy.  No pericardial fluid.  No suspicious pulmonary nodules.  IMPRESSION:  1.  Left breast mass consistent with breast carcinoma. 2.  Skin thickening over the left breast is concerning for inflammatory breast cancer. 3.  Nodal metastasis in the left axilla and sub pectoralis nodal stations. 4.  No evidence of central nodal metastasis or mediastinal metastasis  CT ABDOMEN AND PELVIS  Findings:  No focal hepatic lesion.  The gallbladder, pancreas, spleen, adrenal gland is, and kidneys are normal.  The stomach, small bowel, appendix, cecum are  normal.  The colon and rectosigmoid colon are normal.  Abdominal aorta is normal caliber.  No retroperitoneal or periportal lymphadenopathy.  No mesenteric or peritoneal disease.  No free fluid in  the pelvis.  Post hysterectomy anatomy.  No pelvic lymphadenopathy. Review of  bone windows demonstrates no aggressive osseous lesions.  IMPRESSION:  No evidence of metastasis in the abdomen or pelvis.   Original Report Authenticated By: Genevive Bi, M.D.    Mr Breast Bilateral W Wo Contrast  10/12/2012  *RADIOLOGY REPORT*  Clinical Data: History of previous malignant mastectomy of the right breast 25 years ago.  The patient developed diffuse skin thickening and firmness within the left breast.  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  BUN and creatinine were obtained on site at [facility] at [address] Results:  BUN 7 mg/dL,  Creatinine 1.0 mg/dL.  BILATERAL BREAST MRI WITH AND WITHOUT CONTRAST  Technique: Multiplanar, multisequence MR images of both breasts were obtained prior to and following the intravenous administration of 20ml of Multihance.  Three dimensional images were evaluated at the independent DynaCad workstation.  Comparison:  Mammograms dated 09/30/2012 and 09/28/2012.  Findings: There has been a previous right mastectomy.  There is marked diffuse enhancement involving the majority of the left breast.  This measures 13.0 x 12.5 x 9.5 cm in size.  This is associated with a mixture of plateau and washout enhancement kinetics.  In addition,  there is diffuse skin thickening and dermal enhancement.  There are multiple enlarged level I left axillary lymph nodes.  The largest lymph node measures 2.4 cm in size.  The large area of enhancement within the left breast extends posteriorly to abut  the anterior portion of the pectoral muscle. There is no evidence for  internal mammary adenopathy or right axillary adenopathy.  There are no additional findings.  IMPRESSION:  1.  Large ( 13 cm) area of enhancement involving the majority of the left breast consistent with the patient's known invasive ductal carcinoma and DCIS. This extends posteriorly to abut the pectoralis muscle. 2.  Multiple enlarged level I left axillary lymph nodes. 3.  Previous right mastectomy.  RECOMMENDATION: Treatment plan  THREE-DIMENSIONAL MR IMAGE RENDERING ON INDEPENDENT WORKSTATION:  Three-dimensional MR images were rendered by post-processing of the original MR data on an independent workstation.  The three- dimensional MR images were interpreted, and findings were reported in the accompanying complete MRI report for this study.  BI-RADS CATEGORY 6:  Known biopsy-proven malignancy - appropriate action should be taken.   Original Report Authenticated By: Rolla Plate, M.D.    Nm Pet Image Initial (pi) Skull Base To Thigh  10/27/2012  *RADIOLOGY REPORT*  Clinical Data: Initial treatment strategy for high risk of breast cancer. Triple negative  NUCLEAR MEDICINE PET SKULL BASE TO THIGH  Fasting Blood Glucose:  97  Technique:  17.9 mCi F-18 FDG was injected intravenously. CT data was obtained and used for attenuation correction and anatomic localization only.  (This was not acquired as a diagnostic CT examination.) Additional exam technical data entered on technologist worksheet.  Comparison:  Findings:  Neck: No hypermetabolic lymph nodes in the neck.  Chest:  There is intense metabolic activity ( SUV max = 16.1) associated with a large portion of the glandular tissue of the left breast consistent with carcinoma.  There are intensely hypermetabolic left axillary lymph nodes with SUV max = 15.1.  These nodes are rounded and measure up to 2 cm. Hypermetabolic nodes extend to the sub pectoralis nodal station (image 71).  There is a single hypermetabolic internal mammary lymph node measuring 5 mm just  left of the sternum (image 87).  This has intense metabolic activity for size with SUV max = 7.1.  No hypermetabolic mediastinal lymph nodes.  No hypermetabolic pulmonary nodules.  Abdomen/Pelvis:  No abnormal hypermetabolic activity within the liver, pancreas, adrenal glands, or spleen.  No hypermetabolic lymph nodes in the abdomen or pelvis.  Skeleton:  No focal hypermetabolic activity to suggest skeletal metastasis.  IMPRESSION:  1.  Hypermetabolic breast tissue on the left consistent with primary carcinoma. 2.  Hypermetabolic nodal metastasis in the left axilla extending to the sub pectoralis nodes. 3.  Single hypermetabolic left internal mammary lymph node.  4.  No evidence of pulmonary metastasis.  5.  No evidence of metastasis beneath hemidiaphragms or within the skeleton.   Original Report Authenticated By: Genevive Bi, M.D.    Dg Chest Port 1 View  10/28/2012  *RADIOLOGY REPORT*  Clinical Data: Port-A-Cath placement.  PORTABLE CHEST - 1 VIEW  Comparison: None.  Findings: The patient is rotated on the study.  Right side Port-A- Cath is in place.  Tubing appears intact.  Tip of the catheter projects in the mid to lower superior vena cava.  No pneumothorax identified.  Heart size is normal.  Lungs are clear.  The patient is status post right mastectomy and axillary dissection.  IMPRESSION: Tip of Port-A-Cath projects over the mid to lower superior vena cava.  The patient is rotated on the study.  Exact position of the tip of the catheter could be determined with the lateral film. No pneumothorax.   Original Report Authenticated By: Holley Dexter, M.D.  Dg Fluoro Guide Cv Line-no Report  10/28/2012  CLINICAL DATA: [facility]   FLOURO GUIDE CV LINE  Fluoroscopy was utilized by the requesting physician.  No radiographic  interpretation.      ASSESSMENT:   57 year old female with  #1 history of breast cancer of the right breast status post mastectomy when she was 57 years old. Due to this she  was referred for genetic testing.   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 Patient's tumor is ER negative PR negative HER-2/neu negative with a Ki-67 of 21%. Patient is being seen in medical oncology for neoadjuvant chemotherapy. Patient and I discussed her pathology in detail. We discussed treatment for breast cancer including surgical and with chemotherapy. Patient understands that she may still need a mastectomy however by giving her neoadjuvant chemotherapy her surgery may be made a little bit easier hopefully with giving her negative margins. She understands that neoadjuvant chemotherapy will also treat her distant disease.I discussed type of chemotherapy that she should have. This would include dose dense FEC x6 cycles followed by weekly Taxol carboplatinum for 12 weeks.  #3 Patient underwent staging studies with PET/CT, and received echo, port placement and chemotherapy class.    #4 Open port wound  #5 Hypokalemia  PLAN:   1. Doing well.  Proceed with chemotherapy.  She will receive this through the port and continue to follow up with Dr. Luisa Hart for close surveillance.  2. Ms. Cimmino will return next week for labs and an appt.    3. We prescribed potassium for her hypokalemia.    All questions were answered. The patient knows to call the clinic with any problems, questions or concerns. We can certainly see the patient much sooner if necessary.  I spent 25 minutes counseling the patient face to face. The total time spent in the appointment was 30 minutes.   Cherie Ouch Lyn Hollingshead, NP Medical Oncology [facility] Phone: [phone] 12/15/2012, 8:56 AM

## 2012-12-14 NOTE — Patient Instructions (Signed)
[facility] Discharge Instructions for Patients Receiving Chemotherapy  Today you received the following chemotherapy agents  5FU, ELLENCE and CYTOXAN To help prevent nausea and vomiting after your treatment, we encourage you to take your nausea medication   Take it as often as prescribed.   If you develop nausea and vomiting that is not controlled by your nausea medication, call the clinic. If it is after clinic hours your family physician or the after hours number for the clinic or go to the Emergency Department.   BELOW ARE SYMPTOMS THAT SHOULD BE REPORTED IMMEDIATELY:  *FEVER GREATER THAN 100.5 F  *CHILLS WITH OR WITHOUT FEVER  NAUSEA AND VOMITING THAT IS NOT CONTROLLED WITH YOUR NAUSEA MEDICATION  *UNUSUAL SHORTNESS OF BREATH  *UNUSUAL BRUISING OR BLEEDING  TENDERNESS IN MOUTH AND THROAT WITH OR WITHOUT PRESENCE OF ULCERS  *URINARY PROBLEMS  *BOWEL PROBLEMS  UNUSUAL RASH Items with * indicate a potential emergency and should be followed up as soon as possible.  If this is your first treatment one of the nurses will contact you 24 hours after your treatment. Please let the nurse know about any problems that you may have experienced. Feel free to call the clinic you have any questions or concerns. The clinic phone number is [phone].   I have been informed and understand all the instructions given to me. I know to contact the clinic, my physician, or go to the Emergency Department if any problems should occur. I do not have any questions at this time, but understand that I may call the clinic during office hours   should I have any questions or need assistance in obtaining follow up care.    __________________________________________  _____________  __________ Signature of Patient or Authorized Representative            Date                   Time    __________________________________________ Nurse's Signature

## 2012-12-14 NOTE — Patient Instructions (Signed)
Doing well.  Proceed with chemotherapy.  Please call us if you have any questions or concerns.    

## 2012-12-15 ENCOUNTER — Ambulatory Visit (HOSPITAL_BASED_OUTPATIENT_CLINIC_OR_DEPARTMENT_OTHER): Payer: BC Managed Care – PPO

## 2012-12-15 VITALS — BP 121/62 | HR 103 | Temp 98.3°F

## 2012-12-15 DIAGNOSIS — Z5189 Encounter for other specified aftercare: Secondary | ICD-10-CM

## 2012-12-15 DIAGNOSIS — C773 Secondary and unspecified malignant neoplasm of axilla and upper limb lymph nodes: Secondary | ICD-10-CM

## 2012-12-15 DIAGNOSIS — C50912 Malignant neoplasm of unspecified site of left female breast: Secondary | ICD-10-CM

## 2012-12-15 DIAGNOSIS — C50919 Malignant neoplasm of unspecified site of unspecified female breast: Secondary | ICD-10-CM

## 2012-12-15 MED ORDER — PEGFILGRASTIM INJECTION 6 MG/0.6ML
6.0000 mg | Freq: Once | SUBCUTANEOUS | Status: AC
  Administered 2012-12-15: 6 mg via SUBCUTANEOUS
  Filled 2012-12-15: qty 0.6

## 2012-12-16 ENCOUNTER — Encounter: Payer: Self-pay | Admitting: Internal Medicine

## 2012-12-16 ENCOUNTER — Other Ambulatory Visit: Payer: Self-pay | Admitting: *Deleted

## 2012-12-16 ENCOUNTER — Encounter: Payer: Self-pay | Admitting: *Deleted

## 2012-12-20 ENCOUNTER — Encounter (INDEPENDENT_AMBULATORY_CARE_PROVIDER_SITE_OTHER): Payer: Self-pay | Admitting: Surgery

## 2012-12-20 ENCOUNTER — Ambulatory Visit (INDEPENDENT_AMBULATORY_CARE_PROVIDER_SITE_OTHER): Payer: BC Managed Care – PPO | Admitting: Surgery

## 2012-12-20 VITALS — BP 132/84 | HR 80 | Temp 97.7°F | Resp 16 | Ht 64.0 in | Wt 214.2 lb

## 2012-12-20 DIAGNOSIS — C50919 Malignant neoplasm of unspecified site of unspecified female breast: Secondary | ICD-10-CM

## 2012-12-20 DIAGNOSIS — C50912 Malignant neoplasm of unspecified site of left female breast: Secondary | ICD-10-CM

## 2012-12-20 NOTE — Patient Instructions (Signed)
Return 2 months

## 2012-12-20 NOTE — Progress Notes (Signed)
Patient returns for followup of for Port-A-Cath placement. She has a skin separation is placed on doxycycline a couple weeks ago. She denies any fever or chills. She received 1 treatment through peripheral IV. They have held her treatments until port site heals. She feels well.  Exam: Port site with minimal skin separation. Nontender. No redness or fluctuance.  Impression: Slight skin separation at port site insertion with no infection or exposed port  Plan: Continue local wound care. Port has been used with success. Return 2 months or so.  She will probably need a mastectomy but may shrink enough to do lumpectomy.  Had a right mastectomy already.

## 2012-12-21 ENCOUNTER — Ambulatory Visit (HOSPITAL_BASED_OUTPATIENT_CLINIC_OR_DEPARTMENT_OTHER): Payer: BC Managed Care – PPO | Admitting: Adult Health

## 2012-12-21 ENCOUNTER — Other Ambulatory Visit (HOSPITAL_BASED_OUTPATIENT_CLINIC_OR_DEPARTMENT_OTHER): Payer: BC Managed Care – PPO | Admitting: Lab

## 2012-12-21 VITALS — BP 116/75 | HR 99 | Temp 98.3°F | Resp 20 | Ht 64.0 in | Wt 217.3 lb

## 2012-12-21 DIAGNOSIS — Z171 Estrogen receptor negative status [ER-]: Secondary | ICD-10-CM

## 2012-12-21 DIAGNOSIS — C50919 Malignant neoplasm of unspecified site of unspecified female breast: Secondary | ICD-10-CM

## 2012-12-21 DIAGNOSIS — C50912 Malignant neoplasm of unspecified site of left female breast: Secondary | ICD-10-CM

## 2012-12-21 DIAGNOSIS — C773 Secondary and unspecified malignant neoplasm of axilla and upper limb lymph nodes: Secondary | ICD-10-CM

## 2012-12-21 DIAGNOSIS — Z853 Personal history of malignant neoplasm of breast: Secondary | ICD-10-CM

## 2012-12-21 LAB — COMPREHENSIVE METABOLIC PANEL (CC13)
ALT: 14 U/L (ref 0–55)
AST: 16 U/L (ref 5–34)
Albumin: 3.3 g/dL — ABNORMAL LOW (ref 3.5–5.0)
Alkaline Phosphatase: 97 U/L (ref 40–150)
BUN: 12.9 mg/dL (ref 7.0–26.0)
Calcium: 8.9 mg/dL (ref 8.4–10.4)
Chloride: 101 mEq/L (ref 98–107)
Potassium: 3.7 mEq/L (ref 3.5–5.1)
Sodium: 136 mEq/L (ref 136–145)

## 2012-12-21 LAB — CBC WITH DIFFERENTIAL/PLATELET
BASO%: 0.2 % (ref 0.0–2.0)
EOS%: 3 % (ref 0.0–7.0)
HGB: 11 g/dL — ABNORMAL LOW (ref 11.6–15.9)
MCH: 31.4 pg (ref 25.1–34.0)
MCHC: 34.2 g/dL (ref 31.5–36.0)
MCV: 91.6 fL (ref 79.5–101.0)
MONO%: 1.3 % (ref 0.0–14.0)
RBC: 3.51 10*6/uL — ABNORMAL LOW (ref 3.70–5.45)
RDW: 14.8 % — ABNORMAL HIGH (ref 11.2–14.5)
lymph#: 1 10*3/uL (ref 0.9–3.3)

## 2012-12-21 NOTE — Patient Instructions (Addendum)
Doing well.  Labs are stable.  We will see you back next week.  Please call us if you have any questions or concerns.

## 2012-12-21 NOTE — Progress Notes (Signed)
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   PRIOR THERAPY:  1. Patient has history of right breast cancer patient underwent a mastectomy when she was 57 years old. This occurred during her pregnancy. After that patient did not take any kind of further treatments. She continued to do well.   2.Recently she noted changes in her nipple had become itchy and retracted. Because of this she underwent mammogram with ultrasound that showed 2 areas in the left breast one at 12:00 position and a second at 3:00 position. She also had an ultrasound performed that showed the diameter to be at least 6 cm and multiple left axillary lymph nodes that were abnormal. She then went on to have a biopsy performed on 09/30/2012. 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 Patient went on to have MRIs of the breasts performed and was noted to be marked diffuse enhancement involving majority of the left breast measuring 13.0 x 12.5 x 9.5 cm. There was diffuse skin thickening and dermal enhancement. Multiple enlarged level I left axillary lymph nodes. The largest lymph node measured 2.4 cm in size. The large area of enhancement within the left breast extends posteriorly to abut the anterior portion of the pectoralis muscle. There was no evidence of internal mammary adenopathy or right axillary adenopathy.  PET/CT did show the left breast mass, and left axillary and subpectoralis nodal metastases, but no further areas throughout the chest/abd/pelvis of metastases.   3.  Patient currently undergoing  neoadjuvant chemotherapy with FEC x 6 cycles starting on 11/02/12, which will be followed by 12 cycles of weekly Taxol/Carbo.  CURRENT THERAPY:FEC cycle 3 day 8  INTERVAL HISTORY: Christine [name] 57 y.o. female returns for follow up today.  She is doing well today. She is c/o mouth pain today, but otherwise is feeling well.  Her port continues to do well.  The separation continues to close.  She deneis fevers, chills, nausea, vomiting, constipation, diarrhea, numbness or any further concerns.  MEDICAL HISTORY: Past Medical History  Diagnosis Date  . ALLERGIC RHINITIS   . GERD (gastroesophageal reflux disease)   . Hypertension   . Hypothyroidism   . Hx of colonic polyps   . Wears glasses   . Breast cancer 61, age 58    right  . Breast cancer 09/30/12    left, ER/PR -, Her 2 -    ALLERGIES:  is allergic to sulfa antibiotics and sulfonamide derivatives.  MEDICATIONS:  Current Outpatient Prescriptions  Medication Sig Dispense Refill  . Alum & Mag Hydroxide-Simeth (MAGIC MOUTHWASH) SOLN Take 5 mLs by mouth 4 (four) times daily.  280 mL  0  . dexamethasone (DECADRON) 4 MG tablet Take 2 tablets by mouth once a day on the day after chemotherapy and then take 2 tablets two times a day for 2 days. Take with food.  30 tablet  1  . diltiazem (CARDIZEM CD) 240 MG 24 hr capsule Take 1 capsule (240 mg total) by mouth daily.  90 capsule  2  . doxycycline (VIBRA-TABS) 100 MG tablet Take 1 tablet (100 mg total) by mouth  2 (two) times daily.  28 tablet  2  . fluconazole (DIFLUCAN) 200 MG tablet Take 1 tablet by mouth every other day.      . hydrochlorothiazide (MICROZIDE) 12.5 MG capsule TAKE 1 CAPSULE (12.5 MG TOTAL) BY MOUTH EVERY MORNING.  90 capsule  2  . latanoprost (XALATAN) 0.005 % ophthalmic solution Place 1 drop into both eyes at bedtime.       Marland Kitchen levothyroxine (SYNTHROID, LEVOTHROID) 50 MCG tablet TAKE 1 TABLET (50 MCG TOTAL) BY MOUTH DAILY.  90 tablet  2  . lidocaine-prilocaine (EMLA) cream  Apply topically as needed.  30 g  6  . LORazepam (ATIVAN) 0.5 MG tablet Take 1 tablet (0.5 mg total) by mouth every 6 (six) hours as needed (Nausea or vomiting).  30 tablet  0  . NEXIUM 40 MG capsule TAKE 1 CAPSULE (40 MG TOTAL) BY MOUTH DAILY BEFORE BREAKFAST.  90 capsule  1  . ondansetron (ZOFRAN) 8 MG tablet Take 1 tablet (8 mg total) by mouth 2 (two) times daily as needed. Take two times a day as needed for nausea or vomiting starting on the third day after chemotherapy.  30 tablet  1  . PATADAY 0.2 % SOLN       . potassium chloride SA (KLOR-CON M20) 20 MEQ tablet Take 1 tablet (20 mEq total) by mouth 2 (two) times daily. For 7 days  14 tablet  0  . prochlorperazine (COMPAZINE) 10 MG tablet Take 1 tablet (10 mg total) by mouth every 6 (six) hours as needed (Nausea or vomiting).  30 tablet  1  . prochlorperazine (COMPAZINE) 25 MG suppository Place 1 suppository (25 mg total) rectally every 12 (twelve) hours as needed for nausea.  12 suppository  3   No current facility-administered medications for this visit.    SURGICAL HISTORY:  Past Surgical History  Procedure Laterality Date  . Tonsillectomy    . Caesarean section      x 1  . Mastectomy  04/1987    right side  . Abdominal hysterectomy  06/1988    fibroids  . Portacath placement Right 10/28/2012    Procedure: PORT PLACEMENT;  Surgeon: Clovis Pu. Cornett, MD;  Location: [facility];  Service: General;  Laterality: Right;  Right Subclavian Vein    REVIEW OF SYSTEMS:  General: fatigue (+), night sweats (-), fever (-), pain (-) Lymph: palpable nodes (-) HEENT: vision changes (-), mucositis (-), gum bleeding (-), epistaxis (-) Cardiovascular: chest pain (-), palpitations (-) Pulmonary: shortness of breath (-), dyspnea on exertion (-), cough (-), hemoptysis (-) GI:  Early satiety (-), melena (-), dysphagia (-), nausea/vomiting (-), diarrhea (-) GU: dysuria (-), hematuria (-), incontinence (-) Musculoskeletal: joint  swelling (-), joint pain (-), back pain (-) Neuro: weakness (-), numbness (-), headache (-), confusion (-) Skin: Rash (-), lesions (-), dryness (-) Psych: depression (-), suicidal/homicidal ideation (-), feeling of hopelessness (-)   PHYSICAL EXAMINATION: Blood pressure 116/75, pulse 99, temperature 98.3 F (36.8 C), temperature source Oral, resp. rate 20, height 5\' 4"  (1.626 m), weight 217 lb 4.8 oz (98.567 kg). Body mass index is 37.28 kg/(m^2). General: Patient is a well appearing female in no acute distress HEENT: PERRLA, sclerae anicteric no conjunctival pallor, MMM. Small red lesions on tip of tongue and left buccal mucosa. Neck: supple, no palpable adenopathy Lungs: clear to auscultation bilaterally, no wheezes, rhonchi, or rales Cardiovascular: regular rate rhythm, S1, S2, no murmurs, rubs or gallops Abdomen: Soft, non-tender, non-distended, normoactive bowel  sounds, no HSM Extremities: warm and well perfused, no clubbing, cyanosis, or edema Skin: No rashes or lesions Neuro: Non-focal Breasts: Right mastectomy site without nodularity or sign of recurrence.  Left breast mass palpable along with dimpling of skin and inversion of the nipple--softer.  Right chest wall port with open incision site.  Yellow tissue at wound bed, no erythema, tenderness, warmth, or exudate.  ECOG PERFORMANCE STATUS: 1 - Symptomatic but completely ambulatory  LABORATORY DATA: Lab Results  Component Value Date   WBC 3.1* 12/21/2012   HGB 11.0* 12/21/2012   HCT 32.1* 12/21/2012   MCV 91.6 12/21/2012   PLT 100* 12/21/2012      Chemistry      Component Value Date/Time   NA 136 12/21/2012 1349   NA 144 10/28/2012 1248   K 3.7 12/21/2012 1349   K 3.9 10/28/2012 1248   CL 101 12/21/2012 1349   CL 108 10/28/2012 1248   CO2 25 12/21/2012 1349   CO2 29 09/13/2011 1910   BUN 12.9 12/21/2012 1349   BUN 10 10/28/2012 1248   CREATININE 0.8 12/21/2012 1349   CREATININE 0.80 10/28/2012 1248      Component Value Date/Time    CALCIUM 8.9 12/21/2012 1349   CALCIUM 9.4 09/13/2011 1910   ALKPHOS 97 12/21/2012 1349   ALKPHOS 78 09/13/2011 1910   AST 16 12/21/2012 1349   AST 18 09/13/2011 1910   ALT 14 12/21/2012 1349   ALT 12 09/13/2011 1910   BILITOT 0.38 12/21/2012 1349   BILITOT 0.3 09/13/2011 1910       RADIOGRAPHIC STUDIES:  Ct Chest W Contrast  10/27/2012  *RADIOLOGY REPORT*  Clinical Data:  High risk breast cancer staging.  Right breast cancer diagnosed 1988.  Left breast cancer diagnosed February 2014.  CT CHEST, ABDOMEN AND PELVIS WITH CONTRAST  Technique:  Multidetector CT imaging of the chest, abdomen and pelvis was performed following the standard protocol during bolus administration of intravenous contrast.  Contrast: OMNIPAQUE IOHEXOL 300 MG/ML  SOLN  Comparison:  PET CT scan 03/26 1014  CT CHEST  Findings:  Right breast mastectomy anatomy.  There is thickening of the skin of the left breast to 11 mm.  There is a mass within the deep posterior left breast measuring 29 x 30 mm (image 6).  There are enlarged round abnormal lymph nodes in the left axilla measuring up to 20 mm short axis (image 22).  Lymph nodes extend beneath the sub pectoralis muscles on the left (image 12).  There is no evidence of internal mammary lymphadenopathy.  No infraclavicular adenopathy.  No mediastinal adenopathy.  No pericardial fluid.  No suspicious pulmonary nodules.  IMPRESSION:  1.  Left breast mass consistent with breast carcinoma. 2.  Skin thickening over the left breast is concerning for inflammatory breast cancer. 3.  Nodal metastasis in the left axilla and sub pectoralis nodal stations. 4.  No evidence of central nodal metastasis or mediastinal metastasis  CT ABDOMEN AND PELVIS  Findings:  No focal hepatic lesion.  The gallbladder, pancreas, spleen, adrenal gland is, and kidneys are normal.  The stomach, small bowel, appendix, cecum are normal.  The colon and rectosigmoid colon are normal.  Abdominal aorta is normal caliber.  No  retroperitoneal or periportal lymphadenopathy.  No mesenteric or peritoneal disease.  No free fluid in  the pelvis.  Post hysterectomy anatomy.  No pelvic lymphadenopathy. Review of  bone windows demonstrates no aggressive osseous lesions.  IMPRESSION:  No evidence of metastasis  in the abdomen or pelvis.   Original Report Authenticated By: Genevive Bi, M.D.    Ct Abdomen Pelvis W Contrast  10/27/2012  *RADIOLOGY REPORT*  Clinical Data:  High risk breast cancer staging.  Right breast cancer diagnosed 1988.  Left breast cancer diagnosed February 2014.  CT CHEST, ABDOMEN AND PELVIS WITH CONTRAST  Technique:  Multidetector CT imaging of the chest, abdomen and pelvis was performed following the standard protocol during bolus administration of intravenous contrast.  Contrast: OMNIPAQUE IOHEXOL 300 MG/ML  SOLN  Comparison:  PET CT scan 03/26 1014  CT CHEST  Findings:  Right breast mastectomy anatomy.  There is thickening of the skin of the left breast to 11 mm.  There is a mass within the deep posterior left breast measuring 29 x 30 mm (image 6).  There are enlarged round abnormal lymph nodes in the left axilla measuring up to 20 mm short axis (image 22).  Lymph nodes extend beneath the sub pectoralis muscles on the left (image 12).  There is no evidence of internal mammary lymphadenopathy.  No infraclavicular adenopathy.  No mediastinal adenopathy.  No pericardial fluid.  No suspicious pulmonary nodules.  IMPRESSION:  1.  Left breast mass consistent with breast carcinoma. 2.  Skin thickening over the left breast is concerning for inflammatory breast cancer. 3.  Nodal metastasis in the left axilla and sub pectoralis nodal stations. 4.  No evidence of central nodal metastasis or mediastinal metastasis  CT ABDOMEN AND PELVIS  Findings:  No focal hepatic lesion.  The gallbladder, pancreas, spleen, adrenal gland is, and kidneys are normal.  The stomach, small bowel, appendix, cecum are normal.  The colon and  rectosigmoid colon are normal.  Abdominal aorta is normal caliber.  No retroperitoneal or periportal lymphadenopathy.  No mesenteric or peritoneal disease.  No free fluid in  the pelvis.  Post hysterectomy anatomy.  No pelvic lymphadenopathy. Review of  bone windows demonstrates no aggressive osseous lesions.  IMPRESSION:  No evidence of metastasis in the abdomen or pelvis.   Original Report Authenticated By: Genevive Bi, M.D.    Mr Breast Bilateral W Wo Contrast  10/12/2012  *RADIOLOGY REPORT*  Clinical Data: History of previous malignant mastectomy of the right breast 25 years ago.  The patient developed diffuse skin thickening and firmness within the left breast.  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  BUN and creatinine were obtained on site at [facility] at [address] Results:  BUN 7 mg/dL,  Creatinine 1.0 mg/dL.  BILATERAL BREAST MRI WITH AND WITHOUT CONTRAST  Technique: Multiplanar, multisequence MR images of both breasts were obtained prior to and following the intravenous administration of 20ml of Multihance.  Three dimensional images were evaluated at the independent DynaCad workstation.  Comparison:  Mammograms dated 09/30/2012 and 09/28/2012.  Findings: There has been a previous right mastectomy.  There is marked diffuse enhancement involving the majority of the left breast.  This measures 13.0 x 12.5 x 9.5 cm in size.  This is associated with a mixture of plateau and washout enhancement kinetics.  In addition,  there is diffuse skin thickening and dermal enhancement.  There are multiple enlarged level I left axillary lymph nodes.  The largest lymph node measures 2.4 cm in size.  The large area of enhancement within the left breast extends posteriorly to abut the anterior portion of the pectoral muscle. There  is no evidence for internal mammary  adenopathy or right axillary adenopathy.  There are no additional findings.  IMPRESSION:  1.  Large ( 13 cm) area of enhancement involving the majority of the left breast consistent with the patient's known invasive ductal carcinoma and DCIS. This extends posteriorly to abut the pectoralis muscle. 2.  Multiple enlarged level I left axillary lymph nodes. 3.  Previous right mastectomy.  RECOMMENDATION: Treatment plan  THREE-DIMENSIONAL MR IMAGE RENDERING ON INDEPENDENT WORKSTATION:  Three-dimensional MR images were rendered by post-processing of the original MR data on an independent workstation.  The three- dimensional MR images were interpreted, and findings were reported in the accompanying complete MRI report for this study.  BI-RADS CATEGORY 6:  Known biopsy-proven malignancy - appropriate action should be taken.   Original Report Authenticated By: Rolla Plate, M.D.    Nm Pet Image Initial (pi) Skull Base To Thigh  10/27/2012  *RADIOLOGY REPORT*  Clinical Data: Initial treatment strategy for high risk of breast cancer. Triple negative  NUCLEAR MEDICINE PET SKULL BASE TO THIGH  Fasting Blood Glucose:  97  Technique:  17.9 mCi F-18 FDG was injected intravenously. CT data was obtained and used for attenuation correction and anatomic localization only.  (This was not acquired as a diagnostic CT examination.) Additional exam technical data entered on technologist worksheet.  Comparison:  Findings:  Neck: No hypermetabolic lymph nodes in the neck.  Chest:  There is intense metabolic activity ( SUV max = 40.9) associated with a large portion of the glandular tissue of the left breast consistent with carcinoma.  There are intensely hypermetabolic left axillary lymph nodes with SUV max = 15.1.  These nodes are rounded and measure up to 2 cm. Hypermetabolic nodes extend to the sub pectoralis nodal station (image 71).  There is a single hypermetabolic internal mammary lymph node measuring 5 mm just left of the  sternum (image 87).  This has intense metabolic activity for size with SUV max = 7.1.  No hypermetabolic mediastinal lymph nodes.  No hypermetabolic pulmonary nodules.  Abdomen/Pelvis:  No abnormal hypermetabolic activity within the liver, pancreas, adrenal glands, or spleen.  No hypermetabolic lymph nodes in the abdomen or pelvis.  Skeleton:  No focal hypermetabolic activity to suggest skeletal metastasis.  IMPRESSION:  1.  Hypermetabolic breast tissue on the left consistent with primary carcinoma. 2.  Hypermetabolic nodal metastasis in the left axilla extending to the sub pectoralis nodes. 3.  Single hypermetabolic left internal mammary lymph node.  4.  No evidence of pulmonary metastasis.  5.  No evidence of metastasis beneath hemidiaphragms or within the skeleton.   Original Report Authenticated By: Genevive Bi, M.D.    Dg Chest Port 1 View  10/28/2012  *RADIOLOGY REPORT*  Clinical Data: Port-A-Cath placement.  PORTABLE CHEST - 1 VIEW  Comparison: None.  Findings: The patient is rotated on the study.  Right side Port-A- Cath is in place.  Tubing appears intact.  Tip of the catheter projects in the mid to lower superior vena cava.  No pneumothorax identified.  Heart size is normal.  Lungs are clear.  The patient is status post right mastectomy and axillary dissection.  IMPRESSION: Tip of Port-A-Cath projects over the mid to lower superior vena cava.  The patient is rotated on the study.  Exact position of the tip of the catheter could be determined with the lateral film. No pneumothorax.   Original Report Authenticated By: Holley Dexter, M.D.    Dg Fluoro Guide Cv Line-no  Report  10/28/2012  CLINICAL DATA: [facility]   FLOURO GUIDE CV LINE  Fluoroscopy was utilized by the requesting physician.  No radiographic  interpretation.      ASSESSMENT:   57 year old female with  #1 history of breast cancer of the right breast status post mastectomy when she was 57 years old. Due to this she was referred  for genetic testing.   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 Patient's tumor is ER negative PR negative HER-2/neu negative with a Ki-67 of 21%. Patient is being seen in medical oncology for neoadjuvant chemotherapy. Patient and I discussed her pathology in detail. We discussed treatment for breast cancer including surgical and with chemotherapy. Patient understands that she may still need a mastectomy however by giving her neoadjuvant chemotherapy her surgery may be made a little bit easier hopefully with giving her negative margins. She understands that neoadjuvant chemotherapy will also treat her distant disease.I discussed type of chemotherapy that she should have. This would include dose dense FEC x6 cycles followed by weekly Taxol carboplatinum for 12 weeks.  #3 Patient underwent staging studies with PET/CT, and received echo, port placement and chemotherapy class.    #4 Open port wound, healing  PLAN:   1. Doing well.  Labs are stable after chemotherapy.  She will continue to use her saltwater rinses for her mouth.  No lesions seen inside her mouth.  2. Christine [name] will return next week for labs, an appt, and chemotherapy.   All questions were answered. The patient knows to call the clinic with any problems, questions or concerns. We can certainly see the patient much sooner if necessary.  I spent 25 minutes counseling the patient face to face. The total time spent in the appointment was 30 minutes.   Cherie Ouch Lyn Hollingshead, NP Medical Oncology [facility] Phone: [phone] 12/21/2012, 3:30 PM

## 2012-12-22 ENCOUNTER — Encounter: Payer: Self-pay | Admitting: Adult Health

## 2012-12-23 ENCOUNTER — Encounter: Payer: Self-pay | Admitting: Oncology

## 2012-12-23 NOTE — Progress Notes (Signed)
Faxed clinical information to [facility] @ [phone] °

## 2012-12-28 ENCOUNTER — Other Ambulatory Visit (HOSPITAL_BASED_OUTPATIENT_CLINIC_OR_DEPARTMENT_OTHER): Payer: BC Managed Care – PPO

## 2012-12-28 ENCOUNTER — Ambulatory Visit (HOSPITAL_BASED_OUTPATIENT_CLINIC_OR_DEPARTMENT_OTHER): Payer: BC Managed Care – PPO | Admitting: Oncology

## 2012-12-28 ENCOUNTER — Encounter: Payer: Self-pay | Admitting: Oncology

## 2012-12-28 ENCOUNTER — Ambulatory Visit (HOSPITAL_BASED_OUTPATIENT_CLINIC_OR_DEPARTMENT_OTHER): Payer: BC Managed Care – PPO

## 2012-12-28 VITALS — BP 132/83 | HR 90 | Temp 98.1°F | Resp 20 | Ht 64.0 in | Wt 215.3 lb

## 2012-12-28 DIAGNOSIS — C50912 Malignant neoplasm of unspecified site of left female breast: Secondary | ICD-10-CM

## 2012-12-28 DIAGNOSIS — Z171 Estrogen receptor negative status [ER-]: Secondary | ICD-10-CM

## 2012-12-28 DIAGNOSIS — C50919 Malignant neoplasm of unspecified site of unspecified female breast: Secondary | ICD-10-CM

## 2012-12-28 DIAGNOSIS — Z5111 Encounter for antineoplastic chemotherapy: Secondary | ICD-10-CM

## 2012-12-28 LAB — COMPREHENSIVE METABOLIC PANEL (CC13)
AST: 24 U/L (ref 5–34)
Alkaline Phosphatase: 95 U/L (ref 40–150)
BUN: 11.5 mg/dL (ref 7.0–26.0)
Glucose: 200 mg/dl — ABNORMAL HIGH (ref 70–99)
Sodium: 143 mEq/L (ref 136–145)
Total Bilirubin: 0.33 mg/dL (ref 0.20–1.20)

## 2012-12-28 LAB — CBC WITH DIFFERENTIAL/PLATELET
BASO%: 0.4 % (ref 0.0–2.0)
Basophils Absolute: 0 10*3/uL (ref 0.0–0.1)
EOS%: 0.5 % (ref 0.0–7.0)
HGB: 10.6 g/dL — ABNORMAL LOW (ref 11.6–15.9)
MCH: 30.5 pg (ref 25.1–34.0)
MCHC: 33 g/dL (ref 31.5–36.0)
MCV: 92.2 fL (ref 79.5–101.0)
MONO%: 13.4 % (ref 0.0–14.0)
NEUT%: 64.7 % (ref 38.4–76.8)
RDW: 15.5 % — ABNORMAL HIGH (ref 11.2–14.5)
lymph#: 1.6 10*3/uL (ref 0.9–3.3)

## 2012-12-28 MED ORDER — FLUOROURACIL CHEMO INJECTION 2.5 GM/50ML
500.0000 mg/m2 | Freq: Once | INTRAVENOUS | Status: AC
  Administered 2012-12-28: 1050 mg via INTRAVENOUS
  Filled 2012-12-28: qty 21

## 2012-12-28 MED ORDER — DEXAMETHASONE SODIUM PHOSPHATE 20 MG/5ML IJ SOLN
12.0000 mg | Freq: Once | INTRAMUSCULAR | Status: AC
  Administered 2012-12-28: 12 mg via INTRAVENOUS

## 2012-12-28 MED ORDER — SODIUM CHLORIDE 0.9 % IV SOLN
150.0000 mg | Freq: Once | INTRAVENOUS | Status: AC
  Administered 2012-12-28: 150 mg via INTRAVENOUS
  Filled 2012-12-28: qty 5

## 2012-12-28 MED ORDER — SODIUM CHLORIDE 0.9 % IJ SOLN
10.0000 mL | INTRAMUSCULAR | Status: DC | PRN
  Administered 2012-12-28: 10 mL
  Filled 2012-12-28: qty 10

## 2012-12-28 MED ORDER — HEPARIN SOD (PORK) LOCK FLUSH 100 UNIT/ML IV SOLN
500.0000 [IU] | Freq: Once | INTRAVENOUS | Status: AC | PRN
  Administered 2012-12-28: 500 [IU]
  Filled 2012-12-28: qty 5

## 2012-12-28 MED ORDER — SODIUM CHLORIDE 0.9 % IV SOLN
Freq: Once | INTRAVENOUS | Status: AC
  Administered 2012-12-28: 16:00:00 via INTRAVENOUS

## 2012-12-28 MED ORDER — EPIRUBICIN HCL CHEMO IV INJECTION 200 MG/100ML
100.0000 mg/m2 | Freq: Once | INTRAVENOUS | Status: AC
  Administered 2012-12-28: 214 mg via INTRAVENOUS
  Filled 2012-12-28: qty 107

## 2012-12-28 MED ORDER — SODIUM CHLORIDE 0.9 % IV SOLN
500.0000 mg/m2 | Freq: Once | INTRAVENOUS | Status: AC
  Administered 2012-12-28: 1080 mg via INTRAVENOUS
  Filled 2012-12-28: qty 54

## 2012-12-28 MED ORDER — PALONOSETRON HCL INJECTION 0.25 MG/5ML
0.2500 mg | Freq: Once | INTRAVENOUS | Status: AC
  Administered 2012-12-28: 0.25 mg via INTRAVENOUS

## 2012-12-28 NOTE — Patient Instructions (Addendum)
[facility] Discharge Instructions for Patients Receiving Chemotherapy  Today you received the following chemotherapy agents: 28fu, cytoxan, ellence  To help prevent nausea and vomiting after your treatment, we encourage you to take your nausea medication.  Take it as often as prescribed.     If you develop nausea and vomiting that is not controlled by your nausea medication, call the clinic. If it is after clinic hours your family physician or the after hours number for the clinic or go to the Emergency Department.   BELOW ARE SYMPTOMS THAT SHOULD BE REPORTED IMMEDIATELY:  *FEVER GREATER THAN 100.5 F  *CHILLS WITH OR WITHOUT FEVER  NAUSEA AND VOMITING THAT IS NOT CONTROLLED WITH YOUR NAUSEA MEDICATION  *UNUSUAL SHORTNESS OF BREATH  *UNUSUAL BRUISING OR BLEEDING  TENDERNESS IN MOUTH AND THROAT WITH OR WITHOUT PRESENCE OF ULCERS  *URINARY PROBLEMS  *BOWEL PROBLEMS  UNUSUAL RASH Items with * indicate a potential emergency and should be followed up as soon as possible.  Feel free to call the clinic you have any questions or concerns. The clinic phone number is [phone].   I have been informed and understand all the instructions given to me. I know to contact the clinic, my physician, or go to the Emergency Department if any problems should occur. I do not have any questions at this time, but understand that I may call the clinic during office hours   should I have any questions or need assistance in obtaining follow up care.    __________________________________________  _____________  __________ Signature of Patient or Authorized Representative            Date                   Time    __________________________________________ Nurse's Signature

## 2012-12-29 ENCOUNTER — Other Ambulatory Visit: Payer: Self-pay | Admitting: *Deleted

## 2012-12-29 ENCOUNTER — Ambulatory Visit (HOSPITAL_BASED_OUTPATIENT_CLINIC_OR_DEPARTMENT_OTHER): Payer: BC Managed Care – PPO

## 2012-12-29 VITALS — BP 104/70 | HR 104 | Temp 98.4°F

## 2012-12-29 DIAGNOSIS — C50919 Malignant neoplasm of unspecified site of unspecified female breast: Secondary | ICD-10-CM

## 2012-12-29 DIAGNOSIS — Z5189 Encounter for other specified aftercare: Secondary | ICD-10-CM

## 2012-12-29 DIAGNOSIS — C50912 Malignant neoplasm of unspecified site of left female breast: Secondary | ICD-10-CM

## 2012-12-29 DIAGNOSIS — C773 Secondary and unspecified malignant neoplasm of axilla and upper limb lymph nodes: Secondary | ICD-10-CM

## 2012-12-29 MED ORDER — PEGFILGRASTIM INJECTION 6 MG/0.6ML
6.0000 mg | Freq: Once | SUBCUTANEOUS | Status: AC
  Administered 2012-12-29: 6 mg via SUBCUTANEOUS
  Filled 2012-12-29: qty 0.6

## 2012-12-29 MED ORDER — LORAZEPAM 0.5 MG PO TABS: 0.5000 mg | ORAL_TABLET | Freq: Four times a day (QID) | ORAL | Status: DC | PRN

## 2013-01-02 ENCOUNTER — Other Ambulatory Visit: Payer: Self-pay | Admitting: Adult Health

## 2013-01-04 ENCOUNTER — Encounter: Payer: Self-pay | Admitting: Adult Health

## 2013-01-04 ENCOUNTER — Other Ambulatory Visit (HOSPITAL_BASED_OUTPATIENT_CLINIC_OR_DEPARTMENT_OTHER): Payer: BC Managed Care – PPO | Admitting: Lab

## 2013-01-04 ENCOUNTER — Telehealth: Payer: Self-pay | Admitting: Oncology

## 2013-01-04 ENCOUNTER — Ambulatory Visit (HOSPITAL_BASED_OUTPATIENT_CLINIC_OR_DEPARTMENT_OTHER): Payer: BC Managed Care – PPO | Admitting: Adult Health

## 2013-01-04 VITALS — BP 118/74 | HR 97 | Temp 98.5°F | Resp 20 | Ht 64.0 in | Wt 212.2 lb

## 2013-01-04 DIAGNOSIS — C50419 Malignant neoplasm of upper-outer quadrant of unspecified female breast: Secondary | ICD-10-CM

## 2013-01-04 DIAGNOSIS — E876 Hypokalemia: Secondary | ICD-10-CM

## 2013-01-04 DIAGNOSIS — Z853 Personal history of malignant neoplasm of breast: Secondary | ICD-10-CM

## 2013-01-04 DIAGNOSIS — C50912 Malignant neoplasm of unspecified site of left female breast: Secondary | ICD-10-CM

## 2013-01-04 DIAGNOSIS — G589 Mononeuropathy, unspecified: Secondary | ICD-10-CM

## 2013-01-04 DIAGNOSIS — C773 Secondary and unspecified malignant neoplasm of axilla and upper limb lymph nodes: Secondary | ICD-10-CM

## 2013-01-04 DIAGNOSIS — K1370 Unspecified lesions of oral mucosa: Secondary | ICD-10-CM

## 2013-01-04 DIAGNOSIS — G629 Polyneuropathy, unspecified: Secondary | ICD-10-CM

## 2013-01-04 LAB — COMPREHENSIVE METABOLIC PANEL (CC13)
ALT: 21 U/L (ref 0–55)
AST: 13 U/L (ref 5–34)
Albumin: 3.4 g/dL — ABNORMAL LOW (ref 3.5–5.0)
Alkaline Phosphatase: 105 U/L (ref 40–150)
BUN: 15.8 mg/dL (ref 7.0–26.0)
Potassium: 3 mEq/L — CL (ref 3.5–5.1)
Sodium: 138 mEq/L (ref 136–145)

## 2013-01-04 LAB — CBC WITH DIFFERENTIAL/PLATELET
BASO%: 0.1 % (ref 0.0–2.0)
Basophils Absolute: 0 10*3/uL (ref 0.0–0.1)
EOS%: 0.6 % (ref 0.0–7.0)
MCH: 31.4 pg (ref 25.1–34.0)
MCHC: 34.3 g/dL (ref 31.5–36.0)
MCV: 91.5 fL (ref 79.5–101.0)
MONO%: 3.4 % (ref 0.0–14.0)
RBC: 3.37 10*6/uL — ABNORMAL LOW (ref 3.70–5.45)
RDW: 15.6 % — ABNORMAL HIGH (ref 11.2–14.5)

## 2013-01-04 MED ORDER — GABAPENTIN 100 MG PO CAPS: 100.0000 mg | ORAL_CAPSULE | Freq: Three times a day (TID) | ORAL | Status: DC

## 2013-01-04 MED ORDER — POTASSIUM CHLORIDE CRYS ER 20 MEQ PO TBCR: 20.0000 meq | EXTENDED_RELEASE_TABLET | Freq: Every day | ORAL | Status: DC

## 2013-01-04 NOTE — Progress Notes (Signed)
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   PRIOR THERAPY:  1. Patient has history of right breast cancer patient underwent a mastectomy when she was 57 years old. This occurred during her pregnancy. After that patient did not take any kind of further treatments. She continued to do well.   2.Recently she noted changes in her nipple had become itchy and retracted. Because of this she underwent mammogram with ultrasound that showed 2 areas in the left breast one at 12:00 position and a second at 3:00 position. She also had an ultrasound performed that showed the diameter to be at least 6 cm and multiple left axillary lymph nodes that were abnormal. She then went on to have a biopsy performed on 09/30/2012. 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 Patient went on to have MRIs of the breasts performed and was noted to be marked diffuse enhancement involving majority of the left breast measuring 13.0 x 12.5 x 9.5 cm. There was diffuse skin thickening and dermal enhancement. Multiple enlarged level I left axillary lymph nodes. The largest lymph node measured 2.4 cm in size. The large area of enhancement within the left breast extends posteriorly to abut the anterior portion of the pectoralis muscle. There was no evidence of internal mammary adenopathy or right axillary adenopathy.  PET/CT did show the left breast mass, and left axillary and subpectoralis nodal metastases, but no further areas throughout the chest/abd/pelvis of metastases.   3.  Patient currently undergoing  neoadjuvant chemotherapy with FEC x 6 cycles starting on 11/02/12, which will be followed by 12 cycles of weekly Taxol/Carbo.  CURRENT THERAPY:FEC cycle 4 day 1  INTERVAL HISTORY: Christine [name] 57 y.o. female returns for follow up today.  She is not feeling well.  She developed mucositis and required magic mouthwash, she has also had odynophagia with food and water.  She also started having numbness and tingling in her right hand, it covers her entire hand.  She has no motor deficits associated with this.  She has vaginal itching and burning, she had Fluconazole refilled and is taking it daily.  She had diarrhea yesterday, but today it is better.  She denies fevers, chills, nausea, vomiting, constipation, or any further concerns.    MEDICAL HISTORY: Past Medical History  Diagnosis Date  . ALLERGIC RHINITIS   . GERD (gastroesophageal reflux disease)   . Hypertension   . Hypothyroidism   . Hx of colonic polyps   . Wears glasses   . Breast cancer 64, age 57    right  . Breast cancer 09/30/12    left, ER/PR -, Her 2 -    ALLERGIES:  is allergic to sulfa antibiotics and sulfonamide derivatives.  MEDICATIONS:  Current Outpatient Prescriptions  Medication Sig Dispense Refill  . dexamethasone (DECADRON) 4 MG tablet Take 2 tablets by mouth once a day on the day after chemotherapy and then take 2 tablets two times a day for 2 days. Take with food.  30 tablet  1  . diltiazem (CARDIZEM CD) 240 MG 24 hr capsule Take 1 capsule (240 mg total) by  mouth daily.  90 capsule  2  . doxycycline (VIBRA-TABS) 100 MG tablet Take 1 tablet (100 mg total) by mouth 2 (two) times daily.  28 tablet  2  . fluconazole (DIFLUCAN) 200 MG tablet Take 1 tablet by mouth every other day.      . hydrochlorothiazide (MICROZIDE) 12.5 MG capsule TAKE 1 CAPSULE (12.5 MG TOTAL) BY MOUTH EVERY MORNING.  90 capsule  2  . latanoprost (XALATAN) 0.005 % ophthalmic solution Place 1 drop into both eyes at bedtime.       Marland Kitchen  levothyroxine (SYNTHROID, LEVOTHROID) 50 MCG tablet TAKE 1 TABLET (50 MCG TOTAL) BY MOUTH DAILY.  90 tablet  2  . lidocaine-prilocaine (EMLA) cream Apply topically as needed.  30 g  6  . LORazepam (ATIVAN) 0.5 MG tablet Take 1 tablet (0.5 mg total) by mouth every 6 (six) hours as needed (Nausea or vomiting).  30 tablet  0  . NEXIUM 40 MG capsule TAKE 1 CAPSULE (40 MG TOTAL) BY MOUTH DAILY BEFORE BREAKFAST.  90 capsule  1  . nystatin (MYCOSTATIN) 100000 UNIT/ML suspension TAKE 5 MLS BY MOUTH 4 (FOUR) TIMES DAILY.  180 mL  0  . ondansetron (ZOFRAN) 8 MG tablet Take 1 tablet (8 mg total) by mouth 2 (two) times daily as needed. Take two times a day as needed for nausea or vomiting starting on the third day after chemotherapy.  30 tablet  1  . PATADAY 0.2 % SOLN       . prochlorperazine (COMPAZINE) 10 MG tablet Take 1 tablet (10 mg total) by mouth every 6 (six) hours as needed (Nausea or vomiting).  30 tablet  1  . prochlorperazine (COMPAZINE) 25 MG suppository Place 1 suppository (25 mg total) rectally every 12 (twelve) hours as needed for nausea.  12 suppository  3   No current facility-administered medications for this visit.    SURGICAL HISTORY:  Past Surgical History  Procedure Laterality Date  . Tonsillectomy    . Caesarean section      x 1  . Mastectomy  04/1987    right side  . Abdominal hysterectomy  06/1988    fibroids  . Portacath placement Right 10/28/2012    Procedure: PORT PLACEMENT;  Surgeon: Clovis Pu. Cornett, MD;  Location: [facility];  Service: General;  Laterality: Right;  Right Subclavian Vein    REVIEW OF SYSTEMS:  General: fatigue (+), night sweats (-), fever (-), pain (-) Lymph: palpable nodes (-) HEENT: vision changes (-), mucositis (-), gum bleeding (-), epistaxis (-) Cardiovascular: chest pain (-), palpitations (-) Pulmonary: shortness of breath (-), dyspnea on exertion (-), cough (-), hemoptysis (-) GI:  Early satiety (-), melena (-), dysphagia (-),  nausea/vomiting (-), diarrhea (-) GU: dysuria (-), hematuria (-), incontinence (-) Musculoskeletal: joint swelling (-), joint pain (-), back pain (-) Neuro: weakness (-), numbness (-), headache (-), confusion (-) Skin: Rash (-), lesions (-), dryness (-) Psych: depression (-), suicidal/homicidal ideation (-), feeling of hopelessness (-)   PHYSICAL EXAMINATION: Blood pressure 118/74, pulse 97, temperature 98.5 F (36.9 C), temperature source Oral, resp. rate 20, height 5\' 4"  (1.626 m), weight 212 lb 3.2 oz (96.253 kg). Body mass index is 36.41 kg/(m^2). General: Patient is a well appearing female in no acute distress HEENT: PERRLA, sclerae anicteric no conjunctival pallor, MMM. Small red lesions on tip of tongue and left buccal mucosa. Neck: supple, no palpable adenopathy Lungs: clear to auscultation bilaterally, no wheezes, rhonchi, or rales Cardiovascular: regular rate rhythm,  S1, S2, no murmurs, rubs or gallops Abdomen: Soft, non-tender, non-distended, normoactive bowel sounds, no HSM Extremities: warm and well perfused, no clubbing, cyanosis, or edema Skin: No rashes or lesions Neuro: Non-focal Breasts: Right mastectomy site without nodularity or sign of recurrence.  Left breast mass palpable along with dimpling of skin and inversion of the nipple--softer.  Right chest wall port with open incision site.  Yellow tissue at wound bed, no erythema, tenderness, warmth, or exudate.  ECOG PERFORMANCE STATUS: 1 - Symptomatic but completely ambulatory  LABORATORY DATA: Lab Results  Component Value Date   WBC 1.9* 01/04/2013   HGB 10.6* 01/04/2013   HCT 30.9* 01/04/2013   MCV 91.5 01/04/2013   PLT 83* 01/04/2013      Chemistry      Component Value Date/Time   NA 143 12/28/2012 1351   NA 144 10/28/2012 1248   K 3.3* 12/28/2012 1351   K 3.9 10/28/2012 1248   CL 103 12/28/2012 1351   CL 108 10/28/2012 1248   CO2 28 12/28/2012 1351   CO2 29 09/13/2011 1910   BUN 11.5 12/28/2012 1351   BUN 10 10/28/2012  1248   CREATININE 0.8 12/28/2012 1351   CREATININE 0.80 10/28/2012 1248      Component Value Date/Time   CALCIUM 9.1 12/28/2012 1351   CALCIUM 9.4 09/13/2011 1910   ALKPHOS 95 12/28/2012 1351   ALKPHOS 78 09/13/2011 1910   AST 24 12/28/2012 1351   AST 18 09/13/2011 1910   ALT 21 12/28/2012 1351   ALT 12 09/13/2011 1910   BILITOT 0.33 12/28/2012 1351   BILITOT 0.3 09/13/2011 1910       RADIOGRAPHIC STUDIES:  Ct Chest W Contrast  10/27/2012  *RADIOLOGY REPORT*  Clinical Data:  High risk breast cancer staging.  Right breast cancer diagnosed 1988.  Left breast cancer diagnosed February 2014.  CT CHEST, ABDOMEN AND PELVIS WITH CONTRAST  Technique:  Multidetector CT imaging of the chest, abdomen and pelvis was performed following the standard protocol during bolus administration of intravenous contrast.  Contrast: OMNIPAQUE IOHEXOL 300 MG/ML  SOLN  Comparison:  PET CT scan 03/26 1014  CT CHEST  Findings:  Right breast mastectomy anatomy.  There is thickening of the skin of the left breast to 11 mm.  There is a mass within the deep posterior left breast measuring 29 x 30 mm (image 6).  There are enlarged round abnormal lymph nodes in the left axilla measuring up to 20 mm short axis (image 22).  Lymph nodes extend beneath the sub pectoralis muscles on the left (image 12).  There is no evidence of internal mammary lymphadenopathy.  No infraclavicular adenopathy.  No mediastinal adenopathy.  No pericardial fluid.  No suspicious pulmonary nodules.  IMPRESSION:  1.  Left breast mass consistent with breast carcinoma. 2.  Skin thickening over the left breast is concerning for inflammatory breast cancer. 3.  Nodal metastasis in the left axilla and sub pectoralis nodal stations. 4.  No evidence of central nodal metastasis or mediastinal metastasis  CT ABDOMEN AND PELVIS  Findings:  No focal hepatic lesion.  The gallbladder, pancreas, spleen, adrenal gland is, and kidneys are normal.  The stomach, small bowel, appendix,  cecum are normal.  The colon and rectosigmoid colon are normal.  Abdominal aorta is normal caliber.  No retroperitoneal or periportal lymphadenopathy.  No mesenteric or peritoneal disease.  No free fluid in  the pelvis.  Post hysterectomy anatomy.  No pelvic lymphadenopathy. Review of  bone  windows demonstrates no aggressive osseous lesions.  IMPRESSION:  No evidence of metastasis in the abdomen or pelvis.   Original Report Authenticated By: Genevive Bi, M.D.    Ct Abdomen Pelvis W Contrast  10/27/2012  *RADIOLOGY REPORT*  Clinical Data:  High risk breast cancer staging.  Right breast cancer diagnosed 1988.  Left breast cancer diagnosed February 2014.  CT CHEST, ABDOMEN AND PELVIS WITH CONTRAST  Technique:  Multidetector CT imaging of the chest, abdomen and pelvis was performed following the standard protocol during bolus administration of intravenous contrast.  Contrast: OMNIPAQUE IOHEXOL 300 MG/ML  SOLN  Comparison:  PET CT scan 03/26 1014  CT CHEST  Findings:  Right breast mastectomy anatomy.  There is thickening of the skin of the left breast to 11 mm.  There is a mass within the deep posterior left breast measuring 29 x 30 mm (image 6).  There are enlarged round abnormal lymph nodes in the left axilla measuring up to 20 mm short axis (image 22).  Lymph nodes extend beneath the sub pectoralis muscles on the left (image 12).  There is no evidence of internal mammary lymphadenopathy.  No infraclavicular adenopathy.  No mediastinal adenopathy.  No pericardial fluid.  No suspicious pulmonary nodules.  IMPRESSION:  1.  Left breast mass consistent with breast carcinoma. 2.  Skin thickening over the left breast is concerning for inflammatory breast cancer. 3.  Nodal metastasis in the left axilla and sub pectoralis nodal stations. 4.  No evidence of central nodal metastasis or mediastinal metastasis  CT ABDOMEN AND PELVIS  Findings:  No focal hepatic lesion.  The gallbladder, pancreas, spleen, adrenal gland  is, and kidneys are normal.  The stomach, small bowel, appendix, cecum are normal.  The colon and rectosigmoid colon are normal.  Abdominal aorta is normal caliber.  No retroperitoneal or periportal lymphadenopathy.  No mesenteric or peritoneal disease.  No free fluid in  the pelvis.  Post hysterectomy anatomy.  No pelvic lymphadenopathy. Review of  bone windows demonstrates no aggressive osseous lesions.  IMPRESSION:  No evidence of metastasis in the abdomen or pelvis.   Original Report Authenticated By: Genevive Bi, M.D.    Mr Breast Bilateral W Wo Contrast  10/12/2012  *RADIOLOGY REPORT*  Clinical Data: History of previous malignant mastectomy of the right breast 25 years ago.  The patient developed diffuse skin thickening and firmness within the left breast.  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  BUN and creatinine were obtained on site at [facility] at [address] Results:  BUN 7 mg/dL,  Creatinine 1.0 mg/dL.  BILATERAL BREAST MRI WITH AND WITHOUT CONTRAST  Technique: Multiplanar, multisequence MR images of both breasts were obtained prior to and following the intravenous administration of 20ml of Multihance.  Three dimensional images were evaluated at the independent DynaCad workstation.  Comparison:  Mammograms dated 09/30/2012 and 09/28/2012.  Findings: There has been a previous right mastectomy.  There is marked diffuse enhancement involving the majority of the left breast.  This measures 13.0 x 12.5 x 9.5 cm in size.  This is associated with a mixture of plateau and washout enhancement kinetics.  In addition,  there is diffuse skin thickening and dermal enhancement.  There are multiple enlarged level I left axillary lymph nodes.  The largest lymph node measures 2.4 cm in size.  The large area of enhancement within the left  breast extends posteriorly to  abut the anterior portion of the pectoral muscle. There is no evidence for internal mammary adenopathy or right axillary adenopathy.  There are no additional findings.  IMPRESSION:  1.  Large ( 13 cm) area of enhancement involving the majority of the left breast consistent with the patient's known invasive ductal carcinoma and DCIS. This extends posteriorly to abut the pectoralis muscle. 2.  Multiple enlarged level I left axillary lymph nodes. 3.  Previous right mastectomy.  RECOMMENDATION: Treatment plan  THREE-DIMENSIONAL MR IMAGE RENDERING ON INDEPENDENT WORKSTATION:  Three-dimensional MR images were rendered by post-processing of the original MR data on an independent workstation.  The three- dimensional MR images were interpreted, and findings were reported in the accompanying complete MRI report for this study.  BI-RADS CATEGORY 6:  Known biopsy-proven malignancy - appropriate action should be taken.   Original Report Authenticated By: Rolla Plate, M.D.    Nm Pet Image Initial (pi) Skull Base To Thigh  10/27/2012  *RADIOLOGY REPORT*  Clinical Data: Initial treatment strategy for high risk of breast cancer. Triple negative  NUCLEAR MEDICINE PET SKULL BASE TO THIGH  Fasting Blood Glucose:  97  Technique:  17.9 mCi F-18 FDG was injected intravenously. CT data was obtained and used for attenuation correction and anatomic localization only.  (This was not acquired as a diagnostic CT examination.) Additional exam technical data entered on technologist worksheet.  Comparison:  Findings:  Neck: No hypermetabolic lymph nodes in the neck.  Chest:  There is intense metabolic activity ( SUV max = 40.9) associated with a large portion of the glandular tissue of the left breast consistent with carcinoma.  There are intensely hypermetabolic left axillary lymph nodes with SUV max = 15.1.  These nodes are rounded and measure up to 2 cm. Hypermetabolic nodes extend to the sub pectoralis nodal station (image 71).  There  is a single hypermetabolic internal mammary lymph node measuring 5 mm just left of the sternum (image 87).  This has intense metabolic activity for size with SUV max = 7.1.  No hypermetabolic mediastinal lymph nodes.  No hypermetabolic pulmonary nodules.  Abdomen/Pelvis:  No abnormal hypermetabolic activity within the liver, pancreas, adrenal glands, or spleen.  No hypermetabolic lymph nodes in the abdomen or pelvis.  Skeleton:  No focal hypermetabolic activity to suggest skeletal metastasis.  IMPRESSION:  1.  Hypermetabolic breast tissue on the left consistent with primary carcinoma. 2.  Hypermetabolic nodal metastasis in the left axilla extending to the sub pectoralis nodes. 3.  Single hypermetabolic left internal mammary lymph node.  4.  No evidence of pulmonary metastasis.  5.  No evidence of metastasis beneath hemidiaphragms or within the skeleton.   Original Report Authenticated By: Genevive Bi, M.D.    Dg Chest Port 1 View  10/28/2012  *RADIOLOGY REPORT*  Clinical Data: Port-A-Cath placement.  PORTABLE CHEST - 1 VIEW  Comparison: None.  Findings: The patient is rotated on the study.  Right side Port-A- Cath is in place.  Tubing appears intact.  Tip of the catheter projects in the mid to lower superior vena cava.  No pneumothorax identified.  Heart size is normal.  Lungs are clear.  The patient is status post right mastectomy and axillary dissection.  IMPRESSION: Tip of Port-A-Cath projects over the mid to lower superior vena cava.  The patient is rotated on the study.  Exact position of the tip of the catheter could be determined with the lateral film. No pneumothorax.   Original Report  Authenticated By: Holley Dexter, M.D.    Dg Fluoro Guide Cv Line-no Report  10/28/2012  CLINICAL DATA: [facility]   FLOURO GUIDE CV LINE  Fluoroscopy was utilized by the requesting physician.  No radiographic  interpretation.      ASSESSMENT:   57 year old female with  #1 history of breast cancer of the right  breast status post mastectomy when she was 57 years old. Due to this she was referred for genetic testing.   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 Patient's tumor is ER negative PR negative HER-2/neu negative with a Ki-67 of 21%. Patient is being seen in medical oncology for neoadjuvant chemotherapy. Patient and I discussed her pathology in detail. We discussed treatment for breast cancer including surgical and with chemotherapy. Patient understands that she may still need a mastectomy however by giving her neoadjuvant chemotherapy her surgery may be made a little bit easier hopefully with giving her negative margins. She understands that neoadjuvant chemotherapy will also treat her distant disease.I discussed type of chemotherapy that she should have. This would include dose dense FEC x6 cycles followed by weekly Taxol carboplatinum for 12 weeks.  #3 Patient underwent staging studies with PET/CT, and received echo, port placement and chemotherapy class.    #4 Open port wound, healing  PLAN:   1. Doing well.  Labs are stable after chemotherapy.  She will continue to use her saltwater rinses for her mouth.  No lesions seen inside her mouth.  2. Ms. Solow will return next week for labs, an appt, and chemotherapy.   All questions were answered. The patient knows to call the clinic with any problems, questions or concerns. We can certainly see the patient much sooner if necessary.  I spent 25 minutes counseling the patient face to face. The total time spent in the appointment was 30 minutes.   Cherie Ouch Lyn Hollingshead, NP Medical Oncology [facility] Phone: [phone] 01/04/2013, 2:28 PM

## 2013-01-04 NOTE — Progress Notes (Signed)
OFFICE PROGRESS NOTE  CC**  Christine Mew, MD [address] 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   PRIOR THERAPY:  1. Patient has history of right breast cancer patient underwent a mastectomy when she was 57 years old. This occurred during her pregnancy. After that patient did not take any kind of further treatments. She continued to do well.   2.Recently she noted changes in her nipple had become itchy and retracted. Because of this she underwent mammogram with ultrasound that showed 2 areas in the left breast one at 12:00 position and a second at 3:00 position. She also had an ultrasound performed that showed the diameter to be at least 6 cm and multiple left axillary lymph nodes that were abnormal. She then went on to have a biopsy performed on 09/30/2012. 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 Patient went on to have MRIs of the breasts performed and was noted to be marked diffuse enhancement involving majority of the left breast measuring 13.0 x 12.5 x 9.5 cm. There was diffuse skin thickening and dermal enhancement. Multiple enlarged level I left axillary lymph nodes. The largest lymph node measured 2.4 cm in size. The large area of enhancement within the left breast extends posteriorly to abut the anterior portion of the pectoralis muscle. There was no evidence of internal mammary adenopathy or right axillary adenopathy.  PET/CT did show the left breast mass, and left axillary and subpectoralis nodal metastases, but no further areas throughout the chest/abd/pelvis of metastases.   3.  Patient currently undergoing  neoadjuvant chemotherapy with FEC x 6 cycles starting on 11/02/12, which will be followed by 12 cycles of weekly Taxol/Carbo.  CURRENT THERAPY:FEC cycle 4 day 8  INTERVAL HISTORY: Christine [name] 57 y.o. female returns for follow up today.  She is doing well today. She is fatigued, has mouth pain and ulcers, and odynophagia.  She also has vaginal burning and itching and she has gotten her fluconazole refilled.   MEDICAL HISTORY: Past Medical History  Diagnosis Date  . ALLERGIC RHINITIS   . GERD (gastroesophageal reflux disease)   . Hypertension   . Hypothyroidism   . Hx of colonic polyps   . Wears glasses   . Breast cancer 50, age 38    right  . Breast cancer 09/30/12    left, ER/PR -, Her 2 -    ALLERGIES:  is allergic to sulfa antibiotics and sulfonamide derivatives.  MEDICATIONS:  Current Outpatient Prescriptions  Medication Sig Dispense Refill  . dexamethasone (DECADRON) 4 MG tablet Take 2 tablets by mouth once a day on the day after chemotherapy and then take 2 tablets two times a day for 2 days. Take with food.  30 tablet  1  . diltiazem (CARDIZEM CD) 240 MG 24 hr capsule Take 1 capsule (240 mg total) by mouth daily.  90 capsule  2  . doxycycline (VIBRA-TABS) 100 MG tablet Take 1 tablet (100 mg total) by mouth 2 (two) times daily.  28 tablet  2  . fluconazole (DIFLUCAN) 200 MG tablet Take 1 tablet by mouth every other day.      . hydrochlorothiazide (MICROZIDE) 12.5 MG capsule  TAKE 1 CAPSULE (12.5 MG TOTAL) BY MOUTH EVERY MORNING.  90 capsule  2  . latanoprost (XALATAN) 0.005 % ophthalmic solution Place 1 drop into both eyes at bedtime.       Marland Kitchen levothyroxine (SYNTHROID, LEVOTHROID) 50 MCG tablet TAKE 1 TABLET (50 MCG TOTAL) BY MOUTH DAILY.  90 tablet  2  . lidocaine-prilocaine (EMLA) cream Apply topically as needed.  30 g  6  . LORazepam (ATIVAN) 0.5 MG tablet Take 1 tablet (0.5 mg total) by mouth every 6 (six) hours as needed (Nausea or vomiting).  30 tablet  0  . NEXIUM 40 MG  capsule TAKE 1 CAPSULE (40 MG TOTAL) BY MOUTH DAILY BEFORE BREAKFAST.  90 capsule  1  . nystatin (MYCOSTATIN) 100000 UNIT/ML suspension TAKE 5 MLS BY MOUTH 4 (FOUR) TIMES DAILY.  180 mL  0  . ondansetron (ZOFRAN) 8 MG tablet Take 1 tablet (8 mg total) by mouth 2 (two) times daily as needed. Take two times a day as needed for nausea or vomiting starting on the third day after chemotherapy.  30 tablet  1  . PATADAY 0.2 % SOLN       . prochlorperazine (COMPAZINE) 10 MG tablet Take 1 tablet (10 mg total) by mouth every 6 (six) hours as needed (Nausea or vomiting).  30 tablet  1  . prochlorperazine (COMPAZINE) 25 MG suppository Place 1 suppository (25 mg total) rectally every 12 (twelve) hours as needed for nausea.  12 suppository  3   No current facility-administered medications for this visit.    SURGICAL HISTORY:  Past Surgical History  Procedure Laterality Date  . Tonsillectomy    . Caesarean section      x 1  . Mastectomy  04/1987    right side  . Abdominal hysterectomy  06/1988    fibroids  . Portacath placement Right 10/28/2012    Procedure: PORT PLACEMENT;  Surgeon: Clovis Pu. Cornett, MD;  Location: [facility];  Service: General;  Laterality: Right;  Right Subclavian Vein    REVIEW OF SYSTEMS:  General: fatigue (+), night sweats (-), fever (-), pain (-) Lymph: palpable nodes (-) HEENT: vision changes (-), mucositis (-), gum bleeding (-), epistaxis (-) Cardiovascular: chest pain (-), palpitations (-) Pulmonary: shortness of breath (-), dyspnea on exertion (-), cough (-), hemoptysis (-) GI:  Early satiety (-), melena (-), dysphagia (-), nausea/vomiting (-), diarrhea (-) GU: dysuria (-), hematuria (-), incontinence (-) Musculoskeletal: joint swelling (-), joint pain (-), back pain (-) Neuro: weakness (-), numbness (-), headache (-), confusion (-) Skin: Rash (-), lesions (-), dryness (-) Psych: depression (-), suicidal/homicidal ideation (-), feeling of hopelessness  (-)   PHYSICAL EXAMINATION: Blood pressure 118/74, pulse 97, temperature 98.5 F (36.9 C), temperature source Oral, resp. rate 20, height 5\' 4"  (1.626 m), weight 212 lb 3.2 oz (96.253 kg). Body mass index is 36.41 kg/(m^2). General: Patient is a well appearing female in no acute distress HEENT: PERRLA, sclerae anicteric no conjunctival pallor, ulcers on lower lip and on tongue Neck: supple, no palpable adenopathy Lungs: clear to auscultation bilaterally, no wheezes, rhonchi, or rales Cardiovascular: regular rate rhythm, S1, S2, no murmurs, rubs or gallops Abdomen: Soft, non-tender, non-distended, normoactive bowel sounds, no HSM Extremities: warm and well perfused, no clubbing, cyanosis, or edema Skin: No rashes or lesions Neuro: Non-focal Breasts: Right mastectomy site without nodularity or sign of recurrence.  Left breast mass palpable along with dimpling of skin and inversion of the nipple--softer.  Right chest wall  port with open incision site.  Yellow tissue at wound bed, no erythema, tenderness, warmth, or exudate.  ECOG PERFORMANCE STATUS: 1 - Symptomatic but completely ambulatory  LABORATORY DATA: Lab Results  Component Value Date   WBC 1.9* 01/04/2013   HGB 10.6* 01/04/2013   HCT 30.9* 01/04/2013   MCV 91.5 01/04/2013   PLT 83* 01/04/2013      Chemistry      Component Value Date/Time   NA 143 12/28/2012 1351   NA 144 10/28/2012 1248   K 3.3* 12/28/2012 1351   K 3.9 10/28/2012 1248   CL 103 12/28/2012 1351   CL 108 10/28/2012 1248   CO2 28 12/28/2012 1351   CO2 29 09/13/2011 1910   BUN 11.5 12/28/2012 1351   BUN 10 10/28/2012 1248   CREATININE 0.8 12/28/2012 1351   CREATININE 0.80 10/28/2012 1248      Component Value Date/Time   CALCIUM 9.1 12/28/2012 1351   CALCIUM 9.4 09/13/2011 1910   ALKPHOS 95 12/28/2012 1351   ALKPHOS 78 09/13/2011 1910   AST 24 12/28/2012 1351   AST 18 09/13/2011 1910   ALT 21 12/28/2012 1351   ALT 12 09/13/2011 1910   BILITOT 0.33 12/28/2012 1351   BILITOT 0.3  09/13/2011 1910       RADIOGRAPHIC STUDIES:  Ct Chest W Contrast  10/27/2012  *RADIOLOGY REPORT*  Clinical Data:  High risk breast cancer staging.  Right breast cancer diagnosed 1988.  Left breast cancer diagnosed February 2014.  CT CHEST, ABDOMEN AND PELVIS WITH CONTRAST  Technique:  Multidetector CT imaging of the chest, abdomen and pelvis was performed following the standard protocol during bolus administration of intravenous contrast.  Contrast: OMNIPAQUE IOHEXOL 300 MG/ML  SOLN  Comparison:  PET CT scan 03/26 1014  CT CHEST  Findings:  Right breast mastectomy anatomy.  There is thickening of the skin of the left breast to 11 mm.  There is a mass within the deep posterior left breast measuring 29 x 30 mm (image 6).  There are enlarged round abnormal lymph nodes in the left axilla measuring up to 20 mm short axis (image 22).  Lymph nodes extend beneath the sub pectoralis muscles on the left (image 12).  There is no evidence of internal mammary lymphadenopathy.  No infraclavicular adenopathy.  No mediastinal adenopathy.  No pericardial fluid.  No suspicious pulmonary nodules.  IMPRESSION:  1.  Left breast mass consistent with breast carcinoma. 2.  Skin thickening over the left breast is concerning for inflammatory breast cancer. 3.  Nodal metastasis in the left axilla and sub pectoralis nodal stations. 4.  No evidence of central nodal metastasis or mediastinal metastasis  CT ABDOMEN AND PELVIS  Findings:  No focal hepatic lesion.  The gallbladder, pancreas, spleen, adrenal gland is, and kidneys are normal.  The stomach, small bowel, appendix, cecum are normal.  The colon and rectosigmoid colon are normal.  Abdominal aorta is normal caliber.  No retroperitoneal or periportal lymphadenopathy.  No mesenteric or peritoneal disease.  No free fluid in  the pelvis.  Post hysterectomy anatomy.  No pelvic lymphadenopathy. Review of  bone windows demonstrates no aggressive osseous lesions.  IMPRESSION:  No  evidence of metastasis in the abdomen or pelvis.   Original Report Authenticated By: Genevive Bi, M.D.    Ct Abdomen Pelvis W Contrast  10/27/2012  *RADIOLOGY REPORT*  Clinical Data:  High risk breast cancer staging.  Right breast cancer diagnosed 1988.  Left breast cancer diagnosed February 2014.  CT CHEST, ABDOMEN AND PELVIS WITH CONTRAST  Technique:  Multidetector CT imaging of the chest, abdomen and pelvis was performed following the standard protocol during bolus administration of intravenous contrast.  Contrast: OMNIPAQUE IOHEXOL 300 MG/ML  SOLN  Comparison:  PET CT scan 03/26 1014  CT CHEST  Findings:  Right breast mastectomy anatomy.  There is thickening of the skin of the left breast to 11 mm.  There is a mass within the deep posterior left breast measuring 29 x 30 mm (image 6).  There are enlarged round abnormal lymph nodes in the left axilla measuring up to 20 mm short axis (image 22).  Lymph nodes extend beneath the sub pectoralis muscles on the left (image 12).  There is no evidence of internal mammary lymphadenopathy.  No infraclavicular adenopathy.  No mediastinal adenopathy.  No pericardial fluid.  No suspicious pulmonary nodules.  IMPRESSION:  1.  Left breast mass consistent with breast carcinoma. 2.  Skin thickening over the left breast is concerning for inflammatory breast cancer. 3.  Nodal metastasis in the left axilla and sub pectoralis nodal stations. 4.  No evidence of central nodal metastasis or mediastinal metastasis  CT ABDOMEN AND PELVIS  Findings:  No focal hepatic lesion.  The gallbladder, pancreas, spleen, adrenal gland is, and kidneys are normal.  The stomach, small bowel, appendix, cecum are normal.  The colon and rectosigmoid colon are normal.  Abdominal aorta is normal caliber.  No retroperitoneal or periportal lymphadenopathy.  No mesenteric or peritoneal disease.  No free fluid in  the pelvis.  Post hysterectomy anatomy.  No pelvic lymphadenopathy. Review of  bone  windows demonstrates no aggressive osseous lesions.  IMPRESSION:  No evidence of metastasis in the abdomen or pelvis.   Original Report Authenticated By: Genevive Bi, M.D.    Mr Breast Bilateral W Wo Contrast  10/12/2012  *RADIOLOGY REPORT*  Clinical Data: History of previous malignant mastectomy of the right breast 25 years ago.  The patient developed diffuse skin thickening and firmness within the left breast.  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  BUN and creatinine were obtained on site at [facility] at [address] Results:  BUN 7 mg/dL,  Creatinine 1.0 mg/dL.  BILATERAL BREAST MRI WITH AND WITHOUT CONTRAST  Technique: Multiplanar, multisequence MR images of both breasts were obtained prior to and following the intravenous administration of 20ml of Multihance.  Three dimensional images were evaluated at the independent DynaCad workstation.  Comparison:  Mammograms dated 09/30/2012 and 09/28/2012.  Findings: There has been a previous right mastectomy.  There is marked diffuse enhancement involving the majority of the left breast.  This measures 13.0 x 12.5 x 9.5 cm in size.  This is associated with a mixture of plateau and washout enhancement kinetics.  In addition,  there is diffuse skin thickening and dermal enhancement.  There are multiple enlarged level I left axillary lymph nodes.  The largest lymph node measures 2.4 cm in size.  The large area of enhancement within the left breast extends posteriorly to abut the anterior portion of the pectoral muscle. There is no evidence for internal mammary adenopathy or right axillary adenopathy.  There are no additional findings.  IMPRESSION:  1.  Large ( 13 cm) area of enhancement involving the majority of the left breast consistent with the patient's known invasive ductal carcinoma and DCIS. This extends posteriorly  to abut the pectoralis muscle. 2.  Multiple enlarged level I left axillary lymph nodes. 3.  Previous right mastectomy.  RECOMMENDATION: Treatment plan  THREE-DIMENSIONAL MR IMAGE RENDERING ON INDEPENDENT WORKSTATION:  Three-dimensional MR images were rendered by post-processing of the original MR data on an independent workstation.  The three- dimensional MR images were interpreted, and findings were reported in the accompanying complete MRI report for this study.  BI-RADS CATEGORY 6:  Known biopsy-proven malignancy - appropriate action should be taken.   Original Report Authenticated By: Rolla Plate, M.D.    Nm Pet Image Initial (pi) Skull Base To Thigh  10/27/2012  *RADIOLOGY REPORT*  Clinical Data: Initial treatment strategy for high risk of breast cancer. Triple negative  NUCLEAR MEDICINE PET SKULL BASE TO THIGH  Fasting Blood Glucose:  97  Technique:  17.9 mCi F-18 FDG was injected intravenously. CT data was obtained and used for attenuation correction and anatomic localization only.  (This was not acquired as a diagnostic CT examination.) Additional exam technical data entered on technologist worksheet.  Comparison:  Findings:  Neck: No hypermetabolic lymph nodes in the neck.  Chest:  There is intense metabolic activity ( SUV max = 16.1) associated with a large portion of the glandular tissue of the left breast consistent with carcinoma.  There are intensely hypermetabolic left axillary lymph nodes with SUV max = 15.1.  These nodes are rounded and measure up to 2 cm. Hypermetabolic nodes extend to the sub pectoralis nodal station (image 71).  There is a single hypermetabolic internal mammary lymph node measuring 5 mm just left of the sternum (image 87).  This has intense metabolic activity for size with SUV max = 7.1.  No hypermetabolic mediastinal lymph nodes.  No hypermetabolic pulmonary nodules.  Abdomen/Pelvis:  No abnormal hypermetabolic activity within the liver, pancreas, adrenal glands, or  spleen.  No hypermetabolic lymph nodes in the abdomen or pelvis.  Skeleton:  No focal hypermetabolic activity to suggest skeletal metastasis.  IMPRESSION:  1.  Hypermetabolic breast tissue on the left consistent with primary carcinoma. 2.  Hypermetabolic nodal metastasis in the left axilla extending to the sub pectoralis nodes. 3.  Single hypermetabolic left internal mammary lymph node.  4.  No evidence of pulmonary metastasis.  5.  No evidence of metastasis beneath hemidiaphragms or within the skeleton.   Original Report Authenticated By: Genevive Bi, M.D.    Dg Chest Port 1 View  10/28/2012  *RADIOLOGY REPORT*  Clinical Data: Port-A-Cath placement.  PORTABLE CHEST - 1 VIEW  Comparison: None.  Findings: The patient is rotated on the study.  Right side Port-A- Cath is in place.  Tubing appears intact.  Tip of the catheter projects in the mid to lower superior vena cava.  No pneumothorax identified.  Heart size is normal.  Lungs are clear.  The patient is status post right mastectomy and axillary dissection.  IMPRESSION: Tip of Port-A-Cath projects over the mid to lower superior vena cava.  The patient is rotated on the study.  Exact position of the tip of the catheter could be determined with the lateral film. No pneumothorax.   Original Report Authenticated By: Holley Dexter, M.D.    Dg Fluoro Guide Cv Line-no Report  10/28/2012  CLINICAL DATA: [facility]   FLOURO GUIDE CV LINE  Fluoroscopy was utilized by the requesting physician.  No radiographic  interpretation.      ASSESSMENT:   57 year old female with  #1 history of breast cancer of the right breast status post mastectomy  when she was 57 years old. Due to this she was referred for genetic testing.   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 Patient's tumor is ER negative PR negative HER-2/neu negative with a Ki-67 of 21%. Patient is  being seen in medical oncology for neoadjuvant chemotherapy. Patient and I discussed her pathology in detail. We discussed treatment for breast cancer including surgical and with chemotherapy. Patient understands that she may still need a mastectomy however by giving her neoadjuvant chemotherapy her surgery may be made a little bit easier hopefully with giving her negative margins. She understands that neoadjuvant chemotherapy will also treat her distant disease.I discussed type of chemotherapy that she should have. This would include dose dense FEC x6 cycles followed by weekly Taxol carboplatinum for 12 weeks.  #3 Patient underwent staging studies with PET/CT, and received echo, port placement and chemotherapy class.    #4 Mucositis  #5 Hypokalemia.   #6 Neuropathy  PLAN:   1. Doing well.  Labs are stable after chemotherapy.  She is not feeling well today.  I prescribed Magic mouthwash for her mouth and throat pain, 5ml sw and swallow four times a day, she will take Fluconazole as directed.    2. Ms. Kumagai will return next week for labs, an appt, and chemotherapy.  3. She will take Kdur as prescribed.    #4 She will take Neurontin 100 mg TID for the numbness in her right hand.  I discussed risks and benefits with her and gave her additional information about the medication in her AVS.    All questions were answered. The patient knows to call the clinic with any problems, questions or concerns. We can certainly see the patient much sooner if necessary.  I spent 25 minutes counseling the patient face to face. The total time spent in the appointment was 30 minutes.   Cherie Ouch Lyn Hollingshead, NP Medical Oncology [facility] Phone: [phone] 01/04/2013, 2:20 PM

## 2013-01-04 NOTE — Patient Instructions (Addendum)
We will give you magic mouthwash to either swish and spit or swish and swallow four times per day.  Continue on Fluconazole for the vaginal burning and itching.    Gabapentin capsules or tablets What is this medicine? GABAPENTIN (GA ba pen tin) is used to control partial seizures in adults with epilepsy. It is also used to treat certain types of nerve pain. This medicine may be used for other purposes; ask your health care provider or pharmacist if you have questions. What should I tell my health care provider before I take this medicine? They need to know if you have any of these conditions: -kidney disease -suicidal thoughts, plans, or attempt; a previous suicide attempt by you or a family member -an unusual or allergic reaction to gabapentin, other medicines, foods, dyes, or preservatives -pregnant or trying to get pregnant -breast-feeding How should I use this medicine? Take this medicine by mouth. Swallow it with a drink of water. Follow the directions on the prescription label. If this medicine upsets your stomach, take it with food or milk. Take your medicine at regular intervals. Do not take it more often than directed. If you are directed to break the 600 or 800 mg tablets in half as part of your dose, the extra half tablet should be used for the next dose. If you have not used the extra half tablet within 3 days, it should be thrown away. A special MedGuide will be given to you by the pharmacist with each prescription and refill. Be sure to read this information carefully each time. Talk to your pediatrician regarding the use of this medicine in children. Special care may be needed. Overdosage: If you think you have taken too much of this medicine contact a poison control center or emergency room at once. NOTE: This medicine is only for you. Do not share this medicine with others. What if I miss a dose? If you miss a dose, take it as soon as you can. If it is almost time for your next  dose, take only that dose. Do not take double or extra doses. What may interact with this medicine? -antacids -hydrocodone -morphine -naproxen -sevelamer This list may not describe all possible interactions. Give your health care provider a list of all the medicines, herbs, non-prescription drugs, or dietary supplements you use. Also tell them if you smoke, drink alcohol, or use illegal drugs. Some items may interact with your medicine. What should I watch for while using this medicine? Visit your doctor or health care professional for regular checks on your progress. You may want to keep a record at home of how you feel your condition is responding to treatment. You may want to share this information with your doctor or health care professional at each visit. You should contact your doctor or health care professional if your seizures get worse or if you have any new types of seizures. Do not stop taking this medicine or any of your seizure medicines unless instructed by your doctor or health care professional. Stopping your medicine suddenly can increase your seizures or their severity. Wear a medical identification bracelet or chain if you are taking this medicine for seizures, and carry a card that lists all your medications. You may get drowsy, dizzy, or have blurred vision. Do not drive, use machinery, or do anything that needs mental alertness until you know how this medicine affects you. To reduce dizzy or fainting spells, do not sit or stand up quickly, especially if you  are an older patient. Alcohol can increase drowsiness and dizziness. Avoid alcoholic drinks. Your mouth may get dry. Chewing sugarless gum or sucking hard candy, and drinking plenty of water will help. The use of this medicine may increase the chance of suicidal thoughts or actions. Pay special attention to how you are responding while on this medicine. Any worsening of mood, or thoughts of suicide or dying should be reported to  your health care professional right away. Women who become pregnant while using this medicine may enroll in the [place] [employer] Antiepileptic Drug Pregnancy Registry by calling [phone]. This registry collects information about the safety of antiepileptic drug use during pregnancy. What side effects may I notice from receiving this medicine? Side effects that you should report to your doctor or health care professional as soon as possible: -allergic reactions like skin rash, itching or hives, swelling of the face, lips, or tongue -worsening of mood, thoughts or actions of suicide or dying Side effects that usually do not require medical attention (report to your doctor or health care professional if they continue or are bothersome): -constipation -difficulty walking or controlling muscle movements -nausea -slurred speech -tremors -weight gain This list may not describe all possible side effects. Call your doctor for medical advice about side effects. You may report side effects to FDA at 1-800-FDA-1088. Where should I keep my medicine? Keep out of reach of children. Store at room temperature between 15 and 30 degrees C (59 and 86 degrees F). Throw away any unused medicine after the expiration date. NOTE: This sheet is a summary. It may not cover all possible information. If you have questions about this medicine, talk to your doctor, pharmacist, or health care provider.  2012, [employer]. (03/19/2010 6:06:26 PM)

## 2013-01-05 ENCOUNTER — Telehealth: Payer: Self-pay | Admitting: *Deleted

## 2013-01-05 NOTE — Telephone Encounter (Signed)
Per staff message and POF I have scheduled appts for 7/8.  JMW

## 2013-01-05 NOTE — Telephone Encounter (Signed)
Per staff message and POF I have scheduled appts. All appts in except for 7/8. The MD appt is to late for ist time treatment. Message sent back to the scheduler.  JMW

## 2013-01-11 ENCOUNTER — Ambulatory Visit (HOSPITAL_BASED_OUTPATIENT_CLINIC_OR_DEPARTMENT_OTHER): Payer: BC Managed Care – PPO | Admitting: Adult Health

## 2013-01-11 ENCOUNTER — Encounter: Payer: Self-pay | Admitting: Adult Health

## 2013-01-11 ENCOUNTER — Telehealth: Payer: Self-pay

## 2013-01-11 ENCOUNTER — Other Ambulatory Visit (HOSPITAL_BASED_OUTPATIENT_CLINIC_OR_DEPARTMENT_OTHER): Payer: BC Managed Care – PPO | Admitting: Lab

## 2013-01-11 ENCOUNTER — Ambulatory Visit (HOSPITAL_BASED_OUTPATIENT_CLINIC_OR_DEPARTMENT_OTHER): Payer: BC Managed Care – PPO

## 2013-01-11 VITALS — BP 148/89 | HR 105 | Temp 98.2°F | Resp 20 | Ht 64.0 in | Wt 210.7 lb

## 2013-01-11 DIAGNOSIS — Z5111 Encounter for antineoplastic chemotherapy: Secondary | ICD-10-CM

## 2013-01-11 DIAGNOSIS — C50912 Malignant neoplasm of unspecified site of left female breast: Secondary | ICD-10-CM

## 2013-01-11 DIAGNOSIS — C50919 Malignant neoplasm of unspecified site of unspecified female breast: Secondary | ICD-10-CM

## 2013-01-11 DIAGNOSIS — C773 Secondary and unspecified malignant neoplasm of axilla and upper limb lymph nodes: Secondary | ICD-10-CM

## 2013-01-11 DIAGNOSIS — Z171 Estrogen receptor negative status [ER-]: Secondary | ICD-10-CM

## 2013-01-11 DIAGNOSIS — Z853 Personal history of malignant neoplasm of breast: Secondary | ICD-10-CM

## 2013-01-11 LAB — COMPREHENSIVE METABOLIC PANEL (CC13)
Alkaline Phosphatase: 105 U/L (ref 40–150)
BUN: 12.7 mg/dL (ref 7.0–26.0)
Glucose: 148 mg/dl — ABNORMAL HIGH (ref 70–99)
Sodium: 142 mEq/L (ref 136–145)
Total Bilirubin: 0.25 mg/dL (ref 0.20–1.20)

## 2013-01-11 LAB — CBC WITH DIFFERENTIAL/PLATELET
Basophils Absolute: 0 10*3/uL (ref 0.0–0.1)
EOS%: 0 % (ref 0.0–7.0)
HGB: 10 g/dL — ABNORMAL LOW (ref 11.6–15.9)
MCH: 30.9 pg (ref 25.1–34.0)
MCV: 94.4 fL (ref 79.5–101.0)
MONO%: 10.6 % (ref 0.0–14.0)
NEUT%: 67.2 % (ref 38.4–76.8)
RDW: 16 % — ABNORMAL HIGH (ref 11.2–14.5)

## 2013-01-11 MED ORDER — HEPARIN SOD (PORK) LOCK FLUSH 100 UNIT/ML IV SOLN
500.0000 [IU] | Freq: Once | INTRAVENOUS | Status: AC | PRN
  Administered 2013-01-11: 500 [IU]
  Filled 2013-01-11: qty 5

## 2013-01-11 MED ORDER — SODIUM CHLORIDE 0.9 % IV SOLN
150.0000 mg | Freq: Once | INTRAVENOUS | Status: AC
  Administered 2013-01-11: 150 mg via INTRAVENOUS
  Filled 2013-01-11: qty 5

## 2013-01-11 MED ORDER — SODIUM CHLORIDE 0.9 % IV SOLN
Freq: Once | INTRAVENOUS | Status: AC
  Administered 2013-01-11: 16:00:00 via INTRAVENOUS

## 2013-01-11 MED ORDER — EPIRUBICIN HCL CHEMO IV INJECTION 200 MG/100ML
100.0000 mg/m2 | Freq: Once | INTRAVENOUS | Status: AC
  Administered 2013-01-11: 214 mg via INTRAVENOUS
  Filled 2013-01-11: qty 107

## 2013-01-11 MED ORDER — PALONOSETRON HCL INJECTION 0.25 MG/5ML
0.2500 mg | Freq: Once | INTRAVENOUS | Status: AC
  Administered 2013-01-11: 0.25 mg via INTRAVENOUS

## 2013-01-11 MED ORDER — FLUOROURACIL CHEMO INJECTION 2.5 GM/50ML
500.0000 mg/m2 | Freq: Once | INTRAVENOUS | Status: AC
  Administered 2013-01-11: 1050 mg via INTRAVENOUS
  Filled 2013-01-11: qty 21

## 2013-01-11 MED ORDER — SODIUM CHLORIDE 0.9 % IV SOLN
500.0000 mg/m2 | Freq: Once | INTRAVENOUS | Status: AC
  Administered 2013-01-11: 1080 mg via INTRAVENOUS
  Filled 2013-01-11: qty 54

## 2013-01-11 MED ORDER — SODIUM CHLORIDE 0.9 % IJ SOLN
10.0000 mL | INTRAMUSCULAR | Status: DC | PRN
  Administered 2013-01-11: 10 mL
  Filled 2013-01-11: qty 10

## 2013-01-11 MED ORDER — DEXAMETHASONE SODIUM PHOSPHATE 20 MG/5ML IJ SOLN
12.0000 mg | Freq: Once | INTRAMUSCULAR | Status: AC
  Administered 2013-01-11: 12 mg via INTRAVENOUS

## 2013-01-11 NOTE — Progress Notes (Signed)
OFFICE PROGRESS NOTE  CC**  Christine Mew, MD [address] 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   PRIOR THERAPY:  1. Patient has history of right breast cancer patient underwent a mastectomy when she was 58 years old. This occurred during her pregnancy. After that patient did not take any kind of further treatments. She continued to do well.   2.Recently she noted changes in her nipple had become itchy and retracted. Because of this she underwent mammogram with ultrasound that showed 2 areas in the left breast one at 12:00 position and a second at 3:00 position. She also had an ultrasound performed that showed the diameter to be at least 6 cm and multiple left axillary lymph nodes that were abnormal. She then went on to have a biopsy performed on 09/30/2012. 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 Patient went on to have MRIs of the breasts performed and was noted to be marked diffuse enhancement involving majority of the left breast measuring 13.0 x 12.5 x 9.5 cm. There was diffuse skin thickening and dermal enhancement. Multiple enlarged level I left axillary lymph nodes. The largest lymph node measured 2.4 cm in size. The large area of enhancement within the left breast extends posteriorly to abut the anterior portion of the pectoralis muscle. There was no evidence of internal mammary adenopathy or right axillary adenopathy.  PET/CT did show the left breast mass, and left axillary and subpectoralis nodal metastases, but no further areas throughout the chest/abd/pelvis of metastases.   3.  Patient currently undergoing  neoadjuvant chemotherapy with FEC x 6 cycles starting on 11/02/12, which will be followed by 12 cycles of weekly Taxol/Carbo.  CURRENT THERAPY:FEC cycle 5 day 1  INTERVAL HISTORY: Christine [name] 57 y.o. female returns for follow up today.  She is feeling better today.  She has mild blurred vision, and itching in her eyes, and mouth ulcers.  Her mouth ulcers are much improved with magic mouthwash.  She denies fevers, chills, nausea, vomiting, constipation, diarrhea, numbness, or any further concerns.    MEDICAL HISTORY: Past Medical History  Diagnosis Date  . ALLERGIC RHINITIS   . GERD (gastroesophageal reflux disease)   . Hypertension   . Hypothyroidism   . Hx of colonic polyps   . Wears glasses   . Breast cancer 11, age 33    right  . Breast cancer 09/30/12    left, ER/PR -, Her 2 -    ALLERGIES:  is allergic to sulfa antibiotics and sulfonamide derivatives.  MEDICATIONS:  Current Outpatient Prescriptions  Medication Sig Dispense Refill  . dexamethasone (DECADRON) 4 MG tablet Take 2 tablets by mouth once a day on the day after chemotherapy and then take 2 tablets two times a day for 2 days. Take with food.  30 tablet  1  . diltiazem (CARDIZEM CD) 240 MG 24 hr capsule Take 1 capsule (240 mg total) by mouth daily.  90 capsule  2  . doxycycline (VIBRA-TABS) 100 MG tablet Take 1 tablet (100 mg total) by mouth 2 (two) times daily.  28 tablet  2  . fluconazole (DIFLUCAN) 200 MG tablet Take 1 tablet by mouth  every other day.      . gabapentin (NEURONTIN) 100 MG capsule Take 1 capsule (100 mg total) by mouth 3 (three) times daily.  90 capsule  2  . hydrochlorothiazide (MICROZIDE) 12.5 MG capsule TAKE 1 CAPSULE (12.5 MG TOTAL) BY MOUTH EVERY MORNING.  90 capsule  2  . latanoprost (XALATAN) 0.005 % ophthalmic solution Place 1 drop into both eyes at bedtime.       Marland Kitchen levothyroxine (SYNTHROID, LEVOTHROID) 50 MCG tablet TAKE 1 TABLET (50 MCG TOTAL) BY MOUTH DAILY.  90 tablet  2  .  lidocaine-prilocaine (EMLA) cream Apply topically as needed.  30 g  6  . LORazepam (ATIVAN) 0.5 MG tablet Take 1 tablet (0.5 mg total) by mouth every 6 (six) hours as needed (Nausea or vomiting).  30 tablet  0  . NEXIUM 40 MG capsule TAKE 1 CAPSULE (40 MG TOTAL) BY MOUTH DAILY BEFORE BREAKFAST.  90 capsule  1  . nystatin (MYCOSTATIN) 100000 UNIT/ML suspension TAKE 5 MLS BY MOUTH 4 (FOUR) TIMES DAILY.  180 mL  0  . ondansetron (ZOFRAN) 8 MG tablet Take 1 tablet (8 mg total) by mouth 2 (two) times daily as needed. Take two times a day as needed for nausea or vomiting starting on the third day after chemotherapy.  30 tablet  1  . PATADAY 0.2 % SOLN       . potassium chloride SA (KLOR-CON M20) 20 MEQ tablet Take 1 tablet (20 mEq total) by mouth daily. For 7 days  7 tablet  0  . prochlorperazine (COMPAZINE) 10 MG tablet Take 1 tablet (10 mg total) by mouth every 6 (six) hours as needed (Nausea or vomiting).  30 tablet  1  . prochlorperazine (COMPAZINE) 25 MG suppository Place 1 suppository (25 mg total) rectally every 12 (twelve) hours as needed for nausea.  12 suppository  3   No current facility-administered medications for this visit.    SURGICAL HISTORY:  Past Surgical History  Procedure Laterality Date  . Tonsillectomy    . Caesarean section      x 1  . Mastectomy  04/1987    right side  . Abdominal hysterectomy  06/1988    fibroids  . Portacath placement Right 10/28/2012    Procedure: PORT PLACEMENT;  Surgeon: Clovis Pu. Cornett, MD;  Location:  SURGERY CENTER;  Service: General;  Laterality: Right;  Right Subclavian Vein    REVIEW OF SYSTEMS:  General: fatigue (+), night sweats (-), fever (-), pain (-) Lymph: palpable nodes (-) HEENT: vision changes (-), mucositis (-), gum bleeding (-), epistaxis (-) Cardiovascular: chest pain (-), palpitations (-) Pulmonary: shortness of breath (-), dyspnea on exertion (-), cough (-), hemoptysis (-) GI:  Early satiety (-), melena (-),  dysphagia (-), nausea/vomiting (-), diarrhea (-) GU: dysuria (-), hematuria (-), incontinence (-) Musculoskeletal: joint swelling (-), joint pain (-), back pain (-) Neuro: weakness (-), numbness (-), headache (-), confusion (-) Skin: Rash (-), lesions (-), dryness (-) Psych: depression (-), suicidal/homicidal ideation (-), feeling of hopelessness (-)   PHYSICAL EXAMINATION: Blood pressure 148/89, pulse 105, temperature 98.2 F (36.8 C), temperature source Oral, resp. rate 20, height 5\' 4"  (1.626 m), weight 210 lb 11.2 oz (95.573 kg). Body mass index is 36.15 kg/(m^2). General: Patient is a well appearing female in no acute distress HEENT: PERRLA, sclerae anicteric no conjunctival pallor, MMM. Ulcer on left lateral tongue and right buccal mucosa Neck: supple, no palpable adenopathy Lungs: clear to auscultation bilaterally, no wheezes, rhonchi,  or rales Cardiovascular: regular rate rhythm, S1, S2, no murmurs, rubs or gallops Abdomen: Soft, non-tender, non-distended, normoactive bowel sounds, no HSM Extremities: warm and well perfused, no clubbing, cyanosis, or edema Skin: No rashes or lesions Neuro: Non-focal Breasts: Right mastectomy site without nodularity or sign of recurrence.  Left breast mass palpable along with dimpling of skin and inversion of the nipple--softer.  Right chest wall port now closed ECOG PERFORMANCE STATUS: 1 - Symptomatic but completely ambulatory  LABORATORY DATA: Lab Results  Component Value Date   WBC 10.0 01/11/2013   HGB 10.0* 01/11/2013   HCT 30.6* 01/11/2013   MCV 94.4 01/11/2013   PLT 143* 01/11/2013      Chemistry      Component Value Date/Time   NA 138 01/04/2013 1350   NA 144 10/28/2012 1248   K 3.0 Repeated and Verified* 01/04/2013 1350   K 3.9 10/28/2012 1248   CL 100 01/04/2013 1350   CL 108 10/28/2012 1248   CO2 27 01/04/2013 1350   CO2 29 09/13/2011 1910   BUN 15.8 01/04/2013 1350   BUN 10 10/28/2012 1248   CREATININE 0.8 01/04/2013 1350   CREATININE 0.80  10/28/2012 1248      Component Value Date/Time   CALCIUM 8.8 01/04/2013 1350   CALCIUM 9.4 09/13/2011 1910   ALKPHOS 105 01/04/2013 1350   ALKPHOS 78 09/13/2011 1910   AST 13 01/04/2013 1350   AST 18 09/13/2011 1910   ALT 21 01/04/2013 1350   ALT 12 09/13/2011 1910   BILITOT 0.47 01/04/2013 1350   BILITOT 0.3 09/13/2011 1910       RADIOGRAPHIC STUDIES:  Ct Chest W Contrast  10/27/2012  *RADIOLOGY REPORT*  Clinical Data:  High risk breast cancer staging.  Right breast cancer diagnosed 1988.  Left breast cancer diagnosed February 2014.  CT CHEST, ABDOMEN AND PELVIS WITH CONTRAST  Technique:  Multidetector CT imaging of the chest, abdomen and pelvis was performed following the standard protocol during bolus administration of intravenous contrast.  Contrast: OMNIPAQUE IOHEXOL 300 MG/ML  SOLN  Comparison:  PET CT scan 03/26 1014  CT CHEST  Findings:  Right breast mastectomy anatomy.  There is thickening of the skin of the left breast to 11 mm.  There is a mass within the deep posterior left breast measuring 29 x 30 mm (image 6).  There are enlarged round abnormal lymph nodes in the left axilla measuring up to 20 mm short axis (image 22).  Lymph nodes extend beneath the sub pectoralis muscles on the left (image 12).  There is no evidence of internal mammary lymphadenopathy.  No infraclavicular adenopathy.  No mediastinal adenopathy.  No pericardial fluid.  No suspicious pulmonary nodules.  IMPRESSION:  1.  Left breast mass consistent with breast carcinoma. 2.  Skin thickening over the left breast is concerning for inflammatory breast cancer. 3.  Nodal metastasis in the left axilla and sub pectoralis nodal stations. 4.  No evidence of central nodal metastasis or mediastinal metastasis  CT ABDOMEN AND PELVIS  Findings:  No focal hepatic lesion.  The gallbladder, pancreas, spleen, adrenal gland is, and kidneys are normal.  The stomach, small bowel, appendix, cecum are normal.  The colon and rectosigmoid colon are  normal.  Abdominal aorta is normal caliber.  No retroperitoneal or periportal lymphadenopathy.  No mesenteric or peritoneal disease.  No free fluid in  the pelvis.  Post hysterectomy anatomy.  No pelvic lymphadenopathy. Review of  bone windows demonstrates no aggressive osseous lesions.  IMPRESSION:  No evidence of metastasis in the abdomen or pelvis.   Original Report Authenticated By: Genevive Bi, M.D.    Ct Abdomen Pelvis W Contrast  10/27/2012  *RADIOLOGY REPORT*  Clinical Data:  High risk breast cancer staging.  Right breast cancer diagnosed 1988.  Left breast cancer diagnosed February 2014.  CT CHEST, ABDOMEN AND PELVIS WITH CONTRAST  Technique:  Multidetector CT imaging of the chest, abdomen and pelvis was performed following the standard protocol during bolus administration of intravenous contrast.  Contrast: OMNIPAQUE IOHEXOL 300 MG/ML  SOLN  Comparison:  PET CT scan 03/26 1014  CT CHEST  Findings:  Right breast mastectomy anatomy.  There is thickening of the skin of the left breast to 11 mm.  There is a mass within the deep posterior left breast measuring 29 x 30 mm (image 6).  There are enlarged round abnormal lymph nodes in the left axilla measuring up to 20 mm short axis (image 22).  Lymph nodes extend beneath the sub pectoralis muscles on the left (image 12).  There is no evidence of internal mammary lymphadenopathy.  No infraclavicular adenopathy.  No mediastinal adenopathy.  No pericardial fluid.  No suspicious pulmonary nodules.  IMPRESSION:  1.  Left breast mass consistent with breast carcinoma. 2.  Skin thickening over the left breast is concerning for inflammatory breast cancer. 3.  Nodal metastasis in the left axilla and sub pectoralis nodal stations. 4.  No evidence of central nodal metastasis or mediastinal metastasis  CT ABDOMEN AND PELVIS  Findings:  No focal hepatic lesion.  The gallbladder, pancreas, spleen, adrenal gland is, and kidneys are normal.  The stomach, small bowel,  appendix, cecum are normal.  The colon and rectosigmoid colon are normal.  Abdominal aorta is normal caliber.  No retroperitoneal or periportal lymphadenopathy.  No mesenteric or peritoneal disease.  No free fluid in  the pelvis.  Post hysterectomy anatomy.  No pelvic lymphadenopathy. Review of  bone windows demonstrates no aggressive osseous lesions.  IMPRESSION:  No evidence of metastasis in the abdomen or pelvis.   Original Report Authenticated By: Genevive Bi, M.D.    Mr Breast Bilateral W Wo Contrast  10/12/2012  *RADIOLOGY REPORT*  Clinical Data: History of previous malignant mastectomy of the right breast 25 years ago.  The patient developed diffuse skin thickening and firmness within the left breast.  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  BUN and creatinine were obtained on site at [facility] at [address] Results:  BUN 7 mg/dL,  Creatinine 1.0 mg/dL.  BILATERAL BREAST MRI WITH AND WITHOUT CONTRAST  Technique: Multiplanar, multisequence MR images of both breasts were obtained prior to and following the intravenous administration of 20ml of Multihance.  Three dimensional images were evaluated at the independent DynaCad workstation.  Comparison:  Mammograms dated 09/30/2012 and 09/28/2012.  Findings: There has been a previous right mastectomy.  There is marked diffuse enhancement involving the majority of the left breast.  This measures 13.0 x 12.5 x 9.5 cm in size.  This is associated with a mixture of plateau and washout enhancement kinetics.  In addition,  there is diffuse skin thickening and dermal enhancement.  There are multiple enlarged level I left axillary lymph nodes.  The largest lymph node measures 2.4 cm in size.  The large area of enhancement within the left breast extends posteriorly to abut the anterior  portion of the pectoral muscle. There  is no evidence for internal mammary adenopathy or right axillary adenopathy.  There are no additional findings.  IMPRESSION:  1.  Large ( 13 cm) area of enhancement involving the majority of the left breast consistent with the patient's known invasive ductal carcinoma and DCIS. This extends posteriorly to abut the pectoralis muscle. 2.  Multiple enlarged level I left axillary lymph nodes. 3.  Previous right mastectomy.  RECOMMENDATION: Treatment plan  THREE-DIMENSIONAL MR IMAGE RENDERING ON INDEPENDENT WORKSTATION:  Three-dimensional MR images were rendered by post-processing of the original MR data on an independent workstation.  The three- dimensional MR images were interpreted, and findings were reported in the accompanying complete MRI report for this study.  BI-RADS CATEGORY 6:  Known biopsy-proven malignancy - appropriate action should be taken.   Original Report Authenticated By: Rolla Plate, M.D.    Nm Pet Image Initial (pi) Skull Base To Thigh  10/27/2012  *RADIOLOGY REPORT*  Clinical Data: Initial treatment strategy for high risk of breast cancer. Triple negative  NUCLEAR MEDICINE PET SKULL BASE TO THIGH  Fasting Blood Glucose:  97  Technique:  17.9 mCi F-18 FDG was injected intravenously. CT data was obtained and used for attenuation correction and anatomic localization only.  (This was not acquired as a diagnostic CT examination.) Additional exam technical data entered on technologist worksheet.  Comparison:  Findings:  Neck: No hypermetabolic lymph nodes in the neck.  Chest:  There is intense metabolic activity ( SUV max = 45.4) associated with a large portion of the glandular tissue of the left breast consistent with carcinoma.  There are intensely hypermetabolic left axillary lymph nodes with SUV max = 15.1.  These nodes are rounded and measure up to 2 cm. Hypermetabolic nodes extend to the sub pectoralis nodal station (image 71).  There is a single hypermetabolic internal mammary lymph node  measuring 5 mm just left of the sternum (image 87).  This has intense metabolic activity for size with SUV max = 7.1.  No hypermetabolic mediastinal lymph nodes.  No hypermetabolic pulmonary nodules.  Abdomen/Pelvis:  No abnormal hypermetabolic activity within the liver, pancreas, adrenal glands, or spleen.  No hypermetabolic lymph nodes in the abdomen or pelvis.  Skeleton:  No focal hypermetabolic activity to suggest skeletal metastasis.  IMPRESSION:  1.  Hypermetabolic breast tissue on the left consistent with primary carcinoma. 2.  Hypermetabolic nodal metastasis in the left axilla extending to the sub pectoralis nodes. 3.  Single hypermetabolic left internal mammary lymph node.  4.  No evidence of pulmonary metastasis.  5.  No evidence of metastasis beneath hemidiaphragms or within the skeleton.   Original Report Authenticated By: Genevive Bi, M.D.    Dg Chest Port 1 View  10/28/2012  *RADIOLOGY REPORT*  Clinical Data: Port-A-Cath placement.  PORTABLE CHEST - 1 VIEW  Comparison: None.  Findings: The patient is rotated on the study.  Right side Port-A- Cath is in place.  Tubing appears intact.  Tip of the catheter projects in the mid to lower superior vena cava.  No pneumothorax identified.  Heart size is normal.  Lungs are clear.  The patient is status post right mastectomy and axillary dissection.  IMPRESSION: Tip of Port-A-Cath projects over the mid to lower superior vena cava.  The patient is rotated on the study.  Exact position of the tip of the catheter could be determined with the lateral film. No pneumothorax.   Original Report Authenticated By: Holley Dexter, M.D.  Dg Fluoro Guide Cv Line-no Report  10/28/2012  CLINICAL DATA: [facility]   FLOURO GUIDE CV LINE  Fluoroscopy was utilized by the requesting physician.  No radiographic  interpretation.      ASSESSMENT:   57 year old female with  #1 history of breast cancer of the right breast status post mastectomy when she was 57 years  old. Due to this she was referred for genetic testing.   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 Patient's tumor is ER negative PR negative HER-2/neu negative with a Ki-67 of 21%. Patient is being seen in medical oncology for neoadjuvant chemotherapy. Patient and I discussed her pathology in detail. We discussed treatment for breast cancer including surgical and with chemotherapy. Patient understands that she may still need a mastectomy however by giving her neoadjuvant chemotherapy her surgery may be made a little bit easier hopefully with giving her negative margins. She understands that neoadjuvant chemotherapy will also treat her distant disease.I discussed type of chemotherapy that she should have. This would include dose dense FEC x6 cycles followed by weekly Taxol carboplatinum for 12 weeks.  #3 Patient underwent staging studies with PET/CT, and received echo, port placement and chemotherapy class.    #4 Open port wound, healed  PLAN:   1. Doing well.  She will proceed with chemotherapy.  I refilled her magic mouthwash and she will start taking it today for her residual ulcers.  2. Ms. Buttrey will return next week for labs and an appointment for evaluation of chemotoxicities.   All questions were answered. The patient knows to call the clinic with any problems, questions or concerns. We can certainly see the patient much sooner if necessary.  I spent 25 minutes counseling the patient face to face. The total time spent in the appointment was 30 minutes.   Cherie Ouch Lyn Hollingshead, NP [facility] Phone: [phone] 01/11/2013, 2:45 PM

## 2013-01-11 NOTE — Patient Instructions (Signed)
[facility] Discharge Instructions for Patients Receiving Chemotherapy  Today you received the following chemotherapy agents Ellence, Adrucil, Cytoxan. To help prevent nausea and vomiting after your treatment, we encourage you to take your nausea medication lorazepam, ondansetron and prochlorperazine   If you develop nausea and vomiting that is not controlled by your nausea medication, call the clinic.   BELOW ARE SYMPTOMS THAT SHOULD BE REPORTED IMMEDIATELY:  *FEVER GREATER THAN 100.5 F  *CHILLS WITH OR WITHOUT FEVER  NAUSEA AND VOMITING THAT IS NOT CONTROLLED WITH YOUR NAUSEA MEDICATION  *UNUSUAL SHORTNESS OF BREATH  *UNUSUAL BRUISING OR BLEEDING  TENDERNESS IN MOUTH AND THROAT WITH OR WITHOUT PRESENCE OF ULCERS  *URINARY PROBLEMS  *BOWEL PROBLEMS  UNUSUAL RASH Items with * indicate a potential emergency and should be followed up as soon as possible.  Feel free to call the clinic you have any questions or concerns. The clinic phone number is [phone].

## 2013-01-11 NOTE — Progress Notes (Signed)
Discharged with her sister at 31.  Ambulating well in no distress.

## 2013-01-11 NOTE — Telephone Encounter (Signed)
Called in refill of Magic Mouthwash to [facility]. TMB

## 2013-01-11 NOTE — Patient Instructions (Addendum)
Doing well.  Proceed with chemotherapy.  Please call us if you have any questions or concerns.    

## 2013-01-12 ENCOUNTER — Ambulatory Visit (HOSPITAL_BASED_OUTPATIENT_CLINIC_OR_DEPARTMENT_OTHER): Payer: BC Managed Care – PPO

## 2013-01-12 VITALS — BP 145/78 | HR 115 | Temp 98.1°F

## 2013-01-12 DIAGNOSIS — C773 Secondary and unspecified malignant neoplasm of axilla and upper limb lymph nodes: Secondary | ICD-10-CM

## 2013-01-12 DIAGNOSIS — C50912 Malignant neoplasm of unspecified site of left female breast: Secondary | ICD-10-CM

## 2013-01-12 DIAGNOSIS — C50919 Malignant neoplasm of unspecified site of unspecified female breast: Secondary | ICD-10-CM

## 2013-01-12 DIAGNOSIS — Z5189 Encounter for other specified aftercare: Secondary | ICD-10-CM

## 2013-01-12 MED ORDER — PEGFILGRASTIM INJECTION 6 MG/0.6ML
6.0000 mg | Freq: Once | SUBCUTANEOUS | Status: AC
  Administered 2013-01-12: 6 mg via SUBCUTANEOUS
  Filled 2013-01-12: qty 0.6

## 2013-01-13 ENCOUNTER — Other Ambulatory Visit: Payer: Self-pay | Admitting: Internal Medicine

## 2013-01-18 ENCOUNTER — Encounter: Payer: Self-pay | Admitting: Adult Health

## 2013-01-18 ENCOUNTER — Ambulatory Visit (HOSPITAL_BASED_OUTPATIENT_CLINIC_OR_DEPARTMENT_OTHER): Payer: BC Managed Care – PPO | Admitting: Adult Health

## 2013-01-18 ENCOUNTER — Other Ambulatory Visit (HOSPITAL_BASED_OUTPATIENT_CLINIC_OR_DEPARTMENT_OTHER): Payer: BC Managed Care – PPO | Admitting: Lab

## 2013-01-18 VITALS — BP 122/70 | HR 98 | Temp 98.4°F | Resp 20 | Ht 64.0 in | Wt 208.8 lb

## 2013-01-18 DIAGNOSIS — C50912 Malignant neoplasm of unspecified site of left female breast: Secondary | ICD-10-CM

## 2013-01-18 DIAGNOSIS — D709 Neutropenia, unspecified: Secondary | ICD-10-CM

## 2013-01-18 DIAGNOSIS — C50919 Malignant neoplasm of unspecified site of unspecified female breast: Secondary | ICD-10-CM

## 2013-01-18 LAB — CBC WITH DIFFERENTIAL/PLATELET
Basophils Absolute: 0 10*3/uL (ref 0.0–0.1)
Eosinophils Absolute: 0 10*3/uL (ref 0.0–0.5)
HCT: 27.8 % — ABNORMAL LOW (ref 34.8–46.6)
HGB: 9.4 g/dL — ABNORMAL LOW (ref 11.6–15.9)
LYMPH%: 48.3 % (ref 14.0–49.7)
MCV: 90.8 fL (ref 79.5–101.0)
MONO%: 0.8 % (ref 0.0–14.0)
NEUT#: 0.6 10*3/uL — ABNORMAL LOW (ref 1.5–6.5)
NEUT%: 50.1 % (ref 38.4–76.8)
Platelets: 71 10*3/uL — ABNORMAL LOW (ref 145–400)

## 2013-01-18 LAB — COMPREHENSIVE METABOLIC PANEL (CC13)
CO2: 25 mEq/L (ref 22–29)
Calcium: 9.4 mg/dL (ref 8.4–10.4)
Chloride: 99 mEq/L (ref 98–107)
Creatinine: 0.8 mg/dL (ref 0.6–1.1)
Glucose: 211 mg/dl — ABNORMAL HIGH (ref 70–99)
Total Bilirubin: 0.29 mg/dL (ref 0.20–1.20)

## 2013-01-18 NOTE — Patient Instructions (Signed)
  Patient Neutropenia Instruction Sheet  Diagnosis: Breast Cancer      Treating Physician: Drue Second, MD  Treatment: 1. Type of chemotherapy: FEC 2. Date of last treatment: 01/11/13  Last Blood Counts: Lab Results  Component Value Date   WBC 1.2* 01/18/2013   HGB 9.4* 01/18/2013   HCT 27.8* 01/18/2013   MCV 90.8 01/18/2013   PLT 71* 01/18/2013  ANC 600      Prophylactic Antibiotics: Cipro 500 mg by mouth twice a day Instructions: 1. Monitor temperature and call if fever  greater than 100.5, chills, shaking chills (rigors) 2. Call Physician on-call at [phone] 3. Give him/her symptoms and list of medications that you are taking and your last blood count.

## 2013-01-18 NOTE — Progress Notes (Signed)
OFFICE PROGRESS NOTE  CC**  Christine Mew, MD [address] 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   PRIOR THERAPY:  1. Patient has history of right breast cancer patient underwent a mastectomy when she was 57 years old. This occurred during her pregnancy. After that patient did not take any kind of further treatments. She continued to do well.   2.Recently she noted changes in her nipple had become itchy and retracted. Because of this she underwent mammogram with ultrasound that showed 2 areas in the left breast one at 12:00 position and a second at 3:00 position. She also had an ultrasound performed that showed the diameter to be at least 6 cm and multiple left axillary lymph nodes that were abnormal. She then went on to have a biopsy performed on 09/30/2012. 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 Patient went on to have MRIs of the breasts performed and was noted to be marked diffuse enhancement involving majority of the left breast measuring 13.0 x 12.5 x 9.5 cm. There was diffuse skin thickening and dermal enhancement. Multiple enlarged level I left axillary lymph nodes. The largest lymph node measured 2.4 cm in size. The large area of enhancement within the left breast extends posteriorly to abut the anterior portion of the pectoralis muscle. There was no evidence of internal mammary adenopathy or right axillary adenopathy.  PET/CT did show the left breast mass, and left axillary and subpectoralis nodal metastases, but no further areas throughout the chest/abd/pelvis of metastases.   3.  Patient currently undergoing  neoadjuvant chemotherapy with FEC x 6 cycles starting on 11/02/12, which will be followed by 12 cycles of weekly Taxol/Carbo.  CURRENT THERAPY:FEC cycle 5 day 8  INTERVAL HISTORY: [name] 58 y.o. female returns for follow up today.  She is doing well today.  She is mildly fatigued, but her mouth ulcerations are much improved.  She otherwise denies fevers, chills, nausea, vomiting, constipation, numbness, a 10 point ROS is otherwise neg.   MEDICAL HISTORY: Past Medical History  Diagnosis Date  . ALLERGIC RHINITIS   . GERD (gastroesophageal reflux disease)   . Hypertension   . Hypothyroidism   . Hx of colonic polyps   . Wears glasses   . Breast cancer 26, age 60    right  . Breast cancer 09/30/12    left, ER/PR -, Her 2 -    ALLERGIES:  is allergic to sulfa antibiotics and sulfonamide derivatives.  MEDICATIONS:  Current Outpatient Prescriptions  Medication Sig Dispense Refill  . dexamethasone (DECADRON) 4 MG tablet Take 2 tablets by mouth once a day on the day after chemotherapy and then take 2 tablets two times a day for 2 days. Take with food.  30 tablet  1  . diltiazem (CARDIZEM CD) 240 MG 24 hr capsule Take 1 capsule (240 mg total) by mouth daily.  90 capsule  2  . diltiazem (CARDIZEM CD) 240 MG 24 hr capsule TAKE ONE CAPSULE BY MOUTH EVERY DAY  90 capsule  3  . doxycycline (VIBRA-TABS) 100 MG tablet Take 1 tablet (100 mg total) by mouth 2 (two) times daily.  28 tablet  2  .  fluconazole (DIFLUCAN) 200 MG tablet Take 1 tablet by mouth every other day.      . gabapentin (NEURONTIN) 100 MG capsule Take 1 capsule (100 mg total) by mouth 3 (three) times daily.  90 capsule  2  . hydrochlorothiazide (MICROZIDE) 12.5 MG capsule TAKE 1 CAPSULE (12.5 MG TOTAL) BY MOUTH EVERY MORNING.  90 capsule  2  . latanoprost (XALATAN) 0.005 % ophthalmic solution Place 1 drop into both eyes at bedtime.       Marland Kitchen levothyroxine (SYNTHROID, LEVOTHROID) 50 MCG tablet TAKE 1 TABLET (50 MCG TOTAL) BY  MOUTH DAILY.  90 tablet  2  . lidocaine-prilocaine (EMLA) cream Apply topically as needed.  30 g  6  . LORazepam (ATIVAN) 0.5 MG tablet Take 1 tablet (0.5 mg total) by mouth every 6 (six) hours as needed (Nausea or vomiting).  30 tablet  0  . NEXIUM 40 MG capsule TAKE 1 CAPSULE (40 MG TOTAL) BY MOUTH DAILY BEFORE BREAKFAST.  90 capsule  1  . nystatin (MYCOSTATIN) 100000 UNIT/ML suspension TAKE 5 MLS BY MOUTH 4 (FOUR) TIMES DAILY.  180 mL  0  . ondansetron (ZOFRAN) 8 MG tablet Take 1 tablet (8 mg total) by mouth 2 (two) times daily as needed. Take two times a day as needed for nausea or vomiting starting on the third day after chemotherapy.  30 tablet  1  . PATADAY 0.2 % SOLN       . potassium chloride SA (KLOR-CON M20) 20 MEQ tablet Take 1 tablet (20 mEq total) by mouth daily. For 7 days  7 tablet  0  . prochlorperazine (COMPAZINE) 10 MG tablet Take 1 tablet (10 mg total) by mouth every 6 (six) hours as needed (Nausea or vomiting).  30 tablet  1  . prochlorperazine (COMPAZINE) 25 MG suppository Place 1 suppository (25 mg total) rectally every 12 (twelve) hours as needed for nausea.  12 suppository  3   No current facility-administered medications for this visit.    SURGICAL HISTORY:  Past Surgical History  Procedure Laterality Date  . Tonsillectomy    . Caesarean section      x 1  . Mastectomy  04/1987    right side  . Abdominal hysterectomy  06/1988    fibroids  . Portacath placement Right 10/28/2012    Procedure: PORT PLACEMENT;  Surgeon: Clovis Pu. Cornett, MD;  Location: [facility];  Service: General;  Laterality: Right;  Right Subclavian Vein    REVIEW OF SYSTEMS:  General: fatigue (+), night sweats (-), fever (-), pain (-) Lymph: palpable nodes (-) HEENT: vision changes (-), mucositis (-), gum bleeding (-), epistaxis (-) Cardiovascular: chest pain (-), palpitations (-) Pulmonary: shortness of breath (-), dyspnea on exertion (-), cough (-), hemoptysis (-) GI:  Early  satiety (-), melena (-), dysphagia (-), nausea/vomiting (-), diarrhea (-) GU: dysuria (-), hematuria (-), incontinence (-) Musculoskeletal: joint swelling (-), joint pain (-), back pain (-) Neuro: weakness (-), numbness (-), headache (-), confusion (-) Skin: Rash (-), lesions (-), dryness (-) Psych: depression (-), suicidal/homicidal ideation (-), feeling of hopelessness (-)   PHYSICAL EXAMINATION: Blood pressure 122/70, pulse 98, temperature 98.4 F (36.9 C), temperature source Oral, resp. rate 20, height 5\' 4"  (1.626 m), weight 208 lb 12.8 oz (94.711 kg). Body mass index is 35.82 kg/(m^2). General: Patient is a well appearing female in no acute distress HEENT: PERRLA, sclerae anicteric no conjunctival pallor, MMM. Ulcer on left lateral tongue and right buccal mucosa Neck: supple, no  palpable adenopathy Lungs: clear to auscultation bilaterally, no wheezes, rhonchi, or rales Cardiovascular: regular rate rhythm, S1, S2, no murmurs, rubs or gallops Abdomen: Soft, non-tender, non-distended, normoactive bowel sounds, no HSM Extremities: warm and well perfused, no clubbing, cyanosis, or edema Skin: No rashes or lesions Neuro: Non-focal Breasts: Right mastectomy site without nodularity or sign of recurrence.  Left breast mass palpable along with dimpling of skin and inversion of the nipple--softer.  Right chest wall port now closed ECOG PERFORMANCE STATUS: 1 - Symptomatic but completely ambulatory  LABORATORY DATA: Lab Results  Component Value Date   WBC 1.2* 01/18/2013   HGB 9.4* 01/18/2013   HCT 27.8* 01/18/2013   MCV 90.8 01/18/2013   PLT 71* 01/18/2013      Chemistry      Component Value Date/Time   NA 142 01/11/2013 1337   NA 144 10/28/2012 1248   K 3.6 01/11/2013 1337   K 3.9 10/28/2012 1248   CL 103 01/11/2013 1337   CL 108 10/28/2012 1248   CO2 29 01/11/2013 1337   CO2 29 09/13/2011 1910   BUN 12.7 01/11/2013 1337   BUN 10 10/28/2012 1248   CREATININE 0.9 01/11/2013 1337   CREATININE  0.80 10/28/2012 1248      Component Value Date/Time   CALCIUM 9.6 01/11/2013 1337   CALCIUM 9.4 09/13/2011 1910   ALKPHOS 105 01/11/2013 1337   ALKPHOS 78 09/13/2011 1910   AST 21 01/11/2013 1337   AST 18 09/13/2011 1910   ALT 19 01/11/2013 1337   ALT 12 09/13/2011 1910   BILITOT 0.25 01/11/2013 1337   BILITOT 0.3 09/13/2011 1910       RADIOGRAPHIC STUDIES:  Ct Chest W Contrast  10/27/2012  *RADIOLOGY REPORT*  Clinical Data:  High risk breast cancer staging.  Right breast cancer diagnosed 1988.  Left breast cancer diagnosed February 2014.  CT CHEST, ABDOMEN AND PELVIS WITH CONTRAST  Technique:  Multidetector CT imaging of the chest, abdomen and pelvis was performed following the standard protocol during bolus administration of intravenous contrast.  Contrast: OMNIPAQUE IOHEXOL 300 MG/ML  SOLN  Comparison:  PET CT scan 03/26 1014  CT CHEST  Findings:  Right breast mastectomy anatomy.  There is thickening of the skin of the left breast to 11 mm.  There is a mass within the deep posterior left breast measuring 29 x 30 mm (image 6).  There are enlarged round abnormal lymph nodes in the left axilla measuring up to 20 mm short axis (image 22).  Lymph nodes extend beneath the sub pectoralis muscles on the left (image 12).  There is no evidence of internal mammary lymphadenopathy.  No infraclavicular adenopathy.  No mediastinal adenopathy.  No pericardial fluid.  No suspicious pulmonary nodules.  IMPRESSION:  1.  Left breast mass consistent with breast carcinoma. 2.  Skin thickening over the left breast is concerning for inflammatory breast cancer. 3.  Nodal metastasis in the left axilla and sub pectoralis nodal stations. 4.  No evidence of central nodal metastasis or mediastinal metastasis  CT ABDOMEN AND PELVIS  Findings:  No focal hepatic lesion.  The gallbladder, pancreas, spleen, adrenal gland is, and kidneys are normal.  The stomach, small bowel, appendix, cecum are normal.  The colon and rectosigmoid colon  are normal.  Abdominal aorta is normal caliber.  No retroperitoneal or periportal lymphadenopathy.  No mesenteric or peritoneal disease.  No free fluid in  the pelvis.  Post hysterectomy anatomy.  No pelvic lymphadenopathy. Review of  bone windows demonstrates no aggressive osseous lesions.  IMPRESSION:  No evidence of metastasis in the abdomen or pelvis.   Original Report Authenticated By: Genevive Bi, M.D.    Ct Abdomen Pelvis W Contrast  10/27/2012  *RADIOLOGY REPORT*  Clinical Data:  High risk breast cancer staging.  Right breast cancer diagnosed 1988.  Left breast cancer diagnosed February 2014.  CT CHEST, ABDOMEN AND PELVIS WITH CONTRAST  Technique:  Multidetector CT imaging of the chest, abdomen and pelvis was performed following the standard protocol during bolus administration of intravenous contrast.  Contrast: OMNIPAQUE IOHEXOL 300 MG/ML  SOLN  Comparison:  PET CT scan 03/26 1014  CT CHEST  Findings:  Right breast mastectomy anatomy.  There is thickening of the skin of the left breast to 11 mm.  There is a mass within the deep posterior left breast measuring 29 x 30 mm (image 6).  There are enlarged round abnormal lymph nodes in the left axilla measuring up to 20 mm short axis (image 22).  Lymph nodes extend beneath the sub pectoralis muscles on the left (image 12).  There is no evidence of internal mammary lymphadenopathy.  No infraclavicular adenopathy.  No mediastinal adenopathy.  No pericardial fluid.  No suspicious pulmonary nodules.  IMPRESSION:  1.  Left breast mass consistent with breast carcinoma. 2.  Skin thickening over the left breast is concerning for inflammatory breast cancer. 3.  Nodal metastasis in the left axilla and sub pectoralis nodal stations. 4.  No evidence of central nodal metastasis or mediastinal metastasis  CT ABDOMEN AND PELVIS  Findings:  No focal hepatic lesion.  The gallbladder, pancreas, spleen, adrenal gland is, and kidneys are normal.  The stomach, small  bowel, appendix, cecum are normal.  The colon and rectosigmoid colon are normal.  Abdominal aorta is normal caliber.  No retroperitoneal or periportal lymphadenopathy.  No mesenteric or peritoneal disease.  No free fluid in  the pelvis.  Post hysterectomy anatomy.  No pelvic lymphadenopathy. Review of  bone windows demonstrates no aggressive osseous lesions.  IMPRESSION:  No evidence of metastasis in the abdomen or pelvis.   Original Report Authenticated By: Genevive Bi, M.D.    Mr Breast Bilateral W Wo Contrast  10/12/2012  *RADIOLOGY REPORT*  Clinical Data: History of previous malignant mastectomy of the right breast 25 years ago.  The patient developed diffuse skin thickening and firmness within the left breast.  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  BUN and creatinine were obtained on site at [facility] at [address] Results:  BUN 7 mg/dL,  Creatinine 1.0 mg/dL.  BILATERAL BREAST MRI WITH AND WITHOUT CONTRAST  Technique: Multiplanar, multisequence MR images of both breasts were obtained prior to and following the intravenous administration of 20ml of Multihance.  Three dimensional images were evaluated at the independent DynaCad workstation.  Comparison:  Mammograms dated 09/30/2012 and 09/28/2012.  Findings: There has been a previous right mastectomy.  There is marked diffuse enhancement involving the majority of the left breast.  This measures 13.0 x 12.5 x 9.5 cm in size.  This is associated with a mixture of plateau and washout enhancement kinetics.  In addition,  there is diffuse skin thickening and dermal enhancement.  There are multiple enlarged level I left axillary lymph nodes.  The largest lymph node measures 2.4 cm in size.  The large area of enhancement within the  left breast extends posteriorly to abut the anterior portion of the pectoral muscle.  There is no evidence for internal mammary adenopathy or right axillary adenopathy.  There are no additional findings.  IMPRESSION:  1.  Large ( 13 cm) area of enhancement involving the majority of the left breast consistent with the patient's known invasive ductal carcinoma and DCIS. This extends posteriorly to abut the pectoralis muscle. 2.  Multiple enlarged level I left axillary lymph nodes. 3.  Previous right mastectomy.  RECOMMENDATION: Treatment plan  THREE-DIMENSIONAL MR IMAGE RENDERING ON INDEPENDENT WORKSTATION:  Three-dimensional MR images were rendered by post-processing of the original MR data on an independent workstation.  The three- dimensional MR images were interpreted, and findings were reported in the accompanying complete MRI report for this study.  BI-RADS CATEGORY 6:  Known biopsy-proven malignancy - appropriate action should be taken.   Original Report Authenticated By: Rolla Plate, M.D.    Nm Pet Image Initial (pi) Skull Base To Thigh  10/27/2012  *RADIOLOGY REPORT*  Clinical Data: Initial treatment strategy for high risk of breast cancer. Triple negative  NUCLEAR MEDICINE PET SKULL BASE TO THIGH  Fasting Blood Glucose:  97  Technique:  17.9 mCi F-18 FDG was injected intravenously. CT data was obtained and used for attenuation correction and anatomic localization only.  (This was not acquired as a diagnostic CT examination.) Additional exam technical data entered on technologist worksheet.  Comparison:  Findings:  Neck: No hypermetabolic lymph nodes in the neck.  Chest:  There is intense metabolic activity ( SUV max = 21.3) associated with a large portion of the glandular tissue of the left breast consistent with carcinoma.  There are intensely hypermetabolic left axillary lymph nodes with SUV max = 15.1.  These nodes are rounded and measure up to 2 cm. Hypermetabolic nodes extend to the sub pectoralis nodal station (image 71).  There is a single hypermetabolic internal mammary lymph  node measuring 5 mm just left of the sternum (image 87).  This has intense metabolic activity for size with SUV max = 7.1.  No hypermetabolic mediastinal lymph nodes.  No hypermetabolic pulmonary nodules.  Abdomen/Pelvis:  No abnormal hypermetabolic activity within the liver, pancreas, adrenal glands, or spleen.  No hypermetabolic lymph nodes in the abdomen or pelvis.  Skeleton:  No focal hypermetabolic activity to suggest skeletal metastasis.  IMPRESSION:  1.  Hypermetabolic breast tissue on the left consistent with primary carcinoma. 2.  Hypermetabolic nodal metastasis in the left axilla extending to the sub pectoralis nodes. 3.  Single hypermetabolic left internal mammary lymph node.  4.  No evidence of pulmonary metastasis.  5.  No evidence of metastasis beneath hemidiaphragms or within the skeleton.   Original Report Authenticated By: Genevive Bi, M.D.    Dg Chest Port 1 View  10/28/2012  *RADIOLOGY REPORT*  Clinical Data: Port-A-Cath placement.  PORTABLE CHEST - 1 VIEW  Comparison: None.  Findings: The patient is rotated on the study.  Right side Port-A- Cath is in place.  Tubing appears intact.  Tip of the catheter projects in the mid to lower superior vena cava.  No pneumothorax identified.  Heart size is normal.  Lungs are clear.  The patient is status post right mastectomy and axillary dissection.  IMPRESSION: Tip of Port-A-Cath projects over the mid to lower superior vena cava.  The patient is rotated on the study.  Exact position of the tip of the catheter could be determined with the lateral film. No pneumothorax.   Original  Report Authenticated By: Holley Dexter, M.D.    Dg Fluoro Guide Cv Line-no Report  10/28/2012  CLINICAL DATA: [facility]   FLOURO GUIDE CV LINE  Fluoroscopy was utilized by the requesting physician.  No radiographic  interpretation.      ASSESSMENT:   57 year old female with  #1 history of breast cancer of the right breast status post mastectomy when she was 57  years old. Due to this she was referred for genetic testing.   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 Patient's tumor is ER negative PR negative HER-2/neu negative with a Ki-67 of 21%. Patient is being seen in medical oncology for neoadjuvant chemotherapy. Patient and I discussed her pathology in detail. We discussed treatment for breast cancer including surgical and with chemotherapy. Patient understands that she may still need a mastectomy however by giving her neoadjuvant chemotherapy her surgery may be made a little bit easier hopefully with giving her negative margins. She understands that neoadjuvant chemotherapy will also treat her distant disease.I discussed type of chemotherapy that she should have. This would include dose dense FEC x6 cycles followed by weekly Taxol carboplatinum for 12 weeks.  #3 Patient underwent staging studies with PET/CT, and received echo, port placement and chemotherapy class.    #4 Open port wound, healed  PLAN:   1. Doing well.  She is neutropenic.  I reviewed neutropenic precautions with her in detail and gave her written instructions in her AVS.  She will have Cipro filled and start taking BID x 1 week.    2. Ms. Soucy will return next week for labs and an evaluation for chemotherapy.    All questions were answered. The patient knows to call the clinic with any problems, questions or concerns. We can certainly see the patient much sooner if necessary.  I spent 25 minutes counseling the patient face to face. The total time spent in the appointment was 30 minutes.   Cherie Ouch Lyn Hollingshead, NP Medical Oncology [facility] Phone: [phone] 01/18/2013, 3:05 PM

## 2013-01-23 NOTE — Progress Notes (Signed)
OFFICE PROGRESS NOTE  CC  Christine Mew, MD [address] [place] [address] Dr. Erasmo Leventhal Dr. 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   PRIOR THERAPY:  1. Patient has history of right breast cancer patient underwent a mastectomy when she was 57 years old. This occurred during her pregnancy. After that patient did not take any kind of further treatments. She continued to do well.   2.Recently she noted changes in her nipple had become itchy and retracted. Because of this she underwent mammogram with ultrasound that showed 2 areas in the left breast one at 12:00 position and a second at 3:00 position. She also had an ultrasound performed that showed the diameter to be at least 6 cm and multiple left axillary lymph nodes that were abnormal. She then went on to have a biopsy performed on 09/30/2012. 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 Patient went on to have MRIs of the breasts performed and was noted to be marked diffuse enhancement involving majority of the left breast measuring 13.0 x 12.5 x 9.5 cm. There was diffuse skin thickening and dermal enhancement. Multiple enlarged level I left axillary lymph nodes. The largest lymph node measured 2.4 cm in size. The large area of enhancement within the left breast extends posteriorly to abut the anterior portion of the pectoralis muscle. There was no evidence of internal mammary adenopathy or right axillary adenopathy.  PET/CT did show the left breast mass, and left axillary and subpectoralis nodal metastases, but no further areas throughout the chest/abd/pelvis of metastases.    3.  Patient currently undergoing neoadjuvant chemotherapy with FEC x 6 cycles starting on 11/02/12, which will be followed by 12 cycles of weekly Taxol/Carbo.  CURRENT THERAPY:FEC cycle 4 day 1  INTERVAL HISTORY: Christine [name] 57 y.o. female returns for follow up today.  She is doing well today. Overall she is doing well she is tolerating her chemotherapy very nicely. Today she is due for cycle 4. She denies any fevers chills night sweats headaches shortness of breath chest pains palpitations. She has no myalgias and arthralgias. Remainder of the 10 point review of systems is negative.  MEDICAL HISTORY: Past Medical History  Diagnosis Date  . ALLERGIC RHINITIS   . GERD (gastroesophageal reflux disease)   . Hypertension   . Hypothyroidism   . Hx of colonic polyps   . Wears glasses   . Breast cancer 45, age 50    right  . Breast cancer 09/30/12    left, ER/PR -, Her 2 -    ALLERGIES:  is allergic to sulfa antibiotics and sulfonamide derivatives.  MEDICATIONS:  Current Outpatient Prescriptions  Medication Sig Dispense Refill  . dexamethasone (DECADRON) 4 MG tablet Take 2 tablets by mouth once a day on the day after chemotherapy and then take 2 tablets two times a day for 2 days. Take with food.  30 tablet  1  . diltiazem (CARDIZEM CD) 240 MG 24 hr capsule Take 1 capsule (240 mg total) by mouth daily.  90 capsule  2  . diltiazem (CARDIZEM CD) 240 MG 24 hr capsule TAKE ONE CAPSULE BY MOUTH EVERY DAY  90 capsule  3  . doxycycline (VIBRA-TABS) 100 MG tablet Take 1 tablet (100 mg total) by mouth 2 (two) times daily.  28 tablet  2  . fluconazole (DIFLUCAN) 200 MG tablet Take 1 tablet by mouth every other day.      . gabapentin (NEURONTIN) 100 MG capsule Take 1 capsule (100 mg total) by mouth 3 (three) times daily.  90 capsule  2  . hydrochlorothiazide (MICROZIDE) 12.5 MG capsule TAKE 1 CAPSULE (12.5 MG TOTAL) BY MOUTH EVERY MORNING.  90 capsule  2  . latanoprost (XALATAN) 0.005 %  ophthalmic solution Place 1 drop into both eyes at bedtime.       Marland Kitchen levothyroxine (SYNTHROID, LEVOTHROID) 50 MCG tablet TAKE 1 TABLET (50 MCG TOTAL) BY MOUTH DAILY.  90 tablet  2  . lidocaine-prilocaine (EMLA) cream Apply topically as needed.  30 g  6  . LORazepam (ATIVAN) 0.5 MG tablet Take 1 tablet (0.5 mg total) by mouth every 6 (six) hours as needed (Nausea or vomiting).  30 tablet  0  . NEXIUM 40 MG capsule TAKE 1 CAPSULE (40 MG TOTAL) BY MOUTH DAILY BEFORE BREAKFAST.  90 capsule  1  . nystatin (MYCOSTATIN) 100000 UNIT/ML suspension TAKE 5 MLS BY MOUTH 4 (FOUR) TIMES DAILY.  180 mL  0  . ondansetron (ZOFRAN) 8 MG tablet Take 1 tablet (8 mg total) by mouth 2 (two) times daily as needed. Take two times a day as needed for nausea or vomiting starting on the third day after chemotherapy.  30 tablet  1  . PATADAY 0.2 % SOLN       . potassium chloride SA (KLOR-CON M20) 20 MEQ tablet Take 1 tablet (20 mEq total) by mouth daily. For 7 days  7 tablet  0  . prochlorperazine (COMPAZINE) 10 MG tablet Take 1 tablet (10 mg total) by mouth every 6 (six) hours as needed (Nausea or vomiting).  30 tablet  1  . prochlorperazine (COMPAZINE) 25 MG suppository Place 1 suppository (25 mg total) rectally every 12 (twelve) hours as needed for nausea.  12 suppository  3   No current facility-administered medications for this visit.    SURGICAL HISTORY:  Past Surgical History  Procedure Laterality Date  . Tonsillectomy    . Caesarean section      x 1  . Mastectomy  04/1987    right side  . Abdominal hysterectomy  06/1988    fibroids  . Portacath placement Right 10/28/2012    Procedure: PORT PLACEMENT;  Surgeon: Clovis Pu. Cornett, MD;  Location: [facility];  Service: General;  Laterality: Right;  Right Subclavian Vein    REVIEW OF SYSTEMS:  General: fatigue (+), night sweats (-), fever (-), pain (-) Lymph: palpable nodes (-) HEENT: vision changes (-), mucositis (-), gum bleeding (-), epistaxis  (-) Cardiovascular: chest pain (-), palpitations (-) Pulmonary: shortness of breath (-), dyspnea on exertion (-), cough (-), hemoptysis (-) GI:  Early satiety (-), melena (-), dysphagia (-), nausea/vomiting (-), diarrhea (-) GU: dysuria (-), hematuria (-), incontinence (-) Musculoskeletal: joint swelling (-), joint pain (-), back pain (-) Neuro: weakness (-), numbness (-), headache (-), confusion (-) Skin: Rash (-), lesions (-), dryness (-) Psych: depression (-), suicidal/homicidal ideation (-), feeling of hopelessness (-)   PHYSICAL EXAMINATION: Blood pressure 132/83, pulse 90, temperature 98.1 F (36.7 C), temperature source Oral, resp. rate 20, height 5\' 4"  (1.626 m), weight 215 lb 4.8 oz (97.659 kg). Body mass index is 36.94 kg/(m^2). General: Patient is a  well appearing female in no acute distress HEENT: PERRLA, sclerae anicteric no conjunctival pallor, MMM. Small red lesions on tip of tongue and left buccal mucosa. Neck: supple, no palpable adenopathy Lungs: clear to auscultation bilaterally, no wheezes, rhonchi, or rales Cardiovascular: regular rate rhythm, S1, S2, no murmurs, rubs or gallops Abdomen: Soft, non-tender, non-distended, normoactive bowel sounds, no HSM Extremities: warm and well perfused, no clubbing, cyanosis, or edema Skin: No rashes or lesions Neuro: Non-focal Breasts: Right mastectomy site without nodularity or sign of recurrence.  Left breast mass palpable along with dimpling of skin and inversion of the nipple--softer.  Right chest wall port with open incision site.  Yellow tissue at wound bed, no erythema, tenderness, warmth, or exudate.  ECOG PERFORMANCE STATUS: 1 - Symptomatic but completely ambulatory  LABORATORY DATA: Lab Results  Component Value Date   WBC 1.2* 01/18/2013   HGB 9.4* 01/18/2013   HCT 27.8* 01/18/2013   MCV 90.8 01/18/2013   PLT 71* 01/18/2013      Chemistry      Component Value Date/Time   NA 137 01/18/2013 1432   NA 144 10/28/2012  1248   K 3.1 Repeated and Verified* 01/18/2013 1432   K 3.9 10/28/2012 1248   CL 99 01/18/2013 1432   CL 108 10/28/2012 1248   CO2 25 01/18/2013 1432   CO2 29 09/13/2011 1910   BUN 17.0 01/18/2013 1432   BUN 10 10/28/2012 1248   CREATININE 0.8 01/18/2013 1432   CREATININE 0.80 10/28/2012 1248      Component Value Date/Time   CALCIUM 9.4 01/18/2013 1432   CALCIUM 9.4 09/13/2011 1910   ALKPHOS 98 01/18/2013 1432   ALKPHOS 78 09/13/2011 1910   AST 13 01/18/2013 1432   AST 18 09/13/2011 1910   ALT 18 01/18/2013 1432   ALT 12 09/13/2011 1910   BILITOT 0.29 01/18/2013 1432   BILITOT 0.3 09/13/2011 1910       RADIOGRAPHIC STUDIES:  Ct Chest W Contrast  10/27/2012  *RADIOLOGY REPORT*  Clinical Data:  High risk breast cancer staging.  Right breast cancer diagnosed 1988.  Left breast cancer diagnosed February 2014.  CT CHEST, ABDOMEN AND PELVIS WITH CONTRAST  Technique:  Multidetector CT imaging of the chest, abdomen and pelvis was performed following the standard protocol during bolus administration of intravenous contrast.  Contrast: OMNIPAQUE IOHEXOL 300 MG/ML  SOLN  Comparison:  PET CT scan 03/26 1014  CT CHEST  Findings:  Right breast mastectomy anatomy.  There is thickening of the skin of the left breast to 11 mm.  There is a mass within the deep posterior left breast measuring 29 x 30 mm (image 6).  There are enlarged round abnormal lymph nodes in the left axilla measuring up to 20 mm short axis (image 22).  Lymph nodes extend beneath the sub pectoralis muscles on the left (image 12).  There is no evidence of internal mammary lymphadenopathy.  No infraclavicular adenopathy.  No mediastinal adenopathy.  No pericardial fluid.  No suspicious pulmonary nodules.  IMPRESSION:  1.  Left breast mass consistent with breast carcinoma. 2.  Skin thickening over the left breast is concerning for inflammatory breast cancer. 3.  Nodal metastasis in the left axilla and sub pectoralis nodal stations. 4.  No evidence of  central nodal metastasis or mediastinal metastasis  CT ABDOMEN AND PELVIS  Findings:  No focal hepatic lesion.  The gallbladder, pancreas, spleen, adrenal gland is, and kidneys are normal.  The stomach, small bowel, appendix, cecum  are normal.  The colon and rectosigmoid colon are normal.  Abdominal aorta is normal caliber.  No retroperitoneal or periportal lymphadenopathy.  No mesenteric or peritoneal disease.  No free fluid in  the pelvis.  Post hysterectomy anatomy.  No pelvic lymphadenopathy. Review of  bone windows demonstrates no aggressive osseous lesions.  IMPRESSION:  No evidence of metastasis in the abdomen or pelvis.   Original Report Authenticated By: Genevive Bi, M.D.    Ct Abdomen Pelvis W Contrast  10/27/2012  *RADIOLOGY REPORT*  Clinical Data:  High risk breast cancer staging.  Right breast cancer diagnosed 1988.  Left breast cancer diagnosed February 2014.  CT CHEST, ABDOMEN AND PELVIS WITH CONTRAST  Technique:  Multidetector CT imaging of the chest, abdomen and pelvis was performed following the standard protocol during bolus administration of intravenous contrast.  Contrast: OMNIPAQUE IOHEXOL 300 MG/ML  SOLN  Comparison:  PET CT scan 03/26 1014  CT CHEST  Findings:  Right breast mastectomy anatomy.  There is thickening of the skin of the left breast to 11 mm.  There is a mass within the deep posterior left breast measuring 29 x 30 mm (image 6).  There are enlarged round abnormal lymph nodes in the left axilla measuring up to 20 mm short axis (image 22).  Lymph nodes extend beneath the sub pectoralis muscles on the left (image 12).  There is no evidence of internal mammary lymphadenopathy.  No infraclavicular adenopathy.  No mediastinal adenopathy.  No pericardial fluid.  No suspicious pulmonary nodules.  IMPRESSION:  1.  Left breast mass consistent with breast carcinoma. 2.  Skin thickening over the left breast is concerning for inflammatory breast cancer. 3.  Nodal metastasis in the  left axilla and sub pectoralis nodal stations. 4.  No evidence of central nodal metastasis or mediastinal metastasis  CT ABDOMEN AND PELVIS  Findings:  No focal hepatic lesion.  The gallbladder, pancreas, spleen, adrenal gland is, and kidneys are normal.  The stomach, small bowel, appendix, cecum are normal.  The colon and rectosigmoid colon are normal.  Abdominal aorta is normal caliber.  No retroperitoneal or periportal lymphadenopathy.  No mesenteric or peritoneal disease.  No free fluid in  the pelvis.  Post hysterectomy anatomy.  No pelvic lymphadenopathy. Review of  bone windows demonstrates no aggressive osseous lesions.  IMPRESSION:  No evidence of metastasis in the abdomen or pelvis.   Original Report Authenticated By: Genevive Bi, M.D.    Mr Breast Bilateral W Wo Contrast  10/12/2012  *RADIOLOGY REPORT*  Clinical Data: History of previous malignant mastectomy of the right breast 25 years ago.  The patient developed diffuse skin thickening and firmness within the left breast.  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  BUN and creatinine were obtained on site at [facility] at [address] Results:  BUN 7 mg/dL,  Creatinine 1.0 mg/dL.  BILATERAL BREAST MRI WITH AND WITHOUT CONTRAST  Technique: Multiplanar, multisequence MR images of both breasts were obtained prior to and following the intravenous administration of 20ml of Multihance.  Three dimensional images were evaluated at the independent DynaCad workstation.  Comparison:  Mammograms dated 09/30/2012 and 09/28/2012.  Findings: There has been a previous right mastectomy.  There is marked diffuse enhancement involving the majority of the left breast.  This measures 13.0 x 12.5 x 9.5 cm in size.  This is associated with a mixture  of plateau and washout enhancement kinetics.  In addition,  there is diffuse skin  thickening and dermal enhancement.  There are multiple enlarged level I left axillary lymph nodes.  The largest lymph node measures 2.4 cm in size.  The large area of enhancement within the left breast extends posteriorly to abut the anterior portion of the pectoral muscle. There is no evidence for internal mammary adenopathy or right axillary adenopathy.  There are no additional findings.  IMPRESSION:  1.  Large ( 13 cm) area of enhancement involving the majority of the left breast consistent with the patient's known invasive ductal carcinoma and DCIS. This extends posteriorly to abut the pectoralis muscle. 2.  Multiple enlarged level I left axillary lymph nodes. 3.  Previous right mastectomy.  RECOMMENDATION: Treatment plan  THREE-DIMENSIONAL MR IMAGE RENDERING ON INDEPENDENT WORKSTATION:  Three-dimensional MR images were rendered by post-processing of the original MR data on an independent workstation.  The three- dimensional MR images were interpreted, and findings were reported in the accompanying complete MRI report for this study.  BI-RADS CATEGORY 6:  Known biopsy-proven malignancy - appropriate action should be taken.   Original Report Authenticated By: Rolla Plate, M.D.    Nm Pet Image Initial (pi) Skull Base To Thigh  10/27/2012  *RADIOLOGY REPORT*  Clinical Data: Initial treatment strategy for high risk of breast cancer. Triple negative  NUCLEAR MEDICINE PET SKULL BASE TO THIGH  Fasting Blood Glucose:  97  Technique:  17.9 mCi F-18 FDG was injected intravenously. CT data was obtained and used for attenuation correction and anatomic localization only.  (This was not acquired as a diagnostic CT examination.) Additional exam technical data entered on technologist worksheet.  Comparison:  Findings:  Neck: No hypermetabolic lymph nodes in the neck.  Chest:  There is intense metabolic activity ( SUV max = 16.1) associated with a large portion of the glandular tissue of the left breast consistent with  carcinoma.  There are intensely hypermetabolic left axillary lymph nodes with SUV max = 15.1.  These nodes are rounded and measure up to 2 cm. Hypermetabolic nodes extend to the sub pectoralis nodal station (image 71).  There is a single hypermetabolic internal mammary lymph node measuring 5 mm just left of the sternum (image 87).  This has intense metabolic activity for size with SUV max = 7.1.  No hypermetabolic mediastinal lymph nodes.  No hypermetabolic pulmonary nodules.  Abdomen/Pelvis:  No abnormal hypermetabolic activity within the liver, pancreas, adrenal glands, or spleen.  No hypermetabolic lymph nodes in the abdomen or pelvis.  Skeleton:  No focal hypermetabolic activity to suggest skeletal metastasis.  IMPRESSION:  1.  Hypermetabolic breast tissue on the left consistent with primary carcinoma. 2.  Hypermetabolic nodal metastasis in the left axilla extending to the sub pectoralis nodes. 3.  Single hypermetabolic left internal mammary lymph node.  4.  No evidence of pulmonary metastasis.  5.  No evidence of metastasis beneath hemidiaphragms or within the skeleton.   Original Report Authenticated By: Genevive Bi, M.D.    Dg Chest Port 1 View  10/28/2012  *RADIOLOGY REPORT*  Clinical Data: Port-A-Cath placement.  PORTABLE CHEST - 1 VIEW  Comparison: None.  Findings: The patient is rotated on the study.  Right side Port-A- Cath is in place.  Tubing appears intact.  Tip of the catheter projects in the mid to lower superior vena cava.  No pneumothorax identified.  Heart size is normal.  Lungs are clear.  The patient is status post right  mastectomy and axillary dissection.  IMPRESSION: Tip of Port-A-Cath projects over the mid to lower superior vena cava.  The patient is rotated on the study.  Exact position of the tip of the catheter could be determined with the lateral film. No pneumothorax.   Original Report Authenticated By: Holley Dexter, M.D.    Dg Fluoro Guide Cv Line-no Report  10/28/2012   CLINICAL DATA: [facility]   FLOURO GUIDE CV LINE  Fluoroscopy was utilized by the requesting physician.  No radiographic  interpretation.      ASSESSMENT:   57 year old female with  #1 history of breast cancer of the right breast status post mastectomy when she was 57 years old. Due to this she was referred for genetic testing.   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 Patient's tumor is ER negative PR negative HER-2/neu negative with a Ki-67 of 21%. Patient is being seen in medical oncology for neoadjuvant chemotherapy. Patient and I discussed her pathology in detail. We discussed treatment for breast cancer including surgical and with chemotherapy. Patient understands that she may still need a mastectomy however by giving her neoadjuvant chemotherapy her surgery may be made a little bit easier hopefully with giving her negative margins. She understands that neoadjuvant chemotherapy will also treat her distant disease.I discussed type of chemotherapy that she should have. This would include dose dense FEC x6 cycles followed by weekly Taxol carboplatinum for 12 weeks.  #3 Patient underwent staging studies with PET/CT, and received echo, port placement and chemotherapy class.     PLAN:  #1 proceed with cycle #4 of FEC. With day 2 Neulasta to be given tomorrow.  #2 she'll return in one week's time for interim labs and office visit with Augustin Schooling.   All questions were answered. The patient knows to call the clinic with any problems, questions or concerns. We can certainly see the patient much sooner if necessary.  I spent 25 minutes counseling the patient face to face. The total time spent in the appointment was 30 minutes.  Drue Second, MD Medical/Oncology [facility] [phone] (beeper) [phone] (Office)  01/23/2013, 2:45 PM

## 2013-01-25 ENCOUNTER — Encounter: Payer: Self-pay | Admitting: Adult Health

## 2013-01-25 ENCOUNTER — Other Ambulatory Visit (HOSPITAL_BASED_OUTPATIENT_CLINIC_OR_DEPARTMENT_OTHER): Payer: BC Managed Care – PPO | Admitting: Lab

## 2013-01-25 ENCOUNTER — Ambulatory Visit (HOSPITAL_BASED_OUTPATIENT_CLINIC_OR_DEPARTMENT_OTHER): Payer: BC Managed Care – PPO

## 2013-01-25 ENCOUNTER — Ambulatory Visit (HOSPITAL_BASED_OUTPATIENT_CLINIC_OR_DEPARTMENT_OTHER): Payer: BC Managed Care – PPO | Admitting: Adult Health

## 2013-01-25 VITALS — BP 129/69 | HR 103 | Temp 98.1°F | Resp 20 | Ht 64.0 in | Wt 209.4 lb

## 2013-01-25 DIAGNOSIS — E876 Hypokalemia: Secondary | ICD-10-CM

## 2013-01-25 DIAGNOSIS — C50919 Malignant neoplasm of unspecified site of unspecified female breast: Secondary | ICD-10-CM

## 2013-01-25 DIAGNOSIS — Z171 Estrogen receptor negative status [ER-]: Secondary | ICD-10-CM

## 2013-01-25 DIAGNOSIS — C50912 Malignant neoplasm of unspecified site of left female breast: Secondary | ICD-10-CM

## 2013-01-25 DIAGNOSIS — Z5111 Encounter for antineoplastic chemotherapy: Secondary | ICD-10-CM

## 2013-01-25 DIAGNOSIS — C773 Secondary and unspecified malignant neoplasm of axilla and upper limb lymph nodes: Secondary | ICD-10-CM

## 2013-01-25 LAB — CBC WITH DIFFERENTIAL/PLATELET
BASO%: 0.4 % (ref 0.0–2.0)
Eosinophils Absolute: 0 10*3/uL (ref 0.0–0.5)
HCT: 28.5 % — ABNORMAL LOW (ref 34.8–46.6)
LYMPH%: 18 % (ref 14.0–49.7)
MONO#: 0.8 10*3/uL (ref 0.1–0.9)
NEUT#: 3.6 10*3/uL (ref 1.5–6.5)
NEUT%: 66.1 % (ref 38.4–76.8)
Platelets: 126 10*3/uL — ABNORMAL LOW (ref 145–400)
RBC: 3.01 10*6/uL — ABNORMAL LOW (ref 3.70–5.45)
WBC: 5.4 10*3/uL (ref 3.9–10.3)
lymph#: 1 10*3/uL (ref 0.9–3.3)
nRBC: 5 % — ABNORMAL HIGH (ref 0–0)

## 2013-01-25 LAB — COMPREHENSIVE METABOLIC PANEL (CC13)
ALT: 22 U/L (ref 0–55)
AST: 23 U/L (ref 5–34)
Albumin: 3.5 g/dL (ref 3.5–5.0)
CO2: 29 mEq/L (ref 22–29)
Calcium: 9.5 mg/dL (ref 8.4–10.4)
Chloride: 101 mEq/L (ref 98–107)
Creatinine: 1.2 mg/dL — ABNORMAL HIGH (ref 0.6–1.1)
Potassium: 3 mEq/L — CL (ref 3.5–5.1)
Total Protein: 6.7 g/dL (ref 6.4–8.3)

## 2013-01-25 MED ORDER — HEPARIN SOD (PORK) LOCK FLUSH 100 UNIT/ML IV SOLN
500.0000 [IU] | Freq: Once | INTRAVENOUS | Status: AC | PRN
  Administered 2013-01-25: 500 [IU]
  Filled 2013-01-25: qty 5

## 2013-01-25 MED ORDER — POTASSIUM CHLORIDE CRYS ER 20 MEQ PO TBCR: 20.0000 meq | EXTENDED_RELEASE_TABLET | Freq: Every day | ORAL | Status: DC

## 2013-01-25 MED ORDER — SODIUM CHLORIDE 0.9 % IV SOLN
500.0000 mg/m2 | Freq: Once | INTRAVENOUS | Status: AC
  Administered 2013-01-25: 1080 mg via INTRAVENOUS
  Filled 2013-01-25: qty 54

## 2013-01-25 MED ORDER — EPIRUBICIN HCL CHEMO IV INJECTION 200 MG/100ML
100.0000 mg/m2 | Freq: Once | INTRAVENOUS | Status: AC
  Administered 2013-01-25: 214 mg via INTRAVENOUS
  Filled 2013-01-25: qty 107

## 2013-01-25 MED ORDER — SODIUM CHLORIDE 0.9 % IV SOLN
Freq: Once | INTRAVENOUS | Status: AC
  Administered 2013-01-25: 11:00:00 via INTRAVENOUS

## 2013-01-25 MED ORDER — FLUOROURACIL CHEMO INJECTION 2.5 GM/50ML
500.0000 mg/m2 | Freq: Once | INTRAVENOUS | Status: AC
  Administered 2013-01-25: 1050 mg via INTRAVENOUS
  Filled 2013-01-25: qty 21

## 2013-01-25 MED ORDER — POTASSIUM CHLORIDE CRYS ER 20 MEQ PO TBCR
40.0000 meq | EXTENDED_RELEASE_TABLET | Freq: Once | ORAL | Status: AC
  Administered 2013-01-25: 40 meq via ORAL
  Filled 2013-01-25: qty 2

## 2013-01-25 MED ORDER — SODIUM CHLORIDE 0.9 % IJ SOLN
10.0000 mL | INTRAMUSCULAR | Status: DC | PRN
  Administered 2013-01-25: 10 mL
  Filled 2013-01-25: qty 10

## 2013-01-25 MED ORDER — PALONOSETRON HCL INJECTION 0.25 MG/5ML
0.2500 mg | Freq: Once | INTRAVENOUS | Status: AC
  Administered 2013-01-25: 0.25 mg via INTRAVENOUS

## 2013-01-25 MED ORDER — SODIUM CHLORIDE 0.9 % IV SOLN
150.0000 mg | Freq: Once | INTRAVENOUS | Status: AC
  Administered 2013-01-25: 150 mg via INTRAVENOUS
  Filled 2013-01-25: qty 5

## 2013-01-25 MED ORDER — DEXAMETHASONE SODIUM PHOSPHATE 20 MG/5ML IJ SOLN
12.0000 mg | Freq: Once | INTRAMUSCULAR | Status: AC
  Administered 2013-01-25: 12 mg via INTRAVENOUS

## 2013-01-25 NOTE — Patient Instructions (Signed)
[facility] Discharge Instructions for Patients Receiving Chemotherapy  Today you received the following chemotherapy agents 5 FU/Ellence/Cytoxan To help prevent nausea and vomiting after your treatment, we encourage you to take your nausea medication as prescribed.  If you develop nausea and vomiting that is not controlled by your nausea medication, call the clinic.   BELOW ARE SYMPTOMS THAT SHOULD BE REPORTED IMMEDIATELY:  *FEVER GREATER THAN 100.5 F  *CHILLS WITH OR WITHOUT FEVER  NAUSEA AND VOMITING THAT IS NOT CONTROLLED WITH YOUR NAUSEA MEDICATION  *UNUSUAL SHORTNESS OF BREATH  *UNUSUAL BRUISING OR BLEEDING  TENDERNESS IN MOUTH AND THROAT WITH OR WITHOUT PRESENCE OF ULCERS  *URINARY PROBLEMS  *BOWEL PROBLEMS  UNUSUAL RASH Items with * indicate a potential emergency and should be followed up as soon as possible.  Feel free to call the clinic you have any questions or concerns. The clinic phone number is [phone].    

## 2013-01-25 NOTE — Patient Instructions (Addendum)
Carboplatin injection What is this medicine? CARBOPLATIN (KAR boe pla tin) is a chemotherapy drug. It targets fast dividing cells, like cancer cells, and causes these cells to die. This medicine is used to treat ovarian cancer and many other cancers. This medicine may be used for other purposes; ask your health care provider or pharmacist if you have questions. What should I tell my health care provider before I take this medicine? They need to know if you have any of these conditions: -blood disorders -hearing problems -kidney disease -recent or ongoing radiation therapy -an unusual or allergic reaction to carboplatin, cisplatin, other chemotherapy, other medicines, foods, dyes, or preservatives -pregnant or trying to get pregnant -breast-feeding How should I use this medicine? This drug is usually given as an infusion into a vein. It is administered in a hospital or clinic by a specially trained health care professional. Talk to your pediatrician regarding the use of this medicine in children. Special care may be needed. Overdosage: If you think you have taken too much of this medicine contact a poison control center or emergency room at once. NOTE: This medicine is only for you. Do not share this medicine with others. What if I miss a dose? It is important not to miss a dose. Call your doctor or health care professional if you are unable to keep an appointment. What may interact with this medicine? -medicines for seizures -medicines to increase blood counts like filgrastim, pegfilgrastim, sargramostim -some antibiotics like amikacin, gentamicin, neomycin, streptomycin, tobramycin -vaccines Talk to your doctor or health care professional before taking any of these medicines: -acetaminophen -aspirin -ibuprofen -ketoprofen -naproxen This list may not describe all possible interactions. Give your health care provider a list of all the medicines, herbs, non-prescription drugs, or dietary  supplements you use. Also tell them if you smoke, drink alcohol, or use illegal drugs. Some items may interact with your medicine. What should I watch for while using this medicine? Your condition will be monitored carefully while you are receiving this medicine. You will need important blood work done while you are taking this medicine. This drug may make you feel generally unwell. This is not uncommon, as chemotherapy can affect healthy cells as well as cancer cells. Report any side effects. Continue your course of treatment even though you feel ill unless your doctor tells you to stop. In some cases, you may be given additional medicines to help with side effects. Follow all directions for their use. Call your doctor or health care professional for advice if you get a fever, chills or sore throat, or other symptoms of a cold or flu. Do not treat yourself. This drug decreases your body's ability to fight infections. Try to avoid being around people who are sick. This medicine may increase your risk to bruise or bleed. Call your doctor or health care professional if you notice any unusual bleeding. Be careful brushing and flossing your teeth or using a toothpick because you may get an infection or bleed more easily. If you have any dental work done, tell your dentist you are receiving this medicine. Avoid taking products that contain aspirin, acetaminophen, ibuprofen, naproxen, or ketoprofen unless instructed by your doctor. These medicines may hide a fever. Do not become pregnant while taking this medicine. Women should inform their doctor if they wish to become pregnant or think they might be pregnant. There is a potential for serious side effects to an unborn child. Talk to your health care professional or pharmacist for more information.   Do not breast-feed an infant while taking this medicine. What side effects may I notice from receiving this medicine? Side effects that you should report to your  doctor or health care professional as soon as possible: -allergic reactions like skin rash, itching or hives, swelling of the face, lips, or tongue -signs of infection - fever or chills, cough, sore throat, pain or difficulty passing urine -signs of decreased platelets or bleeding - bruising, pinpoint red spots on the skin, black, tarry stools, nosebleeds -signs of decreased red blood cells - unusually weak or tired, fainting spells, lightheadedness -breathing problems -changes in hearing -changes in vision -chest pain -high blood pressure -low blood counts - This drug may decrease the number of white blood cells, red blood cells and platelets. You may be at increased risk for infections and bleeding. -nausea and vomiting -pain, swelling, redness or irritation at the injection site -pain, tingling, numbness in the hands or feet -problems with balance, talking, walking -trouble passing urine or change in the amount of urine Side effects that usually do not require medical attention (report to your doctor or health care professional if they continue or are bothersome): -hair loss -loss of appetite -metallic taste in the mouth or changes in taste This list may not describe all possible side effects. Call your doctor for medical advice about side effects. You may report side effects to FDA at 1-800-FDA-1088. Where should I keep my medicine? This drug is given in a hospital or clinic and will not be stored at home. NOTE: This sheet is a summary. It may not cover all possible information. If you have questions about this medicine, talk to your doctor, pharmacist, or health care provider.  2012, [employer]. (10/26/2007 2:38:05 PM)Paclitaxel injection What is this medicine? PACLITAXEL (PAK li TAX el) is a chemotherapy drug. It targets fast dividing cells, like cancer cells, and causes these cells to die. This medicine is used to treat ovarian cancer, breast cancer, and other  cancers. This medicine may be used for other purposes; ask your health care provider or pharmacist if you have questions. What should I tell my health care provider before I take this medicine? They need to know if you have any of these conditions: -blood disorders -irregular heartbeat -infection (especially a virus infection such as chickenpox, cold sores, or herpes) -liver disease -previous or ongoing radiation therapy -an unusual or allergic reaction to paclitaxel, alcohol, polyoxyethylated castor oil, other chemotherapy agents, other medicines, foods, dyes, or preservatives -pregnant or trying to get pregnant -breast-feeding How should I use this medicine? This drug is given as an infusion into a vein. It is administered in a hospital or clinic by a specially trained health care professional. Talk to your pediatrician regarding the use of this medicine in children. Special care may be needed. Overdosage: If you think you have taken too much of this medicine contact a poison control center or emergency room at once. NOTE: This medicine is only for you. Do not share this medicine with others. What if I miss a dose? It is important not to miss your dose. Call your doctor or health care professional if you are unable to keep an appointment. What may interact with this medicine? Do not take this medicine with any of the following medications: -disulfiram -metronidazole This medicine may also interact with the following medications: -cyclosporine -dexamethasone -diazepam -ketoconazole -medicines to increase blood counts like filgrastim, pegfilgrastim, sargramostim -other chemotherapy drugs like cisplatin, doxorubicin, epirubicin, etoposide, teniposide, vincristine -quinidine -testosterone -vaccines -  verapamil Talk to your doctor or health care professional before taking any of these medicines: -acetaminophen -aspirin -ibuprofen -ketoprofen -naproxen This list may not describe  all possible interactions. Give your health care provider a list of all the medicines, herbs, non-prescription drugs, or dietary supplements you use. Also tell them if you smoke, drink alcohol, or use illegal drugs. Some items may interact with your medicine. What should I watch for while using this medicine? Your condition will be monitored carefully while you are receiving this medicine. You will need important blood work done while you are taking this medicine. This drug may make you feel generally unwell. This is not uncommon, as chemotherapy can affect healthy cells as well as cancer cells. Report any side effects. Continue your course of treatment even though you feel ill unless your doctor tells you to stop. In some cases, you may be given additional medicines to help with side effects. Follow all directions for their use. Call your doctor or health care professional for advice if you get a fever, chills or sore throat, or other symptoms of a cold or flu. Do not treat yourself. This drug decreases your body's ability to fight infections. Try to avoid being around people who are sick. This medicine may increase your risk to bruise or bleed. Call your doctor or health care professional if you notice any unusual bleeding. Be careful brushing and flossing your teeth or using a toothpick because you may get an infection or bleed more easily. If you have any dental work done, tell your dentist you are receiving this medicine. Avoid taking products that contain aspirin, acetaminophen, ibuprofen, naproxen, or ketoprofen unless instructed by your doctor. These medicines may hide a fever. Do not become pregnant while taking this medicine. Women should inform their doctor if they wish to become pregnant or think they might be pregnant. There is a potential for serious side effects to an unborn child. Talk to your health care professional or pharmacist for more information. Do not breast-feed an infant while  taking this medicine. Men are advised not to father a child while receiving this medicine. What side effects may I notice from receiving this medicine? Side effects that you should report to your doctor or health care professional as soon as possible: -allergic reactions like skin rash, itching or hives, swelling of the face, lips, or tongue -low blood counts - This drug may decrease the number of white blood cells, red blood cells and platelets. You may be at increased risk for infections and bleeding. -signs of infection - fever or chills, cough, sore throat, pain or difficulty passing urine -signs of decreased platelets or bleeding - bruising, pinpoint red spots on the skin, black, tarry stools, nosebleeds -signs of decreased red blood cells - unusually weak or tired, fainting spells, lightheadedness -breathing problems -chest pain -high or low blood pressure -mouth sores -nausea and vomiting -pain, swelling, redness or irritation at the injection site -pain, tingling, numbness in the hands or feet -slow or irregular heartbeat -swelling of the ankle, feet, hands Side effects that usually do not require medical attention (report to your doctor or health care professional if they continue or are bothersome): -bone pain -complete hair loss including hair on your head, underarms, pubic hair, eyebrows, and eyelashes -changes in the color of fingernails -diarrhea -loosening of the fingernails -loss of appetite -muscle or joint pain -red flush to skin -sweating This list may not describe all possible side effects. Call your doctor for medical advice   about side effects. You may report side effects to FDA at 1-800-FDA-1088. Where should I keep my medicine? This drug is given in a hospital or clinic and will not be stored at home. NOTE: This sheet is a summary. It may not cover all possible information. If you have questions about this medicine, talk to your doctor, pharmacist, or health care  provider.  2012, [employer]. (07/03/2008 11:54:26 AM) 

## 2013-01-25 NOTE — Progress Notes (Signed)
OFFICE PROGRESS NOTE  CC**  Christine Mew, MD [address] 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   PRIOR THERAPY:  1. Patient has history of right breast cancer patient underwent a mastectomy when she was 57 years old. This occurred during her pregnancy. After that patient did not take any kind of further treatments. She continued to do well.   2.Recently she noted changes in her nipple had become itchy and retracted. Because of this she underwent mammogram with ultrasound that showed 2 areas in the left breast one at 12:00 position and a second at 3:00 position. She also had an ultrasound performed that showed the diameter to be at least 6 cm and multiple left axillary lymph nodes that were abnormal. She then went on to have a biopsy performed on 09/30/2012. 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 Patient went on to have MRIs of the breasts performed and was noted to be marked diffuse enhancement involving majority of the left breast measuring 13.0 x 12.5 x 9.5 cm. There was diffuse skin thickening and dermal enhancement. Multiple enlarged level I left axillary lymph nodes. The largest lymph node measured 2.4 cm in size. The large area of enhancement within the left breast extends posteriorly to abut the anterior portion of the pectoralis muscle. There was no evidence of internal mammary adenopathy or right axillary adenopathy.  PET/CT did show the left breast mass, and left axillary and subpectoralis nodal metastases, but no further areas throughout the chest/abd/pelvis of metastases.   3.  Patient currently undergoing  neoadjuvant chemotherapy with FEC x 6 cycles starting on 11/02/12, which will be followed by 12 cycles of weekly Taxol/Carbo.  CURRENT THERAPY:FEC cycle 6 day 1  INTERVAL HISTORY: Christine [name] 57 y.o. female returns for follow up today.  She is doing well today.  She remains fatigued and slightly dizzy.  She continues to have one mouth ulcer, and her mucositis is resolved after taking Fluconazole.  She denies fevers, chills, nausea, vomiting, constipation, diarrhea, numbness, or any further concerns.  Otherwise a 10 point ROS is negative.    MEDICAL HISTORY: Past Medical History  Diagnosis Date  . ALLERGIC RHINITIS   . GERD (gastroesophageal reflux disease)   . Hypertension   . Hypothyroidism   . Hx of colonic polyps   . Wears glasses   . Breast cancer 74, age 68    right  . Breast cancer 09/30/12    left, ER/PR -, Her 2 -    ALLERGIES:  is allergic to sulfa antibiotics and sulfonamide derivatives.  MEDICATIONS:  Current Outpatient Prescriptions  Medication Sig Dispense Refill  . dexamethasone (DECADRON) 4 MG tablet Take 2 tablets by mouth once a day on the day after chemotherapy and then take 2 tablets two times a day for 2 days. Take with food.  30 tablet  1  . diltiazem (CARDIZEM CD) 240 MG 24 hr capsule Take 1 capsule (240 mg total) by mouth daily.  90 capsule  2  . diltiazem (CARDIZEM CD) 240 MG 24 hr capsule TAKE ONE CAPSULE BY MOUTH EVERY DAY  90 capsule  3  . doxycycline (VIBRA-TABS) 100 MG tablet  Take 1 tablet (100 mg total) by mouth 2 (two) times daily.  28 tablet  2  . fluconazole (DIFLUCAN) 200 MG tablet Take 1 tablet by mouth every other day.      . gabapentin (NEURONTIN) 100 MG capsule Take 1 capsule (100 mg total) by mouth 3 (three) times daily.  90 capsule  2  . hydrochlorothiazide (MICROZIDE) 12.5 MG capsule TAKE 1 CAPSULE (12.5 MG TOTAL) BY MOUTH EVERY MORNING.  90 capsule  2  . latanoprost (XALATAN) 0.005 % ophthalmic solution Place 1 drop into both eyes at  bedtime.       Marland Kitchen levothyroxine (SYNTHROID, LEVOTHROID) 50 MCG tablet TAKE 1 TABLET (50 MCG TOTAL) BY MOUTH DAILY.  90 tablet  2  . lidocaine-prilocaine (EMLA) cream Apply topically as needed.  30 g  6  . LORazepam (ATIVAN) 0.5 MG tablet Take 1 tablet (0.5 mg total) by mouth every 6 (six) hours as needed (Nausea or vomiting).  30 tablet  0  . NEXIUM 40 MG capsule TAKE 1 CAPSULE (40 MG TOTAL) BY MOUTH DAILY BEFORE BREAKFAST.  90 capsule  1  . nystatin (MYCOSTATIN) 100000 UNIT/ML suspension TAKE 5 MLS BY MOUTH 4 (FOUR) TIMES DAILY.  180 mL  0  . ondansetron (ZOFRAN) 8 MG tablet Take 1 tablet (8 mg total) by mouth 2 (two) times daily as needed. Take two times a day as needed for nausea or vomiting starting on the third day after chemotherapy.  30 tablet  1  . PATADAY 0.2 % SOLN       . potassium chloride SA (KLOR-CON M20) 20 MEQ tablet Take 1 tablet (20 mEq total) by mouth daily. For 7 days  7 tablet  0  . prochlorperazine (COMPAZINE) 10 MG tablet Take 1 tablet (10 mg total) by mouth every 6 (six) hours as needed (Nausea or vomiting).  30 tablet  1  . prochlorperazine (COMPAZINE) 25 MG suppository Place 1 suppository (25 mg total) rectally every 12 (twelve) hours as needed for nausea.  12 suppository  3   No current facility-administered medications for this visit.    SURGICAL HISTORY:  Past Surgical History  Procedure Laterality Date  . Tonsillectomy    . Caesarean section      x 1  . Mastectomy  04/1987    right side  . Abdominal hysterectomy  06/1988    fibroids  . Portacath placement Right 10/28/2012    Procedure: PORT PLACEMENT;  Surgeon: Clovis Pu. Cornett, MD;  Location: [facility];  Service: General;  Laterality: Right;  Right Subclavian Vein    REVIEW OF SYSTEMS:  General: fatigue (+), night sweats (-), fever (-), pain (-) Lymph: palpable nodes (-) HEENT: vision changes (-), mucositis (-), gum bleeding (-), epistaxis (-) Cardiovascular: chest pain (-), palpitations  (-) Pulmonary: shortness of breath (-), dyspnea on exertion (-), cough (-), hemoptysis (-) GI:  Early satiety (-), melena (-), dysphagia (-), nausea/vomiting (-), diarrhea (-) GU: dysuria (-), hematuria (-), incontinence (-) Musculoskeletal: joint swelling (-), joint pain (-), back pain (-) Neuro: weakness (-), numbness (-), headache (-), confusion (-) Skin: Rash (-), lesions (-), dryness (-) Psych: depression (-), suicidal/homicidal ideation (-), feeling of hopelessness (-)   PHYSICAL EXAMINATION: Blood pressure 129/69, pulse 103, temperature 98.1 F (36.7 C), temperature source Oral, resp. rate 20, height 5\' 4"  (1.626 m), weight 209 lb 6.4 oz (94.983 kg). Body mass index is 35.93 kg/(m^2). General: Patient is a well appearing female in no acute distress HEENT:  PERRLA, sclerae anicteric no conjunctival pallor, MMM. Ulcer on left lateral tongue and right buccal mucosa Neck: supple, no palpable adenopathy Lungs: clear to auscultation bilaterally, no wheezes, rhonchi, or rales Cardiovascular: regular rate rhythm, S1, S2, no murmurs, rubs or gallops Abdomen: Soft, non-tender, non-distended, normoactive bowel sounds, no HSM Extremities: warm and well perfused, no clubbing, cyanosis, or edema Skin: No rashes or lesions Neuro: Non-focal Breasts: Right mastectomy site without nodularity or sign of recurrence.  Left breast mass palpable along with dimpling of skin and inversion of the nipple--softer.  Right chest wall port now closed ECOG PERFORMANCE STATUS: 1 - Symptomatic but completely ambulatory  LABORATORY DATA: Lab Results  Component Value Date   WBC 5.4 01/25/2013   HGB 9.2* 01/25/2013   HCT 28.5* 01/25/2013   MCV 94.7 01/25/2013   PLT 126* 01/25/2013      Chemistry      Component Value Date/Time   NA 137 01/18/2013 1432   NA 144 10/28/2012 1248   K 3.1 Repeated and Verified* 01/18/2013 1432   K 3.9 10/28/2012 1248   CL 99 01/18/2013 1432   CL 108 10/28/2012 1248   CO2 25 01/18/2013  1432   CO2 29 09/13/2011 1910   BUN 17.0 01/18/2013 1432   BUN 10 10/28/2012 1248   CREATININE 0.8 01/18/2013 1432   CREATININE 0.80 10/28/2012 1248      Component Value Date/Time   CALCIUM 9.4 01/18/2013 1432   CALCIUM 9.4 09/13/2011 1910   ALKPHOS 98 01/18/2013 1432   ALKPHOS 78 09/13/2011 1910   AST 13 01/18/2013 1432   AST 18 09/13/2011 1910   ALT 18 01/18/2013 1432   ALT 12 09/13/2011 1910   BILITOT 0.29 01/18/2013 1432   BILITOT 0.3 09/13/2011 1910       RADIOGRAPHIC STUDIES:  Ct Chest W Contrast  10/27/2012  *RADIOLOGY REPORT*  Clinical Data:  High risk breast cancer staging.  Right breast cancer diagnosed 1988.  Left breast cancer diagnosed February 2014.  CT CHEST, ABDOMEN AND PELVIS WITH CONTRAST  Technique:  Multidetector CT imaging of the chest, abdomen and pelvis was performed following the standard protocol during bolus administration of intravenous contrast.  Contrast: OMNIPAQUE IOHEXOL 300 MG/ML  SOLN  Comparison:  PET CT scan 03/26 1014  CT CHEST  Findings:  Right breast mastectomy anatomy.  There is thickening of the skin of the left breast to 11 mm.  There is a mass within the deep posterior left breast measuring 29 x 30 mm (image 6).  There are enlarged round abnormal lymph nodes in the left axilla measuring up to 20 mm short axis (image 22).  Lymph nodes extend beneath the sub pectoralis muscles on the left (image 12).  There is no evidence of internal mammary lymphadenopathy.  No infraclavicular adenopathy.  No mediastinal adenopathy.  No pericardial fluid.  No suspicious pulmonary nodules.  IMPRESSION:  1.  Left breast mass consistent with breast carcinoma. 2.  Skin thickening over the left breast is concerning for inflammatory breast cancer. 3.  Nodal metastasis in the left axilla and sub pectoralis nodal stations. 4.  No evidence of central nodal metastasis or mediastinal metastasis  CT ABDOMEN AND PELVIS  Findings:  No focal hepatic lesion.  The gallbladder, pancreas, spleen,  adrenal gland is, and kidneys are normal.  The stomach, small bowel, appendix, cecum are normal.  The colon and rectosigmoid colon are normal.  Abdominal aorta is normal caliber.  No retroperitoneal or periportal lymphadenopathy.  No mesenteric  or peritoneal disease.  No free fluid in  the pelvis.  Post hysterectomy anatomy.  No pelvic lymphadenopathy. Review of  bone windows demonstrates no aggressive osseous lesions.  IMPRESSION:  No evidence of metastasis in the abdomen or pelvis.   Original Report Authenticated By: Genevive Bi, M.D.    Ct Abdomen Pelvis W Contrast  10/27/2012  *RADIOLOGY REPORT*  Clinical Data:  High risk breast cancer staging.  Right breast cancer diagnosed 1988.  Left breast cancer diagnosed February 2014.  CT CHEST, ABDOMEN AND PELVIS WITH CONTRAST  Technique:  Multidetector CT imaging of the chest, abdomen and pelvis was performed following the standard protocol during bolus administration of intravenous contrast.  Contrast: OMNIPAQUE IOHEXOL 300 MG/ML  SOLN  Comparison:  PET CT scan 03/26 1014  CT CHEST  Findings:  Right breast mastectomy anatomy.  There is thickening of the skin of the left breast to 11 mm.  There is a mass within the deep posterior left breast measuring 29 x 30 mm (image 6).  There are enlarged round abnormal lymph nodes in the left axilla measuring up to 20 mm short axis (image 22).  Lymph nodes extend beneath the sub pectoralis muscles on the left (image 12).  There is no evidence of internal mammary lymphadenopathy.  No infraclavicular adenopathy.  No mediastinal adenopathy.  No pericardial fluid.  No suspicious pulmonary nodules.  IMPRESSION:  1.  Left breast mass consistent with breast carcinoma. 2.  Skin thickening over the left breast is concerning for inflammatory breast cancer. 3.  Nodal metastasis in the left axilla and sub pectoralis nodal stations. 4.  No evidence of central nodal metastasis or mediastinal metastasis  CT ABDOMEN AND PELVIS   Findings:  No focal hepatic lesion.  The gallbladder, pancreas, spleen, adrenal gland is, and kidneys are normal.  The stomach, small bowel, appendix, cecum are normal.  The colon and rectosigmoid colon are normal.  Abdominal aorta is normal caliber.  No retroperitoneal or periportal lymphadenopathy.  No mesenteric or peritoneal disease.  No free fluid in  the pelvis.  Post hysterectomy anatomy.  No pelvic lymphadenopathy. Review of  bone windows demonstrates no aggressive osseous lesions.  IMPRESSION:  No evidence of metastasis in the abdomen or pelvis.   Original Report Authenticated By: Genevive Bi, M.D.    Mr Breast Bilateral W Wo Contrast  10/12/2012  *RADIOLOGY REPORT*  Clinical Data: History of previous malignant mastectomy of the right breast 25 years ago.  The patient developed diffuse skin thickening and firmness within the left breast.  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  BUN and creatinine were obtained on site at [facility] at [address] Results:  BUN 7 mg/dL,  Creatinine 1.0 mg/dL.  BILATERAL BREAST MRI WITH AND WITHOUT CONTRAST  Technique: Multiplanar, multisequence MR images of both breasts were obtained prior to and following the intravenous administration of 20ml of Multihance.  Three dimensional images were evaluated at the independent DynaCad workstation.  Comparison:  Mammograms dated 09/30/2012 and 09/28/2012.  Findings: There has been a previous right mastectomy.  There is marked diffuse enhancement involving the majority of the left breast.  This measures 13.0 x 12.5 x 9.5 cm in size.  This is associated with a mixture of plateau and washout enhancement kinetics.  In addition,  there is diffuse skin thickening and dermal enhancement.  There are multiple enlarged level I  left axillary lymph nodes.  The largest lymph node measures 2.4 cm in  size.  The large area of enhancement within the left breast extends posteriorly to abut the anterior portion of the pectoral muscle. There is no evidence for internal mammary adenopathy or right axillary adenopathy.  There are no additional findings.  IMPRESSION:  1.  Large ( 13 cm) area of enhancement involving the majority of the left breast consistent with the patient's known invasive ductal carcinoma and DCIS. This extends posteriorly to abut the pectoralis muscle. 2.  Multiple enlarged level I left axillary lymph nodes. 3.  Previous right mastectomy.  RECOMMENDATION: Treatment plan  THREE-DIMENSIONAL MR IMAGE RENDERING ON INDEPENDENT WORKSTATION:  Three-dimensional MR images were rendered by post-processing of the original MR data on an independent workstation.  The three- dimensional MR images were interpreted, and findings were reported in the accompanying complete MRI report for this study.  BI-RADS CATEGORY 6:  Known biopsy-proven malignancy - appropriate action should be taken.   Original Report Authenticated By: Rolla Plate, M.D.    Nm Pet Image Initial (pi) Skull Base To Thigh  10/27/2012  *RADIOLOGY REPORT*  Clinical Data: Initial treatment strategy for high risk of breast cancer. Triple negative  NUCLEAR MEDICINE PET SKULL BASE TO THIGH  Fasting Blood Glucose:  97  Technique:  17.9 mCi F-18 FDG was injected intravenously. CT data was obtained and used for attenuation correction and anatomic localization only.  (This was not acquired as a diagnostic CT examination.) Additional exam technical data entered on technologist worksheet.  Comparison:  Findings:  Neck: No hypermetabolic lymph nodes in the neck.  Chest:  There is intense metabolic activity ( SUV max = 16.1) associated with a large portion of the glandular tissue of the left breast consistent with carcinoma.  There are intensely hypermetabolic left axillary lymph nodes with SUV max = 15.1.  These nodes are rounded and measure up to 2 cm.  Hypermetabolic nodes extend to the sub pectoralis nodal station (image 71).  There is a single hypermetabolic internal mammary lymph node measuring 5 mm just left of the sternum (image 87).  This has intense metabolic activity for size with SUV max = 7.1.  No hypermetabolic mediastinal lymph nodes.  No hypermetabolic pulmonary nodules.  Abdomen/Pelvis:  No abnormal hypermetabolic activity within the liver, pancreas, adrenal glands, or spleen.  No hypermetabolic lymph nodes in the abdomen or pelvis.  Skeleton:  No focal hypermetabolic activity to suggest skeletal metastasis.  IMPRESSION:  1.  Hypermetabolic breast tissue on the left consistent with primary carcinoma. 2.  Hypermetabolic nodal metastasis in the left axilla extending to the sub pectoralis nodes. 3.  Single hypermetabolic left internal mammary lymph node.  4.  No evidence of pulmonary metastasis.  5.  No evidence of metastasis beneath hemidiaphragms or within the skeleton.   Original Report Authenticated By: Genevive Bi, M.D.    Dg Chest Port 1 View  10/28/2012  *RADIOLOGY REPORT*  Clinical Data: Port-A-Cath placement.  PORTABLE CHEST - 1 VIEW  Comparison: None.  Findings: The patient is rotated on the study.  Right side Port-A- Cath is in place.  Tubing appears intact.  Tip of the catheter projects in the mid to lower superior vena cava.  No pneumothorax identified.  Heart size is normal.  Lungs are clear.  The patient is status post right mastectomy and axillary dissection.  IMPRESSION: Tip of Port-A-Cath projects over the mid to lower superior vena cava.  The patient is rotated on the  study.  Exact position of the tip of the catheter could be determined with the lateral film. No pneumothorax.   Original Report Authenticated By: Holley Dexter, M.D.    Dg Fluoro Guide Cv Line-no Report  10/28/2012  CLINICAL DATA: [facility]   FLOURO GUIDE CV LINE  Fluoroscopy was utilized by the requesting physician.  No radiographic  interpretation.       ASSESSMENT:   57 year old female with  #1 history of breast cancer of the right breast status post mastectomy when she was 57 years old. Due to this she was referred for genetic testing.   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 Patient's tumor is ER negative PR negative HER-2/neu negative with a Ki-67 of 21%. Patient is being seen in medical oncology for neoadjuvant chemotherapy. Patient and I discussed her pathology in detail. We discussed treatment for breast cancer including surgical and with chemotherapy. Patient understands that she may still need a mastectomy however by giving her neoadjuvant chemotherapy her surgery may be made a little bit easier hopefully with giving her negative margins. She understands that neoadjuvant chemotherapy will also treat her distant disease.I discussed type of chemotherapy that she should have. This would include dose dense FEC x6 cycles followed by weekly Taxol carboplatinum for 12 weeks.  #3 Patient underwent staging studies with PET/CT, and received echo, port placement and chemotherapy class.    #4 Open port wound, healed  #5 Hypokalemia  PLAN:   1. Doing well.  She will proceed with chemotherapy today.  I gave her information on Taxol and Carbo in her AVS and she will bring questions for discussion about these new agents next week.  I encouraged good fluid intake.    2. Ms. Whiting will return next week for labs and an evaluation for chemotoxicities.      3. She will receive K-Dur po x 1 today, then daily.  We will give her further instructions next week when her potassium is rechecked.    All questions were answered. The patient knows to call the clinic with any problems, questions or concerns. We can certainly see the patient much sooner if necessary.  I spent 25 minutes counseling the patient face to face. The total time spent  in the appointment was 30 minutes.   Cherie Ouch Lyn Hollingshead, NP Medical Oncology [facility] Phone: [phone] 01/25/2013, 10:27 AM

## 2013-01-26 ENCOUNTER — Ambulatory Visit (HOSPITAL_BASED_OUTPATIENT_CLINIC_OR_DEPARTMENT_OTHER): Payer: BC Managed Care – PPO

## 2013-01-26 VITALS — BP 129/77 | HR 104 | Temp 97.5°F | Resp 18

## 2013-01-26 DIAGNOSIS — Z5189 Encounter for other specified aftercare: Secondary | ICD-10-CM

## 2013-01-26 DIAGNOSIS — C50919 Malignant neoplasm of unspecified site of unspecified female breast: Secondary | ICD-10-CM

## 2013-01-26 DIAGNOSIS — C773 Secondary and unspecified malignant neoplasm of axilla and upper limb lymph nodes: Secondary | ICD-10-CM

## 2013-01-26 DIAGNOSIS — C50912 Malignant neoplasm of unspecified site of left female breast: Secondary | ICD-10-CM

## 2013-01-26 MED ORDER — PEGFILGRASTIM INJECTION 6 MG/0.6ML
6.0000 mg | Freq: Once | SUBCUTANEOUS | Status: AC
  Administered 2013-01-26: 6 mg via SUBCUTANEOUS
  Filled 2013-01-26: qty 0.6

## 2013-01-26 NOTE — Progress Notes (Signed)
Quick Note:  Call patient: take poassium 20 meq po BID daily x 7 days ______

## 2013-01-26 NOTE — Patient Instructions (Signed)
Call MD for problems 

## 2013-01-27 ENCOUNTER — Telehealth: Payer: Self-pay | Admitting: Medical Oncology

## 2013-01-27 DIAGNOSIS — E876 Hypokalemia: Secondary | ICD-10-CM

## 2013-01-27 NOTE — Telephone Encounter (Signed)
LVMOM. Per MD, informed patient to take potassium 20 meq by mouth 2 times daily for 7 days d/t lab resulted at low potassium and that prescription has been sent to her listed pharmacy. Asked patient to call office back to confirm receipt of message.

## 2013-01-27 NOTE — Telephone Encounter (Signed)
Patient called back to confirm and verbalized understanding that she will be taking  20 meq by mouth 2 times daily for 7 days. No further questions at this time.

## 2013-01-27 NOTE — Telephone Encounter (Signed)
Message copied by Rexene Edison on Thu Jan 27, 2013 11:09 AM ------      Message from: Victorino December      Created: Wed Jan 26, 2013  5:20 PM       Call patient: take poassium 20 meq po BID daily x 7 days ------

## 2013-02-01 ENCOUNTER — Other Ambulatory Visit: Payer: BC Managed Care – PPO | Admitting: Lab

## 2013-02-01 ENCOUNTER — Ambulatory Visit (HOSPITAL_BASED_OUTPATIENT_CLINIC_OR_DEPARTMENT_OTHER): Payer: BC Managed Care – PPO | Admitting: Adult Health

## 2013-02-01 ENCOUNTER — Encounter: Payer: Self-pay | Admitting: Adult Health

## 2013-02-01 ENCOUNTER — Other Ambulatory Visit (HOSPITAL_BASED_OUTPATIENT_CLINIC_OR_DEPARTMENT_OTHER): Payer: BC Managed Care – PPO | Admitting: Lab

## 2013-02-01 ENCOUNTER — Ambulatory Visit (HOSPITAL_BASED_OUTPATIENT_CLINIC_OR_DEPARTMENT_OTHER): Payer: BC Managed Care – PPO

## 2013-02-01 VITALS — BP 130/77 | HR 104 | Temp 97.9°F | Resp 18

## 2013-02-01 VITALS — BP 138/84 | HR 103 | Temp 98.2°F | Resp 20 | Ht 64.0 in | Wt 207.8 lb

## 2013-02-01 DIAGNOSIS — C50911 Malignant neoplasm of unspecified site of right female breast: Secondary | ICD-10-CM

## 2013-02-01 DIAGNOSIS — C50912 Malignant neoplasm of unspecified site of left female breast: Secondary | ICD-10-CM

## 2013-02-01 DIAGNOSIS — C50919 Malignant neoplasm of unspecified site of unspecified female breast: Secondary | ICD-10-CM

## 2013-02-01 LAB — COMPREHENSIVE METABOLIC PANEL (CC13)
ALT: 18 U/L (ref 0–55)
AST: 12 U/L (ref 5–34)
Albumin: 3.5 g/dL (ref 3.5–5.0)
Alkaline Phosphatase: 91 U/L (ref 40–150)
BUN: 20.8 mg/dL (ref 7.0–26.0)
CO2: 28 meq/L (ref 22–29)
Calcium: 9.3 mg/dL (ref 8.4–10.4)
Chloride: 103 meq/L (ref 98–109)
Creatinine: 0.9 mg/dL (ref 0.6–1.1)
Glucose: 220 mg/dL — ABNORMAL HIGH (ref 70–140)
Potassium: 3.3 meq/L — ABNORMAL LOW (ref 3.5–5.1)
Sodium: 141 meq/L (ref 136–145)
Total Bilirubin: 0.33 mg/dL (ref 0.20–1.20)
Total Protein: 6.5 g/dL (ref 6.4–8.3)

## 2013-02-01 LAB — CBC WITH DIFFERENTIAL/PLATELET
Basophils Absolute: 0 10*3/uL (ref 0.0–0.1)
Eosinophils Absolute: 0 10*3/uL (ref 0.0–0.5)
HCT: 28.7 % — ABNORMAL LOW (ref 34.8–46.6)
LYMPH%: 45.1 % (ref 14.0–49.7)
MCV: 92.6 fL (ref 79.5–101.0)
MONO#: 0.1 10*3/uL (ref 0.1–0.9)
MONO%: 4.6 % (ref 0.0–14.0)
NEUT#: 0.8 10*3/uL — ABNORMAL LOW (ref 1.5–6.5)
NEUT%: 49.6 % (ref 38.4–76.8)
Platelets: 109 10*3/uL — ABNORMAL LOW (ref 145–400)
WBC: 1.5 10*3/uL — ABNORMAL LOW (ref 3.9–10.3)

## 2013-02-01 MED ORDER — SODIUM CHLORIDE 0.9 % IJ SOLN
10.0000 mL | INTRAMUSCULAR | Status: DC | PRN
  Administered 2013-02-01: 10 mL via INTRAVENOUS
  Filled 2013-02-01: qty 10

## 2013-02-01 MED ORDER — HEPARIN SOD (PORK) LOCK FLUSH 100 UNIT/ML IV SOLN
500.0000 [IU] | Freq: Once | INTRAVENOUS | Status: AC
  Administered 2013-02-01: 500 [IU] via INTRAVENOUS
  Filled 2013-02-01: qty 5

## 2013-02-01 NOTE — Patient Instructions (Signed)
Patient to See Augustin Schooling  NP

## 2013-02-01 NOTE — Progress Notes (Signed)
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   PRIOR THERAPY:  1. Patient has history of right breast cancer patient underwent a mastectomy when she was 57 years old. This occurred during her pregnancy. After that patient did not take any kind of further treatments. She continued to do well.   2.Recently she noted changes in her nipple had become itchy and retracted. Because of this she underwent mammogram with ultrasound that showed 2 areas in the left breast one at 12:00 position and a second at 3:00 position. She also had an ultrasound performed that showed the diameter to be at least 6 cm and multiple left axillary lymph nodes that were abnormal. She then went on to have a biopsy performed on 09/30/2012. 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 Patient went on to have MRIs of the breasts performed and was noted to be marked diffuse enhancement involving majority of the left breast measuring 13.0 x 12.5 x 9.5 cm. There was diffuse skin thickening and dermal enhancement. Multiple enlarged level I left axillary lymph nodes. The largest lymph node measured 2.4 cm in size. The large area of enhancement within the left breast extends posteriorly to abut the anterior portion of the pectoralis muscle. There was no evidence of internal mammary adenopathy or right axillary adenopathy.  PET/CT did show the left breast mass, and left axillary and subpectoralis nodal metastases, but no further areas throughout the chest/abd/pelvis of metastases.   3.  Patient currently undergoing  neoadjuvant chemotherapy with FEC x 6 cycles starting on 11/02/12, which will be followed by 12 cycles of weekly Taxol/Carbo.  CURRENT THERAPY:FEC cycle 6 day 8  INTERVAL HISTORY: Christine [name] 57 y.o. female returns for follow up today.  She is doing well today.  Her mouth is starting to hurt, however she has no lesions, she continues to use the magic mouthwash.  She is fatigued, and mildly dizzy, otherwise she denies fevers, chills, nausea, vomiting, constipation,. Diarrhea, numbness, or any further concerns. A 10 point ROS is negative.      MEDICAL HISTORY: Past Medical History  Diagnosis Date  . ALLERGIC RHINITIS   . GERD (gastroesophageal reflux disease)   . Hypertension   . Hypothyroidism   . Hx of colonic polyps   . Wears glasses   . Breast cancer 31, age 57    right  . Breast cancer 09/30/12    left, ER/PR -, Her 2 -    ALLERGIES:  is allergic to sulfa antibiotics and sulfonamide derivatives.  MEDICATIONS:  Current Outpatient Prescriptions  Medication Sig Dispense Refill  . dexamethasone (DECADRON) 4 MG tablet Take 2 tablets by mouth once a day on the day after chemotherapy and then take 2 tablets two times a day for 2 days. Take with food.  30 tablet  1  . diltiazem (CARDIZEM CD) 240 MG 24 hr capsule Take 1 capsule (240 mg total) by mouth daily.  90 capsule  2  . diltiazem (CARDIZEM CD) 240 MG 24 hr capsule TAKE ONE CAPSULE BY MOUTH EVERY DAY  90 capsule  3  . doxycycline (VIBRA-TABS)  100 MG tablet Take 1 tablet (100 mg total) by mouth 2 (two) times daily.  28 tablet  2  . fluconazole (DIFLUCAN) 200 MG tablet Take 1 tablet by mouth every other day.      . gabapentin (NEURONTIN) 100 MG capsule Take 1 capsule (100 mg total) by mouth 3 (three) times daily.  90 capsule  2  . hydrochlorothiazide (MICROZIDE) 12.5 MG capsule TAKE 1 CAPSULE (12.5 MG TOTAL) BY MOUTH EVERY MORNING.  90 capsule  2  . latanoprost (XALATAN) 0.005 % ophthalmic solution Place 1 drop into both eyes at  bedtime.       Marland Kitchen levothyroxine (SYNTHROID, LEVOTHROID) 50 MCG tablet TAKE 1 TABLET (50 MCG TOTAL) BY MOUTH DAILY.  90 tablet  2  . lidocaine-prilocaine (EMLA) cream Apply topically as needed.  30 g  6  . LORazepam (ATIVAN) 0.5 MG tablet Take 1 tablet (0.5 mg total) by mouth every 6 (six) hours as needed (Nausea or vomiting).  30 tablet  0  . NEXIUM 40 MG capsule TAKE 1 CAPSULE (40 MG TOTAL) BY MOUTH DAILY BEFORE BREAKFAST.  90 capsule  1  . nystatin (MYCOSTATIN) 100000 UNIT/ML suspension TAKE 5 MLS BY MOUTH 4 (FOUR) TIMES DAILY.  180 mL  0  . ondansetron (ZOFRAN) 8 MG tablet Take 1 tablet (8 mg total) by mouth 2 (two) times daily as needed. Take two times a day as needed for nausea or vomiting starting on the third day after chemotherapy.  30 tablet  1  . PATADAY 0.2 % SOLN       . potassium chloride SA (KLOR-CON M20) 20 MEQ tablet Take 1 tablet (20 mEq total) by mouth daily.  30 tablet  0  . prochlorperazine (COMPAZINE) 10 MG tablet Take 1 tablet (10 mg total) by mouth every 6 (six) hours as needed (Nausea or vomiting).  30 tablet  1  . prochlorperazine (COMPAZINE) 25 MG suppository Place 1 suppository (25 mg total) rectally every 12 (twelve) hours as needed for nausea.  12 suppository  3   No current facility-administered medications for this visit.    SURGICAL HISTORY:  Past Surgical History  Procedure Laterality Date  . Tonsillectomy    . Caesarean section      x 1  . Mastectomy  04/1987    right side  . Abdominal hysterectomy  06/1988    fibroids  . Portacath placement Right 10/28/2012    Procedure: PORT PLACEMENT;  Surgeon: Clovis Pu. Cornett, MD;  Location: [facility];  Service: General;  Laterality: Right;  Right Subclavian Vein    REVIEW OF SYSTEMS:  General: fatigue (+), night sweats (-), fever (-), pain (-) Lymph: palpable nodes (-) HEENT: vision changes (-), mucositis (-), gum bleeding (-), epistaxis (-) Cardiovascular: chest pain (-), palpitations  (-) Pulmonary: shortness of breath (-), dyspnea on exertion (-), cough (-), hemoptysis (-) GI:  Early satiety (-), melena (-), dysphagia (-), nausea/vomiting (-), diarrhea (-) GU: dysuria (-), hematuria (-), incontinence (-) Musculoskeletal: joint swelling (-), joint pain (-), back pain (-) Neuro: weakness (-), numbness (-), headache (-), confusion (-) Skin: Rash (-), lesions (-), dryness (-) Psych: depression (-), suicidal/homicidal ideation (-), feeling of hopelessness (-)   PHYSICAL EXAMINATION: Blood pressure 138/84, pulse 103, temperature 98.2 F (36.8 C), temperature source Oral, resp. rate 20, height 5\' 4"  (1.626 m), weight 207 lb 12.8 oz (94.257 kg). Body mass index is 35.65 kg/(m^2). General: Patient is a well appearing female in no acute distress HEENT:  PERRLA, sclerae anicteric no conjunctival pallor, MMM. Ulcer on left lateral tongue and right buccal mucosa Neck: supple, no palpable adenopathy Lungs: clear to auscultation bilaterally, no wheezes, rhonchi, or rales Cardiovascular: regular rate rhythm, S1, S2, no murmurs, rubs or gallops Abdomen: Soft, non-tender, non-distended, normoactive bowel sounds, no HSM Extremities: warm and well perfused, no clubbing, cyanosis, or edema Skin: No rashes or lesions Neuro: Non-focal Breasts: Right mastectomy site without nodularity or sign of recurrence.  Left breast mass palpable along with dimpling of skin and inversion of the nipple--softer.  Right chest wall port now closed ECOG PERFORMANCE STATUS: 1 - Symptomatic but completely ambulatory  LABORATORY DATA: Lab Results  Component Value Date   WBC 1.5* 02/01/2013   HGB 9.6* 02/01/2013   HCT 28.7* 02/01/2013   MCV 92.6 02/01/2013   PLT 109* 02/01/2013      Chemistry      Component Value Date/Time   NA 142 01/25/2013 0952   NA 144 10/28/2012 1248   K 3.0 Repeated and Verified* 01/25/2013 0952   K 3.9 10/28/2012 1248   CL 101 01/25/2013 0952   CL 108 10/28/2012 1248   CO2 29 01/25/2013 0952    CO2 29 09/13/2011 1910   BUN 12.3 01/25/2013 0952   BUN 10 10/28/2012 1248   CREATININE 1.2* 01/25/2013 0952   CREATININE 0.80 10/28/2012 1248      Component Value Date/Time   CALCIUM 9.5 01/25/2013 0952   CALCIUM 9.4 09/13/2011 1910   ALKPHOS 98 01/25/2013 0952   ALKPHOS 78 09/13/2011 1910   AST 23 01/25/2013 0952   AST 18 09/13/2011 1910   ALT 22 01/25/2013 0952   ALT 12 09/13/2011 1910   BILITOT 0.38 01/25/2013 0952   BILITOT 0.3 09/13/2011 1910       RADIOGRAPHIC STUDIES:  Ct Chest W Contrast  10/27/2012  *RADIOLOGY REPORT*  Clinical Data:  High risk breast cancer staging.  Right breast cancer diagnosed 1988.  Left breast cancer diagnosed February 2014.  CT CHEST, ABDOMEN AND PELVIS WITH CONTRAST  Technique:  Multidetector CT imaging of the chest, abdomen and pelvis was performed following the standard protocol during bolus administration of intravenous contrast.  Contrast: OMNIPAQUE IOHEXOL 300 MG/ML  SOLN  Comparison:  PET CT scan 03/26 1014  CT CHEST  Findings:  Right breast mastectomy anatomy.  There is thickening of the skin of the left breast to 11 mm.  There is a mass within the deep posterior left breast measuring 29 x 30 mm (image 6).  There are enlarged round abnormal lymph nodes in the left axilla measuring up to 20 mm short axis (image 22).  Lymph nodes extend beneath the sub pectoralis muscles on the left (image 12).  There is no evidence of internal mammary lymphadenopathy.  No infraclavicular adenopathy.  No mediastinal adenopathy.  No pericardial fluid.  No suspicious pulmonary nodules.  IMPRESSION:  1.  Left breast mass consistent with breast carcinoma. 2.  Skin thickening over the left breast is concerning for inflammatory breast cancer. 3.  Nodal metastasis in the left axilla and sub pectoralis nodal stations. 4.  No evidence of central nodal metastasis or mediastinal metastasis  CT ABDOMEN AND PELVIS  Findings:  No focal hepatic lesion.  The gallbladder, pancreas, spleen, adrenal  gland is, and kidneys are normal.  The stomach, small bowel, appendix, cecum are normal.  The colon and rectosigmoid colon are normal.  Abdominal aorta is normal caliber.  No retroperitoneal or periportal lymphadenopathy.  No mesenteric  or peritoneal disease.  No free fluid in  the pelvis.  Post hysterectomy anatomy.  No pelvic lymphadenopathy. Review of  bone windows demonstrates no aggressive osseous lesions.  IMPRESSION:  No evidence of metastasis in the abdomen or pelvis.   Original Report Authenticated By: Genevive Bi, M.D.    Ct Abdomen Pelvis W Contrast  10/27/2012  *RADIOLOGY REPORT*  Clinical Data:  High risk breast cancer staging.  Right breast cancer diagnosed 1988.  Left breast cancer diagnosed February 2014.  CT CHEST, ABDOMEN AND PELVIS WITH CONTRAST  Technique:  Multidetector CT imaging of the chest, abdomen and pelvis was performed following the standard protocol during bolus administration of intravenous contrast.  Contrast: OMNIPAQUE IOHEXOL 300 MG/ML  SOLN  Comparison:  PET CT scan 03/26 1014  CT CHEST  Findings:  Right breast mastectomy anatomy.  There is thickening of the skin of the left breast to 11 mm.  There is a mass within the deep posterior left breast measuring 29 x 30 mm (image 6).  There are enlarged round abnormal lymph nodes in the left axilla measuring up to 20 mm short axis (image 22).  Lymph nodes extend beneath the sub pectoralis muscles on the left (image 12).  There is no evidence of internal mammary lymphadenopathy.  No infraclavicular adenopathy.  No mediastinal adenopathy.  No pericardial fluid.  No suspicious pulmonary nodules.  IMPRESSION:  1.  Left breast mass consistent with breast carcinoma. 2.  Skin thickening over the left breast is concerning for inflammatory breast cancer. 3.  Nodal metastasis in the left axilla and sub pectoralis nodal stations. 4.  No evidence of central nodal metastasis or mediastinal metastasis  CT ABDOMEN AND PELVIS  Findings:  No  focal hepatic lesion.  The gallbladder, pancreas, spleen, adrenal gland is, and kidneys are normal.  The stomach, small bowel, appendix, cecum are normal.  The colon and rectosigmoid colon are normal.  Abdominal aorta is normal caliber.  No retroperitoneal or periportal lymphadenopathy.  No mesenteric or peritoneal disease.  No free fluid in  the pelvis.  Post hysterectomy anatomy.  No pelvic lymphadenopathy. Review of  bone windows demonstrates no aggressive osseous lesions.  IMPRESSION:  No evidence of metastasis in the abdomen or pelvis.   Original Report Authenticated By: Genevive Bi, M.D.    Mr Breast Bilateral W Wo Contrast  10/12/2012  *RADIOLOGY REPORT*  Clinical Data: History of previous malignant mastectomy of the right breast 25 years ago.  The patient developed diffuse skin thickening and firmness within the left breast.  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  BUN and creatinine were obtained on site at [facility] at [address] Results:  BUN 7 mg/dL,  Creatinine 1.0 mg/dL.  BILATERAL BREAST MRI WITH AND WITHOUT CONTRAST  Technique: Multiplanar, multisequence MR images of both breasts were obtained prior to and following the intravenous administration of 20ml of Multihance.  Three dimensional images were evaluated at the independent DynaCad workstation.  Comparison:  Mammograms dated 09/30/2012 and 09/28/2012.  Findings: There has been a previous right mastectomy.  There is marked diffuse enhancement involving the majority of the left breast.  This measures 13.0 x 12.5 x 9.5 cm in size.  This is associated with a mixture of plateau and washout enhancement kinetics.  In addition,  there is diffuse skin thickening and dermal enhancement.  There are multiple enlarged level I  left axillary lymph nodes.  The largest lymph node measures 2.4 cm in size.  The  large area of enhancement within the left breast extends posteriorly to abut the anterior portion of the pectoral muscle. There is no evidence for internal mammary adenopathy or right axillary adenopathy.  There are no additional findings.  IMPRESSION:  1.  Large ( 13 cm) area of enhancement involving the majority of the left breast consistent with the patient's known invasive ductal carcinoma and DCIS. This extends posteriorly to abut the pectoralis muscle. 2.  Multiple enlarged level I left axillary lymph nodes. 3.  Previous right mastectomy.  RECOMMENDATION: Treatment plan  THREE-DIMENSIONAL MR IMAGE RENDERING ON INDEPENDENT WORKSTATION:  Three-dimensional MR images were rendered by post-processing of the original MR data on an independent workstation.  The three- dimensional MR images were interpreted, and findings were reported in the accompanying complete MRI report for this study.  BI-RADS CATEGORY 6:  Known biopsy-proven malignancy - appropriate action should be taken.   Original Report Authenticated By: Rolla Plate, M.D.    Nm Pet Image Initial (pi) Skull Base To Thigh  10/27/2012  *RADIOLOGY REPORT*  Clinical Data: Initial treatment strategy for high risk of breast cancer. Triple negative  NUCLEAR MEDICINE PET SKULL BASE TO THIGH  Fasting Blood Glucose:  97  Technique:  17.9 mCi F-18 FDG was injected intravenously. CT data was obtained and used for attenuation correction and anatomic localization only.  (This was not acquired as a diagnostic CT examination.) Additional exam technical data entered on technologist worksheet.  Comparison:  Findings:  Neck: No hypermetabolic lymph nodes in the neck.  Chest:  There is intense metabolic activity ( SUV max = 11.9) associated with a large portion of the glandular tissue of the left breast consistent with carcinoma.  There are intensely hypermetabolic left axillary lymph nodes with SUV max = 15.1.  These nodes are rounded and measure up to 2 cm.  Hypermetabolic nodes extend to the sub pectoralis nodal station (image 71).  There is a single hypermetabolic internal mammary lymph node measuring 5 mm just left of the sternum (image 87).  This has intense metabolic activity for size with SUV max = 7.1.  No hypermetabolic mediastinal lymph nodes.  No hypermetabolic pulmonary nodules.  Abdomen/Pelvis:  No abnormal hypermetabolic activity within the liver, pancreas, adrenal glands, or spleen.  No hypermetabolic lymph nodes in the abdomen or pelvis.  Skeleton:  No focal hypermetabolic activity to suggest skeletal metastasis.  IMPRESSION:  1.  Hypermetabolic breast tissue on the left consistent with primary carcinoma. 2.  Hypermetabolic nodal metastasis in the left axilla extending to the sub pectoralis nodes. 3.  Single hypermetabolic left internal mammary lymph node.  4.  No evidence of pulmonary metastasis.  5.  No evidence of metastasis beneath hemidiaphragms or within the skeleton.   Original Report Authenticated By: Genevive Bi, M.D.    Dg Chest Port 1 View  10/28/2012  *RADIOLOGY REPORT*  Clinical Data: Port-A-Cath placement.  PORTABLE CHEST - 1 VIEW  Comparison: None.  Findings: The patient is rotated on the study.  Right side Port-A- Cath is in place.  Tubing appears intact.  Tip of the catheter projects in the mid to lower superior vena cava.  No pneumothorax identified.  Heart size is normal.  Lungs are clear.  The patient is status post right mastectomy and axillary dissection.  IMPRESSION: Tip of Port-A-Cath projects over the mid to lower superior vena cava.  The patient is rotated on the  study.  Exact position of the tip of the catheter could be determined with the lateral film. No pneumothorax.   Original Report Authenticated By: Holley Dexter, M.D.    Dg Fluoro Guide Cv Line-no Report  10/28/2012  CLINICAL DATA: [facility]   FLOURO GUIDE CV LINE  Fluoroscopy was utilized by the requesting physician.  No radiographic  interpretation.       ASSESSMENT:   57 year old female with  #1 history of breast cancer of the right breast status post mastectomy when she was 57 years old. Due to this she was referred for genetic testing.   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 Patient's tumor is ER negative PR negative HER-2/neu negative with a Ki-67 of 21%. Patient is being seen in medical oncology for neoadjuvant chemotherapy. Patient and I discussed her pathology in detail. We discussed treatment for breast cancer including surgical and with chemotherapy. Patient understands that she may still need a mastectomy however by giving her neoadjuvant chemotherapy her surgery may be made a little bit easier hopefully with giving her negative margins. She understands that neoadjuvant chemotherapy will also treat her distant disease.I discussed type of chemotherapy that she should have. This would include dose dense FEC x6 cycles followed by weekly Taxol carboplatinum for 12 weeks.  #3 Patient underwent staging studies with PET/CT, and received echo, port placement and chemotherapy class.    #4 Open port wound, healed  #5 Hypokalemia  PLAN:   1. Doing well.  She is subsequently neutropenic after chemotherapy.  She will have cipro refilled and I gave her neutropenic precautions.    2. Ms. Garr will return next week for labs, an appt and cycle 1 of Taxol/Carbo.    3. We will f/u on her potassium today and give her instructions.    All questions were answered. The patient knows to call the clinic with any problems, questions or concerns. We can certainly see the patient much sooner if necessary.  I spent 25 minutes counseling the patient face to face. The total time spent in the appointment was 30 minutes.   Cherie Ouch Lyn Hollingshead, NP Medical Oncology [facility] Phone: [phone] 02/01/2013, 3:12 PM

## 2013-02-01 NOTE — Patient Instructions (Signed)
  Patient Neutropenia Instruction Sheet  Diagnosis: Breast Cancer      Treating Physician: Drue Second, MD  Treatment: 1. Type of chemotherapy: FEC 2. Date of last treatment: 01/25/13  Last Blood Counts: Lab Results  Component Value Date   WBC 1.5* 02/01/2013   HGB 9.6* 02/01/2013   HCT 28.7* 02/01/2013   MCV 92.6 02/01/2013   PLT 109* 02/01/2013  ANC 800      Prophylactic Antibiotics: Cipro 500 mg by mouth twice a day Instructions: 1. Monitor temperature and call if fever  greater than 100.5, chills, shaking chills (rigors) 2. Call Physician on-call at [phone] 3. Give him/her symptoms and list of medications that you are taking and your last blood count.

## 2013-02-03 ENCOUNTER — Encounter: Payer: Self-pay | Admitting: Oncology

## 2013-02-03 NOTE — Progress Notes (Signed)
Faxed clinical information to [facility] attn: Rowe Robert @ [phone].

## 2013-02-08 ENCOUNTER — Other Ambulatory Visit (HOSPITAL_BASED_OUTPATIENT_CLINIC_OR_DEPARTMENT_OTHER): Payer: BC Managed Care – PPO | Admitting: Lab

## 2013-02-08 ENCOUNTER — Encounter: Payer: Self-pay | Admitting: Oncology

## 2013-02-08 ENCOUNTER — Ambulatory Visit (HOSPITAL_BASED_OUTPATIENT_CLINIC_OR_DEPARTMENT_OTHER): Payer: BC Managed Care – PPO | Admitting: Adult Health

## 2013-02-08 ENCOUNTER — Ambulatory Visit (HOSPITAL_BASED_OUTPATIENT_CLINIC_OR_DEPARTMENT_OTHER): Payer: BC Managed Care – PPO

## 2013-02-08 ENCOUNTER — Other Ambulatory Visit: Payer: BC Managed Care – PPO | Admitting: Lab

## 2013-02-08 ENCOUNTER — Encounter: Payer: Self-pay | Admitting: Adult Health

## 2013-02-08 ENCOUNTER — Ambulatory Visit: Payer: BC Managed Care – PPO | Admitting: Adult Health

## 2013-02-08 VITALS — BP 137/70 | HR 86 | Temp 98.1°F | Resp 18

## 2013-02-08 VITALS — BP 132/76 | HR 101 | Temp 98.4°F | Resp 20 | Ht 64.0 in | Wt 209.4 lb

## 2013-02-08 DIAGNOSIS — C50419 Malignant neoplasm of upper-outer quadrant of unspecified female breast: Secondary | ICD-10-CM

## 2013-02-08 DIAGNOSIS — Z5111 Encounter for antineoplastic chemotherapy: Secondary | ICD-10-CM

## 2013-02-08 DIAGNOSIS — C50912 Malignant neoplasm of unspecified site of left female breast: Secondary | ICD-10-CM

## 2013-02-08 DIAGNOSIS — E039 Hypothyroidism, unspecified: Secondary | ICD-10-CM

## 2013-02-08 DIAGNOSIS — I10 Essential (primary) hypertension: Secondary | ICD-10-CM

## 2013-02-08 DIAGNOSIS — Z171 Estrogen receptor negative status [ER-]: Secondary | ICD-10-CM

## 2013-02-08 LAB — CBC WITH DIFFERENTIAL/PLATELET
BASO%: 0.6 % (ref 0.0–2.0)
Eosinophils Absolute: 0 10*3/uL (ref 0.0–0.5)
HCT: 27.2 % — ABNORMAL LOW (ref 34.8–46.6)
HGB: 8.6 g/dL — ABNORMAL LOW (ref 11.6–15.9)
MCHC: 31.6 g/dL (ref 31.5–36.0)
MONO#: 1.4 10*3/uL — ABNORMAL HIGH (ref 0.1–0.9)
NEUT#: 6.3 10*3/uL (ref 1.5–6.5)
NEUT%: 73.2 % (ref 38.4–76.8)
WBC: 8.6 10*3/uL (ref 3.9–10.3)
lymph#: 0.9 10*3/uL (ref 0.9–3.3)
nRBC: 4 % — ABNORMAL HIGH (ref 0–0)

## 2013-02-08 LAB — COMPREHENSIVE METABOLIC PANEL (CC13)
ALT: 24 U/L (ref 0–55)
CO2: 30 mEq/L — ABNORMAL HIGH (ref 22–29)
Calcium: 8.9 mg/dL (ref 8.4–10.4)
Chloride: 103 mEq/L (ref 98–109)
Sodium: 145 mEq/L (ref 136–145)
Total Protein: 6.2 g/dL — ABNORMAL LOW (ref 6.4–8.3)

## 2013-02-08 MED ORDER — SODIUM CHLORIDE 0.9 % IV SOLN
257.8000 mg | Freq: Once | INTRAVENOUS | Status: AC
  Administered 2013-02-08: 260 mg via INTRAVENOUS
  Filled 2013-02-08: qty 26

## 2013-02-08 MED ORDER — LORAZEPAM 0.5 MG PO TABS: 0.5000 mg | ORAL_TABLET | Freq: Four times a day (QID) | ORAL | Status: DC | PRN

## 2013-02-08 MED ORDER — PACLITAXEL CHEMO INJECTION 300 MG/50ML
80.0000 mg/m2 | Freq: Once | INTRAVENOUS | Status: AC
  Administered 2013-02-08: 162 mg via INTRAVENOUS
  Filled 2013-02-08: qty 27

## 2013-02-08 MED ORDER — FAMOTIDINE IN NACL 20-0.9 MG/50ML-% IV SOLN
20.0000 mg | Freq: Once | INTRAVENOUS | Status: AC
  Administered 2013-02-08: 20 mg via INTRAVENOUS

## 2013-02-08 MED ORDER — SODIUM CHLORIDE 0.9 % IJ SOLN
10.0000 mL | INTRAMUSCULAR | Status: DC | PRN
  Administered 2013-02-08: 10 mL
  Filled 2013-02-08: qty 10

## 2013-02-08 MED ORDER — DIPHENHYDRAMINE HCL 50 MG/ML IJ SOLN
50.0000 mg | Freq: Once | INTRAMUSCULAR | Status: AC
  Administered 2013-02-08: 50 mg via INTRAVENOUS

## 2013-02-08 MED ORDER — HEPARIN SOD (PORK) LOCK FLUSH 100 UNIT/ML IV SOLN
500.0000 [IU] | Freq: Once | INTRAVENOUS | Status: AC | PRN
  Administered 2013-02-08: 500 [IU]
  Filled 2013-02-08: qty 5

## 2013-02-08 MED ORDER — DEXAMETHASONE SODIUM PHOSPHATE 20 MG/5ML IJ SOLN
20.0000 mg | Freq: Once | INTRAMUSCULAR | Status: AC
  Administered 2013-02-08: 20 mg via INTRAVENOUS

## 2013-02-08 MED ORDER — SODIUM CHLORIDE 0.9 % IV SOLN
Freq: Once | INTRAVENOUS | Status: AC
  Administered 2013-02-08: 14:00:00 via INTRAVENOUS

## 2013-02-08 MED ORDER — ONDANSETRON 16 MG/50ML IVPB (CHCC)
16.0000 mg | Freq: Once | INTRAVENOUS | Status: AC
  Administered 2013-02-08: 16 mg via INTRAVENOUS

## 2013-02-08 NOTE — Progress Notes (Signed)
OFFICE PROGRESS NOTE  CC**  [name], MD [address] 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   PRIOR THERAPY:  1. Patient has history of right breast cancer patient underwent a mastectomy when she was 57 years old. This occurred during her pregnancy. After that patient did not take any kind of further treatments. She continued to do well.   2.Recently she noted changes in her nipple had become itchy and retracted. Because of this she underwent mammogram with ultrasound that showed 2 areas in the left breast one at 12:00 position and a second at 3:00 position. She also had an ultrasound performed that showed the diameter to be at least 6 cm and multiple left axillary lymph nodes that were abnormal. She then went on to have a biopsy performed on 09/30/2012. 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 Patient went on to have MRIs of the breasts performed and was noted to be marked diffuse enhancement involving majority of the left breast measuring 13.0 x 12.5 x 9.5 cm. There was diffuse skin thickening and dermal enhancement. Multiple enlarged level I left axillary lymph nodes. The largest lymph node measured 2.4 cm in size. The large area of enhancement within the left breast extends posteriorly to abut the anterior portion of the pectoralis muscle. There was no evidence of internal mammary adenopathy or right axillary adenopathy.  PET/CT did show the left breast mass, and left axillary and subpectoralis nodal metastases, but no further areas throughout the chest/abd/pelvis of metastases.   3.  Patient currently undergoing  neoadjuvant chemotherapy with FEC x 6 cycles starting on 11/02/12, which will be followed by 12 cycles of weekly Taxol/Carbo.  CURRENT THERAPY: Taxol Carbo week 1  INTERVAL HISTORY: [name] 57 y.o. female returns for follow up today prior to her first cycle of Taxol/Carbo.  She had a mild amount of thrush that she took Fluconazole for and it has almost completely resolved.  Otherwise, she is doing well today.  She denies fevers, chills, nausea, vomiting, constipation, diarrhea, numbness, or any further concerns.  A 10 point ROS is otherwise negative.    MEDICAL HISTORY: Past Medical History  Diagnosis Date  . ALLERGIC RHINITIS   . GERD (gastroesophageal reflux disease)   . Hypertension   . Hypothyroidism   . Hx of colonic polyps   . Wears glasses   . Breast cancer 89, age 8    right  . Breast cancer 09/30/12    left, ER/PR -, Her 2 -    ALLERGIES:  is allergic to sulfa antibiotics and sulfonamide derivatives.  MEDICATIONS:  Current Outpatient Prescriptions  Medication Sig Dispense Refill  . diltiazem (CARDIZEM CD) 240 MG 24 hr capsule Take 1 capsule (240 mg total) by mouth daily.  90 capsule  2  . diltiazem (CARDIZEM CD) 240 MG 24 hr capsule TAKE ONE CAPSULE BY MOUTH EVERY DAY  90 capsule  3  . doxycycline (VIBRA-TABS) 100 MG tablet Take 1 tablet (100 mg total) by mouth 2 (two) times daily.  28 tablet  2  . fluconazole (DIFLUCAN) 200 MG tablet Take 1 tablet by mouth every other day.      Marland Kitchen  gabapentin (NEURONTIN) 100 MG capsule Take 1 capsule (100 mg total) by mouth 3 (three) times daily.  90 capsule  2  . hydrochlorothiazide (MICROZIDE) 12.5 MG capsule TAKE 1 CAPSULE (12.5 MG TOTAL) BY MOUTH EVERY MORNING.  90 capsule  2  . latanoprost (XALATAN) 0.005 % ophthalmic solution Place 1 drop into both eyes at bedtime.       Marland Kitchen levothyroxine (SYNTHROID, LEVOTHROID) 50 MCG tablet TAKE 1 TABLET (50 MCG TOTAL) BY MOUTH DAILY.  90 tablet  2  . lidocaine-prilocaine (EMLA) cream Apply  topically as needed.  30 g  6  . LORazepam (ATIVAN) 0.5 MG tablet Take 1 tablet (0.5 mg total) by mouth every 6 (six) hours as needed (Nausea or vomiting).  30 tablet  0  . NEXIUM 40 MG capsule TAKE 1 CAPSULE (40 MG TOTAL) BY MOUTH DAILY BEFORE BREAKFAST.  90 capsule  1  . nystatin (MYCOSTATIN) 100000 UNIT/ML suspension TAKE 5 MLS BY MOUTH 4 (FOUR) TIMES DAILY.  180 mL  0  . PATADAY 0.2 % SOLN       . potassium chloride SA (KLOR-CON M20) 20 MEQ tablet Take 1 tablet (20 mEq total) by mouth daily.  30 tablet  0   No current facility-administered medications for this visit.    SURGICAL HISTORY:  Past Surgical History  Procedure Laterality Date  . Tonsillectomy    . Caesarean section      x 1  . Mastectomy  04/1987    right side  . Abdominal hysterectomy  06/1988    fibroids  . Portacath placement Right 10/28/2012    Procedure: PORT PLACEMENT;  Surgeon: Clovis Pu. Cornett, MD;  Location: [facility];  Service: General;  Laterality: Right;  Right Subclavian Vein    REVIEW OF SYSTEMS:  General: fatigue (+), night sweats (-), fever (-), pain (-) Lymph: palpable nodes (-) HEENT: vision changes (-), mucositis (-), gum bleeding (-), epistaxis (-) Cardiovascular: chest pain (-), palpitations (-) Pulmonary: shortness of breath (-), dyspnea on exertion (-), cough (-), hemoptysis (-) GI:  Early satiety (-), melena (-), dysphagia (-), nausea/vomiting (-), diarrhea (-) GU: dysuria (-), hematuria (-), incontinence (-) Musculoskeletal: joint swelling (-), joint pain (-), back pain (-) Neuro: weakness (-), numbness (-), headache (-), confusion (-) Skin: Rash (-), lesions (-), dryness (-) Psych: depression (-), suicidal/homicidal ideation (-), feeling of hopelessness (-)   PHYSICAL EXAMINATION: Blood pressure 132/76, pulse 101, temperature 98.4 F (36.9 C), temperature source Oral, resp. rate 20, height 5\' 4"  (1.626 m), weight 209 lb 6.4 oz (94.983 kg). Body mass index is 35.93  kg/(m^2). General: Patient is a well appearing female in no acute distress HEENT: PERRLA, sclerae anicteric no conjunctival pallor, MMM. Ulcer on left lateral tongue and right buccal mucosa Neck: supple, no palpable adenopathy Lungs: clear to auscultation bilaterally, no wheezes, rhonchi, or rales Cardiovascular: regular rate rhythm, S1, S2, no murmurs, rubs or gallops Abdomen: Soft, non-tender, non-distended, normoactive bowel sounds, no HSM Extremities: warm and well perfused, no clubbing, cyanosis, or edema Skin: No rashes or lesions Neuro: Non-focal Breasts: Right mastectomy site without nodularity or sign of recurrence.  Left breast mass palpable along with dimpling of skin and inversion of the nipple--softer.  Right chest wall port now closed ECOG PERFORMANCE STATUS: 1 - Symptomatic but completely ambulatory  LABORATORY DATA: Lab Results  Component Value Date   WBC 8.6 02/08/2013   HGB 8.6* 02/08/2013   HCT 27.2* 02/08/2013   MCV 96.8 02/08/2013   PLT  103* 02/08/2013      Chemistry      Component Value Date/Time   NA 145 02/08/2013 1237   NA 144 10/28/2012 1248   K 3.1* 02/08/2013 1237   K 3.9 10/28/2012 1248   CL 101 01/25/2013 0952   CL 108 10/28/2012 1248   CO2 30* 02/08/2013 1237   CO2 29 09/13/2011 1910   BUN 10.7 02/08/2013 1237   BUN 10 10/28/2012 1248   CREATININE 0.9 02/08/2013 1237   CREATININE 0.80 10/28/2012 1248      Component Value Date/Time   CALCIUM 8.9 02/08/2013 1237   CALCIUM 9.4 09/13/2011 1910   ALKPHOS 87 02/08/2013 1237   ALKPHOS 78 09/13/2011 1910   AST 25 02/08/2013 1237   AST 18 09/13/2011 1910   ALT 24 02/08/2013 1237   ALT 12 09/13/2011 1910   BILITOT 0.32 02/08/2013 1237   BILITOT 0.3 09/13/2011 1910       RADIOGRAPHIC STUDIES:  Ct Chest W Contrast  10/27/2012  *RADIOLOGY REPORT*  Clinical Data:  High risk breast cancer staging.  Right breast cancer diagnosed 1988.  Left breast cancer diagnosed February 2014.  CT CHEST, ABDOMEN AND PELVIS WITH CONTRAST  Technique:   Multidetector CT imaging of the chest, abdomen and pelvis was performed following the standard protocol during bolus administration of intravenous contrast.  Contrast: OMNIPAQUE IOHEXOL 300 MG/ML  SOLN  Comparison:  PET CT scan 03/26 1014  CT CHEST  Findings:  Right breast mastectomy anatomy.  There is thickening of the skin of the left breast to 11 mm.  There is a mass within the deep posterior left breast measuring 29 x 30 mm (image 6).  There are enlarged round abnormal lymph nodes in the left axilla measuring up to 20 mm short axis (image 22).  Lymph nodes extend beneath the sub pectoralis muscles on the left (image 12).  There is no evidence of internal mammary lymphadenopathy.  No infraclavicular adenopathy.  No mediastinal adenopathy.  No pericardial fluid.  No suspicious pulmonary nodules.  IMPRESSION:  1.  Left breast mass consistent with breast carcinoma. 2.  Skin thickening over the left breast is concerning for inflammatory breast cancer. 3.  Nodal metastasis in the left axilla and sub pectoralis nodal stations. 4.  No evidence of central nodal metastasis or mediastinal metastasis  CT ABDOMEN AND PELVIS  Findings:  No focal hepatic lesion.  The gallbladder, pancreas, spleen, adrenal gland is, and kidneys are normal.  The stomach, small bowel, appendix, cecum are normal.  The colon and rectosigmoid colon are normal.  Abdominal aorta is normal caliber.  No retroperitoneal or periportal lymphadenopathy.  No mesenteric or peritoneal disease.  No free fluid in  the pelvis.  Post hysterectomy anatomy.  No pelvic lymphadenopathy. Review of  bone windows demonstrates no aggressive osseous lesions.  IMPRESSION:  No evidence of metastasis in the abdomen or pelvis.   Original Report Authenticated By: Genevive Bi, M.D.    Ct Abdomen Pelvis W Contrast  10/27/2012  *RADIOLOGY REPORT*  Clinical Data:  High risk breast cancer staging.  Right breast cancer diagnosed 1988.  Left breast cancer diagnosed  February 2014.  CT CHEST, ABDOMEN AND PELVIS WITH CONTRAST  Technique:  Multidetector CT imaging of the chest, abdomen and pelvis was performed following the standard protocol during bolus administration of intravenous contrast.  Contrast: OMNIPAQUE IOHEXOL 300 MG/ML  SOLN  Comparison:  PET CT scan 03/26 1014  CT CHEST  Findings:  Right breast mastectomy anatomy.  There is thickening of the skin of the left breast to 11 mm.  There is a mass within the deep posterior left breast measuring 29 x 30 mm (image 6).  There are enlarged round abnormal lymph nodes in the left axilla measuring up to 20 mm short axis (image 22).  Lymph nodes extend beneath the sub pectoralis muscles on the left (image 12).  There is no evidence of internal mammary lymphadenopathy.  No infraclavicular adenopathy.  No mediastinal adenopathy.  No pericardial fluid.  No suspicious pulmonary nodules.  IMPRESSION:  1.  Left breast mass consistent with breast carcinoma. 2.  Skin thickening over the left breast is concerning for inflammatory breast cancer. 3.  Nodal metastasis in the left axilla and sub pectoralis nodal stations. 4.  No evidence of central nodal metastasis or mediastinal metastasis  CT ABDOMEN AND PELVIS  Findings:  No focal hepatic lesion.  The gallbladder, pancreas, spleen, adrenal gland is, and kidneys are normal.  The stomach, small bowel, appendix, cecum are normal.  The colon and rectosigmoid colon are normal.  Abdominal aorta is normal caliber.  No retroperitoneal or periportal lymphadenopathy.  No mesenteric or peritoneal disease.  No free fluid in  the pelvis.  Post hysterectomy anatomy.  No pelvic lymphadenopathy. Review of  bone windows demonstrates no aggressive osseous lesions.  IMPRESSION:  No evidence of metastasis in the abdomen or pelvis.   Original Report Authenticated By: Genevive Bi, M.D.    Mr Breast Bilateral W Wo Contrast  10/12/2012  *RADIOLOGY REPORT*  Clinical Data: History of previous malignant  mastectomy of the right breast 25 years ago.  The patient developed diffuse skin thickening and firmness within the left breast.  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  BUN and creatinine were obtained on site at [facility] at [address] Results:  BUN 7 mg/dL,  Creatinine 1.0 mg/dL.  BILATERAL BREAST MRI WITH AND WITHOUT CONTRAST  Technique: Multiplanar, multisequence MR images of both breasts were obtained prior to and following the intravenous administration of 20ml of Multihance.  Three dimensional images were evaluated at the independent DynaCad workstation.  Comparison:  Mammograms dated 09/30/2012 and 09/28/2012.  Findings: There has been a previous right mastectomy.  There is marked diffuse enhancement involving the majority of the left breast.  This measures 13.0 x 12.5 x 9.5 cm in size.  This is associated with a mixture of plateau and washout enhancement kinetics.  In addition,  there is diffuse skin thickening and dermal enhancement.  There are multiple enlarged level I left axillary lymph nodes.  The largest lymph node measures 2.4 cm in size.  The large area of enhancement within the left breast extends posteriorly to abut the anterior portion of the pectoral muscle. There is no evidence for internal mammary adenopathy or right axillary adenopathy.  There are no additional findings.  IMPRESSION:  1.  Large ( 13 cm) area of enhancement involving the majority of the left breast consistent with the patient's known invasive ductal carcinoma and DCIS. This extends posteriorly to abut the pectoralis muscle. 2.  Multiple enlarged level I left axillary lymph nodes. 3.  Previous right mastectomy.  RECOMMENDATION: Treatment plan  THREE-DIMENSIONAL MR IMAGE RENDERING ON INDEPENDENT WORKSTATION:  Three-dimensional MR images were rendered by post-processing of the  original MR data on an independent workstation.  The three- dimensional MR images were interpreted, and findings  were reported in the accompanying complete MRI report for this study.  BI-RADS CATEGORY 6:  Known biopsy-proven malignancy - appropriate action should be taken.   Original Report Authenticated By: Rolla Plate, M.D.    Nm Pet Image Initial (pi) Skull Base To Thigh  10/27/2012  *RADIOLOGY REPORT*  Clinical Data: Initial treatment strategy for high risk of breast cancer. Triple negative  NUCLEAR MEDICINE PET SKULL BASE TO THIGH  Fasting Blood Glucose:  97  Technique:  17.9 mCi F-18 FDG was injected intravenously. CT data was obtained and used for attenuation correction and anatomic localization only.  (This was not acquired as a diagnostic CT examination.) Additional exam technical data entered on technologist worksheet.  Comparison:  Findings:  Neck: No hypermetabolic lymph nodes in the neck.  Chest:  There is intense metabolic activity ( SUV max = 81.1) associated with a large portion of the glandular tissue of the left breast consistent with carcinoma.  There are intensely hypermetabolic left axillary lymph nodes with SUV max = 15.1.  These nodes are rounded and measure up to 2 cm. Hypermetabolic nodes extend to the sub pectoralis nodal station (image 71).  There is a single hypermetabolic internal mammary lymph node measuring 5 mm just left of the sternum (image 87).  This has intense metabolic activity for size with SUV max = 7.1.  No hypermetabolic mediastinal lymph nodes.  No hypermetabolic pulmonary nodules.  Abdomen/Pelvis:  No abnormal hypermetabolic activity within the liver, pancreas, adrenal glands, or spleen.  No hypermetabolic lymph nodes in the abdomen or pelvis.  Skeleton:  No focal hypermetabolic activity to suggest skeletal metastasis.  IMPRESSION:  1.  Hypermetabolic breast tissue on the left consistent with primary carcinoma. 2.  Hypermetabolic nodal metastasis in the left axilla  extending to the sub pectoralis nodes. 3.  Single hypermetabolic left internal mammary lymph node.  4.  No evidence of pulmonary metastasis.  5.  No evidence of metastasis beneath hemidiaphragms or within the skeleton.   Original Report Authenticated By: Genevive Bi, M.D.    Dg Chest Port 1 View  10/28/2012  *RADIOLOGY REPORT*  Clinical Data: Port-A-Cath placement.  PORTABLE CHEST - 1 VIEW  Comparison: None.  Findings: The patient is rotated on the study.  Right side Port-A- Cath is in place.  Tubing appears intact.  Tip of the catheter projects in the mid to lower superior vena cava.  No pneumothorax identified.  Heart size is normal.  Lungs are clear.  The patient is status post right mastectomy and axillary dissection.  IMPRESSION: Tip of Port-A-Cath projects over the mid to lower superior vena cava.  The patient is rotated on the study.  Exact position of the tip of the catheter could be determined with the lateral film. No pneumothorax.   Original Report Authenticated By: Holley Dexter, M.D.    Dg Fluoro Guide Cv Line-no Report  10/28/2012  CLINICAL DATA: [facility]   FLOURO GUIDE CV LINE  Fluoroscopy was utilized by the requesting physician.  No radiographic  interpretation.      ASSESSMENT:   57 year old female with  #1 history of breast cancer of the right breast status post mastectomy when she was 57 years old. Due to this she was referred for genetic testing.   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 Patient's tumor is ER negative PR negative HER-2/neu negative with a Ki-67 of 21%. Patient is  being seen in medical oncology for neoadjuvant chemotherapy. Patient and I discussed her pathology in detail. We discussed treatment for breast cancer including surgical and with chemotherapy. Patient understands that she may still need a mastectomy however by giving her neoadjuvant  chemotherapy her surgery may be made a little bit easier hopefully with giving her negative margins. She understands that neoadjuvant chemotherapy will also treat her distant disease.I discussed type of chemotherapy that she should have. This would include dose dense FEC x6 cycles followed by weekly Taxol carboplatinum for 12 weeks.  #3 Patient underwent staging studies with PET/CT, and received echo, port placement and chemotherapy class.    #4 Open port wound, healed  #5 Hypokalemia  PLAN:   1. Doing well.  Her counts have recovered and she is ready to proceed with chemotherapy today.  We discussed her chemotherapy in detail and good skin/nail care, and adverse effects to monitor.  I explained her nausea medication in detail and gave her written instructions in her AVS.    2. Ms. Rodden will return next week for labs, an appt and cycle 2 of Taxol/Carbo.    3. We will f/u on her potassium today and give her instructions.    All questions were answered. The patient knows to call the clinic with any problems, questions or concerns. We can certainly see the patient much sooner if necessary.  I spent 25 minutes counseling the patient face to face. The total time spent in the appointment was 30 minutes.   Cherie Ouch Lyn Hollingshead, NP Medical Oncology [facility] Phone: [phone] 02/08/2013, 11:03 PM

## 2013-02-08 NOTE — Patient Instructions (Addendum)
[facility] Discharge Instructions for Patients Receiving Chemotherapy  Today you received the following chemotherapy agents Taxol and Carboplatin.  To help prevent nausea and vomiting after your treatment, we encourage you to take your nausea medication.   If you develop nausea and vomiting that is not controlled by your nausea medication, call the clinic.   BELOW ARE SYMPTOMS THAT SHOULD BE REPORTED IMMEDIATELY:  *FEVER GREATER THAN 100.5 F  *CHILLS WITH OR WITHOUT FEVER  NAUSEA AND VOMITING THAT IS NOT CONTROLLED WITH YOUR NAUSEA MEDICATION  *UNUSUAL SHORTNESS OF BREATH  *UNUSUAL BRUISING OR BLEEDING  TENDERNESS IN MOUTH AND THROAT WITH OR WITHOUT PRESENCE OF ULCERS  *URINARY PROBLEMS  *BOWEL PROBLEMS  UNUSUAL RASH Items with * indicate a potential emergency and should be followed up as soon as possible.  Feel free to call the clinic you have any questions or concerns. The clinic phone number is [phone].    

## 2013-02-08 NOTE — Patient Instructions (Signed)
Doing well.  Proceed with chemotherapy.  Take Dexamethasone/Decadron and Ondansetron/Zofran twice a day for 3 days after chemotherapy.  Please call us if you have any questions or concerns.

## 2013-02-09 ENCOUNTER — Telehealth: Payer: Self-pay | Admitting: *Deleted

## 2013-02-09 NOTE — Telephone Encounter (Signed)
Message copied by Augusto Garbe on Wed Feb 09, 2013 12:27 PM ------      Message from: Lorenza Evangelist A      Created: Tue Feb 08, 2013  5:38 PM       First time Taxol and Carboplatin. Dr Welton Flakes.            Thanks            Sharyl Nimrod ------

## 2013-02-09 NOTE — Telephone Encounter (Signed)
Ms. Christine [name] is doing well.  Reports being flushed and having trouble sleeping so she took an ativan last night.  Family advised that she rest today.  No distress just resting.  Has not checked her temperature yet but doesn't feel as if she has a fever.  Denies any signs of infection just likes to check her temperature for any changes.  Asked that she call if needed.

## 2013-02-11 ENCOUNTER — Other Ambulatory Visit: Payer: Self-pay | Admitting: *Deleted

## 2013-02-11 DIAGNOSIS — C50919 Malignant neoplasm of unspecified site of unspecified female breast: Secondary | ICD-10-CM

## 2013-02-11 MED ORDER — DEXAMETHASONE 4 MG PO TABS: ORAL_TABLET | ORAL | Status: DC

## 2013-02-15 ENCOUNTER — Other Ambulatory Visit (HOSPITAL_BASED_OUTPATIENT_CLINIC_OR_DEPARTMENT_OTHER): Payer: BC Managed Care – PPO | Admitting: Lab

## 2013-02-15 ENCOUNTER — Ambulatory Visit (HOSPITAL_BASED_OUTPATIENT_CLINIC_OR_DEPARTMENT_OTHER): Payer: BC Managed Care – PPO | Admitting: Adult Health

## 2013-02-15 ENCOUNTER — Ambulatory Visit (HOSPITAL_BASED_OUTPATIENT_CLINIC_OR_DEPARTMENT_OTHER): Payer: BC Managed Care – PPO

## 2013-02-15 ENCOUNTER — Encounter: Payer: Self-pay | Admitting: Adult Health

## 2013-02-15 ENCOUNTER — Encounter: Payer: Self-pay | Admitting: Oncology

## 2013-02-15 VITALS — BP 129/81 | HR 104 | Temp 99.0°F | Resp 18 | Ht 64.0 in | Wt 209.0 lb

## 2013-02-15 DIAGNOSIS — C50919 Malignant neoplasm of unspecified site of unspecified female breast: Secondary | ICD-10-CM

## 2013-02-15 DIAGNOSIS — E876 Hypokalemia: Secondary | ICD-10-CM

## 2013-02-15 DIAGNOSIS — C50912 Malignant neoplasm of unspecified site of left female breast: Secondary | ICD-10-CM

## 2013-02-15 DIAGNOSIS — Z5111 Encounter for antineoplastic chemotherapy: Secondary | ICD-10-CM

## 2013-02-15 DIAGNOSIS — C773 Secondary and unspecified malignant neoplasm of axilla and upper limb lymph nodes: Secondary | ICD-10-CM

## 2013-02-15 DIAGNOSIS — Z853 Personal history of malignant neoplasm of breast: Secondary | ICD-10-CM

## 2013-02-15 DIAGNOSIS — Z171 Estrogen receptor negative status [ER-]: Secondary | ICD-10-CM

## 2013-02-15 DIAGNOSIS — D649 Anemia, unspecified: Secondary | ICD-10-CM

## 2013-02-15 LAB — COMPREHENSIVE METABOLIC PANEL (CC13)
Alkaline Phosphatase: 86 U/L (ref 40–150)
BUN: 13 mg/dL (ref 7.0–26.0)
Glucose: 179 mg/dl — ABNORMAL HIGH (ref 70–140)
Total Bilirubin: 0.2 mg/dL (ref 0.20–1.20)

## 2013-02-15 LAB — CBC WITH DIFFERENTIAL/PLATELET
Basophils Absolute: 0 10*3/uL (ref 0.0–0.1)
EOS%: 0.2 % (ref 0.0–7.0)
HCT: 25.3 % — ABNORMAL LOW (ref 34.8–46.6)
HGB: 8.3 g/dL — ABNORMAL LOW (ref 11.6–15.9)
MCH: 31 pg (ref 25.1–34.0)
MCV: 94.4 fL (ref 79.5–101.0)
MONO%: 7.5 % (ref 0.0–14.0)
NEUT%: 77.7 % — ABNORMAL HIGH (ref 38.4–76.8)

## 2013-02-15 MED ORDER — SODIUM CHLORIDE 0.9 % IV SOLN
Freq: Once | INTRAVENOUS | Status: AC
  Administered 2013-02-15: 16:00:00 via INTRAVENOUS

## 2013-02-15 MED ORDER — RANITIDINE HCL 50 MG/2ML IJ SOLN
50.0000 mg | Freq: Once | INTRAVENOUS | Status: AC
  Administered 2013-02-15: 50 mg via INTRAVENOUS
  Filled 2013-02-15: qty 2

## 2013-02-15 MED ORDER — SODIUM CHLORIDE 0.9 % IV SOLN
257.8000 mg | Freq: Once | INTRAVENOUS | Status: AC
  Administered 2013-02-15: 260 mg via INTRAVENOUS
  Filled 2013-02-15: qty 26

## 2013-02-15 MED ORDER — FAMOTIDINE IN NACL 20-0.9 MG/50ML-% IV SOLN: 20.0000 mg | Freq: Once | INTRAVENOUS | Status: DC

## 2013-02-15 MED ORDER — SODIUM CHLORIDE 0.9 % IJ SOLN
10.0000 mL | INTRAMUSCULAR | Status: DC | PRN
Start: 2013-02-15 — End: 2013-02-15
  Administered 2013-02-15: 10 mL
  Filled 2013-02-15: qty 10

## 2013-02-15 MED ORDER — SODIUM CHLORIDE 0.9 % IV SOLN
80.0000 mg/m2 | Freq: Once | INTRAVENOUS | Status: AC
  Administered 2013-02-15: 162 mg via INTRAVENOUS
  Filled 2013-02-15: qty 27

## 2013-02-15 MED ORDER — DIPHENHYDRAMINE HCL 50 MG/ML IJ SOLN
50.0000 mg | Freq: Once | INTRAMUSCULAR | Status: AC
  Administered 2013-02-15: 50 mg via INTRAVENOUS

## 2013-02-15 MED ORDER — ONDANSETRON 16 MG/50ML IVPB (CHCC)
16.0000 mg | Freq: Once | INTRAVENOUS | Status: AC
  Administered 2013-02-15: 16 mg via INTRAVENOUS

## 2013-02-15 MED ORDER — DEXAMETHASONE SODIUM PHOSPHATE 20 MG/5ML IJ SOLN
20.0000 mg | Freq: Once | INTRAMUSCULAR | Status: AC
  Administered 2013-02-15: 20 mg via INTRAVENOUS

## 2013-02-15 MED ORDER — HEPARIN SOD (PORK) LOCK FLUSH 100 UNIT/ML IV SOLN
500.0000 [IU] | Freq: Once | INTRAVENOUS | Status: AC | PRN
  Administered 2013-02-15: 500 [IU]
  Filled 2013-02-15: qty 5

## 2013-02-15 NOTE — Progress Notes (Signed)
Per office note Candida Peeling 02/15/13 - pt ok to proceed with treatment with Hgb 8.3.  TKF

## 2013-02-15 NOTE — Patient Instructions (Addendum)
[facility] Discharge Instructions for Patients Receiving Chemotherapy  Today you received the following chemotherapy agents:  Taxol, carboplatin  To help prevent nausea and vomiting after your treatment, we encourage you to take your nausea medication.  Take it as often as prescribed.     If you develop nausea and vomiting that is not controlled by your nausea medication, call the clinic. If it is after clinic hours your family physician or the after hours number for the clinic or go to the Emergency Department.   BELOW ARE SYMPTOMS THAT SHOULD BE REPORTED IMMEDIATELY:  *FEVER GREATER THAN 100.5 F  *CHILLS WITH OR WITHOUT FEVER  NAUSEA AND VOMITING THAT IS NOT CONTROLLED WITH YOUR NAUSEA MEDICATION  *UNUSUAL SHORTNESS OF BREATH  *UNUSUAL BRUISING OR BLEEDING  TENDERNESS IN MOUTH AND THROAT WITH OR WITHOUT PRESENCE OF ULCERS  *URINARY PROBLEMS  *BOWEL PROBLEMS  UNUSUAL RASH Items with * indicate a potential emergency and should be followed up as soon as possible.  Feel free to call the clinic you have any questions or concerns. The clinic phone number is [phone].   I have been informed and understand all the instructions given to me. I know to contact the clinic, my physician, or go to the Emergency Department if any problems should occur. I do not have any questions at this time, but understand that I may call the clinic during office hours   should I have any questions or need assistance in obtaining follow up care.    __________________________________________  _____________  __________ Signature of Patient or Authorized Representative            Date                   Time    __________________________________________ Nurse's Signature    

## 2013-02-15 NOTE — Patient Instructions (Addendum)
Anemia, Frequently Asked Questions WHAT ARE THE SYMPTOMS OF ANEMIA?  Headache.  Difficulty thinking.  Fatigue.  Shortness of breath.  Weakness.  Rapid heartbeat. AT WHAT POINT ARE PEOPLE CONSIDERED ANEMIC?  This varies with gender and age.   Both hemoglobin (Hgb) and hematocrit values are used to define anemia. These lab values are obtained from a complete blood count (CBC) test. This is performed at a caregiver's office.  The normal range of hemoglobin values for adult men is 14.0 g/dL to 17.4 g/dL. For nonpregnant women, values are 12.3 g/dL to 15.3 g/dL.  The World Health Organization defines anemia as less than 12 g/dL for nonpregnant women and less than 13 g/dL for men.  For adult males, the average normal hematocrit is 46%, and the range is 40% to 52%.  For adult females, the average normal hematocrit is 41%, and the range is 35% to 47%.  Values that fall below the lower limits can be a sign of anemia and should have further checking (evaluation). GROUPS OF PEOPLE WHO ARE AT RISK FOR DEVELOPING ANEMIA INCLUDE:   Infants who are breastfed or taking a formula that is not fortified with iron.  Children going through a rapid growth spurt. The iron available can not keep up with the needs for a red cell mass which must grow with the child.  Women in childbearing years. They need iron because of blood loss during menstruation.  Pregnant women. The growing fetus creates a high demand for iron.  People with ongoing gastrointestinal blood loss are at risk of developing iron deficiency.  Individuals with leukemia or cancer who must receive chemotherapy or radiation to treat their disease. The drugs or radiation used to treat these diseases often decreases the bone marrow's ability to make cells of all classes. This includes red blood cells, white blood cells, and platelets.  Individuals with chronic inflammatory conditions such as rheumatoid arthritis or chronic  infections.  The elderly. ARE SOME TYPES OF ANEMIA INHERITED?   Yes, some types of anemia are due to inherited or genetic defects.  Sickle cell anemia. This occurs most often in people of African, African American, and Mediterranean descent.  Thalassemia (or Cooley's anemia). This type is found in people of Mediterranean and Southeast Asian descent. These types of anemia are common.  [place]i. This is rare. CAN CERTAIN MEDICATIONS CAUSE A PERSON TO BECOME ANEMIC?  Yes. For example, drugs to fight cancer (chemotherapeutic agents) often cause anemia. These drugs can slow the bone marrow's ability to make red blood cells. If there are not enough red blood cells, the body does not get enough oxygen. WHAT HEMATOCRIT LEVEL IS REQUIRED TO DONATE BLOOD?  The lower limit of an acceptable hematocrit for blood donors is 38%. If you have a low hematocrit value, you should schedule an appointment with your caregiver. ARE BLOOD TRANSFUSIONS COMMONLY USED TO CORRECT ANEMIA, AND ARE THEY DANGEROUS?  They are used to treat anemia as a last resort. Your caregiver will find the cause of the anemia and correct it if possible. Most blood transfusions are given because of excessive bleeding at the time of surgery, with trauma, or because of bone marrow suppression in patients with cancer or leukemia on chemotherapy. Blood transfusions are safer than ever before. We also know that blood transfusions affect the immune system and may increase certain risks. There is also a concern for human error. In 1/16,000 transfusions, a patient receives a transfusion of blood that is not matched with his or her   blood type.  WHAT IS IRON DEFICIENCY ANEMIA AND CAN I CORRECT IT BY CHANGING MY DIET?  Iron is an essential part of hemoglobin. Without enough hemoglobin, anemia develops and the body does not get the right amount of oxygen. Iron deficiency anemia develops after the body has had a low level of iron for a long time. This is  either caused by blood loss, not taking in or absorbing enough iron, or increased demands for iron (like pregnancy or rapid growth).  Foods from animal origin such as beef, chicken, and pork, are good sources of iron. Be sure to have one of these foods at each meal. Vitamin C helps your body absorb iron. Foods rich in Vitamin C include citrus, bell pepper, strawberries, spinach and cantaloupe. In some cases, iron supplements may be needed in order to correct the iron deficiency. In the case of poor absorption, extra iron may have to be given directly into the vein through a needle (intravenously). I HAVE BEEN DIAGNOSED WITH IRON DEFICIENCY ANEMIA AND MY CAREGIVER PRESCRIBED IRON SUPPLEMENTS. HOW LONG WILL IT TAKE FOR MY BLOOD TO BECOME NORMAL?  It depends on the degree of anemia at the beginning of treatment. Most people with mild to moderate iron deficiency, anemia will correct the anemia over a period of 2 to 3 months. But after the anemia is corrected, the iron stored by the body is still low. Caregivers often suggest an additional 6 months of oral iron therapy once the anemia has been reversed. This will help prevent the iron deficiency anemia from quickly happening again. Non-anemic adult males should take iron supplements only under the direction of a doctor, too much iron can cause liver damage.  MY HEMOGLOBIN IS 9 G/DL AND I AM SCHEDULED FOR SURGERY. SHOULD I POSTPONE THE SURGERY?  If you have Hgb of 9, you should discuss this with your caregiver right away. Many patients with similar hemoglobin levels have had surgery without problems. If minimal blood loss is expected for a minor procedure, no treatment may be necessary.  If a greater blood loss is expected for more extensive procedures, you should ask your caregiver about being treated with erythropoietin and iron. This is to accelerate the recovery of your hemoglobin to a normal level before surgery. An anemic patient who undergoes high-blood-loss  surgery has a greater risk of surgical complications and need for a blood transfusion, which also carries some risk.  I HAVE BEEN TOLD THAT HEAVY MENSTRUAL PERIODS CAUSE ANEMIA. IS THERE ANYTHING I CAN DO TO PREVENT THE ANEMIA?  Anemia that results from heavy periods is usually due to iron deficiency. You can try to meet the increased demands for iron caused by the heavy monthly blood loss by increasing the intake of iron-rich foods. Iron supplements may be required. Discuss your concerns with your caregiver. WHAT CAUSES ANEMIA DURING PREGNANCY?  Pregnancy places major demands on the body. The mother must meet the needs of both her body and her growing baby. The body needs enough iron and folate to make the right amount of red blood cells. To prevent anemia while pregnant, the mother should stay in close contact with her caregiver.  Be sure to eat a diet that has foods rich in iron and folate like liver and dark green leafy vegetables. Folate plays an important role in the normal development of a baby's spinal cord. Folate can help prevent serious disorders like spina bifida. If your diet does not provide adequate nutrients, you may want to talk   with your caregiver about nutritional supplements.  WHAT IS THE RELATIONSHIP BETWEEN FIBROID TUMORS AND ANEMIA IN WOMEN?  The relationship is usually caused by the increased menstrual blood loss caused by fibroids. Good iron intake may be required to prevent iron deficiency anemia from developing.  Document Released: 02/27/2004 Document Revised: 10/13/2011 Document Reviewed: 08/13/2010 [facility] Patient Information 2014 [place], [place]. Blood Transfusion Information WHAT IS A BLOOD TRANSFUSION? A transfusion is the replacement of blood or some of its parts. Blood is made up of multiple cells which provide different functions.  Red blood cells carry oxygen and are used for blood loss replacement.  White blood cells fight against infection.  Platelets control  bleeding.  Plasma helps clot blood.  Other blood products are available for specialized needs, such as hemophilia or other clotting disorders. BEFORE THE TRANSFUSION  Who gives blood for transfusions?   You may be able to donate blood to be used at a later date on yourself (autologous donation).  Relatives can be asked to donate blood. This is generally not any safer than if you have received blood from a stranger. The same precautions are taken to ensure safety when a relative's blood is donated.  Healthy volunteers who are fully evaluated to make sure their blood is safe. This is blood bank blood. Transfusion therapy is the safest it has ever been in the practice of medicine. Before blood is taken from a donor, a complete history is taken to make sure that person has no history of diseases nor engages in risky social behavior (examples are intravenous drug use or sexual activity with multiple partners). The donor's travel history is screened to minimize risk of transmitting infections, such as malaria. The donated blood is tested for signs of infectious diseases, such as HIV and hepatitis. The blood is then tested to be sure it is compatible with you in order to minimize the chance of a transfusion reaction. If you or a relative donates blood, this is often done in anticipation of surgery and is not appropriate for emergency situations. It takes many days to process the donated blood. RISKS AND COMPLICATIONS Although transfusion therapy is very safe and saves many lives, the main dangers of transfusion include:   Getting an infectious disease.  Developing a transfusion reaction. This is an allergic reaction to something in the blood you were given. Every precaution is taken to prevent this. The decision to have a blood transfusion has been considered carefully by your caregiver before blood is given. Blood is not given unless the benefits outweigh the risks. AFTER THE TRANSFUSION  Right  after receiving a blood transfusion, you will usually feel much better and more energetic. This is especially true if your red blood cells have gotten low (anemic). The transfusion raises the level of the red blood cells which carry oxygen, and this usually causes an energy increase.  The nurse administering the transfusion will monitor you carefully for complications. HOME CARE INSTRUCTIONS  No special instructions are needed after a transfusion. You may find your energy is better. Speak with your caregiver about any limitations on activity for underlying diseases you may have. SEEK MEDICAL CARE IF:   Your condition is not improving after your transfusion.  You develop redness or irritation at the intravenous (IV) site. SEEK IMMEDIATE MEDICAL CARE IF:  Any of the following symptoms occur over the next 12 hours:  Shaking chills.  You have a temperature by mouth above 102 F (38.9 C), not controlled by medicine.  Chest, back, or muscle pain.  People around you feel you are not acting correctly or are confused.  Shortness of breath or difficulty breathing.  Dizziness and fainting.  You get a rash or develop hives.  You have a decrease in urine output.  Your urine turns a dark color or changes to pink, red, or brown. Any of the following symptoms occur over the next 10 days:  You have a temperature by mouth above 102 F (38.9 C), not controlled by medicine.  Shortness of breath  Weakness after normal activity.  The white part of the eye turns yellow (jaundice).  You have a decrease in the amount of urine or are urinating less often.  Your urine turns a dark color or changes to pink, red, or brown. Document Released: 07/18/2000 Document Revised: 10/13/2011 Document Reviewed: 03/06/2008  [facility] Patient Information 2014 [place], [place].  Blood Products Information This is information about transfusions of blood products. All blood that is to be transfused is tested for  blood type, compatibility with the recipient, and for infections. Except in emergencies, giving a transfusion requires a written consent. Blood transfusions are often given as packed red blood cells. This means the other parts of the blood have been taken out. Blood may be needed to treat severe anemia or bleeding. Other blood products include plasma, platelets, immune globulin, and cryoprecipitate. Blood for transfusion is mostly donated by volunteers. The blood donors are carefully screened for risk factors that could cause disease. Donors are all tested for infections that could be transmitted by blood. The blood product supply today is the safest it has ever been. Some risks do remain.  A minor reaction with fever, chills, or rash happens in about 1% of blood product transfusions.  Life-threatening reactions occur in less than 1 in a million transfusions.  Infection with germs (bacteria), viruses or parasites like malaria can still happen. The risk is very low.  Hepatitis B occurs in about 1 case in 150,000 transfusions.  Hepatitis C is seen once in 500,000.  HIV is transmitted less than once every million transfusions. When you receive a transfusion of packed red blood cells, your blood is tested for blood group and Rh type. Your blood is also screened for antibodies that could cause a serious reaction. A cross-match test is done to make sure the blood is safe to give.  Talk with your caregiver if you have any concerns about receiving a transfusion of blood products. Make sure your questions are answered. Transfusions are not given if your caregiver feels the risk is greater than the need. Document Released: 07/21/2005 Document Revised: 10/13/2011 Document Reviewed: 01/08/2007 [facility] Patient Information 2014 [place], [place].

## 2013-02-15 NOTE — Progress Notes (Signed)
OFFICE PROGRESS NOTE  CC**  Christine Mew, MD [address] 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   PRIOR THERAPY:  1. Patient has history of right breast cancer patient underwent a mastectomy when she was 57 years old. This occurred during her pregnancy. After that patient did not take any kind of further treatments. She continued to do well.   2.Recently she noted changes in her nipple had become itchy and retracted. Because of this she underwent mammogram with ultrasound that showed 2 areas in the left breast one at 12:00 position and a second at 3:00 position. She also had an ultrasound performed that showed the diameter to be at least 6 cm and multiple left axillary lymph nodes that were abnormal. She then went on to have a biopsy performed on 09/30/2012. 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 Patient went on to have MRIs of the breasts performed and was noted to be marked diffuse enhancement involving majority of the left breast measuring 13.0 x 12.5 x 9.5 cm. There was diffuse skin thickening and dermal enhancement. Multiple enlarged level I left axillary lymph nodes. The largest lymph node measured 2.4 cm in size. The large area of enhancement within the left breast extends posteriorly to abut the anterior portion of the pectoralis muscle. There was no evidence of internal mammary adenopathy or right axillary adenopathy.  PET/CT did show the left breast mass, and left axillary and subpectoralis nodal metastases, but no further areas throughout the chest/abd/pelvis of metastases.   3.  Patient currently undergoing  neoadjuvant chemotherapy with FEC x 6 cycles starting on 11/02/12, which will be followed by 12 cycles of weekly Taxol/Carbo that started on 02/08/13.  CURRENT THERAPY: Taxol Carbo week 2  INTERVAL HISTORY: Christine Christine [name] 57 y.o. Christine [name] returns for follow up today prior to her second cycle of Taxol/Carbo.  She is doing well today.  Other than fatigue, she denies fevers, chills, nausea, vomiting, numbness, or any other concerns.  Otherwise, a 10 point ROS is neg.   MEDICAL HISTORY: Past Medical History  Diagnosis Date  . ALLERGIC RHINITIS   . GERD (gastroesophageal reflux disease)   . Hypertension   . Hypothyroidism   . Hx of colonic polyps   . Wears glasses   . Breast cancer 43, age 40    right  . Breast cancer 09/30/12    left, ER/PR -, Her 2 -    ALLERGIES:  is allergic to sulfa antibiotics and sulfonamide derivatives.  MEDICATIONS:  Current Outpatient Prescriptions  Medication Sig Dispense Refill  . dexamethasone (DECADRON) 4 MG tablet TAKE 2 TABS PO QD ON DAY AFTER CHEMO THEN 2 TABS BID X 2 DAYS  30 tablet  1  . diltiazem (CARDIZEM CD) 240 MG 24 hr capsule Take 1 capsule (240 mg total) by mouth daily.  90 capsule  2  . diltiazem (CARDIZEM CD) 240 MG 24 hr capsule TAKE ONE CAPSULE BY MOUTH EVERY DAY  90 capsule  3  . doxycycline (VIBRA-TABS) 100 MG tablet Take 1 tablet (100 mg total) by mouth 2 (two) times daily.  28 tablet  2  . fluconazole (DIFLUCAN) 200 MG tablet  Take 1 tablet by mouth every other day.      . gabapentin (NEURONTIN) 100 MG capsule Take 1 capsule (100 mg total) by mouth 3 (three) times daily.  90 capsule  2  . hydrochlorothiazide (MICROZIDE) 12.5 MG capsule TAKE 1 CAPSULE (12.5 MG TOTAL) BY MOUTH EVERY MORNING.  90 capsule  2  . latanoprost (XALATAN) 0.005 % ophthalmic solution Place 1 drop into both eyes at bedtime.       Marland Kitchen levothyroxine (SYNTHROID, LEVOTHROID) 50 MCG tablet TAKE 1 TABLET (50 MCG TOTAL) BY MOUTH DAILY.  90 tablet  2  . lidocaine-prilocaine  (EMLA) cream Apply topically as needed.  30 g  6  . LORazepam (ATIVAN) 0.5 MG tablet Take 1 tablet (0.5 mg total) by mouth every 6 (six) hours as needed (Nausea or vomiting).  30 tablet  0  . NEXIUM 40 MG capsule TAKE 1 CAPSULE (40 MG TOTAL) BY MOUTH DAILY BEFORE BREAKFAST.  90 capsule  1  . nystatin (MYCOSTATIN) 100000 UNIT/ML suspension TAKE 5 MLS BY MOUTH 4 (FOUR) TIMES DAILY.  180 mL  0  . PATADAY 0.2 % SOLN       . potassium chloride SA (KLOR-CON M20) 20 MEQ tablet Take 1 tablet (20 mEq total) by mouth daily.  30 tablet  0   No current facility-administered medications for this visit.    SURGICAL HISTORY:  Past Surgical History  Procedure Laterality Date  . Tonsillectomy    . Caesarean section      x 1  . Mastectomy  04/1987    right side  . Abdominal hysterectomy  06/1988    fibroids  . Portacath placement Right 10/28/2012    Procedure: PORT PLACEMENT;  Surgeon: Clovis Pu. Cornett, MD;  Location: [facility];  Service: General;  Laterality: Right;  Right Subclavian Vein    REVIEW OF SYSTEMS:  General: fatigue (+), night sweats (-), fever (-), pain (-) Lymph: palpable nodes (-) HEENT: vision changes (-), mucositis (-), gum bleeding (-), epistaxis (-) Cardiovascular: chest pain (-), palpitations (-) Pulmonary: shortness of breath (-), dyspnea on exertion (-), cough (-), hemoptysis (-) GI:  Early satiety (-), melena (-), dysphagia (-), nausea/vomiting (-), diarrhea (-) GU: dysuria (-), hematuria (-), incontinence (-) Musculoskeletal: joint swelling (-), joint pain (-), back pain (-) Neuro: weakness (-), numbness (-), headache (-), confusion (-) Skin: Rash (-), lesions (-), dryness (-) Psych: depression (-), suicidal/homicidal ideation (-), feeling of hopelessness (-)   PHYSICAL EXAMINATION: Blood pressure 129/81, pulse 104, temperature 99 F (37.2 C), temperature source Oral, resp. rate 18, height 5\' 4"  (1.626 m), weight 209 lb (94.802 kg). Body mass index is  35.86 kg/(m^2). General: Patient is a well appearing Christine [name] in no acute distress HEENT: PERRLA, sclerae anicteric no conjunctival pallor, MMM. Ulcer on left lateral tongue and right buccal mucosa Neck: supple, no palpable adenopathy Lungs: clear to auscultation bilaterally, no wheezes, rhonchi, or rales Cardiovascular: regular rate rhythm, S1, S2, no murmurs, rubs or gallops Abdomen: Soft, non-tender, non-distended, normoactive bowel sounds, no HSM Extremities: warm and well perfused, no clubbing, cyanosis, or edema Skin: No rashes or lesions Neuro: Non-focal Breasts: Right mastectomy site without nodularity or sign of recurrence.  Left breast mass palpable along with dimpling of skin and inversion of the nipple--much softer.  Right chest wall port now closed ECOG PERFORMANCE STATUS: 1 - Symptomatic but completely ambulatory  LABORATORY DATA: Lab Results  Component Value Date   WBC 9.2 02/15/2013   HGB 8.3* 02/15/2013  HCT 25.3* 02/15/2013   MCV 94.4 02/15/2013   PLT 210 02/15/2013      Chemistry      Component Value Date/Time   NA 145 02/08/2013 1237   NA 144 10/28/2012 1248   K 3.1* 02/08/2013 1237   K 3.9 10/28/2012 1248   CL 101 01/25/2013 0952   CL 108 10/28/2012 1248   CO2 30* 02/08/2013 1237   CO2 29 09/13/2011 1910   BUN 10.7 02/08/2013 1237   BUN 10 10/28/2012 1248   CREATININE 0.9 02/08/2013 1237   CREATININE 0.80 10/28/2012 1248      Component Value Date/Time   CALCIUM 8.9 02/08/2013 1237   CALCIUM 9.4 09/13/2011 1910   ALKPHOS 87 02/08/2013 1237   ALKPHOS 78 09/13/2011 1910   AST 25 02/08/2013 1237   AST 18 09/13/2011 1910   ALT 24 02/08/2013 1237   ALT 12 09/13/2011 1910   BILITOT 0.32 02/08/2013 1237   BILITOT 0.3 09/13/2011 1910       RADIOGRAPHIC STUDIES:  Ct Chest W Contrast  10/27/2012  *RADIOLOGY REPORT*  Clinical Data:  High risk breast cancer staging.  Right breast cancer diagnosed 1988.  Left breast cancer diagnosed February 2014.  CT CHEST, ABDOMEN AND PELVIS WITH CONTRAST   Technique:  Multidetector CT imaging of the chest, abdomen and pelvis was performed following the standard protocol during bolus administration of intravenous contrast.  Contrast: OMNIPAQUE IOHEXOL 300 MG/ML  SOLN  Comparison:  PET CT scan 03/26 1014  CT CHEST  Findings:  Right breast mastectomy anatomy.  There is thickening of the skin of the left breast to 11 mm.  There is a mass within the deep posterior left breast measuring 29 x 30 mm (image 6).  There are enlarged round abnormal lymph nodes in the left axilla measuring up to 20 mm short axis (image 22).  Lymph nodes extend beneath the sub pectoralis muscles on the left (image 12).  There is no evidence of internal mammary lymphadenopathy.  No infraclavicular adenopathy.  No mediastinal adenopathy.  No pericardial fluid.  No suspicious pulmonary nodules.  IMPRESSION:  1.  Left breast mass consistent with breast carcinoma. 2.  Skin thickening over the left breast is concerning for inflammatory breast cancer. 3.  Nodal metastasis in the left axilla and sub pectoralis nodal stations. 4.  No evidence of central nodal metastasis or mediastinal metastasis  CT ABDOMEN AND PELVIS  Findings:  No focal hepatic lesion.  The gallbladder, pancreas, spleen, adrenal gland is, and kidneys are normal.  The stomach, small bowel, appendix, cecum are normal.  The colon and rectosigmoid colon are normal.  Abdominal aorta is normal caliber.  No retroperitoneal or periportal lymphadenopathy.  No mesenteric or peritoneal disease.  No free fluid in  the pelvis.  Post hysterectomy anatomy.  No pelvic lymphadenopathy. Review of  bone windows demonstrates no aggressive osseous lesions.  IMPRESSION:  No evidence of metastasis in the abdomen or pelvis.   Original Report Authenticated By: Genevive Bi, M.D.    Ct Abdomen Pelvis W Contrast  10/27/2012  *RADIOLOGY REPORT*  Clinical Data:  High risk breast cancer staging.  Right breast cancer diagnosed 1988.  Left breast cancer  diagnosed February 2014.  CT CHEST, ABDOMEN AND PELVIS WITH CONTRAST  Technique:  Multidetector CT imaging of the chest, abdomen and pelvis was performed following the standard protocol during bolus administration of intravenous contrast.  Contrast: OMNIPAQUE IOHEXOL 300 MG/ML  SOLN  Comparison:  PET CT scan 03/26  1014  CT CHEST  Findings:  Right breast mastectomy anatomy.  There is thickening of the skin of the left breast to 11 mm.  There is a mass within the deep posterior left breast measuring 29 x 30 mm (image 6).  There are enlarged round abnormal lymph nodes in the left axilla measuring up to 20 mm short axis (image 22).  Lymph nodes extend beneath the sub pectoralis muscles on the left (image 12).  There is no evidence of internal mammary lymphadenopathy.  No infraclavicular adenopathy.  No mediastinal adenopathy.  No pericardial fluid.  No suspicious pulmonary nodules.  IMPRESSION:  1.  Left breast mass consistent with breast carcinoma. 2.  Skin thickening over the left breast is concerning for inflammatory breast cancer. 3.  Nodal metastasis in the left axilla and sub pectoralis nodal stations. 4.  No evidence of central nodal metastasis or mediastinal metastasis  CT ABDOMEN AND PELVIS  Findings:  No focal hepatic lesion.  The gallbladder, pancreas, spleen, adrenal gland is, and kidneys are normal.  The stomach, small bowel, appendix, cecum are normal.  The colon and rectosigmoid colon are normal.  Abdominal aorta is normal caliber.  No retroperitoneal or periportal lymphadenopathy.  No mesenteric or peritoneal disease.  No free fluid in  the pelvis.  Post hysterectomy anatomy.  No pelvic lymphadenopathy. Review of  bone windows demonstrates no aggressive osseous lesions.  IMPRESSION:  No evidence of metastasis in the abdomen or pelvis.   Original Report Authenticated By: Genevive Bi, M.D.    Mr Breast Bilateral W Wo Contrast  10/12/2012  *RADIOLOGY REPORT*  Clinical Data: History of previous  malignant mastectomy of the right breast 25 years ago.  The patient developed diffuse skin thickening and firmness within the left breast.  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  BUN and creatinine were obtained on site at [facility] at [address] Results:  BUN 7 mg/dL,  Creatinine 1.0 mg/dL.  BILATERAL BREAST MRI WITH AND WITHOUT CONTRAST  Technique: Multiplanar, multisequence MR images of both breasts were obtained prior to and following the intravenous administration of 20ml of Multihance.  Three dimensional images were evaluated at the independent DynaCad workstation.  Comparison:  Mammograms dated 09/30/2012 and 09/28/2012.  Findings: There has been a previous right mastectomy.  There is marked diffuse enhancement involving the majority of the left breast.  This measures 13.0 x 12.5 x 9.5 cm in size.  This is associated with a mixture of plateau and washout enhancement kinetics.  In addition,  there is diffuse skin thickening and dermal enhancement.  There are multiple enlarged level I left axillary lymph nodes.  The largest lymph node measures 2.4 cm in size.  The large area of enhancement within the left breast extends posteriorly to abut the anterior portion of the pectoral muscle. There is no evidence for internal mammary adenopathy or right axillary adenopathy.  There are no additional findings.  IMPRESSION:  1.  Large ( 13 cm) area of enhancement involving the majority of the left breast consistent with the patient's known invasive ductal carcinoma and DCIS. This extends posteriorly to abut the pectoralis muscle. 2.  Multiple enlarged level I left axillary lymph nodes. 3.  Previous right mastectomy.  RECOMMENDATION: Treatment plan  THREE-DIMENSIONAL MR IMAGE RENDERING ON INDEPENDENT WORKSTATION:  Three-dimensional MR images were rendered by post-processing of  the original MR data on an  independent workstation.  The three- dimensional MR images were interpreted, and findings were reported in the accompanying complete MRI report for this study.  BI-RADS CATEGORY 6:  Known biopsy-proven malignancy - appropriate action should be taken.   Original Report Authenticated By: Rolla Plate, M.D.    Nm Pet Image Initial (pi) Skull Base To Thigh  10/27/2012  *RADIOLOGY REPORT*  Clinical Data: Initial treatment strategy for high risk of breast cancer. Triple negative  NUCLEAR MEDICINE PET SKULL BASE TO THIGH  Fasting Blood Glucose:  97  Technique:  17.9 mCi F-18 FDG was injected intravenously. CT data was obtained and used for attenuation correction and anatomic localization only.  (This was not acquired as a diagnostic CT examination.) Additional exam technical data entered on technologist worksheet.  Comparison:  Findings:  Neck: No hypermetabolic lymph nodes in the neck.  Chest:  There is intense metabolic activity ( SUV max = 16.1) associated with a large portion of the glandular tissue of the left breast consistent with carcinoma.  There are intensely hypermetabolic left axillary lymph nodes with SUV max = 15.1.  These nodes are rounded and measure up to 2 cm. Hypermetabolic nodes extend to the sub pectoralis nodal station (image 71).  There is a single hypermetabolic internal mammary lymph node measuring 5 mm just left of the sternum (image 87).  This has intense metabolic activity for size with SUV max = 7.1.  No hypermetabolic mediastinal lymph nodes.  No hypermetabolic pulmonary nodules.  Abdomen/Pelvis:  No abnormal hypermetabolic activity within the liver, pancreas, adrenal glands, or spleen.  No hypermetabolic lymph nodes in the abdomen or pelvis.  Skeleton:  No focal hypermetabolic activity to suggest skeletal metastasis.  IMPRESSION:  1.  Hypermetabolic breast tissue on the left consistent with primary carcinoma. 2.  Hypermetabolic nodal metastasis in the left  axilla extending to the sub pectoralis nodes. 3.  Single hypermetabolic left internal mammary lymph node.  4.  No evidence of pulmonary metastasis.  5.  No evidence of metastasis beneath hemidiaphragms or within the skeleton.   Original Report Authenticated By: Genevive Bi, M.D.    Dg Chest Port 1 View  10/28/2012  *RADIOLOGY REPORT*  Clinical Data: Port-A-Cath placement.  PORTABLE CHEST - 1 VIEW  Comparison: None.  Findings: The patient is rotated on the study.  Right side Port-A- Cath is in place.  Tubing appears intact.  Tip of the catheter projects in the mid to lower superior vena cava.  No pneumothorax identified.  Heart size is normal.  Lungs are clear.  The patient is status post right mastectomy and axillary dissection.  IMPRESSION: Tip of Port-A-Cath projects over the mid to lower superior vena cava.  The patient is rotated on the study.  Exact position of the tip of the catheter could be determined with the lateral film. No pneumothorax.   Original Report Authenticated By: Holley Dexter, M.D.    Dg Fluoro Guide Cv Line-no Report  10/28/2012  CLINICAL DATA: [facility]   FLOURO GUIDE CV LINE  Fluoroscopy was utilized by the requesting physician.  No radiographic  interpretation.      ASSESSMENT:   57 year old Christine [name] with  #1 history of breast cancer of the right breast status post mastectomy when she was 57 years old. Due to this she was referred for genetic testing.   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 Patient's tumor is ER  negative PR negative HER-2/neu negative with a Ki-67 of 21%. Patient is being seen in medical oncology for neoadjuvant chemotherapy. Patient and I discussed her pathology in detail. We discussed treatment for breast cancer including surgical and with chemotherapy. Patient understands that she may still need a mastectomy however by giving her neoadjuvant  chemotherapy her surgery may be made a little bit easier hopefully with giving her negative margins. She understands that neoadjuvant chemotherapy will also treat her distant disease.I discussed type of chemotherapy that she should have. This would include dose dense FEC x6 cycles followed by weekly Taxol carboplatinum for 12 weeks.  #3 Patient underwent staging studies with PET/CT, and received echo, port placement and chemotherapy class.    #4 Open port wound, healed  #5 Hypokalemia  PLAN:   1. Doing well.  She will proceed with chemotherapy.  She is anemic.  I gave her multiple handouts about anemia, symptoms of anemia, risks of a blood transfusion, and what a blood transfusion entails.  She will call me immediately if she becomes more fatigued, or develops any symptoms we discussed.    2. Christine Christine [name] will return next week for labs, an appt and cycle 3 of Taxol/Carbo and possible blood transfusion.      3. We will f/u on her potassium today and give her instructions.    All questions were answered. The patient knows to call the clinic with any problems, questions or concerns. We can certainly see the patient much sooner if necessary.  I spent 25 minutes counseling the patient face to face. The total time spent in the appointment was 30 minutes.   Cherie Ouch Lyn Hollingshead, NP Medical Oncology [facility] Phone: [phone] 02/15/2013, 2:26 PM

## 2013-02-18 ENCOUNTER — Ambulatory Visit (INDEPENDENT_AMBULATORY_CARE_PROVIDER_SITE_OTHER): Payer: BC Managed Care – PPO | Admitting: Surgery

## 2013-02-18 ENCOUNTER — Encounter (INDEPENDENT_AMBULATORY_CARE_PROVIDER_SITE_OTHER): Payer: Self-pay | Admitting: Surgery

## 2013-02-18 VITALS — BP 118/80 | HR 84 | Resp 18 | Ht 64.0 in | Wt 205.6 lb

## 2013-02-18 DIAGNOSIS — Z853 Personal history of malignant neoplasm of breast: Secondary | ICD-10-CM

## 2013-02-18 NOTE — Progress Notes (Signed)
Subjective:     Patient ID: Christine [name], female   DOB: Aug 11, 1955, 57 y.o.   MRN: [id number]  HPI history of breast cancer of the right breast status post mastectomy when she was 57 years old. Due to this she was referred for genetic testing.  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 Patient's tumor is ER negative PR negative HER-2/neu negative with a Ki-67 of 21%. Patient is being seen in medical oncology for neoadjuvant chemotherapy. Patient and I discussed her pathology in detail. We discussed treatment for breast cancer including surgical and with chemotherapy. Patient understands that she may still need a mastectomy however by giving her neoadjuvant chemotherapy her surgery may be made a little bit easier hopefully with giving her negative margins. She understands that neoadjuvant chemotherapy will also treat her distant disease.I discussed type of chemotherapy that she should have. This would include dose dense FEC x6 cycles followed by weekly Taxol carboplatinum for 12 weeks.  #3 Patient underwent staging studies with PET/CT, and received echo, port placement and chemotherapy class. Here for followup. Her port site is finally healed and is functioning well. She is halfway through her treatments. Feeling better overall.   Review of Systems  Constitutional: Positive for fatigue.  Respiratory: Negative.   Cardiovascular: Negative.   Hematological: Positive for adenopathy. Bruises/bleeds easily.       Objective:   Physical Exam  Constitutional: She appears well-developed and well-nourished.  Neck: Normal range of motion.  Pulmonary/Chest:    Musculoskeletal: Normal range of motion.  Skin: Skin is warm and dry.  Psychiatric: She has a normal mood and affect. Her behavior is normal. Judgment and thought content normal.       Assessment:     Locally advanced left breast cancer  undergoing neoadjuvant chemotherapy    Plan:     Return to clinic October with chemotherapy done. MRI to establish response. We'll need mastectomy with lymph node dissection once therapy done.

## 2013-02-18 NOTE — Patient Instructions (Signed)
Return in October. 

## 2013-02-22 ENCOUNTER — Ambulatory Visit (HOSPITAL_BASED_OUTPATIENT_CLINIC_OR_DEPARTMENT_OTHER): Payer: BC Managed Care – PPO

## 2013-02-22 ENCOUNTER — Ambulatory Visit (HOSPITAL_BASED_OUTPATIENT_CLINIC_OR_DEPARTMENT_OTHER): Payer: BC Managed Care – PPO | Admitting: Adult Health

## 2013-02-22 ENCOUNTER — Encounter: Payer: Self-pay | Admitting: Adult Health

## 2013-02-22 ENCOUNTER — Encounter (HOSPITAL_COMMUNITY)
Admission: RE | Admit: 2013-02-22 | Discharge: 2013-02-22 | Disposition: A | Discharge status: Home / Self Care | Payer: BC Managed Care – PPO | Source: Ambulatory Visit | Attending: Adult Health | Admitting: Adult Health

## 2013-02-22 ENCOUNTER — Other Ambulatory Visit (HOSPITAL_BASED_OUTPATIENT_CLINIC_OR_DEPARTMENT_OTHER): Payer: BC Managed Care – PPO | Admitting: Lab

## 2013-02-22 ENCOUNTER — Encounter: Payer: Self-pay | Admitting: Oncology

## 2013-02-22 VITALS — BP 126/80 | HR 103 | Temp 97.5°F | Resp 20 | Ht 64.0 in | Wt 210.8 lb

## 2013-02-22 DIAGNOSIS — D649 Anemia, unspecified: Secondary | ICD-10-CM

## 2013-02-22 DIAGNOSIS — C50919 Malignant neoplasm of unspecified site of unspecified female breast: Secondary | ICD-10-CM

## 2013-02-22 DIAGNOSIS — Z5111 Encounter for antineoplastic chemotherapy: Secondary | ICD-10-CM

## 2013-02-22 DIAGNOSIS — T451X5A Adverse effect of antineoplastic and immunosuppressive drugs, initial encounter: Secondary | ICD-10-CM | POA: Insufficient documentation

## 2013-02-22 DIAGNOSIS — C773 Secondary and unspecified malignant neoplasm of axilla and upper limb lymph nodes: Secondary | ICD-10-CM

## 2013-02-22 DIAGNOSIS — C50912 Malignant neoplasm of unspecified site of left female breast: Secondary | ICD-10-CM

## 2013-02-22 DIAGNOSIS — E876 Hypokalemia: Secondary | ICD-10-CM

## 2013-02-22 DIAGNOSIS — D6481 Anemia due to antineoplastic chemotherapy: Secondary | ICD-10-CM | POA: Insufficient documentation

## 2013-02-22 LAB — CBC WITH DIFFERENTIAL/PLATELET
Basophils Absolute: 0 10*3/uL (ref 0.0–0.1)
EOS%: 0.2 % (ref 0.0–7.0)
HGB: 8.1 g/dL — ABNORMAL LOW (ref 11.6–15.9)
LYMPH%: 24.7 % (ref 14.0–49.7)
MCH: 31.2 pg (ref 25.1–34.0)
MCV: 96.9 fL (ref 79.5–101.0)
MONO%: 10.5 % (ref 0.0–14.0)
Platelets: 198 10*3/uL (ref 145–400)
RDW: 17.4 % — ABNORMAL HIGH (ref 11.2–14.5)

## 2013-02-22 LAB — COMPREHENSIVE METABOLIC PANEL (CC13)
Alkaline Phosphatase: 79 U/L (ref 40–150)
BUN: 10 mg/dL (ref 7.0–26.0)
Creatinine: 0.8 mg/dL (ref 0.6–1.1)
Glucose: 156 mg/dl — ABNORMAL HIGH (ref 70–140)
Sodium: 142 mEq/L (ref 136–145)
Total Bilirubin: 0.29 mg/dL (ref 0.20–1.20)
Total Protein: 6.3 g/dL — ABNORMAL LOW (ref 6.4–8.3)

## 2013-02-22 MED ORDER — PACLITAXEL CHEMO INJECTION 300 MG/50ML
80.0000 mg/m2 | Freq: Once | INTRAVENOUS | Status: AC
  Administered 2013-02-22: 162 mg via INTRAVENOUS
  Filled 2013-02-22: qty 27

## 2013-02-22 MED ORDER — SODIUM CHLORIDE 0.9 % IV SOLN
280.0000 mg | Freq: Once | INTRAVENOUS | Status: AC
  Administered 2013-02-22: 280 mg via INTRAVENOUS
  Filled 2013-02-22: qty 28

## 2013-02-22 MED ORDER — DIPHENHYDRAMINE HCL 50 MG/ML IJ SOLN
50.0000 mg | Freq: Once | INTRAMUSCULAR | Status: AC
  Administered 2013-02-22: 50 mg via INTRAVENOUS

## 2013-02-22 MED ORDER — HEPARIN SOD (PORK) LOCK FLUSH 100 UNIT/ML IV SOLN
500.0000 [IU] | Freq: Once | INTRAVENOUS | Status: AC | PRN
  Administered 2013-02-22: 500 [IU]
  Filled 2013-02-22: qty 5

## 2013-02-22 MED ORDER — POTASSIUM CHLORIDE CRYS ER 20 MEQ PO TBCR
40.0000 meq | EXTENDED_RELEASE_TABLET | Freq: Once | ORAL | Status: AC
  Administered 2013-02-22: 40 meq via ORAL
  Filled 2013-02-22: qty 2

## 2013-02-22 MED ORDER — SODIUM CHLORIDE 0.9 % IJ SOLN
10.0000 mL | INTRAMUSCULAR | Status: DC | PRN
  Administered 2013-02-22: 10 mL
  Filled 2013-02-22: qty 10

## 2013-02-22 MED ORDER — POTASSIUM CHLORIDE CRYS ER 20 MEQ PO TBCR: 40.0000 meq | EXTENDED_RELEASE_TABLET | Freq: Two times a day (BID) | ORAL | Status: DC

## 2013-02-22 MED ORDER — DEXAMETHASONE SODIUM PHOSPHATE 20 MG/5ML IJ SOLN
20.0000 mg | Freq: Once | INTRAMUSCULAR | Status: AC
  Administered 2013-02-22: 20 mg via INTRAVENOUS

## 2013-02-22 MED ORDER — FAMOTIDINE IN NACL 20-0.9 MG/50ML-% IV SOLN: 20.0000 mg | Freq: Once | INTRAVENOUS | Status: DC

## 2013-02-22 MED ORDER — SODIUM CHLORIDE 0.9 % IV SOLN
Freq: Once | INTRAVENOUS | Status: AC
  Administered 2013-02-22: 15:00:00 via INTRAVENOUS

## 2013-02-22 MED ORDER — ONDANSETRON 16 MG/50ML IVPB (CHCC)
16.0000 mg | Freq: Once | INTRAVENOUS | Status: AC
  Administered 2013-02-22: 16 mg via INTRAVENOUS

## 2013-02-22 MED ORDER — SODIUM CHLORIDE 0.9 % IV SOLN
50.0000 mg | Freq: Once | INTRAVENOUS | Status: AC
  Administered 2013-02-22: 50 mg via INTRAVENOUS
  Filled 2013-02-22: qty 2

## 2013-02-22 NOTE — Patient Instructions (Addendum)
[facility] Discharge Instructions for Patients Receiving Chemotherapy  Today you received the following chemotherapy agents: Taxol and Carboplatin.  To help prevent nausea and vomiting after your treatment, we encourage you to take your nausea medication as prescribed.   If you develop nausea and vomiting that is not controlled by your nausea medication, call the clinic.   BELOW ARE SYMPTOMS THAT SHOULD BE REPORTED IMMEDIATELY:  *FEVER GREATER THAN 100.5 F  *CHILLS WITH OR WITHOUT FEVER  NAUSEA AND VOMITING THAT IS NOT CONTROLLED WITH YOUR NAUSEA MEDICATION  *UNUSUAL SHORTNESS OF BREATH  *UNUSUAL BRUISING OR BLEEDING  TENDERNESS IN MOUTH AND THROAT WITH OR WITHOUT PRESENCE OF ULCERS  *URINARY PROBLEMS  *BOWEL PROBLEMS  UNUSUAL RASH Items with * indicate a potential emergency and should be followed up as soon as possible.  Feel free to call the clinic you have any questions or concerns. The clinic phone number is [phone].    

## 2013-02-22 NOTE — Patient Instructions (Addendum)
Blood Transfusion Information WHAT IS A BLOOD TRANSFUSION? A transfusion is the replacement of blood or some of its parts. Blood is made up of multiple cells which provide different functions.  Red blood cells carry oxygen and are used for blood loss replacement.  White blood cells fight against infection.  Platelets control bleeding.  Plasma helps clot blood.  Other blood products are available for specialized needs, such as hemophilia or other clotting disorders. BEFORE THE TRANSFUSION  Who gives blood for transfusions?   You may be able to donate blood to be used at a later date on yourself (autologous donation).  Relatives can be asked to donate blood. This is generally not any safer than if you have received blood from a stranger. The same precautions are taken to ensure safety when a relative's blood is donated.  Healthy volunteers who are fully evaluated to make sure their blood is safe. This is blood bank blood. Transfusion therapy is the safest it has ever been in the practice of medicine. Before blood is taken from a donor, a complete history is taken to make sure that person has no history of diseases nor engages in risky social behavior (examples are intravenous drug use or sexual activity with multiple partners). The donor's travel history is screened to minimize risk of transmitting infections, such as malaria. The donated blood is tested for signs of infectious diseases, such as HIV and hepatitis. The blood is then tested to be sure it is compatible with you in order to minimize the chance of a transfusion reaction. If you or a relative donates blood, this is often done in anticipation of surgery and is not appropriate for emergency situations. It takes many days to process the donated blood. RISKS AND COMPLICATIONS Although transfusion therapy is very safe and saves many lives, the main dangers of transfusion include:   Getting an infectious disease.  Developing a  transfusion reaction. This is an allergic reaction to something in the blood you were given. Every precaution is taken to prevent this. The decision to have a blood transfusion has been considered carefully by your caregiver before blood is given. Blood is not given unless the benefits outweigh the risks. AFTER THE TRANSFUSION  Right after receiving a blood transfusion, you will usually feel much better and more energetic. This is especially true if your red blood cells have gotten low (anemic). The transfusion raises the level of the red blood cells which carry oxygen, and this usually causes an energy increase.  The nurse administering the transfusion will monitor you carefully for complications. HOME CARE INSTRUCTIONS  No special instructions are needed after a transfusion. You may find your energy is better. Speak with your caregiver about any limitations on activity for underlying diseases you may have. SEEK MEDICAL CARE IF:   Your condition is not improving after your transfusion.  You develop redness or irritation at the intravenous (IV) site. SEEK IMMEDIATE MEDICAL CARE IF:  Any of the following symptoms occur over the next 12 hours:  Shaking chills.  You have a temperature by mouth above 102 F (38.9 C), not controlled by medicine.  Chest, back, or muscle pain.  People around you feel you are not acting correctly or are confused.  Shortness of breath or difficulty breathing.  Dizziness and fainting.  You get a rash or develop hives.  You have a decrease in urine output.  Your urine turns a dark color or changes to pink, red, or brown. Any of the following   symptoms occur over the next 10 days:  You have a temperature by mouth above 102 F (38.9 C), not controlled by medicine.  Shortness of breath.  Weakness after normal activity.  The white part of the eye turns yellow (jaundice).  You have a decrease in the amount of urine or are urinating less often.  Your  urine turns a dark color or changes to pink, red, or brown. Document Released: 07/18/2000 Document Revised: 10/13/2011 Document Reviewed: 03/06/2008 [facility] Patient Information 2014 [place], [place]. Blood Products Information This is information about transfusions of blood products. All blood that is to be transfused is tested for blood type, compatibility with the recipient, and for infections. Except in emergencies, giving a transfusion requires a written consent. Blood transfusions are often given as packed red blood cells. This means the other parts of the blood have been taken out. Blood may be needed to treat severe anemia or bleeding. Other blood products include plasma, platelets, immune globulin, and cryoprecipitate. Blood for transfusion is mostly donated by volunteers. The blood donors are carefully screened for risk factors that could cause disease. Donors are all tested for infections that could be transmitted by blood. The blood product supply today is the safest it has ever been. Some risks do remain.  A minor reaction with fever, chills, or rash happens in about 1% of blood product transfusions.  Life-threatening reactions occur in less than 1 in a million transfusions.  Infection with germs (bacteria), viruses or parasites like malaria can still happen. The risk is very low.  Hepatitis B occurs in about 1 case in 150,000 transfusions.  Hepatitis C is seen once in 500,000.  HIV is transmitted less than once every million transfusions. When you receive a transfusion of packed red blood cells, your blood is tested for blood group and Rh type. Your blood is also screened for antibodies that could cause a serious reaction. A cross-match test is done to make sure the blood is safe to give.  Talk with your caregiver if you have any concerns about receiving a transfusion of blood products. Make sure your questions are answered. Transfusions are not given if your caregiver feels the  risk is greater than the need. Document Released: 07/21/2005 Document Revised: 10/13/2011 Document Reviewed: 01/08/2007 [facility] Patient Information 2014 [place], [place]. Anemia, Frequently Asked Questions WHAT ARE THE SYMPTOMS OF ANEMIA?  Headache.  Difficulty thinking.  Fatigue.  Shortness of breath.  Weakness.  Rapid heartbeat. AT WHAT POINT ARE PEOPLE CONSIDERED ANEMIC?  This varies with gender and age.   Both hemoglobin (Hgb) and hematocrit values are used to define anemia. These lab values are obtained from a complete blood count (CBC) test. This is performed at a caregiver's office.  The normal range of hemoglobin values for adult men is 14.0 g/dL to 16.1 g/dL. For nonpregnant women, values are 12.3 g/dL to 09.6 g/dL.  The World Health Organization defines anemia as less than 12 g/dL for nonpregnant women and less than 13 g/dL for men.  For adult males, the average normal hematocrit is 46%, and the range is 40% to 52%.  For adult females, the average normal hematocrit is 41%, and the range is 35% to 47%.  Values that fall below the lower limits can be a sign of anemia and should have further checking (evaluation). GROUPS OF PEOPLE WHO ARE AT RISK FOR DEVELOPING ANEMIA INCLUDE:   Infants who are breastfed or taking a formula that is not fortified with iron.  Children going through  a rapid growth spurt. The iron available can not keep up with the needs for a red cell mass which must grow with the child.  Women in childbearing years. They need iron because of blood loss during menstruation.  Pregnant women. The growing fetus creates a high demand for iron.  People with ongoing gastrointestinal blood loss are at risk of developing iron deficiency.  Individuals with leukemia or cancer who must receive chemotherapy or radiation to treat their disease. The drugs or radiation used to treat these diseases often decreases the bone marrow's ability to make cells of all classes.  This includes red blood cells, white blood cells, and platelets.  Individuals with chronic inflammatory conditions such as rheumatoid arthritis or chronic infections.  The elderly. ARE SOME TYPES OF ANEMIA INHERITED?   Yes, some types of anemia are due to inherited or genetic defects.  Sickle cell anemia. This occurs most often in people of African, African American, and Mediterranean descent.  Thalassemia (or Cooley's anemia). This type is found in people of Mediterranean and Southeast Asian descent. These types of anemia are common.  Fanconi. This is rare. CAN CERTAIN MEDICATIONS CAUSE A PERSON TO BECOME ANEMIC?  Yes. For example, drugs to fight cancer (chemotherapeutic agents) often cause anemia. These drugs can slow the bone marrow's ability to make red blood cells. If there are not enough red blood cells, the body does not get enough oxygen. WHAT HEMATOCRIT LEVEL IS REQUIRED TO DONATE BLOOD?  The lower limit of an acceptable hematocrit for blood donors is 38%. If you have a low hematocrit value, you should schedule an appointment with your caregiver. ARE BLOOD TRANSFUSIONS COMMONLY USED TO CORRECT ANEMIA, AND ARE THEY DANGEROUS?  They are used to treat anemia as a last resort. Your caregiver will find the cause of the anemia and correct it if possible. Most blood transfusions are given because of excessive bleeding at the time of surgery, with trauma, or because of bone marrow suppression in patients with cancer or leukemia on chemotherapy. Blood transfusions are safer than ever before. We also know that blood transfusions affect the immune system and may increase certain risks. There is also a concern for human error. In 1/16,000 transfusions, a patient receives a transfusion of blood that is not matched with his or her blood type.  WHAT IS IRON DEFICIENCY ANEMIA AND CAN I CORRECT IT BY CHANGING MY DIET?  Iron is an essential part of hemoglobin. Without enough hemoglobin, anemia develops  and the body does not get the right amount of oxygen. Iron deficiency anemia develops after the body has had a low level of iron for a long time. This is either caused by blood loss, not taking in or absorbing enough iron, or increased demands for iron (like pregnancy or rapid growth).  Foods from animal origin such as beef, chicken, and pork, are good sources of iron. Be sure to have one of these foods at each meal. Vitamin C helps your body absorb iron. Foods rich in Vitamin C include citrus, bell pepper, strawberries, spinach and cantaloupe. In some cases, iron supplements may be needed in order to correct the iron deficiency. In the case of poor absorption, extra iron may have to be given directly into the vein through a needle (intravenously). I HAVE BEEN DIAGNOSED WITH IRON DEFICIENCY ANEMIA AND MY CAREGIVER PRESCRIBED IRON SUPPLEMENTS. HOW LONG WILL IT TAKE FOR MY BLOOD TO BECOME NORMAL?  It depends on the degree of anemia at the beginning of treatment.  Most people with mild to moderate iron deficiency, anemia will correct the anemia over a period of 2 to 3 months. But after the anemia is corrected, the iron stored by the body is still low. Caregivers often suggest an additional 6 months of oral iron therapy once the anemia has been reversed. This will help prevent the iron deficiency anemia from quickly happening again. Non-anemic adult males should take iron supplements only under the direction of a doctor, too much iron can cause liver damage.  MY HEMOGLOBIN IS 9 G/DL AND I AM SCHEDULED FOR SURGERY. SHOULD I POSTPONE THE SURGERY?  If you have Hgb of 9, you should discuss this with your caregiver right away. Many patients with similar hemoglobin levels have had surgery without problems. If minimal blood loss is expected for a minor procedure, no treatment may be necessary.  If a greater blood loss is expected for more extensive procedures, you should ask your caregiver about being treated with  erythropoietin and iron. This is to accelerate the recovery of your hemoglobin to a normal level before surgery. An anemic patient who undergoes high-blood-loss surgery has a greater risk of surgical complications and need for a blood transfusion, which also carries some risk.  I HAVE BEEN TOLD THAT HEAVY MENSTRUAL PERIODS CAUSE ANEMIA. IS THERE ANYTHING I CAN DO TO PREVENT THE ANEMIA?  Anemia that results from heavy periods is usually due to iron deficiency. You can try to meet the increased demands for iron caused by the heavy monthly blood loss by increasing the intake of iron-rich foods. Iron supplements may be required. Discuss your concerns with your caregiver. WHAT CAUSES ANEMIA DURING PREGNANCY?  Pregnancy places major demands on the body. The mother must meet the needs of both her body and her growing baby. The body needs enough iron and folate to make the right amount of red blood cells. To prevent anemia while pregnant, the mother should stay in close contact with her caregiver.  Be sure to eat a diet that has foods rich in iron and folate like liver and dark green leafy vegetables. Folate plays an important role in the normal development of a baby's spinal cord. Folate can help prevent serious disorders like spina bifida. If your diet does not provide adequate nutrients, you may want to talk with your caregiver about nutritional supplements.  WHAT IS THE RELATIONSHIP BETWEEN FIBROID TUMORS AND ANEMIA IN WOMEN?  The relationship is usually caused by the increased menstrual blood loss caused by fibroids. Good iron intake may be required to prevent iron deficiency anemia from developing.  Document Released: 02/27/2004 Document Revised: 10/13/2011 Document Reviewed: 08/13/2010 [facility] Patient Information 2014 [place], [place].

## 2013-02-22 NOTE — Progress Notes (Signed)
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   PRIOR THERAPY:  1. Patient has history of right breast cancer patient underwent a mastectomy when she was 57 years old. This occurred during her pregnancy. After that patient did not take any kind of further treatments. She continued to do well.   2.Recently she noted changes in her nipple had become itchy and retracted. Because of this she underwent mammogram with ultrasound that showed 2 areas in the left breast one at 12:00 position and a second at 3:00 position. She also had an ultrasound performed that showed the diameter to be at least 6 cm and multiple left axillary lymph nodes that were abnormal. She then went on to have a biopsy performed on 09/30/2012. 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 Patient went on to have MRIs of the breasts performed and was noted to be marked diffuse enhancement involving majority of the left breast measuring 13.0 x 12.5 x 9.5 cm. There was diffuse skin thickening and dermal enhancement. Multiple enlarged level I left axillary lymph nodes. The largest lymph node measured 2.4 cm in size. The large area of enhancement within the left breast extends posteriorly to abut the anterior portion of the pectoralis muscle. There was no evidence of internal mammary adenopathy or right axillary adenopathy.  PET/CT did show the left breast mass, and left axillary and subpectoralis nodal metastases, but no further areas throughout the chest/abd/pelvis of metastases.   3.  Patient currently undergoing  neoadjuvant chemotherapy with FEC x 6 cycles starting on 11/02/12, which will be followed by 12 cycles of weekly Taxol/Carbo that started on 02/08/13.  CURRENT THERAPY: Taxol Carbo week 3  INTERVAL HISTORY: Christine [name] 57 y.o. female returns for follow up today prior to her third cycle of Taxol/Carbo.  She is doing well today.  Other than fatigue, she denies fevers, chills, nausea, vomiting, numbness, or any other concerns.  She and I discussed a blood transfusion after chemotherapy last week due to her anemia.  She is agreeable to this due to her fatigue. Otherwise, a 10 point ROS is neg.   MEDICAL HISTORY: Past Medical History  Diagnosis Date  . ALLERGIC RHINITIS   . GERD (gastroesophageal reflux disease)   . Hypertension   . Hypothyroidism   . Hx of colonic polyps   . Wears glasses   . Breast cancer 49, age 53    right  . Breast cancer 09/30/12    left, ER/PR -, Her 2 -    ALLERGIES:  is allergic to sulfa antibiotics and sulfonamide derivatives.  MEDICATIONS:  Current Outpatient Prescriptions  Medication Sig Dispense Refill  . dexamethasone (DECADRON) 4 MG tablet TAKE 2 TABS PO QD ON DAY AFTER CHEMO THEN 2 TABS BID X 2 DAYS  30 tablet  1  . diltiazem (CARDIZEM CD) 240 MG 24 hr capsule Take 1 capsule (240 mg total) by mouth daily.  90 capsule  2  . diltiazem (CARDIZEM CD) 240 MG 24 hr capsule TAKE ONE CAPSULE BY MOUTH EVERY DAY  90 capsule  3  . doxycycline (VIBRA-TABS) 100 MG  tablet Take 1 tablet (100 mg total) by mouth 2 (two) times daily.  28 tablet  2  . fluconazole (DIFLUCAN) 200 MG tablet Take 1 tablet by mouth every other day.      . gabapentin (NEURONTIN) 100 MG capsule Take 1 capsule (100 mg total) by mouth 3 (three) times daily.  90 capsule  2  . hydrochlorothiazide (MICROZIDE) 12.5 MG capsule TAKE 1 CAPSULE (12.5 MG TOTAL) BY MOUTH EVERY MORNING.  90 capsule  2  . latanoprost (XALATAN) 0.005 % ophthalmic solution Place 1 drop into both eyes at bedtime.       Marland Kitchen  levothyroxine (SYNTHROID, LEVOTHROID) 50 MCG tablet TAKE 1 TABLET (50 MCG TOTAL) BY MOUTH DAILY.  90 tablet  2  . lidocaine-prilocaine (EMLA) cream Apply topically as needed.  30 g  6  . LORazepam (ATIVAN) 0.5 MG tablet Take 1 tablet (0.5 mg total) by mouth every 6 (six) hours as needed (Nausea or vomiting).  30 tablet  0  . NEXIUM 40 MG capsule TAKE 1 CAPSULE (40 MG TOTAL) BY MOUTH DAILY BEFORE BREAKFAST.  90 capsule  1  . nystatin (MYCOSTATIN) 100000 UNIT/ML suspension TAKE 5 MLS BY MOUTH 4 (FOUR) TIMES DAILY.  180 mL  0  . PATADAY 0.2 % SOLN       . potassium chloride SA (KLOR-CON M20) 20 MEQ tablet Take 1 tablet (20 mEq total) by mouth daily.  30 tablet  0   Current Facility-Administered Medications  Medication Dose Route Frequency Provider Last Rate Last Dose  . potassium chloride SA (K-DUR,KLOR-CON) CR tablet 40 mEq  40 mEq Oral Once Augustin Schooling, NP       Facility-Administered Medications Ordered in Other Visits  Medication Dose Route Frequency Provider Last Rate Last Dose  . CARBOplatin (PARAPLATIN) 280 mg in sodium chloride 0.9 % 100 mL chemo infusion  280 mg Intravenous Once Augustin Schooling, NP      . heparin lock flush 100 unit/mL  500 Units Intracatheter Once PRN Augustin Schooling, NP      . PACLitaxel (TAXOL) 162 mg in dextrose 5 % 250 mL chemo infusion (</= 80mg /m2)  80 mg/m2 (Treatment Plan Actual) Intravenous Once Augustin Schooling, NP 277 mL/hr at 02/22/13 1610 162 mg at 02/22/13 1610  . sodium chloride 0.9 % injection 10 mL  10 mL Intracatheter PRN Augustin Schooling, NP        SURGICAL HISTORY:  Past Surgical History  Procedure Laterality Date  . Tonsillectomy    . Caesarean section      x 1  . Mastectomy  04/1987    right side  . Abdominal hysterectomy  06/1988    fibroids  . Portacath placement Right 10/28/2012    Procedure: PORT PLACEMENT;  Surgeon: Clovis Pu. Cornett, MD;  Location: [facility];  Service: General;  Laterality: Right;  Right  Subclavian Vein    REVIEW OF SYSTEMS:  General: fatigue (+), night sweats (-), fever (-), pain (-) Lymph: palpable nodes (-) HEENT: vision changes (-), mucositis (-), gum bleeding (-), epistaxis (-) Cardiovascular: chest pain (-), palpitations (-) Pulmonary: shortness of breath (-), dyspnea on exertion (-), cough (-), hemoptysis (-) GI:  Early satiety (-), melena (-), dysphagia (-), nausea/vomiting (-), diarrhea (-) GU: dysuria (-), hematuria (-), incontinence (-) Musculoskeletal: joint swelling (-), joint pain (-), back pain (-) Neuro: weakness (-), numbness (-), headache (-), confusion (-) Skin: Rash (-), lesions (-), dryness (-) Psych: depression (-), suicidal/homicidal ideation (-), feeling of hopelessness (-)  PHYSICAL EXAMINATION: Blood pressure 126/80, pulse 103, temperature 97.5 F (36.4 C), temperature source Oral, resp. rate 20, height 5\' 4"  (1.626 m), weight 210 lb 12.8 oz (95.618 kg). Body mass index is 36.17 kg/(m^2). General: Patient is a well appearing female in no acute distress HEENT: PERRLA, sclerae anicteric no conjunctival pallor, MMM. Ulcer on left lateral tongue and right buccal mucosa Neck: supple, no palpable adenopathy Lungs: clear to auscultation bilaterally, no wheezes, rhonchi, or rales Cardiovascular: regular rate rhythm, S1, S2, no murmurs, rubs or gallops Abdomen: Soft, non-tender, non-distended, normoactive bowel sounds, no HSM Extremities: warm and well perfused, no clubbing, cyanosis, or edema Skin: No rashes or lesions Neuro: Non-focal Breasts: Right mastectomy site without nodularity or sign of recurrence.  Left breast mass palpable along with dimpling of skin and inversion of the nipple--much softer.  Right chest wall port now closed ECOG PERFORMANCE STATUS: 1 - Symptomatic but completely ambulatory  LABORATORY DATA: Lab Results  Component Value Date   WBC 6.3 02/22/2013   HGB 8.1* 02/22/2013   HCT 25.2* 02/22/2013   MCV 96.9 02/22/2013   PLT  198 02/22/2013      Chemistry      Component Value Date/Time   NA 142 02/22/2013 1316   NA 144 10/28/2012 1248   K 2.7 Repeated and Verified* 02/22/2013 1316   K 3.9 10/28/2012 1248   CL 101 01/25/2013 0952   CL 108 10/28/2012 1248   CO2 28 02/22/2013 1316   CO2 29 09/13/2011 1910   BUN 10.0 02/22/2013 1316   BUN 10 10/28/2012 1248   CREATININE 0.8 02/22/2013 1316   CREATININE 0.80 10/28/2012 1248      Component Value Date/Time   CALCIUM 8.6 02/22/2013 1316   CALCIUM 9.4 09/13/2011 1910   ALKPHOS 79 02/22/2013 1316   ALKPHOS 78 09/13/2011 1910   AST 33 02/22/2013 1316   AST 18 09/13/2011 1910   ALT 29 02/22/2013 1316   ALT 12 09/13/2011 1910   BILITOT 0.29 02/22/2013 1316   BILITOT 0.3 09/13/2011 1910       RADIOGRAPHIC STUDIES:  Ct Chest W Contrast  10/27/2012  *RADIOLOGY REPORT*  Clinical Data:  High risk breast cancer staging.  Right breast cancer diagnosed 1988.  Left breast cancer diagnosed February 2014.  CT CHEST, ABDOMEN AND PELVIS WITH CONTRAST  Technique:  Multidetector CT imaging of the chest, abdomen and pelvis was performed following the standard protocol during bolus administration of intravenous contrast.  Contrast: OMNIPAQUE IOHEXOL 300 MG/ML  SOLN  Comparison:  PET CT scan 03/26 1014  CT CHEST  Findings:  Right breast mastectomy anatomy.  There is thickening of the skin of the left breast to 11 mm.  There is a mass within the deep posterior left breast measuring 29 x 30 mm (image 6).  There are enlarged round abnormal lymph nodes in the left axilla measuring up to 20 mm short axis (image 22).  Lymph nodes extend beneath the sub pectoralis muscles on the left (image 12).  There is no evidence of internal mammary lymphadenopathy.  No infraclavicular adenopathy.  No mediastinal adenopathy.  No pericardial fluid.  No suspicious pulmonary nodules.  IMPRESSION:  1.  Left breast mass consistent with breast carcinoma. 2.  Skin thickening over the left breast is concerning for inflammatory  breast cancer. 3.  Nodal metastasis in the left axilla and sub pectoralis nodal stations. 4.  No evidence of central nodal metastasis or mediastinal metastasis  CT ABDOMEN AND PELVIS  Findings:  No focal hepatic lesion.  The gallbladder, pancreas, spleen, adrenal gland is, and kidneys are normal.  The stomach, small bowel, appendix, cecum are normal.  The colon and rectosigmoid colon are normal.  Abdominal aorta is normal caliber.  No retroperitoneal or periportal lymphadenopathy.  No mesenteric or peritoneal disease.  No free fluid in  the pelvis.  Post hysterectomy anatomy.  No pelvic lymphadenopathy. Review of  bone windows demonstrates no aggressive osseous lesions.  IMPRESSION:  No evidence of metastasis in the abdomen or pelvis.   Original Report Authenticated By: Genevive Bi, M.D.    Ct Abdomen Pelvis W Contrast  10/27/2012  *RADIOLOGY REPORT*  Clinical Data:  High risk breast cancer staging.  Right breast cancer diagnosed 1988.  Left breast cancer diagnosed February 2014.  CT CHEST, ABDOMEN AND PELVIS WITH CONTRAST  Technique:  Multidetector CT imaging of the chest, abdomen and pelvis was performed following the standard protocol during bolus administration of intravenous contrast.  Contrast: OMNIPAQUE IOHEXOL 300 MG/ML  SOLN  Comparison:  PET CT scan 03/26 1014  CT CHEST  Findings:  Right breast mastectomy anatomy.  There is thickening of the skin of the left breast to 11 mm.  There is a mass within the deep posterior left breast measuring 29 x 30 mm (image 6).  There are enlarged round abnormal lymph nodes in the left axilla measuring up to 20 mm short axis (image 22).  Lymph nodes extend beneath the sub pectoralis muscles on the left (image 12).  There is no evidence of internal mammary lymphadenopathy.  No infraclavicular adenopathy.  No mediastinal adenopathy.  No pericardial fluid.  No suspicious pulmonary nodules.  IMPRESSION:  1.  Left breast mass consistent with breast carcinoma. 2.   Skin thickening over the left breast is concerning for inflammatory breast cancer. 3.  Nodal metastasis in the left axilla and sub pectoralis nodal stations. 4.  No evidence of central nodal metastasis or mediastinal metastasis  CT ABDOMEN AND PELVIS  Findings:  No focal hepatic lesion.  The gallbladder, pancreas, spleen, adrenal gland is, and kidneys are normal.  The stomach, small bowel, appendix, cecum are normal.  The colon and rectosigmoid colon are normal.  Abdominal aorta is normal caliber.  No retroperitoneal or periportal lymphadenopathy.  No mesenteric or peritoneal disease.  No free fluid in  the pelvis.  Post hysterectomy anatomy.  No pelvic lymphadenopathy. Review of  bone windows demonstrates no aggressive osseous lesions.  IMPRESSION:  No evidence of metastasis in the abdomen or pelvis.   Original Report Authenticated By: Genevive Bi, M.D.    Mr Breast Bilateral W Wo Contrast  10/12/2012  *RADIOLOGY REPORT*  Clinical Data: History of previous malignant mastectomy of the right breast 25 years ago.  The patient developed diffuse skin thickening and firmness within the left breast.  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  BUN and creatinine were obtained on site at [facility] at [address] Results:  BUN 7 mg/dL,  Creatinine 1.0 mg/dL.  BILATERAL BREAST MRI WITH AND WITHOUT CONTRAST  Technique: Multiplanar, multisequence MR images of both breasts were obtained prior to and following the intravenous administration of 20ml of Multihance.  Three dimensional images were evaluated at the independent DynaCad workstation.  Comparison:  Mammograms dated 09/30/2012 and 09/28/2012.  Findings: There has been a previous right mastectomy.  There is marked diffuse enhancement involving the  majority of the left breast.  This measures 13.0 x 12.5 x 9.5 cm in size.  This  is associated with a mixture of plateau and washout enhancement kinetics.  In addition,  there is diffuse skin thickening and dermal enhancement.  There are multiple enlarged level I left axillary lymph nodes.  The largest lymph node measures 2.4 cm in size.  The large area of enhancement within the left breast extends posteriorly to abut the anterior portion of the pectoral muscle. There is no evidence for internal mammary adenopathy or right axillary adenopathy.  There are no additional findings.  IMPRESSION:  1.  Large ( 13 cm) area of enhancement involving the majority of the left breast consistent with the patient's known invasive ductal carcinoma and DCIS. This extends posteriorly to abut the pectoralis muscle. 2.  Multiple enlarged level I left axillary lymph nodes. 3.  Previous right mastectomy.  RECOMMENDATION: Treatment plan  THREE-DIMENSIONAL MR IMAGE RENDERING ON INDEPENDENT WORKSTATION:  Three-dimensional MR images were rendered by post-processing of the original MR data on an independent workstation.  The three- dimensional MR images were interpreted, and findings were reported in the accompanying complete MRI report for this study.  BI-RADS CATEGORY 6:  Known biopsy-proven malignancy - appropriate action should be taken.   Original Report Authenticated By: Rolla Plate, M.D.    Nm Pet Image Initial (pi) Skull Base To Thigh  10/27/2012  *RADIOLOGY REPORT*  Clinical Data: Initial treatment strategy for high risk of breast cancer. Triple negative  NUCLEAR MEDICINE PET SKULL BASE TO THIGH  Fasting Blood Glucose:  97  Technique:  17.9 mCi F-18 FDG was injected intravenously. CT data was obtained and used for attenuation correction and anatomic localization only.  (This was not acquired as a diagnostic CT examination.) Additional exam technical data entered on technologist worksheet.  Comparison:  Findings:  Neck: No hypermetabolic lymph nodes in the neck.  Chest:  There is intense metabolic activity  ( SUV max = 13.7) associated with a large portion of the glandular tissue of the left breast consistent with carcinoma.  There are intensely hypermetabolic left axillary lymph nodes with SUV max = 15.1.  These nodes are rounded and measure up to 2 cm. Hypermetabolic nodes extend to the sub pectoralis nodal station (image 71).  There is a single hypermetabolic internal mammary lymph node measuring 5 mm just left of the sternum (image 87).  This has intense metabolic activity for size with SUV max = 7.1.  No hypermetabolic mediastinal lymph nodes.  No hypermetabolic pulmonary nodules.  Abdomen/Pelvis:  No abnormal hypermetabolic activity within the liver, pancreas, adrenal glands, or spleen.  No hypermetabolic lymph nodes in the abdomen or pelvis.  Skeleton:  No focal hypermetabolic activity to suggest skeletal metastasis.  IMPRESSION:  1.  Hypermetabolic breast tissue on the left consistent with primary carcinoma. 2.  Hypermetabolic nodal metastasis in the left axilla extending to the sub pectoralis nodes. 3.  Single hypermetabolic left internal mammary lymph node.  4.  No evidence of pulmonary metastasis.  5.  No evidence of metastasis beneath hemidiaphragms or within the skeleton.   Original Report Authenticated By: Genevive Bi, M.D.    Dg Chest Port 1 View  10/28/2012  *RADIOLOGY REPORT*  Clinical Data: Port-A-Cath placement.  PORTABLE CHEST - 1 VIEW  Comparison: None.  Findings: The patient is rotated on the study.  Right side Port-A- Cath is in place.  Tubing appears intact.  Tip of the catheter projects in the mid to lower  superior vena cava.  No pneumothorax identified.  Heart size is normal.  Lungs are clear.  The patient is status post right mastectomy and axillary dissection.  IMPRESSION: Tip of Port-A-Cath projects over the mid to lower superior vena cava.  The patient is rotated on the study.  Exact position of the tip of the catheter could be determined with the lateral film. No pneumothorax.    Original Report Authenticated By: Holley Dexter, M.D.    Dg Fluoro Guide Cv Line-no Report  10/28/2012  CLINICAL DATA: [facility]   FLOURO GUIDE CV LINE  Fluoroscopy was utilized by the requesting physician.  No radiographic  interpretation.      ASSESSMENT:   57 year old female with  #1 history of breast cancer of the right breast status post mastectomy when she was 57 years old. Due to this she was referred for genetic testing.   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 Patient's tumor is ER negative PR negative HER-2/neu negative with a Ki-67 of 21%. Patient is being seen in medical oncology for neoadjuvant chemotherapy. Patient and I discussed her pathology in detail. We discussed treatment for breast cancer including surgical and with chemotherapy. Patient understands that she may still need a mastectomy however by giving her neoadjuvant chemotherapy her surgery may be made a little bit easier hopefully with giving her negative margins. She understands that neoadjuvant chemotherapy will also treat her distant disease.I discussed type of chemotherapy that she should have. This would include dose dense FEC x6 cycles followed by weekly Taxol carboplatinum for 12 weeks.  #3 Patient underwent staging studies with PET/CT, and received echo, port placement and chemotherapy class.    #4 Open port wound, healed  #5 Hypokalemia  PLAN:   1. Doing well.  She will proceed with chemotherapy.  She is anemic.  I again gave her multiple handouts about anemia, symptoms of anemia, risks of a blood transfusion, and what a blood transfusion entails.  She will call me immediately if she becomes more fatigued, or develops any symptoms we discussed, for now, she will receive a blood transfusion on Thursday.  She has read the risks/benefits in detail.      2. Ms. Talsma will return next week for labs, an appt  and cycle 4 of Taxol/Carbo and possible blood transfusion.      3. She will take Kdur po bid.  We will re-check her potassium on Thursday.    All questions were answered. The patient knows to call the clinic with any problems, questions or concerns. We can certainly see the patient much sooner if necessary.  I spent 25 minutes counseling the patient face to face. The total time spent in the appointment was 30 minutes.  Cherie Ouch Lyn Hollingshead, NP Medical Oncology [facility] Phone: [phone] 02/22/2013, 4:14 PM

## 2013-02-24 ENCOUNTER — Ambulatory Visit (HOSPITAL_BASED_OUTPATIENT_CLINIC_OR_DEPARTMENT_OTHER): Payer: BC Managed Care – PPO | Admitting: Lab

## 2013-02-24 ENCOUNTER — Ambulatory Visit (HOSPITAL_BASED_OUTPATIENT_CLINIC_OR_DEPARTMENT_OTHER): Payer: BC Managed Care – PPO

## 2013-02-24 VITALS — BP 129/71 | HR 66 | Temp 97.8°F | Resp 20

## 2013-02-24 DIAGNOSIS — D6481 Anemia due to antineoplastic chemotherapy: Secondary | ICD-10-CM

## 2013-02-24 DIAGNOSIS — E876 Hypokalemia: Secondary | ICD-10-CM

## 2013-02-24 LAB — BASIC METABOLIC PANEL (CC13)
CO2: 23 mEq/L (ref 22–29)
Calcium: 8.6 mg/dL (ref 8.4–10.4)
Glucose: 358 mg/dl — ABNORMAL HIGH (ref 70–140)
Sodium: 140 mEq/L (ref 136–145)

## 2013-02-24 MED ORDER — ACETAMINOPHEN 325 MG PO TABS
650.0000 mg | ORAL_TABLET | Freq: Once | ORAL | Status: AC
  Administered 2013-02-24: 650 mg via ORAL

## 2013-02-24 MED ORDER — SODIUM CHLORIDE 0.9 % IV SOLN
250.0000 mL | Freq: Once | INTRAVENOUS | Status: AC
  Administered 2013-02-24: 250 mL via INTRAVENOUS

## 2013-02-24 MED ORDER — HEPARIN SOD (PORK) LOCK FLUSH 100 UNIT/ML IV SOLN
500.0000 [IU] | Freq: Every day | INTRAVENOUS | Status: AC | PRN
  Administered 2013-02-24: 500 [IU]
  Filled 2013-02-24: qty 5

## 2013-02-24 MED ORDER — DIPHENHYDRAMINE HCL 25 MG PO CAPS
25.0000 mg | ORAL_CAPSULE | Freq: Once | ORAL | Status: AC
  Administered 2013-02-24: 25 mg via ORAL

## 2013-02-24 MED ORDER — SODIUM CHLORIDE 0.9 % IJ SOLN
10.0000 mL | INTRAMUSCULAR | Status: AC | PRN
  Administered 2013-02-24: 10 mL
  Filled 2013-02-24: qty 10

## 2013-02-24 NOTE — Patient Instructions (Addendum)
Blood Transfusion  A blood transfusion replaces your blood or some of its parts. Blood is replaced when you have lost blood because of surgery, an accident, or for severe blood conditions like anemia. You can donate blood to be used on yourself if you have a planned surgery. If you lose blood during that surgery, your own blood can be given back to you. Any blood given to you is checked to make sure it matches your blood type. Your temperature, blood pressure, and heart rate (vital signs) will be checked often.  GET HELP RIGHT AWAY IF:   You feel sick to your stomach (nauseous) or throw up (vomit).  You have watery poop (diarrhea).  You have shortness of breath or trouble breathing.  You have blood in your pee (urine) or have dark colored pee.  You have chest pain or tightness.  Your eyes or skin turn yellow (jaundice).  You have a temperature by mouth above 102 F (38.9 C), not controlled by medicine.  You start to shake and have chills.  You develop a a red rash (hives) or feel itchy.  You develop lightheadedness or feel confused.  You develop back, joint, or muscle pain.  You do not feel hungry (lost appetite).  You feel tired, restless, or nervous.  You develop belly (abdominal) cramps. Document Released: 10/17/2008 Document Revised: 10/13/2011 Document Reviewed: 10/17/2008 [employer] Patient Information 2014 [employer].  

## 2013-02-25 ENCOUNTER — Telehealth: Payer: Self-pay

## 2013-02-25 LAB — TYPE AND SCREEN
ABO/RH(D): O NEG
Unit division: 0

## 2013-02-25 NOTE — Telephone Encounter (Signed)
Spoke with pt regarding lab results for 7/24. Per LA, pt advised to reduce her K dosage from 40 mEq BID to 40 mEq daily. Pt voiced understanding and knows to call the office with any questions. TMB

## 2013-03-01 ENCOUNTER — Encounter: Payer: Self-pay | Admitting: Oncology

## 2013-03-01 ENCOUNTER — Ambulatory Visit (HOSPITAL_BASED_OUTPATIENT_CLINIC_OR_DEPARTMENT_OTHER): Payer: BC Managed Care – PPO

## 2013-03-01 ENCOUNTER — Ambulatory Visit (HOSPITAL_BASED_OUTPATIENT_CLINIC_OR_DEPARTMENT_OTHER): Payer: BC Managed Care – PPO | Admitting: Adult Health

## 2013-03-01 ENCOUNTER — Encounter: Payer: Self-pay | Admitting: Adult Health

## 2013-03-01 ENCOUNTER — Other Ambulatory Visit (HOSPITAL_BASED_OUTPATIENT_CLINIC_OR_DEPARTMENT_OTHER): Payer: BC Managed Care – PPO | Admitting: Lab

## 2013-03-01 VITALS — BP 172/98 | HR 93 | Temp 98.8°F | Resp 20 | Ht 64.0 in | Wt 212.9 lb

## 2013-03-01 DIAGNOSIS — Z171 Estrogen receptor negative status [ER-]: Secondary | ICD-10-CM

## 2013-03-01 DIAGNOSIS — Z5111 Encounter for antineoplastic chemotherapy: Secondary | ICD-10-CM

## 2013-03-01 DIAGNOSIS — C50919 Malignant neoplasm of unspecified site of unspecified female breast: Secondary | ICD-10-CM

## 2013-03-01 DIAGNOSIS — C50912 Malignant neoplasm of unspecified site of left female breast: Secondary | ICD-10-CM

## 2013-03-01 DIAGNOSIS — C773 Secondary and unspecified malignant neoplasm of axilla and upper limb lymph nodes: Secondary | ICD-10-CM

## 2013-03-01 DIAGNOSIS — K1230 Oral mucositis (ulcerative), unspecified: Secondary | ICD-10-CM

## 2013-03-01 DIAGNOSIS — B373 Candidiasis of vulva and vagina: Secondary | ICD-10-CM

## 2013-03-01 DIAGNOSIS — E876 Hypokalemia: Secondary | ICD-10-CM

## 2013-03-01 LAB — CBC WITH DIFFERENTIAL/PLATELET
BASO%: 0.2 % (ref 0.0–2.0)
EOS%: 0.4 % (ref 0.0–7.0)
HCT: 34.7 % — ABNORMAL LOW (ref 34.8–46.6)
LYMPH%: 17 % (ref 14.0–49.7)
MCH: 31 pg (ref 25.1–34.0)
MCHC: 32.9 g/dL (ref 31.5–36.0)
MONO%: 8.1 % (ref 0.0–14.0)
NEUT%: 74.3 % (ref 38.4–76.8)
lymph#: 1 10*3/uL (ref 0.9–3.3)

## 2013-03-01 LAB — COMPREHENSIVE METABOLIC PANEL (CC13)
ALT: 27 U/L (ref 0–55)
AST: 23 U/L (ref 5–34)
Alkaline Phosphatase: 71 U/L (ref 40–150)
Chloride: 104 mEq/L (ref 98–109)
Creatinine: 0.7 mg/dL (ref 0.6–1.1)
Total Bilirubin: 0.37 mg/dL (ref 0.20–1.20)

## 2013-03-01 MED ORDER — NYSTATIN 100000 UNIT/ML MT SUSP: 500000.0000 [IU] | Freq: Four times a day (QID) | OROMUCOSAL | Status: DC

## 2013-03-01 MED ORDER — HEPARIN SOD (PORK) LOCK FLUSH 100 UNIT/ML IV SOLN
500.0000 [IU] | Freq: Once | INTRAVENOUS | Status: AC | PRN
  Administered 2013-03-01: 500 [IU]
  Filled 2013-03-01: qty 5

## 2013-03-01 MED ORDER — SODIUM CHLORIDE 0.9 % IV SOLN
283.8000 mg | Freq: Once | INTRAVENOUS | Status: AC
  Administered 2013-03-01: 280 mg via INTRAVENOUS
  Filled 2013-03-01: qty 28

## 2013-03-01 MED ORDER — FLUCONAZOLE 200 MG PO TABS: 200.0000 mg | ORAL_TABLET | ORAL | Status: DC

## 2013-03-01 MED ORDER — SODIUM CHLORIDE 0.9 % IV SOLN
Freq: Once | INTRAVENOUS | Status: AC
  Administered 2013-03-01: 15:00:00 via INTRAVENOUS

## 2013-03-01 MED ORDER — DEXAMETHASONE SODIUM PHOSPHATE 20 MG/5ML IJ SOLN
20.0000 mg | Freq: Once | INTRAMUSCULAR | Status: AC
  Administered 2013-03-01: 20 mg via INTRAVENOUS

## 2013-03-01 MED ORDER — ONDANSETRON 16 MG/50ML IVPB (CHCC)
16.0000 mg | Freq: Once | INTRAVENOUS | Status: AC
  Administered 2013-03-01: 16 mg via INTRAVENOUS

## 2013-03-01 MED ORDER — DIPHENHYDRAMINE HCL 50 MG/ML IJ SOLN
50.0000 mg | Freq: Once | INTRAMUSCULAR | Status: AC
  Administered 2013-03-01: 50 mg via INTRAVENOUS

## 2013-03-01 MED ORDER — SODIUM CHLORIDE 0.9 % IJ SOLN
10.0000 mL | INTRAMUSCULAR | Status: DC | PRN
  Administered 2013-03-01: 10 mL
  Filled 2013-03-01: qty 10

## 2013-03-01 MED ORDER — FAMOTIDINE IN NACL 20-0.9 MG/50ML-% IV SOLN
20.0000 mg | Freq: Once | INTRAVENOUS | Status: AC
  Administered 2013-03-01: 20 mg via INTRAVENOUS

## 2013-03-01 MED ORDER — PACLITAXEL CHEMO INJECTION 300 MG/50ML
80.0000 mg/m2 | Freq: Once | INTRAVENOUS | Status: AC
  Administered 2013-03-01: 162 mg via INTRAVENOUS
  Filled 2013-03-01: qty 27

## 2013-03-01 NOTE — Progress Notes (Signed)
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   PRIOR THERAPY:  1. Patient has history of right breast cancer patient underwent a mastectomy when she was 57 years old. This occurred during her pregnancy. After that patient did not take any kind of further treatments. She continued to do well.   2.Recently she noted changes in her nipple had become itchy and retracted. Because of this she underwent mammogram with ultrasound that showed 2 areas in the left breast one at 12:00 position and a second at 3:00 position. She also had an ultrasound performed that showed the diameter to be at least 6 cm and multiple left axillary lymph nodes that were abnormal. She then went on to have a biopsy performed on 09/30/2012. 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 Patient went on to have MRIs of the breasts performed and was noted to be marked diffuse enhancement involving majority of the left breast measuring 13.0 x 12.5 x 9.5 cm. There was diffuse skin thickening and dermal enhancement. Multiple enlarged level I left axillary lymph nodes. The largest lymph node measured 2.4 cm in size. The large area of enhancement within the left breast extends posteriorly to abut the anterior portion of the pectoralis muscle. There was no evidence of internal mammary adenopathy or right axillary adenopathy.  PET/CT did show the left breast mass, and left axillary and subpectoralis nodal metastases, but no further areas throughout the chest/abd/pelvis of metastases.   3.  Patient currently undergoing  neoadjuvant chemotherapy with FEC x 6 cycles starting on 11/02/12, which will be followed by 12 cycles of weekly Taxol/Carbo that started on 02/08/13.  CURRENT THERAPY: Taxol Carbo week 4  INTERVAL HISTORY: [name] 57 y.o. female returns for follow up today prior to her fourth cycle of Taxol/Carbo.  She is doing well today.  She feels much improved since receiving the blood transfusion on Thursday.  She denies fevers, chills, nausea, vomiting, constipation.  Her numbness is stable.  Otherwise, a 10 point ROS is neg.    MEDICAL HISTORY: Past Medical History  Diagnosis Date  . ALLERGIC RHINITIS   . GERD (gastroesophageal reflux disease)   . Hypertension   . Hypothyroidism   . Hx of colonic polyps   . Wears glasses   . Breast cancer 26, age 84    right  . Breast cancer 09/30/12    left, ER/PR -, Her 2 -    ALLERGIES:  is allergic to sulfa antibiotics and sulfonamide derivatives.  MEDICATIONS:  Current Outpatient Prescriptions  Medication Sig Dispense Refill  . dexamethasone (DECADRON) 4 MG tablet TAKE 2 TABS PO QD ON DAY AFTER CHEMO THEN 2 TABS BID X 2 DAYS  30 tablet  1  . diltiazem (CARDIZEM CD) 240 MG 24 hr capsule Take 1 capsule (240 mg total) by mouth daily.  90 capsule  2  . diltiazem (CARDIZEM CD) 240 MG 24 hr capsule TAKE ONE CAPSULE BY MOUTH EVERY DAY  90 capsule  3  . doxycycline (VIBRA-TABS) 100 MG tablet Take 1 tablet (100 mg total) by mouth 2 (two) times daily.  28 tablet  2  . fluconazole (DIFLUCAN) 200 MG tablet Take 1 tablet by mouth every other day.      . gabapentin (NEURONTIN) 100 MG capsule Take 1 capsule (100 mg total) by mouth 3 (three) times daily.  90 capsule  2  . hydrochlorothiazide (MICROZIDE) 12.5 MG capsule TAKE 1 CAPSULE (12.5 MG TOTAL) BY MOUTH EVERY MORNING.  90 capsule  2  . latanoprost (XALATAN) 0.005 % ophthalmic solution Place 1 drop into both eyes at bedtime.       Marland Kitchen levothyroxine (SYNTHROID, LEVOTHROID) 50 MCG tablet TAKE 1 TABLET (50 MCG  TOTAL) BY MOUTH DAILY.  90 tablet  2  . lidocaine-prilocaine (EMLA) cream Apply topically as needed.  30 g  6  . LORazepam (ATIVAN) 0.5 MG tablet Take 1 tablet (0.5 mg total) by mouth every 6 (six) hours as needed (Nausea or vomiting).  30 tablet  0  . NEXIUM 40 MG capsule TAKE 1 CAPSULE (40 MG TOTAL) BY MOUTH DAILY BEFORE BREAKFAST.  90 capsule  1  . nystatin (MYCOSTATIN) 100000 UNIT/ML suspension TAKE 5 MLS BY MOUTH 4 (FOUR) TIMES DAILY.  180 mL  0  . PATADAY 0.2 % SOLN       . potassium chloride SA (K-DUR,KLOR-CON) 20 MEQ tablet Take 2 tablets (40 mEq total) by mouth 2 (two) times daily.  120 tablet  0   No current facility-administered medications for this visit.    SURGICAL HISTORY:  Past Surgical History  Procedure Laterality Date  . Tonsillectomy    . Caesarean section      x 1  . Mastectomy  04/1987    right side  . Abdominal hysterectomy  06/1988    fibroids  . Portacath placement Right 10/28/2012    Procedure: PORT PLACEMENT;  Surgeon: Clovis Pu. Cornett, MD;  Location: [facility];  Service: General;  Laterality: Right;  Right Subclavian Vein    REVIEW OF SYSTEMS:  General: fatigue (+), night sweats (-), fever (-), pain (-) Lymph: palpable nodes (-) HEENT: vision changes (-), mucositis (-), gum bleeding (-), epistaxis (-) Cardiovascular: chest pain (-), palpitations (-) Pulmonary: shortness of breath (-), dyspnea on exertion (-), cough (-), hemoptysis (-) GI:  Early satiety (-), melena (-), dysphagia (-), nausea/vomiting (-), diarrhea (-) GU: dysuria (-), hematuria (-), incontinence (-) Musculoskeletal: joint swelling (-), joint pain (-), back pain (-) Neuro: weakness (-), numbness (-), headache (-), confusion (-) Skin: Rash (-), lesions (-), dryness (-) Psych: depression (-), suicidal/homicidal ideation (-), feeling of hopelessness (-)   PHYSICAL EXAMINATION: Blood pressure 172/98, pulse 93, temperature 98.8 F (37.1 C), temperature source Oral, resp.  rate 20, height 5\' 4"  (1.626 m), weight 212 lb 14.4 oz (96.571 kg). Body mass index is 36.53 kg/(m^2). General: Patient is a well appearing female in no acute distress HEENT: PERRLA, sclerae anicteric no conjunctival pallor, MMM. Ulcer on left lateral tongue and right buccal mucosa Neck: supple, no palpable adenopathy Lungs: clear to auscultation bilaterally, no wheezes, rhonchi, or rales Cardiovascular: regular rate rhythm, S1, S2, no murmurs, rubs or gallops Abdomen: Soft, non-tender, non-distended, normoactive bowel sounds, no HSM Extremities: warm and well perfused, no clubbing, cyanosis, or edema Skin: No rashes or lesions Neuro: Non-focal Breasts: Right mastectomy site without nodularity or sign of recurrence.  Left breast mass palpable along with dimpling of skin and inversion of the nipple--much softer.  Right chest wall port now closed ECOG PERFORMANCE STATUS: 1 - Symptomatic but completely ambulatory  LABORATORY DATA: Lab  Results  Component Value Date   WBC 5.7 03/01/2013   HGB 11.4* 03/01/2013   HCT 34.7* 03/01/2013   MCV 94.3 03/01/2013   PLT 174 03/01/2013      Chemistry      Component Value Date/Time   NA 140 02/24/2013 1332   NA 144 10/28/2012 1248   K 4.4 02/24/2013 1332   K 3.9 10/28/2012 1248   CL 101 01/25/2013 0952   CL 108 10/28/2012 1248   CO2 23 02/24/2013 1332   CO2 29 09/13/2011 1910   BUN 11.4 02/24/2013 1332   BUN 10 10/28/2012 1248   CREATININE 0.8 02/24/2013 1332   CREATININE 0.80 10/28/2012 1248      Component Value Date/Time   CALCIUM 8.6 02/24/2013 1332   CALCIUM 9.4 09/13/2011 1910   ALKPHOS 79 02/22/2013 1316   ALKPHOS 78 09/13/2011 1910   AST 33 02/22/2013 1316   AST 18 09/13/2011 1910   ALT 29 02/22/2013 1316   ALT 12 09/13/2011 1910   BILITOT 0.29 02/22/2013 1316   BILITOT 0.3 09/13/2011 1910       RADIOGRAPHIC STUDIES:  Ct Chest W Contrast  10/27/2012  *RADIOLOGY REPORT*  Clinical Data:  High risk breast cancer staging.  Right breast cancer diagnosed  1988.  Left breast cancer diagnosed February 2014.  CT CHEST, ABDOMEN AND PELVIS WITH CONTRAST  Technique:  Multidetector CT imaging of the chest, abdomen and pelvis was performed following the standard protocol during bolus administration of intravenous contrast.  Contrast: OMNIPAQUE IOHEXOL 300 MG/ML  SOLN  Comparison:  PET CT scan 03/26 1014  CT CHEST  Findings:  Right breast mastectomy anatomy.  There is thickening of the skin of the left breast to 11 mm.  There is a mass within the deep posterior left breast measuring 29 x 30 mm (image 6).  There are enlarged round abnormal lymph nodes in the left axilla measuring up to 20 mm short axis (image 22).  Lymph nodes extend beneath the sub pectoralis muscles on the left (image 12).  There is no evidence of internal mammary lymphadenopathy.  No infraclavicular adenopathy.  No mediastinal adenopathy.  No pericardial fluid.  No suspicious pulmonary nodules.  IMPRESSION:  1.  Left breast mass consistent with breast carcinoma. 2.  Skin thickening over the left breast is concerning for inflammatory breast cancer. 3.  Nodal metastasis in the left axilla and sub pectoralis nodal stations. 4.  No evidence of central nodal metastasis or mediastinal metastasis  CT ABDOMEN AND PELVIS  Findings:  No focal hepatic lesion.  The gallbladder, pancreas, spleen, adrenal gland is, and kidneys are normal.  The stomach, small bowel, appendix, cecum are normal.  The colon and rectosigmoid colon are normal.  Abdominal aorta is normal caliber.  No retroperitoneal or periportal lymphadenopathy.  No mesenteric or peritoneal disease.  No free fluid in  the pelvis.  Post hysterectomy anatomy.  No pelvic lymphadenopathy. Review of  bone windows demonstrates no aggressive osseous lesions.  IMPRESSION:  No evidence of metastasis in the abdomen or pelvis.   Original Report Authenticated By: Genevive Bi, M.D.    Ct Abdomen Pelvis W Contrast  10/27/2012  *RADIOLOGY REPORT*  Clinical Data:   High risk breast cancer staging.  Right breast cancer diagnosed 1988.  Left breast cancer diagnosed February 2014.  CT CHEST, ABDOMEN AND PELVIS WITH CONTRAST  Technique:  Multidetector CT imaging of the chest, abdomen and pelvis was performed following the standard protocol during bolus administration of intravenous  contrast.  Contrast: OMNIPAQUE IOHEXOL 300 MG/ML  SOLN  Comparison:  PET CT scan 03/26 1014  CT CHEST  Findings:  Right breast mastectomy anatomy.  There is thickening of the skin of the left breast to 11 mm.  There is a mass within the deep posterior left breast measuring 29 x 30 mm (image 6).  There are enlarged round abnormal lymph nodes in the left axilla measuring up to 20 mm short axis (image 22).  Lymph nodes extend beneath the sub pectoralis muscles on the left (image 12).  There is no evidence of internal mammary lymphadenopathy.  No infraclavicular adenopathy.  No mediastinal adenopathy.  No pericardial fluid.  No suspicious pulmonary nodules.  IMPRESSION:  1.  Left breast mass consistent with breast carcinoma. 2.  Skin thickening over the left breast is concerning for inflammatory breast cancer. 3.  Nodal metastasis in the left axilla and sub pectoralis nodal stations. 4.  No evidence of central nodal metastasis or mediastinal metastasis  CT ABDOMEN AND PELVIS  Findings:  No focal hepatic lesion.  The gallbladder, pancreas, spleen, adrenal gland is, and kidneys are normal.  The stomach, small bowel, appendix, cecum are normal.  The colon and rectosigmoid colon are normal.  Abdominal aorta is normal caliber.  No retroperitoneal or periportal lymphadenopathy.  No mesenteric or peritoneal disease.  No free fluid in  the pelvis.  Post hysterectomy anatomy.  No pelvic lymphadenopathy. Review of  bone windows demonstrates no aggressive osseous lesions.  IMPRESSION:  No evidence of metastasis in the abdomen or pelvis.   Original Report Authenticated By: Genevive Bi, M.D.    Mr Breast  Bilateral W Wo Contrast  10/12/2012  *RADIOLOGY REPORT*  Clinical Data: History of previous malignant mastectomy of the right breast 25 years ago.  The patient developed diffuse skin thickening and firmness within the left breast.  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  BUN and creatinine were obtained on site at [facility] at [address] Results:  BUN 7 mg/dL,  Creatinine 1.0 mg/dL.  BILATERAL BREAST MRI WITH AND WITHOUT CONTRAST  Technique: Multiplanar, multisequence MR images of both breasts were obtained prior to and following the intravenous administration of 20ml of Multihance.  Three dimensional images were evaluated at the independent DynaCad workstation.  Comparison:  Mammograms dated 09/30/2012 and 09/28/2012.  Findings: There has been a previous right mastectomy.  There is marked diffuse enhancement involving the majority of the left breast.  This measures 13.0 x 12.5 x 9.5 cm in size.  This is associated with a mixture of plateau and washout enhancement kinetics.  In addition,  there is diffuse skin thickening and dermal enhancement.  There are multiple enlarged level I left axillary lymph nodes.  The largest lymph node measures 2.4 cm in size.  The large area of enhancement within the left breast extends posteriorly to abut the anterior portion of the pectoral muscle. There is no evidence for internal mammary adenopathy or right axillary adenopathy.  There are no additional findings.  IMPRESSION:  1.  Large ( 13 cm) area of enhancement involving the majority of the left breast consistent with the patient's known invasive ductal carcinoma and DCIS. This extends posteriorly to abut the pectoralis muscle. 2.  Multiple enlarged level I left axillary lymph nodes. 3.  Previous right mastectomy.  RECOMMENDATION: Treatment plan  THREE-DIMENSIONAL MR IMAGE RENDERING  ON  INDEPENDENT WORKSTATION:  Three-dimensional MR images were rendered by post-processing of the original MR data on an independent workstation.  The three- dimensional MR images were interpreted, and findings were reported in the accompanying complete MRI report for this study.  BI-RADS CATEGORY 6:  Known biopsy-proven malignancy - appropriate action should be taken.   Original Report Authenticated By: Rolla Plate, M.D.    Nm Pet Image Initial (pi) Skull Base To Thigh  10/27/2012  *RADIOLOGY REPORT*  Clinical Data: Initial treatment strategy for high risk of breast cancer. Triple negative  NUCLEAR MEDICINE PET SKULL BASE TO THIGH  Fasting Blood Glucose:  97  Technique:  17.9 mCi F-18 FDG was injected intravenously. CT data was obtained and used for attenuation correction and anatomic localization only.  (This was not acquired as a diagnostic CT examination.) Additional exam technical data entered on technologist worksheet.  Comparison:  Findings:  Neck: No hypermetabolic lymph nodes in the neck.  Chest:  There is intense metabolic activity ( SUV max = 16.1) associated with a large portion of the glandular tissue of the left breast consistent with carcinoma.  There are intensely hypermetabolic left axillary lymph nodes with SUV max = 15.1.  These nodes are rounded and measure up to 2 cm. Hypermetabolic nodes extend to the sub pectoralis nodal station (image 71).  There is a single hypermetabolic internal mammary lymph node measuring 5 mm just left of the sternum (image 87).  This has intense metabolic activity for size with SUV max = 7.1.  No hypermetabolic mediastinal lymph nodes.  No hypermetabolic pulmonary nodules.  Abdomen/Pelvis:  No abnormal hypermetabolic activity within the liver, pancreas, adrenal glands, or spleen.  No hypermetabolic lymph nodes in the abdomen or pelvis.  Skeleton:  No focal hypermetabolic activity to suggest skeletal metastasis.  IMPRESSION:  1.  Hypermetabolic breast tissue on  the left consistent with primary carcinoma. 2.  Hypermetabolic nodal metastasis in the left axilla extending to the sub pectoralis nodes. 3.  Single hypermetabolic left internal mammary lymph node.  4.  No evidence of pulmonary metastasis.  5.  No evidence of metastasis beneath hemidiaphragms or within the skeleton.   Original Report Authenticated By: Genevive Bi, M.D.    Dg Chest Port 1 View  10/28/2012  *RADIOLOGY REPORT*  Clinical Data: Port-A-Cath placement.  PORTABLE CHEST - 1 VIEW  Comparison: None.  Findings: The patient is rotated on the study.  Right side Port-A- Cath is in place.  Tubing appears intact.  Tip of the catheter projects in the mid to lower superior vena cava.  No pneumothorax identified.  Heart size is normal.  Lungs are clear.  The patient is status post right mastectomy and axillary dissection.  IMPRESSION: Tip of Port-A-Cath projects over the mid to lower superior vena cava.  The patient is rotated on the study.  Exact position of the tip of the catheter could be determined with the lateral film. No pneumothorax.   Original Report Authenticated By: Holley Dexter, M.D.    Dg Fluoro Guide Cv Line-no Report  10/28/2012  CLINICAL DATA: [facility]   FLOURO GUIDE CV LINE  Fluoroscopy was utilized by the requesting physician.  No radiographic  interpretation.      ASSESSMENT:   57 year old female with  #1 history of breast cancer of the right breast status post mastectomy when she was 57 years old. Due to this she was referred for genetic testing.   #2 patient now with left invasive ductal carcinoma with ductal carcinoma in situ  measuring 13.0 x 12.5 x 9.5 cm on MRI with positive lymph nodes. Patient's tumor is ER negative PR negative HER-2/neu negative with a Ki-67 of 21%. Patient is being seen in medical oncology for neoadjuvant chemotherapy. Patient and I discussed her pathology in detail. We discussed treatment for breast cancer including surgical and with chemotherapy.  Patient understands that she may still need a mastectomy however by giving her neoadjuvant chemotherapy her surgery may be made a little bit easier hopefully with giving her negative margins. She understands that neoadjuvant chemotherapy will also treat her distant disease.I discussed type of chemotherapy that she should have. This would include dose dense FEC x6 cycles followed by weekly Taxol carboplatinum for 12 weeks.  #3 Patient underwent staging studies with PET/CT, and received echo, port placement and chemotherapy class.    #4 Open port wound, healed  #5 Hypokalemia  PLAN:   1. Doing well.  She will proceed with chemotherapy.  .      2. Ms. Demuro will return next week for labs, an appt and cycle 5 of Taxol/Carbo and possible blood transfusion.      3. Her potassium level is pending.  We will contact her with instructions.   All questions were answered. The patient knows to call the clinic with any problems, questions or concerns. We can certainly see the patient much sooner if necessary.  I spent 25 minutes counseling the patient face to face. The total time spent in the appointment was 30 minutes.  Cherie Ouch Lyn Hollingshead, NP Medical Oncology [facility] Phone: [phone] 03/01/2013, 2:11 PM

## 2013-03-01 NOTE — Patient Instructions (Addendum)
[facility] Discharge Instructions for Patients Receiving Chemotherapy  Today you received the following chemotherapy agents :  Taxol, Carboplatin.  To help prevent nausea and vomiting after your treatment, we encourage you to take your nausea medication as instructed by your physician.   If you develop nausea and vomiting that is not controlled by your nausea medication, call the clinic.   BELOW ARE SYMPTOMS THAT SHOULD BE REPORTED IMMEDIATELY:  *FEVER GREATER THAN 100.5 F  *CHILLS WITH OR WITHOUT FEVER  NAUSEA AND VOMITING THAT IS NOT CONTROLLED WITH YOUR NAUSEA MEDICATION  *UNUSUAL SHORTNESS OF BREATH  *UNUSUAL BRUISING OR BLEEDING  TENDERNESS IN MOUTH AND THROAT WITH OR WITHOUT PRESENCE OF ULCERS  *URINARY PROBLEMS  *BOWEL PROBLEMS  UNUSUAL RASH Items with * indicate a potential emergency and should be followed up as soon as possible.  Feel free to call the clinic you have any questions or concerns. The clinic phone number is [phone].    

## 2013-03-01 NOTE — Patient Instructions (Addendum)
Doing well.  Proceed with chemotherapy.  Please call us if you have any questions or concerns.    

## 2013-03-08 ENCOUNTER — Ambulatory Visit (HOSPITAL_BASED_OUTPATIENT_CLINIC_OR_DEPARTMENT_OTHER): Payer: BC Managed Care – PPO | Admitting: Adult Health

## 2013-03-08 ENCOUNTER — Other Ambulatory Visit (HOSPITAL_BASED_OUTPATIENT_CLINIC_OR_DEPARTMENT_OTHER): Payer: BC Managed Care – PPO | Admitting: Lab

## 2013-03-08 ENCOUNTER — Encounter: Payer: Self-pay | Admitting: Oncology

## 2013-03-08 ENCOUNTER — Ambulatory Visit (HOSPITAL_BASED_OUTPATIENT_CLINIC_OR_DEPARTMENT_OTHER): Payer: BC Managed Care – PPO

## 2013-03-08 ENCOUNTER — Encounter: Payer: Self-pay | Admitting: Adult Health

## 2013-03-08 VITALS — BP 151/91 | HR 109 | Temp 98.4°F | Resp 20 | Ht 64.0 in | Wt 207.4 lb

## 2013-03-08 DIAGNOSIS — Z5111 Encounter for antineoplastic chemotherapy: Secondary | ICD-10-CM

## 2013-03-08 DIAGNOSIS — Z853 Personal history of malignant neoplasm of breast: Secondary | ICD-10-CM

## 2013-03-08 DIAGNOSIS — C50919 Malignant neoplasm of unspecified site of unspecified female breast: Secondary | ICD-10-CM

## 2013-03-08 DIAGNOSIS — C773 Secondary and unspecified malignant neoplasm of axilla and upper limb lymph nodes: Secondary | ICD-10-CM

## 2013-03-08 DIAGNOSIS — C50912 Malignant neoplasm of unspecified site of left female breast: Secondary | ICD-10-CM

## 2013-03-08 DIAGNOSIS — E876 Hypokalemia: Secondary | ICD-10-CM

## 2013-03-08 LAB — CBC WITH DIFFERENTIAL/PLATELET
BASO%: 0.3 % (ref 0.0–2.0)
Basophils Absolute: 0 10*3/uL (ref 0.0–0.1)
EOS%: 0.6 % (ref 0.0–7.0)
HCT: 35.7 % (ref 34.8–46.6)
LYMPH%: 40 % (ref 14.0–49.7)
MCH: 31.1 pg (ref 25.1–34.0)
MCHC: 33.6 g/dL (ref 31.5–36.0)
MCV: 92.5 fL (ref 79.5–101.0)
NEUT%: 49.2 % (ref 38.4–76.8)
Platelets: 92 10*3/uL — ABNORMAL LOW (ref 145–400)

## 2013-03-08 LAB — COMPREHENSIVE METABOLIC PANEL (CC13)
AST: 29 U/L (ref 5–34)
Alkaline Phosphatase: 83 U/L (ref 40–150)
BUN: 10.7 mg/dL (ref 7.0–26.0)
Calcium: 9.9 mg/dL (ref 8.4–10.4)
Creatinine: 0.8 mg/dL (ref 0.6–1.1)
Glucose: 154 mg/dl — ABNORMAL HIGH (ref 70–140)

## 2013-03-08 MED ORDER — HEPARIN SOD (PORK) LOCK FLUSH 100 UNIT/ML IV SOLN
500.0000 [IU] | Freq: Once | INTRAVENOUS | Status: AC | PRN
  Administered 2013-03-08: 500 [IU]
  Filled 2013-03-08: qty 5

## 2013-03-08 MED ORDER — DEXAMETHASONE SODIUM PHOSPHATE 20 MG/5ML IJ SOLN
20.0000 mg | Freq: Once | INTRAMUSCULAR | Status: AC
  Administered 2013-03-08: 20 mg via INTRAVENOUS

## 2013-03-08 MED ORDER — DIPHENHYDRAMINE HCL 50 MG/ML IJ SOLN
50.0000 mg | Freq: Once | INTRAMUSCULAR | Status: AC
  Administered 2013-03-08: 50 mg via INTRAVENOUS

## 2013-03-08 MED ORDER — FAMOTIDINE IN NACL 20-0.9 MG/50ML-% IV SOLN
20.0000 mg | Freq: Once | INTRAVENOUS | Status: AC
  Administered 2013-03-08: 20 mg via INTRAVENOUS

## 2013-03-08 MED ORDER — SODIUM CHLORIDE 0.9 % IJ SOLN
10.0000 mL | INTRAMUSCULAR | Status: DC | PRN
  Administered 2013-03-08: 10 mL
  Filled 2013-03-08: qty 10

## 2013-03-08 MED ORDER — SODIUM CHLORIDE 0.9 % IV SOLN
300.0000 mg | Freq: Once | INTRAVENOUS | Status: AC
  Administered 2013-03-08: 300 mg via INTRAVENOUS
  Filled 2013-03-08: qty 30

## 2013-03-08 MED ORDER — PACLITAXEL CHEMO INJECTION 300 MG/50ML
80.0000 mg/m2 | Freq: Once | INTRAVENOUS | Status: AC
  Administered 2013-03-08: 162 mg via INTRAVENOUS
  Filled 2013-03-08: qty 27

## 2013-03-08 MED ORDER — ONDANSETRON 16 MG/50ML IVPB (CHCC)
16.0000 mg | Freq: Once | INTRAVENOUS | Status: AC
  Administered 2013-03-08: 16 mg via INTRAVENOUS

## 2013-03-08 MED ORDER — SODIUM CHLORIDE 0.9 % IV SOLN
Freq: Once | INTRAVENOUS | Status: AC
  Administered 2013-03-08: 15:00:00 via INTRAVENOUS

## 2013-03-08 NOTE — Progress Notes (Signed)
Pt saw Lillia Abed, NP prior to chemo today.  Proceed with chemo despite platelet counts 92 as per NP.

## 2013-03-08 NOTE — Progress Notes (Signed)
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   PRIOR THERAPY:  1. Patient has history of right breast cancer patient underwent a mastectomy when she was 57 years old. This occurred during her pregnancy. After that patient did not take any kind of further treatments. She continued to do well.   2.Recently she noted changes in her nipple had become itchy and retracted. Because of this she underwent mammogram with ultrasound that showed 2 areas in the left breast one at 12:00 position and a second at 3:00 position. She also had an ultrasound performed that showed the diameter to be at least 6 cm and multiple left axillary lymph nodes that were abnormal. She then went on to have a biopsy performed on 09/30/2012. 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 Patient went on to have MRIs of the breasts performed and was noted to be marked diffuse enhancement involving majority of the left breast measuring 13.0 x 12.5 x 9.5 cm. There was diffuse skin thickening and dermal enhancement. Multiple enlarged level I left axillary lymph nodes. The largest lymph node measured 2.4 cm in size. The large area of enhancement within the left breast extends posteriorly to abut the anterior portion of the pectoralis muscle. There was no evidence of internal mammary adenopathy or right axillary adenopathy.  PET/CT did show the left breast mass, and left axillary and subpectoralis nodal metastases, but no further areas throughout the chest/abd/pelvis of metastases.   3.  Patient currently undergoing  neoadjuvant chemotherapy with FEC x 6 cycles starting on 11/02/12, which will be followed by 12 cycles of weekly Taxol/Carbo that started on 02/08/13.  CURRENT THERAPY: Taxol Carbo week 5  INTERVAL HISTORY: Christine [name] 57 y.o. female returns for follow up today prior to her fifth cycle of Taxol/Carbo.  She is doing well today.  The numbness in her fingertips is slightly improved with the Gabapentin.  She continues to have her motor function.  She denies fevers, chills, nausea, vomiting, constipation, diarrhea, or any further concerns. She denies easy bruising/bleeding.  She does have some occasional bloody nasal mucous when she blows her nose, however is using saline nasal spray BID, and denies any dripping of blood from her nares. A 10 point ROS is negative.  Marland Kitchen    MEDICAL HISTORY: Past Medical History  Diagnosis Date  . ALLERGIC RHINITIS   . GERD (gastroesophageal reflux disease)   . Hypertension   . Hypothyroidism   . Hx of colonic polyps   . Wears glasses   . Breast cancer 55, age 43    right  . Breast cancer 09/30/12    left, ER/PR -, Her 2 -    ALLERGIES:  is allergic to sulfa antibiotics and sulfonamide derivatives.  MEDICATIONS:  Current Outpatient Prescriptions  Medication Sig Dispense Refill  . dexamethasone (DECADRON) 4 MG tablet TAKE 2 TABS PO QD ON DAY AFTER CHEMO THEN 2 TABS BID X 2 DAYS  30 tablet  1  . diltiazem (CARDIZEM CD) 240 MG 24 hr capsule Take 1 capsule (240 mg total) by mouth daily.  90 capsule  2  .  diltiazem (CARDIZEM CD) 240 MG 24 hr capsule TAKE ONE CAPSULE BY MOUTH EVERY DAY  90 capsule  3  . doxycycline (VIBRA-TABS) 100 MG tablet Take 1 tablet (100 mg total) by mouth 2 (two) times daily.  28 tablet  2  . fluconazole (DIFLUCAN) 200 MG tablet Take 1 tablet (200 mg total) by mouth every other day.  10 tablet  2  . gabapentin (NEURONTIN) 100 MG capsule Take 1 capsule (100 mg total) by mouth 3 (three) times daily.  90 capsule  2  . hydrochlorothiazide  (MICROZIDE) 12.5 MG capsule TAKE 1 CAPSULE (12.5 MG TOTAL) BY MOUTH EVERY MORNING.  90 capsule  2  . latanoprost (XALATAN) 0.005 % ophthalmic solution Place 1 drop into both eyes at bedtime.       Marland Kitchen levothyroxine (SYNTHROID, LEVOTHROID) 50 MCG tablet TAKE 1 TABLET (50 MCG TOTAL) BY MOUTH DAILY.  90 tablet  2  . lidocaine-prilocaine (EMLA) cream Apply topically as needed.  30 g  6  . LORazepam (ATIVAN) 0.5 MG tablet Take 1 tablet (0.5 mg total) by mouth every 6 (six) hours as needed (Nausea or vomiting).  30 tablet  0  . NEXIUM 40 MG capsule TAKE 1 CAPSULE (40 MG TOTAL) BY MOUTH DAILY BEFORE BREAKFAST.  90 capsule  1  . nystatin (MYCOSTATIN) 100000 UNIT/ML suspension Take 5 mLs (500,000 Units total) by mouth 4 (four) times daily.  240 mL  1  . PATADAY 0.2 % SOLN       . potassium chloride SA (K-DUR,KLOR-CON) 20 MEQ tablet Take 2 tablets (40 mEq total) by mouth 2 (two) times daily.  120 tablet  0   No current facility-administered medications for this visit.    SURGICAL HISTORY:  Past Surgical History  Procedure Laterality Date  . Tonsillectomy    . Caesarean section      x 1  . Mastectomy  04/1987    right side  . Abdominal hysterectomy  06/1988    fibroids  . Portacath placement Right 10/28/2012    Procedure: PORT PLACEMENT;  Surgeon: Clovis Pu. Cornett, MD;  Location: [facility];  Service: General;  Laterality: Right;  Right Subclavian Vein    REVIEW OF SYSTEMS:  General: fatigue (+), night sweats (-), fever (-), pain (-) Lymph: palpable nodes (-) HEENT: vision changes (-), mucositis (-), gum bleeding (-), epistaxis (-) Cardiovascular: chest pain (-), palpitations (-) Pulmonary: shortness of breath (-), dyspnea on exertion (-), cough (-), hemoptysis (-) GI:  Early satiety (-), melena (-), dysphagia (-), nausea/vomiting (-), diarrhea (-) GU: dysuria (-), hematuria (-), incontinence (-) Musculoskeletal: joint swelling (-), joint pain (-), back pain (-) Neuro: weakness  (-), numbness (-), headache (-), confusion (-) Skin: Rash (-), lesions (-), dryness (-) Psych: depression (-), suicidal/homicidal ideation (-), feeling of hopelessness (-)   PHYSICAL EXAMINATION: Blood pressure 151/91, pulse 109, temperature 98.4 F (36.9 C), temperature source Oral, resp. rate 20, height 5\' 4"  (1.626 m), weight 207 lb 6.4 oz (94.076 kg). Body mass index is 35.58 kg/(m^2). General: Patient is a well appearing female in no acute distress HEENT: PERRLA, sclerae anicteric no conjunctival pallor, MMM. Ulcer on left lateral tongue and right buccal mucosa Neck: supple, no palpable adenopathy Lungs: clear to auscultation bilaterally, no wheezes, rhonchi, or rales Cardiovascular: regular rate rhythm, S1, S2, no murmurs, rubs or gallops Abdomen: Soft, non-tender, non-distended, normoactive bowel sounds, no HSM Extremities: warm and well perfused, no clubbing, cyanosis, or edema Skin: No rashes or lesions Neuro:  Non-focal Breasts: Right mastectomy site without nodularity or sign of recurrence.  Left breast mass palpable along with dimpling of skin and inversion of the nipple--much softer.  Right chest wall port now closed ECOG PERFORMANCE STATUS: 1 - Symptomatic but completely ambulatory  LABORATORY DATA: Lab Results  Component Value Date   WBC 3.6* 03/08/2013   HGB 12.0 03/08/2013   HCT 35.7 03/08/2013   MCV 92.5 03/08/2013   PLT 92* 03/08/2013      Chemistry      Component Value Date/Time   NA 143 03/01/2013 1312   NA 144 10/28/2012 1248   K 3.4* 03/01/2013 1312   K 3.9 10/28/2012 1248   CL 101 01/25/2013 0952   CL 108 10/28/2012 1248   CO2 28 03/01/2013 1312   CO2 29 09/13/2011 1910   BUN 10.8 03/01/2013 1312   BUN 10 10/28/2012 1248   CREATININE 0.7 03/01/2013 1312   CREATININE 0.80 10/28/2012 1248      Component Value Date/Time   CALCIUM 9.2 03/01/2013 1312   CALCIUM 9.4 09/13/2011 1910   ALKPHOS 71 03/01/2013 1312   ALKPHOS 78 09/13/2011 1910   AST 23 03/01/2013 1312   AST 18  09/13/2011 1910   ALT 27 03/01/2013 1312   ALT 12 09/13/2011 1910   BILITOT 0.37 03/01/2013 1312   BILITOT 0.3 09/13/2011 1910       RADIOGRAPHIC STUDIES:  Ct Chest W Contrast  10/27/2012  *RADIOLOGY REPORT*  Clinical Data:  High risk breast cancer staging.  Right breast cancer diagnosed 1988.  Left breast cancer diagnosed February 2014.  CT CHEST, ABDOMEN AND PELVIS WITH CONTRAST  Technique:  Multidetector CT imaging of the chest, abdomen and pelvis was performed following the standard protocol during bolus administration of intravenous contrast.  Contrast: OMNIPAQUE IOHEXOL 300 MG/ML  SOLN  Comparison:  PET CT scan 03/26 1014  CT CHEST  Findings:  Right breast mastectomy anatomy.  There is thickening of the skin of the left breast to 11 mm.  There is a mass within the deep posterior left breast measuring 29 x 30 mm (image 6).  There are enlarged round abnormal lymph nodes in the left axilla measuring up to 20 mm short axis (image 22).  Lymph nodes extend beneath the sub pectoralis muscles on the left (image 12).  There is no evidence of internal mammary lymphadenopathy.  No infraclavicular adenopathy.  No mediastinal adenopathy.  No pericardial fluid.  No suspicious pulmonary nodules.  IMPRESSION:  1.  Left breast mass consistent with breast carcinoma. 2.  Skin thickening over the left breast is concerning for inflammatory breast cancer. 3.  Nodal metastasis in the left axilla and sub pectoralis nodal stations. 4.  No evidence of central nodal metastasis or mediastinal metastasis  CT ABDOMEN AND PELVIS  Findings:  No focal hepatic lesion.  The gallbladder, pancreas, spleen, adrenal gland is, and kidneys are normal.  The stomach, small bowel, appendix, cecum are normal.  The colon and rectosigmoid colon are normal.  Abdominal aorta is normal caliber.  No retroperitoneal or periportal lymphadenopathy.  No mesenteric or peritoneal disease.  No free fluid in  the pelvis.  Post hysterectomy anatomy.  No  pelvic lymphadenopathy. Review of  bone windows demonstrates no aggressive osseous lesions.  IMPRESSION:  No evidence of metastasis in the abdomen or pelvis.   Original Report Authenticated By: Genevive Bi, M.D.    Ct Abdomen Pelvis W Contrast  10/27/2012  *RADIOLOGY REPORT*  Clinical Data:  High risk  breast cancer staging.  Right breast cancer diagnosed 1988.  Left breast cancer diagnosed February 2014.  CT CHEST, ABDOMEN AND PELVIS WITH CONTRAST  Technique:  Multidetector CT imaging of the chest, abdomen and pelvis was performed following the standard protocol during bolus administration of intravenous contrast.  Contrast: OMNIPAQUE IOHEXOL 300 MG/ML  SOLN  Comparison:  PET CT scan 03/26 1014  CT CHEST  Findings:  Right breast mastectomy anatomy.  There is thickening of the skin of the left breast to 11 mm.  There is a mass within the deep posterior left breast measuring 29 x 30 mm (image 6).  There are enlarged round abnormal lymph nodes in the left axilla measuring up to 20 mm short axis (image 22).  Lymph nodes extend beneath the sub pectoralis muscles on the left (image 12).  There is no evidence of internal mammary lymphadenopathy.  No infraclavicular adenopathy.  No mediastinal adenopathy.  No pericardial fluid.  No suspicious pulmonary nodules.  IMPRESSION:  1.  Left breast mass consistent with breast carcinoma. 2.  Skin thickening over the left breast is concerning for inflammatory breast cancer. 3.  Nodal metastasis in the left axilla and sub pectoralis nodal stations. 4.  No evidence of central nodal metastasis or mediastinal metastasis  CT ABDOMEN AND PELVIS  Findings:  No focal hepatic lesion.  The gallbladder, pancreas, spleen, adrenal gland is, and kidneys are normal.  The stomach, small bowel, appendix, cecum are normal.  The colon and rectosigmoid colon are normal.  Abdominal aorta is normal caliber.  No retroperitoneal or periportal lymphadenopathy.  No mesenteric or peritoneal  disease.  No free fluid in  the pelvis.  Post hysterectomy anatomy.  No pelvic lymphadenopathy. Review of  bone windows demonstrates no aggressive osseous lesions.  IMPRESSION:  No evidence of metastasis in the abdomen or pelvis.   Original Report Authenticated By: Genevive Bi, M.D.    Mr Breast Bilateral W Wo Contrast  10/12/2012  *RADIOLOGY REPORT*  Clinical Data: History of previous malignant mastectomy of the right breast 25 years ago.  The patient developed diffuse skin thickening and firmness within the left breast.  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  BUN and creatinine were obtained on site at [facility] at [address] Results:  BUN 7 mg/dL,  Creatinine 1.0 mg/dL.  BILATERAL BREAST MRI WITH AND WITHOUT CONTRAST  Technique: Multiplanar, multisequence MR images of both breasts were obtained prior to and following the intravenous administration of 20ml of Multihance.  Three dimensional images were evaluated at the independent DynaCad workstation.  Comparison:  Mammograms dated 09/30/2012 and 09/28/2012.  Findings: There has been a previous right mastectomy.  There is marked diffuse enhancement involving the majority of the left breast.  This measures 13.0 x 12.5 x 9.5 cm in size.  This is associated with a mixture of plateau and washout enhancement kinetics.  In addition,  there is diffuse skin thickening and dermal enhancement.  There are multiple enlarged level I left axillary lymph nodes.  The largest lymph node measures 2.4 cm in size.  The large area of enhancement within the left breast extends posteriorly to abut the anterior portion of the pectoral muscle. There is no evidence for internal mammary adenopathy or right axillary adenopathy.  There are no additional findings.  IMPRESSION:  1.  Large ( 13 cm) area of enhancement involving the majority of  the left breast consistent with the patient's known invasive ductal carcinoma and DCIS. This extends posteriorly to abut the pectoralis muscle. 2.  Multiple enlarged level I left axillary lymph nodes. 3.  Previous right mastectomy.  RECOMMENDATION: Treatment plan  THREE-DIMENSIONAL MR IMAGE RENDERING ON INDEPENDENT WORKSTATION:  Three-dimensional MR images were rendered by post-processing of the original MR data on an independent workstation.  The three- dimensional MR images were interpreted, and findings were reported in the accompanying complete MRI report for this study.  BI-RADS CATEGORY 6:  Known biopsy-proven malignancy - appropriate action should be taken.   Original Report Authenticated By: Rolla Plate, M.D.    Nm Pet Image Initial (pi) Skull Base To Thigh  10/27/2012  *RADIOLOGY REPORT*  Clinical Data: Initial treatment strategy for high risk of breast cancer. Triple negative  NUCLEAR MEDICINE PET SKULL BASE TO THIGH  Fasting Blood Glucose:  97  Technique:  17.9 mCi F-18 FDG was injected intravenously. CT data was obtained and used for attenuation correction and anatomic localization only.  (This was not acquired as a diagnostic CT examination.) Additional exam technical data entered on technologist worksheet.  Comparison:  Findings:  Neck: No hypermetabolic lymph nodes in the neck.  Chest:  There is intense metabolic activity ( SUV max = 16.1) associated with a large portion of the glandular tissue of the left breast consistent with carcinoma.  There are intensely hypermetabolic left axillary lymph nodes with SUV max = 15.1.  These nodes are rounded and measure up to 2 cm. Hypermetabolic nodes extend to the sub pectoralis nodal station (image 71).  There is a single hypermetabolic internal mammary lymph node measuring 5 mm just left of the sternum (image 87).  This has intense metabolic activity for size with SUV max = 7.1.  No hypermetabolic mediastinal lymph nodes.  No hypermetabolic pulmonary  nodules.  Abdomen/Pelvis:  No abnormal hypermetabolic activity within the liver, pancreas, adrenal glands, or spleen.  No hypermetabolic lymph nodes in the abdomen or pelvis.  Skeleton:  No focal hypermetabolic activity to suggest skeletal metastasis.  IMPRESSION:  1.  Hypermetabolic breast tissue on the left consistent with primary carcinoma. 2.  Hypermetabolic nodal metastasis in the left axilla extending to the sub pectoralis nodes. 3.  Single hypermetabolic left internal mammary lymph node.  4.  No evidence of pulmonary metastasis.  5.  No evidence of metastasis beneath hemidiaphragms or within the skeleton.   Original Report Authenticated By: Genevive Bi, M.D.    Dg Chest Port 1 View  10/28/2012  *RADIOLOGY REPORT*  Clinical Data: Port-A-Cath placement.  PORTABLE CHEST - 1 VIEW  Comparison: None.  Findings: The patient is rotated on the study.  Right side Port-A- Cath is in place.  Tubing appears intact.  Tip of the catheter projects in the mid to lower superior vena cava.  No pneumothorax identified.  Heart size is normal.  Lungs are clear.  The patient is status post right mastectomy and axillary dissection.  IMPRESSION: Tip of Port-A-Cath projects over the mid to lower superior vena cava.  The patient is rotated on the study.  Exact position of the tip of the catheter could be determined with the lateral film. No pneumothorax.   Original Report Authenticated By: Holley Dexter, M.D.    Dg Fluoro Guide Cv Line-no Report  10/28/2012  CLINICAL DATA: [facility]   FLOURO GUIDE CV LINE  Fluoroscopy was utilized by the requesting physician.  No radiographic  interpretation.      ASSESSMENT:  57 year old female with  #1 history of breast cancer of the right breast status post mastectomy when she was 57 years old. Due to this she was referred for genetic testing.   #2 patient now with left invasive ductal carcinoma with ductal carcinoma in situ measuring 13.0 x 12.5 x 9.5 cm on MRI with positive  lymph nodes. Patient's tumor is ER negative PR negative HER-2/neu negative with a Ki-67 of 21%. Patient is being seen in medical oncology for neoadjuvant chemotherapy. Patient and I discussed her pathology in detail. We discussed treatment for breast cancer including surgical and with chemotherapy. Patient understands that she may still need a mastectomy however by giving her neoadjuvant chemotherapy her surgery may be made a little bit easier hopefully with giving her negative margins. She understands that neoadjuvant chemotherapy will also treat her distant disease.I discussed type of chemotherapy that she should have. This would include dose dense FEC x6 cycles followed by weekly Taxol carboplatinum for 12 weeks.  #3 Patient underwent staging studies with PET/CT, and received echo, port placement and chemotherapy class.    #4 Open port wound, healed  #5 Hypokalemia  PLAN:   1. Doing well.  She will proceed with chemotherapy.        2. Ms. Luevano will return next week for labs, an appt and cycle 6 of Taxol/Carbo and possible blood transfusion.      3. Her potassium level is pending.  We will contact her with instructions.   All questions were answered. The patient knows to call the clinic with any problems, questions or concerns. We can certainly see the patient much sooner if necessary.  I spent 25 minutes counseling the patient face to face. The total time spent in the appointment was 30 minutes.  Cherie Ouch Lyn Hollingshead, NP Medical Oncology [facility] Phone: [phone] 03/08/2013, 2:06 PM

## 2013-03-08 NOTE — Patient Instructions (Signed)
[facility] Discharge Instructions for Patients Receiving Chemotherapy  Today you received the following chemotherapy agents :  Taxol, Carboplatin.  To help prevent nausea and vomiting after your treatment, we encourage you to take your nausea medication as instructed by your physician.   If you develop nausea and vomiting that is not controlled by your nausea medication, call the clinic.   BELOW ARE SYMPTOMS THAT SHOULD BE REPORTED IMMEDIATELY:  *FEVER GREATER THAN 100.5 F  *CHILLS WITH OR WITHOUT FEVER  NAUSEA AND VOMITING THAT IS NOT CONTROLLED WITH YOUR NAUSEA MEDICATION  *UNUSUAL SHORTNESS OF BREATH  *UNUSUAL BRUISING OR BLEEDING  TENDERNESS IN MOUTH AND THROAT WITH OR WITHOUT PRESENCE OF ULCERS  *URINARY PROBLEMS  *BOWEL PROBLEMS  UNUSUAL RASH Items with * indicate a potential emergency and should be followed up as soon as possible.  Feel free to call the clinic you have any questions or concerns. The clinic phone number is [phone].    

## 2013-03-09 ENCOUNTER — Encounter: Payer: Self-pay | Admitting: Family

## 2013-03-09 ENCOUNTER — Telehealth: Payer: Self-pay | Admitting: Internal Medicine

## 2013-03-09 ENCOUNTER — Ambulatory Visit (INDEPENDENT_AMBULATORY_CARE_PROVIDER_SITE_OTHER): Payer: Self-pay | Admitting: Family

## 2013-03-09 VITALS — BP 174/102 | HR 116 | Wt 206.0 lb

## 2013-03-09 DIAGNOSIS — C50919 Malignant neoplasm of unspecified site of unspecified female breast: Secondary | ICD-10-CM

## 2013-03-09 DIAGNOSIS — I10 Essential (primary) hypertension: Secondary | ICD-10-CM

## 2013-03-09 DIAGNOSIS — C50912 Malignant neoplasm of unspecified site of left female breast: Secondary | ICD-10-CM

## 2013-03-09 MED ORDER — LOSARTAN POTASSIUM-HCTZ 50-12.5 MG PO TABS: 1.0000 | ORAL_TABLET | Freq: Every day | ORAL | Status: DC

## 2013-03-09 NOTE — Progress Notes (Signed)
Subjective:    Patient ID: Christine [name], female    DOB: 1955/08/23, 57 y.o.   MRN: [id number]  HPI 57 year old African American female, nonsmoker, and family concerns of elevated blood pressure. Patient was recently diagnosed with breast cancer of her left breast and is undergoing chemotherapy. 27 years ago she had cancer in her right breast. Side effect of chemotherapy caused her blood pressure to be elevated. Her blood pressure has been elevated in the 160s to 170s systolically since she's been taking chemotherapy. Denies any headache, lightheadedness, dizziness, chest pain, palpitations, shortness of breath or edema.   Review of Systems  Constitutional: Negative.   HENT: Negative.   Respiratory: Negative.   Cardiovascular: Negative.   Gastrointestinal: Negative.   Genitourinary: Negative.   Musculoskeletal: Negative.   Skin:       Left breast cancer  Allergic/Immunologic: Negative.   Neurological: Negative.   Hematological: Negative.   Psychiatric/Behavioral: Negative.    Past Medical History  Diagnosis Date  . ALLERGIC RHINITIS   . GERD (gastroesophageal reflux disease)   . Hypertension   . Hypothyroidism   . Hx of colonic polyps   . Wears glasses   . Breast cancer 34, age 78    right  . Breast cancer 09/30/12    left, ER/PR -, Her 2 -    History   Social History  . Marital Status: Married    Spouse Name: N/A    Number of Children: N/A  . Years of Education: N/A   Occupational History  . Not on file.   Social History Main Topics  . Smoking status: Never Smoker   . Smokeless tobacco: Never Used  . Alcohol Use: No  . Drug Use: No  . Sexually Active: Not Currently     Comment: menarche 32, 1st pregnancy age 65-miscarr, age 65 1st live birth, menop 77   Other Topics Concern  . Not on file   Social History Narrative  . No narrative on file    Past Surgical History  Procedure Laterality Date  . Tonsillectomy    . Caesarean section      x 1  .  Mastectomy  04/1987    right side  . Abdominal hysterectomy  06/1988    fibroids  . Portacath placement Right 10/28/2012    Procedure: PORT PLACEMENT;  Surgeon: Clovis Pu. Cornett, MD;  Location: [facility];  Service: General;  Laterality: Right;  Right Subclavian Vein    Family History  Problem Relation Age of Onset  . Colon cancer Mother 1    family hx of colon ca 1st degree relative <60  . Coronary artery disease Father     family hx of CAD female 1st degree relative <50  . Stomach cancer Neg Hx   . Rectal cancer Neg Hx   . Esophageal cancer Neg Hx   . Breast cancer Maternal Grandmother 80  . Diabetes Paternal Grandmother   . Breast cancer Cousin 85    maternal cousin  . Coronary artery disease Maternal Grandfather   . Breast cancer Sister     paternal half sister; died in her 17s    Allergies  Allergen Reactions  . Sulfa Antibiotics Hives, Itching and Swelling  . Sulfonamide Derivatives Hives, Itching and Swelling    Current Outpatient Prescriptions on File Prior to Visit  Medication Sig Dispense Refill  . dexamethasone (DECADRON) 4 MG tablet TAKE 2 TABS PO QD ON DAY AFTER CHEMO THEN 2 TABS BID X 2  DAYS  30 tablet  1  . diltiazem (CARDIZEM CD) 240 MG 24 hr capsule Take 1 capsule (240 mg total) by mouth daily.  90 capsule  2  . diltiazem (CARDIZEM CD) 240 MG 24 hr capsule TAKE ONE CAPSULE BY MOUTH EVERY DAY  90 capsule  3  . doxycycline (VIBRA-TABS) 100 MG tablet Take 1 tablet (100 mg total) by mouth 2 (two) times daily.  28 tablet  2  . fluconazole (DIFLUCAN) 200 MG tablet Take 1 tablet (200 mg total) by mouth every other day.  10 tablet  2  . gabapentin (NEURONTIN) 100 MG capsule Take 1 capsule (100 mg total) by mouth 3 (three) times daily.  90 capsule  2  . latanoprost (XALATAN) 0.005 % ophthalmic solution Place 1 drop into both eyes at bedtime.       Marland Kitchen levothyroxine (SYNTHROID, LEVOTHROID) 50 MCG tablet TAKE 1 TABLET (50 MCG TOTAL) BY MOUTH DAILY.  90 tablet   2  . lidocaine-prilocaine (EMLA) cream Apply topically as needed.  30 g  6  . LORazepam (ATIVAN) 0.5 MG tablet Take 1 tablet (0.5 mg total) by mouth every 6 (six) hours as needed (Nausea or vomiting).  30 tablet  0  . NEXIUM 40 MG capsule TAKE 1 CAPSULE (40 MG TOTAL) BY MOUTH DAILY BEFORE BREAKFAST.  90 capsule  1  . nystatin (MYCOSTATIN) 100000 UNIT/ML suspension Take 5 mLs (500,000 Units total) by mouth 4 (four) times daily.  240 mL  1  . PATADAY 0.2 % SOLN       . potassium chloride SA (K-DUR,KLOR-CON) 20 MEQ tablet Take 2 tablets (40 mEq total) by mouth 2 (two) times daily.  120 tablet  0   No current facility-administered medications on file prior to visit.    BP 174/102  Pulse 116  Wt 206 lb (93.441 kg)  BMI 35.34 kg/m2chart    Objective:   Physical Exam  Constitutional: She is oriented to person, place, and time. She appears well-developed and well-nourished.  HENT:  Right Ear: External ear normal.  Left Ear: External ear normal.  Nose: Nose normal.  Mouth/Throat: Oropharynx is clear and moist.  Neck: Normal range of motion. Neck supple. No thyromegaly present.  Cardiovascular: Normal rate, regular rhythm and normal heart sounds.   Pulmonary/Chest: Effort normal and breath sounds normal.  Neurological: She is alert and oriented to person, place, and time.  Skin: Skin is warm and dry.  Psychiatric: She has a normal mood and affect.          Assessment & Plan:  Assessment: 1. Hypertension-uncontrolled 2. Breast cancer-left  Plan: Start Hyzaar 50/12.5 once daily. Discontinue hydrochlorothiazide. Patient to call with blood pressure readings over the next 2-3 weeks. She sees the cancer center weekly. Followup scan and decide if we need to make further adjustments at that time.

## 2013-03-09 NOTE — Patient Instructions (Signed)

## 2013-03-09 NOTE — Telephone Encounter (Signed)
Patient Information:  Caller Name: Damaria  Phone: [phone]  Patient: Christine [name], Christine [name]  Gender: Female  DOB: 01/31/1956  Age: 57 Years  PCP: Darryll Capers (Adults only)  Office Follow Up:  Does the office need to follow up with this patient?: No  Instructions For The Office: N/A  RN Note:  Hysterectomy. BP 150/97 Pulse 101 Left arm at 0600.  BP 157/100 03/07/13. Bp 172/98 at time of Chemo 03/01/13.  Symptoms  Reason For Call & Symptoms: BP elevation after blood transfusion.  Currently in pre-operative chemotherapy weekly for breast cancer. Chemo began in April;  Chemo medication changed in July to Taxol and Carboplatin. On Potassium daily.  Reviewed Health History In EMR: Yes  Reviewed Medications In EMR: Yes  Reviewed Allergies In EMR: Yes  Reviewed Surgeries / Procedures: Yes  Date of Onset of Symptoms: 02/24/2013  Treatments Tried: avoiding sodium, taking BP meds as ordered.  Treatments Tried Worked: No  Guideline(s) Used:  High Blood Pressure  Disposition Per Guideline:   See Within 2 Weeks in Office  Reason For Disposition Reached:   BP > 160/100  Advice Given:  General:  Untreated high blood pressure may cause damage to the heart, brain, kidneys, and eyes.  Treatment of high blood pressure can reduce the risk of stroke, heart attack, and heart failure.  The goal of blood pressure treatment for most patients with hypertension is to keep the blood pressure under 140/90.  Call Back If:  Headache, blurred vision, difficulty talking, or difficulty walking occurs  Chest pain or difficulty breathing occurs  You want to go in to the office for a blood pressure check  You become worse.  Patient Will Follow Care Advice:  YES  Appointment Scheduled:  03/09/2013 11:30:00 Appointment Scheduled Provider:  Adline Mango [facility])

## 2013-03-12 ENCOUNTER — Other Ambulatory Visit: Payer: Self-pay | Admitting: Adult Health

## 2013-03-15 ENCOUNTER — Encounter: Payer: Self-pay | Admitting: Oncology

## 2013-03-15 ENCOUNTER — Ambulatory Visit (HOSPITAL_BASED_OUTPATIENT_CLINIC_OR_DEPARTMENT_OTHER): Payer: BC Managed Care – PPO | Admitting: Adult Health

## 2013-03-15 ENCOUNTER — Other Ambulatory Visit (HOSPITAL_BASED_OUTPATIENT_CLINIC_OR_DEPARTMENT_OTHER): Payer: BC Managed Care – PPO | Admitting: Lab

## 2013-03-15 ENCOUNTER — Telehealth: Payer: Self-pay | Admitting: *Deleted

## 2013-03-15 ENCOUNTER — Ambulatory Visit: Payer: BC Managed Care – PPO

## 2013-03-15 VITALS — BP 133/87 | HR 114 | Temp 98.5°F | Resp 20 | Ht 64.0 in | Wt 203.9 lb

## 2013-03-15 DIAGNOSIS — L03032 Cellulitis of left toe: Secondary | ICD-10-CM

## 2013-03-15 DIAGNOSIS — L089 Local infection of the skin and subcutaneous tissue, unspecified: Secondary | ICD-10-CM

## 2013-03-15 DIAGNOSIS — C50919 Malignant neoplasm of unspecified site of unspecified female breast: Secondary | ICD-10-CM

## 2013-03-15 DIAGNOSIS — Z171 Estrogen receptor negative status [ER-]: Secondary | ICD-10-CM

## 2013-03-15 DIAGNOSIS — E876 Hypokalemia: Secondary | ICD-10-CM

## 2013-03-15 DIAGNOSIS — D709 Neutropenia, unspecified: Secondary | ICD-10-CM

## 2013-03-15 DIAGNOSIS — C773 Secondary and unspecified malignant neoplasm of axilla and upper limb lymph nodes: Secondary | ICD-10-CM

## 2013-03-15 DIAGNOSIS — C50912 Malignant neoplasm of unspecified site of left female breast: Secondary | ICD-10-CM

## 2013-03-15 LAB — CBC WITH DIFFERENTIAL/PLATELET
Basophils Absolute: 0 10*3/uL (ref 0.0–0.1)
EOS%: 0.8 % (ref 0.0–7.0)
Eosinophils Absolute: 0 10*3/uL (ref 0.0–0.5)
HCT: 35.8 % (ref 34.8–46.6)
HGB: 12.2 g/dL (ref 11.6–15.9)
MCH: 31 pg (ref 25.1–34.0)
NEUT#: 1 10*3/uL — ABNORMAL LOW (ref 1.5–6.5)
NEUT%: 40 % (ref 38.4–76.8)
RDW: 15.8 % — ABNORMAL HIGH (ref 11.2–14.5)
lymph#: 1.3 10*3/uL (ref 0.9–3.3)

## 2013-03-15 MED ORDER — FILGRASTIM 480 MCG/0.8ML IJ SOLN
480.0000 ug | Freq: Once | INTRAMUSCULAR | Status: AC
  Administered 2013-03-15: 480 ug via SUBCUTANEOUS
  Filled 2013-03-15: qty 0.8

## 2013-03-15 MED ORDER — CEPHALEXIN 500 MG PO CAPS: 500.0000 mg | ORAL_CAPSULE | Freq: Four times a day (QID) | ORAL | Status: DC

## 2013-03-15 NOTE — Patient Instructions (Addendum)
Doing well.  We will not give chemotherapy today due to your low platelets.  I will prescribe antibiotics for your toe.  I have referred you to the foot center for evaluation.    Paronychia Paronychia is an inflammatory reaction involving the folds of the skin surrounding the fingernail. This is commonly caused by an infection in the skin around a nail. The most common cause of paronychia is frequent wetting of the hands (as seen with bartenders, food servers, nurses or others who wet their hands). This makes the skin around the fingernail susceptible to infection by bacteria (germs) or fungus. Other predisposing factors are:  Aggressive manicuring.  Nail biting.  Thumb sucking. The most common cause is a staphylococcal (a type of germ) infection, or a fungal (Candida) infection. When caused by a germ, it usually comes on suddenly with redness, swelling, pus and is often painful. It may get under the nail and form an abscess (collection of pus), or form an abscess around the nail. If the nail itself is infected with a fungus, the treatment is usually prolonged and may require oral medicine for up to one year. Your caregiver will determine the length of time treatment is required. The paronychia caused by bacteria (germs) may largely be avoided by not pulling on hangnails or picking at cuticles. When the infection occurs at the tips of the finger it is called felon. When the cause of paronychia is from the herpes simplex virus (HSV) it is called herpetic whitlow. TREATMENT  When an abscess is present treatment is often incision and drainage. This means that the abscess must be cut open so the pus can get out. When this is done, the following home care instructions should be followed. HOME CARE INSTRUCTIONS   It is important to keep the affected fingers very dry. Rubber or plastic gloves over cotton gloves should be used whenever the hand must be placed in water.  Keep wound clean, dry and dressed as  suggested by your caregiver between warm soaks or warm compresses.  Soak in warm water for fifteen to twenty minutes three to four times per day for bacterial infections. Fungal infections are very difficult to treat, so often require treatment for long periods of time.  For bacterial (germ) infections take antibiotics (medicine which kill germs) as directed and finish the prescription, even if the problem appears to be solved before the medicine is gone.  Only take over-the-counter or prescription medicines for pain, discomfort, or fever as directed by your caregiver. SEEK IMMEDIATE MEDICAL CARE IF:  You have redness, swelling, or increasing pain in the wound.  You notice pus coming from the wound.  You have a fever.  You notice a bad smell coming from the wound or dressing. Document Released: 01/14/2001 Document Revised: 10/13/2011 Document Reviewed: 09/15/2008 [facility] Patient Information 2014 [place], [place].

## 2013-03-15 NOTE — Progress Notes (Signed)
OFFICE PROGRESS NOTE  CC**  Christine Mew, MD [address] 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   PRIOR THERAPY:  1. Patient has history of right breast cancer patient underwent a mastectomy when she was 57 years old. This occurred during her pregnancy. After that patient did not take any kind of further treatments. She continued to do well.   2.Recently she noted changes in her nipple had become itchy and retracted. Because of this she underwent mammogram with ultrasound that showed 2 areas in the left breast one at 12:00 position and a second at 3:00 position. She also had an ultrasound performed that showed the diameter to be at least 6 cm and multiple left axillary lymph nodes that were abnormal. She then went on to have a biopsy performed on 09/30/2012. 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 Patient went on to have MRIs of the breasts performed and was noted to be marked diffuse enhancement involving majority of the left breast measuring 13.0 x 12.5 x 9.5 cm. There was diffuse skin thickening and dermal enhancement. Multiple enlarged level I left axillary lymph nodes. The largest lymph node measured 2.4 cm in size. The large area of enhancement within the left breast extends posteriorly to abut the anterior portion of the pectoralis muscle. There was no evidence of internal mammary adenopathy or right axillary adenopathy.  PET/CT did show the left breast mass, and left axillary and subpectoralis nodal metastases, but no further areas throughout the chest/abd/pelvis of metastases.   3.  Patient currently undergoing  neoadjuvant chemotherapy with FEC x 6 cycles starting on 11/02/12, which will be followed by 12 cycles of weekly Taxol/Carbo that started on 02/08/13.  CURRENT THERAPY: Taxol Carbo week 6  INTERVAL HISTORY: Christine [name] 57 y.o. female returns for follow up today prior to chemotherapy.  She is saw Dr. Lovell Sheehan PA about her hypertension, and was prescribed Losartan.  She has been taking this as prescribed and her blood pressure is improved.  She was vacuuming last week when she hit it, and noticed a foul odor and discharge from the toe two days ago.  It is mildly painful.  The numbness is stable.  Otherwise, she denies fevers, chills, nausea, vomiting, constipation, diarrhea, easy bruising/bleeding.   MEDICAL HISTORY: Past Medical History  Diagnosis Date  . ALLERGIC RHINITIS   . GERD (gastroesophageal reflux disease)   . Hypertension   . Hypothyroidism   . Hx of colonic polyps   . Wears glasses   . Breast cancer 35, age 38    right  . Breast cancer 09/30/12    left, ER/PR -, Her 2 -    ALLERGIES:  is allergic to sulfa antibiotics and sulfonamide derivatives.  MEDICATIONS:  Current Outpatient Prescriptions  Medication Sig Dispense Refill  . dexamethasone (DECADRON) 4 MG tablet TAKE 2 TABS PO QD ON DAY AFTER CHEMO THEN 2 TABS BID X 2 DAYS  30 tablet  1  . diltiazem (CARDIZEM CD) 240 MG 24 hr capsule TAKE ONE CAPSULE BY MOUTH EVERY DAY  90 capsule  3  . doxycycline (VIBRA-TABS) 100 MG tablet Take 1 tablet (100 mg total) by mouth  2 (two) times daily.  28 tablet  2  . fluconazole (DIFLUCAN) 200 MG tablet Take 1 tablet (200 mg total) by mouth every other day.  10 tablet  2  . gabapentin (NEURONTIN) 100 MG capsule Take 1 capsule (100 mg total) by mouth 3 (three) times daily.  90 capsule  2  . latanoprost (XALATAN) 0.005 % ophthalmic solution Place 1 drop into both eyes at bedtime.       Marland Kitchen levothyroxine (SYNTHROID, LEVOTHROID) 50 MCG tablet TAKE 1 TABLET (50 MCG TOTAL) BY MOUTH DAILY.  90 tablet   2  . lidocaine-prilocaine (EMLA) cream Apply topically as needed.  30 g  6  . LORazepam (ATIVAN) 0.5 MG tablet Take 1 tablet (0.5 mg total) by mouth every 6 (six) hours as needed (Nausea or vomiting).  30 tablet  0  . losartan-hydrochlorothiazide (HYZAAR) 50-12.5 MG per tablet Take 1 tablet by mouth daily.  30 tablet  3  . NEXIUM 40 MG capsule TAKE 1 CAPSULE (40 MG TOTAL) BY MOUTH DAILY BEFORE BREAKFAST.  90 capsule  1  . nystatin (MYCOSTATIN) 100000 UNIT/ML suspension Take 5 mLs (500,000 Units total) by mouth 4 (four) times daily.  240 mL  1  . potassium chloride SA (K-DUR,KLOR-CON) 20 MEQ tablet Take 2 tablets (40 mEq total) by mouth 2 (two) times daily.  120 tablet  0  . cephALEXin (KEFLEX) 500 MG capsule Take 1 capsule (500 mg total) by mouth 4 (four) times daily.  40 capsule  0   No current facility-administered medications for this visit.    SURGICAL HISTORY:  Past Surgical History  Procedure Laterality Date  . Tonsillectomy    . Caesarean section      x 1  . Mastectomy  04/1987    right side  . Abdominal hysterectomy  06/1988    fibroids  . Portacath placement Right 10/28/2012    Procedure: PORT PLACEMENT;  Surgeon: Clovis Pu. Cornett, MD;  Location: [facility];  Service: General;  Laterality: Right;  Right Subclavian Vein    REVIEW OF SYSTEMS:  General: fatigue (+), night sweats (-), fever (-), pain (-) Lymph: palpable nodes (-) HEENT: vision changes (-), mucositis (-), gum bleeding (-), epistaxis (-) Cardiovascular: chest pain (-), palpitations (-) Pulmonary: shortness of breath (-), dyspnea on exertion (-), cough (-), hemoptysis (-) GI:  Early satiety (-), melena (-), dysphagia (-), nausea/vomiting (-), diarrhea (-) GU: dysuria (-), hematuria (-), incontinence (-) Musculoskeletal: joint swelling (-), joint pain (-), back pain (-) Neuro: weakness (-), numbness (-), headache (-), confusion (-) Skin: Rash (-), lesions (-), dryness (-) Psych: depression (-),  suicidal/homicidal ideation (-), feeling of hopelessness (-)   PHYSICAL EXAMINATION: Blood pressure 133/87, pulse 114, temperature 98.5 F (36.9 C), temperature source Oral, resp. rate 20, height 5\' 4"  (1.626 m), weight 203 lb 14.4 oz (92.488 kg). Body mass index is 34.98 kg/(m^2). General: Patient is a well appearing female in no acute distress HEENT: PERRLA, sclerae anicteric no conjunctival pallor, MMM. Ulcer on left lateral tongue and right buccal mucosa Neck: supple, no palpable adenopathy Lungs: clear to auscultation bilaterally, no wheezes, rhonchi, or rales Cardiovascular: regular rate rhythm, S1, S2, no murmurs, rubs or gallops Abdomen: Soft, non-tender, non-distended, normoactive bowel sounds, no HSM Extremities: warm and well perfused, no clubbing, cyanosis, or edema Skin: No rashes or lesions, left great toe with foul odor no purulent exudate, nail appears detached from most of the nailbed with surrounding erythema. Neuro: Non-focal Breasts: Right mastectomy site  without nodularity or sign of recurrence.  Left breast mass palpable along with dimpling of skin and inversion of the nipple--much softer.  Right chest wall port now closed ECOG PERFORMANCE STATUS: 1 - Symptomatic but completely ambulatory  LABORATORY DATA: Lab Results  Component Value Date   WBC 2.6* 03/15/2013   HGB 12.2 03/15/2013   HCT 35.8 03/15/2013   MCV 90.9 03/15/2013   PLT 77* 03/15/2013      Chemistry      Component Value Date/Time   NA 141 03/08/2013 1346   NA 144 10/28/2012 1248   K 3.5 03/08/2013 1346   K 3.9 10/28/2012 1248   CL 101 01/25/2013 0952   CL 108 10/28/2012 1248   CO2 26 03/08/2013 1346   CO2 29 09/13/2011 1910   BUN 10.7 03/08/2013 1346   BUN 10 10/28/2012 1248   CREATININE 0.8 03/08/2013 1346   CREATININE 0.80 10/28/2012 1248      Component Value Date/Time   CALCIUM 9.9 03/08/2013 1346   CALCIUM 9.4 09/13/2011 1910   ALKPHOS 83 03/08/2013 1346   ALKPHOS 78 09/13/2011 1910   AST 29 03/08/2013 1346    AST 18 09/13/2011 1910   ALT 27 03/08/2013 1346   ALT 12 09/13/2011 1910   BILITOT 0.40 03/08/2013 1346   BILITOT 0.3 09/13/2011 1910       RADIOGRAPHIC STUDIES:  Ct Chest W Contrast  10/27/2012  *RADIOLOGY REPORT*  Clinical Data:  High risk breast cancer staging.  Right breast cancer diagnosed 1988.  Left breast cancer diagnosed February 2014.  CT CHEST, ABDOMEN AND PELVIS WITH CONTRAST  Technique:  Multidetector CT imaging of the chest, abdomen and pelvis was performed following the standard protocol during bolus administration of intravenous contrast.  Contrast: OMNIPAQUE IOHEXOL 300 MG/ML  SOLN  Comparison:  PET CT scan 03/26 1014  CT CHEST  Findings:  Right breast mastectomy anatomy.  There is thickening of the skin of the left breast to 11 mm.  There is a mass within the deep posterior left breast measuring 29 x 30 mm (image 6).  There are enlarged round abnormal lymph nodes in the left axilla measuring up to 20 mm short axis (image 22).  Lymph nodes extend beneath the sub pectoralis muscles on the left (image 12).  There is no evidence of internal mammary lymphadenopathy.  No infraclavicular adenopathy.  No mediastinal adenopathy.  No pericardial fluid.  No suspicious pulmonary nodules.  IMPRESSION:  1.  Left breast mass consistent with breast carcinoma. 2.  Skin thickening over the left breast is concerning for inflammatory breast cancer. 3.  Nodal metastasis in the left axilla and sub pectoralis nodal stations. 4.  No evidence of central nodal metastasis or mediastinal metastasis  CT ABDOMEN AND PELVIS  Findings:  No focal hepatic lesion.  The gallbladder, pancreas, spleen, adrenal gland is, and kidneys are normal.  The stomach, small bowel, appendix, cecum are normal.  The colon and rectosigmoid colon are normal.  Abdominal aorta is normal caliber.  No retroperitoneal or periportal lymphadenopathy.  No mesenteric or peritoneal disease.  No free fluid in  the pelvis.  Post hysterectomy anatomy.  No  pelvic lymphadenopathy. Review of  bone windows demonstrates no aggressive osseous lesions.  IMPRESSION:  No evidence of metastasis in the abdomen or pelvis.   Original Report Authenticated By: Genevive Bi, M.D.    Ct Abdomen Pelvis W Contrast  10/27/2012  *RADIOLOGY REPORT*  Clinical Data:  High risk breast cancer staging.  Right  breast cancer diagnosed 54.  Left breast cancer diagnosed February 2014.  CT CHEST, ABDOMEN AND PELVIS WITH CONTRAST  Technique:  Multidetector CT imaging of the chest, abdomen and pelvis was performed following the standard protocol during bolus administration of intravenous contrast.  Contrast: OMNIPAQUE IOHEXOL 300 MG/ML  SOLN  Comparison:  PET CT scan 03/26 1014  CT CHEST  Findings:  Right breast mastectomy anatomy.  There is thickening of the skin of the left breast to 11 mm.  There is a mass within the deep posterior left breast measuring 29 x 30 mm (image 6).  There are enlarged round abnormal lymph nodes in the left axilla measuring up to 20 mm short axis (image 22).  Lymph nodes extend beneath the sub pectoralis muscles on the left (image 12).  There is no evidence of internal mammary lymphadenopathy.  No infraclavicular adenopathy.  No mediastinal adenopathy.  No pericardial fluid.  No suspicious pulmonary nodules.  IMPRESSION:  1.  Left breast mass consistent with breast carcinoma. 2.  Skin thickening over the left breast is concerning for inflammatory breast cancer. 3.  Nodal metastasis in the left axilla and sub pectoralis nodal stations. 4.  No evidence of central nodal metastasis or mediastinal metastasis  CT ABDOMEN AND PELVIS  Findings:  No focal hepatic lesion.  The gallbladder, pancreas, spleen, adrenal gland is, and kidneys are normal.  The stomach, small bowel, appendix, cecum are normal.  The colon and rectosigmoid colon are normal.  Abdominal aorta is normal caliber.  No retroperitoneal or periportal lymphadenopathy.  No mesenteric or peritoneal  disease.  No free fluid in  the pelvis.  Post hysterectomy anatomy.  No pelvic lymphadenopathy. Review of  bone windows demonstrates no aggressive osseous lesions.  IMPRESSION:  No evidence of metastasis in the abdomen or pelvis.   Original Report Authenticated By: Genevive Bi, M.D.    Mr Breast Bilateral W Wo Contrast  10/12/2012  *RADIOLOGY REPORT*  Clinical Data: History of previous malignant mastectomy of the right breast 25 years ago.  The patient developed diffuse skin thickening and firmness within the left breast.  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  BUN and creatinine were obtained on site at [facility] at [address] Results:  BUN 7 mg/dL,  Creatinine 1.0 mg/dL.  BILATERAL BREAST MRI WITH AND WITHOUT CONTRAST  Technique: Multiplanar, multisequence MR images of both breasts were obtained prior to and following the intravenous administration of 20ml of Multihance.  Three dimensional images were evaluated at the independent DynaCad workstation.  Comparison:  Mammograms dated 09/30/2012 and 09/28/2012.  Findings: There has been a previous right mastectomy.  There is marked diffuse enhancement involving the majority of the left breast.  This measures 13.0 x 12.5 x 9.5 cm in size.  This is associated with a mixture of plateau and washout enhancement kinetics.  In addition,  there is diffuse skin thickening and dermal enhancement.  There are multiple enlarged level I left axillary lymph nodes.  The largest lymph node measures 2.4 cm in size.  The large area of enhancement within the left breast extends posteriorly to abut the anterior portion of the pectoral muscle. There is no evidence for internal mammary adenopathy or right axillary adenopathy.  There are no additional findings.  IMPRESSION:  1.  Large ( 13 cm) area of enhancement involving the majority of  the left breast consistent  with the patient's known invasive ductal carcinoma and DCIS. This extends posteriorly to abut the pectoralis muscle. 2.  Multiple enlarged level I left axillary lymph nodes. 3.  Previous right mastectomy.  RECOMMENDATION: Treatment plan  THREE-DIMENSIONAL MR IMAGE RENDERING ON INDEPENDENT WORKSTATION:  Three-dimensional MR images were rendered by post-processing of the original MR data on an independent workstation.  The three- dimensional MR images were interpreted, and findings were reported in the accompanying complete MRI report for this study.  BI-RADS CATEGORY 6:  Known biopsy-proven malignancy - appropriate action should be taken.   Original Report Authenticated By: Rolla Plate, M.D.    Nm Pet Image Initial (pi) Skull Base To Thigh  10/27/2012  *RADIOLOGY REPORT*  Clinical Data: Initial treatment strategy for high risk of breast cancer. Triple negative  NUCLEAR MEDICINE PET SKULL BASE TO THIGH  Fasting Blood Glucose:  97  Technique:  17.9 mCi F-18 FDG was injected intravenously. CT data was obtained and used for attenuation correction and anatomic localization only.  (This was not acquired as a diagnostic CT examination.) Additional exam technical data entered on technologist worksheet.  Comparison:  Findings:  Neck: No hypermetabolic lymph nodes in the neck.  Chest:  There is intense metabolic activity ( SUV max = 16.1) associated with a large portion of the glandular tissue of the left breast consistent with carcinoma.  There are intensely hypermetabolic left axillary lymph nodes with SUV max = 15.1.  These nodes are rounded and measure up to 2 cm. Hypermetabolic nodes extend to the sub pectoralis nodal station (image 71).  There is a single hypermetabolic internal mammary lymph node measuring 5 mm just left of the sternum (image 87).  This has intense metabolic activity for size with SUV max = 7.1.  No hypermetabolic mediastinal lymph nodes.  No hypermetabolic pulmonary  nodules.  Abdomen/Pelvis:  No abnormal hypermetabolic activity within the liver, pancreas, adrenal glands, or spleen.  No hypermetabolic lymph nodes in the abdomen or pelvis.  Skeleton:  No focal hypermetabolic activity to suggest skeletal metastasis.  IMPRESSION:  1.  Hypermetabolic breast tissue on the left consistent with primary carcinoma. 2.  Hypermetabolic nodal metastasis in the left axilla extending to the sub pectoralis nodes. 3.  Single hypermetabolic left internal mammary lymph node.  4.  No evidence of pulmonary metastasis.  5.  No evidence of metastasis beneath hemidiaphragms or within the skeleton.   Original Report Authenticated By: Genevive Bi, M.D.    Dg Chest Port 1 View  10/28/2012  *RADIOLOGY REPORT*  Clinical Data: Port-A-Cath placement.  PORTABLE CHEST - 1 VIEW  Comparison: None.  Findings: The patient is rotated on the study.  Right side Port-A- Cath is in place.  Tubing appears intact.  Tip of the catheter projects in the mid to lower superior vena cava.  No pneumothorax identified.  Heart size is normal.  Lungs are clear.  The patient is status post right mastectomy and axillary dissection.  IMPRESSION: Tip of Port-A-Cath projects over the mid to lower superior vena cava.  The patient is rotated on the study.  Exact position of the tip of the catheter could be determined with the lateral film. No pneumothorax.   Original Report Authenticated By: Holley Dexter, M.D.    Dg Fluoro Guide Cv Line-no Report  10/28/2012  CLINICAL DATA: [facility]   FLOURO GUIDE CV LINE  Fluoroscopy was utilized by the requesting physician.  No radiographic  interpretation.      ASSESSMENT:   57 year old female with  #  1 history of breast cancer of the right breast status post mastectomy when she was 57 years old. Due to this she was referred for genetic testing.   #2 patient now with left invasive ductal carcinoma with ductal carcinoma in situ measuring 13.0 x 12.5 x 9.5 cm on MRI with positive  lymph nodes. Patient's tumor is ER negative PR negative HER-2/neu negative with a Ki-67 of 21%. Patient is being seen in medical oncology for neoadjuvant chemotherapy. Patient and I discussed her pathology in detail. We discussed treatment for breast cancer including surgical and with chemotherapy. Patient understands that she may still need a mastectomy however by giving her neoadjuvant chemotherapy her surgery may be made a little bit easier hopefully with giving her negative margins. She understands that neoadjuvant chemotherapy will also treat her distant disease.I discussed type of chemotherapy that she should have. This would include dose dense FEC x6 cycles followed by weekly Taxol carboplatinum for 12 weeks.  Currently due to receive Taxol carbo week 6.    #3 Patient underwent staging studies with PET/CT, and received echo, port placement and chemotherapy class.    #4 Acute paronychia  #5 Hypokalemia  PLAN:   1. Doing well today.  We will hold Taxol/Carbo today due to thrombocytopenia.  For the neutropenia she will receive Neupogen daily x 4.    2. I will prescribe Keflex QID for the toe infection.  I referred her to podiatry to evaluate the acute paronychia of her toenail.    3. She will continue her potassium.  We will continue to monitor it.    4. She will return in 1 week for labs and evaluation for cycle 6 of treatment.    All questions were answered. The patient knows to call the clinic with any problems, questions or concerns. We can certainly see the patient much sooner if necessary.  I spent 25 minutes counseling the patient face to face. The total time spent in the appointment was 30 minutes.  Cherie Ouch Lyn Hollingshead, NP Medical Oncology [facility] Phone: [phone] 03/17/2013, 12:17 AM

## 2013-03-15 NOTE — Telephone Encounter (Signed)
appts made and printed. Pt will attend her podiatry 03/21/13 @ 2pm...td

## 2013-03-16 ENCOUNTER — Ambulatory Visit (HOSPITAL_BASED_OUTPATIENT_CLINIC_OR_DEPARTMENT_OTHER): Payer: BC Managed Care – PPO

## 2013-03-16 VITALS — BP 130/76 | HR 113 | Temp 99.8°F

## 2013-03-16 DIAGNOSIS — C773 Secondary and unspecified malignant neoplasm of axilla and upper limb lymph nodes: Secondary | ICD-10-CM

## 2013-03-16 DIAGNOSIS — C50912 Malignant neoplasm of unspecified site of left female breast: Secondary | ICD-10-CM

## 2013-03-16 DIAGNOSIS — Z5189 Encounter for other specified aftercare: Secondary | ICD-10-CM

## 2013-03-16 DIAGNOSIS — C50919 Malignant neoplasm of unspecified site of unspecified female breast: Secondary | ICD-10-CM

## 2013-03-16 MED ORDER — FILGRASTIM 480 MCG/0.8ML IJ SOLN
480.0000 ug | Freq: Once | INTRAMUSCULAR | Status: AC
  Administered 2013-03-16: 480 ug via SUBCUTANEOUS
  Filled 2013-03-16: qty 0.8

## 2013-03-17 ENCOUNTER — Encounter: Payer: Self-pay | Admitting: Adult Health

## 2013-03-17 ENCOUNTER — Ambulatory Visit (HOSPITAL_BASED_OUTPATIENT_CLINIC_OR_DEPARTMENT_OTHER): Payer: BC Managed Care – PPO

## 2013-03-17 VITALS — BP 108/43 | HR 112 | Temp 98.4°F

## 2013-03-17 DIAGNOSIS — C50912 Malignant neoplasm of unspecified site of left female breast: Secondary | ICD-10-CM

## 2013-03-17 DIAGNOSIS — C50919 Malignant neoplasm of unspecified site of unspecified female breast: Secondary | ICD-10-CM

## 2013-03-17 DIAGNOSIS — Z5189 Encounter for other specified aftercare: Secondary | ICD-10-CM

## 2013-03-17 DIAGNOSIS — C773 Secondary and unspecified malignant neoplasm of axilla and upper limb lymph nodes: Secondary | ICD-10-CM

## 2013-03-17 MED ORDER — FILGRASTIM 480 MCG/0.8ML IJ SOLN
480.0000 ug | Freq: Once | INTRAMUSCULAR | Status: AC
  Administered 2013-03-17: 480 ug via SUBCUTANEOUS
  Filled 2013-03-17: qty 0.8

## 2013-03-18 ENCOUNTER — Ambulatory Visit (HOSPITAL_BASED_OUTPATIENT_CLINIC_OR_DEPARTMENT_OTHER): Payer: BC Managed Care – PPO

## 2013-03-18 VITALS — BP 145/83 | HR 99 | Temp 99.0°F | Resp 16

## 2013-03-18 DIAGNOSIS — C50912 Malignant neoplasm of unspecified site of left female breast: Secondary | ICD-10-CM

## 2013-03-18 DIAGNOSIS — C50919 Malignant neoplasm of unspecified site of unspecified female breast: Secondary | ICD-10-CM

## 2013-03-18 DIAGNOSIS — Z5189 Encounter for other specified aftercare: Secondary | ICD-10-CM

## 2013-03-18 DIAGNOSIS — C773 Secondary and unspecified malignant neoplasm of axilla and upper limb lymph nodes: Secondary | ICD-10-CM

## 2013-03-18 MED ORDER — FILGRASTIM 480 MCG/0.8ML IJ SOLN
480.0000 ug | Freq: Once | INTRAMUSCULAR | Status: AC
  Administered 2013-03-18: 480 ug via SUBCUTANEOUS
  Filled 2013-03-18: qty 0.8

## 2013-03-22 ENCOUNTER — Other Ambulatory Visit (HOSPITAL_BASED_OUTPATIENT_CLINIC_OR_DEPARTMENT_OTHER): Payer: BC Managed Care – PPO | Admitting: Lab

## 2013-03-22 ENCOUNTER — Ambulatory Visit: Payer: BC Managed Care – PPO

## 2013-03-22 ENCOUNTER — Encounter: Payer: Self-pay | Admitting: Adult Health

## 2013-03-22 ENCOUNTER — Ambulatory Visit (HOSPITAL_BASED_OUTPATIENT_CLINIC_OR_DEPARTMENT_OTHER): Payer: BC Managed Care – PPO | Admitting: Adult Health

## 2013-03-22 VITALS — BP 127/82 | HR 106 | Temp 98.8°F | Resp 20 | Ht 64.0 in | Wt 208.2 lb

## 2013-03-22 DIAGNOSIS — C50919 Malignant neoplasm of unspecified site of unspecified female breast: Secondary | ICD-10-CM

## 2013-03-22 DIAGNOSIS — Z853 Personal history of malignant neoplasm of breast: Secondary | ICD-10-CM

## 2013-03-22 DIAGNOSIS — C50912 Malignant neoplasm of unspecified site of left female breast: Secondary | ICD-10-CM

## 2013-03-22 DIAGNOSIS — D696 Thrombocytopenia, unspecified: Secondary | ICD-10-CM

## 2013-03-22 DIAGNOSIS — Z171 Estrogen receptor negative status [ER-]: Secondary | ICD-10-CM

## 2013-03-22 DIAGNOSIS — E876 Hypokalemia: Secondary | ICD-10-CM

## 2013-03-22 DIAGNOSIS — C773 Secondary and unspecified malignant neoplasm of axilla and upper limb lymph nodes: Secondary | ICD-10-CM

## 2013-03-22 LAB — CBC WITH DIFFERENTIAL/PLATELET
Basophils Absolute: 0 10*3/uL (ref 0.0–0.1)
EOS%: 0.2 % (ref 0.0–7.0)
Eosinophils Absolute: 0 10*3/uL (ref 0.0–0.5)
HGB: 10.3 g/dL — ABNORMAL LOW (ref 11.6–15.9)
LYMPH%: 40.2 % (ref 14.0–49.7)
MCH: 30.9 pg (ref 25.1–34.0)
MCV: 93.4 fL (ref 79.5–101.0)
MONO%: 13.7 % (ref 0.0–14.0)
Platelets: 80 10*3/uL — ABNORMAL LOW (ref 145–400)
RBC: 3.33 10*6/uL — ABNORMAL LOW (ref 3.70–5.45)
RDW: 17.1 % — ABNORMAL HIGH (ref 11.2–14.5)

## 2013-03-22 NOTE — Progress Notes (Signed)
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   PRIOR THERAPY:  1. Patient has history of right breast cancer patient underwent a mastectomy when she was 57 years old. This occurred during her pregnancy. After that patient did not take any kind of further treatments. She continued to do well.   2.Recently she noted changes in her nipple had become itchy and retracted. Because of this she underwent mammogram with ultrasound that showed 2 areas in the left breast one at 12:00 position and a second at 3:00 position. She also had an ultrasound performed that showed the diameter to be at least 6 cm and multiple left axillary lymph nodes that were abnormal. She then went on to have a biopsy performed on 09/30/2012. 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 Patient went on to have MRIs of the breasts performed and was noted to be marked diffuse enhancement involving majority of the left breast measuring 13.0 x 12.5 x 9.5 cm. There was diffuse skin thickening and dermal enhancement. Multiple enlarged level I left axillary lymph nodes. The largest lymph node measured 2.4 cm in size. The large area of enhancement within the left breast extends posteriorly to abut the anterior portion of the pectoralis muscle. There was no evidence of internal mammary adenopathy or right axillary adenopathy.  PET/CT did show the left breast mass, and left axillary and subpectoralis nodal metastases, but no further areas throughout the chest/abd/pelvis of metastases.   3.  Patient currently undergoing  neoadjuvant chemotherapy with FEC x 6 cycles starting on 11/02/12, which will be followed by 12 cycles of weekly Taxol/Carbo that started on 02/08/13.  CURRENT THERAPY: Taxol Carbo week 6  INTERVAL HISTORY: Christine [name] 57 y.o. female returns for follow up today prior to chemotherapy.  Her blood pressure has improved since starting Losartan last week.  She also took the Keflex QID and went to the podiatrist.  Within 2 days of starting the antibiotics the toenail exudate and odor stopped.  The podiatrist clipped her toenail and cleaned it.  She denies fevers, chills, nausea, vomiting, constipation, diarrhea, or any other concerns.  A 10 point ROS is negative.   MEDICAL HISTORY: Past Medical History  Diagnosis Date  . ALLERGIC RHINITIS   . GERD (gastroesophageal reflux disease)   . Hypertension   . Hypothyroidism   . Hx of colonic polyps   . Wears glasses   . Breast cancer 17, age 79    right  . Breast cancer 09/30/12    left, ER/PR -, Her 2 -    ALLERGIES:  is allergic to sulfa antibiotics and sulfonamide derivatives.  MEDICATIONS:  Current Outpatient Prescriptions  Medication Sig Dispense Refill  . cephALEXin (KEFLEX) 500 MG capsule Take 1 capsule (500 mg total) by mouth 4 (four) times daily.  40 capsule  0  . dexamethasone (DECADRON) 4 MG tablet TAKE 2 TABS PO QD ON DAY AFTER CHEMO THEN 2 TABS BID X 2 DAYS  30 tablet  1  . diltiazem (CARDIZEM CD) 240 MG 24 hr capsule TAKE ONE CAPSULE BY MOUTH EVERY DAY  90 capsule  3  . doxycycline (VIBRA-TABS) 100 MG tablet Take 1 tablet (100 mg total) by mouth 2 (two) times daily.  28 tablet  2  . fluconazole (DIFLUCAN) 200 MG tablet Take 1 tablet (200 mg total) by mouth every other day.  10 tablet  2  . gabapentin (NEURONTIN) 100 MG capsule Take 1 capsule (100 mg total) by mouth 3 (three) times daily.  90 capsule  2  . latanoprost (XALATAN) 0.005 % ophthalmic solution Place 1 drop into both eyes at bedtime.       Marland Kitchen levothyroxine (SYNTHROID,  LEVOTHROID) 50 MCG tablet TAKE 1 TABLET (50 MCG TOTAL) BY MOUTH DAILY.  90 tablet  2  . lidocaine-prilocaine (EMLA) cream Apply topically as needed.  30 g  6  . LORazepam (ATIVAN) 0.5 MG tablet Take 1 tablet (0.5 mg total) by mouth every 6 (six) hours as needed (Nausea or vomiting).  30 tablet  0  . losartan-hydrochlorothiazide (HYZAAR) 50-12.5 MG per tablet Take 1 tablet by mouth daily.  30 tablet  3  . NEXIUM 40 MG capsule TAKE 1 CAPSULE (40 MG TOTAL) BY MOUTH DAILY BEFORE BREAKFAST.  90 capsule  1  . nystatin (MYCOSTATIN) 100000 UNIT/ML suspension Take 5 mLs (500,000 Units total) by mouth 4 (four) times daily.  240 mL  1  . potassium chloride SA (K-DUR,KLOR-CON) 20 MEQ tablet Take 2 tablets (40 mEq total) by mouth 2 (two) times daily.  120 tablet  0   No current facility-administered medications for this visit.    SURGICAL HISTORY:  Past Surgical History  Procedure Laterality Date  . Tonsillectomy    . Caesarean section      x 1  . Mastectomy  04/1987    right side  . Abdominal hysterectomy  06/1988    fibroids  . Portacath placement Right 10/28/2012    Procedure: PORT PLACEMENT;  Surgeon: Clovis Pu. Cornett, MD;  Location: [facility];  Service: General;  Laterality: Right;  Right Subclavian Vein    REVIEW OF SYSTEMS:  General: fatigue (+), night sweats (-), fever (-), pain (-) Lymph: palpable nodes (-) HEENT: vision changes (-), mucositis (-), gum bleeding (-), epistaxis (-) Cardiovascular: chest pain (-), palpitations (-) Pulmonary: shortness of breath (-), dyspnea on exertion (-), cough (-), hemoptysis (-) GI:  Early satiety (-), melena (-), dysphagia (-), nausea/vomiting (-), diarrhea (-) GU: dysuria (-), hematuria (-), incontinence (-) Musculoskeletal: joint swelling (-), joint pain (-), back pain (-) Neuro: weakness (-), numbness (-), headache (-), confusion (-) Skin: Rash (-), lesions (-), dryness (-) Psych: depression (-), suicidal/homicidal ideation (-),  feeling of hopelessness (-)   PHYSICAL EXAMINATION: Blood pressure 127/82, pulse 106, temperature 98.8 F (37.1 C), temperature source Oral, resp. rate 20, height 5\' 4"  (1.626 m), weight 208 lb 3.2 oz (94.439 kg). Body mass index is 35.72 kg/(m^2). General: Patient is a well appearing female in no acute distress HEENT: PERRLA, sclerae anicteric no conjunctival pallor, MMM. Ulcer on left lateral tongue and right buccal mucosa Neck: supple, no palpable adenopathy Lungs: clear to auscultation bilaterally, no wheezes, rhonchi, or rales Cardiovascular: regular rate rhythm, S1, S2, no murmurs, rubs or gallops Abdomen: Soft, non-tender, non-distended, normoactive bowel sounds, no HSM Extremities: warm and well perfused, no clubbing, cyanosis, or edema Skin: No rashes or lesions Neuro: Non-focal Breasts: Right mastectomy site without nodularity or sign of recurrence.  Left breast mass palpable along with dimpling of skin and inversion of the nipple--much softer and smaller.  Right chest wall  port now closed ECOG PERFORMANCE STATUS: 1 - Symptomatic but completely ambulatory  LABORATORY DATA: Lab Results  Component Value Date   WBC 6.3 03/22/2013   HGB 10.3* 03/22/2013   HCT 31.1* 03/22/2013   MCV 93.4 03/22/2013   PLT 80* 03/22/2013      Chemistry      Component Value Date/Time   NA 141 03/08/2013 1346   NA 144 10/28/2012 1248   K 3.5 03/08/2013 1346   K 3.9 10/28/2012 1248   CL 101 01/25/2013 0952   CL 108 10/28/2012 1248   CO2 26 03/08/2013 1346   CO2 29 09/13/2011 1910   BUN 10.7 03/08/2013 1346   BUN 10 10/28/2012 1248   CREATININE 0.8 03/08/2013 1346   CREATININE 0.80 10/28/2012 1248      Component Value Date/Time   CALCIUM 9.9 03/08/2013 1346   CALCIUM 9.4 09/13/2011 1910   ALKPHOS 83 03/08/2013 1346   ALKPHOS 78 09/13/2011 1910   AST 29 03/08/2013 1346   AST 18 09/13/2011 1910   ALT 27 03/08/2013 1346   ALT 12 09/13/2011 1910   BILITOT 0.40 03/08/2013 1346   BILITOT 0.3 09/13/2011 1910        RADIOGRAPHIC STUDIES:  Ct Chest W Contrast  10/27/2012  *RADIOLOGY REPORT*  Clinical Data:  High risk breast cancer staging.  Right breast cancer diagnosed 1988.  Left breast cancer diagnosed February 2014.  CT CHEST, ABDOMEN AND PELVIS WITH CONTRAST  Technique:  Multidetector CT imaging of the chest, abdomen and pelvis was performed following the standard protocol during bolus administration of intravenous contrast.  Contrast: OMNIPAQUE IOHEXOL 300 MG/ML  SOLN  Comparison:  PET CT scan 03/26 1014  CT CHEST  Findings:  Right breast mastectomy anatomy.  There is thickening of the skin of the left breast to 11 mm.  There is a mass within the deep posterior left breast measuring 29 x 30 mm (image 6).  There are enlarged round abnormal lymph nodes in the left axilla measuring up to 20 mm short axis (image 22).  Lymph nodes extend beneath the sub pectoralis muscles on the left (image 12).  There is no evidence of internal mammary lymphadenopathy.  No infraclavicular adenopathy.  No mediastinal adenopathy.  No pericardial fluid.  No suspicious pulmonary nodules.  IMPRESSION:  1.  Left breast mass consistent with breast carcinoma. 2.  Skin thickening over the left breast is concerning for inflammatory breast cancer. 3.  Nodal metastasis in the left axilla and sub pectoralis nodal stations. 4.  No evidence of central nodal metastasis or mediastinal metastasis  CT ABDOMEN AND PELVIS  Findings:  No focal hepatic lesion.  The gallbladder, pancreas, spleen, adrenal gland is, and kidneys are normal.  The stomach, small bowel, appendix, cecum are normal.  The colon and rectosigmoid colon are normal.  Abdominal aorta is normal caliber.  No retroperitoneal or periportal lymphadenopathy.  No mesenteric or peritoneal disease.  No free fluid in  the pelvis.  Post hysterectomy anatomy.  No pelvic lymphadenopathy. Review of  bone windows demonstrates no aggressive osseous lesions.  IMPRESSION:  No evidence of metastasis  in the abdomen or pelvis.   Original Report Authenticated By: Genevive Bi, M.D.    Ct Abdomen Pelvis W Contrast  10/27/2012  *RADIOLOGY REPORT*  Clinical Data:  High risk breast cancer staging.  Right breast cancer diagnosed 1988.  Left breast cancer diagnosed February 2014.  CT CHEST, ABDOMEN AND PELVIS WITH CONTRAST  Technique:  Multidetector CT imaging of  the chest, abdomen and pelvis was performed following the standard protocol during bolus administration of intravenous contrast.  Contrast: OMNIPAQUE IOHEXOL 300 MG/ML  SOLN  Comparison:  PET CT scan 03/26 1014  CT CHEST  Findings:  Right breast mastectomy anatomy.  There is thickening of the skin of the left breast to 11 mm.  There is a mass within the deep posterior left breast measuring 29 x 30 mm (image 6).  There are enlarged round abnormal lymph nodes in the left axilla measuring up to 20 mm short axis (image 22).  Lymph nodes extend beneath the sub pectoralis muscles on the left (image 12).  There is no evidence of internal mammary lymphadenopathy.  No infraclavicular adenopathy.  No mediastinal adenopathy.  No pericardial fluid.  No suspicious pulmonary nodules.  IMPRESSION:  1.  Left breast mass consistent with breast carcinoma. 2.  Skin thickening over the left breast is concerning for inflammatory breast cancer. 3.  Nodal metastasis in the left axilla and sub pectoralis nodal stations. 4.  No evidence of central nodal metastasis or mediastinal metastasis  CT ABDOMEN AND PELVIS  Findings:  No focal hepatic lesion.  The gallbladder, pancreas, spleen, adrenal gland is, and kidneys are normal.  The stomach, small bowel, appendix, cecum are normal.  The colon and rectosigmoid colon are normal.  Abdominal aorta is normal caliber.  No retroperitoneal or periportal lymphadenopathy.  No mesenteric or peritoneal disease.  No free fluid in  the pelvis.  Post hysterectomy anatomy.  No pelvic lymphadenopathy. Review of  bone windows demonstrates no  aggressive osseous lesions.  IMPRESSION:  No evidence of metastasis in the abdomen or pelvis.   Original Report Authenticated By: Genevive Bi, M.D.    Mr Breast Bilateral W Wo Contrast  10/12/2012  *RADIOLOGY REPORT*  Clinical Data: History of previous malignant mastectomy of the right breast 25 years ago.  The patient developed diffuse skin thickening and firmness within the left breast.  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  BUN and creatinine were obtained on site at [facility] at [address] Results:  BUN 7 mg/dL,  Creatinine 1.0 mg/dL.  BILATERAL BREAST MRI WITH AND WITHOUT CONTRAST  Technique: Multiplanar, multisequence MR images of both breasts were obtained prior to and following the intravenous administration of 20ml of Multihance.  Three dimensional images were evaluated at the independent DynaCad workstation.  Comparison:  Mammograms dated 09/30/2012 and 09/28/2012.  Findings: There has been a previous right mastectomy.  There is marked diffuse enhancement involving the majority of the left breast.  This measures 13.0 x 12.5 x 9.5 cm in size.  This is associated with a mixture of plateau and washout enhancement kinetics.  In addition,  there is diffuse skin thickening and dermal enhancement.  There are multiple enlarged level I left axillary lymph nodes.  The largest lymph node measures 2.4 cm in size.  The large area of enhancement within the left breast extends posteriorly to abut the anterior portion of the pectoral muscle. There is no evidence for internal mammary adenopathy or right axillary adenopathy.  There are no additional findings.  IMPRESSION:  1.  Large ( 13 cm) area of enhancement involving the majority of the left breast consistent with the patient's known invasive ductal carcinoma and DCIS. This extends posteriorly to abut the pectoralis  muscle. 2.  Multiple enlarged level I left axillary lymph  nodes. 3.  Previous right mastectomy.  RECOMMENDATION: Treatment plan  THREE-DIMENSIONAL MR IMAGE RENDERING ON INDEPENDENT WORKSTATION:  Three-dimensional MR images were rendered by post-processing of the original MR data on an independent workstation.  The three- dimensional MR images were interpreted, and findings were reported in the accompanying complete MRI report for this study.  BI-RADS CATEGORY 6:  Known biopsy-proven malignancy - appropriate action should be taken.   Original Report Authenticated By: Rolla Plate, M.D.    Nm Pet Image Initial (pi) Skull Base To Thigh  10/27/2012  *RADIOLOGY REPORT*  Clinical Data: Initial treatment strategy for high risk of breast cancer. Triple negative  NUCLEAR MEDICINE PET SKULL BASE TO THIGH  Fasting Blood Glucose:  97  Technique:  17.9 mCi F-18 FDG was injected intravenously. CT data was obtained and used for attenuation correction and anatomic localization only.  (This was not acquired as a diagnostic CT examination.) Additional exam technical data entered on technologist worksheet.  Comparison:  Findings:  Neck: No hypermetabolic lymph nodes in the neck.  Chest:  There is intense metabolic activity ( SUV max = 40.9) associated with a large portion of the glandular tissue of the left breast consistent with carcinoma.  There are intensely hypermetabolic left axillary lymph nodes with SUV max = 15.1.  These nodes are rounded and measure up to 2 cm. Hypermetabolic nodes extend to the sub pectoralis nodal station (image 71).  There is a single hypermetabolic internal mammary lymph node measuring 5 mm just left of the sternum (image 87).  This has intense metabolic activity for size with SUV max = 7.1.  No hypermetabolic mediastinal lymph nodes.  No hypermetabolic pulmonary nodules.  Abdomen/Pelvis:  No abnormal hypermetabolic activity within the liver, pancreas, adrenal glands, or spleen.  No hypermetabolic  lymph nodes in the abdomen or pelvis.  Skeleton:  No focal hypermetabolic activity to suggest skeletal metastasis.  IMPRESSION:  1.  Hypermetabolic breast tissue on the left consistent with primary carcinoma. 2.  Hypermetabolic nodal metastasis in the left axilla extending to the sub pectoralis nodes. 3.  Single hypermetabolic left internal mammary lymph node.  4.  No evidence of pulmonary metastasis.  5.  No evidence of metastasis beneath hemidiaphragms or within the skeleton.   Original Report Authenticated By: Genevive Bi, M.D.    Dg Chest Port 1 View  10/28/2012  *RADIOLOGY REPORT*  Clinical Data: Port-A-Cath placement.  PORTABLE CHEST - 1 VIEW  Comparison: None.  Findings: The patient is rotated on the study.  Right side Port-A- Cath is in place.  Tubing appears intact.  Tip of the catheter projects in the mid to lower superior vena cava.  No pneumothorax identified.  Heart size is normal.  Lungs are clear.  The patient is status post right mastectomy and axillary dissection.  IMPRESSION: Tip of Port-A-Cath projects over the mid to lower superior vena cava.  The patient is rotated on the study.  Exact position of the tip of the catheter could be determined with the lateral film. No pneumothorax.   Original Report Authenticated By: Holley Dexter, M.D.    Dg Fluoro Guide Cv Line-no Report  10/28/2012  CLINICAL DATA: [facility]   FLOURO GUIDE CV LINE  Fluoroscopy was utilized by the requesting physician.  No radiographic  interpretation.      ASSESSMENT:   57 year old female with  #1 history of breast cancer of the right breast status post mastectomy when she was 57 years old. Due to this she was referred for genetic  testing.   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 Patient's tumor is ER negative PR negative HER-2/neu negative with a Ki-67 of 21%. Patient is being seen in medical oncology  for neoadjuvant chemotherapy. Patient and I discussed her pathology in detail. We discussed treatment for breast cancer including surgical and with chemotherapy. Patient understands that she may still need a mastectomy however by giving her neoadjuvant chemotherapy her surgery may be made a little bit easier hopefully with giving her negative margins. She understands that neoadjuvant chemotherapy will also treat her distant disease.I discussed type of chemotherapy that she should have. This would include dose dense FEC x6 cycles followed by weekly Taxol carboplatinum for 12 weeks.  Currently due to receive Taxol carbo week 6.    #3 Patient underwent staging studies with PET/CT, and received echo, port placement and chemotherapy class.    #4 Acute paronychia  #5 Hypokalemia  PLAN:   1. Doing well today.  We will unfortunately again hold Taxol/Carbo today due to thrombocytopenia.  Her neutropenia resolved after receiving Neupogen.     2. Patient's paronychia is resolved.  She will stop taking the keflex.     3. She will continue her potassium.  We will continue to monitor it.    4. She will return in 1 week for labs and evaluation for cycle 6 of treatment.    All questions were answered. The patient knows to call the clinic with any problems, questions or concerns. We can certainly see the patient much sooner if necessary.  I spent 25 minutes counseling the patient face to face. The total time spent in the appointment was 30 minutes.  Cherie Ouch Lyn Hollingshead, NP Medical Oncology [facility] Phone: [phone] 03/22/2013, 2:06 PM

## 2013-03-23 ENCOUNTER — Telehealth: Payer: Self-pay | Admitting: Internal Medicine

## 2013-03-23 NOTE — Telephone Encounter (Signed)
Caller: [name]/Patient; Phone: [phone]; Reason for Call: Patient calls at the request of Everlina Gotts to let her know how the addition of new blood pressure medication (Losartan-HCTZ) is doing for her.  She shares that with its addition the highest her blood pressure has been was 143/80.  Her usual average now is around 120/80.  Patient is pleased and is feeling well.

## 2013-03-24 ENCOUNTER — Other Ambulatory Visit: Payer: Self-pay | Admitting: *Deleted

## 2013-03-24 ENCOUNTER — Telehealth: Payer: Self-pay | Admitting: *Deleted

## 2013-03-24 DIAGNOSIS — G629 Polyneuropathy, unspecified: Secondary | ICD-10-CM

## 2013-03-24 MED ORDER — GABAPENTIN 100 MG PO CAPS: ORAL_CAPSULE | ORAL | Status: DC

## 2013-03-24 NOTE — Telephone Encounter (Signed)
INFORMED PT. OF MEDICATION DOSE CHANGE AND NEW INSTRUCTIONS CALL TO HER PHARMACY. SHE VOICES UNDERSTANDING.

## 2013-03-24 NOTE — Telephone Encounter (Signed)
Verbal order received and read back from Augustin Schooling NP for patient to increase her gabapentin.  Order not given to patient at this time.  Called mobile and home number leaving message for a return call.  Refill sent to [facility].  Awaiting return call from patient.

## 2013-03-24 NOTE — Telephone Encounter (Signed)
Message retrieved from voice mail from patient reporting the numbness and tingling is getting worse despite taking gabapentin.  Gabapentin 100 mg ordered TID.  Last Taxol/CArboplatin received on 03-08-2013.  Next scheduled f/u and treatment scheduled 03-29-2013.  Left message at 10:38 am requesting return call.  Awaiting call from patient.

## 2013-03-29 ENCOUNTER — Encounter: Payer: Self-pay | Admitting: Adult Health

## 2013-03-29 ENCOUNTER — Other Ambulatory Visit: Payer: Self-pay | Admitting: Emergency Medicine

## 2013-03-29 ENCOUNTER — Other Ambulatory Visit (HOSPITAL_BASED_OUTPATIENT_CLINIC_OR_DEPARTMENT_OTHER): Payer: BC Managed Care – PPO | Admitting: Lab

## 2013-03-29 ENCOUNTER — Ambulatory Visit (HOSPITAL_BASED_OUTPATIENT_CLINIC_OR_DEPARTMENT_OTHER): Payer: BC Managed Care – PPO | Admitting: Adult Health

## 2013-03-29 ENCOUNTER — Ambulatory Visit (HOSPITAL_BASED_OUTPATIENT_CLINIC_OR_DEPARTMENT_OTHER): Payer: BC Managed Care – PPO

## 2013-03-29 VITALS — BP 125/82 | HR 109 | Temp 98.4°F | Resp 20 | Ht 64.0 in | Wt 213.6 lb

## 2013-03-29 DIAGNOSIS — C773 Secondary and unspecified malignant neoplasm of axilla and upper limb lymph nodes: Secondary | ICD-10-CM

## 2013-03-29 DIAGNOSIS — C50919 Malignant neoplasm of unspecified site of unspecified female breast: Secondary | ICD-10-CM

## 2013-03-29 DIAGNOSIS — E876 Hypokalemia: Secondary | ICD-10-CM

## 2013-03-29 DIAGNOSIS — Z5111 Encounter for antineoplastic chemotherapy: Secondary | ICD-10-CM

## 2013-03-29 DIAGNOSIS — C50912 Malignant neoplasm of unspecified site of left female breast: Secondary | ICD-10-CM

## 2013-03-29 LAB — CBC WITH DIFFERENTIAL/PLATELET
BASO%: 0.2 % (ref 0.0–2.0)
Basophils Absolute: 0 10*3/uL (ref 0.0–0.1)
EOS%: 0.3 % (ref 0.0–7.0)
HCT: 28.9 % — ABNORMAL LOW (ref 34.8–46.6)
HGB: 9.5 g/dL — ABNORMAL LOW (ref 11.6–15.9)
LYMPH%: 37.3 % (ref 14.0–49.7)
MCH: 31.3 pg (ref 25.1–34.0)
MCHC: 32.9 g/dL (ref 31.5–36.0)
MCV: 95.1 fL (ref 79.5–101.0)
NEUT%: 51.5 % (ref 38.4–76.8)
Platelets: 129 10*3/uL — ABNORMAL LOW (ref 145–400)

## 2013-03-29 LAB — COMPREHENSIVE METABOLIC PANEL (CC13)
AST: 24 U/L (ref 5–34)
Albumin: 3.4 g/dL — ABNORMAL LOW (ref 3.5–5.0)
Alkaline Phosphatase: 79 U/L (ref 40–150)
BUN: 11.3 mg/dL (ref 7.0–26.0)
Calcium: 8.7 mg/dL (ref 8.4–10.4)
Creatinine: 0.7 mg/dL (ref 0.6–1.1)
Glucose: 141 mg/dl — ABNORMAL HIGH (ref 70–140)

## 2013-03-29 MED ORDER — SODIUM CHLORIDE 0.9 % IJ SOLN
10.0000 mL | INTRAMUSCULAR | Status: DC | PRN
  Administered 2013-03-29: 10 mL
  Filled 2013-03-29: qty 10

## 2013-03-29 MED ORDER — DEXAMETHASONE SODIUM PHOSPHATE 20 MG/5ML IJ SOLN
20.0000 mg | Freq: Once | INTRAMUSCULAR | Status: AC
  Administered 2013-03-29: 20 mg via INTRAVENOUS

## 2013-03-29 MED ORDER — ONDANSETRON 16 MG/50ML IVPB (CHCC)
16.0000 mg | Freq: Once | INTRAVENOUS | Status: AC
  Administered 2013-03-29: 16 mg via INTRAVENOUS

## 2013-03-29 MED ORDER — SODIUM CHLORIDE 0.9 % IV SOLN
283.8000 mg | Freq: Once | INTRAVENOUS | Status: AC
  Administered 2013-03-29: 280 mg via INTRAVENOUS
  Filled 2013-03-29: qty 28

## 2013-03-29 MED ORDER — PACLITAXEL CHEMO INJECTION 300 MG/50ML
80.0000 mg/m2 | Freq: Once | INTRAVENOUS | Status: AC
  Administered 2013-03-29: 162 mg via INTRAVENOUS
  Filled 2013-03-29: qty 27

## 2013-03-29 MED ORDER — HEPARIN SOD (PORK) LOCK FLUSH 100 UNIT/ML IV SOLN
500.0000 [IU] | Freq: Once | INTRAVENOUS | Status: AC | PRN
  Administered 2013-03-29: 500 [IU]
  Filled 2013-03-29: qty 5

## 2013-03-29 MED ORDER — SODIUM CHLORIDE 0.9 % IV SOLN
Freq: Once | INTRAVENOUS | Status: AC
  Administered 2013-03-29: 15:00:00 via INTRAVENOUS

## 2013-03-29 MED ORDER — DIPHENHYDRAMINE HCL 50 MG/ML IJ SOLN
50.0000 mg | Freq: Once | INTRAMUSCULAR | Status: AC
  Administered 2013-03-29: 50 mg via INTRAVENOUS

## 2013-03-29 MED ORDER — FAMOTIDINE IN NACL 20-0.9 MG/50ML-% IV SOLN
20.0000 mg | Freq: Once | INTRAVENOUS | Status: AC
  Administered 2013-03-29: 20 mg via INTRAVENOUS

## 2013-03-29 NOTE — Patient Instructions (Signed)
Doing well.  Proceed with chemotherapy.  Please call us if you have any questions or concerns.    

## 2013-03-29 NOTE — Progress Notes (Signed)
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   PRIOR THERAPY:  1. Patient has history of right breast cancer patient underwent a mastectomy when she was 57 years old. This occurred during her pregnancy. After that patient did not take any kind of further treatments. She continued to do well.   2.Recently she noted changes in her nipple had become itchy and retracted. Because of this she underwent mammogram with ultrasound that showed 2 areas in the left breast one at 12:00 position and a second at 3:00 position. She also had an ultrasound performed that showed the diameter to be at least 6 cm and multiple left axillary lymph nodes that were abnormal. She then went on to have a biopsy performed on 09/30/2012. 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 Patient went on to have MRIs of the breasts performed and was noted to be marked diffuse enhancement involving majority of the left breast measuring 13.0 x 12.5 x 9.5 cm. There was diffuse skin thickening and dermal enhancement. Multiple enlarged level I left axillary lymph nodes. The largest lymph node measured 2.4 cm in size. The large area of enhancement within the left breast extends posteriorly to abut the anterior portion of the pectoralis muscle. There was no evidence of internal mammary adenopathy or right axillary adenopathy.  PET/CT did show the left breast mass, and left axillary and subpectoralis nodal metastases, but no further areas throughout the chest/abd/pelvis of metastases.   3.  Patient currently undergoing  neoadjuvant chemotherapy with FEC x 6 cycles starting on 11/02/12, which will be followed by 12 cycles of weekly Taxol/Carbo that started on 02/08/13.  CURRENT THERAPY: Taxol Carbo week 6  INTERVAL HISTORY: Christine [name] 57 y.o. female returns for follow up today prior to chemotherapy.  She is doing well today.  She is taking Gabapentin TID for the numbness in her fingertips and it is much improved. Her fingernails are beginning to separate from the nail bed. Otherwise, she is doing well and denies fevers, chills, nausea, vomiting, constipation, diarrhea, or any other concerns.  A 10 point ROS is otherwise negative.    MEDICAL HISTORY: Past Medical History  Diagnosis Date  . ALLERGIC RHINITIS   . GERD (gastroesophageal reflux disease)   . Hypertension   . Hypothyroidism   . Hx of colonic polyps   . Wears glasses   . Breast cancer 64, age 44    right  . Breast cancer 09/30/12    left, ER/PR -, Her 2 -    ALLERGIES:  is allergic to sulfa antibiotics and sulfonamide derivatives.  MEDICATIONS:  Current Outpatient Prescriptions  Medication Sig Dispense Refill  . cephALEXin (KEFLEX) 500 MG capsule Take 1 capsule (500 mg total) by mouth 4 (four) times daily.  40 capsule  0  . dexamethasone (DECADRON) 4 MG tablet TAKE 2 TABS PO QD ON DAY AFTER CHEMO THEN 2 TABS BID X 2 DAYS  30 tablet  1  . diltiazem (CARDIZEM CD) 240 MG 24 hr capsule TAKE ONE CAPSULE BY MOUTH EVERY DAY  90 capsule  3  . doxycycline (VIBRA-TABS) 100  MG tablet Take 1 tablet (100 mg total) by mouth 2 (two) times daily.  28 tablet  2  . fluconazole (DIFLUCAN) 200 MG tablet Take 1 tablet (200 mg total) by mouth every other day.  10 tablet  2  . gabapentin (NEURONTIN) 100 MG capsule Take 2 capsules TID x 2 days, then take  Three capsules TID  270 capsule  2  . latanoprost (XALATAN) 0.005 % ophthalmic solution Place 1 drop into both eyes at bedtime.       Marland Kitchen levothyroxine (SYNTHROID, LEVOTHROID) 50 MCG tablet TAKE 1 TABLET (50 MCG  TOTAL) BY MOUTH DAILY.  90 tablet  2  . lidocaine-prilocaine (EMLA) cream Apply topically as needed.  30 g  6  . LORazepam (ATIVAN) 0.5 MG tablet Take 1 tablet (0.5 mg total) by mouth every 6 (six) hours as needed (Nausea or vomiting).  30 tablet  0  . losartan-hydrochlorothiazide (HYZAAR) 50-12.5 MG per tablet Take 1 tablet by mouth daily.  30 tablet  3  . NEXIUM 40 MG capsule TAKE 1 CAPSULE (40 MG TOTAL) BY MOUTH DAILY BEFORE BREAKFAST.  90 capsule  1  . nystatin (MYCOSTATIN) 100000 UNIT/ML suspension Take 5 mLs (500,000 Units total) by mouth 4 (four) times daily.  240 mL  1  . potassium chloride SA (K-DUR,KLOR-CON) 20 MEQ tablet Take 2 tablets (40 mEq total) by mouth 2 (two) times daily.  120 tablet  0   No current facility-administered medications for this visit.    SURGICAL HISTORY:  Past Surgical History  Procedure Laterality Date  . Tonsillectomy    . Caesarean section      x 1  . Mastectomy  04/1987    right side  . Abdominal hysterectomy  06/1988    fibroids  . Portacath placement Right 10/28/2012    Procedure: PORT PLACEMENT;  Surgeon: Clovis Pu. Cornett, MD;  Location: [facility];  Service: General;  Laterality: Right;  Right Subclavian Vein    REVIEW OF SYSTEMS:  General: fatigue (+), night sweats (-), fever (-), pain (-) Lymph: palpable nodes (-) HEENT: vision changes (-), mucositis (-), gum bleeding (-), epistaxis (-) Cardiovascular: chest pain (-), palpitations (-) Pulmonary: shortness of breath (-), dyspnea on exertion (-), cough (-), hemoptysis (-) GI:  Early satiety (-), melena (-), dysphagia (-), nausea/vomiting (-), diarrhea (-) GU: dysuria (-), hematuria (-), incontinence (-) Musculoskeletal: joint swelling (-), joint pain (-), back pain (-) Neuro: weakness (-), numbness (-), headache (-), confusion (-) Skin: Rash (-), lesions (-), dryness (-) Psych: depression (-), suicidal/homicidal ideation (-), feeling of hopelessness (-)   PHYSICAL  EXAMINATION: Blood pressure 125/82, pulse 109, temperature 98.4 F (36.9 C), temperature source Oral, resp. rate 20, height 5\' 4"  (1.626 m), weight 213 lb 9.6 oz (96.888 kg). Body mass index is 36.65 kg/(m^2). General: Patient is a well appearing female in no acute distress HEENT: PERRLA, sclerae anicteric no conjunctival pallor, MMM. Ulcer on left lateral tongue and right buccal mucosa Neck: supple, no palpable adenopathy Lungs: clear to auscultation bilaterally, no wheezes, rhonchi, or rales Cardiovascular: regular rate rhythm, S1, S2, no murmurs, rubs or gallops Abdomen: Soft, non-tender, non-distended, normoactive bowel sounds, no HSM Extremities: warm and well perfused, no clubbing, cyanosis, or edema Skin: No rashes or lesions Neuro: Non-focal Breasts: Right mastectomy site without nodularity or sign of recurrence.  Left breast mass palpable along with dimpling of skin and inversion of the nipple--much softer and smaller.  Right chest wall port now closed ECOG PERFORMANCE  STATUS: 1 - Symptomatic but completely ambulatory  LABORATORY DATA: Lab Results  Component Value Date   WBC 5.8 03/29/2013   HGB 9.5* 03/29/2013   HCT 28.9* 03/29/2013   MCV 95.1 03/29/2013   PLT 129* 03/29/2013      Chemistry      Component Value Date/Time   NA 144 03/29/2013 1305   NA 144 10/28/2012 1248   K 3.1* 03/29/2013 1305   K 3.9 10/28/2012 1248   CL 101 01/25/2013 0952   CL 108 10/28/2012 1248   CO2 28 03/29/2013 1305   CO2 29 09/13/2011 1910   BUN 11.3 03/29/2013 1305   BUN 10 10/28/2012 1248   CREATININE 0.7 03/29/2013 1305   CREATININE 0.80 10/28/2012 1248      Component Value Date/Time   CALCIUM 8.7 03/29/2013 1305   CALCIUM 9.4 09/13/2011 1910   ALKPHOS 79 03/29/2013 1305   ALKPHOS 78 09/13/2011 1910   AST 24 03/29/2013 1305   AST 18 09/13/2011 1910   ALT 19 03/29/2013 1305   ALT 12 09/13/2011 1910   BILITOT 0.36 03/29/2013 1305   BILITOT 0.3 09/13/2011 1910       RADIOGRAPHIC STUDIES:  Ct Chest W  Contrast  10/27/2012  *RADIOLOGY REPORT*  Clinical Data:  High risk breast cancer staging.  Right breast cancer diagnosed 1988.  Left breast cancer diagnosed February 2014.  CT CHEST, ABDOMEN AND PELVIS WITH CONTRAST  Technique:  Multidetector CT imaging of the chest, abdomen and pelvis was performed following the standard protocol during bolus administration of intravenous contrast.  Contrast: OMNIPAQUE IOHEXOL 300 MG/ML  SOLN  Comparison:  PET CT scan 03/26 1014  CT CHEST  Findings:  Right breast mastectomy anatomy.  There is thickening of the skin of the left breast to 11 mm.  There is a mass within the deep posterior left breast measuring 29 x 30 mm (image 6).  There are enlarged round abnormal lymph nodes in the left axilla measuring up to 20 mm short axis (image 22).  Lymph nodes extend beneath the sub pectoralis muscles on the left (image 12).  There is no evidence of internal mammary lymphadenopathy.  No infraclavicular adenopathy.  No mediastinal adenopathy.  No pericardial fluid.  No suspicious pulmonary nodules.  IMPRESSION:  1.  Left breast mass consistent with breast carcinoma. 2.  Skin thickening over the left breast is concerning for inflammatory breast cancer. 3.  Nodal metastasis in the left axilla and sub pectoralis nodal stations. 4.  No evidence of central nodal metastasis or mediastinal metastasis  CT ABDOMEN AND PELVIS  Findings:  No focal hepatic lesion.  The gallbladder, pancreas, spleen, adrenal gland is, and kidneys are normal.  The stomach, small bowel, appendix, cecum are normal.  The colon and rectosigmoid colon are normal.  Abdominal aorta is normal caliber.  No retroperitoneal or periportal lymphadenopathy.  No mesenteric or peritoneal disease.  No free fluid in  the pelvis.  Post hysterectomy anatomy.  No pelvic lymphadenopathy. Review of  bone windows demonstrates no aggressive osseous lesions.  IMPRESSION:  No evidence of metastasis in the abdomen or pelvis.   Original Report  Authenticated By: Genevive Bi, M.D.    Ct Abdomen Pelvis W Contrast  10/27/2012  *RADIOLOGY REPORT*  Clinical Data:  High risk breast cancer staging.  Right breast cancer diagnosed 1988.  Left breast cancer diagnosed February 2014.  CT CHEST, ABDOMEN AND PELVIS WITH CONTRAST  Technique:  Multidetector CT imaging of the chest, abdomen and pelvis  was performed following the standard protocol during bolus administration of intravenous contrast.  Contrast: OMNIPAQUE IOHEXOL 300 MG/ML  SOLN  Comparison:  PET CT scan 03/26 1014  CT CHEST  Findings:  Right breast mastectomy anatomy.  There is thickening of the skin of the left breast to 11 mm.  There is a mass within the deep posterior left breast measuring 29 x 30 mm (image 6).  There are enlarged round abnormal lymph nodes in the left axilla measuring up to 20 mm short axis (image 22).  Lymph nodes extend beneath the sub pectoralis muscles on the left (image 12).  There is no evidence of internal mammary lymphadenopathy.  No infraclavicular adenopathy.  No mediastinal adenopathy.  No pericardial fluid.  No suspicious pulmonary nodules.  IMPRESSION:  1.  Left breast mass consistent with breast carcinoma. 2.  Skin thickening over the left breast is concerning for inflammatory breast cancer. 3.  Nodal metastasis in the left axilla and sub pectoralis nodal stations. 4.  No evidence of central nodal metastasis or mediastinal metastasis  CT ABDOMEN AND PELVIS  Findings:  No focal hepatic lesion.  The gallbladder, pancreas, spleen, adrenal gland is, and kidneys are normal.  The stomach, small bowel, appendix, cecum are normal.  The colon and rectosigmoid colon are normal.  Abdominal aorta is normal caliber.  No retroperitoneal or periportal lymphadenopathy.  No mesenteric or peritoneal disease.  No free fluid in  the pelvis.  Post hysterectomy anatomy.  No pelvic lymphadenopathy. Review of  bone windows demonstrates no aggressive osseous lesions.  IMPRESSION:  No  evidence of metastasis in the abdomen or pelvis.   Original Report Authenticated By: Genevive Bi, M.D.    Mr Breast Bilateral W Wo Contrast  10/12/2012  *RADIOLOGY REPORT*  Clinical Data: History of previous malignant mastectomy of the right breast 25 years ago.  The patient developed diffuse skin thickening and firmness within the left breast.  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  BUN and creatinine were obtained on site at [facility] at [address] Results:  BUN 7 mg/dL,  Creatinine 1.0 mg/dL.  BILATERAL BREAST MRI WITH AND WITHOUT CONTRAST  Technique: Multiplanar, multisequence MR images of both breasts were obtained prior to and following the intravenous administration of 20ml of Multihance.  Three dimensional images were evaluated at the independent DynaCad workstation.  Comparison:  Mammograms dated 09/30/2012 and 09/28/2012.  Findings: There has been a previous right mastectomy.  There is marked diffuse enhancement involving the majority of the left breast.  This measures 13.0 x 12.5 x 9.5 cm in size.  This is associated with a mixture of plateau and washout enhancement kinetics.  In addition,  there is diffuse skin thickening and dermal enhancement.  There are multiple enlarged level I left axillary lymph nodes.  The largest lymph node measures 2.4 cm in size.  The large area of enhancement within the left breast extends posteriorly to abut the anterior portion of the pectoral muscle. There is no evidence for internal mammary adenopathy or right axillary adenopathy.  There are no additional findings.  IMPRESSION:  1.  Large ( 13 cm) area of enhancement involving the majority of the left breast consistent with the patient's known invasive ductal carcinoma and DCIS. This extends posteriorly to abut the pectoralis muscle. 2.  Multiple enlarged level I left  axillary lymph nodes. 3.  Previous right  mastectomy.  RECOMMENDATION: Treatment plan  THREE-DIMENSIONAL MR IMAGE RENDERING ON INDEPENDENT WORKSTATION:  Three-dimensional MR images were rendered by post-processing of the original MR data on an independent workstation.  The three- dimensional MR images were interpreted, and findings were reported in the accompanying complete MRI report for this study.  BI-RADS CATEGORY 6:  Known biopsy-proven malignancy - appropriate action should be taken.   Original Report Authenticated By: Rolla Plate, M.D.    Nm Pet Image Initial (pi) Skull Base To Thigh  10/27/2012  *RADIOLOGY REPORT*  Clinical Data: Initial treatment strategy for high risk of breast cancer. Triple negative  NUCLEAR MEDICINE PET SKULL BASE TO THIGH  Fasting Blood Glucose:  97  Technique:  17.9 mCi F-18 FDG was injected intravenously. CT data was obtained and used for attenuation correction and anatomic localization only.  (This was not acquired as a diagnostic CT examination.) Additional exam technical data entered on technologist worksheet.  Comparison:  Findings:  Neck: No hypermetabolic lymph nodes in the neck.  Chest:  There is intense metabolic activity ( SUV max = 16.1) associated with a large portion of the glandular tissue of the left breast consistent with carcinoma.  There are intensely hypermetabolic left axillary lymph nodes with SUV max = 15.1.  These nodes are rounded and measure up to 2 cm. Hypermetabolic nodes extend to the sub pectoralis nodal station (image 71).  There is a single hypermetabolic internal mammary lymph node measuring 5 mm just left of the sternum (image 87).  This has intense metabolic activity for size with SUV max = 7.1.  No hypermetabolic mediastinal lymph nodes.  No hypermetabolic pulmonary nodules.  Abdomen/Pelvis:  No abnormal hypermetabolic activity within the liver, pancreas, adrenal glands, or spleen.  No hypermetabolic lymph nodes in the abdomen or pelvis.   Skeleton:  No focal hypermetabolic activity to suggest skeletal metastasis.  IMPRESSION:  1.  Hypermetabolic breast tissue on the left consistent with primary carcinoma. 2.  Hypermetabolic nodal metastasis in the left axilla extending to the sub pectoralis nodes. 3.  Single hypermetabolic left internal mammary lymph node.  4.  No evidence of pulmonary metastasis.  5.  No evidence of metastasis beneath hemidiaphragms or within the skeleton.   Original Report Authenticated By: Genevive Bi, M.D.    Dg Chest Port 1 View  10/28/2012  *RADIOLOGY REPORT*  Clinical Data: Port-A-Cath placement.  PORTABLE CHEST - 1 VIEW  Comparison: None.  Findings: The patient is rotated on the study.  Right side Port-A- Cath is in place.  Tubing appears intact.  Tip of the catheter projects in the mid to lower superior vena cava.  No pneumothorax identified.  Heart size is normal.  Lungs are clear.  The patient is status post right mastectomy and axillary dissection.  IMPRESSION: Tip of Port-A-Cath projects over the mid to lower superior vena cava.  The patient is rotated on the study.  Exact position of the tip of the catheter could be determined with the lateral film. No pneumothorax.   Original Report Authenticated By: Holley Dexter, M.D.    Dg Fluoro Guide Cv Line-no Report  10/28/2012  CLINICAL DATA: [facility]   FLOURO GUIDE CV LINE  Fluoroscopy was utilized by the requesting physician.  No radiographic  interpretation.      ASSESSMENT:   57 year old female with  #1 history of breast cancer of the right breast status post mastectomy when she was 58 years old. Due to this she was referred for genetic testing.   #2 patient  now with left invasive ductal carcinoma with ductal carcinoma in situ measuring 13.0 x 12.5 x 9.5 cm on MRI with positive lymph nodes. Patient's tumor is ER negative PR negative HER-2/neu negative with a Ki-67 of 21%. Patient is being seen in medical oncology for neoadjuvant chemotherapy. Patient  and I discussed her pathology in detail. We discussed treatment for breast cancer including surgical and with chemotherapy. Patient understands that she may still need a mastectomy however by giving her neoadjuvant chemotherapy her surgery may be made a little bit easier hopefully with giving her negative margins. She understands that neoadjuvant chemotherapy will also treat her distant disease.I discussed type of chemotherapy that she should have. This would include dose dense FEC x6 cycles followed by weekly Taxol carboplatinum for 12 weeks.  Currently due to receive Taxol carbo week 6.    #3 Patient underwent staging studies with PET/CT, and received echo, port placement and chemotherapy class.    #4 Acute paronychia  #5 Hypokalemia  PLAN:   1. Doing well today.  Patient will proceed with chemotherapy today.    2. She will continue her potassium.  We will continue to monitor it.    3. She will return in 1 week for labs and evaluation for cycle 7 of treatment.    All questions were answered. The patient knows to call the clinic with any problems, questions or concerns. We can certainly see the patient much sooner if necessary.  I spent 25 minutes counseling the patient face to face. The total time spent in the appointment was 30 minutes.  Cherie Ouch Lyn Hollingshead, NP Medical Oncology [facility] Phone: [phone] 03/31/2013, 9:15 AM

## 2013-03-29 NOTE — Patient Instructions (Signed)
[facility] Discharge Instructions for Patients Receiving Chemotherapy  Today you received the following chemotherapy agents: Taxol and Carboplatin.  To help prevent nausea and vomiting after your treatment, we encourage you to take your nausea medication as prescribed.   If you develop nausea and vomiting that is not controlled by your nausea medication, call the clinic.   BELOW ARE SYMPTOMS THAT SHOULD BE REPORTED IMMEDIATELY:  *FEVER GREATER THAN 100.5 F  *CHILLS WITH OR WITHOUT FEVER  NAUSEA AND VOMITING THAT IS NOT CONTROLLED WITH YOUR NAUSEA MEDICATION  *UNUSUAL SHORTNESS OF BREATH  *UNUSUAL BRUISING OR BLEEDING  TENDERNESS IN MOUTH AND THROAT WITH OR WITHOUT PRESENCE OF ULCERS  *URINARY PROBLEMS  *BOWEL PROBLEMS  UNUSUAL RASH Items with * indicate a potential emergency and should be followed up as soon as possible.  Feel free to call the clinic you have any questions or concerns. The clinic phone number is [phone].    

## 2013-04-01 ENCOUNTER — Other Ambulatory Visit: Payer: Self-pay

## 2013-04-01 DIAGNOSIS — C50912 Malignant neoplasm of unspecified site of left female breast: Secondary | ICD-10-CM

## 2013-04-05 ENCOUNTER — Ambulatory Visit (HOSPITAL_BASED_OUTPATIENT_CLINIC_OR_DEPARTMENT_OTHER): Payer: BC Managed Care – PPO

## 2013-04-05 ENCOUNTER — Telehealth: Payer: Self-pay | Admitting: *Deleted

## 2013-04-05 ENCOUNTER — Encounter: Payer: Self-pay | Admitting: Adult Health

## 2013-04-05 ENCOUNTER — Other Ambulatory Visit (HOSPITAL_BASED_OUTPATIENT_CLINIC_OR_DEPARTMENT_OTHER): Payer: BC Managed Care – PPO | Admitting: Lab

## 2013-04-05 ENCOUNTER — Encounter: Payer: Self-pay | Admitting: Oncology

## 2013-04-05 ENCOUNTER — Telehealth: Payer: Self-pay | Admitting: Oncology

## 2013-04-05 ENCOUNTER — Ambulatory Visit (HOSPITAL_BASED_OUTPATIENT_CLINIC_OR_DEPARTMENT_OTHER): Payer: BC Managed Care – PPO | Admitting: Adult Health

## 2013-04-05 VITALS — BP 114/70 | HR 102 | Temp 98.1°F | Resp 20 | Ht 64.0 in | Wt 205.6 lb

## 2013-04-05 DIAGNOSIS — C773 Secondary and unspecified malignant neoplasm of axilla and upper limb lymph nodes: Secondary | ICD-10-CM

## 2013-04-05 DIAGNOSIS — Z5111 Encounter for antineoplastic chemotherapy: Secondary | ICD-10-CM

## 2013-04-05 DIAGNOSIS — C50919 Malignant neoplasm of unspecified site of unspecified female breast: Secondary | ICD-10-CM

## 2013-04-05 DIAGNOSIS — Z853 Personal history of malignant neoplasm of breast: Secondary | ICD-10-CM

## 2013-04-05 DIAGNOSIS — C50912 Malignant neoplasm of unspecified site of left female breast: Secondary | ICD-10-CM

## 2013-04-05 DIAGNOSIS — E876 Hypokalemia: Secondary | ICD-10-CM

## 2013-04-05 DIAGNOSIS — Z171 Estrogen receptor negative status [ER-]: Secondary | ICD-10-CM

## 2013-04-05 DIAGNOSIS — J329 Chronic sinusitis, unspecified: Secondary | ICD-10-CM

## 2013-04-05 LAB — CBC WITH DIFFERENTIAL/PLATELET
Basophils Absolute: 0 10*3/uL (ref 0.0–0.1)
EOS%: 0.2 % (ref 0.0–7.0)
Eosinophils Absolute: 0 10*3/uL (ref 0.0–0.5)
HCT: 31.2 % — ABNORMAL LOW (ref 34.8–46.6)
HGB: 10.8 g/dL — ABNORMAL LOW (ref 11.6–15.9)
MCH: 31.8 pg (ref 25.1–34.0)
MCV: 91.8 fL (ref 79.5–101.0)
NEUT#: 3.4 10*3/uL (ref 1.5–6.5)
NEUT%: 63 % (ref 38.4–76.8)
lymph#: 1.6 10*3/uL (ref 0.9–3.3)

## 2013-04-05 LAB — COMPREHENSIVE METABOLIC PANEL (CC13)
AST: 17 U/L (ref 5–34)
Albumin: 3.5 g/dL (ref 3.5–5.0)
BUN: 19.2 mg/dL (ref 7.0–26.0)
CO2: 26 mEq/L (ref 22–29)
Calcium: 9.4 mg/dL (ref 8.4–10.4)
Chloride: 100 mEq/L (ref 98–109)
Creatinine: 1.1 mg/dL (ref 0.6–1.1)
Glucose: 383 mg/dl — ABNORMAL HIGH (ref 70–140)

## 2013-04-05 MED ORDER — AZITHROMYCIN 250 MG PO TABS: ORAL_TABLET | ORAL | Status: DC

## 2013-04-05 MED ORDER — HEPARIN SOD (PORK) LOCK FLUSH 100 UNIT/ML IV SOLN
500.0000 [IU] | Freq: Once | INTRAVENOUS | Status: AC | PRN
  Administered 2013-04-05: 500 [IU]
  Filled 2013-04-05: qty 5

## 2013-04-05 MED ORDER — PACLITAXEL CHEMO INJECTION 300 MG/50ML
80.0000 mg/m2 | Freq: Once | INTRAVENOUS | Status: AC
  Administered 2013-04-05: 162 mg via INTRAVENOUS
  Filled 2013-04-05: qty 27

## 2013-04-05 MED ORDER — DEXAMETHASONE SODIUM PHOSPHATE 20 MG/5ML IJ SOLN
20.0000 mg | Freq: Once | INTRAMUSCULAR | Status: AC
  Administered 2013-04-05: 20 mg via INTRAVENOUS

## 2013-04-05 MED ORDER — ONDANSETRON 16 MG/50ML IVPB (CHCC)
16.0000 mg | Freq: Once | INTRAVENOUS | Status: AC
  Administered 2013-04-05: 16 mg via INTRAVENOUS

## 2013-04-05 MED ORDER — FLUTICASONE PROPIONATE 50 MCG/ACT NA SUSP: 2.0000 | Freq: Every day | NASAL | Status: DC

## 2013-04-05 MED ORDER — SODIUM CHLORIDE 0.9 % IJ SOLN
10.0000 mL | INTRAMUSCULAR | Status: DC | PRN
  Administered 2013-04-05: 10 mL
  Filled 2013-04-05: qty 10

## 2013-04-05 MED ORDER — DIPHENHYDRAMINE HCL 50 MG/ML IJ SOLN
50.0000 mg | Freq: Once | INTRAMUSCULAR | Status: AC
  Administered 2013-04-05: 50 mg via INTRAVENOUS

## 2013-04-05 MED ORDER — FAMOTIDINE IN NACL 20-0.9 MG/50ML-% IV SOLN
20.0000 mg | Freq: Once | INTRAVENOUS | Status: AC
  Administered 2013-04-05: 20 mg via INTRAVENOUS

## 2013-04-05 MED ORDER — SODIUM CHLORIDE 0.9 % IV SOLN
Freq: Once | INTRAVENOUS | Status: AC
  Administered 2013-04-05: 16:00:00 via INTRAVENOUS

## 2013-04-05 MED ORDER — SODIUM CHLORIDE 0.9 % IV SOLN
300.0000 mg | Freq: Once | INTRAVENOUS | Status: AC
  Administered 2013-04-05: 300 mg via INTRAVENOUS
  Filled 2013-04-05: qty 30

## 2013-04-05 NOTE — Telephone Encounter (Signed)
Per staff message and POF I have scheduled appts.  JMW  

## 2013-04-05 NOTE — Patient Instructions (Addendum)
Doing well.  Proceed with treatment.  Please call us if you have any questions or concerns.    

## 2013-04-05 NOTE — Patient Instructions (Addendum)
[facility] Discharge Instructions for Patients Receiving Chemotherapy  Today you received the following chemotherapy agents :  Taxol, Carboplatin.  To help prevent nausea and vomiting after your treatment, we encourage you to take your nausea medication as instructed by your physician.   If you develop nausea and vomiting that is not controlled by your nausea medication, call the clinic.   BELOW ARE SYMPTOMS THAT SHOULD BE REPORTED IMMEDIATELY:  *FEVER GREATER THAN 100.5 F  *CHILLS WITH OR WITHOUT FEVER  NAUSEA AND VOMITING THAT IS NOT CONTROLLED WITH YOUR NAUSEA MEDICATION  *UNUSUAL SHORTNESS OF BREATH  *UNUSUAL BRUISING OR BLEEDING  TENDERNESS IN MOUTH AND THROAT WITH OR WITHOUT PRESENCE OF ULCERS  *URINARY PROBLEMS  *BOWEL PROBLEMS  UNUSUAL RASH Items with * indicate a potential emergency and should be followed up as soon as possible.  Feel free to call the clinic you have any questions or concerns. The clinic phone number is [phone].    

## 2013-04-05 NOTE — Progress Notes (Signed)
OFFICE PROGRESS NOTE  CC**  Christine Mew, MD [address] 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   PRIOR THERAPY:  1. Patient has history of right breast cancer patient underwent a mastectomy when she was 57 years old. This occurred during her pregnancy. After that patient did not take any kind of further treatments. She continued to do well.   2.Recently she noted changes in her nipple had become itchy and retracted. Because of this she underwent mammogram with ultrasound that showed 2 areas in the left breast one at 12:00 position and a second at 3:00 position. She also had an ultrasound performed that showed the diameter to be at least 6 cm and multiple left axillary lymph nodes that were abnormal. She then went on to have a biopsy performed on 09/30/2012. 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 Patient went on to have MRIs of the breasts performed and was noted to be marked diffuse enhancement involving majority of the left breast measuring 13.0 x 12.5 x 9.5 cm. There was diffuse skin thickening and dermal enhancement. Multiple enlarged level I left axillary lymph nodes. The largest lymph node measured 2.4 cm in size. The large area of enhancement within the left breast extends posteriorly to abut the anterior portion of the pectoralis muscle. There was no evidence of internal mammary adenopathy or right axillary adenopathy.  PET/CT did show the left breast mass, and left axillary and subpectoralis nodal metastases, but no further areas throughout the chest/abd/pelvis of metastases.   3.  Patient currently undergoing  neoadjuvant chemotherapy with FEC x 6 cycles starting on 11/02/12, which will be followed by 12 cycles of weekly Taxol/Carbo that started on 02/08/13.  CURRENT THERAPY: Taxol Carbo week 7  INTERVAL HISTORY: Christine [name] 57 y.o. female returns for follow up today prior to chemotherapy.  She is doing well today.  She is taking Gabapentin TID for the numbness in her fingertips and it remains improved. She has had congestion for 3 days, and started getting dizzy at this time.  She has not lost consciousness and has not changed her PO intake either.  Otherwise, she denies fevers, chills, nausea, vomiting, constipation, diarrhea, or any further concerns.    MEDICAL HISTORY: Past Medical History  Diagnosis Date  . ALLERGIC RHINITIS   . GERD (gastroesophageal reflux disease)   . Hypertension   . Hypothyroidism   . Hx of colonic polyps   . Wears glasses   . Breast cancer 21, age 65    right  . Breast cancer 09/30/12    left, ER/PR -, Her 2 -    ALLERGIES:  is allergic to sulfa antibiotics and sulfonamide derivatives.  MEDICATIONS:  Current Outpatient Prescriptions  Medication Sig Dispense Refill  . cephALEXin (KEFLEX) 500 MG capsule Take 1 capsule (500 mg total) by mouth 4 (four) times daily.  40 capsule  0  . dexamethasone (DECADRON) 4 MG tablet TAKE 2 TABS PO QD ON DAY AFTER CHEMO THEN 2 TABS BID X 2 DAYS  30 tablet  1  . diltiazem (CARDIZEM CD) 240 MG 24 hr capsule TAKE ONE CAPSULE BY MOUTH EVERY DAY  90 capsule  3  . doxycycline (VIBRA-TABS) 100 MG tablet Take 1 tablet (100 mg total) by mouth 2 (two) times daily.  28 tablet  2  . fluconazole (DIFLUCAN) 200 MG tablet Take 1 tablet (200 mg total) by mouth every other day.  10 tablet  2  . gabapentin (NEURONTIN) 100 MG capsule Take 2 capsules TID x 2 days, then take  Three capsules TID  270 capsule  2  . latanoprost (XALATAN) 0.005 % ophthalmic solution Place 1 drop into both eyes at bedtime.       Marland Kitchen levothyroxine (SYNTHROID, LEVOTHROID) 50  MCG tablet TAKE 1 TABLET (50 MCG TOTAL) BY MOUTH DAILY.  90 tablet  2  . lidocaine-prilocaine (EMLA) cream Apply topically as needed.  30 g  6  . LORazepam (ATIVAN) 0.5 MG tablet Take 1 tablet (0.5 mg total) by mouth every 6 (six) hours as needed (Nausea or vomiting).  30 tablet  0  . losartan-hydrochlorothiazide (HYZAAR) 50-12.5 MG per tablet Take 1 tablet by mouth daily.  30 tablet  3  . NEXIUM 40 MG capsule TAKE 1 CAPSULE (40 MG TOTAL) BY MOUTH DAILY BEFORE BREAKFAST.  90 capsule  1  . nystatin (MYCOSTATIN) 100000 UNIT/ML suspension Take 5 mLs (500,000 Units total) by mouth 4 (four) times daily.  240 mL  1  . potassium chloride SA (K-DUR,KLOR-CON) 20 MEQ tablet Take 2 tablets (40 mEq total) by mouth 2 (two) times daily.  120 tablet  0   No current facility-administered medications for this visit.    SURGICAL HISTORY:  Past Surgical History  Procedure Laterality Date  . Tonsillectomy    . Caesarean section      x 1  . Mastectomy  04/1987    right side  . Abdominal hysterectomy  06/1988    fibroids  . Portacath placement Right 10/28/2012    Procedure: PORT PLACEMENT;  Surgeon: Clovis Pu. Cornett, MD;  Location: [facility];  Service: General;  Laterality: Right;  Right Subclavian Vein    REVIEW OF SYSTEMS:  General: fatigue (+), night sweats (-), fever (-), pain (-) Lymph: palpable nodes (-) HEENT: vision changes (-), mucositis (-), gum bleeding (-), epistaxis (-) Cardiovascular: chest pain (-), palpitations (-) Pulmonary: shortness of breath (-), dyspnea on exertion (-), cough (-), hemoptysis (-) GI:  Early satiety (-), melena (-), dysphagia (-), nausea/vomiting (-), diarrhea (-) GU: dysuria (-), hematuria (-), incontinence (-) Musculoskeletal: joint swelling (-), joint pain (-), back pain (-) Neuro: weakness (-), numbness (-), headache (-), confusion (-) Skin: Rash (-), lesions (-), dryness (-) Psych: depression (-), suicidal/homicidal ideation (-), feeling of  hopelessness (-)   PHYSICAL EXAMINATION: Blood pressure 114/70, pulse 102, temperature 98.1 F (36.7 C), temperature source Oral, resp. rate 20, height 5\' 4"  (1.626 m), weight 205 lb 9.6 oz (93.26 kg). Body mass index is 35.27 kg/(m^2). General: Patient is a well appearing female in no acute distress HEENT: PERRLA, sclerae anicteric no conjunctival pallor, MMM. Ulcer on left lateral tongue and right buccal mucosa Neck: supple, no palpable adenopathy Lungs: clear to auscultation bilaterally, no wheezes, rhonchi, or rales Cardiovascular: regular rate rhythm, S1, S2, no murmurs, rubs or gallops Abdomen: Soft, non-tender, non-distended, normoactive bowel sounds, no HSM Extremities: warm and well perfused, no clubbing, cyanosis, or edema Skin: No rashes or lesions Neuro: Non-focal Breasts: Right mastectomy site without nodularity or sign of recurrence.  Left breast mass palpable along with dimpling of skin and inversion of the nipple--much softer and smaller.  Right chest  wall port now closed ECOG PERFORMANCE STATUS: 1 - Symptomatic but completely ambulatory  LABORATORY DATA: Lab Results  Component Value Date   WBC 5.4 04/05/2013   HGB 10.8* 04/05/2013   HCT 31.2* 04/05/2013   MCV 91.8 04/05/2013   PLT 166 04/05/2013      Chemistry      Component Value Date/Time   NA 144 03/29/2013 1305   NA 144 10/28/2012 1248   K 3.1* 03/29/2013 1305   K 3.9 10/28/2012 1248   CL 101 01/25/2013 0952   CL 108 10/28/2012 1248   CO2 28 03/29/2013 1305   CO2 29 09/13/2011 1910   BUN 11.3 03/29/2013 1305   BUN 10 10/28/2012 1248   CREATININE 0.7 03/29/2013 1305   CREATININE 0.80 10/28/2012 1248      Component Value Date/Time   CALCIUM 8.7 03/29/2013 1305   CALCIUM 9.4 09/13/2011 1910   ALKPHOS 79 03/29/2013 1305   ALKPHOS 78 09/13/2011 1910   AST 24 03/29/2013 1305   AST 18 09/13/2011 1910   ALT 19 03/29/2013 1305   ALT 12 09/13/2011 1910   BILITOT 0.36 03/29/2013 1305   BILITOT 0.3 09/13/2011 1910       RADIOGRAPHIC  STUDIES:  Ct Chest W Contrast  10/27/2012  *RADIOLOGY REPORT*  Clinical Data:  High risk breast cancer staging.  Right breast cancer diagnosed 1988.  Left breast cancer diagnosed February 2014.  CT CHEST, ABDOMEN AND PELVIS WITH CONTRAST  Technique:  Multidetector CT imaging of the chest, abdomen and pelvis was performed following the standard protocol during bolus administration of intravenous contrast.  Contrast: OMNIPAQUE IOHEXOL 300 MG/ML  SOLN  Comparison:  PET CT scan 03/26 1014  CT CHEST  Findings:  Right breast mastectomy anatomy.  There is thickening of the skin of the left breast to 11 mm.  There is a mass within the deep posterior left breast measuring 29 x 30 mm (image 6).  There are enlarged round abnormal lymph nodes in the left axilla measuring up to 20 mm short axis (image 22).  Lymph nodes extend beneath the sub pectoralis muscles on the left (image 12).  There is no evidence of internal mammary lymphadenopathy.  No infraclavicular adenopathy.  No mediastinal adenopathy.  No pericardial fluid.  No suspicious pulmonary nodules.  IMPRESSION:  1.  Left breast mass consistent with breast carcinoma. 2.  Skin thickening over the left breast is concerning for inflammatory breast cancer. 3.  Nodal metastasis in the left axilla and sub pectoralis nodal stations. 4.  No evidence of central nodal metastasis or mediastinal metastasis  CT ABDOMEN AND PELVIS  Findings:  No focal hepatic lesion.  The gallbladder, pancreas, spleen, adrenal gland is, and kidneys are normal.  The stomach, small bowel, appendix, cecum are normal.  The colon and rectosigmoid colon are normal.  Abdominal aorta is normal caliber.  No retroperitoneal or periportal lymphadenopathy.  No mesenteric or peritoneal disease.  No free fluid in  the pelvis.  Post hysterectomy anatomy.  No pelvic lymphadenopathy. Review of  bone windows demonstrates no aggressive osseous lesions.  IMPRESSION:  No evidence of metastasis in the abdomen or  pelvis.   Original Report Authenticated By: Genevive Bi, M.D.    Ct Abdomen Pelvis W Contrast  10/27/2012  *RADIOLOGY REPORT*  Clinical Data:  High risk breast cancer staging.  Right breast cancer diagnosed 1988.  Left breast cancer diagnosed February 2014.  CT CHEST, ABDOMEN AND PELVIS WITH CONTRAST  Technique:  Multidetector CT imaging  of the chest, abdomen and pelvis was performed following the standard protocol during bolus administration of intravenous contrast.  Contrast: OMNIPAQUE IOHEXOL 300 MG/ML  SOLN  Comparison:  PET CT scan 03/26 1014  CT CHEST  Findings:  Right breast mastectomy anatomy.  There is thickening of the skin of the left breast to 11 mm.  There is a mass within the deep posterior left breast measuring 29 x 30 mm (image 6).  There are enlarged round abnormal lymph nodes in the left axilla measuring up to 20 mm short axis (image 22).  Lymph nodes extend beneath the sub pectoralis muscles on the left (image 12).  There is no evidence of internal mammary lymphadenopathy.  No infraclavicular adenopathy.  No mediastinal adenopathy.  No pericardial fluid.  No suspicious pulmonary nodules.  IMPRESSION:  1.  Left breast mass consistent with breast carcinoma. 2.  Skin thickening over the left breast is concerning for inflammatory breast cancer. 3.  Nodal metastasis in the left axilla and sub pectoralis nodal stations. 4.  No evidence of central nodal metastasis or mediastinal metastasis  CT ABDOMEN AND PELVIS  Findings:  No focal hepatic lesion.  The gallbladder, pancreas, spleen, adrenal gland is, and kidneys are normal.  The stomach, small bowel, appendix, cecum are normal.  The colon and rectosigmoid colon are normal.  Abdominal aorta is normal caliber.  No retroperitoneal or periportal lymphadenopathy.  No mesenteric or peritoneal disease.  No free fluid in  the pelvis.  Post hysterectomy anatomy.  No pelvic lymphadenopathy. Review of  bone windows demonstrates no aggressive osseous  lesions.  IMPRESSION:  No evidence of metastasis in the abdomen or pelvis.   Original Report Authenticated By: Genevive Bi, M.D.    Mr Breast Bilateral W Wo Contrast  10/12/2012  *RADIOLOGY REPORT*  Clinical Data: History of previous malignant mastectomy of the right breast 25 years ago.  The patient developed diffuse skin thickening and firmness within the left breast.  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  BUN and creatinine were obtained on site at [facility] at [address] Results:  BUN 7 mg/dL,  Creatinine 1.0 mg/dL.  BILATERAL BREAST MRI WITH AND WITHOUT CONTRAST  Technique: Multiplanar, multisequence MR images of both breasts were obtained prior to and following the intravenous administration of 20ml of Multihance.  Three dimensional images were evaluated at the independent DynaCad workstation.  Comparison:  Mammograms dated 09/30/2012 and 09/28/2012.  Findings: There has been a previous right mastectomy.  There is marked diffuse enhancement involving the majority of the left breast.  This measures 13.0 x 12.5 x 9.5 cm in size.  This is associated with a mixture of plateau and washout enhancement kinetics.  In addition,  there is diffuse skin thickening and dermal enhancement.  There are multiple enlarged level I left axillary lymph nodes.  The largest lymph node measures 2.4 cm in size.  The large area of enhancement within the left breast extends posteriorly to abut the anterior portion of the pectoral muscle. There is no evidence for internal mammary adenopathy or right axillary adenopathy.  There are no additional findings.  IMPRESSION:  1.  Large ( 13 cm) area of enhancement involving the majority of the left breast consistent with the patient's known invasive ductal carcinoma and DCIS. This extends posteriorly to abut the pectoralis muscle. 2.   Multiple enlarged level I left axillary  lymph nodes. 3.  Previous right mastectomy.  RECOMMENDATION: Treatment plan  THREE-DIMENSIONAL MR IMAGE RENDERING ON INDEPENDENT WORKSTATION:  Three-dimensional MR images were rendered by post-processing of the original MR data on an independent workstation.  The three- dimensional MR images were interpreted, and findings were reported in the accompanying complete MRI report for this study.  BI-RADS CATEGORY 6:  Known biopsy-proven malignancy - appropriate action should be taken.   Original Report Authenticated By: Rolla Plate, M.D.    Nm Pet Image Initial (pi) Skull Base To Thigh  10/27/2012  *RADIOLOGY REPORT*  Clinical Data: Initial treatment strategy for high risk of breast cancer. Triple negative  NUCLEAR MEDICINE PET SKULL BASE TO THIGH  Fasting Blood Glucose:  97  Technique:  17.9 mCi F-18 FDG was injected intravenously. CT data was obtained and used for attenuation correction and anatomic localization only.  (This was not acquired as a diagnostic CT examination.) Additional exam technical data entered on technologist worksheet.  Comparison:  Findings:  Neck: No hypermetabolic lymph nodes in the neck.  Chest:  There is intense metabolic activity ( SUV max = 29.5) associated with a large portion of the glandular tissue of the left breast consistent with carcinoma.  There are intensely hypermetabolic left axillary lymph nodes with SUV max = 15.1.  These nodes are rounded and measure up to 2 cm. Hypermetabolic nodes extend to the sub pectoralis nodal station (image 71).  There is a single hypermetabolic internal mammary lymph node measuring 5 mm just left of the sternum (image 87).  This has intense metabolic activity for size with SUV max = 7.1.  No hypermetabolic mediastinal lymph nodes.  No hypermetabolic pulmonary nodules.  Abdomen/Pelvis:  No abnormal hypermetabolic activity within the liver, pancreas, adrenal glands, or spleen.  No hypermetabolic lymph nodes  in the abdomen or pelvis.  Skeleton:  No focal hypermetabolic activity to suggest skeletal metastasis.  IMPRESSION:  1.  Hypermetabolic breast tissue on the left consistent with primary carcinoma. 2.  Hypermetabolic nodal metastasis in the left axilla extending to the sub pectoralis nodes. 3.  Single hypermetabolic left internal mammary lymph node.  4.  No evidence of pulmonary metastasis.  5.  No evidence of metastasis beneath hemidiaphragms or within the skeleton.   Original Report Authenticated By: Genevive Bi, M.D.    Dg Chest Port 1 View  10/28/2012  *RADIOLOGY REPORT*  Clinical Data: Port-A-Cath placement.  PORTABLE CHEST - 1 VIEW  Comparison: None.  Findings: The patient is rotated on the study.  Right side Port-A- Cath is in place.  Tubing appears intact.  Tip of the catheter projects in the mid to lower superior vena cava.  No pneumothorax identified.  Heart size is normal.  Lungs are clear.  The patient is status post right mastectomy and axillary dissection.  IMPRESSION: Tip of Port-A-Cath projects over the mid to lower superior vena cava.  The patient is rotated on the study.  Exact position of the tip of the catheter could be determined with the lateral film. No pneumothorax.   Original Report Authenticated By: Holley Dexter, M.D.    Dg Fluoro Guide Cv Line-no Report  10/28/2012  CLINICAL DATA: [facility]   FLOURO GUIDE CV LINE  Fluoroscopy was utilized by the requesting physician.  No radiographic  interpretation.      ASSESSMENT:   57 year old female with  #1 history of breast cancer of the right breast status post mastectomy when she was 57 years old. Due to this she was referred for  genetic testing.   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 Patient's tumor is ER negative PR negative HER-2/neu negative with a Ki-67 of 21%. Patient is being seen in medical oncology for  neoadjuvant chemotherapy. Patient and I discussed her pathology in detail. We discussed treatment for breast cancer including surgical and with chemotherapy. Patient understands that she may still need a mastectomy however by giving her neoadjuvant chemotherapy her surgery may be made a little bit easier hopefully with giving her negative margins. She understands that neoadjuvant chemotherapy will also treat her distant disease.I discussed type of chemotherapy that she should have. This would include dose dense FEC x6 cycles followed by weekly Taxol carboplatinum for 12 weeks.  Currently due to receive Taxol carbo week 7.    #3 Patient underwent staging studies with PET/CT, and received echo, port placement and chemotherapy class.    #4 Hypokalemia  PLAN:   1. Doing well today.  Patient will proceed with chemotherapy today.  I prescribed Flonase and a Zpak for the nasal congestion and dizziness.    2. Her potassium was 3.1 last week and she increased it to po TID.  We will f/u on her level today.     3. She will return in 1 week for labs and evaluation for cycle 8 of treatment.    All questions were answered. The patient knows to call the clinic with any problems, questions or concerns. We can certainly see the patient much sooner if necessary.  I spent 25 minutes counseling the patient face to face. The total time spent in the appointment was 30 minutes.  Cherie Ouch Lyn Hollingshead, NP Medical Oncology [facility] Phone: [phone] 04/05/2013, 2:17 PM

## 2013-04-05 NOTE — Telephone Encounter (Signed)
, °

## 2013-04-11 ENCOUNTER — Other Ambulatory Visit: Payer: Self-pay | Admitting: Internal Medicine

## 2013-04-11 ENCOUNTER — Other Ambulatory Visit: Payer: Self-pay | Admitting: Emergency Medicine

## 2013-04-11 DIAGNOSIS — C50919 Malignant neoplasm of unspecified site of unspecified female breast: Secondary | ICD-10-CM

## 2013-04-12 ENCOUNTER — Ambulatory Visit (HOSPITAL_BASED_OUTPATIENT_CLINIC_OR_DEPARTMENT_OTHER): Payer: BC Managed Care – PPO

## 2013-04-12 ENCOUNTER — Other Ambulatory Visit (HOSPITAL_BASED_OUTPATIENT_CLINIC_OR_DEPARTMENT_OTHER): Payer: BC Managed Care – PPO | Admitting: Lab

## 2013-04-12 ENCOUNTER — Ambulatory Visit (HOSPITAL_BASED_OUTPATIENT_CLINIC_OR_DEPARTMENT_OTHER): Payer: BC Managed Care – PPO | Admitting: Adult Health

## 2013-04-12 ENCOUNTER — Encounter: Payer: Self-pay | Admitting: Oncology

## 2013-04-12 ENCOUNTER — Encounter: Payer: Self-pay | Admitting: Adult Health

## 2013-04-12 ENCOUNTER — Telehealth: Payer: Self-pay | Admitting: *Deleted

## 2013-04-12 VITALS — BP 113/78 | HR 105 | Temp 99.0°F | Resp 20 | Ht 64.0 in | Wt 197.9 lb

## 2013-04-12 DIAGNOSIS — E876 Hypokalemia: Secondary | ICD-10-CM

## 2013-04-12 DIAGNOSIS — C50919 Malignant neoplasm of unspecified site of unspecified female breast: Secondary | ICD-10-CM

## 2013-04-12 DIAGNOSIS — Z5189 Encounter for other specified aftercare: Secondary | ICD-10-CM

## 2013-04-12 DIAGNOSIS — C50912 Malignant neoplasm of unspecified site of left female breast: Secondary | ICD-10-CM

## 2013-04-12 DIAGNOSIS — C773 Secondary and unspecified malignant neoplasm of axilla and upper limb lymph nodes: Secondary | ICD-10-CM

## 2013-04-12 DIAGNOSIS — R197 Diarrhea, unspecified: Secondary | ICD-10-CM

## 2013-04-12 LAB — COMPREHENSIVE METABOLIC PANEL (CC13)
ALT: 15 U/L (ref 0–55)
CO2: 26 mEq/L (ref 22–29)
Chloride: 96 mEq/L — ABNORMAL LOW (ref 98–109)
Sodium: 133 mEq/L — ABNORMAL LOW (ref 136–145)
Total Bilirubin: 0.57 mg/dL (ref 0.20–1.20)
Total Protein: 6.8 g/dL (ref 6.4–8.3)

## 2013-04-12 LAB — CBC WITH DIFFERENTIAL/PLATELET
BASO%: 0 % (ref 0.0–2.0)
EOS%: 0.4 % (ref 0.0–7.0)
LYMPH%: 44.6 % (ref 14.0–49.7)
MCH: 31.8 pg (ref 25.1–34.0)
MCHC: 33.9 g/dL (ref 31.5–36.0)
MCV: 94 fL (ref 79.5–101.0)
MONO%: 8.6 % (ref 0.0–14.0)
NEUT#: 1.1 10*3/uL — ABNORMAL LOW (ref 1.5–6.5)
Platelets: 129 10*3/uL — ABNORMAL LOW (ref 145–400)
RBC: 3.33 10*6/uL — ABNORMAL LOW (ref 3.70–5.45)
RDW: 17.1 % — ABNORMAL HIGH (ref 11.2–14.5)

## 2013-04-12 MED ORDER — SODIUM CHLORIDE 0.9 % IV SOLN
Freq: Once | INTRAVENOUS | Status: AC
  Administered 2013-04-12: 16:00:00 via INTRAVENOUS

## 2013-04-12 MED ORDER — SODIUM CHLORIDE 0.9 % IV SOLN: Freq: Once | INTRAVENOUS | Status: DC

## 2013-04-12 NOTE — Progress Notes (Signed)
OFFICE PROGRESS NOTE  CC**  Christine Mew, MD [address] 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   PRIOR THERAPY:  1. Patient has history of right breast cancer patient underwent a mastectomy when she was 57 years old. This occurred during her pregnancy. After that patient did not take any kind of further treatments. She continued to do well.   2.Recently she noted changes in her nipple had become itchy and retracted. Because of this she underwent mammogram with ultrasound that showed 2 areas in the left breast one at 12:00 position and a second at 3:00 position. She also had an ultrasound performed that showed the diameter to be at least 6 cm and multiple left axillary lymph nodes that were abnormal. She then went on to have a biopsy performed on 09/30/2012. 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 Patient went on to have MRIs of the breasts performed and was noted to be marked diffuse enhancement involving majority of the left breast measuring 13.0 x 12.5 x 9.5 cm. There was diffuse skin thickening and dermal enhancement. Multiple enlarged level I left axillary lymph nodes. The largest lymph node measured 2.4 cm in size. The large area of enhancement within the left breast extends posteriorly to abut the anterior portion of the pectoralis muscle. There was no evidence of internal mammary adenopathy or right axillary adenopathy.  PET/CT did show the left breast mass, and left axillary and subpectoralis nodal metastases, but no further areas throughout the chest/abd/pelvis of metastases.   3.  Patient currently undergoing  neoadjuvant chemotherapy with FEC x 6 cycles starting on 11/02/12, which will be followed by 12 cycles of weekly Taxol/Carbo that started on 02/08/13.  CURRENT THERAPY: Taxol Carbo week 7  INTERVAL HISTORY: Christine [name] 57 y.o. female returns for follow up today prior to chemotherapy.  She is feeling very poorly.  She is weak and having diarrhea about 5 times per day, she does have dizziness.  She denies fevers, chills, recent sick contacts.  Otherwise, a 10 point ROS is neg.   MEDICAL HISTORY: Past Medical History  Diagnosis Date  . ALLERGIC RHINITIS   . GERD (gastroesophageal reflux disease)   . Hypertension   . Hypothyroidism   . Hx of colonic polyps   . Wears glasses   . Breast cancer 17, age 7    right  . Breast cancer 09/30/12    left, ER/PR -, Her 2 -    ALLERGIES:  is allergic to sulfa antibiotics and sulfonamide derivatives.  MEDICATIONS:  Current Outpatient Prescriptions  Medication Sig Dispense Refill  . azithromycin (ZITHROMAX) 250 MG tablet 2 tabs po on day 1, then one tab daily until complete  6 each  0  . dexamethasone (DECADRON) 4 MG tablet TAKE 2 TABS PO QD ON DAY AFTER CHEMO THEN 2 TABS BID X 2 DAYS  30 tablet  1  . diltiazem (CARDIZEM CD) 240 MG 24 hr capsule TAKE ONE CAPSULE BY MOUTH EVERY DAY  90 capsule  3  . doxycycline (VIBRA-TABS) 100 MG tablet Take 1 tablet (100 mg total) by mouth 2 (two) times daily.  28 tablet  2  .  fluconazole (DIFLUCAN) 200 MG tablet Take 1 tablet (200 mg total) by mouth every other day.  10 tablet  2  . fluticasone (FLONASE) 50 MCG/ACT nasal spray Place 2 sprays into the nose daily.  16 g  2  . gabapentin (NEURONTIN) 100 MG capsule Take 2 capsules TID x 2 days, then take  Three capsules TID  270 capsule  2  . latanoprost (XALATAN) 0.005 % ophthalmic solution Place 1 drop into both eyes at bedtime.       Marland Kitchen levothyroxine (SYNTHROID, LEVOTHROID) 50 MCG tablet TAKE 1 TABLET BY MOUTH EVERY DAY  90 tablet  2  . lidocaine-prilocaine (EMLA)  cream Apply topically as needed.  30 g  6  . LORazepam (ATIVAN) 0.5 MG tablet Take 1 tablet (0.5 mg total) by mouth every 6 (six) hours as needed (Nausea or vomiting).  30 tablet  0  . losartan-hydrochlorothiazide (HYZAAR) 50-12.5 MG per tablet Take 1 tablet by mouth daily.  30 tablet  3  . NEXIUM 40 MG capsule TAKE 1 CAPSULE (40 MG TOTAL) BY MOUTH DAILY BEFORE BREAKFAST.  90 capsule  1  . nystatin (MYCOSTATIN) 100000 UNIT/ML suspension Take 5 mLs (500,000 Units total) by mouth 4 (four) times daily.  240 mL  1  . potassium chloride SA (K-DUR,KLOR-CON) 20 MEQ tablet Take 2 tablets (40 mEq total) by mouth 2 (two) times daily.  120 tablet  0   No current facility-administered medications for this visit.    SURGICAL HISTORY:  Past Surgical History  Procedure Laterality Date  . Tonsillectomy    . Caesarean section      x 1  . Mastectomy  04/1987    right side  . Abdominal hysterectomy  06/1988    fibroids  . Portacath placement Right 10/28/2012    Procedure: PORT PLACEMENT;  Surgeon: Clovis Pu. Cornett, MD;  Location: [facility];  Service: General;  Laterality: Right;  Right Subclavian Vein    REVIEW OF SYSTEMS:  General: fatigue (+), night sweats (-), fever (-), pain (-) Lymph: palpable nodes (-) HEENT: vision changes (-), mucositis (-), gum bleeding (-), epistaxis (-) Cardiovascular: chest pain (-), palpitations (-) Pulmonary: shortness of breath (-), dyspnea on exertion (-), cough (-), hemoptysis (-) GI:  Early satiety (-), melena (-), dysphagia (-), nausea/vomiting (-), diarrhea (-) GU: dysuria (-), hematuria (-), incontinence (-) Musculoskeletal: joint swelling (-), joint pain (-), back pain (-) Neuro: weakness (-), numbness (-), headache (-), confusion (-) Skin: Rash (-), lesions (-), dryness (-) Psych: depression (-), suicidal/homicidal ideation (-), feeling of hopelessness (-)   PHYSICAL EXAMINATION: There were no vitals taken for this visit. There is no weight on  file to calculate BMI. General: Patient is a pale appearing female in no acute distress HEENT: PERRLA, sclerae anicteric no conjunctival pallor, MMM. Ulcer on left lateral tongue and right buccal mucosa Neck: supple, no palpable adenopathy Lungs: clear to auscultation bilaterally, no wheezes, rhonchi, or rales Cardiovascular: regular rate rhythm, S1, S2, no murmurs, rubs or gallops Abdomen: Soft, non-tender, non-distended, normoactive bowel sounds, no HSM Extremities: warm and well perfused, no clubbing, cyanosis, or edema Skin: No rashes or lesions Neuro: Non-focal Breasts: Right mastectomy site without nodularity or sign of recurrence.  Left breast mass palpable along with dimpling of skin and inversion of the nipple--much softer and smaller.  Right chest wall port now closed ECOG PERFORMANCE STATUS: 1 - Symptomatic but completely ambulatory  LABORATORY DATA: Lab Results  Component Value Date   WBC 2.3*  04/12/2013   HGB 10.6* 04/12/2013   HCT 31.3* 04/12/2013   MCV 94.0 04/12/2013   PLT 129* 04/12/2013      Chemistry      Component Value Date/Time   NA 136 04/05/2013 1325   NA 144 10/28/2012 1248   K 3.5 04/05/2013 1325   K 3.9 10/28/2012 1248   CL 101 01/25/2013 0952   CL 108 10/28/2012 1248   CO2 26 04/05/2013 1325   CO2 29 09/13/2011 1910   BUN 19.2 04/05/2013 1325   BUN 10 10/28/2012 1248   CREATININE 1.1 04/05/2013 1325   CREATININE 0.80 10/28/2012 1248      Component Value Date/Time   CALCIUM 9.4 04/05/2013 1325   CALCIUM 9.4 09/13/2011 1910   ALKPHOS 82 04/05/2013 1325   ALKPHOS 78 09/13/2011 1910   AST 17 04/05/2013 1325   AST 18 09/13/2011 1910   ALT 21 04/05/2013 1325   ALT 12 09/13/2011 1910   BILITOT 0.34 04/05/2013 1325   BILITOT 0.3 09/13/2011 1910       RADIOGRAPHIC STUDIES:  Ct Chest W Contrast  10/27/2012  *RADIOLOGY REPORT*  Clinical Data:  High risk breast cancer staging.  Right breast cancer diagnosed 1988.  Left breast cancer diagnosed February 2014.  CT CHEST, ABDOMEN AND PELVIS WITH  CONTRAST  Technique:  Multidetector CT imaging of the chest, abdomen and pelvis was performed following the standard protocol during bolus administration of intravenous contrast.  Contrast: OMNIPAQUE IOHEXOL 300 MG/ML  SOLN  Comparison:  PET CT scan 03/26 1014  CT CHEST  Findings:  Right breast mastectomy anatomy.  There is thickening of the skin of the left breast to 11 mm.  There is a mass within the deep posterior left breast measuring 29 x 30 mm (image 6).  There are enlarged round abnormal lymph nodes in the left axilla measuring up to 20 mm short axis (image 22).  Lymph nodes extend beneath the sub pectoralis muscles on the left (image 12).  There is no evidence of internal mammary lymphadenopathy.  No infraclavicular adenopathy.  No mediastinal adenopathy.  No pericardial fluid.  No suspicious pulmonary nodules.  IMPRESSION:  1.  Left breast mass consistent with breast carcinoma. 2.  Skin thickening over the left breast is concerning for inflammatory breast cancer. 3.  Nodal metastasis in the left axilla and sub pectoralis nodal stations. 4.  No evidence of central nodal metastasis or mediastinal metastasis  CT ABDOMEN AND PELVIS  Findings:  No focal hepatic lesion.  The gallbladder, pancreas, spleen, adrenal gland is, and kidneys are normal.  The stomach, small bowel, appendix, cecum are normal.  The colon and rectosigmoid colon are normal.  Abdominal aorta is normal caliber.  No retroperitoneal or periportal lymphadenopathy.  No mesenteric or peritoneal disease.  No free fluid in  the pelvis.  Post hysterectomy anatomy.  No pelvic lymphadenopathy. Review of  bone windows demonstrates no aggressive osseous lesions.  IMPRESSION:  No evidence of metastasis in the abdomen or pelvis.   Original Report Authenticated By: Genevive Bi, M.D.    Ct Abdomen Pelvis W Contrast  10/27/2012  *RADIOLOGY REPORT*  Clinical Data:  High risk breast cancer staging.  Right breast cancer diagnosed 1988.  Left breast  cancer diagnosed February 2014.  CT CHEST, ABDOMEN AND PELVIS WITH CONTRAST  Technique:  Multidetector CT imaging of the chest, abdomen and pelvis was performed following the standard protocol during bolus administration of intravenous contrast.  Contrast: OMNIPAQUE IOHEXOL 300 MG/ML  SOLN  Comparison:  PET CT scan 03/26 1014  CT CHEST  Findings:  Right breast mastectomy anatomy.  There is thickening of the skin of the left breast to 11 mm.  There is a mass within the deep posterior left breast measuring 29 x 30 mm (image 6).  There are enlarged round abnormal lymph nodes in the left axilla measuring up to 20 mm short axis (image 22).  Lymph nodes extend beneath the sub pectoralis muscles on the left (image 12).  There is no evidence of internal mammary lymphadenopathy.  No infraclavicular adenopathy.  No mediastinal adenopathy.  No pericardial fluid.  No suspicious pulmonary nodules.  IMPRESSION:  1.  Left breast mass consistent with breast carcinoma. 2.  Skin thickening over the left breast is concerning for inflammatory breast cancer. 3.  Nodal metastasis in the left axilla and sub pectoralis nodal stations. 4.  No evidence of central nodal metastasis or mediastinal metastasis  CT ABDOMEN AND PELVIS  Findings:  No focal hepatic lesion.  The gallbladder, pancreas, spleen, adrenal gland is, and kidneys are normal.  The stomach, small bowel, appendix, cecum are normal.  The colon and rectosigmoid colon are normal.  Abdominal aorta is normal caliber.  No retroperitoneal or periportal lymphadenopathy.  No mesenteric or peritoneal disease.  No free fluid in  the pelvis.  Post hysterectomy anatomy.  No pelvic lymphadenopathy. Review of  bone windows demonstrates no aggressive osseous lesions.  IMPRESSION:  No evidence of metastasis in the abdomen or pelvis.   Original Report Authenticated By: Genevive Bi, M.D.    Mr Breast Bilateral W Wo Contrast  10/12/2012  *RADIOLOGY REPORT*  Clinical Data: History of  previous malignant mastectomy of the right breast 25 years ago.  The patient developed diffuse skin thickening and firmness within the left breast.  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  BUN and creatinine were obtained on site at [facility] at [address] Results:  BUN 7 mg/dL,  Creatinine 1.0 mg/dL.  BILATERAL BREAST MRI WITH AND WITHOUT CONTRAST  Technique: Multiplanar, multisequence MR images of both breasts were obtained prior to and following the intravenous administration of 20ml of Multihance.  Three dimensional images were evaluated at the independent DynaCad workstation.  Comparison:  Mammograms dated 09/30/2012 and 09/28/2012.  Findings: There has been a previous right mastectomy.  There is marked diffuse enhancement involving the majority of the left breast.  This measures 13.0 x 12.5 x 9.5 cm in size.  This is associated with a mixture of plateau and washout enhancement kinetics.  In addition,  there is diffuse skin thickening and dermal enhancement.  There are multiple enlarged level I left axillary lymph nodes.  The largest lymph node measures 2.4 cm in size.  The large area of enhancement within the left breast extends posteriorly to abut the anterior portion of the pectoral muscle. There is no evidence for internal mammary adenopathy or right axillary adenopathy.  There are no additional findings.  IMPRESSION:  1.  Large ( 13 cm) area of enhancement involving the majority of the left breast consistent with the patient's known invasive ductal carcinoma and DCIS. This extends posteriorly to abut the pectoralis muscle. 2.  Multiple enlarged level I left axillary lymph nodes. 3.  Previous right mastectomy.  RECOMMENDATION: Treatment plan  THREE-DIMENSIONAL MR IMAGE RENDERING ON INDEPENDENT WORKSTATION:  Three-dimensional MR images were rendered by  post-processing of the original MR data on an independent workstation.  The three- dimensional MR images were interpreted, and findings were reported in the accompanying complete MRI report for this study.  BI-RADS CATEGORY 6:  Known biopsy-proven malignancy - appropriate action should be taken.   Original Report Authenticated By: Rolla Plate, M.D.    Nm Pet Image Initial (pi) Skull Base To Thigh  10/27/2012  *RADIOLOGY REPORT*  Clinical Data: Initial treatment strategy for high risk of breast cancer. Triple negative  NUCLEAR MEDICINE PET SKULL BASE TO THIGH  Fasting Blood Glucose:  97  Technique:  17.9 mCi F-18 FDG was injected intravenously. CT data was obtained and used for attenuation correction and anatomic localization only.  (This was not acquired as a diagnostic CT examination.) Additional exam technical data entered on technologist worksheet.  Comparison:  Findings:  Neck: No hypermetabolic lymph nodes in the neck.  Chest:  There is intense metabolic activity ( SUV max = 16.1) associated with a large portion of the glandular tissue of the left breast consistent with carcinoma.  There are intensely hypermetabolic left axillary lymph nodes with SUV max = 15.1.  These nodes are rounded and measure up to 2 cm. Hypermetabolic nodes extend to the sub pectoralis nodal station (image 71).  There is a single hypermetabolic internal mammary lymph node measuring 5 mm just left of the sternum (image 87).  This has intense metabolic activity for size with SUV max = 7.1.  No hypermetabolic mediastinal lymph nodes.  No hypermetabolic pulmonary nodules.  Abdomen/Pelvis:  No abnormal hypermetabolic activity within the liver, pancreas, adrenal glands, or spleen.  No hypermetabolic lymph nodes in the abdomen or pelvis.  Skeleton:  No focal hypermetabolic activity to suggest skeletal metastasis.  IMPRESSION:  1.  Hypermetabolic breast tissue on the left consistent with primary carcinoma. 2.  Hypermetabolic nodal  metastasis in the left axilla extending to the sub pectoralis nodes. 3.  Single hypermetabolic left internal mammary lymph node.  4.  No evidence of pulmonary metastasis.  5.  No evidence of metastasis beneath hemidiaphragms or within the skeleton.   Original Report Authenticated By: Genevive Bi, M.D.    Dg Chest Port 1 View  10/28/2012  *RADIOLOGY REPORT*  Clinical Data: Port-A-Cath placement.  PORTABLE CHEST - 1 VIEW  Comparison: None.  Findings: The patient is rotated on the study.  Right side Port-A- Cath is in place.  Tubing appears intact.  Tip of the catheter projects in the mid to lower superior vena cava.  No pneumothorax identified.  Heart size is normal.  Lungs are clear.  The patient is status post right mastectomy and axillary dissection.  IMPRESSION: Tip of Port-A-Cath projects over the mid to lower superior vena cava.  The patient is rotated on the study.  Exact position of the tip of the catheter could be determined with the lateral film. No pneumothorax.   Original Report Authenticated By: Holley Dexter, M.D.    Dg Fluoro Guide Cv Line-no Report  10/28/2012  CLINICAL DATA: [facility]   FLOURO GUIDE CV LINE  Fluoroscopy was utilized by the requesting physician.  No radiographic  interpretation.      ASSESSMENT:   57 year old female with  #1 history of breast cancer of the right breast status post mastectomy when she was 57 years old. Due to this she was referred for genetic testing.   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 Patient's tumor is ER negative PR negative HER-2/neu negative with a Ki-67 of 21%. Patient is being seen in medical oncology for neoadjuvant chemotherapy. Patient and I discussed her pathology in detail. We discussed treatment for breast cancer including surgical and with chemotherapy. Patient understands that she may still need a mastectomy however by  giving her neoadjuvant chemotherapy her surgery may be made a little bit easier hopefully with giving her negative margins. She understands that neoadjuvant chemotherapy will also treat her distant disease.I discussed type of chemotherapy that she should have. This would include dose dense FEC x6 cycles followed by weekly Taxol carboplatinum for 12 weeks.  Currently due to receive Taxol carbo week 7.    #3 Patient underwent staging studies with PET/CT, and received echo, port placement and chemotherapy class.    #4 Hypokalemia  PLAN:   1. Ms. Biancardi does not look well, due to her appearance we will give her 2 liters of IV fluids today.  She will not receive chemotherapy.     2. Her potassium was 3.1 and we will increase her potassium to 40 meq po BID.  Her blood sugar is elevated, and therefore we will fax these to her PCP.  3. She will return in 1 week for labs and evaluation for cycle 8 of treatment.    All questions were answered. The patient knows to call the clinic with any problems, questions or concerns. We can certainly see the patient much sooner if necessary.  I spent 25 minutes counseling the patient face to face. The total time spent in the appointment was 30 minutes.  Cherie Ouch Lyn Hollingshead, NP Medical Oncology [facility] Phone: [phone] 04/12/2013, 2:59 PM

## 2013-04-12 NOTE — Telephone Encounter (Signed)
PT. IS EXTREMELY FATIGUED. FLUID INTAKE IN THE PAST 24 HOURS HAS BEEN 40 OUNCES. NO NAUSEA OR VOMITING. HER STOMACH IS MAKING GURGLING NOISES. TEMPERATURE HAS BEEN 99.0. PT. HAS HAD FIVE DIARRHEA STOOLS IN THE PAST 24 HOURS. TALKED WITH PT. ABOUT TAKING FREQUENT REST PERIODS AND TO ALLOW PEOPLE TO HELP HER. DISCUSSED OPTIONS TO INCREASE FLUID INTAKE. INSTRUCTED PT. TO USE IMODIUM FOR HER DIARRHEA. SHE VOICES UNDERSTANDING. PT. WILL KEEP HER APPOINTMENT TODAY.

## 2013-04-13 NOTE — Telephone Encounter (Signed)
Thanks Neilsa.  Christine [name] was very appreciative of how encouraging you were during the visit.  You are awesome!  [name]

## 2013-04-15 ENCOUNTER — Other Ambulatory Visit: Payer: Self-pay | Admitting: *Deleted

## 2013-04-15 DIAGNOSIS — C50919 Malignant neoplasm of unspecified site of unspecified female breast: Secondary | ICD-10-CM

## 2013-04-15 MED ORDER — GABAPENTIN 300 MG PO CAPS: 300.0000 mg | ORAL_CAPSULE | Freq: Three times a day (TID) | ORAL | Status: DC

## 2013-04-15 NOTE — Telephone Encounter (Signed)
Taking 300 mg gabapentin tid ( # 3 100 mg capsules) and refill request sent for 90 day supply. Approved 90 day supply of 300 mg gababentin with no additional refills.

## 2013-04-16 ENCOUNTER — Ambulatory Visit
Admission: RE | Admit: 2013-04-16 | Discharge: 2013-04-16 | Disposition: A | Discharge status: Home / Self Care | Payer: BC Managed Care – PPO | Source: Ambulatory Visit | Attending: Adult Health | Admitting: Adult Health

## 2013-04-16 DIAGNOSIS — C50912 Malignant neoplasm of unspecified site of left female breast: Secondary | ICD-10-CM

## 2013-04-19 ENCOUNTER — Ambulatory Visit (HOSPITAL_BASED_OUTPATIENT_CLINIC_OR_DEPARTMENT_OTHER): Payer: BC Managed Care – PPO | Admitting: Adult Health

## 2013-04-19 ENCOUNTER — Ambulatory Visit (HOSPITAL_BASED_OUTPATIENT_CLINIC_OR_DEPARTMENT_OTHER): Payer: BC Managed Care – PPO

## 2013-04-19 ENCOUNTER — Encounter: Payer: Self-pay | Admitting: Adult Health

## 2013-04-19 ENCOUNTER — Other Ambulatory Visit (HOSPITAL_BASED_OUTPATIENT_CLINIC_OR_DEPARTMENT_OTHER): Payer: BC Managed Care – PPO

## 2013-04-19 VITALS — BP 125/87 | HR 97 | Temp 98.1°F | Resp 18 | Ht 64.0 in | Wt 207.2 lb

## 2013-04-19 DIAGNOSIS — E876 Hypokalemia: Secondary | ICD-10-CM

## 2013-04-19 DIAGNOSIS — C50912 Malignant neoplasm of unspecified site of left female breast: Secondary | ICD-10-CM

## 2013-04-19 DIAGNOSIS — C773 Secondary and unspecified malignant neoplasm of axilla and upper limb lymph nodes: Secondary | ICD-10-CM

## 2013-04-19 DIAGNOSIS — C50919 Malignant neoplasm of unspecified site of unspecified female breast: Secondary | ICD-10-CM

## 2013-04-19 DIAGNOSIS — Z5111 Encounter for antineoplastic chemotherapy: Secondary | ICD-10-CM

## 2013-04-19 LAB — COMPREHENSIVE METABOLIC PANEL (CC13)
CO2: 28 mEq/L (ref 22–29)
Calcium: 9 mg/dL (ref 8.4–10.4)
Chloride: 102 mEq/L (ref 98–109)
Creatinine: 0.8 mg/dL (ref 0.6–1.1)
Glucose: 226 mg/dl — ABNORMAL HIGH (ref 70–140)
Total Bilirubin: 0.38 mg/dL (ref 0.20–1.20)

## 2013-04-19 LAB — CBC WITH DIFFERENTIAL/PLATELET
Basophils Absolute: 0 10*3/uL (ref 0.0–0.1)
Eosinophils Absolute: 0 10*3/uL (ref 0.0–0.5)
HCT: 26 % — ABNORMAL LOW (ref 34.8–46.6)
HGB: 8.4 g/dL — ABNORMAL LOW (ref 11.6–15.9)
LYMPH%: 36.2 % (ref 14.0–49.7)
MCV: 97 fL (ref 79.5–101.0)
MONO%: 9.9 % (ref 0.0–14.0)
NEUT#: 1.7 10*3/uL (ref 1.5–6.5)
NEUT%: 53.6 % (ref 38.4–76.8)
Platelets: 106 10*3/uL — ABNORMAL LOW (ref 145–400)

## 2013-04-19 MED ORDER — DEXAMETHASONE SODIUM PHOSPHATE 20 MG/5ML IJ SOLN
INTRAMUSCULAR | Status: AC
  Filled 2013-04-19: qty 5

## 2013-04-19 MED ORDER — ONDANSETRON 16 MG/50ML IVPB (CHCC)
INTRAVENOUS | Status: AC
  Filled 2013-04-19: qty 16

## 2013-04-19 MED ORDER — PACLITAXEL CHEMO INJECTION 300 MG/50ML
80.0000 mg/m2 | Freq: Once | INTRAVENOUS | Status: AC
  Administered 2013-04-19: 162 mg via INTRAVENOUS
  Filled 2013-04-19: qty 27

## 2013-04-19 MED ORDER — DIPHENHYDRAMINE HCL 50 MG/ML IJ SOLN
INTRAMUSCULAR | Status: AC
  Filled 2013-04-19: qty 1

## 2013-04-19 MED ORDER — FAMOTIDINE IN NACL 20-0.9 MG/50ML-% IV SOLN
20.0000 mg | Freq: Once | INTRAVENOUS | Status: AC
  Administered 2013-04-19: 20 mg via INTRAVENOUS

## 2013-04-19 MED ORDER — HEPARIN SOD (PORK) LOCK FLUSH 100 UNIT/ML IV SOLN
500.0000 [IU] | Freq: Once | INTRAVENOUS | Status: AC | PRN
  Administered 2013-04-19: 500 [IU]
  Filled 2013-04-19: qty 5

## 2013-04-19 MED ORDER — ONDANSETRON 16 MG/50ML IVPB (CHCC)
16.0000 mg | Freq: Once | INTRAVENOUS | Status: AC
  Administered 2013-04-19: 16 mg via INTRAVENOUS

## 2013-04-19 MED ORDER — SODIUM CHLORIDE 0.9 % IV SOLN
220.0000 mg | Freq: Once | INTRAVENOUS | Status: AC
  Administered 2013-04-19: 220 mg via INTRAVENOUS
  Filled 2013-04-19: qty 22

## 2013-04-19 MED ORDER — DIPHENHYDRAMINE HCL 50 MG/ML IJ SOLN
50.0000 mg | Freq: Once | INTRAMUSCULAR | Status: AC
  Administered 2013-04-19: 50 mg via INTRAVENOUS

## 2013-04-19 MED ORDER — SODIUM CHLORIDE 0.9 % IV SOLN
Freq: Once | INTRAVENOUS | Status: AC
  Administered 2013-04-19: 14:00:00 via INTRAVENOUS

## 2013-04-19 MED ORDER — DEXAMETHASONE SODIUM PHOSPHATE 20 MG/5ML IJ SOLN
20.0000 mg | Freq: Once | INTRAMUSCULAR | Status: AC
  Administered 2013-04-19: 20 mg via INTRAVENOUS

## 2013-04-19 MED ORDER — FAMOTIDINE IN NACL 20-0.9 MG/50ML-% IV SOLN
INTRAVENOUS | Status: AC
  Filled 2013-04-19: qty 50

## 2013-04-19 MED ORDER — SODIUM CHLORIDE 0.9 % IJ SOLN
10.0000 mL | INTRAMUSCULAR | Status: DC | PRN
  Administered 2013-04-19: 10 mL
  Filled 2013-04-19: qty 10

## 2013-04-19 NOTE — Progress Notes (Signed)
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   PRIOR THERAPY:  1. Patient has history of right breast cancer patient underwent a mastectomy when she was 57 years old. This occurred during her pregnancy. After that patient did not take any kind of further treatments. She continued to do well.   2.Recently she noted changes in her nipple had become itchy and retracted. Because of this she underwent mammogram with ultrasound that showed 2 areas in the left breast one at 12:00 position and a second at 3:00 position. She also had an ultrasound performed that showed the diameter to be at least 6 cm and multiple left axillary lymph nodes that were abnormal. She then went on to have a biopsy performed on 09/30/2012. 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 Patient went on to have MRIs of the breasts performed and was noted to be marked diffuse enhancement involving majority of the left breast measuring 13.0 x 12.5 x 9.5 cm. There was diffuse skin thickening and dermal enhancement. Multiple enlarged level I left axillary lymph nodes. The largest lymph node measured 2.4 cm in size. The large area of enhancement within the left breast extends posteriorly to abut the anterior portion of the pectoralis muscle. There was no evidence of internal mammary adenopathy or right axillary adenopathy.  PET/CT did show the left breast mass, and left axillary and subpectoralis nodal metastases, but no further areas throughout the chest/abd/pelvis of metastases.   3.  Patient currently undergoing  neoadjuvant chemotherapy with FEC x 6 cycles starting on 11/02/12, which will be followed by 12 cycles of weekly Taxol/Carbo that started on 02/08/13.  CURRENT THERAPY: Taxol Carbo week 8  INTERVAL HISTORY: Christine [name] 57 y.o. female returns for follow up today prior to chemotherapy.  She is doing well today.  She denies fevers, chills, nausea, vomiting, constipation.  She was feeling poorly last week with diarrhea, but it has since resolved and she is feeling much improved.  She continues to take Gabapentin for her numbness in her fingertips and toes.  She denies any motor difficulties such as buttoning, walking, balancing.  Otherwise, she is w/o questions or concerns.    MEDICAL HISTORY: Past Medical History  Diagnosis Date  . ALLERGIC RHINITIS   . GERD (gastroesophageal reflux disease)   . Hypertension   . Hypothyroidism   . Hx of colonic polyps   . Wears glasses   . Breast cancer 64, age 86    right  . Breast cancer 09/30/12    left, ER/PR -, Her 2 -    ALLERGIES:  is allergic to sulfa antibiotics and sulfonamide derivatives.  MEDICATIONS:  Current Outpatient Prescriptions  Medication Sig Dispense Refill  . dexamethasone (DECADRON) 4 MG tablet TAKE 2 TABS PO QD ON DAY AFTER CHEMO THEN 2 TABS BID X 2 DAYS  30 tablet  1  . diltiazem (CARDIZEM CD) 240 MG 24 hr capsule TAKE ONE CAPSULE BY MOUTH EVERY DAY  90 capsule  3  . fluconazole (DIFLUCAN) 200 MG tablet Take 1 tablet (200 mg total) by mouth every other day.  10  tablet  2  . fluticasone (FLONASE) 50 MCG/ACT nasal spray Place 2 sprays into the nose daily.  16 g  2  . gabapentin (NEURONTIN) 300 MG capsule Take 1 capsule (300 mg total) by mouth 3 (three) times daily.  270 capsule  0  . latanoprost (XALATAN) 0.005 % ophthalmic solution Place 1 drop into both eyes at bedtime.       Marland Kitchen levothyroxine (SYNTHROID, LEVOTHROID) 50 MCG tablet TAKE 1 TABLET BY MOUTH EVERY DAY  90 tablet  2  . lidocaine-prilocaine (EMLA) cream Apply topically  as needed.  30 g  6  . losartan-hydrochlorothiazide (HYZAAR) 50-12.5 MG per tablet Take 1 tablet by mouth daily.  30 tablet  3  . NEXIUM 40 MG capsule TAKE 1 CAPSULE (40 MG TOTAL) BY MOUTH DAILY BEFORE BREAKFAST.  90 capsule  1  . nystatin (MYCOSTATIN) 100000 UNIT/ML suspension Take 5 mLs (500,000 Units total) by mouth 4 (four) times daily.  240 mL  1  . potassium chloride SA (K-DUR,KLOR-CON) 20 MEQ tablet Take 2 tablets (40 mEq total) by mouth 2 (two) times daily.  120 tablet  0  . LORazepam (ATIVAN) 0.5 MG tablet Take 1 tablet (0.5 mg total) by mouth every 6 (six) hours as needed (Nausea or vomiting).  30 tablet  0   No current facility-administered medications for this visit.    SURGICAL HISTORY:  Past Surgical History  Procedure Laterality Date  . Tonsillectomy    . Caesarean section      x 1  . Mastectomy  04/1987    right side  . Abdominal hysterectomy  06/1988    fibroids  . Portacath placement Right 10/28/2012    Procedure: PORT PLACEMENT;  Surgeon: Clovis Pu. Cornett, MD;  Location: [facility];  Service: General;  Laterality: Right;  Right Subclavian Vein    REVIEW OF SYSTEMS:  A 10 point review of systems was conducted and is otherwise negative except for what is noted above.     PHYSICAL EXAMINATION: Blood pressure 125/87, pulse 97, temperature 98.1 F (36.7 C), temperature source Oral, resp. rate 18, height 5\' 4"  (1.626 m), weight 207 lb 3.2 oz (93.985 kg), SpO2 96.00%. Body mass index is 35.55 kg/(m^2). General: Patient is a pale appearing female in no acute distress HEENT: PERRLA, sclerae anicteric no conjunctival pallor, MMM. Neck: supple, no palpable adenopathy Lungs: clear to auscultation bilaterally, no wheezes, rhonchi, or rales Cardiovascular: regular rate rhythm, S1, S2, no murmurs, rubs or gallops Abdomen: Soft, non-tender, non-distended, normoactive bowel sounds, no HSM Extremities: warm and well perfused, no clubbing, cyanosis, or edema Skin:  No rashes or lesions Neuro: Non-focal Breasts: Right mastectomy site without nodularity or sign of recurrence.  Left breast mass palpable along with dimpling of skin and inversion of the nipple--much softer and smaller.  ECOG PERFORMANCE STATUS: 1 - Symptomatic but completely ambulatory  LABORATORY DATA: Lab Results  Component Value Date   WBC 3.1* 04/19/2013   HGB 8.4* 04/19/2013   HCT 26.0* 04/19/2013   MCV 97.0 04/19/2013   PLT 106* 04/19/2013      Chemistry      Component Value Date/Time   NA 141 04/19/2013 1234   NA 144 10/28/2012 1248   K 3.4* 04/19/2013 1234   K 3.9 10/28/2012 1248   CL 101 01/25/2013 0952   CL 108 10/28/2012 1248   CO2 28 04/19/2013 1234   CO2 29 09/13/2011 1910   BUN 11.9 04/19/2013 1234   BUN 10 10/28/2012 1248  CREATININE 0.8 04/19/2013 1234   CREATININE 0.80 10/28/2012 1248      Component Value Date/Time   CALCIUM 9.0 04/19/2013 1234   CALCIUM 9.4 09/13/2011 1910   ALKPHOS 95 04/19/2013 1234   ALKPHOS 78 09/13/2011 1910   AST 23 04/19/2013 1234   AST 18 09/13/2011 1910   ALT 21 04/19/2013 1234   ALT 12 09/13/2011 1910   BILITOT 0.38 04/19/2013 1234   BILITOT 0.3 09/13/2011 1910       RADIOGRAPHIC STUDIES:  Ct Chest W Contrast  10/27/2012  *RADIOLOGY REPORT*  Clinical Data:  High risk breast cancer staging.  Right breast cancer diagnosed 1988.  Left breast cancer diagnosed February 2014.  CT CHEST, ABDOMEN AND PELVIS WITH CONTRAST  Technique:  Multidetector CT imaging of the chest, abdomen and pelvis was performed following the standard protocol during bolus administration of intravenous contrast.  Contrast: OMNIPAQUE IOHEXOL 300 MG/ML  SOLN  Comparison:  PET CT scan 03/26 1014  CT CHEST  Findings:  Right breast mastectomy anatomy.  There is thickening of the skin of the left breast to 11 mm.  There is a mass within the deep posterior left breast measuring 29 x 30 mm (image 6).  There are enlarged round abnormal lymph nodes in the left axilla measuring up to 20 mm  short axis (image 22).  Lymph nodes extend beneath the sub pectoralis muscles on the left (image 12).  There is no evidence of internal mammary lymphadenopathy.  No infraclavicular adenopathy.  No mediastinal adenopathy.  No pericardial fluid.  No suspicious pulmonary nodules.  IMPRESSION:  1.  Left breast mass consistent with breast carcinoma. 2.  Skin thickening over the left breast is concerning for inflammatory breast cancer. 3.  Nodal metastasis in the left axilla and sub pectoralis nodal stations. 4.  No evidence of central nodal metastasis or mediastinal metastasis  CT ABDOMEN AND PELVIS  Findings:  No focal hepatic lesion.  The gallbladder, pancreas, spleen, adrenal gland is, and kidneys are normal.  The stomach, small bowel, appendix, cecum are normal.  The colon and rectosigmoid colon are normal.  Abdominal aorta is normal caliber.  No retroperitoneal or periportal lymphadenopathy.  No mesenteric or peritoneal disease.  No free fluid in  the pelvis.  Post hysterectomy anatomy.  No pelvic lymphadenopathy. Review of  bone windows demonstrates no aggressive osseous lesions.  IMPRESSION:  No evidence of metastasis in the abdomen or pelvis.   Original Report Authenticated By: Genevive Bi, M.D.    Ct Abdomen Pelvis W Contrast  10/27/2012  *RADIOLOGY REPORT*  Clinical Data:  High risk breast cancer staging.  Right breast cancer diagnosed 1988.  Left breast cancer diagnosed February 2014.  CT CHEST, ABDOMEN AND PELVIS WITH CONTRAST  Technique:  Multidetector CT imaging of the chest, abdomen and pelvis was performed following the standard protocol during bolus administration of intravenous contrast.  Contrast: OMNIPAQUE IOHEXOL 300 MG/ML  SOLN  Comparison:  PET CT scan 03/26 1014  CT CHEST  Findings:  Right breast mastectomy anatomy.  There is thickening of the skin of the left breast to 11 mm.  There is a mass within the deep posterior left breast measuring 29 x 30 mm (image 6).  There are enlarged  round abnormal lymph nodes in the left axilla measuring up to 20 mm short axis (image 22).  Lymph nodes extend beneath the sub pectoralis muscles on the left (image 12).  There is no evidence of internal mammary lymphadenopathy.  No  infraclavicular adenopathy.  No mediastinal adenopathy.  No pericardial fluid.  No suspicious pulmonary nodules.  IMPRESSION:  1.  Left breast mass consistent with breast carcinoma. 2.  Skin thickening over the left breast is concerning for inflammatory breast cancer. 3.  Nodal metastasis in the left axilla and sub pectoralis nodal stations. 4.  No evidence of central nodal metastasis or mediastinal metastasis  CT ABDOMEN AND PELVIS  Findings:  No focal hepatic lesion.  The gallbladder, pancreas, spleen, adrenal gland is, and kidneys are normal.  The stomach, small bowel, appendix, cecum are normal.  The colon and rectosigmoid colon are normal.  Abdominal aorta is normal caliber.  No retroperitoneal or periportal lymphadenopathy.  No mesenteric or peritoneal disease.  No free fluid in  the pelvis.  Post hysterectomy anatomy.  No pelvic lymphadenopathy. Review of  bone windows demonstrates no aggressive osseous lesions.  IMPRESSION:  No evidence of metastasis in the abdomen or pelvis.   Original Report Authenticated By: Genevive Bi, M.D.    Mr Breast Bilateral W Wo Contrast  10/12/2012  *RADIOLOGY REPORT*  Clinical Data: History of previous malignant mastectomy of the right breast 25 years ago.  The patient developed diffuse skin thickening and firmness within the left breast.  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  BUN and creatinine were obtained on site at [facility] at [address] Results:  BUN 7 mg/dL,  Creatinine 1.0 mg/dL.  BILATERAL BREAST MRI WITH AND WITHOUT CONTRAST  Technique: Multiplanar, multisequence MR images of both  breasts were obtained prior to and following the intravenous administration of 20ml of Multihance.  Three dimensional images were evaluated at the independent DynaCad workstation.  Comparison:  Mammograms dated 09/30/2012 and 09/28/2012.  Findings: There has been a previous right mastectomy.  There is marked diffuse enhancement involving the majority of the left breast.  This measures 13.0 x 12.5 x 9.5 cm in size.  This is associated with a mixture of plateau and washout enhancement kinetics.  In addition,  there is diffuse skin thickening and dermal enhancement.  There are multiple enlarged level I left axillary lymph nodes.  The largest lymph node measures 2.4 cm in size.  The large area of enhancement within the left breast extends posteriorly to abut the anterior portion of the pectoral muscle. There is no evidence for internal mammary adenopathy or right axillary adenopathy.  There are no additional findings.  IMPRESSION:  1.  Large ( 13 cm) area of enhancement involving the majority of the left breast consistent with the patient's known invasive ductal carcinoma and DCIS. This extends posteriorly to abut the pectoralis muscle. 2.  Multiple enlarged level I left axillary lymph nodes. 3.  Previous right mastectomy.  RECOMMENDATION: Treatment plan  THREE-DIMENSIONAL MR IMAGE RENDERING ON INDEPENDENT WORKSTATION:  Three-dimensional MR images were rendered by post-processing of the original MR data on an independent workstation.  The three- dimensional MR images were interpreted, and findings were reported in the accompanying complete MRI report for this study.  BI-RADS CATEGORY 6:  Known biopsy-proven malignancy - appropriate action should be taken.   Original Report Authenticated By: Rolla Plate, M.D.    Nm Pet Image Initial (pi) Skull Base To Thigh  10/27/2012  *RADIOLOGY REPORT*  Clinical Data: Initial treatment strategy for high risk of breast cancer. Triple negative  NUCLEAR MEDICINE PET SKULL BASE TO  THIGH  Fasting Blood Glucose:  97  Technique:  17.9 mCi F-18 FDG was injected intravenously. CT data was obtained and used for attenuation correction and anatomic localization only.  (This was not acquired as a diagnostic CT examination.) Additional exam technical data entered on technologist worksheet.  Comparison:  Findings:  Neck: No hypermetabolic lymph nodes in the neck.  Chest:  There is intense metabolic activity ( SUV max = 08.6) associated with a large portion of the glandular tissue of the left breast consistent with carcinoma.  There are intensely hypermetabolic left axillary lymph nodes with SUV max = 15.1.  These nodes are rounded and measure up to 2 cm. Hypermetabolic nodes extend to the sub pectoralis nodal station (image 71).  There is a single hypermetabolic internal mammary lymph node measuring 5 mm just left of the sternum (image 87).  This has intense metabolic activity for size with SUV max = 7.1.  No hypermetabolic mediastinal lymph nodes.  No hypermetabolic pulmonary nodules.  Abdomen/Pelvis:  No abnormal hypermetabolic activity within the liver, pancreas, adrenal glands, or spleen.  No hypermetabolic lymph nodes in the abdomen or pelvis.  Skeleton:  No focal hypermetabolic activity to suggest skeletal metastasis.  IMPRESSION:  1.  Hypermetabolic breast tissue on the left consistent with primary carcinoma. 2.  Hypermetabolic nodal metastasis in the left axilla extending to the sub pectoralis nodes. 3.  Single hypermetabolic left internal mammary lymph node.  4.  No evidence of pulmonary metastasis.  5.  No evidence of metastasis beneath hemidiaphragms or within the skeleton.   Original Report Authenticated By: Genevive Bi, M.D.    Dg Chest Port 1 View  10/28/2012  *RADIOLOGY REPORT*  Clinical Data: Port-A-Cath placement.  PORTABLE CHEST - 1 VIEW  Comparison: None.  Findings: The patient is rotated on the study.  Right side Port-A- Cath is in place.  Tubing appears intact.  Tip of the  catheter projects in the mid to lower superior vena cava.  No pneumothorax identified.  Heart size is normal.  Lungs are clear.  The patient is status post right mastectomy and axillary dissection.  IMPRESSION: Tip of Port-A-Cath projects over the mid to lower superior vena cava.  The patient is rotated on the study.  Exact position of the tip of the catheter could be determined with the lateral film. No pneumothorax.   Original Report Authenticated By: Holley Dexter, M.D.    Dg Fluoro Guide Cv Line-no Report  10/28/2012  CLINICAL DATA: [facility]   FLOURO GUIDE CV LINE  Fluoroscopy was utilized by the requesting physician.  No radiographic  interpretation.      ASSESSMENT:   57 year old female with  #1 history of breast cancer of the right breast status post mastectomy when she was 57 years old. Due to this she was referred for genetic testing.   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 Patient's tumor is ER negative PR negative HER-2/neu negative with a Ki-67 of 21%. Patient is being seen in medical oncology for neoadjuvant chemotherapy. Patient's pathology was discussed with her in detail, as well as treatment for breast cancer including surgical and with chemotherapy. Patient understands that she may still need a mastectomy however by giving her neoadjuvant chemotherapy her surgery may be made a little bit easier hopefully with giving her negative margins. She understands that neoadjuvant chemotherapy will also treat her distant disease.The type of chemotherapy she should have was discussed.  This would include dose dense FEC x6 cycles followed by weekly Taxol carboplatinum for 12 weeks.  Currently due to receive Taxol carbo week 8.    #3 Patient underwent staging studies with PET/CT, and received echo, port placement and chemotherapy class.    #4 Hypokalemia  PLAN:   1.Doing well.   Patient will proceed with chemotherapy today.   2.  I reviewed her labs with her in detail.    3. She will return in one week for week 9 of taxol/carbo.  She has MRI breasts scheduled on 04/26/13.    4.  Will f/u on her pending potassium level and call if she needs to change her potassium supplementation.  All questions were answered. The patient knows to call the clinic with any problems, questions or concerns. We can certainly see the patient much sooner if necessary.  I spent 25 minutes counseling the patient face to face. The total time spent in the appointment was 30 minutes.  Cherie Ouch Lyn Hollingshead, NP Medical Oncology [facility] Phone: [phone] 04/20/2013, 10:52 AM

## 2013-04-19 NOTE — Patient Instructions (Addendum)
Doing well.  Proceed with chemotherapy.  Please call us if you have any questions or concerns.    We will see you back next week.   

## 2013-04-26 ENCOUNTER — Other Ambulatory Visit (HOSPITAL_BASED_OUTPATIENT_CLINIC_OR_DEPARTMENT_OTHER): Payer: BC Managed Care – PPO | Admitting: Lab

## 2013-04-26 ENCOUNTER — Telehealth: Payer: Self-pay | Admitting: *Deleted

## 2013-04-26 ENCOUNTER — Other Ambulatory Visit: Payer: Self-pay | Admitting: Medical Oncology

## 2013-04-26 ENCOUNTER — Ambulatory Visit (HOSPITAL_BASED_OUTPATIENT_CLINIC_OR_DEPARTMENT_OTHER): Payer: BC Managed Care – PPO | Admitting: Adult Health

## 2013-04-26 ENCOUNTER — Ambulatory Visit (HOSPITAL_BASED_OUTPATIENT_CLINIC_OR_DEPARTMENT_OTHER): Payer: BC Managed Care – PPO

## 2013-04-26 ENCOUNTER — Encounter: Payer: Self-pay | Admitting: Adult Health

## 2013-04-26 VITALS — BP 128/83 | HR 114 | Temp 98.4°F | Resp 20 | Ht 64.0 in | Wt 197.8 lb

## 2013-04-26 DIAGNOSIS — Z171 Estrogen receptor negative status [ER-]: Secondary | ICD-10-CM

## 2013-04-26 DIAGNOSIS — C773 Secondary and unspecified malignant neoplasm of axilla and upper limb lymph nodes: Secondary | ICD-10-CM

## 2013-04-26 DIAGNOSIS — C50912 Malignant neoplasm of unspecified site of left female breast: Secondary | ICD-10-CM

## 2013-04-26 DIAGNOSIS — R42 Dizziness and giddiness: Secondary | ICD-10-CM

## 2013-04-26 DIAGNOSIS — R5381 Other malaise: Secondary | ICD-10-CM

## 2013-04-26 DIAGNOSIS — C50919 Malignant neoplasm of unspecified site of unspecified female breast: Secondary | ICD-10-CM

## 2013-04-26 DIAGNOSIS — E876 Hypokalemia: Secondary | ICD-10-CM

## 2013-04-26 DIAGNOSIS — Z853 Personal history of malignant neoplasm of breast: Secondary | ICD-10-CM

## 2013-04-26 LAB — CBC WITH DIFFERENTIAL/PLATELET
Basophils Absolute: 0 10*3/uL (ref 0.0–0.1)
EOS%: 0.4 % (ref 0.0–7.0)
HGB: 10.1 g/dL — ABNORMAL LOW (ref 11.6–15.9)
LYMPH%: 55.9 % — ABNORMAL HIGH (ref 14.0–49.7)
MCH: 31.9 pg (ref 25.1–34.0)
MCV: 93.1 fL (ref 79.5–101.0)
MONO%: 6.3 % (ref 0.0–14.0)
RDW: 16.8 % — ABNORMAL HIGH (ref 11.2–14.5)

## 2013-04-26 LAB — COMPREHENSIVE METABOLIC PANEL (CC13)
Alkaline Phosphatase: 87 U/L (ref 40–150)
BUN: 25.5 mg/dL (ref 7.0–26.0)
Creatinine: 1 mg/dL (ref 0.6–1.1)
Glucose: 331 mg/dl — ABNORMAL HIGH (ref 70–140)
Sodium: 136 mEq/L (ref 136–145)
Total Bilirubin: 0.47 mg/dL (ref 0.20–1.20)

## 2013-04-26 MED ORDER — GABAPENTIN 300 MG PO CAPS: 300.0000 mg | ORAL_CAPSULE | Freq: Three times a day (TID) | ORAL | Status: DC

## 2013-04-26 MED ORDER — SODIUM CHLORIDE 0.9 % IJ SOLN
10.0000 mL | INTRAMUSCULAR | Status: DC | PRN
  Administered 2013-04-26: 10 mL via INTRAVENOUS
  Filled 2013-04-26: qty 10

## 2013-04-26 MED ORDER — LORAZEPAM 0.5 MG PO TABS: 0.5000 mg | ORAL_TABLET | Freq: Four times a day (QID) | ORAL | Status: DC | PRN

## 2013-04-26 MED ORDER — HEPARIN SOD (PORK) LOCK FLUSH 100 UNIT/ML IV SOLN
500.0000 [IU] | Freq: Once | INTRAVENOUS | Status: AC
  Administered 2013-04-26: 500 [IU] via INTRAVENOUS
  Filled 2013-04-26: qty 5

## 2013-04-26 MED ORDER — SODIUM CHLORIDE 0.9 % IV SOLN
1000.0000 mL | Freq: Once | INTRAVENOUS | Status: AC
  Administered 2013-04-26: 1000 mL via INTRAVENOUS

## 2013-04-26 NOTE — Progress Notes (Signed)
No chemo today.  IVF only.  Per Dr. Welton Flakes

## 2013-04-26 NOTE — Patient Instructions (Signed)
Gemcitabine injection  What is this medicine?  GEMCITABINE (jem SIT a been) is a chemotherapy drug. This medicine is used to treat many types of cancer like breast cancer, lung cancer, pancreatic cancer, and ovarian cancer.  This medicine may be used for other purposes; ask your health care provider or pharmacist if you have questions.  What should I tell my health care provider before I take this medicine?  They need to know if you have any of these conditions:  -blood disorders  -infection  -kidney disease  -liver disease  -recent or ongoing radiation therapy  -an unusual or allergic reaction to gemcitabine, other chemotherapy, other medicines, foods, dyes, or preservatives  -pregnant or trying to get pregnant  -breast-feeding  How should I use this medicine?  This drug is given as an infusion into a vein. It is administered in a hospital or clinic by a specially trained health care professional.  Talk to your pediatrician regarding the use of this medicine in children. Special care may be needed.  Overdosage: If you think you have taken too much of this medicine contact a poison control center or emergency room at once.  NOTE: This medicine is only for you. Do not share this medicine with others.  What if I miss a dose?  It is important not to miss your dose. Call your doctor or health care professional if you are unable to keep an appointment.  What may interact with this medicine?  -medicines to increase blood counts like filgrastim, pegfilgrastim, sargramostim  -some other chemotherapy drugs like cisplatin  -vaccines  Talk to your doctor or health care professional before taking any of these medicines:  -acetaminophen  -aspirin  -ibuprofen  -ketoprofen  -naproxen  This list may not describe all possible interactions. Give your health care provider a list of all the medicines, herbs, non-prescription drugs, or dietary supplements you use. Also tell them if you smoke, drink alcohol, or use illegal drugs. Some  items may interact with your medicine.  What should I watch for while using this medicine?  Visit your doctor for checks on your progress. This drug may make you feel generally unwell. This is not uncommon, as chemotherapy can affect healthy cells as well as cancer cells. Report any side effects. Continue your course of treatment even though you feel ill unless your doctor tells you to stop.  In some cases, you may be given additional medicines to help with side effects. Follow all directions for their use.  Call your doctor or health care professional for advice if you get a fever, chills or sore throat, or other symptoms of a cold or flu. Do not treat yourself. This drug decreases your body's ability to fight infections. Try to avoid being around people who are sick.  This medicine may increase your risk to bruise or bleed. Call your doctor or health care professional if you notice any unusual bleeding.  Be careful brushing and flossing your teeth or using a toothpick because you may get an infection or bleed more easily. If you have any dental work done, tell your dentist you are receiving this medicine.  Avoid taking products that contain aspirin, acetaminophen, ibuprofen, naproxen, or ketoprofen unless instructed by your doctor. These medicines may hide a fever.  Women should inform their doctor if they wish to become pregnant or think they might be pregnant. There is a potential for serious side effects to an unborn child. Talk to your health care professional or pharmacist for   reactions like skin rash, itching or hives, swelling of the face, lips, or tongue -low blood counts - this medicine may decrease the number of white blood cells, red blood cells and  platelets. You may be at increased risk for infections and bleeding. -signs of infection - fever or chills, cough, sore throat, pain or difficulty passing urine -signs of decreased platelets or bleeding - bruising, pinpoint red spots on the skin, black, tarry stools, blood in the urine -signs of decreased red blood cells - unusually weak or tired, fainting spells, lightheadedness -breathing problems -chest pain -mouth sores -nausea and vomiting -pain, swelling, redness at site where injected -pain, tingling, numbness in the hands or feet -stomach pain -swelling of ankles, feet, hands -unusual bleeding Side effects that usually do not require medical attention (report to your doctor or health care professional if they continue or are bothersome): -constipation -diarrhea -hair loss -loss of appetite -stomach upset This list may not describe all possible side effects. Call your doctor for medical advice about side effects. You may report side effects to FDA at 1-800-FDA-1088. Where should I keep my medicine? This drug is given in a hospital or clinic and will not be stored at home. NOTE: This sheet is a summary. It may not cover all possible information. If you have questions about this medicine, talk to your doctor, pharmacist, or health care provider.  2013, [employer]. (11/30/2007 6:45:54 PM) Carboplatin injection What is this medicine? CARBOPLATIN (KAR boe pla tin) is a chemotherapy drug. It targets fast dividing cells, like cancer cells, and causes these cells to die. This medicine is used to treat ovarian cancer and many other cancers. This medicine may be used for other purposes; ask your health care provider or pharmacist if you have questions. What should I tell my health care provider before I take this medicine? They need to know if you have any of these conditions: -blood disorders -hearing problems -kidney disease -recent or ongoing radiation therapy -an  unusual or allergic reaction to carboplatin, cisplatin, other chemotherapy, other medicines, foods, dyes, or preservatives -pregnant or trying to get pregnant -breast-feeding How should I use this medicine? This drug is usually given as an infusion into a vein. It is administered in a hospital or clinic by a specially trained health care professional. Talk to your pediatrician regarding the use of this medicine in children. Special care may be needed. Overdosage: If you think you have taken too much of this medicine contact a poison control center or emergency room at once. NOTE: This medicine is only for you. Do not share this medicine with others. What if I miss a dose? It is important not to miss a dose. Call your doctor or health care professional if you are unable to keep an appointment. What may interact with this medicine? -medicines for seizures -medicines to increase blood counts like filgrastim, pegfilgrastim, sargramostim -some antibiotics like amikacin, gentamicin, neomycin, streptomycin, tobramycin -vaccines Talk to your doctor or health care professional before taking any of these medicines: -acetaminophen -aspirin -ibuprofen -ketoprofen -naproxen This list may not describe all possible interactions. Give your health care provider a list of all the medicines, herbs, non-prescription drugs, or dietary supplements you use. Also tell them if you smoke, drink alcohol, or use illegal drugs. Some items may interact with your medicine. What should I watch for while using this medicine? Your condition will be monitored carefully while you are receiving this medicine. You will need important blood work done while you are  taking this medicine. This drug may make you feel generally unwell. This is not uncommon, as chemotherapy can affect healthy cells as well as cancer cells. Report any side effects. Continue your course of treatment even though you feel ill unless your doctor tells you to  stop. In some cases, you may be given additional medicines to help with side effects. Follow all directions for their use. Call your doctor or health care professional for advice if you get a fever, chills or sore throat, or other symptoms of a cold or flu. Do not treat yourself. This drug decreases your body's ability to fight infections. Try to avoid being around people who are sick. This medicine may increase your risk to bruise or bleed. Call your doctor or health care professional if you notice any unusual bleeding. Be careful brushing and flossing your teeth or using a toothpick because you may get an infection or bleed more easily. If you have any dental work done, tell your dentist you are receiving this medicine. Avoid taking products that contain aspirin, acetaminophen, ibuprofen, naproxen, or ketoprofen unless instructed by your doctor. These medicines may hide a fever. Do not become pregnant while taking this medicine. Women should inform their doctor if they wish to become pregnant or think they might be pregnant. There is a potential for serious side effects to an unborn child. Talk to your health care professional or pharmacist for more information. Do not breast-feed an infant while taking this medicine. What side effects may I notice from receiving this medicine? Side effects that you should report to your doctor or health care professional as soon as possible: -allergic reactions like skin rash, itching or hives, swelling of the face, lips, or tongue -signs of infection - fever or chills, cough, sore throat, pain or difficulty passing urine -signs of decreased platelets or bleeding - bruising, pinpoint red spots on the skin, black, tarry stools, nosebleeds -signs of decreased red blood cells - unusually weak or tired, fainting spells, lightheadedness -breathing problems -changes in hearing -changes in vision -chest pain -high blood pressure -low blood counts - This drug may  decrease the number of white blood cells, red blood cells and platelets. You may be at increased risk for infections and bleeding. -nausea and vomiting -pain, swelling, redness or irritation at the injection site -pain, tingling, numbness in the hands or feet -problems with balance, talking, walking -trouble passing urine or change in the amount of urine Side effects that usually do not require medical attention (report to your doctor or health care professional if they continue or are bothersome): -hair loss -loss of appetite -metallic taste in the mouth or changes in taste This list may not describe all possible side effects. Call your doctor for medical advice about side effects. You may report side effects to FDA at 1-800-FDA-1088. Where should I keep my medicine? This drug is given in a hospital or clinic and will not be stored at home. NOTE: This sheet is a summary. It may not cover all possible information. If you have questions about this medicine, talk to your doctor, pharmacist, or health care provider.  2012, [employer]. (10/26/2007 2:38:05 PM)

## 2013-04-26 NOTE — Telephone Encounter (Signed)
Per staff message and POF I have scheduled appts.  JMW  

## 2013-04-26 NOTE — Progress Notes (Addendum)
OFFICE PROGRESS NOTE  CC**  [name], MD [address] 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   PRIOR THERAPY:  1. Patient has history of right breast cancer patient underwent a mastectomy when she was 57 years old. This occurred during her pregnancy. After that patient did not take any kind of further treatments. She continued to do well.   2.Recently she noted changes in her nipple had become itchy and retracted. Because of this she underwent mammogram with ultrasound that showed 2 areas in the left breast one at 12:00 position and a second at 3:00 position. She also had an ultrasound performed that showed the diameter to be at least 6 cm and multiple left axillary lymph nodes that were abnormal. She then went on to have a biopsy performed on 09/30/2012. 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 Patient went on to have MRIs of the breasts performed and was noted to be marked diffuse enhancement involving majority of the left breast measuring 13.0 x 12.5 x 9.5 cm. There was diffuse skin thickening and dermal enhancement. Multiple enlarged level I left axillary lymph nodes. The largest lymph node measured 2.4 cm in size. The large area of enhancement within the left breast extends posteriorly to abut the anterior portion of the pectoralis muscle. There was no evidence of internal mammary adenopathy or right axillary adenopathy.  PET/CT did show the left breast mass, and left axillary and subpectoralis nodal metastases, but no further areas throughout the chest/abd/pelvis of metastases.   3.  Patient currently undergoing  neoadjuvant chemotherapy with FEC x 6 cycles starting on 11/02/12, which will be followed by 12 cycles of weekly Taxol/Carbo that started on 02/08/13.  CURRENT THERAPY: Taxol Carbo week 9  INTERVAL HISTORY: Christine [name] 57 y.o. female returns for follow up today prior to chemotherapy.  She is doing moderately well today.  She is slightly dizzy and fatigued.  This has been going on since Friday, she denies loss of consciousness, and is eating and drinking well without difficulty.  Her numbness is stable and she is taking Gabapentin 300mg  TID.  She denies fevers, chills, nausea, vomiting, constipation, diarrhea, or further concerns.   MEDICAL HISTORY: Past Medical History  Diagnosis Date  . ALLERGIC RHINITIS   . GERD (gastroesophageal reflux disease)   . Hypertension   . Hypothyroidism   . Hx of colonic polyps   . Wears glasses   . Breast cancer 63, age 33    right  . Breast cancer 09/30/12    left, ER/PR -, Her 2 -    ALLERGIES:  is allergic to sulfa antibiotics and sulfonamide derivatives.  MEDICATIONS:  Current Outpatient Prescriptions  Medication Sig Dispense Refill  . dexamethasone (DECADRON) 4 MG tablet TAKE 2 TABS PO QD ON DAY AFTER CHEMO THEN 2 TABS BID X 2 DAYS  30 tablet  1  . diltiazem (CARDIZEM CD) 240 MG 24 hr capsule TAKE ONE CAPSULE BY MOUTH EVERY DAY  90 capsule  3  . fluconazole (DIFLUCAN) 200 MG tablet Take 1 tablet (200 mg total) by mouth every other day.  10 tablet  2  . fluticasone (FLONASE) 50 MCG/ACT  nasal spray Place 2 sprays into the nose daily.  16 g  2  . gabapentin (NEURONTIN) 300 MG capsule Take 1 capsule (300 mg total) by mouth 3 (three) times daily. 3 month supply  270 capsule  0  . latanoprost (XALATAN) 0.005 % ophthalmic solution Place 1 drop into both eyes at bedtime.       Marland Kitchen levothyroxine (SYNTHROID, LEVOTHROID) 50 MCG tablet TAKE 1 TABLET BY MOUTH EVERY DAY  90 tablet  2  . lidocaine-prilocaine (EMLA) cream Apply topically as needed.  30 g  6  .  LORazepam (ATIVAN) 0.5 MG tablet Take 1 tablet (0.5 mg total) by mouth every 6 (six) hours as needed (Nausea or vomiting).  30 tablet  0  . losartan-hydrochlorothiazide (HYZAAR) 50-12.5 MG per tablet Take 1 tablet by mouth daily.  30 tablet  3  . NEXIUM 40 MG capsule TAKE 1 CAPSULE (40 MG TOTAL) BY MOUTH DAILY BEFORE BREAKFAST.  90 capsule  1  . nystatin (MYCOSTATIN) 100000 UNIT/ML suspension Take 5 mLs (500,000 Units total) by mouth 4 (four) times daily.  240 mL  1  . potassium chloride SA (K-DUR,KLOR-CON) 20 MEQ tablet Take 2 tablets (40 mEq total) by mouth 2 (two) times daily.  120 tablet  0   No current facility-administered medications for this visit.   Facility-Administered Medications Ordered in Other Visits  Medication Dose Route Frequency Provider Last Rate Last Dose  . sodium chloride 0.9 % injection 10 mL  10 mL Intravenous PRN Victorino December, MD   10 mL at 04/27/13 1504    SURGICAL HISTORY:  Past Surgical History  Procedure Laterality Date  . Tonsillectomy    . Caesarean section      x 1  . Mastectomy  04/1987    right side  . Abdominal hysterectomy  06/1988    fibroids  . Portacath placement Right 10/28/2012    Procedure: PORT PLACEMENT;  Surgeon: Clovis Pu. Cornett, MD;  Location: [facility];  Service: General;  Laterality: Right;  Right Subclavian Vein    REVIEW OF SYSTEMS:  A 10 point review of systems was conducted and is otherwise negative except for what is noted above.     PHYSICAL EXAMINATION: Blood pressure 128/83, pulse 114, temperature 98.4 F (36.9 C), temperature source Oral, resp. rate 20, height 5\' 4"  (1.626 m), weight 197 lb 12.8 oz (89.721 kg). Body mass index is 33.94 kg/(m^2). General: Patient is a pale appearing female in no acute distress HEENT: PERRLA, sclerae anicteric no conjunctival pallor, MMM. Neck: supple, no palpable adenopathy Lungs: clear to auscultation bilaterally, no wheezes, rhonchi, or rales Cardiovascular: regular  rate rhythm, S1, S2, no murmurs, rubs or gallops Abdomen: Soft, non-tender, non-distended, normoactive bowel sounds, no HSM Extremities: warm and well perfused, no clubbing, cyanosis, or edema Skin: No rashes or lesions Neuro: Non-focal Breasts: Right mastectomy site without nodularity or sign of recurrence.  Left breast mass palpable along with dimpling of skin and inversion of the nipple--much softer and smaller.  ECOG PERFORMANCE STATUS: 1 - Symptomatic but completely ambulatory  LABORATORY DATA: Lab Results  Component Value Date   WBC 2.7* 04/26/2013   HGB 10.1* 04/26/2013   HCT 29.5* 04/26/2013   MCV 93.1 04/26/2013   PLT 82* 04/26/2013      Chemistry      Component Value Date/Time   NA 136 04/26/2013 1413   NA 144 10/28/2012 1248   K 3.6 04/26/2013 1413   K 3.9 10/28/2012 1248  CL 101 01/25/2013 0952   CL 108 10/28/2012 1248   CO2 25 04/26/2013 1413   CO2 29 09/13/2011 1910   BUN 25.5 04/26/2013 1413   BUN 10 10/28/2012 1248   CREATININE 1.0 04/26/2013 1413   CREATININE 0.80 10/28/2012 1248      Component Value Date/Time   CALCIUM 9.4 04/26/2013 1413   CALCIUM 9.4 09/13/2011 1910   ALKPHOS 87 04/26/2013 1413   ALKPHOS 78 09/13/2011 1910   AST 17 04/26/2013 1413   AST 18 09/13/2011 1910   ALT 20 04/26/2013 1413   ALT 12 09/13/2011 1910   BILITOT 0.47 04/26/2013 1413   BILITOT 0.3 09/13/2011 1910       RADIOGRAPHIC STUDIES:  Ct Chest W Contrast  10/27/2012  *RADIOLOGY REPORT*  Clinical Data:  High risk breast cancer staging.  Right breast cancer diagnosed 1988.  Left breast cancer diagnosed February 2014.  CT CHEST, ABDOMEN AND PELVIS WITH CONTRAST  Technique:  Multidetector CT imaging of the chest, abdomen and pelvis was performed following the standard protocol during bolus administration of intravenous contrast.  Contrast: OMNIPAQUE IOHEXOL 300 MG/ML  SOLN  Comparison:  PET CT scan 03/26 1014  CT CHEST  Findings:  Right breast mastectomy anatomy.  There is thickening of the skin of  the left breast to 11 mm.  There is a mass within the deep posterior left breast measuring 29 x 30 mm (image 6).  There are enlarged round abnormal lymph nodes in the left axilla measuring up to 20 mm short axis (image 22).  Lymph nodes extend beneath the sub pectoralis muscles on the left (image 12).  There is no evidence of internal mammary lymphadenopathy.  No infraclavicular adenopathy.  No mediastinal adenopathy.  No pericardial fluid.  No suspicious pulmonary nodules.  IMPRESSION:  1.  Left breast mass consistent with breast carcinoma. 2.  Skin thickening over the left breast is concerning for inflammatory breast cancer. 3.  Nodal metastasis in the left axilla and sub pectoralis nodal stations. 4.  No evidence of central nodal metastasis or mediastinal metastasis  CT ABDOMEN AND PELVIS  Findings:  No focal hepatic lesion.  The gallbladder, pancreas, spleen, adrenal gland is, and kidneys are normal.  The stomach, small bowel, appendix, cecum are normal.  The colon and rectosigmoid colon are normal.  Abdominal aorta is normal caliber.  No retroperitoneal or periportal lymphadenopathy.  No mesenteric or peritoneal disease.  No free fluid in  the pelvis.  Post hysterectomy anatomy.  No pelvic lymphadenopathy. Review of  bone windows demonstrates no aggressive osseous lesions.  IMPRESSION:  No evidence of metastasis in the abdomen or pelvis.   Original Report Authenticated By: Genevive Bi, M.D.    Ct Abdomen Pelvis W Contrast  10/27/2012  *RADIOLOGY REPORT*  Clinical Data:  High risk breast cancer staging.  Right breast cancer diagnosed 1988.  Left breast cancer diagnosed February 2014.  CT CHEST, ABDOMEN AND PELVIS WITH CONTRAST  Technique:  Multidetector CT imaging of the chest, abdomen and pelvis was performed following the standard protocol during bolus administration of intravenous contrast.  Contrast: OMNIPAQUE IOHEXOL 300 MG/ML  SOLN  Comparison:  PET CT scan 03/26 1014  CT CHEST  Findings:   Right breast mastectomy anatomy.  There is thickening of the skin of the left breast to 11 mm.  There is a mass within the deep posterior left breast measuring 29 x 30 mm (image 6).  There are enlarged round abnormal lymph nodes in the  left axilla measuring up to 20 mm short axis (image 22).  Lymph nodes extend beneath the sub pectoralis muscles on the left (image 12).  There is no evidence of internal mammary lymphadenopathy.  No infraclavicular adenopathy.  No mediastinal adenopathy.  No pericardial fluid.  No suspicious pulmonary nodules.  IMPRESSION:  1.  Left breast mass consistent with breast carcinoma. 2.  Skin thickening over the left breast is concerning for inflammatory breast cancer. 3.  Nodal metastasis in the left axilla and sub pectoralis nodal stations. 4.  No evidence of central nodal metastasis or mediastinal metastasis  CT ABDOMEN AND PELVIS  Findings:  No focal hepatic lesion.  The gallbladder, pancreas, spleen, adrenal gland is, and kidneys are normal.  The stomach, small bowel, appendix, cecum are normal.  The colon and rectosigmoid colon are normal.  Abdominal aorta is normal caliber.  No retroperitoneal or periportal lymphadenopathy.  No mesenteric or peritoneal disease.  No free fluid in  the pelvis.  Post hysterectomy anatomy.  No pelvic lymphadenopathy. Review of  bone windows demonstrates no aggressive osseous lesions.  IMPRESSION:  No evidence of metastasis in the abdomen or pelvis.   Original Report Authenticated By: Genevive Bi, M.D.    Mr Breast Bilateral W Wo Contrast  10/12/2012  *RADIOLOGY REPORT*  Clinical Data: History of previous malignant mastectomy of the right breast 25 years ago.  The patient developed diffuse skin thickening and firmness within the left breast.  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  BUN and creatinine were obtained on site  at [facility] at [address] Results:  BUN 7 mg/dL,  Creatinine 1.0 mg/dL.  BILATERAL BREAST MRI WITH AND WITHOUT CONTRAST  Technique: Multiplanar, multisequence MR images of both breasts were obtained prior to and following the intravenous administration of 20ml of Multihance.  Three dimensional images were evaluated at the independent DynaCad workstation.  Comparison:  Mammograms dated 09/30/2012 and 09/28/2012.  Findings: There has been a previous right mastectomy.  There is marked diffuse enhancement involving the majority of the left breast.  This measures 13.0 x 12.5 x 9.5 cm in size.  This is associated with a mixture of plateau and washout enhancement kinetics.  In addition,  there is diffuse skin thickening and dermal enhancement.  There are multiple enlarged level I left axillary lymph nodes.  The largest lymph node measures 2.4 cm in size.  The large area of enhancement within the left breast extends posteriorly to abut the anterior portion of the pectoral muscle. There is no evidence for internal mammary adenopathy or right axillary adenopathy.  There are no additional findings.  IMPRESSION:  1.  Large ( 13 cm) area of enhancement involving the majority of the left breast consistent with the patient's known invasive ductal carcinoma and DCIS. This extends posteriorly to abut the pectoralis muscle. 2.  Multiple enlarged level I left axillary lymph nodes. 3.  Previous right mastectomy.  RECOMMENDATION: Treatment plan  THREE-DIMENSIONAL MR IMAGE RENDERING ON INDEPENDENT WORKSTATION:  Three-dimensional MR images were rendered by post-processing of the original MR data on an independent workstation.  The three- dimensional MR images were interpreted, and findings were reported in the accompanying complete MRI report for this study.  BI-RADS CATEGORY 6:  Known biopsy-proven malignancy - appropriate action should be taken.   Original Report Authenticated By: Rolla Plate, M.D.    Nm Pet  Image Initial (  pi) Skull Base To Thigh  10/27/2012  *RADIOLOGY REPORT*  Clinical Data: Initial treatment strategy for high risk of breast cancer. Triple negative  NUCLEAR MEDICINE PET SKULL BASE TO THIGH  Fasting Blood Glucose:  97  Technique:  17.9 mCi F-18 FDG was injected intravenously. CT data was obtained and used for attenuation correction and anatomic localization only.  (This was not acquired as a diagnostic CT examination.) Additional exam technical data entered on technologist worksheet.  Comparison:  Findings:  Neck: No hypermetabolic lymph nodes in the neck.  Chest:  There is intense metabolic activity ( SUV max = 16.1) associated with a large portion of the glandular tissue of the left breast consistent with carcinoma.  There are intensely hypermetabolic left axillary lymph nodes with SUV max = 15.1.  These nodes are rounded and measure up to 2 cm. Hypermetabolic nodes extend to the sub pectoralis nodal station (image 71).  There is a single hypermetabolic internal mammary lymph node measuring 5 mm just left of the sternum (image 87).  This has intense metabolic activity for size with SUV max = 7.1.  No hypermetabolic mediastinal lymph nodes.  No hypermetabolic pulmonary nodules.  Abdomen/Pelvis:  No abnormal hypermetabolic activity within the liver, pancreas, adrenal glands, or spleen.  No hypermetabolic lymph nodes in the abdomen or pelvis.  Skeleton:  No focal hypermetabolic activity to suggest skeletal metastasis.  IMPRESSION:  1.  Hypermetabolic breast tissue on the left consistent with primary carcinoma. 2.  Hypermetabolic nodal metastasis in the left axilla extending to the sub pectoralis nodes. 3.  Single hypermetabolic left internal mammary lymph node.  4.  No evidence of pulmonary metastasis.  5.  No evidence of metastasis beneath hemidiaphragms or within the skeleton.   Original Report Authenticated By: Genevive Bi, M.D.    Dg Chest Port 1 View  10/28/2012  *RADIOLOGY REPORT*   Clinical Data: Port-A-Cath placement.  PORTABLE CHEST - 1 VIEW  Comparison: None.  Findings: The patient is rotated on the study.  Right side Port-A- Cath is in place.  Tubing appears intact.  Tip of the catheter projects in the mid to lower superior vena cava.  No pneumothorax identified.  Heart size is normal.  Lungs are clear.  The patient is status post right mastectomy and axillary dissection.  IMPRESSION: Tip of Port-A-Cath projects over the mid to lower superior vena cava.  The patient is rotated on the study.  Exact position of the tip of the catheter could be determined with the lateral film. No pneumothorax.   Original Report Authenticated By: Holley Dexter, M.D.    Dg Fluoro Guide Cv Line-no Report  10/28/2012  CLINICAL DATA: [facility]   FLOURO GUIDE CV LINE  Fluoroscopy was utilized by the requesting physician.  No radiographic  interpretation.      ASSESSMENT:   57 year old female with  #1 history of breast cancer of the right breast status post mastectomy when she was 57 years old. Due to this she was referred for genetic testing.   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 Patient's tumor is ER negative PR negative HER-2/neu negative with a Ki-67 of 21%. Patient is being seen in medical oncology for neoadjuvant chemotherapy. Patient's pathology was discussed with her in detail, as well as treatment for breast cancer including surgical and with chemotherapy. Patient understands that she may still need a mastectomy however by giving her  neoadjuvant chemotherapy her surgery may be made a little bit easier hopefully with giving her negative margins. Patient started dense FEC x6 cycles followed by weekly Taxol carboplatinum for 12 weeks.  Currently due to receive Taxol carbo week 9.    #3 Patient underwent staging studies with PET/CT, and received echo, port placement and chemotherapy  class.    #4 Hypokalemia  PLAN:   1.Doing well.  Patient will not receive chemotherapy today.  Instead she will receive IV fluids.      2.  I reviewed her labs with her in detail.    3. She will return next week for Gemzar Carbo.  I reviewed the chemo with her and gave her written details in her AVS.  She has MRI breasts scheduled on 04/27/13.    4.  Will f/u on her pending potassium level and call if she needs to change her potassium supplementation.  All questions were answered. The patient knows to call the clinic with any problems, questions or concerns. We can certainly see the patient much sooner if necessary.  I spent 25 minutes counseling the patient face to face. The total time spent in the appointment was 30 minutes.  Cherie Ouch Lyn Hollingshead, NP Medical Oncology [facility] Phone: [phone] 04/27/2013, 3:21 PM  ATTENDING'S ATTESTATION:  I personally reviewed patient's chart, examined patient myself, formulated the treatment plan as followed.    Patient's counts are low today and she will not receive chemotherapy today. She does look a little dehydrated we will go ahead and give her some IV fluids. She will return in one week's time for Gemzar/carboplatinum treatment. She has details of the side effects of this chemotherapy. Patient is also scheduled to have MRI of the breasts in late September. She will also be seen by surgeon in the next few weeks.  Drue Second, MD Medical/Oncology [facility] [phone] (beeper) [phone] (Office)  05/04/2013, 1:13 PM

## 2013-04-26 NOTE — Telephone Encounter (Signed)
appts made and printed. Pt is aware that tx will be added. i emailed MW to add the tx's...td 

## 2013-04-26 NOTE — Patient Instructions (Addendum)
Dehydration, Adult Dehydration is when you lose more fluids from the body than you take in. Vital organs like the kidneys, brain, and heart cannot function without a proper amount of fluids and salt. Any loss of fluids from the body can cause dehydration.  CAUSES   Vomiting.  Diarrhea.  Excessive sweating.  Excessive urine output.  Fever. SYMPTOMS  Mild dehydration  Thirst.  Dry lips.  Slightly dry mouth. Moderate dehydration  Very dry mouth.  Sunken eyes.  Skin does not bounce back quickly when lightly pinched and released.  Dark urine and decreased urine production.  Decreased tear production.  Headache. Severe dehydration  Very dry mouth.  Extreme thirst.  Rapid, weak pulse (more than 100 beats per minute at rest).  Cold hands and feet.  Not able to sweat in spite of heat and temperature.  Rapid breathing.  Blue lips.  Confusion and lethargy.  Difficulty being awakened.  Minimal urine production.  No tears. DIAGNOSIS  Your caregiver will diagnose dehydration based on your symptoms and your exam. Blood and urine tests will help confirm the diagnosis. The diagnostic evaluation should also identify the cause of dehydration. TREATMENT  Treatment of mild or moderate dehydration can often be done at home by increasing the amount of fluids that you drink. It is best to drink small amounts of fluid more often. Drinking too much at one time can make vomiting worse. Refer to the home care instructions below. Severe dehydration needs to be treated at the hospital where you will probably be given intravenous (IV) fluids that contain water and electrolytes. HOME CARE INSTRUCTIONS   Ask your caregiver about specific rehydration instructions.  Drink enough fluids to keep your urine clear or pale yellow.  Drink small amounts frequently if you have nausea and vomiting.  Eat as you normally do.  Avoid:  Foods or drinks high in sugar.  Carbonated  drinks.  Juice.  Extremely hot or cold fluids.  Drinks with caffeine.  Fatty, greasy foods.  Alcohol.  Tobacco.  Overeating.  Gelatin desserts.  Wash your hands well to avoid spreading bacteria and viruses.  Only take over-the-counter or prescription medicines for pain, discomfort, or fever as directed by your caregiver.  Ask your caregiver if you should continue all prescribed and over-the-counter medicines.  Keep all follow-up appointments with your caregiver. SEEK MEDICAL CARE IF:  You have abdominal pain and it increases or stays in one area (localizes).  You have a rash, stiff neck, or severe headache.  You are irritable, sleepy, or difficult to awaken.  You are weak, dizzy, or extremely thirsty. SEEK IMMEDIATE MEDICAL CARE IF:   You are unable to keep fluids down or you get worse despite treatment.  You have frequent episodes of vomiting or diarrhea.  You have blood or green matter (bile) in your vomit.  You have blood in your stool or your stool looks black and tarry.  You have not urinated in 6 to 8 hours, or you have only urinated a small amount of very dark urine.  You have a fever.  You faint. MAKE SURE YOU:   Understand these instructions.  Will watch your condition.  Will get help right away if you are not doing well or get worse. Document Released: 07/21/2005 Document Revised: 10/13/2011 Document Reviewed: 03/10/2011 [employer] Patient Information 2014 [employer].  

## 2013-04-27 ENCOUNTER — Ambulatory Visit: Payer: BC Managed Care – PPO

## 2013-04-27 ENCOUNTER — Ambulatory Visit
Admission: RE | Admit: 2013-04-27 | Discharge: 2013-04-27 | Disposition: A | Discharge status: Home / Self Care | Payer: BC Managed Care – PPO | Source: Ambulatory Visit | Attending: Adult Health | Admitting: Adult Health

## 2013-04-27 ENCOUNTER — Ambulatory Visit (HOSPITAL_BASED_OUTPATIENT_CLINIC_OR_DEPARTMENT_OTHER): Payer: BC Managed Care – PPO

## 2013-04-27 DIAGNOSIS — Z452 Encounter for adjustment and management of vascular access device: Secondary | ICD-10-CM

## 2013-04-27 DIAGNOSIS — C50919 Malignant neoplasm of unspecified site of unspecified female breast: Secondary | ICD-10-CM

## 2013-04-27 DIAGNOSIS — C773 Secondary and unspecified malignant neoplasm of axilla and upper limb lymph nodes: Secondary | ICD-10-CM

## 2013-04-27 MED ORDER — HEPARIN SOD (PORK) LOCK FLUSH 100 UNIT/ML IV SOLN
500.0000 [IU] | Freq: Once | INTRAVENOUS | Status: DC
  Filled 2013-04-27: qty 5

## 2013-04-27 MED ORDER — GADOBENATE DIMEGLUMINE 529 MG/ML IV SOLN
18.0000 mL | Freq: Once | INTRAVENOUS | Status: AC | PRN
  Administered 2013-04-27: 18 mL via INTRAVENOUS

## 2013-04-27 MED ORDER — HEPARIN SOD (PORK) LOCK FLUSH 100 UNIT/ML IV SOLN
500.0000 [IU] | Freq: Once | INTRAVENOUS | Status: AC
  Administered 2013-04-27: 500 [IU] via INTRAVENOUS
  Filled 2013-04-27: qty 5

## 2013-04-27 MED ORDER — SODIUM CHLORIDE 0.9 % IJ SOLN
10.0000 mL | INTRAMUSCULAR | Status: DC | PRN
  Administered 2013-04-27: 10 mL via INTRAVENOUS
  Filled 2013-04-27: qty 10

## 2013-04-29 ENCOUNTER — Other Ambulatory Visit: Payer: Self-pay | Admitting: Internal Medicine

## 2013-04-29 ENCOUNTER — Other Ambulatory Visit: Payer: Self-pay | Admitting: *Deleted

## 2013-04-29 MED ORDER — LOSARTAN POTASSIUM-HCTZ 50-12.5 MG PO TABS: 1.0000 | ORAL_TABLET | Freq: Every day | ORAL | Status: DC

## 2013-05-03 ENCOUNTER — Ambulatory Visit: Payer: BC Managed Care – PPO | Admitting: Lab

## 2013-05-03 ENCOUNTER — Ambulatory Visit (HOSPITAL_COMMUNITY)
Admission: RE | Admit: 2013-05-03 | Discharge: 2013-05-03 | Disposition: A | Discharge status: Home / Self Care | Payer: BC Managed Care – PPO | Source: Ambulatory Visit | Attending: Oncology | Admitting: Oncology

## 2013-05-03 ENCOUNTER — Encounter: Payer: Self-pay | Admitting: Oncology

## 2013-05-03 ENCOUNTER — Ambulatory Visit: Payer: BC Managed Care – PPO

## 2013-05-03 ENCOUNTER — Telehealth: Payer: Self-pay | Admitting: *Deleted

## 2013-05-03 ENCOUNTER — Encounter: Payer: Self-pay | Admitting: Adult Health

## 2013-05-03 ENCOUNTER — Other Ambulatory Visit (HOSPITAL_BASED_OUTPATIENT_CLINIC_OR_DEPARTMENT_OTHER): Payer: BC Managed Care – PPO | Admitting: Lab

## 2013-05-03 ENCOUNTER — Ambulatory Visit (HOSPITAL_BASED_OUTPATIENT_CLINIC_OR_DEPARTMENT_OTHER): Payer: BC Managed Care – PPO | Admitting: Adult Health

## 2013-05-03 VITALS — BP 141/83 | HR 105 | Temp 98.6°F | Resp 18 | Ht 64.0 in

## 2013-05-03 DIAGNOSIS — C773 Secondary and unspecified malignant neoplasm of axilla and upper limb lymph nodes: Secondary | ICD-10-CM

## 2013-05-03 DIAGNOSIS — T451X5A Adverse effect of antineoplastic and immunosuppressive drugs, initial encounter: Secondary | ICD-10-CM | POA: Insufficient documentation

## 2013-05-03 DIAGNOSIS — C50919 Malignant neoplasm of unspecified site of unspecified female breast: Secondary | ICD-10-CM

## 2013-05-03 DIAGNOSIS — R0609 Other forms of dyspnea: Secondary | ICD-10-CM

## 2013-05-03 DIAGNOSIS — E876 Hypokalemia: Secondary | ICD-10-CM

## 2013-05-03 DIAGNOSIS — D649 Anemia, unspecified: Secondary | ICD-10-CM

## 2013-05-03 DIAGNOSIS — C50912 Malignant neoplasm of unspecified site of left female breast: Secondary | ICD-10-CM

## 2013-05-03 DIAGNOSIS — D709 Neutropenia, unspecified: Secondary | ICD-10-CM

## 2013-05-03 DIAGNOSIS — D6481 Anemia due to antineoplastic chemotherapy: Secondary | ICD-10-CM | POA: Insufficient documentation

## 2013-05-03 DIAGNOSIS — Z171 Estrogen receptor negative status [ER-]: Secondary | ICD-10-CM

## 2013-05-03 LAB — CBC WITH DIFFERENTIAL/PLATELET
BASO%: 0.4 % (ref 0.0–2.0)
EOS%: 0 % (ref 0.0–7.0)
LYMPH%: 49.4 % (ref 14.0–49.7)
MCH: 31.9 pg (ref 25.1–34.0)
MCHC: 32.1 g/dL (ref 31.5–36.0)
MONO#: 0.3 10*3/uL (ref 0.1–0.9)
MONO%: 12.4 % (ref 0.0–14.0)
NEUT#: 0.9 10*3/uL — ABNORMAL LOW (ref 1.5–6.5)
NEUT%: 37.8 % — ABNORMAL LOW (ref 38.4–76.8)
Platelets: 111 10*3/uL — ABNORMAL LOW (ref 145–400)
RBC: 2.48 10*6/uL — ABNORMAL LOW (ref 3.70–5.45)
WBC: 2.4 10*3/uL — ABNORMAL LOW (ref 3.9–10.3)
nRBC: 5 % — ABNORMAL HIGH (ref 0–0)

## 2013-05-03 LAB — COMPREHENSIVE METABOLIC PANEL (CC13)
Albumin: 3.2 g/dL — ABNORMAL LOW (ref 3.5–5.0)
Alkaline Phosphatase: 80 U/L (ref 40–150)
BUN: 8.1 mg/dL (ref 7.0–26.0)
Calcium: 8.4 mg/dL (ref 8.4–10.4)
Chloride: 103 mEq/L (ref 98–109)
Creatinine: 0.7 mg/dL (ref 0.6–1.1)
Glucose: 196 mg/dl — ABNORMAL HIGH (ref 70–140)
Potassium: 3 mEq/L — CL (ref 3.5–5.1)
Total Bilirubin: 0.39 mg/dL (ref 0.20–1.20)

## 2013-05-03 MED ORDER — FILGRASTIM 480 MCG/0.8ML IJ SOLN
480.0000 ug | Freq: Once | INTRAMUSCULAR | Status: AC
  Administered 2013-05-03: 480 ug via SUBCUTANEOUS
  Filled 2013-05-03: qty 0.8

## 2013-05-03 MED ORDER — SODIUM CHLORIDE 0.9 % IJ SOLN
10.0000 mL | INTRAMUSCULAR | Status: DC | PRN
  Administered 2013-05-03: 10 mL via INTRAVENOUS
  Filled 2013-05-03: qty 10

## 2013-05-03 MED ORDER — HEPARIN SOD (PORK) LOCK FLUSH 100 UNIT/ML IV SOLN
500.0000 [IU] | Freq: Once | INTRAVENOUS | Status: AC
  Administered 2013-05-03: 500 [IU] via INTRAVENOUS
  Filled 2013-05-03: qty 5

## 2013-05-03 NOTE — Telephone Encounter (Signed)
appts made and printed. Pt is aware that her gemzar will be added. i emailed MW to add the tx...td

## 2013-05-03 NOTE — Patient Instructions (Addendum)

## 2013-05-03 NOTE — Progress Notes (Signed)
OFFICE PROGRESS NOTE  CC**  [name], MD [address] 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   PRIOR THERAPY:  1. Patient has history of right breast cancer patient underwent a mastectomy when she was 57 years old. This occurred during her pregnancy. After that patient did not take any kind of further treatments. She continued to do well.   2.Recently she noted changes in her nipple had become itchy and retracted. Because of this she underwent mammogram with ultrasound that showed 2 areas in the left breast one at 12:00 position and a second at 3:00 position. She also had an ultrasound performed that showed the diameter to be at least 6 cm and multiple left axillary lymph nodes that were abnormal. She then went on to have a biopsy performed on 09/30/2012. 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 Patient went on to have MRIs of the breasts performed and was noted to be marked diffuse enhancement involving majority of the left breast measuring 13.0 x 12.5 x 9.5 cm. There was diffuse skin thickening and dermal enhancement. Multiple enlarged level I left axillary lymph nodes. The largest lymph node measured 2.4 cm in size. The large area of enhancement within the left breast extends posteriorly to abut the anterior portion of the pectoralis muscle. There was no evidence of internal mammary adenopathy or right axillary adenopathy.  PET/CT did show the left breast mass, and left axillary and subpectoralis nodal metastases, but no further areas throughout the chest/abd/pelvis of metastases.   3.  Patient currently undergoing  neoadjuvant chemotherapy with FEC x 6 cycles starting on 11/02/12, which will be followed by 12 cycles of weekly Taxol/Carbo that started on 02/08/13.  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  She will begin Gemzar/Carbo on 05/10/13.  She will receive 2 cycles.    CURRENT THERAPY: Gemzar carbo due to start 05/10/13  INTERVAL HISTORY: Christine [name] 58 y.o. female returns for follow up today prior to chemotherapy. She continues to feel fatigued.  She's also had increased dyspnea on exertion.  She denies fevers, chills, nausea, vomiting, constipation, diarrhea.  Her numbness is stable and she continues to take neurontin TID.  Otherwise, she is well and without concerns.    MEDICAL HISTORY: Past Medical History  Diagnosis Date  . ALLERGIC RHINITIS   . GERD (gastroesophageal reflux disease)   . Hypertension   . Hypothyroidism   . Hx of colonic polyps   . Wears glasses   . Breast cancer 58, age 56    right  . Breast cancer 09/30/12    left, ER/PR -, Her 2 -    ALLERGIES:  is allergic to sulfa antibiotics and sulfonamide derivatives.  MEDICATIONS:  Current Outpatient Prescriptions  Medication Sig Dispense Refill  . dexamethasone (DECADRON) 4 MG tablet TAKE 2 TABS PO QD ON DAY AFTER CHEMO THEN 2 TABS BID X 2 DAYS  30 tablet  1  . diltiazem (CARDIZEM CD) 240 MG 24 hr capsule TAKE ONE CAPSULE BY MOUTH EVERY DAY  90 capsule  3  . fluconazole (DIFLUCAN) 200 MG tablet Take 1 tablet (200 mg total)  by mouth every other day.  10 tablet  2  . fluticasone (FLONASE) 50 MCG/ACT nasal spray Place 2 sprays into the nose daily.  16 g  2  . gabapentin (NEURONTIN) 300 MG capsule Take 1 capsule (300 mg total) by mouth 3 (three) times daily. 3 month supply  270 capsule  0  . latanoprost (XALATAN) 0.005 % ophthalmic solution Place 1 drop into both eyes at bedtime.       Marland Kitchen levothyroxine (SYNTHROID, LEVOTHROID) 50 MCG tablet TAKE 1 TABLET BY MOUTH EVERY DAY  90  tablet  2  . lidocaine-prilocaine (EMLA) cream Apply topically as needed.  30 g  6  . LORazepam (ATIVAN) 0.5 MG tablet Take 1 tablet (0.5 mg total) by mouth every 6 (six) hours as needed (Nausea or vomiting).  30 tablet  0  . losartan-hydrochlorothiazide (HYZAAR) 50-12.5 MG per tablet Take 1 tablet by mouth daily.  90 tablet  0  . NEXIUM 40 MG capsule TAKE 1 CAPSULE (40 MG TOTAL) BY MOUTH DAILY BEFORE BREAKFAST.  90 capsule  1  . nystatin (MYCOSTATIN) 100000 UNIT/ML suspension Take 5 mLs (500,000 Units total) by mouth 4 (four) times daily.  240 mL  1  . potassium chloride SA (K-DUR,KLOR-CON) 20 MEQ tablet Take 2 tablets (40 mEq total) by mouth 2 (two) times daily.  120 tablet  0   No current facility-administered medications for this visit.    SURGICAL HISTORY:  Past Surgical History  Procedure Laterality Date  . Tonsillectomy    . Caesarean section      x 1  . Mastectomy  04/1987    right side  . Abdominal hysterectomy  06/1988    fibroids  . Portacath placement Right 10/28/2012    Procedure: PORT PLACEMENT;  Surgeon: Clovis Pu. Cornett, MD;  Location: [facility];  Service: General;  Laterality: Right;  Right Subclavian Vein    REVIEW OF SYSTEMS:  A 10 point review of systems was conducted and is otherwise negative except for what is noted above.     PHYSICAL EXAMINATION: Blood pressure 141/83, pulse 105, temperature 98.6 F (37 C), temperature source Oral, resp. rate 18, height 5\' 4"  (1.626 m). There is no weight on file to calculate BMI. General: Patient is a pale appearing female in no acute distress HEENT: PERRLA, sclerae anicteric no conjunctival pallor, MMM. Neck: supple, no palpable adenopathy Lungs: clear to auscultation bilaterally, no wheezes, rhonchi, or rales Cardiovascular: regular rate rhythm, S1, S2, no murmurs, rubs or gallops Abdomen: Soft, non-tender, non-distended, normoactive bowel sounds, no HSM Extremities: warm and well perfused, no clubbing,  cyanosis, or edema Skin: No rashes or lesions Neuro: Non-focal Breasts: Right mastectomy site without nodularity or sign of recurrence.  Left breast mass palpable along with dimpling of skin and inversion of the nipple--much softer and smaller.  ECOG PERFORMANCE STATUS: 1 - Symptomatic but completely ambulatory  LABORATORY DATA: Lab Results  Component Value Date   WBC 2.4* 05/03/2013   HGB 7.9* 05/03/2013   HCT 24.6* 05/03/2013   MCV 99.2 05/03/2013   PLT 111* 05/03/2013      Chemistry      Component Value Date/Time   NA 145 05/03/2013 1304   NA 144 10/28/2012 1248   K 3.0* 05/03/2013 1304   K 3.9 10/28/2012 1248   CL 101 01/25/2013 0952   CL 108 10/28/2012 1248   CO2 30* 05/03/2013 1304   CO2 29 09/13/2011 1910   BUN 8.1 05/03/2013 1304  BUN 10 10/28/2012 1248   CREATININE 0.7 05/03/2013 1304   CREATININE 0.80 10/28/2012 1248      Component Value Date/Time   CALCIUM 8.4 05/03/2013 1304   CALCIUM 9.4 09/13/2011 1910   ALKPHOS 80 05/03/2013 1304   ALKPHOS 78 09/13/2011 1910   AST 22 05/03/2013 1304   AST 18 09/13/2011 1910   ALT 17 05/03/2013 1304   ALT 12 09/13/2011 1910   BILITOT 0.39 05/03/2013 1304   BILITOT 0.3 09/13/2011 1910       RADIOGRAPHIC STUDIES:  Ct Chest W Contrast  10/27/2012  *RADIOLOGY REPORT*  Clinical Data:  High risk breast cancer staging.  Right breast cancer diagnosed 1988.  Left breast cancer diagnosed February 2014.  CT CHEST, ABDOMEN AND PELVIS WITH CONTRAST  Technique:  Multidetector CT imaging of the chest, abdomen and pelvis was performed following the standard protocol during bolus administration of intravenous contrast.  Contrast: OMNIPAQUE IOHEXOL 300 MG/ML  SOLN  Comparison:  PET CT scan 03/26 1014  CT CHEST  Findings:  Right breast mastectomy anatomy.  There is thickening of the skin of the left breast to 11 mm.  There is a mass within the deep posterior left breast measuring 29 x 30 mm (image 6).  There are enlarged round abnormal lymph nodes in the left  axilla measuring up to 20 mm short axis (image 22).  Lymph nodes extend beneath the sub pectoralis muscles on the left (image 12).  There is no evidence of internal mammary lymphadenopathy.  No infraclavicular adenopathy.  No mediastinal adenopathy.  No pericardial fluid.  No suspicious pulmonary nodules.  IMPRESSION:  1.  Left breast mass consistent with breast carcinoma. 2.  Skin thickening over the left breast is concerning for inflammatory breast cancer. 3.  Nodal metastasis in the left axilla and sub pectoralis nodal stations. 4.  No evidence of central nodal metastasis or mediastinal metastasis  CT ABDOMEN AND PELVIS  Findings:  No focal hepatic lesion.  The gallbladder, pancreas, spleen, adrenal gland is, and kidneys are normal.  The stomach, small bowel, appendix, cecum are normal.  The colon and rectosigmoid colon are normal.  Abdominal aorta is normal caliber.  No retroperitoneal or periportal lymphadenopathy.  No mesenteric or peritoneal disease.  No free fluid in  the pelvis.  Post hysterectomy anatomy.  No pelvic lymphadenopathy. Review of  bone windows demonstrates no aggressive osseous lesions.  IMPRESSION:  No evidence of metastasis in the abdomen or pelvis.   Original Report Authenticated By: Genevive Bi, M.D.    Ct Abdomen Pelvis W Contrast  10/27/2012  *RADIOLOGY REPORT*  Clinical Data:  High risk breast cancer staging.  Right breast cancer diagnosed 1988.  Left breast cancer diagnosed February 2014.  CT CHEST, ABDOMEN AND PELVIS WITH CONTRAST  Technique:  Multidetector CT imaging of the chest, abdomen and pelvis was performed following the standard protocol during bolus administration of intravenous contrast.  Contrast: OMNIPAQUE IOHEXOL 300 MG/ML  SOLN  Comparison:  PET CT scan 03/26 1014  CT CHEST  Findings:  Right breast mastectomy anatomy.  There is thickening of the skin of the left breast to 11 mm.  There is a mass within the deep posterior left breast measuring 29 x 30 mm  (image 6).  There are enlarged round abnormal lymph nodes in the left axilla measuring up to 20 mm short axis (image 22).  Lymph nodes extend beneath the sub pectoralis muscles on the left (image 12).  There is no evidence  of internal mammary lymphadenopathy.  No infraclavicular adenopathy.  No mediastinal adenopathy.  No pericardial fluid.  No suspicious pulmonary nodules.  IMPRESSION:  1.  Left breast mass consistent with breast carcinoma. 2.  Skin thickening over the left breast is concerning for inflammatory breast cancer. 3.  Nodal metastasis in the left axilla and sub pectoralis nodal stations. 4.  No evidence of central nodal metastasis or mediastinal metastasis  CT ABDOMEN AND PELVIS  Findings:  No focal hepatic lesion.  The gallbladder, pancreas, spleen, adrenal gland is, and kidneys are normal.  The stomach, small bowel, appendix, cecum are normal.  The colon and rectosigmoid colon are normal.  Abdominal aorta is normal caliber.  No retroperitoneal or periportal lymphadenopathy.  No mesenteric or peritoneal disease.  No free fluid in  the pelvis.  Post hysterectomy anatomy.  No pelvic lymphadenopathy. Review of  bone windows demonstrates no aggressive osseous lesions.  IMPRESSION:  No evidence of metastasis in the abdomen or pelvis.   Original Report Authenticated By: Genevive Bi, M.D.    Mr Breast Bilateral W Wo Contrast  10/12/2012  *RADIOLOGY REPORT*  Clinical Data: History of previous malignant mastectomy of the right breast 25 years ago.  The patient developed diffuse skin thickening and firmness within the left breast.  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  BUN and creatinine were obtained on site at [facility] at [address] Results:  BUN 7 mg/dL,  Creatinine 1.0 mg/dL.  BILATERAL BREAST MRI WITH AND WITHOUT CONTRAST  Technique: Multiplanar,  multisequence MR images of both breasts were obtained prior to and following the intravenous administration of 20ml of Multihance.  Three dimensional images were evaluated at the independent DynaCad workstation.  Comparison:  Mammograms dated 09/30/2012 and 09/28/2012.  Findings: There has been a previous right mastectomy.  There is marked diffuse enhancement involving the majority of the left breast.  This measures 13.0 x 12.5 x 9.5 cm in size.  This is associated with a mixture of plateau and washout enhancement kinetics.  In addition,  there is diffuse skin thickening and dermal enhancement.  There are multiple enlarged level I left axillary lymph nodes.  The largest lymph node measures 2.4 cm in size.  The large area of enhancement within the left breast extends posteriorly to abut the anterior portion of the pectoral muscle. There is no evidence for internal mammary adenopathy or right axillary adenopathy.  There are no additional findings.  IMPRESSION:  1.  Large ( 13 cm) area of enhancement involving the majority of the left breast consistent with the patient's known invasive ductal carcinoma and DCIS. This extends posteriorly to abut the pectoralis muscle. 2.  Multiple enlarged level I left axillary lymph nodes. 3.  Previous right mastectomy.  RECOMMENDATION: Treatment plan  THREE-DIMENSIONAL MR IMAGE RENDERING ON INDEPENDENT WORKSTATION:  Three-dimensional MR images were rendered by post-processing of the original MR data on an independent workstation.  The three- dimensional MR images were interpreted, and findings were reported in the accompanying complete MRI report for this study.  BI-RADS CATEGORY 6:  Known biopsy-proven malignancy - appropriate action should be taken.   Original Report Authenticated By: Rolla Plate, M.D.    Nm Pet Image Initial (pi) Skull Base To Thigh  10/27/2012  *RADIOLOGY REPORT*  Clinical Data: Initial treatment strategy for high risk of breast cancer. Triple negative   NUCLEAR MEDICINE PET SKULL BASE  TO THIGH  Fasting Blood Glucose:  97  Technique:  17.9 mCi F-18 FDG was injected intravenously. CT data was obtained and used for attenuation correction and anatomic localization only.  (This was not acquired as a diagnostic CT examination.) Additional exam technical data entered on technologist worksheet.  Comparison:  Findings:  Neck: No hypermetabolic lymph nodes in the neck.  Chest:  There is intense metabolic activity ( SUV max = 45.4) associated with a large portion of the glandular tissue of the left breast consistent with carcinoma.  There are intensely hypermetabolic left axillary lymph nodes with SUV max = 15.1.  These nodes are rounded and measure up to 2 cm. Hypermetabolic nodes extend to the sub pectoralis nodal station (image 71).  There is a single hypermetabolic internal mammary lymph node measuring 5 mm just left of the sternum (image 87).  This has intense metabolic activity for size with SUV max = 7.1.  No hypermetabolic mediastinal lymph nodes.  No hypermetabolic pulmonary nodules.  Abdomen/Pelvis:  No abnormal hypermetabolic activity within the liver, pancreas, adrenal glands, or spleen.  No hypermetabolic lymph nodes in the abdomen or pelvis.  Skeleton:  No focal hypermetabolic activity to suggest skeletal metastasis.  IMPRESSION:  1.  Hypermetabolic breast tissue on the left consistent with primary carcinoma. 2.  Hypermetabolic nodal metastasis in the left axilla extending to the sub pectoralis nodes. 3.  Single hypermetabolic left internal mammary lymph node.  4.  No evidence of pulmonary metastasis.  5.  No evidence of metastasis beneath hemidiaphragms or within the skeleton.   Original Report Authenticated By: Genevive Bi, M.D.    Dg Chest Port 1 View  10/28/2012  *RADIOLOGY REPORT*  Clinical Data: Port-A-Cath placement.  PORTABLE CHEST - 1 VIEW  Comparison: None.  Findings: The patient is rotated on the study.  Right side Port-A- Cath is in place.   Tubing appears intact.  Tip of the catheter projects in the mid to lower superior vena cava.  No pneumothorax identified.  Heart size is normal.  Lungs are clear.  The patient is status post right mastectomy and axillary dissection.  IMPRESSION: Tip of Port-A-Cath projects over the mid to lower superior vena cava.  The patient is rotated on the study.  Exact position of the tip of the catheter could be determined with the lateral film. No pneumothorax.   Original Report Authenticated By: Holley Dexter, M.D.    Dg Fluoro Guide Cv Line-no Report  10/28/2012  CLINICAL DATA: [facility]   FLOURO GUIDE CV LINE  Fluoroscopy was utilized by the requesting physician.  No radiographic  interpretation.      ASSESSMENT:   57 year old female with  #1 history of breast cancer of the right breast status post mastectomy when she was 57 years old. Due to this she was referred for genetic testing.   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 Patient's tumor is ER negative PR negative HER-2/neu negative with a Ki-67 of 21%. Patient is being seen in medical oncology for neoadjuvant chemotherapy. Patient's pathology was discussed with her in detail, as well as treatment for breast cancer including surgical and with chemotherapy. Patient understands that she may still need a mastectomy however by giving her neoadjuvant chemotherapy her surgery may be made a little bit easier hopefully with giving her negative margins. Patient started dense FEC x6 cycles followed by weekly Taxol carboplatinum for 12  weeks.  We discontinued the Taxol carbo after 8 weeks due to progressive neuropathy.  She will start Gemzar carbo on 10/7.     #3 Patient underwent staging studies with PET/CT, and received echo, port placement and chemotherapy class.    #4 Hypokalemia  PLAN:   1.Doing well.  Patient will not receive chemotherapy today.   She has symptomatic anemia and needs a blood transfusion.  She is also neutropenic and we will give her Neupogen x 4 days.      2. She will return tomorrow for a blood transfusion and for her neupogen appointments.  We will see her next week for her labs and appt, and Gemzar Carbo.    3.  I reviewed her repeat MRI with her.  Her tumor has decreased to 2 spiculated masses in her left breast, one 1.2 cm the other 1.3 cm.  This is excellent compared to the 13cm tumor prior to starting chemotherapy.    All questions were answered. The patient knows to call the clinic with any problems, questions or concerns. We can certainly see the patient much sooner if necessary.  I spent 25 minutes counseling the patient face to face. The total time spent in the appointment was 30 minutes.  Cherie Ouch Lyn Hollingshead, NP Medical Oncology [facility] Phone: [phone] 05/03/2013, 10:40 PM

## 2013-05-03 NOTE — Patient Instructions (Signed)
Blood Transfusion Information WHAT IS A BLOOD TRANSFUSION? A transfusion is the replacement of blood or some of its parts. Blood is made up of multiple cells which provide different functions.  Red blood cells carry oxygen and are used for blood loss replacement.  White blood cells fight against infection.  Platelets control bleeding.  Plasma helps clot blood.  Other blood products are available for specialized needs, such as hemophilia or other clotting disorders. BEFORE THE TRANSFUSION  Who gives blood for transfusions?   You may be able to donate blood to be used at a later date on yourself (autologous donation).  Relatives can be asked to donate blood. This is generally not any safer than if you have received blood from a stranger. The same precautions are taken to ensure safety when a relative's blood is donated.  Healthy volunteers who are fully evaluated to make sure their blood is safe. This is blood bank blood. Transfusion therapy is the safest it has ever been in the practice of medicine. Before blood is taken from a donor, a complete history is taken to make sure that person has no history of diseases nor engages in risky social behavior (examples are intravenous drug use or sexual activity with multiple partners). The donor's travel history is screened to minimize risk of transmitting infections, such as malaria. The donated blood is tested for signs of infectious diseases, such as HIV and hepatitis. The blood is then tested to be sure it is compatible with you in order to minimize the chance of a transfusion reaction. If you or a relative donates blood, this is often done in anticipation of surgery and is not appropriate for emergency situations. It takes many days to process the donated blood. RISKS AND COMPLICATIONS Although transfusion therapy is very safe and saves many lives, the main dangers of transfusion include:   Getting an infectious disease.  Developing a  transfusion reaction. This is an allergic reaction to something in the blood you were given. Every precaution is taken to prevent this. The decision to have a blood transfusion has been considered carefully by your caregiver before blood is given. Blood is not given unless the benefits outweigh the risks. AFTER THE TRANSFUSION  Right after receiving a blood transfusion, you will usually feel much better and more energetic. This is especially true if your red blood cells have gotten low (anemic). The transfusion raises the level of the red blood cells which carry oxygen, and this usually causes an energy increase.  The nurse administering the transfusion will monitor you carefully for complications. HOME CARE INSTRUCTIONS  No special instructions are needed after a transfusion. You may find your energy is better. Speak with your caregiver about any limitations on activity for underlying diseases you may have. SEEK MEDICAL CARE IF:   Your condition is not improving after your transfusion.  You develop redness or irritation at the intravenous (IV) site. SEEK IMMEDIATE MEDICAL CARE IF:  Any of the following symptoms occur over the next 12 hours:  Shaking chills.  You have a temperature by mouth above 102 F (38.9 C), not controlled by medicine.  Chest, back, or muscle pain.  People around you feel you are not acting correctly or are confused.  Shortness of breath or difficulty breathing.  Dizziness and fainting.  You get a rash or develop hives.  You have a decrease in urine output.  Your urine turns a dark color or changes to pink, red, or brown. Any of the following   symptoms occur over the next 10 days:  You have a temperature by mouth above 102 F (38.9 C), not controlled by medicine.  Shortness of breath.  Weakness after normal activity.  The white part of the eye turns yellow (jaundice).  You have a decrease in the amount of urine or are urinating less often.  Your  urine turns a dark color or changes to pink, red, or brown. Document Released: 07/18/2000 Document Revised: 10/13/2011 Document Reviewed: 03/06/2008 [facility] Patient Information 2014 [place], [place]. Blood Products Information This is information about transfusions of blood products. All blood that is to be transfused is tested for blood type, compatibility with the recipient, and for infections. Except in emergencies, giving a transfusion requires a written consent. Blood transfusions are often given as packed red blood cells. This means the other parts of the blood have been taken out. Blood may be needed to treat severe anemia or bleeding. Other blood products include plasma, platelets, immune globulin, and cryoprecipitate. Blood for transfusion is mostly donated by volunteers. The blood donors are carefully screened for risk factors that could cause disease. Donors are all tested for infections that could be transmitted by blood. The blood product supply today is the safest it has ever been. Some risks do remain.  A minor reaction with fever, chills, or rash happens in about 1% of blood product transfusions.  Life-threatening reactions occur in less than 1 in a million transfusions.  Infection with germs (bacteria), viruses or parasites like malaria can still happen. The risk is very low.  Hepatitis B occurs in about 1 case in 150,000 transfusions.  Hepatitis C is seen once in 500,000.  HIV is transmitted less than once every million transfusions. When you receive a transfusion of packed red blood cells, your blood is tested for blood group and Rh type. Your blood is also screened for antibodies that could cause a serious reaction. A cross-match test is done to make sure the blood is safe to give.  Talk with your caregiver if you have any concerns about receiving a transfusion of blood products. Make sure your questions are answered. Transfusions are not given if your caregiver feels the  risk is greater than the need. Document Released: 07/21/2005 Document Revised: 10/13/2011 Document Reviewed: 01/08/2007 [facility] Patient Information 2014 [place], [place].

## 2013-05-04 ENCOUNTER — Other Ambulatory Visit: Payer: Self-pay | Admitting: *Deleted

## 2013-05-04 ENCOUNTER — Ambulatory Visit: Payer: BC Managed Care – PPO

## 2013-05-04 ENCOUNTER — Ambulatory Visit (HOSPITAL_COMMUNITY)
Admission: RE | Admit: 2013-05-04 | Discharge: 2013-05-04 | Disposition: A | Discharge status: Home / Self Care | Payer: BC Managed Care – PPO | Source: Ambulatory Visit | Attending: Oncology | Admitting: Oncology

## 2013-05-04 ENCOUNTER — Other Ambulatory Visit: Payer: Self-pay | Admitting: Emergency Medicine

## 2013-05-04 ENCOUNTER — Ambulatory Visit (HOSPITAL_BASED_OUTPATIENT_CLINIC_OR_DEPARTMENT_OTHER): Payer: BC Managed Care – PPO

## 2013-05-04 VITALS — BP 137/87 | HR 96 | Temp 98.3°F | Resp 18

## 2013-05-04 DIAGNOSIS — D709 Neutropenia, unspecified: Secondary | ICD-10-CM

## 2013-05-04 DIAGNOSIS — C50912 Malignant neoplasm of unspecified site of left female breast: Secondary | ICD-10-CM

## 2013-05-04 DIAGNOSIS — D6481 Anemia due to antineoplastic chemotherapy: Secondary | ICD-10-CM

## 2013-05-04 DIAGNOSIS — T451X5A Adverse effect of antineoplastic and immunosuppressive drugs, initial encounter: Secondary | ICD-10-CM

## 2013-05-04 DIAGNOSIS — D649 Anemia, unspecified: Secondary | ICD-10-CM | POA: Insufficient documentation

## 2013-05-04 DIAGNOSIS — E876 Hypokalemia: Secondary | ICD-10-CM

## 2013-05-04 DIAGNOSIS — C50419 Malignant neoplasm of upper-outer quadrant of unspecified female breast: Secondary | ICD-10-CM | POA: Insufficient documentation

## 2013-05-04 MED ORDER — SODIUM CHLORIDE 0.9 % IV SOLN
250.0000 mL | Freq: Once | INTRAVENOUS | Status: AC
  Administered 2013-05-04: 250 mL via INTRAVENOUS

## 2013-05-04 MED ORDER — POTASSIUM CHLORIDE CRYS ER 20 MEQ PO TBCR: 40.0000 meq | EXTENDED_RELEASE_TABLET | Freq: Two times a day (BID) | ORAL | Status: DC

## 2013-05-04 MED ORDER — SODIUM CHLORIDE 0.9 % IJ SOLN
10.0000 mL | INTRAMUSCULAR | Status: AC | PRN
  Administered 2013-05-04: 10 mL
  Filled 2013-05-04: qty 10

## 2013-05-04 MED ORDER — DIPHENHYDRAMINE HCL 25 MG PO CAPS
25.0000 mg | ORAL_CAPSULE | Freq: Once | ORAL | Status: AC
  Administered 2013-05-04: 25 mg via ORAL

## 2013-05-04 MED ORDER — ACETAMINOPHEN 325 MG PO TABS
ORAL_TABLET | ORAL | Status: AC
  Filled 2013-05-04: qty 2

## 2013-05-04 MED ORDER — ACETAMINOPHEN 325 MG PO TABS
650.0000 mg | ORAL_TABLET | Freq: Once | ORAL | Status: AC
  Administered 2013-05-04: 650 mg via ORAL

## 2013-05-04 MED ORDER — DIPHENHYDRAMINE HCL 25 MG PO CAPS
ORAL_CAPSULE | ORAL | Status: AC
  Filled 2013-05-04: qty 1

## 2013-05-04 MED ORDER — HEPARIN SOD (PORK) LOCK FLUSH 100 UNIT/ML IV SOLN
500.0000 [IU] | Freq: Every day | INTRAVENOUS | Status: AC | PRN
  Administered 2013-05-04: 500 [IU]
  Filled 2013-05-04: qty 5

## 2013-05-04 MED ORDER — FILGRASTIM 480 MCG/0.8ML IJ SOLN
480.0000 ug | Freq: Once | INTRAMUSCULAR | Status: AC
  Administered 2013-05-04: 480 ug via SUBCUTANEOUS
  Filled 2013-05-04: qty 0.8

## 2013-05-04 NOTE — Progress Notes (Signed)
On removal of huber needle from port - observed that the area on the left of the incision had changed.  On prior encounters with this patient, this wound was dark and dry.  The wound now has a light yellow color and appeared to have a small dot of pus in the middle that I wiped away with gauze.  The patient reports it happened yesterday when blood was being drawn.  Spoke with the patient about the possibility and risk of infection.  She agreed to contact the office immediately for any sign of infection and to keep a close eye on the area.

## 2013-05-04 NOTE — Patient Instructions (Addendum)
Blood Transfusion Information WHAT IS A BLOOD TRANSFUSION? A transfusion is the replacement of blood or some of its parts. Blood is made up of multiple cells which provide different functions.  Red blood cells carry oxygen and are used for blood loss replacement.  White blood cells fight against infection.  Platelets control bleeding.  Plasma helps clot blood.  Other blood products are available for specialized needs, such as hemophilia or other clotting disorders. BEFORE THE TRANSFUSION  Who gives blood for transfusions?   You may be able to donate blood to be used at a later date on yourself (autologous donation).  Relatives can be asked to donate blood. This is generally not any safer than if you have received blood from a stranger. The same precautions are taken to ensure safety when a relative's blood is donated.  Healthy volunteers who are fully evaluated to make sure their blood is safe. This is blood bank blood. Transfusion therapy is the safest it has ever been in the practice of medicine. Before blood is taken from a donor, a complete history is taken to make sure that person has no history of diseases nor engages in risky social behavior (examples are intravenous drug use or sexual activity with multiple partners). The donor's travel history is screened to minimize risk of transmitting infections, such as malaria. The donated blood is tested for signs of infectious diseases, such as HIV and hepatitis. The blood is then tested to be sure it is compatible with you in order to minimize the chance of a transfusion reaction. If you or a relative donates blood, this is often done in anticipation of surgery and is not appropriate for emergency situations. It takes many days to process the donated blood. RISKS AND COMPLICATIONS Although transfusion therapy is very safe and saves many lives, the main dangers of transfusion include:   Getting an infectious disease.  Developing a  transfusion reaction. This is an allergic reaction to something in the blood you were given. Every precaution is taken to prevent this. The decision to have a blood transfusion has been considered carefully by your caregiver before blood is given. Blood is not given unless the benefits outweigh the risks. AFTER THE TRANSFUSION  Right after receiving a blood transfusion, you will usually feel much better and more energetic. This is especially true if your red blood cells have gotten low (anemic). The transfusion raises the level of the red blood cells which carry oxygen, and this usually causes an energy increase.  The nurse administering the transfusion will monitor you carefully for complications. HOME CARE INSTRUCTIONS  No special instructions are needed after a transfusion. You may find your energy is better. Speak with your caregiver about any limitations on activity for underlying diseases you may have. SEEK MEDICAL CARE IF:   Your condition is not improving after your transfusion.  You develop redness or irritation at the intravenous (IV) site. SEEK IMMEDIATE MEDICAL CARE IF:  Any of the following symptoms occur over the next 12 hours:  Shaking chills.  You have a temperature by mouth above 102 F (38.9 C), not controlled by medicine.  Chest, back, or muscle pain.  People around you feel you are not acting correctly or are confused.  Shortness of breath or difficulty breathing.  Dizziness and fainting.  You get a rash or develop hives.  You have a decrease in urine output.  Your urine turns a dark color or changes to pink, red, or brown. Any of the following   symptoms occur over the next 10 days:  You have a temperature by mouth above 102 F (38.9 C), not controlled by medicine.  Shortness of breath.  Weakness after normal activity.  The white part of the eye turns yellow (jaundice).  You have a decrease in the amount of urine or are urinating less often.  Your  urine turns a dark color or changes to pink, red, or brown. Document Released: 07/18/2000 Document Revised: 10/13/2011 Document Reviewed: 03/06/2008 [employer] Patient Information 2014 [employer].  

## 2013-05-05 ENCOUNTER — Ambulatory Visit (HOSPITAL_BASED_OUTPATIENT_CLINIC_OR_DEPARTMENT_OTHER): Payer: BC Managed Care – PPO

## 2013-05-05 VITALS — BP 140/80 | HR 102 | Temp 99.0°F

## 2013-05-05 DIAGNOSIS — C50919 Malignant neoplasm of unspecified site of unspecified female breast: Secondary | ICD-10-CM

## 2013-05-05 DIAGNOSIS — D709 Neutropenia, unspecified: Secondary | ICD-10-CM

## 2013-05-05 DIAGNOSIS — C50912 Malignant neoplasm of unspecified site of left female breast: Secondary | ICD-10-CM

## 2013-05-05 LAB — TYPE AND SCREEN
ABO/RH(D): O NEG
Antibody Screen: NEGATIVE
Unit division: 0
Unit division: 0

## 2013-05-05 MED ORDER — FILGRASTIM 480 MCG/0.8ML IJ SOLN
480.0000 ug | Freq: Once | INTRAMUSCULAR | Status: AC
  Administered 2013-05-05: 480 ug via SUBCUTANEOUS
  Filled 2013-05-05: qty 0.8

## 2013-05-05 NOTE — Progress Notes (Signed)
Christine [name] here for Neupogen injection.  She had called earlier about some irritation at her port a cath incision site.  Stated that the nurse yesterday said that her incision site looked a little red(not where the needle was located).  She looked at it this morning and noted that near the end of the incision there is a spot that appears to look like a pus pocket.  Patient states that she wiped the area with an alcohol wipe and and poked it with a Q-tip.  No drainage noted.  She placed a bandage over the the site to keep her clothes from rubbing on it.  States that this is about the same place that she had problems before.  At that time she was instructed to put some Neosporin on it and cover it.  This nurse asked Zella Ball, RN to look at it as well.  She agreed that patient should do the same procedure that she had done previously. Patient in agreement with this plan and knows that if doesn't get better to call us or the surgeon that placed the port.

## 2013-05-06 ENCOUNTER — Ambulatory Visit (HOSPITAL_BASED_OUTPATIENT_CLINIC_OR_DEPARTMENT_OTHER): Payer: BC Managed Care – PPO

## 2013-05-06 VITALS — BP 137/79 | HR 101 | Temp 98.4°F

## 2013-05-06 DIAGNOSIS — D709 Neutropenia, unspecified: Secondary | ICD-10-CM

## 2013-05-06 DIAGNOSIS — C50912 Malignant neoplasm of unspecified site of left female breast: Secondary | ICD-10-CM

## 2013-05-06 MED ORDER — FILGRASTIM 480 MCG/0.8ML IJ SOLN
480.0000 ug | Freq: Once | INTRAMUSCULAR | Status: AC
  Administered 2013-05-06: 480 ug via SUBCUTANEOUS
  Filled 2013-05-06: qty 0.8

## 2013-05-09 ENCOUNTER — Telehealth: Payer: Self-pay | Admitting: *Deleted

## 2013-05-09 ENCOUNTER — Other Ambulatory Visit: Payer: Self-pay | Admitting: Emergency Medicine

## 2013-05-09 NOTE — Telephone Encounter (Signed)
Per staff message and POF I have scheduled appts.  JMW  

## 2013-05-10 ENCOUNTER — Telehealth: Payer: Self-pay | Admitting: *Deleted

## 2013-05-10 ENCOUNTER — Ambulatory Visit: Payer: BC Managed Care – PPO

## 2013-05-10 ENCOUNTER — Encounter: Payer: Self-pay | Admitting: Adult Health

## 2013-05-10 ENCOUNTER — Ambulatory Visit (HOSPITAL_BASED_OUTPATIENT_CLINIC_OR_DEPARTMENT_OTHER): Payer: BC Managed Care – PPO | Admitting: Adult Health

## 2013-05-10 ENCOUNTER — Encounter (INDEPENDENT_AMBULATORY_CARE_PROVIDER_SITE_OTHER): Payer: BC Managed Care – PPO | Admitting: General Surgery

## 2013-05-10 ENCOUNTER — Other Ambulatory Visit (HOSPITAL_BASED_OUTPATIENT_CLINIC_OR_DEPARTMENT_OTHER): Payer: BC Managed Care – PPO | Admitting: Lab

## 2013-05-10 VITALS — BP 133/84 | HR 102 | Temp 98.8°F | Resp 19 | Ht 64.0 in | Wt 207.8 lb

## 2013-05-10 DIAGNOSIS — D696 Thrombocytopenia, unspecified: Secondary | ICD-10-CM

## 2013-05-10 DIAGNOSIS — C50919 Malignant neoplasm of unspecified site of unspecified female breast: Secondary | ICD-10-CM

## 2013-05-10 DIAGNOSIS — E876 Hypokalemia: Secondary | ICD-10-CM

## 2013-05-10 DIAGNOSIS — C50912 Malignant neoplasm of unspecified site of left female breast: Secondary | ICD-10-CM

## 2013-05-10 DIAGNOSIS — Z171 Estrogen receptor negative status [ER-]: Secondary | ICD-10-CM

## 2013-05-10 LAB — CBC WITH DIFFERENTIAL/PLATELET
BASO%: 0.3 % (ref 0.0–2.0)
EOS%: 0.2 % (ref 0.0–7.0)
HCT: 34.3 % — ABNORMAL LOW (ref 34.8–46.6)
LYMPH%: 21.5 % (ref 14.0–49.7)
MCH: 31.1 pg (ref 25.1–34.0)
MCHC: 32.4 g/dL (ref 31.5–36.0)
MCV: 96.1 fL (ref 79.5–101.0)
MONO#: 1.3 10*3/uL — ABNORMAL HIGH (ref 0.1–0.9)
Platelets: 79 10*3/uL — ABNORMAL LOW (ref 145–400)
RBC: 3.57 10*6/uL — ABNORMAL LOW (ref 3.70–5.45)
WBC: 9 10*3/uL (ref 3.9–10.3)
lymph#: 1.9 10*3/uL (ref 0.9–3.3)

## 2013-05-10 MED ORDER — ONDANSETRON 8 MG/NS 50 ML IVPB
INTRAVENOUS | Status: AC
  Filled 2013-05-10: qty 8

## 2013-05-10 MED ORDER — DEXAMETHASONE SODIUM PHOSPHATE 10 MG/ML IJ SOLN
INTRAMUSCULAR | Status: AC
  Filled 2013-05-10: qty 1

## 2013-05-10 MED ORDER — DIPHENOXYLATE-ATROPINE 2.5-0.025 MG PO TABS
ORAL_TABLET | ORAL | Status: AC
  Filled 2013-05-10: qty 1

## 2013-05-10 NOTE — Progress Notes (Unsigned)
Per Lillia Abed , NP it is Ok to treat pt today with chemo and cbc from today.

## 2013-05-10 NOTE — Progress Notes (Signed)
OFFICE PROGRESS NOTE  CC**  Christine Mew, MD [address] 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   PRIOR THERAPY:  1. Patient has history of right breast cancer patient underwent a mastectomy when she was 57 years old. This occurred during her pregnancy. After that patient did not take any kind of further treatments. She continued to do well.   2.Recently she noted changes in her nipple had become itchy and retracted. Because of this she underwent mammogram with ultrasound that showed 2 areas in the left breast one at 12:00 position and a second at 3:00 position. She also had an ultrasound performed that showed the diameter to be at least 6 cm and multiple left axillary lymph nodes that were abnormal. She then went on to have a biopsy performed on 09/30/2012. 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 Patient went on to have MRIs of the breasts performed and was noted to be marked diffuse enhancement involving majority of the left breast measuring 13.0 x 12.5 x 9.5 cm. There was diffuse skin thickening and dermal enhancement. Multiple enlarged level I left axillary lymph nodes. The largest lymph node measured 2.4 cm in size. The large area of enhancement within the left breast extends posteriorly to abut the anterior portion of the pectoralis muscle. There was no evidence of internal mammary adenopathy or right axillary adenopathy.  PET/CT did show the left breast mass, and left axillary and subpectoralis nodal metastases, but no further areas throughout the chest/abd/pelvis of metastases.   3.  Patient currently undergoing  neoadjuvant chemotherapy with FEC x 6 cycles starting on 11/02/12, which will be followed by 12 cycles of weekly Taxol/Carbo that started on 02/08/13.  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  She will begin Gemzar/Carbo on 05/10/13.  She will receive 2 cycles.    CURRENT THERAPY: Gemzar carbo cycle 1 day 1  INTERVAL HISTORY: Christine [name] 57 y.o. female returns for follow up today prior to chemotherapy. She continues to feel fatigued.  She received PRBC transfusion last week and feels improved.  She stated that last week her port began to open up as it had previously.  She states there is no redness or drainage coming from the port site.  She denies fevers, chills, nausea, vomiting, constipation, diarrhea, or further concerns.    MEDICAL HISTORY: Past Medical History  Diagnosis Date  . ALLERGIC RHINITIS   . GERD (gastroesophageal reflux disease)   . Hypertension   . Hypothyroidism   . Hx of colonic polyps   . Wears glasses   . Breast cancer 6, age 26    right  . Breast cancer 09/30/12    left, ER/PR -, Her 2 -    ALLERGIES:  is allergic to sulfa antibiotics and sulfonamide derivatives.  MEDICATIONS:  Current Outpatient Prescriptions  Medication Sig Dispense Refill  . dexamethasone (DECADRON) 4 MG tablet TAKE 2 TABS PO QD ON DAY AFTER CHEMO THEN 2 TABS BID X 2 DAYS  30 tablet  1  . diltiazem (CARDIZEM CD) 240 MG 24 hr capsule TAKE ONE CAPSULE BY MOUTH EVERY DAY  90 capsule  3  . fluconazole (DIFLUCAN) 200 MG tablet Take 1 tablet (200 mg total) by mouth every other day.  10 tablet  2  . fluticasone (FLONASE) 50 MCG/ACT nasal spray Place 2 sprays into the nose daily.  16 g  2  . gabapentin (NEURONTIN) 300 MG capsule Take 1 capsule (300 mg total) by mouth 3 (three) times daily. 3 month supply  270 capsule  0  . latanoprost (XALATAN) 0.005 % ophthalmic solution Place 1 drop into both eyes at bedtime.       Marland Kitchen levothyroxine (SYNTHROID,  LEVOTHROID) 50 MCG tablet TAKE 1 TABLET BY MOUTH EVERY DAY  90 tablet  2  . lidocaine-prilocaine (EMLA) cream Apply topically as needed.  30 g  6  . LORazepam (ATIVAN) 0.5 MG tablet Take 1 tablet (0.5 mg total) by mouth every 6 (six) hours as needed (Nausea or vomiting).  30 tablet  0  . losartan-hydrochlorothiazide (HYZAAR) 50-12.5 MG per tablet Take 1 tablet by mouth daily.  90 tablet  0  . NEXIUM 40 MG capsule TAKE 1 CAPSULE (40 MG TOTAL) BY MOUTH DAILY BEFORE BREAKFAST.  90 capsule  1  . nystatin (MYCOSTATIN) 100000 UNIT/ML suspension Take 5 mLs (500,000 Units total) by mouth 4 (four) times daily.  240 mL  1  . potassium chloride SA (K-DUR,KLOR-CON) 20 MEQ tablet Take 2 tablets (40 mEq total) by mouth 2 (two) times daily.  120 tablet  0  . doxycycline (VIBRA-TABS) 100 MG tablet Take 1 tablet (100 mg total) by mouth 2 (two) times daily.  14 tablet  2   No current facility-administered medications for this visit.    SURGICAL HISTORY:  Past Surgical History  Procedure Laterality Date  . Tonsillectomy    . Caesarean section      x 1  . Mastectomy  04/1987    right side  . Abdominal hysterectomy  06/1988    fibroids  . Portacath placement Right 10/28/2012    Procedure: PORT PLACEMENT;  Surgeon: Clovis Pu. Cornett, MD;  Location: [facility];  Service: General;  Laterality: Right;  Right Subclavian Vein    REVIEW OF SYSTEMS:  A 10 point review of systems was conducted and is otherwise negative except for what is noted above.     PHYSICAL EXAMINATION: Blood pressure 133/84, pulse 102, temperature 98.8 F (37.1 C), temperature source Oral, resp. rate 19, height 5\' 4"  (1.626 m), weight 207 lb 12.8 oz (94.257 kg). Body mass index is 35.65 kg/(m^2). General: Patient is a pale appearing female in no acute distress HEENT: PERRLA, sclerae anicteric no conjunctival pallor, MMM. Neck: supple, no palpable adenopathy Lungs: clear to auscultation bilaterally, no wheezes, rhonchi, or  rales Cardiovascular: regular rate rhythm, S1, S2, no murmurs, rubs or gallops Abdomen: Soft, non-tender, non-distended, normoactive bowel sounds, no HSM Extremities: warm and well perfused, no clubbing, cyanosis, or edema Skin: No rashes or lesions, port site covered with emla cream, unable to evaluate Neuro: Non-focal Breasts: Right mastectomy site without nodularity or sign of recurrence.  Left breast mass palpable along with dimpling of skin and inversion of the nipple--much softer and smaller.  ECOG PERFORMANCE STATUS: 1 - Symptomatic but completely ambulatory  LABORATORY DATA: Lab Results  Component Value Date   WBC 9.0 05/10/2013   HGB 11.1* 05/10/2013   HCT 34.3* 05/10/2013   MCV 96.1 05/10/2013   PLT 79* 05/10/2013      Chemistry      Component Value Date/Time   NA 145 05/03/2013  1304   NA 144 10/28/2012 1248   K 3.0* 05/03/2013 1304   K 3.9 10/28/2012 1248   CL 101 01/25/2013 0952   CL 108 10/28/2012 1248   CO2 30* 05/03/2013 1304   CO2 29 09/13/2011 1910   BUN 8.1 05/03/2013 1304   BUN 10 10/28/2012 1248   CREATININE 0.7 05/03/2013 1304   CREATININE 0.80 10/28/2012 1248      Component Value Date/Time   CALCIUM 8.4 05/03/2013 1304   CALCIUM 9.4 09/13/2011 1910   ALKPHOS 80 05/03/2013 1304   ALKPHOS 78 09/13/2011 1910   AST 22 05/03/2013 1304   AST 18 09/13/2011 1910   ALT 17 05/03/2013 1304   ALT 12 09/13/2011 1910   BILITOT 0.39 05/03/2013 1304   BILITOT 0.3 09/13/2011 1910       RADIOGRAPHIC STUDIES:  Ct Chest W Contrast  10/27/2012  *RADIOLOGY REPORT*  Clinical Data:  High risk breast cancer staging.  Right breast cancer diagnosed 1988.  Left breast cancer diagnosed February 2014.  CT CHEST, ABDOMEN AND PELVIS WITH CONTRAST  Technique:  Multidetector CT imaging of the chest, abdomen and pelvis was performed following the standard protocol during bolus administration of intravenous contrast.  Contrast: OMNIPAQUE IOHEXOL 300 MG/ML  SOLN  Comparison:  PET CT scan 03/26 1014  CT  CHEST  Findings:  Right breast mastectomy anatomy.  There is thickening of the skin of the left breast to 11 mm.  There is a mass within the deep posterior left breast measuring 29 x 30 mm (image 6).  There are enlarged round abnormal lymph nodes in the left axilla measuring up to 20 mm short axis (image 22).  Lymph nodes extend beneath the sub pectoralis muscles on the left (image 12).  There is no evidence of internal mammary lymphadenopathy.  No infraclavicular adenopathy.  No mediastinal adenopathy.  No pericardial fluid.  No suspicious pulmonary nodules.  IMPRESSION:  1.  Left breast mass consistent with breast carcinoma. 2.  Skin thickening over the left breast is concerning for inflammatory breast cancer. 3.  Nodal metastasis in the left axilla and sub pectoralis nodal stations. 4.  No evidence of central nodal metastasis or mediastinal metastasis  CT ABDOMEN AND PELVIS  Findings:  No focal hepatic lesion.  The gallbladder, pancreas, spleen, adrenal gland is, and kidneys are normal.  The stomach, small bowel, appendix, cecum are normal.  The colon and rectosigmoid colon are normal.  Abdominal aorta is normal caliber.  No retroperitoneal or periportal lymphadenopathy.  No mesenteric or peritoneal disease.  No free fluid in  the pelvis.  Post hysterectomy anatomy.  No pelvic lymphadenopathy. Review of  bone windows demonstrates no aggressive osseous lesions.  IMPRESSION:  No evidence of metastasis in the abdomen or pelvis.   Original Report Authenticated By: Genevive Bi, M.D.    Ct Abdomen Pelvis W Contrast  10/27/2012  *RADIOLOGY REPORT*  Clinical Data:  High risk breast cancer staging.  Right breast cancer diagnosed 1988.  Left breast cancer diagnosed February 2014.  CT CHEST, ABDOMEN AND PELVIS WITH CONTRAST  Technique:  Multidetector CT imaging of the chest, abdomen and pelvis was performed following the standard protocol during bolus administration of intravenous contrast.  Contrast: OMNIPAQUE  IOHEXOL 300 MG/ML  SOLN  Comparison:  PET CT scan 03/26 1014  CT CHEST  Findings:  Right breast mastectomy anatomy.  There is thickening of the skin of the left breast to 11 mm.  There is a mass within the  deep posterior left breast measuring 29 x 30 mm (image 6).  There are enlarged round abnormal lymph nodes in the left axilla measuring up to 20 mm short axis (image 22).  Lymph nodes extend beneath the sub pectoralis muscles on the left (image 12).  There is no evidence of internal mammary lymphadenopathy.  No infraclavicular adenopathy.  No mediastinal adenopathy.  No pericardial fluid.  No suspicious pulmonary nodules.  IMPRESSION:  1.  Left breast mass consistent with breast carcinoma. 2.  Skin thickening over the left breast is concerning for inflammatory breast cancer. 3.  Nodal metastasis in the left axilla and sub pectoralis nodal stations. 4.  No evidence of central nodal metastasis or mediastinal metastasis  CT ABDOMEN AND PELVIS  Findings:  No focal hepatic lesion.  The gallbladder, pancreas, spleen, adrenal gland is, and kidneys are normal.  The stomach, small bowel, appendix, cecum are normal.  The colon and rectosigmoid colon are normal.  Abdominal aorta is normal caliber.  No retroperitoneal or periportal lymphadenopathy.  No mesenteric or peritoneal disease.  No free fluid in  the pelvis.  Post hysterectomy anatomy.  No pelvic lymphadenopathy. Review of  bone windows demonstrates no aggressive osseous lesions.  IMPRESSION:  No evidence of metastasis in the abdomen or pelvis.   Original Report Authenticated By: Genevive Bi, M.D.    Mr Breast Bilateral W Wo Contrast  10/12/2012  *RADIOLOGY REPORT*  Clinical Data: History of previous malignant mastectomy of the right breast 25 years ago.  The patient developed diffuse skin thickening and firmness within the left breast.  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  BUN and creatinine were obtained on site at [facility] at [address] Results:  BUN 7 mg/dL,  Creatinine 1.0 mg/dL.  BILATERAL BREAST MRI WITH AND WITHOUT CONTRAST  Technique: Multiplanar, multisequence MR images of both breasts were obtained prior to and following the intravenous administration of 20ml of Multihance.  Three dimensional images were evaluated at the independent DynaCad workstation.  Comparison:  Mammograms dated 09/30/2012 and 09/28/2012.  Findings: There has been a previous right mastectomy.  There is marked diffuse enhancement involving the majority of the left breast.  This measures 13.0 x 12.5 x 9.5 cm in size.  This is associated with a mixture of plateau and washout enhancement kinetics.  In addition,  there is diffuse skin thickening and dermal enhancement.  There are multiple enlarged level I left axillary lymph nodes.  The largest lymph node measures 2.4 cm in size.  The large area of enhancement within the left breast extends posteriorly to abut the anterior portion of the pectoral muscle. There is no evidence for internal mammary adenopathy or right axillary adenopathy.  There are no additional findings.  IMPRESSION:  1.  Large ( 13 cm) area of enhancement involving the majority of the left breast consistent with the patient's known invasive ductal carcinoma and DCIS. This extends posteriorly to abut the pectoralis muscle. 2.  Multiple enlarged level I left axillary lymph nodes. 3.  Previous right mastectomy.  RECOMMENDATION: Treatment plan  THREE-DIMENSIONAL MR IMAGE RENDERING ON INDEPENDENT WORKSTATION:  Three-dimensional MR images were rendered by post-processing of the original MR data on an independent workstation.  The three- dimensional MR images were interpreted, and findings were reported in the accompanying complete MRI report for this study.  BI-RADS CATEGORY 6:  Known biopsy-proven malignancy - appropriate  action should  be taken.   Original Report Authenticated By: Rolla Plate, M.D.    Nm Pet Image Initial (pi) Skull Base To Thigh  10/27/2012  *RADIOLOGY REPORT*  Clinical Data: Initial treatment strategy for high risk of breast cancer. Triple negative  NUCLEAR MEDICINE PET SKULL BASE TO THIGH  Fasting Blood Glucose:  97  Technique:  17.9 mCi F-18 FDG was injected intravenously. CT data was obtained and used for attenuation correction and anatomic localization only.  (This was not acquired as a diagnostic CT examination.) Additional exam technical data entered on technologist worksheet.  Comparison:  Findings:  Neck: No hypermetabolic lymph nodes in the neck.  Chest:  There is intense metabolic activity ( SUV max = 16.1) associated with a large portion of the glandular tissue of the left breast consistent with carcinoma.  There are intensely hypermetabolic left axillary lymph nodes with SUV max = 15.1.  These nodes are rounded and measure up to 2 cm. Hypermetabolic nodes extend to the sub pectoralis nodal station (image 71).  There is a single hypermetabolic internal mammary lymph node measuring 5 mm just left of the sternum (image 87).  This has intense metabolic activity for size with SUV max = 7.1.  No hypermetabolic mediastinal lymph nodes.  No hypermetabolic pulmonary nodules.  Abdomen/Pelvis:  No abnormal hypermetabolic activity within the liver, pancreas, adrenal glands, or spleen.  No hypermetabolic lymph nodes in the abdomen or pelvis.  Skeleton:  No focal hypermetabolic activity to suggest skeletal metastasis.  IMPRESSION:  1.  Hypermetabolic breast tissue on the left consistent with primary carcinoma. 2.  Hypermetabolic nodal metastasis in the left axilla extending to the sub pectoralis nodes. 3.  Single hypermetabolic left internal mammary lymph node.  4.  No evidence of pulmonary metastasis.  5.  No evidence of metastasis beneath hemidiaphragms or within the skeleton.   Original Report Authenticated By: Genevive Bi, M.D.    Dg Chest Port 1 View  10/28/2012  *RADIOLOGY REPORT*  Clinical Data: Port-A-Cath placement.  PORTABLE CHEST - 1 VIEW  Comparison: None.  Findings: The patient is rotated on the study.  Right side Port-A- Cath is in place.  Tubing appears intact.  Tip of the catheter projects in the mid to lower superior vena cava.  No pneumothorax identified.  Heart size is normal.  Lungs are clear.  The patient is status post right mastectomy and axillary dissection.  IMPRESSION: Tip of Port-A-Cath projects over the mid to lower superior vena cava.  The patient is rotated on the study.  Exact position of the tip of the catheter could be determined with the lateral film. No pneumothorax.   Original Report Authenticated By: Holley Dexter, M.D.    Dg Fluoro Guide Cv Line-no Report  10/28/2012  CLINICAL DATA: [facility]   FLOURO GUIDE CV LINE  Fluoroscopy was utilized by the requesting physician.  No radiographic  interpretation.      ASSESSMENT:   57 year old female with  #1 history of breast cancer of the right breast status post mastectomy when she was 57 years old. Due to this she was referred for genetic testing.   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 Patient's tumor is ER negative PR negative HER-2/neu negative with a Ki-67 of 21%. Patient is being seen in medical oncology for neoadjuvant chemotherapy. Patient's pathology was discussed with her in detail, as well as treatment  for breast cancer including surgical and with chemotherapy. Patient understands that she may still need a mastectomy however by giving her neoadjuvant chemotherapy her surgery may be made a little bit easier hopefully with giving her negative margins. Patient started dense FEC x6 cycles followed by weekly Taxol carboplatinum for 12 weeks.  We discontinued the Taxol carbo after 8 weeks due to progressive neuropathy.   She will start Gemzar carbo today.     #3 Patient underwent staging studies with PET/CT, and received echo, port placement and chemotherapy class.    #4 Hypokalemia  PLAN:   1.Doing well.  She continues to have thrombocytopenia.  I reviewed her labs with Dr. Welton Flakes who cleared her to proceed with chemotherapy.     2. Her MRI results were good.  Her tumor has decreased to 2 spiculated masses in her left breast, one 1.2 cm the other 1.3 cm.  This is excellent compared to the 13cm tumor prior to starting chemotherapy.    3.  She will return next week for labs and evaluation.  She has f/u with Dr. Luisa Hart on 05/20/13.    All questions were answered. The patient knows to call the clinic with any problems, questions or concerns. We can certainly see the patient much sooner if necessary.  I spent 25 minutes counseling the patient face to face. The total time spent in the appointment was 30 minutes.  Cherie Ouch Lyn Hollingshead, NP Medical Oncology [facility] Phone: [phone] 05/11/2013, 9:58 PM

## 2013-05-10 NOTE — Telephone Encounter (Signed)
appts made and printed...td 

## 2013-05-11 ENCOUNTER — Ambulatory Visit (INDEPENDENT_AMBULATORY_CARE_PROVIDER_SITE_OTHER): Payer: BC Managed Care – PPO | Admitting: General Surgery

## 2013-05-11 ENCOUNTER — Encounter (INDEPENDENT_AMBULATORY_CARE_PROVIDER_SITE_OTHER): Payer: Self-pay | Admitting: General Surgery

## 2013-05-11 ENCOUNTER — Ambulatory Visit: Payer: BC Managed Care – PPO

## 2013-05-11 VITALS — BP 136/70 | HR 101 | Temp 98.8°F | Resp 18 | Ht 63.0 in | Wt 203.0 lb

## 2013-05-11 DIAGNOSIS — C50919 Malignant neoplasm of unspecified site of unspecified female breast: Secondary | ICD-10-CM

## 2013-05-11 DIAGNOSIS — C50912 Malignant neoplasm of unspecified site of left female breast: Secondary | ICD-10-CM

## 2013-05-11 MED ORDER — DOXYCYCLINE HYCLATE 100 MG PO TABS: 100.0000 mg | ORAL_TABLET | Freq: Two times a day (BID) | ORAL | Status: DC

## 2013-05-11 NOTE — Progress Notes (Signed)
Subjective:     Patient ID: Christine [name], female   DOB: March 02, 1956, 57 y.o.   MRN: [id number]  HPI The patient is a 57 year old black female who has a locally advanced left breast cancer. She has been treated with neoadjuvant chemotherapy. She was scheduled to receive her last dose of chemotherapy this week but the oncologists noted that there was some slight redness to the skin around her port site as well as some slight separation to the medial part of the incision near the port. She denies any fevers or chills. She's had minimal drainage from the area. She did not get her last dose of chemotherapy but she did get blood through the port last week and she tolerated this fine.  Review of Systems  Constitutional: Negative.   HENT: Negative.   Eyes: Negative.   Respiratory: Negative.   Cardiovascular: Negative.   Gastrointestinal: Negative.   Endocrine: Negative.   Genitourinary: Negative.   Musculoskeletal: Negative.   Skin: Negative.   Allergic/Immunologic: Negative.   Neurological: Negative.   Hematological: Negative.   Psychiatric/Behavioral: Negative.        Objective:   Physical Exam  Constitutional: She is oriented to person, place, and time. She appears well-developed and well-nourished.  HENT:  Head: Normocephalic and atraumatic.  Eyes: Conjunctivae and EOM are normal. Pupils are equal, round, and reactive to light.  Neck: Normal range of motion. Neck supple.  Cardiovascular: Normal rate, regular rhythm and normal heart sounds.   Pulmonary/Chest: Effort normal and breath sounds normal.  There is only some very slight redness around the port on the right chest wall. There is about a millimeter of skin separation at the medial portion of the incision but no drainage.  Abdominal: Soft. Bowel sounds are normal.  Musculoskeletal: Normal range of motion.  Neurological: She is alert and oriented to person, place, and time.  Skin: Skin is warm and dry.  Psychiatric: She has a  normal mood and affect. Her behavior is normal.       Assessment:     The patient has a locally advanced left breast cancer and may have a low-grade infection of her Port-A-Cath     Plan:     At this point we will put her on doxycycline. She has a followup appointment next week with Dr. Luisa Hart at which time she can discuss whether the port should be removed and the timing of her potential mastectomy. She agrees to call us if she develops fever, pain, or drainage at the site.

## 2013-05-12 NOTE — Progress Notes (Signed)
Addendum:  Received call from Lorenza Evangelist RN about patient's port having yellow drainage in treatment room.  I requested she have a provider evaluate.   Cherie Ouch Lyn Hollingshead, NP Medical Oncology [facility] Phone: [phone]

## 2013-05-17 ENCOUNTER — Other Ambulatory Visit: Payer: BC Managed Care – PPO

## 2013-05-17 ENCOUNTER — Ambulatory Visit: Payer: BC Managed Care – PPO

## 2013-05-17 ENCOUNTER — Other Ambulatory Visit: Payer: BC Managed Care – PPO | Admitting: Lab

## 2013-05-17 ENCOUNTER — Encounter: Payer: Self-pay | Admitting: Adult Health

## 2013-05-17 ENCOUNTER — Ambulatory Visit (HOSPITAL_BASED_OUTPATIENT_CLINIC_OR_DEPARTMENT_OTHER): Payer: BC Managed Care – PPO | Admitting: Adult Health

## 2013-05-17 ENCOUNTER — Ambulatory Visit: Payer: BC Managed Care – PPO | Admitting: Adult Health

## 2013-05-17 ENCOUNTER — Other Ambulatory Visit (HOSPITAL_BASED_OUTPATIENT_CLINIC_OR_DEPARTMENT_OTHER): Payer: BC Managed Care – PPO | Admitting: Lab

## 2013-05-17 VITALS — BP 148/94 | HR 106 | Temp 97.9°F | Resp 18 | Ht 63.0 in | Wt 206.6 lb

## 2013-05-17 DIAGNOSIS — C773 Secondary and unspecified malignant neoplasm of axilla and upper limb lymph nodes: Secondary | ICD-10-CM

## 2013-05-17 DIAGNOSIS — E876 Hypokalemia: Secondary | ICD-10-CM

## 2013-05-17 DIAGNOSIS — D709 Neutropenia, unspecified: Secondary | ICD-10-CM

## 2013-05-17 DIAGNOSIS — C50919 Malignant neoplasm of unspecified site of unspecified female breast: Secondary | ICD-10-CM

## 2013-05-17 DIAGNOSIS — C50912 Malignant neoplasm of unspecified site of left female breast: Secondary | ICD-10-CM

## 2013-05-17 LAB — CBC WITH DIFFERENTIAL/PLATELET
BASO%: 0.7 % (ref 0.0–2.0)
EOS%: 0.2 % (ref 0.0–7.0)
Eosinophils Absolute: 0 10*3/uL (ref 0.0–0.5)
HCT: 31.9 % — ABNORMAL LOW (ref 34.8–46.6)
MCH: 32 pg (ref 25.1–34.0)
MCHC: 33.7 g/dL (ref 31.5–36.0)
MONO#: 0.6 10*3/uL (ref 0.1–0.9)
NEUT%: 70.3 % (ref 38.4–76.8)
RDW: 19.2 % — ABNORMAL HIGH (ref 11.2–14.5)
WBC: 7.9 10*3/uL (ref 3.9–10.3)
lymph#: 1.7 10*3/uL (ref 0.9–3.3)

## 2013-05-17 LAB — COMPREHENSIVE METABOLIC PANEL (CC13)
ALT: 13 U/L (ref 0–55)
AST: 19 U/L (ref 5–34)
Albumin: 3.6 g/dL (ref 3.5–5.0)
Anion Gap: 12 mEq/L — ABNORMAL HIGH (ref 3–11)
CO2: 26 mEq/L (ref 22–29)
Calcium: 9.1 mg/dL (ref 8.4–10.4)
Chloride: 104 mEq/L (ref 98–109)
Glucose: 182 mg/dl — ABNORMAL HIGH (ref 70–140)
Potassium: 3.1 mEq/L — ABNORMAL LOW (ref 3.5–5.1)
Sodium: 143 mEq/L (ref 136–145)
Total Protein: 7.1 g/dL (ref 6.4–8.3)

## 2013-05-17 NOTE — Progress Notes (Signed)
OFFICE PROGRESS NOTE  CC**  [name], MD [address] 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   PRIOR THERAPY:  1. Patient has history of right breast cancer patient underwent a mastectomy when she was 57 years old. This occurred during her pregnancy. After that patient did not take any kind of further treatments. She continued to do well.   2.Recently she noted changes in her nipple had become itchy and retracted. Because of this she underwent mammogram with ultrasound that showed 2 areas in the left breast one at 12:00 position and a second at 3:00 position. She also had an ultrasound performed that showed the diameter to be at least 6 cm and multiple left axillary lymph nodes that were abnormal. She then went on to have a biopsy performed on 09/30/2012. 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 Patient went on to have MRIs of the breasts performed and was noted to be marked diffuse enhancement involving majority of the left breast measuring 13.0 x 12.5 x 9.5 cm. There was diffuse skin thickening and dermal enhancement. Multiple enlarged level I left axillary lymph nodes. The largest lymph node measured 2.4 cm in size. The large area of enhancement within the left breast extends posteriorly to abut the anterior portion of the pectoralis muscle. There was no evidence of internal mammary adenopathy or right axillary adenopathy.  PET/CT did show the left breast mass, and left axillary and subpectoralis nodal metastases, but no further areas throughout the chest/abd/pelvis of metastases.   3.  Patient currently undergoing  neoadjuvant chemotherapy with FEC x 6 cycles starting on 11/02/12, which will be followed by 12 cycles of weekly Taxol/Carbo that started on 02/08/13.  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  She will begin Gemzar/Carbo on 05/24/13.  She will receive 2 cycles.    CURRENT THERAPY: Gemzar carbo cycle 1 day 1 hopefully to start 05/24/13  INTERVAL HISTORY: Christine [name] 57 y.o. female returns for follow up today.  She did not receive Gemzar Carbo last week due to yellow drainage from her port.  She was evaluated by Dr. Carolynne Edouard who placed her on Doxycycline.  She has taken this antibiotic as directed and her port has started to close up.  She has noticed no further drainage or erythema.  She denies fevers, chills, nausea, vomiting, constipation, diarrhea, or further concerns.    MEDICAL HISTORY: Past Medical History  Diagnosis Date  . ALLERGIC RHINITIS   . GERD (gastroesophageal reflux disease)   . Hypertension   . Hypothyroidism   . Hx of colonic polyps   . Wears glasses   . Breast cancer 13, age 81    right  . Breast cancer 09/30/12    left, ER/PR -, Her 2 -    ALLERGIES:  is allergic to sulfa antibiotics and sulfonamide derivatives.  MEDICATIONS:  Current Outpatient Prescriptions  Medication Sig Dispense Refill  . dexamethasone (DECADRON) 4 MG tablet TAKE 2 TABS PO QD ON DAY AFTER CHEMO THEN 2 TABS BID X 2 DAYS  30 tablet  1  . diltiazem (CARDIZEM CD) 240 MG 24 hr capsule TAKE  ONE CAPSULE BY MOUTH EVERY DAY  90 capsule  3  . doxycycline (VIBRA-TABS) 100 MG tablet Take 1 tablet (100 mg total) by mouth 2 (two) times daily.  14 tablet  2  . fluticasone (FLONASE) 50 MCG/ACT nasal spray Place 2 sprays into the nose daily.  16 g  2  . gabapentin (NEURONTIN) 300 MG capsule Take 1 capsule (300 mg total) by mouth 3 (three) times daily. 3 month supply  270 capsule  0  . latanoprost (XALATAN) 0.005 % ophthalmic solution Place 1 drop into both eyes at  bedtime.       Marland Kitchen levothyroxine (SYNTHROID, LEVOTHROID) 50 MCG tablet TAKE 1 TABLET BY MOUTH EVERY DAY  90 tablet  2  . lidocaine-prilocaine (EMLA) cream Apply topically as needed.  30 g  6  . LORazepam (ATIVAN) 0.5 MG tablet Take 1 tablet (0.5 mg total) by mouth every 6 (six) hours as needed (Nausea or vomiting).  30 tablet  0  . losartan-hydrochlorothiazide (HYZAAR) 50-12.5 MG per tablet Take 1 tablet by mouth daily.  90 tablet  0  . NEXIUM 40 MG capsule TAKE 1 CAPSULE (40 MG TOTAL) BY MOUTH DAILY BEFORE BREAKFAST.  90 capsule  1  . nystatin (MYCOSTATIN) 100000 UNIT/ML suspension Take 5 mLs (500,000 Units total) by mouth 4 (four) times daily.  240 mL  1  . potassium chloride SA (K-DUR,KLOR-CON) 20 MEQ tablet Take 2 tablets (40 mEq total) by mouth 2 (two) times daily.  120 tablet  0  . fluconazole (DIFLUCAN) 200 MG tablet Take 1 tablet (200 mg total) by mouth every other day.  10 tablet  2   No current facility-administered medications for this visit.    SURGICAL HISTORY:  Past Surgical History  Procedure Laterality Date  . Tonsillectomy    . Caesarean section      x 1  . Mastectomy  04/1987    right side  . Abdominal hysterectomy  06/1988    fibroids  . Portacath placement Right 10/28/2012    Procedure: PORT PLACEMENT;  Surgeon: Clovis Pu. Cornett, MD;  Location: [facility];  Service: General;  Laterality: Right;  Right Subclavian Vein    REVIEW OF SYSTEMS:  A 10 point review of systems was conducted and is otherwise negative except for what is noted above.     PHYSICAL EXAMINATION: Blood pressure 148/94, pulse 106, temperature 97.9 F (36.6 C), temperature source Oral, resp. rate 18, height 5\' 3"  (1.6 m), weight 206 lb 9.6 oz (93.713 kg), SpO2 98.00%. Body mass index is 36.61 kg/(m^2). General: Patient is a well appearing female in no acute distress HEENT: PERRLA, sclerae anicteric no conjunctival pallor, MMM. Neck: supple, no palpable adenopathy Lungs: clear to  auscultation bilaterally, no wheezes, rhonchi, or rales Cardiovascular: regular rate rhythm, S1, S2, no murmurs, rubs or gallops Abdomen: Soft, non-tender, non-distended, normoactive bowel sounds, no HSM Extremities: warm and well perfused, no clubbing, cyanosis, or edema Skin: No rashes or lesions, right chest wall port, small open area along right lateral side of insertion incision, tissue appears to be healing, no drainage, applied pressure to and around port, no drainage.  No erythema noted.   Neuro: Non-focal Breasts: Right mastectomy site without nodularity or sign of recurrence.  Left breast mass palpable along with dimpling of skin and inversion of the nipple--much softer and smaller.  ECOG PERFORMANCE STATUS: 1 - Symptomatic but completely ambulatory  LABORATORY DATA: Lab Results  Component Value Date   WBC 7.9 05/17/2013  HGB 10.7* 05/17/2013   HCT 31.9* 05/17/2013   MCV 94.9 05/17/2013   PLT 129* 05/17/2013      Chemistry      Component Value Date/Time   NA 143 05/17/2013 0829   NA 144 10/28/2012 1248   K 3.1* 05/17/2013 0829   K 3.9 10/28/2012 1248   CL 101 01/25/2013 0952   CL 108 10/28/2012 1248   CO2 26 05/17/2013 0829   CO2 29 09/13/2011 1910   BUN 13.9 05/17/2013 0829   BUN 10 10/28/2012 1248   CREATININE 0.8 05/17/2013 0829   CREATININE 0.80 10/28/2012 1248      Component Value Date/Time   CALCIUM 9.1 05/17/2013 0829   CALCIUM 9.4 09/13/2011 1910   ALKPHOS 97 05/17/2013 0829   ALKPHOS 78 09/13/2011 1910   AST 19 05/17/2013 0829   AST 18 09/13/2011 1910   ALT 13 05/17/2013 0829   ALT 12 09/13/2011 1910   BILITOT 0.57 05/17/2013 0829   BILITOT 0.3 09/13/2011 1910       RADIOGRAPHIC STUDIES:  Ct Chest W Contrast  10/27/2012  *RADIOLOGY REPORT*  Clinical Data:  High risk breast cancer staging.  Right breast cancer diagnosed 1988.  Left breast cancer diagnosed February 2014.  CT CHEST, ABDOMEN AND PELVIS WITH CONTRAST  Technique:  Multidetector CT imaging of the  chest, abdomen and pelvis was performed following the standard protocol during bolus administration of intravenous contrast.  Contrast: OMNIPAQUE IOHEXOL 300 MG/ML  SOLN  Comparison:  PET CT scan 03/26 1014  CT CHEST  Findings:  Right breast mastectomy anatomy.  There is thickening of the skin of the left breast to 11 mm.  There is a mass within the deep posterior left breast measuring 29 x 30 mm (image 6).  There are enlarged round abnormal lymph nodes in the left axilla measuring up to 20 mm short axis (image 22).  Lymph nodes extend beneath the sub pectoralis muscles on the left (image 12).  There is no evidence of internal mammary lymphadenopathy.  No infraclavicular adenopathy.  No mediastinal adenopathy.  No pericardial fluid.  No suspicious pulmonary nodules.  IMPRESSION:  1.  Left breast mass consistent with breast carcinoma. 2.  Skin thickening over the left breast is concerning for inflammatory breast cancer. 3.  Nodal metastasis in the left axilla and sub pectoralis nodal stations. 4.  No evidence of central nodal metastasis or mediastinal metastasis  CT ABDOMEN AND PELVIS  Findings:  No focal hepatic lesion.  The gallbladder, pancreas, spleen, adrenal gland is, and kidneys are normal.  The stomach, small bowel, appendix, cecum are normal.  The colon and rectosigmoid colon are normal.  Abdominal aorta is normal caliber.  No retroperitoneal or periportal lymphadenopathy.  No mesenteric or peritoneal disease.  No free fluid in  the pelvis.  Post hysterectomy anatomy.  No pelvic lymphadenopathy. Review of  bone windows demonstrates no aggressive osseous lesions.  IMPRESSION:  No evidence of metastasis in the abdomen or pelvis.   Original Report Authenticated By: Genevive Bi, M.D.    Ct Abdomen Pelvis W Contrast  10/27/2012  *RADIOLOGY REPORT*  Clinical Data:  High risk breast cancer staging.  Right breast cancer diagnosed 1988.  Left breast cancer diagnosed February 2014.  CT CHEST, ABDOMEN AND  PELVIS WITH CONTRAST  Technique:  Multidetector CT imaging of the chest, abdomen and pelvis was performed following the standard protocol during bolus administration of intravenous contrast.  Contrast: OMNIPAQUE IOHEXOL 300 MG/ML  SOLN  Comparison:  PET CT scan 03/26 1014  CT CHEST  Findings:  Right breast mastectomy anatomy.  There is thickening of the skin of the left breast to 11 mm.  There is a mass within the deep posterior left breast measuring 29 x 30 mm (image 6).  There are enlarged round abnormal lymph nodes in the left axilla measuring up to 20 mm short axis (image 22).  Lymph nodes extend beneath the sub pectoralis muscles on the left (image 12).  There is no evidence of internal mammary lymphadenopathy.  No infraclavicular adenopathy.  No mediastinal adenopathy.  No pericardial fluid.  No suspicious pulmonary nodules.  IMPRESSION:  1.  Left breast mass consistent with breast carcinoma. 2.  Skin thickening over the left breast is concerning for inflammatory breast cancer. 3.  Nodal metastasis in the left axilla and sub pectoralis nodal stations. 4.  No evidence of central nodal metastasis or mediastinal metastasis  CT ABDOMEN AND PELVIS  Findings:  No focal hepatic lesion.  The gallbladder, pancreas, spleen, adrenal gland is, and kidneys are normal.  The stomach, small bowel, appendix, cecum are normal.  The colon and rectosigmoid colon are normal.  Abdominal aorta is normal caliber.  No retroperitoneal or periportal lymphadenopathy.  No mesenteric or peritoneal disease.  No free fluid in  the pelvis.  Post hysterectomy anatomy.  No pelvic lymphadenopathy. Review of  bone windows demonstrates no aggressive osseous lesions.  IMPRESSION:  No evidence of metastasis in the abdomen or pelvis.   Original Report Authenticated By: Genevive Bi, M.D.    Mr Breast Bilateral W Wo Contrast  10/12/2012  *RADIOLOGY REPORT*  Clinical Data: History of previous malignant mastectomy of the right breast 25  years ago.  The patient developed diffuse skin thickening and firmness within the left breast.  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  BUN and creatinine were obtained on site at [facility] at [address] Results:  BUN 7 mg/dL,  Creatinine 1.0 mg/dL.  BILATERAL BREAST MRI WITH AND WITHOUT CONTRAST  Technique: Multiplanar, multisequence MR images of both breasts were obtained prior to and following the intravenous administration of 20ml of Multihance.  Three dimensional images were evaluated at the independent DynaCad workstation.  Comparison:  Mammograms dated 09/30/2012 and 09/28/2012.  Findings: There has been a previous right mastectomy.  There is marked diffuse enhancement involving the majority of the left breast.  This measures 13.0 x 12.5 x 9.5 cm in size.  This is associated with a mixture of plateau and washout enhancement kinetics.  In addition,  there is diffuse skin thickening and dermal enhancement.  There are multiple enlarged level I left axillary lymph nodes.  The largest lymph node measures 2.4 cm in size.  The large area of enhancement within the left breast extends posteriorly to abut the anterior portion of the pectoral muscle. There is no evidence for internal mammary adenopathy or right axillary adenopathy.  There are no additional findings.  IMPRESSION:  1.  Large ( 13 cm) area of enhancement involving the majority of the left breast consistent with the patient's known invasive ductal carcinoma and DCIS. This extends posteriorly to abut the pectoralis muscle. 2.  Multiple enlarged level I left axillary lymph nodes. 3.  Previous right mastectomy.  RECOMMENDATION: Treatment plan  THREE-DIMENSIONAL MR IMAGE RENDERING ON INDEPENDENT WORKSTATION:  Three-dimensional MR images were rendered by post-processing of the original MR  data on an independent  workstation.  The three- dimensional MR images were interpreted, and findings were reported in the accompanying complete MRI report for this study.  BI-RADS CATEGORY 6:  Known biopsy-proven malignancy - appropriate action should be taken.   Original Report Authenticated By: Rolla Plate, M.D.    Nm Pet Image Initial (pi) Skull Base To Thigh  10/27/2012  *RADIOLOGY REPORT*  Clinical Data: Initial treatment strategy for high risk of breast cancer. Triple negative  NUCLEAR MEDICINE PET SKULL BASE TO THIGH  Fasting Blood Glucose:  97  Technique:  17.9 mCi F-18 FDG was injected intravenously. CT data was obtained and used for attenuation correction and anatomic localization only.  (This was not acquired as a diagnostic CT examination.) Additional exam technical data entered on technologist worksheet.  Comparison:  Findings:  Neck: No hypermetabolic lymph nodes in the neck.  Chest:  There is intense metabolic activity ( SUV max = 96.0) associated with a large portion of the glandular tissue of the left breast consistent with carcinoma.  There are intensely hypermetabolic left axillary lymph nodes with SUV max = 15.1.  These nodes are rounded and measure up to 2 cm. Hypermetabolic nodes extend to the sub pectoralis nodal station (image 71).  There is a single hypermetabolic internal mammary lymph node measuring 5 mm just left of the sternum (image 87).  This has intense metabolic activity for size with SUV max = 7.1.  No hypermetabolic mediastinal lymph nodes.  No hypermetabolic pulmonary nodules.  Abdomen/Pelvis:  No abnormal hypermetabolic activity within the liver, pancreas, adrenal glands, or spleen.  No hypermetabolic lymph nodes in the abdomen or pelvis.  Skeleton:  No focal hypermetabolic activity to suggest skeletal metastasis.  IMPRESSION:  1.  Hypermetabolic breast tissue on the left consistent with primary carcinoma. 2.  Hypermetabolic nodal metastasis in the left axilla extending to the sub pectoralis  nodes. 3.  Single hypermetabolic left internal mammary lymph node.  4.  No evidence of pulmonary metastasis.  5.  No evidence of metastasis beneath hemidiaphragms or within the skeleton.   Original Report Authenticated By: Genevive Bi, M.D.    Dg Chest Port 1 View  10/28/2012  *RADIOLOGY REPORT*  Clinical Data: Port-A-Cath placement.  PORTABLE CHEST - 1 VIEW  Comparison: None.  Findings: The patient is rotated on the study.  Right side Port-A- Cath is in place.  Tubing appears intact.  Tip of the catheter projects in the mid to lower superior vena cava.  No pneumothorax identified.  Heart size is normal.  Lungs are clear.  The patient is status post right mastectomy and axillary dissection.  IMPRESSION: Tip of Port-A-Cath projects over the mid to lower superior vena cava.  The patient is rotated on the study.  Exact position of the tip of the catheter could be determined with the lateral film. No pneumothorax.   Original Report Authenticated By: Holley Dexter, M.D.    Dg Fluoro Guide Cv Line-no Report  10/28/2012  CLINICAL DATA: [facility]   FLOURO GUIDE CV LINE  Fluoroscopy was utilized by the requesting physician.  No radiographic  interpretation.      ASSESSMENT:   57 year old female with  #1 history of breast cancer of the right breast status post mastectomy when she was 57 years old. Due to this she was referred for genetic testing.   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  Patient's tumor is ER negative PR negative HER-2/neu negative with a Ki-67 of 21%. Patient is being seen in medical oncology for neoadjuvant chemotherapy. Patient's pathology was discussed with her in detail, as well as treatment for breast cancer including surgical and with chemotherapy. Patient understands that she may still need a mastectomy however by giving her neoadjuvant chemotherapy her surgery may be made a  little bit easier hopefully with giving her negative margins. Patient started dense FEC x6 cycles followed by weekly Taxol carboplatinum for 12 weeks.  We discontinued the Taxol carbo after 8 weeks due to progressive neuropathy.  She will start Gemzar carbo hopefully on 05/24/13.     #3 Patient underwent staging studies with PET/CT, and received echo, port placement and chemotherapy class.    #4 Hypokalemia  PLAN:   1.Doing well.  Patient's labs are stable.  We could give chemotherapy today considering her port looks so much better, and give it peripherally instead of centrally.  However, we have given her chemotherapy peripherally before and had irritation and it left a mark on her arm, so she would prefer to wait and have Dr. Rosezena Sensor recommendation.    2. Her MRI results were good.  Her tumor has decreased to 2 spiculated masses in her left breast, one 1.2 cm the other 1.3 cm.  This is excellent compared to the 13cm tumor prior to starting chemotherapy.    3.  She will return next week for labs and evaluation and hopefully chemotherapy.  She has f/u with Dr. Luisa Hart on 05/20/13.    All questions were answered. The patient knows to call the clinic with any problems, questions or concerns. We can certainly see the patient much sooner if necessary.  I spent 25 minutes counseling the patient face to face. The total time spent in the appointment was 30 minutes.  Illa Level, NP Medical Oncology [facility] [phone]   05/17/2013, 9:38 AM

## 2013-05-18 ENCOUNTER — Ambulatory Visit: Payer: BC Managed Care – PPO

## 2013-05-20 ENCOUNTER — Ambulatory Visit (INDEPENDENT_AMBULATORY_CARE_PROVIDER_SITE_OTHER): Payer: BC Managed Care – PPO | Admitting: Surgery

## 2013-05-20 ENCOUNTER — Encounter (INDEPENDENT_AMBULATORY_CARE_PROVIDER_SITE_OTHER): Payer: Self-pay | Admitting: Surgery

## 2013-05-20 VITALS — BP 138/80 | HR 100 | Temp 97.8°F | Resp 15 | Ht 64.0 in | Wt 202.6 lb

## 2013-05-20 DIAGNOSIS — C50912 Malignant neoplasm of unspecified site of left female breast: Secondary | ICD-10-CM

## 2013-05-20 DIAGNOSIS — C50919 Malignant neoplasm of unspecified site of unspecified female breast: Secondary | ICD-10-CM

## 2013-05-20 NOTE — Progress Notes (Signed)
Subjective:     Patient ID: Christine [name], female   DOB: June 21, 1956, 57 y.o.   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  PRIOR THERAPY:  1. Patient has history of right breast cancer patient underwent a mastectomy when she was 57 years old. This occurred during her pregnancy. After that patient did not take any kind of further treatments. She continued to do well.  2.Recently she noted changes in her nipple had become itchy and retracted. Because of this she underwent mammogram with ultrasound that showed 2 areas in the left breast one at 12:00 position and a second at 3:00 position. She also had an ultrasound performed that showed the diameter to be at least 6 cm and multiple left axillary lymph nodes that were abnormal. She then went on to have a biopsy performed on 09/30/2012. 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 Patient went on to have MRIs of the breasts performed and was noted to be marked diffuse enhancement involving majority of the left breast measuring 13.0 x 12.5 x 9.5 cm. There was diffuse skin thickening and dermal enhancement. Multiple enlarged level I left axillary lymph nodes. The largest lymph node measured 2.4 cm in size. The large area of enhancement within the left breast extends posteriorly to abut the anterior portion of the pectoralis muscle. There was no evidence of internal mammary adenopathy or right axillary adenopathy. PET/CT did show the left breast mass, and left axillary and subpectoralis nodal metastases, but no further areas throughout the chest/abd/pelvis of metastases.  3. Patient currently undergoing neoadjuvant chemotherapy with  FEC x 6 cycles starting on 11/02/12, which will be followed by 12 cycles of weekly Taxol/Carbo that started on 02/08/13. 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 She will begin Gemzar/Carbo on 05/24/13. She will receive 2 cycles.  CURRENT THERAPY: Gemzar carbo cycle 1 day 1 hopefully to start 05/24/13 She is feeling better. They have not used reports for fear of infection. She has gotten blood products though in the recent past. She is to go back next week to be evaluated for further chemotherapy.  Review of Systems  Constitutional: Positive for fatigue.  Respiratory: Negative.   Cardiovascular: Negative.        Objective:   Physical Exam  Constitutional: She appears well-developed and well-nourished.  Eyes: Pupils are equal, round, and reactive to light. No scleral icterus.  Pulmonary/Chest:    Lymphadenopathy:    She has no axillary adenopathy.       Right axillary: No lateral adenopathy present.       Left axillary: No lateral adenopathy present.      Assessment:     Left breast cancer stage III status post neoadjuvant chemotherapy with significant reduction in size    Plan:     OK to finish chemotherapy through port. Return one month to talk about left modified radical mastectomy and the timing of this.

## 2013-05-20 NOTE — Patient Instructions (Signed)
Ok to use port.  Return 1 month.

## 2013-05-24 ENCOUNTER — Telehealth: Payer: Self-pay | Admitting: Oncology

## 2013-05-24 ENCOUNTER — Ambulatory Visit (HOSPITAL_BASED_OUTPATIENT_CLINIC_OR_DEPARTMENT_OTHER): Payer: BC Managed Care – PPO

## 2013-05-24 ENCOUNTER — Other Ambulatory Visit (HOSPITAL_BASED_OUTPATIENT_CLINIC_OR_DEPARTMENT_OTHER): Payer: BC Managed Care – PPO | Admitting: Lab

## 2013-05-24 ENCOUNTER — Encounter: Payer: Self-pay | Admitting: Adult Health

## 2013-05-24 ENCOUNTER — Ambulatory Visit (HOSPITAL_BASED_OUTPATIENT_CLINIC_OR_DEPARTMENT_OTHER): Payer: BC Managed Care – PPO | Admitting: Adult Health

## 2013-05-24 ENCOUNTER — Telehealth: Payer: Self-pay | Admitting: *Deleted

## 2013-05-24 ENCOUNTER — Encounter: Payer: Self-pay | Admitting: Genetic Counselor

## 2013-05-24 VITALS — BP 116/80 | HR 99 | Temp 98.5°F | Resp 18 | Ht 64.0 in | Wt 206.0 lb

## 2013-05-24 DIAGNOSIS — C773 Secondary and unspecified malignant neoplasm of axilla and upper limb lymph nodes: Secondary | ICD-10-CM

## 2013-05-24 DIAGNOSIS — C50912 Malignant neoplasm of unspecified site of left female breast: Secondary | ICD-10-CM

## 2013-05-24 DIAGNOSIS — C50919 Malignant neoplasm of unspecified site of unspecified female breast: Secondary | ICD-10-CM

## 2013-05-24 DIAGNOSIS — E876 Hypokalemia: Secondary | ICD-10-CM

## 2013-05-24 DIAGNOSIS — Z853 Personal history of malignant neoplasm of breast: Secondary | ICD-10-CM

## 2013-05-24 DIAGNOSIS — Z171 Estrogen receptor negative status [ER-]: Secondary | ICD-10-CM

## 2013-05-24 DIAGNOSIS — Z5111 Encounter for antineoplastic chemotherapy: Secondary | ICD-10-CM

## 2013-05-24 DIAGNOSIS — R5381 Other malaise: Secondary | ICD-10-CM

## 2013-05-24 LAB — COMPREHENSIVE METABOLIC PANEL (CC13)
AST: 19 U/L (ref 5–34)
Albumin: 3.6 g/dL (ref 3.5–5.0)
Anion Gap: 13 mEq/L — ABNORMAL HIGH (ref 3–11)
BUN: 9.6 mg/dL (ref 7.0–26.0)
CO2: 28 mEq/L (ref 22–29)
Calcium: 9.5 mg/dL (ref 8.4–10.4)
Chloride: 103 mEq/L (ref 98–109)
Creatinine: 0.7 mg/dL (ref 0.6–1.1)
Glucose: 135 mg/dl (ref 70–140)
Potassium: 3 mEq/L — CL (ref 3.5–5.1)
Sodium: 143 mEq/L (ref 136–145)
Total Protein: 7 g/dL (ref 6.4–8.3)

## 2013-05-24 LAB — CBC WITH DIFFERENTIAL/PLATELET
BASO%: 0.2 % (ref 0.0–2.0)
Basophils Absolute: 0 10*3/uL (ref 0.0–0.1)
Eosinophils Absolute: 0 10*3/uL (ref 0.0–0.5)
HCT: 31.6 % — ABNORMAL LOW (ref 34.8–46.6)
HGB: 10.4 g/dL — ABNORMAL LOW (ref 11.6–15.9)
LYMPH%: 35.8 % (ref 14.0–49.7)
MONO#: 0.7 10*3/uL (ref 0.1–0.9)
NEUT#: 3.1 10*3/uL (ref 1.5–6.5)
NEUT%: 52 % (ref 38.4–76.8)
Platelets: 167 10*3/uL (ref 145–400)
WBC: 5.9 10*3/uL (ref 3.9–10.3)
lymph#: 2.1 10*3/uL (ref 0.9–3.3)

## 2013-05-24 MED ORDER — SODIUM CHLORIDE 0.9 % IV SOLN
Freq: Once | INTRAVENOUS | Status: AC
  Administered 2013-05-24: 15:00:00 via INTRAVENOUS

## 2013-05-24 MED ORDER — SODIUM CHLORIDE 0.9 % IJ SOLN
10.0000 mL | INTRAMUSCULAR | Status: DC | PRN
  Administered 2013-05-24: 10 mL
  Filled 2013-05-24: qty 10

## 2013-05-24 MED ORDER — SODIUM CHLORIDE 0.9 % IV SOLN
800.0000 mg/m2 | Freq: Once | INTRAVENOUS | Status: AC
  Administered 2013-05-24: 1596 mg via INTRAVENOUS
  Filled 2013-05-24: qty 42

## 2013-05-24 MED ORDER — ONDANSETRON 8 MG/50ML IVPB (CHCC)
8.0000 mg | Freq: Once | INTRAVENOUS | Status: AC
  Administered 2013-05-24: 8 mg via INTRAVENOUS

## 2013-05-24 MED ORDER — HEPARIN SOD (PORK) LOCK FLUSH 100 UNIT/ML IV SOLN
500.0000 [IU] | Freq: Once | INTRAVENOUS | Status: AC | PRN
  Administered 2013-05-24: 500 [IU]
  Filled 2013-05-24: qty 5

## 2013-05-24 MED ORDER — DEXAMETHASONE SODIUM PHOSPHATE 10 MG/ML IJ SOLN
INTRAMUSCULAR | Status: AC
  Filled 2013-05-24: qty 1

## 2013-05-24 MED ORDER — ONDANSETRON 8 MG/NS 50 ML IVPB
INTRAVENOUS | Status: AC
  Filled 2013-05-24: qty 8

## 2013-05-24 MED ORDER — DEXAMETHASONE SODIUM PHOSPHATE 10 MG/ML IJ SOLN
10.0000 mg | Freq: Once | INTRAMUSCULAR | Status: AC
  Administered 2013-05-24: 10 mg via INTRAVENOUS

## 2013-05-24 MED ORDER — SODIUM CHLORIDE 0.9 % IV SOLN
300.0000 mg | Freq: Once | INTRAVENOUS | Status: AC
  Administered 2013-05-24: 300 mg via INTRAVENOUS
  Filled 2013-05-24: qty 30

## 2013-05-24 NOTE — Telephone Encounter (Signed)
Per staff message and POF I have scheduled appts.  JMW  

## 2013-05-24 NOTE — Progress Notes (Signed)
Larna Daughters, NP asked to be paged to infusion room when patient arrived so she could assess PAC site. Paged NP, she came and assessed PAC, and received verbal order that [facility] is ok to use for today's treatment. Angelena Form, RN

## 2013-05-24 NOTE — Progress Notes (Signed)
OFFICE PROGRESS NOTE  CC**  Christine Mew, MD [address] 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   PRIOR THERAPY:  1. Patient has history of right breast cancer patient underwent a mastectomy when she was 57 years old. This occurred during her pregnancy. After that patient did not take any kind of further treatments. She continued to do well.   2.Recently she noted changes in her nipple had become itchy and retracted. Because of this she underwent mammogram with ultrasound that showed 2 areas in the left breast one at 12:00 position and a second at 3:00 position. She also had an ultrasound performed that showed the diameter to be at least 6 cm and multiple left axillary lymph nodes that were abnormal. She then went on to have a biopsy performed on 09/30/2012. 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 Patient went on to have MRIs of the breasts performed and was noted to be marked diffuse enhancement involving majority of the left breast measuring 13.0 x 12.5 x 9.5 cm. There was diffuse skin thickening and dermal enhancement. Multiple enlarged level I left axillary lymph nodes. The largest lymph node measured 2.4 cm in size. The large area of enhancement within the left breast extends posteriorly to abut the anterior portion of the pectoralis muscle. There was no evidence of internal mammary adenopathy or right axillary adenopathy.  PET/CT did show the left breast mass, and left axillary and subpectoralis nodal metastases, but no further areas throughout the chest/abd/pelvis of metastases.   3.  Patient currently undergoing  neoadjuvant chemotherapy with FEC x 6 cycles starting on 11/02/12, which will be followed by 12 cycles of weekly Taxol/Carbo that started on 02/08/13.  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  She will begin Gemzar/Carbo on 05/24/13.  She will receive 2 cycles.    CURRENT THERAPY: Gemzar carbo cycle 1 day 1   INTERVAL HISTORY: Christine [name] 57 y.o. female returns for follow up today.  She's doing well today.  She is fatigued, and has occasional sciatic pain that she has a longstanding history of, but is otherwise feeling well.  She saw Dr. Luisa Hart last week for evaluation of her port.  He cleared her to receive chemotherapy through it.  Her numbness is slightly improved, she is taking Gabapentin 100mg  TID, and continues to improve.    MEDICAL HISTORY: Past Medical History  Diagnosis Date  . ALLERGIC RHINITIS   . GERD (gastroesophageal reflux disease)   . Hypertension   . Hypothyroidism   . Hx of colonic polyps   . Wears glasses   . Breast cancer 90, age 79    right  . Breast cancer 09/30/12    left, ER/PR -, Her 2 -    ALLERGIES:  is allergic to sulfa antibiotics and sulfonamide derivatives.  MEDICATIONS:  Current Outpatient Prescriptions  Medication Sig Dispense Refill  . dexamethasone (DECADRON) 4 MG tablet TAKE 2 TABS PO QD ON DAY AFTER CHEMO THEN 2 TABS BID X 2 DAYS  30 tablet  1  . diltiazem (CARDIZEM CD) 240 MG 24 hr capsule TAKE ONE CAPSULE BY MOUTH EVERY  DAY  90 capsule  3  . doxycycline (VIBRA-TABS) 100 MG tablet Take 1 tablet (100 mg total) by mouth 2 (two) times daily.  14 tablet  2  . fluconazole (DIFLUCAN) 200 MG tablet Take 1 tablet (200 mg total) by mouth every other day.  10 tablet  2  . fluticasone (FLONASE) 50 MCG/ACT nasal spray Place 2 sprays into the nose daily.  16 g  2  . gabapentin (NEURONTIN) 300 MG capsule Take 1 capsule (300 mg total) by mouth 3 (three) times daily. 3 month supply  270 capsule  0  .  latanoprost (XALATAN) 0.005 % ophthalmic solution Place 1 drop into both eyes at bedtime.       Marland Kitchen levothyroxine (SYNTHROID, LEVOTHROID) 50 MCG tablet TAKE 1 TABLET BY MOUTH EVERY DAY  90 tablet  2  . lidocaine-prilocaine (EMLA) cream Apply topically as needed.  30 g  6  . LORazepam (ATIVAN) 0.5 MG tablet Take 1 tablet (0.5 mg total) by mouth every 6 (six) hours as needed (Nausea or vomiting).  30 tablet  0  . losartan-hydrochlorothiazide (HYZAAR) 50-12.5 MG per tablet Take 1 tablet by mouth daily.  90 tablet  0  . NEXIUM 40 MG capsule TAKE 1 CAPSULE (40 MG TOTAL) BY MOUTH DAILY BEFORE BREAKFAST.  90 capsule  1  . nystatin (MYCOSTATIN) 100000 UNIT/ML suspension Take 5 mLs (500,000 Units total) by mouth 4 (four) times daily.  240 mL  1  . potassium chloride SA (K-DUR,KLOR-CON) 20 MEQ tablet Take 2 tablets (40 mEq total) by mouth 2 (two) times daily.  120 tablet  0   No current facility-administered medications for this visit.    SURGICAL HISTORY:  Past Surgical History  Procedure Laterality Date  . Tonsillectomy    . Caesarean section      x 1  . Mastectomy  04/1987    right side  . Abdominal hysterectomy  06/1988    fibroids  . Portacath placement Right 10/28/2012    Procedure: PORT PLACEMENT;  Surgeon: Clovis Pu. Cornett, MD;  Location: [facility];  Service: General;  Laterality: Right;  Right Subclavian Vein    REVIEW OF SYSTEMS:  A 10 point review of systems was conducted and is otherwise negative except for what is noted above.     PHYSICAL EXAMINATION: Blood pressure 116/80, pulse 99, temperature 98.5 F (36.9 C), temperature source Oral, resp. rate 18, height 5\' 4"  (1.626 m), weight 206 lb (93.441 kg). Body mass index is 35.34 kg/(m^2). General: Patient is a well appearing female in no acute distress HEENT: PERRLA, sclerae anicteric no conjunctival pallor, MMM. Neck: supple, no palpable adenopathy Lungs: clear to auscultation bilaterally, no wheezes, rhonchi, or  rales Cardiovascular: regular rate rhythm, S1, S2, no murmurs, rubs or gallops Abdomen: Soft, non-tender, non-distended, normoactive bowel sounds, no HSM Extremities: warm and well perfused, no clubbing, cyanosis, or edema Skin: No rashes or lesions, right chest wall port, covered with cream, not examined Neuro: Non-focal ECOG PERFORMANCE STATUS: 1 - Symptomatic but completely ambulatory  LABORATORY DATA: Lab Results  Component Value Date   WBC 5.9 05/24/2013   HGB 10.4* 05/24/2013   HCT 31.6* 05/24/2013   MCV 94.6 05/24/2013   PLT 167 05/24/2013      Chemistry      Component Value Date/Time   NA 143 05/24/2013 1243   NA 144 10/28/2012 1248   K 3.0* 05/24/2013 1243   K 3.9 10/28/2012 1248   CL 101 01/25/2013 4098  CL 108 10/28/2012 1248   CO2 28 05/24/2013 1243   CO2 29 09/13/2011 1910   BUN 9.6 05/24/2013 1243   BUN 10 10/28/2012 1248   CREATININE 0.7 05/24/2013 1243   CREATININE 0.80 10/28/2012 1248      Component Value Date/Time   CALCIUM 9.5 05/24/2013 1243   CALCIUM 9.4 09/13/2011 1910   ALKPHOS 90 05/24/2013 1243   ALKPHOS 78 09/13/2011 1910   AST 19 05/24/2013 1243   AST 18 09/13/2011 1910   ALT 10 05/24/2013 1243   ALT 12 09/13/2011 1910   BILITOT 0.43 05/24/2013 1243   BILITOT 0.3 09/13/2011 1910       RADIOGRAPHIC STUDIES:  Ct Chest W Contrast  10/27/2012  *RADIOLOGY REPORT*  Clinical Data:  High risk breast cancer staging.  Right breast cancer diagnosed 1988.  Left breast cancer diagnosed February 2014.  CT CHEST, ABDOMEN AND PELVIS WITH CONTRAST  Technique:  Multidetector CT imaging of the chest, abdomen and pelvis was performed following the standard protocol during bolus administration of intravenous contrast.  Contrast: OMNIPAQUE IOHEXOL 300 MG/ML  SOLN  Comparison:  PET CT scan 03/26 1014  CT CHEST  Findings:  Right breast mastectomy anatomy.  There is thickening of the skin of the left breast to 11 mm.  There is a mass within the deep posterior left breast  measuring 29 x 30 mm (image 6).  There are enlarged round abnormal lymph nodes in the left axilla measuring up to 20 mm short axis (image 22).  Lymph nodes extend beneath the sub pectoralis muscles on the left (image 12).  There is no evidence of internal mammary lymphadenopathy.  No infraclavicular adenopathy.  No mediastinal adenopathy.  No pericardial fluid.  No suspicious pulmonary nodules.  IMPRESSION:  1.  Left breast mass consistent with breast carcinoma. 2.  Skin thickening over the left breast is concerning for inflammatory breast cancer. 3.  Nodal metastasis in the left axilla and sub pectoralis nodal stations. 4.  No evidence of central nodal metastasis or mediastinal metastasis  CT ABDOMEN AND PELVIS  Findings:  No focal hepatic lesion.  The gallbladder, pancreas, spleen, adrenal gland is, and kidneys are normal.  The stomach, small bowel, appendix, cecum are normal.  The colon and rectosigmoid colon are normal.  Abdominal aorta is normal caliber.  No retroperitoneal or periportal lymphadenopathy.  No mesenteric or peritoneal disease.  No free fluid in  the pelvis.  Post hysterectomy anatomy.  No pelvic lymphadenopathy. Review of  bone windows demonstrates no aggressive osseous lesions.  IMPRESSION:  No evidence of metastasis in the abdomen or pelvis.   Original Report Authenticated By: Genevive Bi, M.D.    Ct Abdomen Pelvis W Contrast  10/27/2012  *RADIOLOGY REPORT*  Clinical Data:  High risk breast cancer staging.  Right breast cancer diagnosed 1988.  Left breast cancer diagnosed February 2014.  CT CHEST, ABDOMEN AND PELVIS WITH CONTRAST  Technique:  Multidetector CT imaging of the chest, abdomen and pelvis was performed following the standard protocol during bolus administration of intravenous contrast.  Contrast: OMNIPAQUE IOHEXOL 300 MG/ML  SOLN  Comparison:  PET CT scan 03/26 1014  CT CHEST  Findings:  Right breast mastectomy anatomy.  There is thickening of the skin of the left breast  to 11 mm.  There is a mass within the deep posterior left breast measuring 29 x 30 mm (image 6).  There are enlarged round abnormal lymph nodes in the left axilla measuring up to 20  mm short axis (image 22).  Lymph nodes extend beneath the sub pectoralis muscles on the left (image 12).  There is no evidence of internal mammary lymphadenopathy.  No infraclavicular adenopathy.  No mediastinal adenopathy.  No pericardial fluid.  No suspicious pulmonary nodules.  IMPRESSION:  1.  Left breast mass consistent with breast carcinoma. 2.  Skin thickening over the left breast is concerning for inflammatory breast cancer. 3.  Nodal metastasis in the left axilla and sub pectoralis nodal stations. 4.  No evidence of central nodal metastasis or mediastinal metastasis  CT ABDOMEN AND PELVIS  Findings:  No focal hepatic lesion.  The gallbladder, pancreas, spleen, adrenal gland is, and kidneys are normal.  The stomach, small bowel, appendix, cecum are normal.  The colon and rectosigmoid colon are normal.  Abdominal aorta is normal caliber.  No retroperitoneal or periportal lymphadenopathy.  No mesenteric or peritoneal disease.  No free fluid in  the pelvis.  Post hysterectomy anatomy.  No pelvic lymphadenopathy. Review of  bone windows demonstrates no aggressive osseous lesions.  IMPRESSION:  No evidence of metastasis in the abdomen or pelvis.   Original Report Authenticated By: Genevive Bi, M.D.    Mr Breast Bilateral W Wo Contrast  10/12/2012  *RADIOLOGY REPORT*  Clinical Data: History of previous malignant mastectomy of the right breast 25 years ago.  The patient developed diffuse skin thickening and firmness within the left breast.  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  BUN and creatinine were obtained on site at [facility] at [address] Results:  BUN 7 mg/dL,  Creatinine 1.0  mg/dL.  BILATERAL BREAST MRI WITH AND WITHOUT CONTRAST  Technique: Multiplanar, multisequence MR images of both breasts were obtained prior to and following the intravenous administration of 20ml of Multihance.  Three dimensional images were evaluated at the independent DynaCad workstation.  Comparison:  Mammograms dated 09/30/2012 and 09/28/2012.  Findings: There has been a previous right mastectomy.  There is marked diffuse enhancement involving the majority of the left breast.  This measures 13.0 x 12.5 x 9.5 cm in size.  This is associated with a mixture of plateau and washout enhancement kinetics.  In addition,  there is diffuse skin thickening and dermal enhancement.  There are multiple enlarged level I left axillary lymph nodes.  The largest lymph node measures 2.4 cm in size.  The large area of enhancement within the left breast extends posteriorly to abut the anterior portion of the pectoral muscle. There is no evidence for internal mammary adenopathy or right axillary adenopathy.  There are no additional findings.  IMPRESSION:  1.  Large ( 13 cm) area of enhancement involving the majority of the left breast consistent with the patient's known invasive ductal carcinoma and DCIS. This extends posteriorly to abut the pectoralis muscle. 2.  Multiple enlarged level I left axillary lymph nodes. 3.  Previous right mastectomy.  RECOMMENDATION: Treatment plan  THREE-DIMENSIONAL MR IMAGE RENDERING ON INDEPENDENT WORKSTATION:  Three-dimensional MR images were rendered by post-processing of the original MR data on an independent workstation.  The three- dimensional MR images were interpreted, and findings were reported in the accompanying complete MRI report for this study.  BI-RADS CATEGORY 6:  Known biopsy-proven malignancy - appropriate action should be taken.   Original Report Authenticated By: Rolla Plate, M.D.    Nm Pet Image Initial (pi) Skull Base To Thigh  10/27/2012  *  RADIOLOGY REPORT*  Clinical  Data: Initial treatment strategy for high risk of breast cancer. Triple negative  NUCLEAR MEDICINE PET SKULL BASE TO THIGH  Fasting Blood Glucose:  97  Technique:  17.9 mCi F-18 FDG was injected intravenously. CT data was obtained and used for attenuation correction and anatomic localization only.  (This was not acquired as a diagnostic CT examination.) Additional exam technical data entered on technologist worksheet.  Comparison:  Findings:  Neck: No hypermetabolic lymph nodes in the neck.  Chest:  There is intense metabolic activity ( SUV max = 78.2) associated with a large portion of the glandular tissue of the left breast consistent with carcinoma.  There are intensely hypermetabolic left axillary lymph nodes with SUV max = 15.1.  These nodes are rounded and measure up to 2 cm. Hypermetabolic nodes extend to the sub pectoralis nodal station (image 71).  There is a single hypermetabolic internal mammary lymph node measuring 5 mm just left of the sternum (image 87).  This has intense metabolic activity for size with SUV max = 7.1.  No hypermetabolic mediastinal lymph nodes.  No hypermetabolic pulmonary nodules.  Abdomen/Pelvis:  No abnormal hypermetabolic activity within the liver, pancreas, adrenal glands, or spleen.  No hypermetabolic lymph nodes in the abdomen or pelvis.  Skeleton:  No focal hypermetabolic activity to suggest skeletal metastasis.  IMPRESSION:  1.  Hypermetabolic breast tissue on the left consistent with primary carcinoma. 2.  Hypermetabolic nodal metastasis in the left axilla extending to the sub pectoralis nodes. 3.  Single hypermetabolic left internal mammary lymph node.  4.  No evidence of pulmonary metastasis.  5.  No evidence of metastasis beneath hemidiaphragms or within the skeleton.   Original Report Authenticated By: Genevive Bi, M.D.    Dg Chest Port 1 View  10/28/2012  *RADIOLOGY REPORT*  Clinical Data: Port-A-Cath placement.  PORTABLE CHEST - 1 VIEW  Comparison: None.   Findings: The patient is rotated on the study.  Right side Port-A- Cath is in place.  Tubing appears intact.  Tip of the catheter projects in the mid to lower superior vena cava.  No pneumothorax identified.  Heart size is normal.  Lungs are clear.  The patient is status post right mastectomy and axillary dissection.  IMPRESSION: Tip of Port-A-Cath projects over the mid to lower superior vena cava.  The patient is rotated on the study.  Exact position of the tip of the catheter could be determined with the lateral film. No pneumothorax.   Original Report Authenticated By: Holley Dexter, M.D.    Dg Fluoro Guide Cv Line-no Report  10/28/2012  CLINICAL DATA: [facility]   FLOURO GUIDE CV LINE  Fluoroscopy was utilized by the requesting physician.  No radiographic  interpretation.      ASSESSMENT:   57 year old female with  #1 history of breast cancer of the right breast status post mastectomy when she was 57 years old. Due to this she was referred for genetic testing.   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 Patient's tumor is ER negative PR negative HER-2/neu negative with a Ki-67 of 21%. Patient is being seen in medical oncology for neoadjuvant chemotherapy. Patient's pathology was discussed with her in detail, as well as treatment for breast cancer including surgical and with chemotherapy. Patient understands that she may still need a mastectomy however by giving her neoadjuvant chemotherapy her surgery may be made  a little bit easier hopefully with giving her negative margins. Patient started dense FEC x6 cycles followed by weekly Taxol carboplatinum for 12 weeks.  We discontinued the Taxol carbo after 8 weeks due to progressive neuropathy.  She will start Gemzar carbo hopefully on 05/24/13.     #3 Patient underwent staging studies with PET/CT, and received echo, port placement and chemotherapy  class.    #4 Hypokalemia--she has not been taking her potassium as prescribed.  She will take PO BID and we will re-evaluate next week.    PLAN:   1.Doing well.  Patient's labs are stable. She will proceed with chemotherapy today.    2. Her MRI results were good.  Her tumor has decreased to 2 spiculated masses in her left breast, one 1.2 cm the other 1.3 cm.  This is excellent compared to the 13cm tumor prior to starting chemotherapy.    3.  She will return tomorrow for Neulasta and in one week for labs and an appointment.    All questions were answered. The patient knows to call the clinic with any problems, questions or concerns. We can certainly see the patient much sooner if necessary.  I spent 25 minutes counseling the patient face to face. The total time spent in the appointment was 30 minutes.  Illa Level, NP Medical Oncology [facility] [phone] 05/25/2013, 9:04 AM

## 2013-05-24 NOTE — Patient Instructions (Signed)
Gemcitabine (Gemzar)  injection What is this medicine? GEMCITABINE (jem SIT a been) is a chemotherapy drug. This medicine is used to treat many types of cancer like breast cancer, lung cancer, pancreatic cancer, and ovarian cancer. This medicine may be used for other purposes; ask your health care provider or pharmacist if you have questions. What should I tell my health care provider before I take this medicine? They need to know if you have any of these conditions: -blood disorders -infection -kidney disease -liver disease -recent or ongoing radiation therapy -an unusual or allergic reaction to gemcitabine, other chemotherapy, other medicines, foods, dyes, or preservatives -pregnant or trying to get pregnant -breast-feeding How should I use this medicine? This drug is given as an infusion into a vein. It is administered in a hospital or clinic by a specially trained health care professional. Talk to your pediatrician regarding the use of this medicine in children. Special care may be needed. Overdosage: If you think you have taken too much of this medicine contact a poison control center or emergency room at once. NOTE: This medicine is only for you. Do not share this medicine with others. What if I miss a dose? It is important not to miss your dose. Call your doctor or health care professional if you are unable to keep an appointment. What may interact with this medicine? -medicines to increase blood counts like filgrastim, pegfilgrastim, sargramostim -some other chemotherapy drugs like cisplatin -vaccines Talk to your doctor or health care professional before taking any of these medicines: -acetaminophen -aspirin -ibuprofen -ketoprofen -naproxen This list may not describe all possible interactions. Give your health care provider a list of all the medicines, herbs, non-prescription drugs, or dietary supplements you use. Also tell them if you smoke, drink alcohol, or use illegal  drugs. Some items may interact with your medicine. What should I watch for while using this medicine? Visit your doctor for checks on your progress. This drug may make you feel generally unwell. This is not uncommon, as chemotherapy can affect healthy cells as well as cancer cells. Report any side effects. Continue your course of treatment even though you feel ill unless your doctor tells you to stop. In some cases, you may be given additional medicines to help with side effects. Follow all directions for their use. Call your doctor or health care professional for advice if you get a fever, chills or sore throat, or other symptoms of a cold or flu. Do not treat yourself. This drug decreases your body's ability to fight infections. Try to avoid being around people who are sick. This medicine may increase your risk to bruise or bleed. Call your doctor or health care professional if you notice any unusual bleeding. Be careful brushing and flossing your teeth or using a toothpick because you may get an infection or bleed more easily. If you have any dental work done, tell your dentist you are receiving this medicine. Avoid taking products that contain aspirin, acetaminophen, ibuprofen, naproxen, or ketoprofen unless instructed by your doctor. These medicines may hide a fever. Women should inform their doctor if they wish to become pregnant or think they might be pregnant. There is a potential for serious side effects to an unborn child. Talk to your health care professional or pharmacist for more information. Do not breast-feed an infant while taking this medicine. What side effects may I notice from receiving this medicine? Side effects that you should report to your doctor or health care professional as soon as  possible: -allergic reactions like skin rash, itching or hives, swelling of the face, lips, or tongue -low blood counts - this medicine may decrease the number of white blood cells, red blood cells  and platelets. You may be at increased risk for infections and bleeding. -signs of infection - fever or chills, cough, sore throat, pain or difficulty passing urine -signs of decreased platelets or bleeding - bruising, pinpoint red spots on the skin, black, tarry stools, blood in the urine -signs of decreased red blood cells - unusually weak or tired, fainting spells, lightheadedness -breathing problems -chest pain -mouth sores -nausea and vomiting -pain, swelling, redness at site where injected -pain, tingling, numbness in the hands or feet -stomach pain -swelling of ankles, feet, hands -unusual bleeding Side effects that usually do not require medical attention (report to your doctor or health care professional if they continue or are bothersome): -constipation -diarrhea -hair loss -loss of appetite -stomach upset This list may not describe all possible side effects. Call your doctor for medical advice about side effects. You may report side effects to FDA at 1-800-FDA-1088. Where should I keep my medicine? This drug is given in a hospital or clinic and will not be stored at home. NOTE: This sheet is a summary. It may not cover all possible information. If you have questions about this medicine, talk to your doctor, pharmacist, or health care provider.  2013, Elsevier/Gold Standard. (11/30/2007 6:45:54 PM)  Carboplatin injection What is this medicine? CARBOPLATIN (KAR boe pla tin) is a chemotherapy drug. It targets fast dividing cells, like cancer cells, and causes these cells to die. This medicine is used to treat ovarian cancer and many other cancers. This medicine may be used for other purposes; ask your health care provider or pharmacist if you have questions. What should I tell my health care provider before I take this medicine? They need to know if you have any of these conditions: -blood disorders -hearing problems -kidney disease -recent or ongoing radiation therapy -an  unusual or allergic reaction to carboplatin, cisplatin, other chemotherapy, other medicines, foods, dyes, or preservatives -pregnant or trying to get pregnant -breast-feeding How should I use this medicine? This drug is usually given as an infusion into a vein. It is administered in a hospital or clinic by a specially trained health care professional. Talk to your pediatrician regarding the use of this medicine in children. Special care may be needed. Overdosage: If you think you have taken too much of this medicine contact a poison control center or emergency room at once. NOTE: This medicine is only for you. Do not share this medicine with others. What if I miss a dose? It is important not to miss a dose. Call your doctor or health care professional if you are unable to keep an appointment. What may interact with this medicine? -medicines for seizures -medicines to increase blood counts like filgrastim, pegfilgrastim, sargramostim -some antibiotics like amikacin, gentamicin, neomycin, streptomycin, tobramycin -vaccines Talk to your doctor or health care professional before taking any of these medicines: -acetaminophen -aspirin -ibuprofen -ketoprofen -naproxen This list may not describe all possible interactions. Give your health care provider a list of all the medicines, herbs, non-prescription drugs, or dietary supplements you use. Also tell them if you smoke, drink alcohol, or use illegal drugs. Some items may interact with your medicine. What should I watch for while using this medicine? Your condition will be monitored carefully while you are receiving this medicine. You will need important blood work done  while you are taking this medicine. This drug may make you feel generally unwell. This is not uncommon, as chemotherapy can affect healthy cells as well as cancer cells. Report any side effects. Continue your course of treatment even though you feel ill unless your doctor tells you to  stop. In some cases, you may be given additional medicines to help with side effects. Follow all directions for their use. Call your doctor or health care professional for advice if you get a fever, chills or sore throat, or other symptoms of a cold or flu. Do not treat yourself. This drug decreases your body's ability to fight infections. Try to avoid being around people who are sick. This medicine may increase your risk to bruise or bleed. Call your doctor or health care professional if you notice any unusual bleeding. Be careful brushing and flossing your teeth or using a toothpick because you may get an infection or bleed more easily. If you have any dental work done, tell your dentist you are receiving this medicine. Avoid taking products that contain aspirin, acetaminophen, ibuprofen, naproxen, or ketoprofen unless instructed by your doctor. These medicines may hide a fever. Do not become pregnant while taking this medicine. Women should inform their doctor if they wish to become pregnant or think they might be pregnant. There is a potential for serious side effects to an unborn child. Talk to your health care professional or pharmacist for more information. Do not breast-feed an infant while taking this medicine. What side effects may I notice from receiving this medicine? Side effects that you should report to your doctor or health care professional as soon as possible: -allergic reactions like skin rash, itching or hives, swelling of the face, lips, or tongue -signs of infection - fever or chills, cough, sore throat, pain or difficulty passing urine -signs of decreased platelets or bleeding - bruising, pinpoint red spots on the skin, black, tarry stools, nosebleeds -signs of decreased red blood cells - unusually weak or tired, fainting spells, lightheadedness -breathing problems -changes in hearing -changes in vision -chest pain -high blood pressure -low blood counts - This drug may  decrease the number of white blood cells, red blood cells and platelets. You may be at increased risk for infections and bleeding. -nausea and vomiting -pain, swelling, redness or irritation at the injection site -pain, tingling, numbness in the hands or feet -problems with balance, talking, walking -trouble passing urine or change in the amount of urine Side effects that usually do not require medical attention (report to your doctor or health care professional if they continue or are bothersome): -hair loss -loss of appetite -metallic taste in the mouth or changes in taste This list may not describe all possible side effects. Call your doctor for medical advice about side effects. You may report side effects to FDA at 1-800-FDA-1088. Where should I keep my medicine? This drug is given in a hospital or clinic and will not be stored at home. NOTE: This sheet is a summary. It may not cover all possible information. If you have questions about this medicine, talk to your doctor, pharmacist, or health care provider.  2012, Elsevier/Gold Standard. (10/26/2007 2:38:05 PM)

## 2013-05-25 ENCOUNTER — Ambulatory Visit (HOSPITAL_BASED_OUTPATIENT_CLINIC_OR_DEPARTMENT_OTHER): Payer: BC Managed Care – PPO

## 2013-05-25 ENCOUNTER — Telehealth: Payer: Self-pay | Admitting: Genetic Counselor

## 2013-05-25 VITALS — BP 156/82 | HR 109 | Temp 98.5°F

## 2013-05-25 DIAGNOSIS — C50919 Malignant neoplasm of unspecified site of unspecified female breast: Secondary | ICD-10-CM

## 2013-05-25 DIAGNOSIS — C50912 Malignant neoplasm of unspecified site of left female breast: Secondary | ICD-10-CM

## 2013-05-25 DIAGNOSIS — Z5189 Encounter for other specified aftercare: Secondary | ICD-10-CM

## 2013-05-25 DIAGNOSIS — C773 Secondary and unspecified malignant neoplasm of axilla and upper limb lymph nodes: Secondary | ICD-10-CM

## 2013-05-25 MED ORDER — PEGFILGRASTIM INJECTION 6 MG/0.6ML
6.0000 mg | Freq: Once | SUBCUTANEOUS | Status: AC
  Administered 2013-05-25: 6 mg via SUBCUTANEOUS
  Filled 2013-05-25: qty 0.6

## 2013-05-25 NOTE — Telephone Encounter (Signed)
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  I will send a copy of her report.  She still has two other variants that are considered VUS.

## 2013-05-31 ENCOUNTER — Ambulatory Visit (HOSPITAL_BASED_OUTPATIENT_CLINIC_OR_DEPARTMENT_OTHER): Payer: BC Managed Care – PPO | Admitting: Family

## 2013-05-31 ENCOUNTER — Other Ambulatory Visit (HOSPITAL_BASED_OUTPATIENT_CLINIC_OR_DEPARTMENT_OTHER): Payer: BC Managed Care – PPO | Admitting: Lab

## 2013-05-31 ENCOUNTER — Encounter: Payer: Self-pay | Admitting: Family

## 2013-05-31 VITALS — BP 104/71 | HR 108 | Temp 98.4°F | Resp 18 | Ht 64.0 in | Wt 201.0 lb

## 2013-05-31 DIAGNOSIS — Z171 Estrogen receptor negative status [ER-]: Secondary | ICD-10-CM

## 2013-05-31 DIAGNOSIS — R5381 Other malaise: Secondary | ICD-10-CM

## 2013-05-31 DIAGNOSIS — C50912 Malignant neoplasm of unspecified site of left female breast: Secondary | ICD-10-CM

## 2013-05-31 DIAGNOSIS — C50919 Malignant neoplasm of unspecified site of unspecified female breast: Secondary | ICD-10-CM

## 2013-05-31 DIAGNOSIS — C773 Secondary and unspecified malignant neoplasm of axilla and upper limb lymph nodes: Secondary | ICD-10-CM

## 2013-05-31 DIAGNOSIS — Z853 Personal history of malignant neoplasm of breast: Secondary | ICD-10-CM

## 2013-05-31 LAB — COMPREHENSIVE METABOLIC PANEL (CC13)
Albumin: 3.8 g/dL (ref 3.5–5.0)
Alkaline Phosphatase: 125 U/L (ref 40–150)
BUN: 17.7 mg/dL (ref 7.0–26.0)
Glucose: 238 mg/dl — ABNORMAL HIGH (ref 70–140)
Potassium: 3.7 mEq/L (ref 3.5–5.1)
Total Bilirubin: 0.36 mg/dL (ref 0.20–1.20)

## 2013-05-31 LAB — CBC WITH DIFFERENTIAL/PLATELET
Basophils Absolute: 0 10*3/uL (ref 0.0–0.1)
Eosinophils Absolute: 0 10*3/uL (ref 0.0–0.5)
HGB: 12.5 g/dL (ref 11.6–15.9)
LYMPH%: 23.1 % (ref 14.0–49.7)
MCH: 31.6 pg (ref 25.1–34.0)
MCV: 92.4 fL (ref 79.5–101.0)
MONO#: 0.7 10*3/uL (ref 0.1–0.9)
MONO%: 9 % (ref 0.0–14.0)
NEUT#: 5.5 10*3/uL (ref 1.5–6.5)
NEUT%: 67.3 % (ref 38.4–76.8)
Platelets: 113 10*3/uL — ABNORMAL LOW (ref 145–400)
RBC: 3.95 10*6/uL (ref 3.70–5.45)
RDW: 16.2 % — ABNORMAL HIGH (ref 11.2–14.5)

## 2013-05-31 NOTE — Progress Notes (Addendum)
[facility]  Telephone:([phone] Fax:([phone]) [phone]  OFFICE PROGRESS NOTE  ID: [name]   DOB: September 07, 1955  MR#: [id number]  AOZ#:[id number]   PCP: [name], MD SU:  [name], M.D.   DIAGNOSIS: [name] is a 57 y.o.  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   PRIOR THERAPY: 1. Patient has history of right breast cancer patient underwent a mastectomy when she was 57 years old. This occurred during her pregnancy. After that patient did not take any kind of further treatments. She continued to do well.   2. She recently noticed her left nipple had become itchy and retracted.  She underwent mammogram with ultrasound that showed 2 areas of concern in the left breast (at the 12 o'clock position and 3 o'clock position).  An ultrasound performed that showed the diameter to be at least 6 cm and multiple left axillary lymph nodes that were abnormal. A left breast needle core biopsy performed on 09/30/2012 at the 2 o'clock position showed invasive ductal carcinoma grade 3, estrogen receptor negative, progesterone receptor negative,proliferation marker Ki-67 21%,  HER-2/neu negative.  Left breast needle core biopsy at the 3 o'clock position showed grade 3 ductal carcinoma in situ.  3.  Bilateral breast MRI on 10/12/2012 showed a previous right breast mastectomy and marked diffuse enhancement involving majority of the left breast measuring 13.0 x 12.5 x 9.5 cm. There was diffuse skin thickening and dermal enhancement. Multiple enlarged level I left axillary lymph nodes. The largest lymph node measured 2.4 cm in size. The large area of enhancement within the left breast extends posteriorly to abut the anterior portion of the pectoralis muscle. There was no evidence of internal  mammary adenopathy or right axillary adenopathy.   4. PET/CT did show the left breast mass, and left axillary and subpectoralis nodal metastases, but no further areas throughout the chest/abd/pelvis of metastases.   5.  Patient received 6 cycles of neoadjuvant chemotherapy consisting of FEC (5-FU/epirubicin/Cytoxan) from 11/02/2012 through 01/25/2013 with Neulasta support.  This was followed by 8 cycles of weekly Taxol/Carbo that started from 02/08/2013 through 04/19/2013.  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  She began neoadjuvant chemotherapy consisting of Gemzar/Carboplatin on 05/24/2013 with day 2 Neulasta support.   She will receive 2 cycles of this neoadjuvant chemotherapy prior to definitive surgery.  CURRENT THERAPY: Gemcitabine/carboplatin for 2 cycles -  first cycle completed on 05/24/2013.    INTERVAL HISTORY: Christine [name] is a  57 y.o. female who returns for follow up today.  She is accompanied by her sister Octavio Graves for today's office visit.  She received cycle 1 of 2 of gemcitabine/carboplatin on 05/24/2013.  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  She continues to be fatigued, and states she experienced a few loose stools and minor dizziness after her last chemotherapy cycle.  Her interval history is otherwise stable.   MEDICAL HISTORY: Past Medical History  Diagnosis Date  . ALLERGIC RHINITIS   . GERD (gastroesophageal reflux disease)   . Hypertension   . Hypothyroidism   . Hx of colonic polyps   . Wears glasses   . Breast cancer 5, age  30    right  . Breast cancer 09/30/12    left, ER/PR -, Her 2 -    ALLERGIES:   Allergies  Allergen Reactions  . Sulfa Antibiotics Hives, Itching and Swelling  . Sulfonamide Derivatives Hives, Itching and Swelling     MEDICATIONS:  Current  Outpatient Prescriptions  Medication Sig Dispense Refill  . dexamethasone (DECADRON) 4 MG tablet TAKE 2 TABS PO QD ON DAY AFTER CHEMO THEN 2 TABS BID X 2 DAYS  30 tablet  1  . diltiazem (CARDIZEM CD) 240 MG 24 hr capsule TAKE ONE CAPSULE BY MOUTH EVERY DAY  90 capsule  3  . fluticasone (FLONASE) 50 MCG/ACT nasal spray Place 2 sprays into the nose daily.  16 g  2  . gabapentin (NEURONTIN) 300 MG capsule Take 1 capsule (300 mg total) by mouth 3 (three) times daily. 3 month supply  270 capsule  0  . latanoprost (XALATAN) 0.005 % ophthalmic solution Place 1 drop into both eyes at bedtime.       Marland Kitchen levothyroxine (SYNTHROID, LEVOTHROID) 50 MCG tablet TAKE 1 TABLET BY MOUTH EVERY DAY  90 tablet  2  . lidocaine-prilocaine (EMLA) cream Apply topically as needed.  30 g  6  . LORazepam (ATIVAN) 0.5 MG tablet Take 1 tablet (0.5 mg total) by mouth every 6 (six) hours as needed (Nausea or vomiting).  30 tablet  0  . losartan-hydrochlorothiazide (HYZAAR) 50-12.5 MG per tablet Take 1 tablet by mouth daily.  90 tablet  0  . NEXIUM 40 MG capsule TAKE 1 CAPSULE (40 MG TOTAL) BY MOUTH DAILY BEFORE BREAKFAST.  90 capsule  1  . potassium chloride SA (K-DUR,KLOR-CON) 20 MEQ tablet Take 2 tablets (40 mEq total) by mouth 2 (two) times daily.  120 tablet  0  . doxycycline (VIBRA-TABS) 100 MG tablet Take 1 tablet (100 mg total) by mouth 2 (two) times daily.  14 tablet  2  . fluconazole (DIFLUCAN) 200 MG tablet Take 1 tablet (200 mg total) by mouth every other day.  10 tablet  2  . nystatin (MYCOSTATIN) 100000 UNIT/ML suspension Take 5 mLs (500,000 Units total) by mouth 4 (four) times daily.  240 mL  1   No current facility-administered medications for this visit.    SURGICAL HISTORY:  Past Surgical History  Procedure Laterality Date  . Tonsillectomy    . Caesarean section      x 1  . Mastectomy  04/1987    right side  . Abdominal hysterectomy  06/1988    fibroids  . Portacath placement Right 10/28/2012     Procedure: PORT PLACEMENT;  Surgeon: Clovis Pu. Cornett, MD;  Location: [facility];  Service: General;  Laterality: Right;  Right Subclavian Vein    REVIEW OF SYSTEMS:  A 10 point review of systems was completed and is negative except as noted above.  Ms. Herber denies any other symptomatology including  fever or chills, headache, vision changes, swollen glands, cough or shortness of breath, chest pain or discomfort, nausea, vomiting, diarrhea, constipation, change in urinary or bowel habits, any other arthralgias/myalgias, unusual bleeding/bruising or any other symptomatology.    PHYSICAL EXAMINATION: Blood pressure 104/71, pulse 108, temperature 98.4 F (36.9 C), temperature source Oral, resp. rate 18, height 5\' 4"  (1.626 m), weight 201 lb (91.173 kg), SpO2 100.00%. Body mass index is 34.48 kg/(m^2).  ECOG PERFORMANCE STATUS: 1 - Symptomatic but completely ambulatory  General appearance: Alert, cooperative, well nourished, no apparent distress Head: Normocephalic,  without obvious abnormality, atraumatic, the patient is wearing a wig Eyes: Conjunctivae/corneas clear, PERRLA, EOMI Nose: Nares, septum and mucosa are normal, no drainage or sinus tenderness Neck: Supple, symmetrical, trachea midline, no tenderness Resp: Clear to auscultation bilaterally, no wheezes/rales/rhonchi Cardio: Regular rate and rhythm, S1, S2 normal, no murmur, click, rub or gallop, no edema, right Port-A-Cath without signs of infection Breasts:  Deferred GI: Soft, distended, non-tender, hypoactive bowel sounds, excessive habitus Skin: No rashes/lesions, skin warm and dry, no erythematous areas, no cyanosis  M/S:  Atraumatic, normal strength in all extremities, normal range of motion, no clubbing  Neurologic: Grossly normal, cranial nerves II through XII intact, alert and oriented x 3 Psych: Appropriate affect   LABORATORY DATA: Lab Results  Component Value Date   WBC 8.1 05/31/2013   HGB 12.5  05/31/2013   HCT 36.5 05/31/2013   MCV 92.4 05/31/2013   PLT 113* 05/31/2013      Chemistry      Component Value Date/Time   NA 137 05/31/2013 1328   NA 144 10/28/2012 1248   K 3.7 05/31/2013 1328   K 3.9 10/28/2012 1248   CL 101 01/25/2013 0952   CL 108 10/28/2012 1248   CO2 23 05/31/2013 1328   CO2 29 09/13/2011 1910   BUN 17.7 05/31/2013 1328   BUN 10 10/28/2012 1248   CREATININE 1.1 05/31/2013 1328   CREATININE 0.80 10/28/2012 1248      Component Value Date/Time   CALCIUM 10.0 05/31/2013 1328   CALCIUM 9.4 09/13/2011 1910   ALKPHOS 125 05/31/2013 1328   ALKPHOS 78 09/13/2011 1910   AST 17 05/31/2013 1328   AST 18 09/13/2011 1910   ALT 14 05/31/2013 1328   ALT 12 09/13/2011 1910   BILITOT 0.36 05/31/2013 1328   BILITOT 0.3 09/13/2011 1910       RADIOGRAPHIC STUDIES: No results found.    ASSESSMENT:   Christine [name] is a 57 y.o. female with:   #1 History of breast cancer of the right breast status post mastectomy when she was 57 years old. Due to this she was referred for genetic testing.   #2 She recently noticed her left nipple had become itchy and retracted.  She underwent mammogram with ultrasound that showed 2 areas of concern in the left breast (at the 12 o'clock position and 3 o'clock position).  An ultrasound performed that showed the diameter to be at least 6 cm and multiple left axillary lymph nodes that were abnormal. A left breast needle core biopsy performed on 09/30/2012 at the 2 o'clock position showed invasive ductal carcinoma grade 3, estrogen receptor negative, progesterone receptor negative,proliferation marker Ki-67 21%,  HER-2/neu negative.  Left breast needle core biopsy at the 3 o'clock position showed grade 3 ductal carcinoma in situ.  #3  Bilateral breast MRI on 10/12/2012 showed a previous right breast mastectomy and marked diffuse enhancement involving majority of the left breast measuring 13.0 x 12.5 x 9.5 cm. There was diffuse skin thickening and dermal  enhancement. Multiple enlarged level I left axillary lymph nodes. The largest lymph node measured 2.4 cm in size. The large area of enhancement within the left breast extends posteriorly to abut the anterior portion of the pectoralis muscle. There was no evidence of internal mammary adenopathy or right axillary adenopathy.   #4. PET/CT did show the left breast mass, and left axillary and subpectoralis nodal metastases, but no further areas throughout the chest/abd/pelvis of metastases.  She then went on to receive a  2-D echocardiogram, Port-A-Cath placement, and attended chemotherapy class.   #5   Patient received 6 cycles of neoadjuvant chemotherapy consisting of FEC (5-FU/epirubicin/Cytoxan) from 11/02/2012 through 01/25/2013 with Neulasta support.  This was followed by 8 cycles of weekly Taxol/Carbo that started from 02/08/2013 through 04/19/2013.  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  She began neoadjuvant chemotherapy consisting of Gemzar/Carboplatin on 05/24/2013 with day 2 Neulasta support.   She will receive 2 cycles of this neoadjuvant chemotherapy prior to definitive surgery.  Left breast mastectomy has been discussed with Dr. Luisa Hart.  #6  Hypokalemia--resolved   PLAN:  #1 Ms. Bierly is scheduled to return on 06/07/2013 to receive cycle #2 of 2 of neoadjuvant chemotherapy consisting of Gemzar/Carboplatin prior to her definitive surgery.  We will check laboratories of CBC and CMP at that time.  Neulasta injection scheduled for 06/08/2013.  She is scheduled to followup with Dr. Luisa Hart on 06/24/2013.  All questions answered.  Ms. Fleeger and her sister Octavio Graves were encouraged to contact us in the interim with any questions, concerns, or problems.  Larina Bras, NP-C 05/31/2013   5:45 PM  ATTENDING'S ATTESTATION:  I personally reviewed patient's chart, examined patient myself, formulated the  treatment plan as followed.   Overall patient is doing well she's tolerating Gemzar and carboplatinum quite nicely with minimal side effects at this point. She will return in a week's time for cycle 2 of ongoing treatment with curative intent Gemzar carboplatinum. Her platelets are 113,000 I did caution her regarding bleeding.  We will see her back in one week's time for followup.  Drue Second, MD Medical/Oncology [facility] [phone] (beeper) [phone] (Office)  06/08/2013, 8:49 AM

## 2013-05-31 NOTE — Patient Instructions (Addendum)
Please contact us at [phone] if you have any questions or concerns.  Please continue to do well and enjoy life!!!  Get plenty of rest, drink plenty of water, exercise daily (walking), eat a balanced diet.  Take Vitamin D3 1000 IUs daily.   Results for orders placed in visit on 05/31/13 (from the past 24 hour(s))  CBC WITH DIFFERENTIAL     Status: Abnormal   Collection Time    05/31/13  1:28 PM      Result Value Range   WBC 8.1  3.9 - 10.3 10e3/uL   NEUT# 5.5  1.5 - 6.5 10e3/uL   HGB 12.5  11.6 - 15.9 g/dL   HCT 40.9  81.1 - 91.4 %   Platelets 113 (*) 145 - 400 10e3/uL   MCV 92.4  79.5 - 101.0 fL   MCH 31.6  25.1 - 34.0 pg   MCHC 34.2  31.5 - 36.0 g/dL   RBC 7.82  9.56 - 2.13 10e6/uL   RDW 16.2 (*) 11.2 - 14.5 %   lymph# 1.9  0.9 - 3.3 10e3/uL   MONO# 0.7  0.1 - 0.9 10e3/uL   Eosinophils Absolute 0.0  0.0 - 0.5 10e3/uL   Basophils Absolute 0.0  0.0 - 0.1 10e3/uL   NEUT% 67.3  38.4 - 76.8 %   LYMPH% 23.1  14.0 - 49.7 %   MONO% 9.0  0.0 - 14.0 %   EOS% 0.5  0.0 - 7.0 %   BASO% 0.1  0.0 - 2.0 %   Narrative:    Performed At:  [facility]               [address]               [place], [place] [address]  COMPREHENSIVE METABOLIC PANEL (CC13)     Status: Abnormal   Collection Time    05/31/13  1:28 PM      Result Value Range   Sodium 137  136 - 145 mEq/L   Potassium 3.7  3.5 - 5.1 mEq/L   Chloride 101  98 - 109 mEq/L   CO2 23  22 - 29 mEq/L   Glucose 238 (*) 70 - 140 mg/dl   BUN 84.6  7.0 - 96.2 mg/dL   Creatinine 1.1  0.6 - 1.1 mg/dL   Total Bilirubin 9.52  0.20 - 1.20 mg/dL   Alkaline Phosphatase 125  40 - 150 U/L   AST 17  5 - 34 U/L   ALT 14  0 - 55 U/L   Total Protein 7.2  6.4 - 8.3 g/dL   Albumin 3.8  3.5 - 5.0 g/dL   Calcium 84.1  8.4 - 32.4 mg/dL   Anion Gap 13 (*) 3 - 11 mEq/L   Narrative:    Performed At:  [facility]               [address]               [place], [place] [address]

## 2013-06-07 ENCOUNTER — Encounter: Payer: Self-pay | Admitting: Oncology

## 2013-06-07 ENCOUNTER — Encounter: Payer: Self-pay | Admitting: Adult Health

## 2013-06-07 ENCOUNTER — Ambulatory Visit (HOSPITAL_BASED_OUTPATIENT_CLINIC_OR_DEPARTMENT_OTHER): Payer: BC Managed Care – PPO

## 2013-06-07 ENCOUNTER — Ambulatory Visit (HOSPITAL_BASED_OUTPATIENT_CLINIC_OR_DEPARTMENT_OTHER): Payer: BC Managed Care – PPO | Admitting: Adult Health

## 2013-06-07 ENCOUNTER — Other Ambulatory Visit (HOSPITAL_BASED_OUTPATIENT_CLINIC_OR_DEPARTMENT_OTHER): Payer: BC Managed Care – PPO | Admitting: Lab

## 2013-06-07 ENCOUNTER — Telehealth: Payer: Self-pay | Admitting: Oncology

## 2013-06-07 VITALS — BP 108/79 | HR 111 | Temp 98.4°F | Resp 18 | Ht 64.0 in | Wt 209.5 lb

## 2013-06-07 DIAGNOSIS — C773 Secondary and unspecified malignant neoplasm of axilla and upper limb lymph nodes: Secondary | ICD-10-CM

## 2013-06-07 DIAGNOSIS — C50912 Malignant neoplasm of unspecified site of left female breast: Secondary | ICD-10-CM

## 2013-06-07 DIAGNOSIS — C50919 Malignant neoplasm of unspecified site of unspecified female breast: Secondary | ICD-10-CM

## 2013-06-07 DIAGNOSIS — E876 Hypokalemia: Secondary | ICD-10-CM

## 2013-06-07 DIAGNOSIS — D696 Thrombocytopenia, unspecified: Secondary | ICD-10-CM

## 2013-06-07 DIAGNOSIS — Z5111 Encounter for antineoplastic chemotherapy: Secondary | ICD-10-CM

## 2013-06-07 DIAGNOSIS — Z171 Estrogen receptor negative status [ER-]: Secondary | ICD-10-CM

## 2013-06-07 LAB — CBC WITH DIFFERENTIAL/PLATELET
Eosinophils Absolute: 0 10*3/uL (ref 0.0–0.5)
HCT: 30.4 % — ABNORMAL LOW (ref 34.8–46.6)
LYMPH%: 35.6 % (ref 14.0–49.7)
MCH: 30.9 pg (ref 25.1–34.0)
MONO#: 0.3 10*3/uL (ref 0.1–0.9)
NEUT#: 1.9 10*3/uL (ref 1.5–6.5)
Platelets: 70 10*3/uL — ABNORMAL LOW (ref 145–400)
RBC: 3.17 10*6/uL — ABNORMAL LOW (ref 3.70–5.45)
RDW: 16.7 % — ABNORMAL HIGH (ref 11.2–14.5)
WBC: 3.5 10*3/uL — ABNORMAL LOW (ref 3.9–10.3)

## 2013-06-07 LAB — COMPREHENSIVE METABOLIC PANEL (CC13)
Albumin: 3.5 g/dL (ref 3.5–5.0)
Alkaline Phosphatase: 99 U/L (ref 40–150)
Anion Gap: 12 mEq/L — ABNORMAL HIGH (ref 3–11)
BUN: 10.9 mg/dL (ref 7.0–26.0)
CO2: 28 mEq/L (ref 22–29)
Calcium: 9.3 mg/dL (ref 8.4–10.4)
Chloride: 105 mEq/L (ref 98–109)
Glucose: 179 mg/dl — ABNORMAL HIGH (ref 70–140)
Potassium: 3.4 mEq/L — ABNORMAL LOW (ref 3.5–5.1)
Sodium: 144 mEq/L (ref 136–145)
Total Bilirubin: 0.41 mg/dL (ref 0.20–1.20)
Total Protein: 6.4 g/dL (ref 6.4–8.3)

## 2013-06-07 MED ORDER — ONDANSETRON 8 MG/50ML IVPB (CHCC)
8.0000 mg | Freq: Once | INTRAVENOUS | Status: AC
  Administered 2013-06-07: 8 mg via INTRAVENOUS

## 2013-06-07 MED ORDER — SODIUM CHLORIDE 0.9 % IV SOLN
800.0000 mg/m2 | Freq: Once | INTRAVENOUS | Status: AC
  Administered 2013-06-07: 1596 mg via INTRAVENOUS
  Filled 2013-06-07: qty 41.98

## 2013-06-07 MED ORDER — SODIUM CHLORIDE 0.9 % IJ SOLN
10.0000 mL | INTRAMUSCULAR | Status: DC | PRN
Start: 2013-06-07 — End: 2013-06-07
  Administered 2013-06-07: 10 mL
  Filled 2013-06-07: qty 10

## 2013-06-07 MED ORDER — SODIUM CHLORIDE 0.9 % IV SOLN
225.8000 mg | Freq: Once | INTRAVENOUS | Status: AC
  Administered 2013-06-07: 230 mg via INTRAVENOUS
  Filled 2013-06-07: qty 23

## 2013-06-07 MED ORDER — DEXAMETHASONE SODIUM PHOSPHATE 10 MG/ML IJ SOLN
10.0000 mg | Freq: Once | INTRAMUSCULAR | Status: AC
  Administered 2013-06-07: 10 mg via INTRAVENOUS

## 2013-06-07 MED ORDER — DEXAMETHASONE SODIUM PHOSPHATE 10 MG/ML IJ SOLN
INTRAMUSCULAR | Status: AC
  Filled 2013-06-07: qty 1

## 2013-06-07 MED ORDER — SODIUM CHLORIDE 0.9 % IV SOLN
Freq: Once | INTRAVENOUS | Status: AC
  Administered 2013-06-07: 10:00:00 via INTRAVENOUS

## 2013-06-07 MED ORDER — HEPARIN SOD (PORK) LOCK FLUSH 100 UNIT/ML IV SOLN
500.0000 [IU] | Freq: Once | INTRAVENOUS | Status: AC | PRN
  Administered 2013-06-07: 500 [IU]
  Filled 2013-06-07: qty 5

## 2013-06-07 MED ORDER — ONDANSETRON 8 MG/NS 50 ML IVPB
INTRAVENOUS | Status: AC
  Filled 2013-06-07: qty 8

## 2013-06-07 NOTE — Progress Notes (Signed)
Platelets 70 today. OK to treat per Dr. Welton Flakes. Patient educated on thrombocytopenic precautions, she verbalizes understanding.

## 2013-06-07 NOTE — Patient Instructions (Signed)
[facility] Discharge Instructions for Patients Receiving Chemotherapy  Today you received the following chemotherapy agents Gemzar and Carboplatin  To help prevent nausea and vomiting after your treatment, we encourage you to take your nausea medication as prescribed   If you develop nausea and vomiting that is not controlled by your nausea medication, call the clinic.   BELOW ARE SYMPTOMS THAT SHOULD BE REPORTED IMMEDIATELY:  *FEVER GREATER THAN 100.5 F  *CHILLS WITH OR WITHOUT FEVER  NAUSEA AND VOMITING THAT IS NOT CONTROLLED WITH YOUR NAUSEA MEDICATION  *UNUSUAL SHORTNESS OF BREATH  *UNUSUAL BRUISING OR BLEEDING  TENDERNESS IN MOUTH AND THROAT WITH OR WITHOUT PRESENCE OF ULCERS  *URINARY PROBLEMS  *BOWEL PROBLEMS  UNUSUAL RASH Items with * indicate a potential emergency and should be followed up as soon as possible.  Feel free to call the clinic you have any questions or concerns. The clinic phone number is [phone].    

## 2013-06-07 NOTE — Progress Notes (Addendum)
[facility]  [phone] [phone]  OFFICE PROGRESS NOTE  ID: Christine [name]   DOB: 1956/07/16  MR#: [id number]  QMV#:[id number]   PCP: [name], MD SU:  [name], M.D.   DIAGNOSIS: Christine [name] is a 57 y.o.  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   PRIOR THERAPY: 1. Patient has history of right breast cancer patient underwent a mastectomy when she was 57 years old. This occurred during her pregnancy. After that patient did not take any kind of further treatments. She continued to do well.   2. She recently noticed her left nipple had become itchy and retracted.  She underwent mammogram with ultrasound that showed 2 areas of concern in the left breast (at the 12 o'clock position and 3 o'clock position).  An ultrasound performed that showed the diameter to be at least 6 cm and multiple left axillary lymph nodes that were abnormal. A left breast needle core biopsy performed on 09/30/2012 at the 2 o'clock position showed invasive ductal carcinoma grade 3, estrogen receptor negative, progesterone receptor negative,proliferation marker Ki-67 21%,  HER-2/neu negative.  Left breast needle core biopsy at the 3 o'clock position showed grade 3 ductal carcinoma in situ.  3.  Bilateral breast MRI on 10/12/2012 showed a previous right breast mastectomy and marked diffuse enhancement involving majority of the left breast measuring 13.0 x 12.5 x 9.5 cm. There was diffuse skin thickening and dermal enhancement. Multiple enlarged level I left axillary lymph nodes. The largest lymph node measured 2.4 cm in size. The large area of enhancement within the left breast extends posteriorly to abut the anterior portion of the pectoralis muscle. There was no evidence of internal  mammary adenopathy or right axillary adenopathy.   4. PET/CT did show the left breast mass, and left axillary and subpectoralis nodal metastases, but no further areas throughout the chest/abd/pelvis of metastases.   5.  Patient received 6 cycles of neoadjuvant chemotherapy consisting of FEC (5-FU/epirubicin/Cytoxan) from 11/02/2012 through 01/25/2013 with Neulasta support.  This was followed by 8 cycles of weekly Taxol/Carbo that started from 02/08/2013 through 04/19/2013.  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  She began neoadjuvant chemotherapy consisting of Gemzar/Carboplatin on 05/24/2013 with day 2 Neulasta support.   She will receive 2 cycles of this neoadjuvant chemotherapy prior to definitive surgery.  CURRENT THERAPY: Gemcitabine/carboplatin cycle 2 day 1   INTERVAL HISTORY: Christine [name] is a  57 y.o. female who returns for evaluation prior to her final cycle of Gemzar carbo.  She does have mild numbness in her fingertips, but otherwise denies fevers, chills, nausea, vomiting, constipation, diarrhea, or any further concerns.    MEDICAL HISTORY: Past Medical History  Diagnosis Date  . ALLERGIC RHINITIS   . GERD (gastroesophageal reflux disease)   . Hypertension   . Hypothyroidism   . Hx of colonic polyps   . Wears glasses   . Breast cancer 84, age 36    right  . Breast cancer 09/30/12    left, ER/PR -, Her 2 -    ALLERGIES:   Allergies  Allergen Reactions  . Sulfa Antibiotics Hives, Itching and Swelling  . Sulfonamide Derivatives Hives, Itching and Swelling     MEDICATIONS:  Current Outpatient Prescriptions  Medication Sig Dispense Refill  . dexamethasone (DECADRON) 4 MG tablet TAKE 2 TABS PO QD ON DAY AFTER CHEMO THEN 2 TABS BID X 2 DAYS  30 tablet  1  . diltiazem (CARDIZEM CD) 240 MG 24 hr capsule TAKE ONE CAPSULE BY MOUTH EVERY DAY  90 capsule  3  . doxycycline  (VIBRA-TABS) 100 MG tablet Take 1 tablet (100 mg total) by mouth 2 (two) times daily.  14 tablet  2  . fluconazole (DIFLUCAN) 200 MG tablet Take 1 tablet (200 mg total) by mouth every other day.  10 tablet  2  . fluticasone (FLONASE) 50 MCG/ACT nasal spray Place 2 sprays into the nose daily.  16 g  2  . gabapentin (NEURONTIN) 300 MG capsule Take 1 capsule (300 mg total) by mouth 3 (three) times daily. 3 month supply  270 capsule  0  . latanoprost (XALATAN) 0.005 % ophthalmic solution Place 1 drop into both eyes at bedtime.       Marland Kitchen levothyroxine (SYNTHROID, LEVOTHROID) 50 MCG tablet TAKE 1 TABLET BY MOUTH EVERY DAY  90 tablet  2  . lidocaine-prilocaine (EMLA) cream Apply topically as needed.  30 g  6  . LORazepam (ATIVAN) 0.5 MG tablet Take 1 tablet (0.5 mg total) by mouth every 6 (six) hours as needed (Nausea or vomiting).  30 tablet  0  . losartan-hydrochlorothiazide (HYZAAR) 50-12.5 MG per tablet Take 1 tablet by mouth daily.  90 tablet  0  . NEXIUM 40 MG capsule TAKE 1 CAPSULE (40 MG TOTAL) BY MOUTH DAILY BEFORE BREAKFAST.  90 capsule  1  . nystatin (MYCOSTATIN) 100000 UNIT/ML suspension Take 5 mLs (500,000 Units total) by mouth 4 (four) times daily.  240 mL  1  . potassium chloride SA (K-DUR,KLOR-CON) 20 MEQ tablet Take 2 tablets (40 mEq total) by mouth 2 (two) times daily.  120 tablet  0   No current facility-administered medications for this visit.    SURGICAL HISTORY:  Past Surgical History  Procedure Laterality Date  . Tonsillectomy    . Caesarean section      x 1  . Mastectomy  04/1987    right side  . Abdominal hysterectomy  06/1988    fibroids  . Portacath placement Right 10/28/2012    Procedure: PORT PLACEMENT;  Surgeon: Clovis Pu. Cornett, MD;  Location: [facility];  Service: General;  Laterality: Right;  Right Subclavian Vein    REVIEW OF SYSTEMS:  A 10 point review of systems was conducted and is otherwise negative except for what is noted above.       PHYSICAL EXAMINATION: Blood pressure 108/79, pulse 111, temperature 98.4 F (36.9 C), temperature source Oral, resp. rate 18, height 5\' 4"  (1.626 m), weight 209 lb 8 oz (95.029 kg). Body mass index is 35.94 kg/(m^2). General: Patient is a well appearing female in no acute distress HEENT: PERRLA, sclerae anicteric no conjunctival pallor, MMM Neck: supple, no palpable adenopathy Lungs: clear to auscultation bilaterally, no wheezes, rhonchi, or rales Cardiovascular: regular rate rhythm, S1, S2, no murmurs, rubs or gallops Abdomen: Soft, non-tender, non-distended, normoactive bowel sounds, no HSM Extremities: warm and well perfused, no clubbing, cyanosis, or edema Skin: No rashes or lesions Neuro: Non-focal ECOG PERFORMANCE STATUS: 1 - Symptomatic but completely ambulatory     LABORATORY DATA: Lab Results  Component Value Date   WBC 3.5* 06/07/2013   HGB 9.8* 06/07/2013   HCT 30.4* 06/07/2013   MCV 95.9 06/07/2013   PLT 70* 06/07/2013  Chemistry      Component Value Date/Time   NA 137 05/31/2013 1328   NA 144 10/28/2012 1248   K 3.7 05/31/2013 1328   K 3.9 10/28/2012 1248   CL 101 01/25/2013 0952   CL 108 10/28/2012 1248   CO2 23 05/31/2013 1328   CO2 29 09/13/2011 1910   BUN 17.7 05/31/2013 1328   BUN 10 10/28/2012 1248   CREATININE 1.1 05/31/2013 1328   CREATININE 0.80 10/28/2012 1248      Component Value Date/Time   CALCIUM 10.0 05/31/2013 1328   CALCIUM 9.4 09/13/2011 1910   ALKPHOS 125 05/31/2013 1328   ALKPHOS 78 09/13/2011 1910   AST 17 05/31/2013 1328   AST 18 09/13/2011 1910   ALT 14 05/31/2013 1328   ALT 12 09/13/2011 1910   BILITOT 0.36 05/31/2013 1328   BILITOT 0.3 09/13/2011 1910       RADIOGRAPHIC STUDIES: No results found.    ASSESSMENT:   Christine [name] is a 57 y.o. female with:   #1 History of breast cancer of the right breast status post mastectomy when she was 57 years old. Due to this she was referred for genetic testing.   #2 She recently  noticed her left nipple had become itchy and retracted.  She underwent mammogram with ultrasound that showed 2 areas of concern in the left breast (at the 12 o'clock position and 3 o'clock position).  An ultrasound performed that showed the diameter to be at least 6 cm and multiple left axillary lymph nodes that were abnormal. A left breast needle core biopsy performed on 09/30/2012 at the 2 o'clock position showed invasive ductal carcinoma grade 3, estrogen receptor negative, progesterone receptor negative,proliferation marker Ki-67 21%,  HER-2/neu negative.  Left breast needle core biopsy at the 3 o'clock position showed grade 3 ductal carcinoma in situ.  #3  Bilateral breast MRI on 10/12/2012 showed a previous right breast mastectomy and marked diffuse enhancement involving majority of the left breast measuring 13.0 x 12.5 x 9.5 cm. There was diffuse skin thickening and dermal enhancement. Multiple enlarged level I left axillary lymph nodes. The largest lymph node measured 2.4 cm in size. The large area of enhancement within the left breast extends posteriorly to abut the anterior portion of the pectoralis muscle. There was no evidence of internal mammary adenopathy or right axillary adenopathy.   #4. PET/CT did show the left breast mass, and left axillary and subpectoralis nodal metastases, but no further areas throughout the chest/abd/pelvis of metastases.  She then went on to receive a 2-D echocardiogram, Port-A-Cath placement, and attended chemotherapy class.   #5   Patient received 6 cycles of neoadjuvant chemotherapy consisting of FEC (5-FU/epirubicin/Cytoxan) from 11/02/2012 through 01/25/2013 with Neulasta support.  This was followed by 8 cycles of weekly Taxol/Carbo that started from 02/08/2013 through 04/19/2013.  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  She began neoadjuvant chemotherapy consisting of  Gemzar/Carboplatin on 05/24/2013 with day 2 Neulasta support.   She will receive 2 cycles of this neoadjuvant chemotherapy prior to definitive surgery.  Left breast mastectomy has been discussed with Dr. Luisa Hart.  #6  Hypokalemia--on Kdur BID, which has helped her potassium normalize.  She will continue this, and we will follow her potassium every week.     PLAN:  #1 Doing well.  I reviewed her labs with her in detail.  She is thrombocytopenic today.  Her platelets are 70.  Dr.  Welton Flakes approved her to proceed with chemotherapy.  I discussed this with her and the risks involved, should the platelets drop lower.    #2 She will return tomorrow for Neulasta, Friday for a lab only, and next week for labs and evaluation for chemotoxicities.    All questions answered.  Ms. Schuhmacher was encouraged to contact us in the interim with any questions, concerns, or problems.  I spent 25 minutes counseling the patient face to face.  The total time spent in the appointment was 30 minutes.   Illa Level, NP Medical Oncology [facility] [phone]  ATTENDING'S ATTESTATION:  I personally reviewed patient's chart, examined patient myself, formulated the treatment plan as followed.    Overall patient is tolerating her chemotherapy well. She is due for Gemzar carboplatinum. Unfortunately she is thrombocytopenic with a platelet count of 70,000. This is her last treatment. Therefore I do think you'll do fine with proceeding with the chemotherapy. We will keep a close eye on her platelets. She knows to report any bleeding problems.  Drue Second, MD Medical/Oncology [facility] [phone] (beeper) [phone] (Office)  06/10/2013, 3:33 PM

## 2013-06-07 NOTE — Telephone Encounter (Signed)
, °

## 2013-06-07 NOTE — Patient Instructions (Signed)
Thrombocytopenia Thrombocytopenia is a condition in which there is an abnormally small number of platelets in your blood. Platelets are also called thrombocytes. Platelets are needed for blood clotting. CAUSES Thrombocytopenia is caused by:   Decreased production of platelets. This can be caused by:  Aplastic anemia in which your bone marrow quits making blood cells.  Cancer in the bone marrow.  Use of certain medicines, including chemotherapy.  Infection in the bone marrow.  Heavy alcohol consumption.  Increased destruction of platelets. This can be caused by:  Certain immune diseases.  Use of certain drugs.  Certain blood clotting disorders.  Certain inherited disorders.  Certain bleeding disorders.  Pregnancy.  Having an enlarged spleen (hypersplenism). In hypersplenism, the spleen gathers up platelets from circulation. This means the platelets are not available to help with blood clotting. The spleen can enlarge due to cirrhosis or other conditions. SYMPTOMS  The symptoms of thrombocytopenia are side effects of poor blood clotting. Some of these are:  Abnormal bleeding.  Nosebleeds.  Heavy menstrual periods.  Blood in the urine or stools.  Purpura. This is a purplish discoloration in the skin produced by small bleeding vessels near the surface of the skin.  Bruising.  A rash that may be petechial. This looks like pinpoint, purplish-red spots on the skin and mucous membranes. It is caused by bleeding from small blood vessels (capillaries). DIAGNOSIS  Your caregiver will make this diagnosis based on your exam and blood tests. Sometimes, a bone marrow study is done to look for the original cells (megakaryocytes) that make platelets. TREATMENT  Treatment depends on the cause of the condition.  Medicines may be given to help protect your platelets from being destroyed.  In some cases, a replacement (transfusion) of platelets may be required to stop or prevent  bleeding.  Sometimes, the spleen must be surgically removed. HOME CARE INSTRUCTIONS   Check the skin and linings inside your mouth for bruising or bleeding as directed by your caregiver.  Check your sputum, urine, and stool for blood as directed by your caregiver.  Do not return to any activities that could cause bumps or bruises until your caregiver says it is okay.  Take extra care not to cut yourself when shaving or when using scissors, needles, knives, and other tools.  Take extra care not to burn yourself when ironing or cooking.  Ask your caregiver if it is okay for you to drink alcohol.  Only take over-the-counter or prescription medicines as directed by your caregiver.  Notify all your caregivers, including dentists and eye doctors, about your condition. SEEK IMMEDIATE MEDICAL CARE IF:   You develop active bleeding from anywhere in your body.  You develop unexplained bruising or bleeding.  You have blood in your sputum, urine, or stool. MAKE SURE YOU:  Understand these instructions.  Will watch your condition.  Will get help right away if you are not doing well or get worse. Document Released: 07/21/2005 Document Revised: 10/13/2011 Document Reviewed: 05/23/2011 [employer] Patient Information 2014 [employer].  

## 2013-06-08 ENCOUNTER — Ambulatory Visit (HOSPITAL_BASED_OUTPATIENT_CLINIC_OR_DEPARTMENT_OTHER): Payer: BC Managed Care – PPO

## 2013-06-08 VITALS — BP 118/67 | HR 103 | Temp 98.6°F

## 2013-06-08 DIAGNOSIS — Z5189 Encounter for other specified aftercare: Secondary | ICD-10-CM

## 2013-06-08 DIAGNOSIS — C50912 Malignant neoplasm of unspecified site of left female breast: Secondary | ICD-10-CM

## 2013-06-08 DIAGNOSIS — C50919 Malignant neoplasm of unspecified site of unspecified female breast: Secondary | ICD-10-CM

## 2013-06-08 MED ORDER — PEGFILGRASTIM INJECTION 6 MG/0.6ML
6.0000 mg | Freq: Once | SUBCUTANEOUS | Status: AC
  Administered 2013-06-08: 6 mg via SUBCUTANEOUS
  Filled 2013-06-08: qty 0.6

## 2013-06-09 ENCOUNTER — Other Ambulatory Visit: Payer: Self-pay

## 2013-06-10 ENCOUNTER — Other Ambulatory Visit (HOSPITAL_BASED_OUTPATIENT_CLINIC_OR_DEPARTMENT_OTHER): Payer: BC Managed Care – PPO | Admitting: Lab

## 2013-06-10 ENCOUNTER — Telehealth: Payer: Self-pay | Admitting: Emergency Medicine

## 2013-06-10 DIAGNOSIS — C50919 Malignant neoplasm of unspecified site of unspecified female breast: Secondary | ICD-10-CM

## 2013-06-10 DIAGNOSIS — C50912 Malignant neoplasm of unspecified site of left female breast: Secondary | ICD-10-CM

## 2013-06-10 DIAGNOSIS — C773 Secondary and unspecified malignant neoplasm of axilla and upper limb lymph nodes: Secondary | ICD-10-CM

## 2013-06-10 LAB — CBC WITH DIFFERENTIAL/PLATELET
BASO%: 0 % (ref 0.0–2.0)
Basophils Absolute: 0 10*3/uL (ref 0.0–0.1)
EOS%: 0 % (ref 0.0–7.0)
Eosinophils Absolute: 0 10*3/uL (ref 0.0–0.5)
HGB: 10 g/dL — ABNORMAL LOW (ref 11.6–15.9)
MCHC: 34.1 g/dL (ref 31.5–36.0)
MCV: 94.2 fL (ref 79.5–101.0)
MONO%: 1.7 % (ref 0.0–14.0)
NEUT#: 3 10*3/uL (ref 1.5–6.5)
RBC: 3.11 10*6/uL — ABNORMAL LOW (ref 3.70–5.45)
RDW: 16.7 % — ABNORMAL HIGH (ref 11.2–14.5)
lymph#: 0.4 10*3/uL — ABNORMAL LOW (ref 0.9–3.3)

## 2013-06-10 LAB — COMPREHENSIVE METABOLIC PANEL (CC13)
ALT: 42 U/L (ref 0–55)
AST: 34 U/L (ref 5–34)
Albumin: 3.8 g/dL (ref 3.5–5.0)
Calcium: 9.3 mg/dL (ref 8.4–10.4)
Chloride: 101 mEq/L (ref 98–109)
Potassium: 4 mEq/L (ref 3.5–5.1)
Sodium: 139 mEq/L (ref 136–145)
Total Protein: 7.2 g/dL (ref 6.4–8.3)

## 2013-06-10 NOTE — Telephone Encounter (Signed)
Spoke to patient. Gave her glucose results. Instructed her that Dr Welton Flakes would like for her to go to the ED for eval of glucose of 466. Patient verbalized understanding.

## 2013-06-14 ENCOUNTER — Telehealth: Payer: Self-pay | Admitting: Oncology

## 2013-06-14 ENCOUNTER — Ambulatory Visit (HOSPITAL_BASED_OUTPATIENT_CLINIC_OR_DEPARTMENT_OTHER): Payer: BC Managed Care – PPO | Admitting: Oncology

## 2013-06-14 ENCOUNTER — Other Ambulatory Visit (HOSPITAL_BASED_OUTPATIENT_CLINIC_OR_DEPARTMENT_OTHER): Payer: BC Managed Care – PPO | Admitting: Lab

## 2013-06-14 ENCOUNTER — Ambulatory Visit (HOSPITAL_BASED_OUTPATIENT_CLINIC_OR_DEPARTMENT_OTHER): Payer: BC Managed Care – PPO

## 2013-06-14 ENCOUNTER — Encounter: Payer: Self-pay | Admitting: Oncology

## 2013-06-14 VITALS — BP 129/91 | HR 112 | Temp 98.5°F | Resp 18 | Ht 64.0 in | Wt 200.7 lb

## 2013-06-14 DIAGNOSIS — E860 Dehydration: Secondary | ICD-10-CM

## 2013-06-14 DIAGNOSIS — C50912 Malignant neoplasm of unspecified site of left female breast: Secondary | ICD-10-CM

## 2013-06-14 DIAGNOSIS — C50919 Malignant neoplasm of unspecified site of unspecified female breast: Secondary | ICD-10-CM

## 2013-06-14 DIAGNOSIS — E876 Hypokalemia: Secondary | ICD-10-CM

## 2013-06-14 DIAGNOSIS — Z95828 Presence of other vascular implants and grafts: Secondary | ICD-10-CM

## 2013-06-14 DIAGNOSIS — D696 Thrombocytopenia, unspecified: Secondary | ICD-10-CM

## 2013-06-14 LAB — COMPREHENSIVE METABOLIC PANEL (CC13)
AST: 17 U/L (ref 5–34)
Albumin: 3.9 g/dL (ref 3.5–5.0)
Alkaline Phosphatase: 114 U/L (ref 40–150)
Anion Gap: 13 mEq/L — ABNORMAL HIGH (ref 3–11)
BUN: 24.5 mg/dL (ref 7.0–26.0)
Potassium: 3.7 mEq/L (ref 3.5–5.1)

## 2013-06-14 LAB — CBC WITH DIFFERENTIAL/PLATELET
Basophils Absolute: 0 10*3/uL (ref 0.0–0.1)
EOS%: 0.2 % (ref 0.0–7.0)
HGB: 12.3 g/dL (ref 11.6–15.9)
MCH: 32.1 pg (ref 25.1–34.0)
MCHC: 33.7 g/dL (ref 31.5–36.0)
MCV: 95.4 fL (ref 79.5–101.0)
MONO#: 0.3 10*3/uL (ref 0.1–0.9)
MONO%: 7.7 % (ref 0.0–14.0)
RBC: 3.82 10*6/uL (ref 3.70–5.45)
RDW: 18.4 % — ABNORMAL HIGH (ref 11.2–14.5)

## 2013-06-14 MED ORDER — SODIUM CHLORIDE 0.9 % IV SOLN
1000.0000 mL | INTRAVENOUS | Status: DC
  Administered 2013-06-14: 1000 mL via INTRAVENOUS

## 2013-06-14 MED ORDER — SODIUM CHLORIDE 0.9 % IJ SOLN
10.0000 mL | INTRAMUSCULAR | Status: DC | PRN
  Administered 2013-06-14: 10 mL via INTRAVENOUS
  Filled 2013-06-14: qty 10

## 2013-06-14 MED ORDER — HEPARIN SOD (PORK) LOCK FLUSH 100 UNIT/ML IV SOLN
500.0000 [IU] | Freq: Once | INTRAVENOUS | Status: AC
  Administered 2013-06-14: 500 [IU] via INTRAVENOUS
  Filled 2013-06-14: qty 5

## 2013-06-14 NOTE — Patient Instructions (Signed)
Dehydration, Adult Dehydration is when you lose more fluids from the body than you take in. Vital organs like the kidneys, brain, and heart cannot function without a proper amount of fluids and salt. Any loss of fluids from the body can cause dehydration.  CAUSES   Vomiting.  Diarrhea.  Excessive sweating.  Excessive urine output.  Fever. SYMPTOMS  Mild dehydration  Thirst.  Dry lips.  Slightly dry mouth.\[id number]\ Moderate dehydration  Very dry mouth.  Sunken eyes.  Skin does not bounce back quickly when lightly pinched and released.  Dark urine and decreased urine production.  Decreased tear production.  Headache. Severe dehydration  Very dry mouth.  Extreme thirst.  Rapid, weak pulse (more than 100 beats per minute at rest).  Cold hands and feet.  Not able to sweat in spite of heat and temperature.  Rapid breathing.  Blue lips.  Confusion and lethargy.  Difficulty being awakened.  Minimal urine production.  No tears. DIAGNOSIS  Your caregiver will diagnose dehydration based on your symptoms and your exam. Blood and urine tests will help confirm the diagnosis. The diagnostic evaluation should also identify the cause of dehydration. TREATMENT  Treatment of mild or moderate dehydration can often be done at home by increasing the amount of fluids that you drink. It is best to drink small amounts of fluid more often. Drinking too much at one time can make vomiting worse. Refer to the home care instructions below. Severe dehydration needs to be treated at the hospital where you will probably be given intravenous (IV) fluids that contain water and electrolytes. HOME CARE INSTRUCTIONS   Ask your caregiver about specific rehydration instructions.  Drink enough fluids to keep your urine clear or pale yellow.  Drink small amounts frequently if you have nausea and vomiting.  Eat as you normally do.  Avoid:  Foods or drinks high in  sugar.  Carbonated drinks.  Juice.  Extremely hot or cold fluids.  Drinks with caffeine.  Fatty, greasy foods.  Alcohol.  Tobacco.  Overeating.  Gelatin desserts.  Wash your hands well to avoid spreading bacteria and viruses.  Only take over-the-counter or prescription medicines for pain, discomfort, or fever as directed by your caregiver.  Ask your caregiver if you should continue all prescribed and over-the-counter medicines.  Keep all follow-up appointments with your caregiver. SEEK MEDICAL CARE IF:  You have abdominal pain and it increases or stays in one area (localizes).  You have a rash, stiff neck, or severe headache.  You are irritable, sleepy, or difficult to awaken.  You are weak, dizzy, or extremely thirsty. SEEK IMMEDIATE MEDICAL CARE IF:   You are unable to keep fluids down or you get worse despite treatment.  You have frequent episodes of vomiting or diarrhea.  You have blood or green matter (bile) in your vomit.  You have blood in your stool or your stool looks black and tarry.  You have not urinated in 6 to 8 hours, or you have only urinated a small amount of very dark urine.  You have a fever.  You faint. MAKE SURE YOU:   Understand these instructions.  Will watch your condition.  Will get help right away if you are not doing well or get worse. Document Released: 07/21/2005 Document Revised: 10/13/2011 Document Reviewed: 03/10/2011 [facility] Patient Information 2014 [place], [place].

## 2013-06-14 NOTE — Progress Notes (Signed)
[facility]  Telephone:(336) [phone] Fax:(336) [phone]  OFFICE PROGRESS NOTE  ID: Christine [name]   DOB: 09/18/1955  MR#: [id number]  XBM#:[id number]   PCP: [name], MD SU:  [name], M.D.   DIAGNOSIS: Christine [name] is a 57 y.o.  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   PRIOR THERAPY: 1. Patient has history of right breast cancer patient underwent a mastectomy when she was 57 years old. This occurred during her pregnancy. After that patient did not take any kind of further treatments. She continued to do well.   2. She recently noticed her left nipple had become itchy and retracted.  She underwent mammogram with ultrasound that showed 2 areas of concern in the left breast (at the 12 o'clock position and 3 o'clock position).  An ultrasound performed that showed the diameter to be at least 6 cm and multiple left axillary lymph nodes that were abnormal. A left breast needle core biopsy performed on 09/30/2012 at the 2 o'clock position showed invasive ductal carcinoma grade 3, estrogen receptor negative, progesterone receptor negative,proliferation marker Ki-67 21%,  HER-2/neu negative.  Left breast needle core biopsy at the 3 o'clock position showed grade 3 ductal carcinoma in situ.  3.  Bilateral breast MRI on 10/12/2012 showed a previous right breast mastectomy and marked diffuse enhancement involving majority of the left breast measuring 13.0 x 12.5 x 9.5 cm. There was diffuse skin thickening and dermal enhancement. Multiple enlarged level I left axillary lymph nodes. The largest lymph node measured 2.4 cm in size. The large area of enhancement within the left breast extends posteriorly to abut the anterior portion of the pectoralis muscle. There was no evidence of internal  mammary adenopathy or right axillary adenopathy.   4. PET/CT did show the left breast mass, and left axillary and subpectoralis nodal metastases, but no further areas throughout the chest/abd/pelvis of metastases.   5.  Patient received 6 cycles of neoadjuvant chemotherapy consisting of FEC (5-FU/epirubicin/Cytoxan) from 11/02/2012 through 01/25/2013 with Neulasta support.  This was followed by 8 cycles of weekly Taxol/Carbo that started from 02/08/2013 through 04/19/2013.  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  She began neoadjuvant chemotherapy consisting of Gemzar/Carboplatin on 05/24/2013 with day 2 Neulasta support.   She will receive 2 cycles of this neoadjuvant chemotherapy prior to definitive surgery.  CURRENT THERAPY: Gemcitabine/carboplatin cycle 2 day 1   INTERVAL HISTORY: Christine [name] is a  57 y.o. female who returns for evaluation prior to her final cycle of Gemzar carbo.  She does have mild numbness in her fingertips, but otherwise denies fevers, chills, nausea, vomiting, constipation, diarrhea, or any further concerns.    MEDICAL HISTORY: Past Medical History  Diagnosis Date  . ALLERGIC RHINITIS   . GERD (gastroesophageal reflux disease)   . Hypertension   . Hypothyroidism   . Hx of colonic polyps   . Wears glasses   . Breast cancer 23, age 47    right  . Breast cancer 09/30/12    left, ER/PR -, Her 2 -    ALLERGIES:   Allergies  Allergen Reactions  . Sulfa Antibiotics Hives, Itching and Swelling  . Sulfonamide Derivatives Hives, Itching and Swelling     MEDICATIONS:  Current Outpatient Prescriptions  Medication Sig Dispense Refill  . dexamethasone (DECADRON) 4 MG tablet TAKE 2 TABS PO QD ON DAY AFTER CHEMO THEN 2 TABS BID X 2 DAYS  30 tablet  1  . diltiazem (CARDIZEM CD) 240 MG 24 hr capsule TAKE ONE CAPSULE BY MOUTH EVERY DAY  90 capsule  3  . fluconazole  (DIFLUCAN) 200 MG tablet Take 1 tablet (200 mg total) by mouth every other day.  10 tablet  2  . fluticasone (FLONASE) 50 MCG/ACT nasal spray Place 2 sprays into the nose daily.  16 g  2  . gabapentin (NEURONTIN) 300 MG capsule Take 1 capsule (300 mg total) by mouth 3 (three) times daily. 3 month supply  270 capsule  0  . latanoprost (XALATAN) 0.005 % ophthalmic solution Place 1 drop into both eyes at bedtime.       Marland Kitchen levothyroxine (SYNTHROID, LEVOTHROID) 50 MCG tablet TAKE 1 TABLET BY MOUTH EVERY DAY  90 tablet  2  . lidocaine-prilocaine (EMLA) cream Apply topically as needed.  30 g  6  . LORazepam (ATIVAN) 0.5 MG tablet Take 1 tablet (0.5 mg total) by mouth every 6 (six) hours as needed (Nausea or vomiting).  30 tablet  0  . losartan-hydrochlorothiazide (HYZAAR) 50-12.5 MG per tablet Take 1 tablet by mouth daily.  90 tablet  0  . NEXIUM 40 MG capsule TAKE 1 CAPSULE (40 MG TOTAL) BY MOUTH DAILY BEFORE BREAKFAST.  90 capsule  1  . nystatin (MYCOSTATIN) 100000 UNIT/ML suspension Take 5 mLs (500,000 Units total) by mouth 4 (four) times daily.  240 mL  1  . potassium chloride SA (K-DUR,KLOR-CON) 20 MEQ tablet Take 2 tablets (40 mEq total) by mouth 2 (two) times daily.  120 tablet  0   No current facility-administered medications for this visit.    SURGICAL HISTORY:  Past Surgical History  Procedure Laterality Date  . Tonsillectomy    . Caesarean section      x 1  . Mastectomy  04/1987    right side  . Abdominal hysterectomy  06/1988    fibroids  . Portacath placement Right 10/28/2012    Procedure: PORT PLACEMENT;  Surgeon: Clovis Pu. Cornett, MD;  Location:  [facility];  Service: General;  Laterality: Right;  Right Subclavian Vein    REVIEW OF SYSTEMS:  A 10 point review of systems was conducted and is otherwise negative except for what is noted above.      PHYSICAL EXAMINATION: Blood pressure 129/91, pulse 112, temperature 98.5 F (36.9 C), temperature source Oral, resp.  rate 18, height 5\' 4"  (1.626 m), weight 200 lb 11.2 oz (91.037 kg). Body mass index is 34.43 kg/(m^2). General: Patient is a well appearing female in no acute distress HEENT: PERRLA, sclerae anicteric no conjunctival pallor, MMM Neck: supple, no palpable adenopathy Lungs: clear to auscultation bilaterally, no wheezes, rhonchi, or rales Cardiovascular: regular rate rhythm, S1, S2, no murmurs, rubs or gallops Abdomen: Soft, non-tender, non-distended, normoactive bowel sounds, no HSM Extremities: warm and well perfused, no clubbing, cyanosis, or edema Skin: No rashes or lesions Neuro: Non-focal ECOG PERFORMANCE STATUS: 1 - Symptomatic but completely ambulatory     LABORATORY DATA: Lab Results  Component Value Date   WBC 3.3* 06/14/2013   HGB 12.3 06/14/2013   HCT 36.4 06/14/2013   MCV 95.4 06/14/2013   PLT 42* 06/14/2013      Chemistry      Component Value Date/Time   NA 139 06/10/2013 1121   NA 144  10/28/2012 1248   K 4.0 06/10/2013 1121   K 3.9 10/28/2012 1248   CL 101 01/25/2013 0952   CL 108 10/28/2012 1248   CO2 20* 06/10/2013 1121   CO2 29 09/13/2011 1910   BUN 22.0 06/10/2013 1121   BUN 10 10/28/2012 1248   CREATININE 1.1 06/10/2013 1121   CREATININE 0.80 10/28/2012 1248      Component Value Date/Time   CALCIUM 9.3 06/10/2013 1121   CALCIUM 9.4 09/13/2011 1910   ALKPHOS 110 06/10/2013 1121   ALKPHOS 78 09/13/2011 1910   AST 34 06/10/2013 1121   AST 18 09/13/2011 1910   ALT 42 06/10/2013 1121   ALT 12 09/13/2011 1910   BILITOT 0.62 06/10/2013 1121   BILITOT 0.3 09/13/2011 1910       RADIOGRAPHIC STUDIES: No results found.    ASSESSMENT:   Christine [name] is a 57 y.o. female with:   #1 History of breast cancer of the right breast status post mastectomy when she was 57 years old. Due to this she was referred for genetic testing.   #2 She recently noticed her left nipple had become itchy and retracted.  She underwent mammogram with ultrasound that showed 2 areas of concern in  the left breast (at the 12 o'clock position and 3 o'clock position).  An ultrasound performed that showed the diameter to be at least 6 cm and multiple left axillary lymph nodes that were abnormal. A left breast needle core biopsy performed on 09/30/2012 at the 2 o'clock position showed invasive ductal carcinoma grade 3, estrogen receptor negative, progesterone receptor negative,proliferation marker Ki-67 21%,  HER-2/neu negative.  Left breast needle core biopsy at the 3 o'clock position showed grade 3 ductal carcinoma in situ.  #3  Bilateral breast MRI on 10/12/2012 showed a previous right breast mastectomy and marked diffuse enhancement involving majority of the left breast measuring 13.0 x 12.5 x 9.5 cm. There was diffuse skin thickening and dermal enhancement. Multiple enlarged level I left axillary lymph nodes. The largest lymph node measured 2.4 cm in size. The large area of enhancement within the left breast extends posteriorly to abut the anterior portion of the pectoralis muscle. There was no evidence of internal mammary adenopathy or right axillary adenopathy.   #4. PET/CT did show the left breast mass, and left axillary and subpectoralis nodal metastases, but no further areas throughout the chest/abd/pelvis of metastases.  She then went on to receive a 2-D echocardiogram, Port-A-Cath placement, and attended chemotherapy class.   #5   Patient received 6 cycles of neoadjuvant chemotherapy consisting of FEC (5-FU/epirubicin/Cytoxan) from 11/02/2012 through 01/25/2013 with Neulasta support.  This was followed by 8 cycles of weekly Taxol/Carbo that started from 02/08/2013 through 04/19/2013.  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  She began neoadjuvant chemotherapy consisting of Gemzar/Carboplatin on 05/24/2013 with day 2 Neulasta support.   She will receive 2 cycles of this neoadjuvant chemotherapy prior to  definitive surgery.  Left breast mastectomy has been discussed with Dr. Luisa Hart.  #6  Hypokalemia--on Kdur BID, which has helped her potassium normalize.  She will continue this, and we will follow her potassium every week.     PLAN:  #1 Doing well.  I reviewed her labs with her in detail.  She is thrombocytopenic today.  Her platelets are 70.  Dr. Welton Flakes approved her to proceed with chemotherapy.  I discussed this with her and the risks involved, should the  platelets drop lower.    #2 She will return tomorrow for Neulasta, Friday for a lab only, and next week for labs and evaluation for chemotoxicities.    All questions answered.  Ms. Recendiz was encouraged to contact us in the interim with any questions, concerns, or problems.  I spent 25 minutes counseling the patient face to face.  The total time spent in the appointment was 30 minutes.  Drue Second, MD Medical/Oncology [facility] [phone] (beeper) [phone] (Office)  06/14/2013, 2:40 PM

## 2013-06-14 NOTE — Telephone Encounter (Signed)
gv pt appt schedule for November and December. No lab for 12/9 f/u.

## 2013-06-14 NOTE — Progress Notes (Signed)
[facility]  Telephone:(336) [phone] Fax:(336) [phone]  OFFICE PROGRESS NOTE  ID: Christine [name]   DOB: 07-23-1956  MR#: [id number]  JYN#:[id number]   PCP: [name], MD SU:  [name], M.D.   DIAGNOSIS: Christine [name] is a 57 y.o.  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   PRIOR THERAPY: 1. Patient has history of right breast cancer patient underwent a mastectomy when she was 57 years old. This occurred during her pregnancy. After that patient did not take any kind of further treatments. She continued to do well.   2. She recently noticed her left nipple had become itchy and retracted.  She underwent mammogram with ultrasound that showed 2 areas of concern in the left breast (at the 12 o'clock position and 3 o'clock position).  An ultrasound performed that showed the diameter to be at least 6 cm and multiple left axillary lymph nodes that were abnormal. A left breast needle core biopsy performed on 09/30/2012 at the 2 o'clock position showed invasive ductal carcinoma grade 3, estrogen receptor negative, progesterone receptor negative,proliferation marker Ki-67 21%,  HER-2/neu negative.  Left breast needle core biopsy at the 3 o'clock position showed grade 3 ductal carcinoma in situ.  3.  Bilateral breast MRI on 10/12/2012 showed a previous right breast mastectomy and marked diffuse enhancement involving majority of the left breast measuring 13.0 x 12.5 x 9.5 cm. There was diffuse skin thickening and dermal enhancement. Multiple enlarged level I left axillary lymph nodes. The largest lymph node measured 2.4 cm in size. The large area of enhancement within the left breast extends posteriorly to abut the anterior portion of the pectoralis muscle. There was no evidence of internal  mammary adenopathy or right axillary adenopathy.   4. PET/CT did show the left breast mass, and left axillary and subpectoralis nodal metastases, but no further areas throughout the chest/abd/pelvis of metastases.   5.  Patient received 6 cycles of neoadjuvant chemotherapy consisting of FEC (5-FU/epirubicin/Cytoxan) from 11/02/2012 through 01/25/2013 with Neulasta support.  This was followed by 8 cycles of weekly Taxol/Carbo that started from 02/08/2013 through 04/19/2013.  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  She began neoadjuvant chemotherapy consisting of Gemzar/Carboplatin on 05/24/2013 with day 2 Neulasta support.   Gemzar/carboplatinum completed 06/06/2013 for a total of 2 cycles..  CURRENT THERAPY: s/p Gemcitabine/carboplatin cycle 2 day 8   INTERVAL HISTORY: Christine [name] is a  57 y.o. female who returns for followup visit today. Clinically she seems to be doing okay she has no bleeding problems even though her platelet count is low today. She however is a little dizzy and woozy when she gets out. She is orthostatic and dehydrated likely due to having had diarrhea for the last few days. She's denying nausea or vomiting no abdominal  pain she has no fevers chills or night sweats. She does have some fatigue myalgias. No arthralgias. Remainder of the template review of systems is negative.  MEDICAL HISTORY: Past Medical History  Diagnosis Date  . ALLERGIC RHINITIS   . GERD (gastroesophageal reflux disease)   . Hypertension   . Hypothyroidism   . Hx of colonic polyps   . Wears glasses   . Breast cancer 40, age 41    right  .  Breast cancer 09/30/12    left, ER/PR -, Her 2 -    ALLERGIES:   Allergies  Allergen Reactions  . Sulfa Antibiotics Hives, Itching and Swelling  . Sulfonamide Derivatives Hives, Itching and Swelling     MEDICATIONS:  Current Outpatient Prescriptions   Medication Sig Dispense Refill  . dexamethasone (DECADRON) 4 MG tablet TAKE 2 TABS PO QD ON DAY AFTER CHEMO THEN 2 TABS BID X 2 DAYS  30 tablet  1  . diltiazem (CARDIZEM CD) 240 MG 24 hr capsule TAKE ONE CAPSULE BY MOUTH EVERY DAY  90 capsule  3  . fluconazole (DIFLUCAN) 200 MG tablet Take 1 tablet (200 mg total) by mouth every other day.  10 tablet  2  . fluticasone (FLONASE) 50 MCG/ACT nasal spray Place 2 sprays into the nose daily.  16 g  2  . gabapentin (NEURONTIN) 300 MG capsule Take 1 capsule (300 mg total) by mouth 3 (three) times daily. 3 month supply  270 capsule  0  . latanoprost (XALATAN) 0.005 % ophthalmic solution Place 1 drop into both eyes at bedtime.       Marland Kitchen levothyroxine (SYNTHROID, LEVOTHROID) 50 MCG tablet TAKE 1 TABLET BY MOUTH EVERY DAY  90 tablet  2  . lidocaine-prilocaine (EMLA) cream Apply topically as needed.  30 g  6  . LORazepam (ATIVAN) 0.5 MG tablet Take 1 tablet (0.5 mg total) by mouth every 6 (six) hours as needed (Nausea or vomiting).  30 tablet  0  . losartan-hydrochlorothiazide (HYZAAR) 50-12.5 MG per tablet Take 1 tablet by mouth daily.  90 tablet  0  . NEXIUM 40 MG capsule TAKE 1 CAPSULE (40 MG TOTAL) BY MOUTH DAILY BEFORE BREAKFAST.  90 capsule  1  . nystatin (MYCOSTATIN) 100000 UNIT/ML suspension Take 5 mLs (500,000 Units total) by mouth 4 (four) times daily.  240 mL  1  . potassium chloride SA (K-DUR,KLOR-CON) 20 MEQ tablet Take 2 tablets (40 mEq total) by mouth 2 (two) times daily.  120 tablet  0   No current facility-administered medications for this visit.    SURGICAL HISTORY:  Past Surgical History  Procedure Laterality Date  . Tonsillectomy    . Caesarean section      x 1  . Mastectomy  04/1987    right side  . Abdominal hysterectomy  06/1988    fibroids  . Portacath placement Right 10/28/2012    Procedure: PORT PLACEMENT;  Surgeon: Clovis Pu. Cornett, MD;  Location: [facility];  Service: General;  Laterality: Right;  Right  Subclavian Vein    REVIEW OF SYSTEMS:  A 10 point review of systems was conducted and is otherwise negative except for what is noted above.      PHYSICAL EXAMINATION: Blood pressure 129/91, pulse 112, temperature 98.5 F (36.9 C), temperature source Oral, resp. rate 18, height 5\' 4"  (1.626 m), weight 200 lb 11.2 oz (91.037 kg). Body mass index is 34.43 kg/(m^2). General: Patient is a well appearing female in no acute distress HEENT: PERRLA, sclerae anicteric no conjunctival pallor, MMM Neck: supple, no palpable adenopathy Lungs: clear to auscultation bilaterally, no wheezes, rhonchi, or rales Cardiovascular: regular rate rhythm, S1, S2, no murmurs, rubs or gallops Abdomen: Soft, non-tender, non-distended, normoactive bowel sounds, no HSM Extremities: warm and well perfused, no clubbing, cyanosis, or edema Skin: No rashes or lesions Neuro: Non-focal ECOG PERFORMANCE STATUS: 1 - Symptomatic but completely ambulatory     LABORATORY DATA: Lab Results  Component Value Date  WBC 3.3* 06/14/2013   HGB 12.3 06/14/2013   HCT 36.4 06/14/2013   MCV 95.4 06/14/2013   PLT 42* 06/14/2013      Chemistry      Component Value Date/Time   NA 139 06/10/2013 1121   NA 144 10/28/2012 1248   K 4.0 06/10/2013 1121   K 3.9 10/28/2012 1248   CL 101 01/25/2013 0952   CL 108 10/28/2012 1248   CO2 20* 06/10/2013 1121   CO2 29 09/13/2011 1910   BUN 22.0 06/10/2013 1121   BUN 10 10/28/2012 1248   CREATININE 1.1 06/10/2013 1121   CREATININE 0.80 10/28/2012 1248      Component Value Date/Time   CALCIUM 9.3 06/10/2013 1121   CALCIUM 9.4 09/13/2011 1910   ALKPHOS 110 06/10/2013 1121   ALKPHOS 78 09/13/2011 1910   AST 34 06/10/2013 1121   AST 18 09/13/2011 1910   ALT 42 06/10/2013 1121   ALT 12 09/13/2011 1910   BILITOT 0.62 06/10/2013 1121   BILITOT 0.3 09/13/2011 1910       RADIOGRAPHIC STUDIES: No results found.    ASSESSMENT:   Christine [name] is a 57 y.o. female with:   #1 History of breast cancer  of the right breast status post mastectomy when she was 57 years old. Due to this she was referred for genetic testing.   #2 She recently noticed her left nipple had become itchy and retracted.  She underwent mammogram with ultrasound that showed 2 areas of concern in the left breast (at the 12 o'clock position and 3 o'clock position).  An ultrasound performed that showed the diameter to be at least 6 cm and multiple left axillary lymph nodes that were abnormal. A left breast needle core biopsy performed on 09/30/2012 at the 2 o'clock position showed invasive ductal carcinoma grade 3, estrogen receptor negative, progesterone receptor negative,proliferation marker Ki-67 21%,  HER-2/neu negative.  Left breast needle core biopsy at the 3 o'clock position showed grade 3 ductal carcinoma in situ.  #3  Bilateral breast MRI on 10/12/2012 showed a previous right breast mastectomy and marked diffuse enhancement involving majority of the left breast measuring 13.0 x 12.5 x 9.5 cm. There was diffuse skin thickening and dermal enhancement. Multiple enlarged level I left axillary lymph nodes. The largest lymph node measured 2.4 cm in size. The large area of enhancement within the left breast extends posteriorly to abut the anterior portion of the pectoralis muscle. There was no evidence of internal mammary adenopathy or right axillary adenopathy.   #4. PET/CT did show the left breast mass, and left axillary and subpectoralis nodal metastases, but no further areas throughout the chest/abd/pelvis of metastases.  She then went on to receive a 2-D echocardiogram, Port-A-Cath placement, and attended chemotherapy class.   #5   Patient received 6 cycles of neoadjuvant chemotherapy consisting of FEC (5-FU/epirubicin/Cytoxan) from 11/02/2012 through 01/25/2013 with Neulasta support.  This was followed by 8 cycles of weekly Taxol/Carbo that started from 02/08/2013 through 04/19/2013.  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  She began neoadjuvant chemotherapy consisting of Gemzar/Carboplatin on 05/24/2013 with day 2 Neulasta support.   She will receive 2 cycles of this neoadjuvant chemotherapy prior to definitive surgery.  Left breast mastectomy has been discussed with Dr. Luisa Hart.  #6 dehydration due to chemotherapy  #7 thrombocytopenia secondary to chemotherapy no murmurs  PLAN:  #1 patient has completed all of her neoadjuvant chemotherapy as  of last week.  #2 thrombocytopenia a do to recent chemotherapy we will monitor her she has no bleeding problems. We will get another CBC done in one week's time.  #3 dehydration due to diarrhea we will give her IV fluids today   All questions answered.  Ms. Stice was encouraged to contact us in the interim with any questions, concerns, or problems.  I spent 25 minutes counseling the patient face to face.  The total time spent in the appointment was 30 minutes.   Drue Second, MD Medical/Oncology [facility] [phone] (beeper) [phone] (Office)  06/14/2013, 2:46 PM

## 2013-06-20 ENCOUNTER — Encounter: Payer: Self-pay | Admitting: *Deleted

## 2013-06-22 ENCOUNTER — Other Ambulatory Visit (HOSPITAL_BASED_OUTPATIENT_CLINIC_OR_DEPARTMENT_OTHER): Payer: BC Managed Care – PPO | Admitting: Lab

## 2013-06-22 DIAGNOSIS — C50919 Malignant neoplasm of unspecified site of unspecified female breast: Secondary | ICD-10-CM

## 2013-06-22 DIAGNOSIS — C50912 Malignant neoplasm of unspecified site of left female breast: Secondary | ICD-10-CM

## 2013-06-22 LAB — CBC WITH DIFFERENTIAL/PLATELET
Basophils Absolute: 0 10*3/uL (ref 0.0–0.1)
Eosinophils Absolute: 0 10*3/uL (ref 0.0–0.5)
HCT: 30 % — ABNORMAL LOW (ref 34.8–46.6)
HGB: 10 g/dL — ABNORMAL LOW (ref 11.6–15.9)
LYMPH%: 34.5 % (ref 14.0–49.7)
MCV: 96.1 fL (ref 79.5–101.0)
MONO%: 11.8 % (ref 0.0–14.0)
NEUT#: 2 10*3/uL (ref 1.5–6.5)
NEUT%: 52.9 % (ref 38.4–76.8)
Platelets: 95 10*3/uL — ABNORMAL LOW (ref 145–400)
RBC: 3.12 10*6/uL — ABNORMAL LOW (ref 3.70–5.45)
lymph#: 1.3 10*3/uL (ref 0.9–3.3)

## 2013-06-22 LAB — BASIC METABOLIC PANEL (CC13)
Anion Gap: 15 mEq/L — ABNORMAL HIGH (ref 3–11)
BUN: 9 mg/dL (ref 7.0–26.0)
Calcium: 8.9 mg/dL (ref 8.4–10.4)
Creatinine: 0.8 mg/dL (ref 0.6–1.1)
Glucose: 185 mg/dl — ABNORMAL HIGH (ref 70–140)

## 2013-06-24 ENCOUNTER — Encounter (INDEPENDENT_AMBULATORY_CARE_PROVIDER_SITE_OTHER): Payer: Self-pay | Admitting: Surgery

## 2013-06-24 ENCOUNTER — Ambulatory Visit (INDEPENDENT_AMBULATORY_CARE_PROVIDER_SITE_OTHER): Payer: BC Managed Care – PPO | Admitting: Surgery

## 2013-06-24 VITALS — BP 126/84 | HR 68 | Temp 98.2°F | Resp 14 | Ht 64.0 in | Wt 208.4 lb

## 2013-06-24 DIAGNOSIS — C50912 Malignant neoplasm of unspecified site of left female breast: Secondary | ICD-10-CM

## 2013-06-24 DIAGNOSIS — C50919 Malignant neoplasm of unspecified site of unspecified female breast: Secondary | ICD-10-CM

## 2013-06-24 NOTE — Progress Notes (Signed)
Subjective:     Patient ID: Christine [name], female   DOB: Aug 01, 1956, 57 y.o.   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  PRIOR THERAPY:  1. Patient has history of right breast cancer patient underwent a mastectomy when she was 57 years old. This occurred during her pregnancy. After that patient did not take any kind of further treatments. She continued to do well.  2.Recently she noted changes in her nipple had become itchy and retracted. Because of this she underwent mammogram with ultrasound that showed 2 areas in the left breast one at 12:00 position and a second at 3:00 position. She also had an ultrasound performed that showed the diameter to be at least 6 cm and multiple left axillary lymph nodes that were abnormal. She then went on to have a biopsy performed on 09/30/2012. 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 Patient went on to have MRIs of the breasts performed and was noted to be marked diffuse enhancement involving majority of the left breast measuring 13.0 x 12.5 x 9.5 cm. There was diffuse skin thickening and dermal enhancement. Multiple enlarged level I left axillary lymph nodes. The largest lymph node measured 2.4 cm in size. The large area of enhancement within the left breast extends posteriorly to abut the anterior portion of the pectoralis muscle. There was no evidence of internal mammary adenopathy or right axillary adenopathy. PET/CT did show the left breast mass, and left axillary and subpectoralis nodal metastases, but no further areas throughout the chest/abd/pelvis of metastases.  3. Patient currently undergoing neoadjuvant chemotherapy with  FEC x 6 cycles starting on 11/02/12, which will be followed by 12 cycles of weekly Taxol/Carbo that started on 02/08/13. 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 She will begin Gemzar/Carbo on 05/24/13. She will receive 2 cycles.  CURRENT THERAPY: Gemzar carbo cycle 1 day 1 hopefully to start 05/24/13 She is feeling better. They have not used reports for fear of infection. She has gotten blood products though in the recent past. She is to go back next week to be evaluated for further chemotherapy.  Review of Systems  Constitutional: Positive for fatigue.  Respiratory: Negative.   Cardiovascular: Negative.        Objective:   Physical Exam  Constitutional: She appears well-developed and well-nourished.  Eyes: Pupils are equal, round, and reactive to light. No scleral icterus.  Pulmonary/Chest:    Lymphadenopathy:    She has no axillary adenopathy.       Right axillary: No lateral adenopathy present.       Left axillary: No lateral adenopathy present.      Assessment:     Left breast cancer stage III status post neoadjuvant chemotherapy with significant reduction in size    Plan:     Left MRM and port removal.The surgical and non surgical options have been discussed with the patient.  Risks of surgery include bleeding,  Infection,  Flap necrosis,  Tissue loss,  Chronic pain, death, Numbness,  And the need for additional procedures.  Reconstruction options also have been discussed with the patient as well.  The patient agrees to proceed lymph  node dissection  and dissection has been discussed with the patient.  Risk of bleeding,  Infection,  Seroma formation,  Additional procedures,,  Shoulder weakness ,  Shoulder stiffness,  Nerve and blood vessel injury and reaction to the mapping dyes have been discussed.  Alternatives to surgery have been discussed with the patient.  The patient agrees to proceed.

## 2013-06-24 NOTE — Patient Instructions (Signed)
Total or Modified Radical Mastectomy  Care After Refer to this sheet in the next few weeks. These instructions provide you with information on caring for yourself after your procedure. Your caregiver may also give you more specific instructions. Your treatment has been planned according to current medical practices, but problems sometimes occur. Call your caregiver if you have any problems or questions after your procedure. ACTIVITY  Your caregiver will advise you when you may resume strenuous activities, driving, and sports.  After the drain(s) are removed, you may do light housework. Avoid heavy lifting, carrying, or pushing. You should not be lifting anything heavier than 5 lbs.  Take frequent rest periods. You may tire more easily than usual.  Always rest and elevate the arm affected by your surgery for a period of time equal to your activity time.  Continue doing the exercises given to you by the physical therapist/occupational therapist even after full range of motion has returned. The amount of time this takes will vary from person to person.  After normal range of motion has returned, some stiffness and soreness may persist for 2-3 months. This is normal and will subside.  Begin sports or strenuous activities in moderation. This will give you a chance to rebuild your endurance. Continue to be cautious of heavy lifting or carrying (no more than 10 lbs.) with your affected arm.  You may return to work as recommended by your caregiver. NUTRITION  You may resume your normal diet.  Make sure you drink plenty of fluids (6-8 glasses a day).  Eat a well-balanced diet. Including daily portions of food from government recommended food groups:  Grains.  Vegetables.  Fruits.  Milk.  Meat & beans.  Oils. Visit http://mypyramid.gov/ for more information HYGIENE  You may wash your hair.  If your incision (cut from surgery) is closed, you may shower or tub bathe, unless instructed  otherwise by your doctor. FEVER  If you feel feverish or have shaking chills, take your temperature. If your temperature is 102 F (38.9 C) or above, call your caregiver. The fever may mean there is an infection.  If you call early, infection can be treated with antibiotics and hospitalization may be avoided. PAIN CONTROL  Mild discomfort may occur.  You may need to take an over-the-counter pain medication or a medication prescribed by your caregiver.  Call your caregiver if you experience increased pain. INCISION CARE  Check your incision daily for increased redness, drainage, swelling, or separation of skin.  Call your caregiver if any of the above are noted. ARM AND HAND CARE  If the lymph nodes under your arm were removed with a modified radical mastectomy, there may be a greater tendency for the arm to swell.  Try to avoid having blood pressures taken, blood drawn, or injections given in the affected arm. This is the arm on the same side as the surgery.  Use hand lotion to soften cuticles instead of cutting them to avoid cutting yourself.  Be careful when shaving your under arms. Use an electric shaver if possible. You may use a deodorant after the incision has completely healed. Until then, clean under your arms with hydrogen peroxide.  Use reasonable precaution when cooking, sewing, and gardening to avoid burning or needle or thorn pricks.  Do not weigh your arm straight down with a package or your purse.  Follow the exercises and instructions given to you by the physical therapist/occupational therapist and your caregiver. FOLLOW-UP APPOINTMENT Call your caregiver for a   follow-up appointment as directed. PROSTHESIS INFORMATION Wear your temporary prosthesis (artificial breast) until your caregiver gives you permission to purchase a permanent one. This will depend upon your rate of healing. We suggest you also wait until you are physically and emotionally ready to shop for  one. The suitability depends on several individual factors. We do not endorse any particular prosthesis, but suggest you try several until you are satisfied with appearance and fit. A list of stores may be obtained from your local [employer] at www.cancer.org or 1-800-ACS-2345 (1-800-227-2345). A permanent prosthesis is medically necessary to restore balance. It is also income tax deductible. Be sure all receipts are marked "surgical". It is not essential to purchase a bra. You may sew a pocket into your regular bra. Note: Remember to take all of your medical insurance information with you when shopping for your prosthesis. SELECTING A PROSTHESIS FITTER You may want to ask the following questions when selecting a fitter:  What styles and brands of forms are carried in stock?  How long have the forms been on the market and have there been any problems with them?  Why would one form be better than another?  How long should a particular form last?  May I wear the form for a trial period without obligation?  Do the forms require a prosthetic bra? If so, what is the price range? Must I always wear that style?  If alterations to the bra are necessary, can they be done at this location or be sent out?  Will I be charged for alterations?  Will I receive suggestions on how to alter my own wardrobe, if necessary?  Will you special order forms or bras if necessary?  Are fitters always available to meet my needs?  What kinds of garments should be worn for the fitting?  Are lounge wear, swim wear, and accessories available?  If I have insurance coverage or Medicare, will you suggest ways for processing the paper work?  Do you keep complete records so that mail reordering is possible?  How are warranty claims handled if I have a problem with the form? Document Released: 03/13/2004 Document Revised: 10/13/2011 Document Reviewed: 11/16/2007 Exit[employer] Patient Information 2014  [employer]  

## 2013-07-12 ENCOUNTER — Ambulatory Visit (HOSPITAL_BASED_OUTPATIENT_CLINIC_OR_DEPARTMENT_OTHER): Payer: BC Managed Care – PPO | Admitting: Lab

## 2013-07-12 ENCOUNTER — Telehealth: Payer: Self-pay | Admitting: Oncology

## 2013-07-12 ENCOUNTER — Encounter: Payer: Self-pay | Admitting: Adult Health

## 2013-07-12 ENCOUNTER — Encounter (HOSPITAL_BASED_OUTPATIENT_CLINIC_OR_DEPARTMENT_OTHER): Payer: Self-pay | Admitting: *Deleted

## 2013-07-12 ENCOUNTER — Ambulatory Visit (HOSPITAL_BASED_OUTPATIENT_CLINIC_OR_DEPARTMENT_OTHER): Payer: BC Managed Care – PPO | Admitting: Adult Health

## 2013-07-12 VITALS — BP 121/78 | HR 106 | Temp 98.8°F | Resp 18 | Ht 64.0 in | Wt 209.5 lb

## 2013-07-12 DIAGNOSIS — C773 Secondary and unspecified malignant neoplasm of axilla and upper limb lymph nodes: Secondary | ICD-10-CM

## 2013-07-12 DIAGNOSIS — C50919 Malignant neoplasm of unspecified site of unspecified female breast: Secondary | ICD-10-CM

## 2013-07-12 DIAGNOSIS — Z171 Estrogen receptor negative status [ER-]: Secondary | ICD-10-CM

## 2013-07-12 LAB — CBC WITH DIFFERENTIAL/PLATELET
BASO%: 0.6 % (ref 0.0–2.0)
Basophils Absolute: 0 10*3/uL (ref 0.0–0.1)
EOS%: 0.6 % (ref 0.0–7.0)
LYMPH%: 25.8 % (ref 14.0–49.7)
MCHC: 33.7 g/dL (ref 31.5–36.0)
MCV: 97.5 fL (ref 79.5–101.0)
MONO#: 0.7 10*3/uL (ref 0.1–0.9)
NEUT#: 3.9 10*3/uL (ref 1.5–6.5)
RBC: 3.39 10*6/uL — ABNORMAL LOW (ref 3.70–5.45)
WBC: 6.3 10*3/uL (ref 3.9–10.3)
lymph#: 1.6 10*3/uL (ref 0.9–3.3)

## 2013-07-12 LAB — COMPREHENSIVE METABOLIC PANEL (CC13)
ALT: 17 U/L (ref 0–55)
AST: 28 U/L (ref 5–34)
Anion Gap: 16 mEq/L — ABNORMAL HIGH (ref 3–11)
BUN: 19.5 mg/dL (ref 7.0–26.0)
CO2: 26 mEq/L (ref 22–29)
Calcium: 10.1 mg/dL (ref 8.4–10.4)
Chloride: 101 mEq/L (ref 98–109)
Sodium: 143 mEq/L (ref 136–145)
Total Bilirubin: 0.45 mg/dL (ref 0.20–1.20)
Total Protein: 7.6 g/dL (ref 6.4–8.3)

## 2013-07-12 NOTE — Progress Notes (Signed)
Had her labs done [facility] today- To bring all meds, overnight bag-hx rt mastectomy-

## 2013-07-12 NOTE — Progress Notes (Signed)
07/12/13 1634  OBSTRUCTIVE SLEEP APNEA  Have you ever been diagnosed with sleep apnea through a sleep study? No  Do you snore loudly (loud enough to be heard through closed doors)?  1  Do you often feel tired, fatigued, or sleepy during the daytime? 0  Has anyone observed you stop breathing during your sleep? 0  Do you have, or are you being treated for high blood pressure? 1  BMI more than 35 kg/m2? 1  Age over 57 years old? 1  Neck circumference greater than 40 cm/18 inches? 0  Gender: 0  Obstructive Sleep Apnea Score 4  Score 4 or greater  Results sent to PCP

## 2013-07-12 NOTE — Telephone Encounter (Signed)
, °

## 2013-07-12 NOTE — Progress Notes (Signed)
[facility]  Telephone:([phone]) [phone] Fax:([phone]) [phone]  OFFICE PROGRESS NOTE  ID: [name]   DOB: May 26, 1956  MR#: [id number]  JYN#:[id number]   PCP: [name], MD SU:  [name], M.D.   DIAGNOSIS: Christine [name] is a 57 y.o.  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   PRIOR THERAPY: 1. Patient has history of right breast cancer patient underwent a mastectomy when she was 57 years old. This occurred during her pregnancy. After that patient did not take any kind of further treatments. She continued to do well.   2. She recently noticed her left nipple had become itchy and retracted.  She underwent mammogram with ultrasound that showed 2 areas of concern in the left breast (at the 12 o'clock position and 3 o'clock position).  An ultrasound performed that showed the diameter to be at least 6 cm and multiple left axillary lymph nodes that were abnormal. A left breast needle core biopsy performed on 09/30/2012 at the 2 o'clock position showed invasive ductal carcinoma grade 3, estrogen receptor negative, progesterone receptor negative,proliferation marker Ki-67 21%,  HER-2/neu negative.  Left breast needle core biopsy at the 3 o'clock position showed grade 3 ductal carcinoma in situ.  3.  Bilateral breast MRI on 10/12/2012 showed a previous right breast mastectomy and marked diffuse enhancement involving majority of the left breast measuring 13.0 x 12.5 x 9.5 cm. There was diffuse skin thickening and dermal enhancement. Multiple enlarged level I left axillary lymph nodes. The largest lymph node measured 2.4 cm in size. The large area of enhancement within the left breast extends posteriorly to abut the anterior portion of the pectoralis muscle. There was no evidence of internal  mammary adenopathy or right axillary adenopathy.   4. PET/CT did show the left breast mass, and left axillary and subpectoralis nodal metastases, but no further areas throughout the chest/abd/pelvis of metastases.   5.  Patient received 6 cycles of neoadjuvant chemotherapy consisting of FEC (5-FU/epirubicin/Cytoxan) from 11/02/2012 through 01/25/2013 with Neulasta support.  This was followed by 8 cycles of weekly Taxol/Carbo that started from 02/08/2013 through 04/19/2013.  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  She began neoadjuvant chemotherapy consisting of Gemzar/Carboplatin on 05/24/2013 with day 2 Neulasta support.   Gemzar/carboplatinum completed 06/06/2013 for a total of 2 cycles..  CURRENT THERAPY: surgery scheduled 07/19/13  INTERVAL HISTORY: Christine [name] is a  57 y.o. female who returns for evaluation prior to surgery.  She is doing well today.  She has surgery scheduled on 07/19/13 with Dr. Luisa Hart.  She is feeling well.  Her numbness is much improved.  She has decreased her Gabapentin to 100mg  TID and is tolerating it well.  She has noticed "bumps" on her arms and back at the sites of her hair follicles.  Her vision is improving.  She denies fevers, chills, nausea, vomiting, constipation, diarrhea, or any further concerns.  MEDICAL HISTORY: Past Medical History  Diagnosis Date  . ALLERGIC RHINITIS   . GERD (gastroesophageal reflux disease)   . Hypertension   . Hypothyroidism   . Hx of colonic polyps   . Wears glasses   . Breast cancer 57, age 41    right  . Breast cancer 09/30/12    left, ER/PR -,  Her 2 -    ALLERGIES:   Allergies  Allergen Reactions  . Sulfa Antibiotics Hives, Itching and Swelling  . Sulfonamide Derivatives Hives, Itching and Swelling     MEDICATIONS:  Current Outpatient Prescriptions  Medication Sig Dispense Refill  . diltiazem (CARDIZEM CD) 240 MG 24  hr capsule TAKE ONE CAPSULE BY MOUTH EVERY DAY  90 capsule  3  . gabapentin (NEURONTIN) 300 MG capsule Take 1 capsule (300 mg total) by mouth 3 (three) times daily. 3 month supply  270 capsule  0  . latanoprost (XALATAN) 0.005 % ophthalmic solution Place 1 drop into both eyes at bedtime.       Marland Kitchen levothyroxine (SYNTHROID, LEVOTHROID) 50 MCG tablet TAKE 1 TABLET BY MOUTH EVERY DAY  90 tablet  2  . losartan-hydrochlorothiazide (HYZAAR) 50-12.5 MG per tablet Take 1 tablet by mouth daily.  90 tablet  0  . NEXIUM 40 MG capsule TAKE 1 CAPSULE (40 MG TOTAL) BY MOUTH DAILY BEFORE BREAKFAST.  90 capsule  1  . potassium chloride SA (K-DUR,KLOR-CON) 20 MEQ tablet Take 2 tablets (40 mEq total) by mouth 2 (two) times daily.  120 tablet  0  . fluticasone (FLONASE) 50 MCG/ACT nasal spray Place 2 sprays into the nose daily.  16 g  2   No current facility-administered medications for this visit.    SURGICAL HISTORY:  Past Surgical History  Procedure Laterality Date  . Tonsillectomy    . Caesarean section      x 1  . Mastectomy  04/1987    right side  . Abdominal hysterectomy  06/1988    fibroids  . Portacath placement Right 10/28/2012    Procedure: PORT PLACEMENT;  Surgeon: Clovis Pu. Cornett, MD;  Location: [facility];  Service: General;  Laterality: Right;  Right Subclavian Vein    REVIEW OF SYSTEMS:  A 10 point review of systems was conducted and is otherwise negative except for what is noted above.      PHYSICAL EXAMINATION: Blood pressure 121/78, pulse 106, temperature 98.8 F (37.1 C), temperature source Oral, resp. rate 18, height 5\' 4"  (1.626 m), weight 209 lb 8 oz (95.029 kg). Body mass index is 35.94 kg/(m^2). General: Patient is a well appearing female in no acute distress HEENT: PERRLA, sclerae anicteric no conjunctival pallor, MMM Neck: supple, no palpable adenopathy Lungs: clear to auscultation bilaterally, no wheezes, rhonchi, or rales Cardiovascular: regular rate  rhythm, S1, S2, no murmurs, rubs or gallops Abdomen: Soft, non-tender, non-distended, normoactive bowel sounds, no HSM Extremities: warm and well perfused, no clubbing, cyanosis, or edema Skin: No rashes or lesions Neuro: Non-focal Breasts: left breast mass remains stable.   ECOG PERFORMANCE STATUS: 1 - Symptomatic but completely ambulatory     LABORATORY DATA: Lab Results  Component Value Date   WBC 6.3 07/12/2013   HGB 11.1* 07/12/2013   HCT 33.0* 07/12/2013   MCV 97.5 07/12/2013   PLT 237 07/12/2013      Chemistry      Component Value Date/Time   NA 143 07/12/2013 1600   NA 144 10/28/2012 1248   K 3.2* 07/12/2013 1600   K 3.9 10/28/2012 1248   CL 101 01/25/2013 0952   CL 108 10/28/2012 1248   CO2 26 07/12/2013 1600   CO2 29 09/13/2011 1910   BUN 19.5 07/12/2013 1600   BUN 10 10/28/2012 1248   CREATININE 0.9 07/12/2013 1600   CREATININE 0.80 10/28/2012 1248      Component Value Date/Time  CALCIUM 10.1 07/12/2013 1600   CALCIUM 9.4 09/13/2011 1910   ALKPHOS 101 07/12/2013 1600   ALKPHOS 78 09/13/2011 1910   AST 28 07/12/2013 1600   AST 18 09/13/2011 1910   ALT 17 07/12/2013 1600   ALT 12 09/13/2011 1910   BILITOT 0.45 07/12/2013 1600   BILITOT 0.3 09/13/2011 1910       RADIOGRAPHIC STUDIES: No results found.    ASSESSMENT:   LEAHANN LEMPKE is a 57 y.o. female with:   #1 History of breast cancer of the right breast status post mastectomy when she was 57 years old. Due to this she was referred for genetic testing.   #2 She recently noticed her left nipple had become itchy and retracted.  She underwent mammogram with ultrasound that showed 2 areas of concern in the left breast (at the 12 o'clock position and 3 o'clock position).  An ultrasound performed that showed the diameter to be at least 6 cm and multiple left axillary lymph nodes that were abnormal. A left breast needle core biopsy performed on 09/30/2012 at the 2 o'clock position showed invasive ductal carcinoma grade 3, estrogen  receptor negative, progesterone receptor negative,proliferation marker Ki-67 21%,  HER-2/neu negative.  Left breast needle core biopsy at the 3 o'clock position showed grade 3 ductal carcinoma in situ.  #3  Bilateral breast MRI on 10/12/2012 showed a previous right breast mastectomy and marked diffuse enhancement involving majority of the left breast measuring 13.0 x 12.5 x 9.5 cm. There was diffuse skin thickening and dermal enhancement. Multiple enlarged level I left axillary lymph nodes. The largest lymph node measured 2.4 cm in size. The large area of enhancement within the left breast extends posteriorly to abut the anterior portion of the pectoralis muscle. There was no evidence of internal mammary adenopathy or right axillary adenopathy.   #4. PET/CT did show the left breast mass, and left axillary and subpectoralis nodal metastases, but no further areas throughout the chest/abd/pelvis of metastases.  She then went on to receive a 2-D echocardiogram, Port-A-Cath placement, and attended chemotherapy class.   #5   Patient received 6 cycles of neoadjuvant chemotherapy consisting of FEC (5-FU/epirubicin/Cytoxan) from 11/02/2012 through 01/25/2013 with Neulasta support.  This was followed by 8 cycles of weekly Taxol/Carbo that started from 02/08/2013 through 04/19/2013.  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  She began neoadjuvant chemotherapy consisting of Gemzar/Carboplatin on 05/24/2013 with day 2 Neulasta support.   She received 2 cycles of this.  She will proceed with surgery on 07/19/13.  PLAN:  #1 Mrs. Fredricksen is doing very well today.  She is scheduled for a mastectomy and port removal on 07/19/13.    #2 Her neuropathy is improving, we discussed a plan for her to decrease her neurontin so she can come off of the medication.    #3 The "bumps" on her arms and back is her hair growing back.  I gave the  patient reassurance.    #4 She will return to [place] in one month for labs, evaluation, and discussion of her pathology.   All questions answered.  Ms. Jalomo was encouraged to contact us in the interim with any questions, concerns, or problems.  I spent 25 minutes counseling the patient face to face.  The total time spent in the appointment was 30 minutes.   Illa Level, NP Medical Oncology [facility] [phone] 07/13/2013, 12:40 PM

## 2013-07-13 ENCOUNTER — Ambulatory Visit: Payer: BC Managed Care – PPO

## 2013-07-13 ENCOUNTER — Telehealth: Payer: Self-pay | Admitting: *Deleted

## 2013-07-13 ENCOUNTER — Other Ambulatory Visit: Payer: Self-pay | Admitting: *Deleted

## 2013-07-13 DIAGNOSIS — E876 Hypokalemia: Secondary | ICD-10-CM

## 2013-07-13 MED ORDER — POTASSIUM CHLORIDE CRYS ER 20 MEQ PO TBCR: 20.0000 meq | EXTENDED_RELEASE_TABLET | Freq: Three times a day (TID) | ORAL | Status: DC

## 2013-07-13 NOTE — Telephone Encounter (Signed)
Per NP request. Called patient to give her lab results and to let her know to increase Potassium from 20 mEq 2x a day to 20 mEq 3x a day, starting today. Potassium was 3.2. Pt verbalized understanding. Message forwarded to Larna Daughters, NP.

## 2013-07-19 ENCOUNTER — Ambulatory Visit (HOSPITAL_BASED_OUTPATIENT_CLINIC_OR_DEPARTMENT_OTHER)
Admission: RE | Admit: 2013-07-19 | Discharge: 2013-07-20 | Disposition: A | Discharge status: Home / Self Care | Payer: BC Managed Care – PPO | Source: Ambulatory Visit | Attending: Surgery | Admitting: Surgery

## 2013-07-19 ENCOUNTER — Encounter (HOSPITAL_BASED_OUTPATIENT_CLINIC_OR_DEPARTMENT_OTHER): Payer: BC Managed Care – PPO | Admitting: Anesthesiology

## 2013-07-19 ENCOUNTER — Ambulatory Visit (HOSPITAL_BASED_OUTPATIENT_CLINIC_OR_DEPARTMENT_OTHER): Payer: BC Managed Care – PPO | Admitting: Anesthesiology

## 2013-07-19 ENCOUNTER — Encounter (HOSPITAL_BASED_OUTPATIENT_CLINIC_OR_DEPARTMENT_OTHER): Payer: Self-pay | Admitting: Anesthesiology

## 2013-07-19 ENCOUNTER — Encounter (HOSPITAL_BASED_OUTPATIENT_CLINIC_OR_DEPARTMENT_OTHER)
Admission: RE | Disposition: A | Discharge status: Home / Self Care | Payer: Self-pay | Source: Ambulatory Visit | Attending: Surgery

## 2013-07-19 DIAGNOSIS — Z171 Estrogen receptor negative status [ER-]: Secondary | ICD-10-CM | POA: Insufficient documentation

## 2013-07-19 DIAGNOSIS — Z853 Personal history of malignant neoplasm of breast: Secondary | ICD-10-CM

## 2013-07-19 DIAGNOSIS — C773 Secondary and unspecified malignant neoplasm of axilla and upper limb lymph nodes: Secondary | ICD-10-CM | POA: Insufficient documentation

## 2013-07-19 DIAGNOSIS — C50919 Malignant neoplasm of unspecified site of unspecified female breast: Secondary | ICD-10-CM

## 2013-07-19 DIAGNOSIS — I10 Essential (primary) hypertension: Secondary | ICD-10-CM | POA: Insufficient documentation

## 2013-07-19 DIAGNOSIS — Z79899 Other long term (current) drug therapy: Secondary | ICD-10-CM | POA: Insufficient documentation

## 2013-07-19 DIAGNOSIS — Z452 Encounter for adjustment and management of vascular access device: Secondary | ICD-10-CM

## 2013-07-19 HISTORY — PX: BREAST SURGERY: SHX581

## 2013-07-19 HISTORY — PX: MASTECTOMY MODIFIED RADICAL: SHX5962

## 2013-07-19 HISTORY — PX: PORT-A-CATH REMOVAL: SHX5289

## 2013-07-19 SURGERY — MASTECTOMY, MODIFIED RADICAL
Anesthesia: General | Site: Chest | Laterality: Right

## 2013-07-19 MED ORDER — ONDANSETRON HCL 4 MG/2ML IJ SOLN: 4.0000 mg | Freq: Four times a day (QID) | INTRAMUSCULAR | Status: DC | PRN

## 2013-07-19 MED ORDER — SODIUM CHLORIDE 0.9 % IV SOLN: 250.0000 mL | INTRAVENOUS | Status: DC | PRN

## 2013-07-19 MED ORDER — CEFAZOLIN SODIUM 10 G IJ SOLR: 3.0000 g | INTRAMUSCULAR | Status: DC

## 2013-07-19 MED ORDER — MORPHINE SULFATE 2 MG/ML IJ SOLN: 2.0000 mg | INTRAMUSCULAR | Status: DC | PRN

## 2013-07-19 MED ORDER — MIDAZOLAM HCL 5 MG/5ML IJ SOLN
INTRAMUSCULAR | Status: DC | PRN
  Administered 2013-07-19: 2 mg via INTRAVENOUS

## 2013-07-19 MED ORDER — SODIUM CHLORIDE 0.9 % IJ SOLN: 3.0000 mL | INTRAMUSCULAR | Status: DC | PRN

## 2013-07-19 MED ORDER — ACETAMINOPHEN 325 MG PO TABS: 650.0000 mg | ORAL_TABLET | ORAL | Status: DC | PRN

## 2013-07-19 MED ORDER — GABAPENTIN 300 MG PO CAPS: 300.0000 mg | ORAL_CAPSULE | Freq: Three times a day (TID) | ORAL | Status: DC

## 2013-07-19 MED ORDER — PANTOPRAZOLE SODIUM 40 MG PO TBEC: 40.0000 mg | DELAYED_RELEASE_TABLET | Freq: Every day | ORAL | Status: DC

## 2013-07-19 MED ORDER — DEXTROSE 5 % IV SOLN
3.0000 g | INTRAVENOUS | Status: AC
  Administered 2013-07-19: 2 g via INTRAVENOUS

## 2013-07-19 MED ORDER — LATANOPROST 0.005 % OP SOLN
1.0000 [drp] | Freq: Every day | OPHTHALMIC | Status: DC
  Administered 2013-07-19: 1 [drp] via OPHTHALMIC

## 2013-07-19 MED ORDER — SODIUM CHLORIDE 0.9 % IJ SOLN: 3.0000 mL | Freq: Two times a day (BID) | INTRAMUSCULAR | Status: DC

## 2013-07-19 MED ORDER — FENTANYL CITRATE 0.05 MG/ML IJ SOLN
INTRAMUSCULAR | Status: DC | PRN
  Administered 2013-07-19 (×2): 50 ug via INTRAVENOUS
  Administered 2013-07-19: 100 ug via INTRAVENOUS
  Administered 2013-07-19: 50 ug via INTRAVENOUS

## 2013-07-19 MED ORDER — MIDAZOLAM HCL 2 MG/2ML IJ SOLN
INTRAMUSCULAR | Status: AC
  Filled 2013-07-19: qty 2

## 2013-07-19 MED ORDER — LIDOCAINE HCL (CARDIAC) 20 MG/ML IV SOLN
INTRAVENOUS | Status: DC | PRN
  Administered 2013-07-19: 50 mg via INTRAVENOUS

## 2013-07-19 MED ORDER — DILTIAZEM HCL ER COATED BEADS 120 MG PO CP24: 120.0000 mg | ORAL_CAPSULE | Freq: Every day | ORAL | Status: DC

## 2013-07-19 MED ORDER — OXYCODONE HCL 5 MG PO TABS: 5.0000 mg | ORAL_TABLET | Freq: Once | ORAL | Status: AC | PRN

## 2013-07-19 MED ORDER — CEFAZOLIN SODIUM 1-5 GM-% IV SOLN
INTRAVENOUS | Status: AC
  Filled 2013-07-19: qty 100

## 2013-07-19 MED ORDER — DEXTROSE-NACL 5-0.45 % IV SOLN
INTRAVENOUS | Status: DC
  Administered 2013-07-19: 16:00:00 via INTRAVENOUS

## 2013-07-19 MED ORDER — OXYCODONE HCL 5 MG PO TABS
ORAL_TABLET | ORAL | Status: AC
  Filled 2013-07-19: qty 2

## 2013-07-19 MED ORDER — PROPOFOL 10 MG/ML IV BOLUS
INTRAVENOUS | Status: DC | PRN
  Administered 2013-07-19: 300 mg via INTRAVENOUS

## 2013-07-19 MED ORDER — LOSARTAN POTASSIUM-HCTZ 50-12.5 MG PO TABS: 1.0000 | ORAL_TABLET | Freq: Every day | ORAL | Status: DC

## 2013-07-19 MED ORDER — ONDANSETRON HCL 4 MG/2ML IJ SOLN: 4.0000 mg | Freq: Once | INTRAMUSCULAR | Status: AC | PRN

## 2013-07-19 MED ORDER — HYDROMORPHONE HCL PF 1 MG/ML IJ SOLN
0.2500 mg | INTRAMUSCULAR | Status: DC | PRN
  Administered 2013-07-19: 0.5 mg via INTRAVENOUS

## 2013-07-19 MED ORDER — FENTANYL CITRATE 0.05 MG/ML IJ SOLN
INTRAMUSCULAR | Status: AC
  Filled 2013-07-19: qty 6

## 2013-07-19 MED ORDER — LACTATED RINGERS IV SOLN
INTRAVENOUS | Status: DC
  Administered 2013-07-19 (×2): via INTRAVENOUS

## 2013-07-19 MED ORDER — OXYCODONE HCL 5 MG/5ML PO SOLN: 5.0000 mg | Freq: Once | ORAL | Status: AC | PRN

## 2013-07-19 MED ORDER — OXYCODONE HCL 5 MG PO TABS
5.0000 mg | ORAL_TABLET | ORAL | Status: DC | PRN
  Administered 2013-07-19: 10 mg via ORAL
  Administered 2013-07-19: 5 mg via ORAL
  Administered 2013-07-20: 10 mg via ORAL

## 2013-07-19 MED ORDER — DEXAMETHASONE SODIUM PHOSPHATE 4 MG/ML IJ SOLN
INTRAMUSCULAR | Status: DC | PRN
  Administered 2013-07-19: 10 mg via INTRAVENOUS

## 2013-07-19 MED ORDER — HYDROMORPHONE HCL PF 1 MG/ML IJ SOLN
INTRAMUSCULAR | Status: AC
  Filled 2013-07-19: qty 1

## 2013-07-19 MED ORDER — ACETAMINOPHEN 650 MG RE SUPP: 650.0000 mg | RECTAL | Status: DC | PRN

## 2013-07-19 MED ORDER — ONDANSETRON HCL 4 MG/2ML IJ SOLN
INTRAMUSCULAR | Status: DC | PRN
  Administered 2013-07-19: 4 mg via INTRAVENOUS

## 2013-07-19 MED ORDER — CHLORHEXIDINE GLUCONATE 4 % EX LIQD: 1.0000 "application " | Freq: Once | CUTANEOUS | Status: DC

## 2013-07-19 SURGICAL SUPPLY — 75 items
ADH SKN CLS APL DERMABOND .7 (GAUZE/BANDAGES/DRESSINGS) ×2
APL SKNCLS STERI-STRIP NONHPOA (GAUZE/BANDAGES/DRESSINGS)
APPLIER CLIP 11 MED OPEN (CLIP) ×3
APPLIER CLIP 9.375 MED OPEN (MISCELLANEOUS) ×3
APR CLP MED 11 20 MLT OPN (CLIP) ×2
APR CLP MED 9.3 20 MLT OPN (MISCELLANEOUS) ×2
BANDAGE ELASTIC 6 VELCRO ST LF (GAUZE/BANDAGES/DRESSINGS) ×2 IMPLANT
BENZOIN TINCTURE PRP APPL 2/3 (GAUZE/BANDAGES/DRESSINGS) IMPLANT
BINDER BREAST LRG (GAUZE/BANDAGES/DRESSINGS) IMPLANT
BINDER BREAST MEDIUM (GAUZE/BANDAGES/DRESSINGS) IMPLANT
BINDER BREAST XLRG (GAUZE/BANDAGES/DRESSINGS) IMPLANT
BINDER BREAST XXLRG (GAUZE/BANDAGES/DRESSINGS) IMPLANT
BIOPATCH RED 1 DISK 7.0 (GAUZE/BANDAGES/DRESSINGS) ×2 IMPLANT
BLADE HEX COATED 2.75 (ELECTRODE) ×3 IMPLANT
BLADE SURG 10 STRL SS (BLADE) ×3 IMPLANT
BLADE SURG 15 STRL LF DISP TIS (BLADE) ×2 IMPLANT
BLADE SURG 15 STRL SS (BLADE) ×3
BNDG GAUZE ELAST 4 BULKY (GAUZE/BANDAGES/DRESSINGS) ×1 IMPLANT
CANISTER SUCT 1200ML W/VALVE (MISCELLANEOUS) ×3 IMPLANT
CHLORAPREP W/TINT 26ML (MISCELLANEOUS) ×3 IMPLANT
CLIP APPLIE 11 MED OPEN (CLIP) IMPLANT
CLIP APPLIE 9.375 MED OPEN (MISCELLANEOUS) IMPLANT
COVER MAYO STAND STRL (DRAPES) ×3 IMPLANT
COVER PROBE W GEL 5X96 (DRAPES) ×2 IMPLANT
COVER TABLE BACK 60X90 (DRAPES) ×3 IMPLANT
DECANTER SPIKE VIAL GLASS SM (MISCELLANEOUS) ×3 IMPLANT
DERMABOND ADVANCED (GAUZE/BANDAGES/DRESSINGS) ×1
DERMABOND ADVANCED .7 DNX12 (GAUZE/BANDAGES/DRESSINGS) ×4 IMPLANT
DEVICE DUBIN W/COMP PLATE 8390 (MISCELLANEOUS) ×2 IMPLANT
DRAIN CHANNEL 19F RND (DRAIN) ×4 IMPLANT
DRAPE LAPAROSCOPIC ABDOMINAL (DRAPES) ×3 IMPLANT
DRAPE PED LAPAROTOMY (DRAPES) ×3 IMPLANT
DRAPE UTILITY XL STRL (DRAPES) ×3 IMPLANT
DRSG TEGADERM 4X10 (GAUZE/BANDAGES/DRESSINGS) ×2 IMPLANT
ELECT REM PT RETURN 9FT ADLT (ELECTROSURGICAL) ×3
ELECTRODE REM PT RTRN 9FT ADLT (ELECTROSURGICAL) ×2 IMPLANT
EVACUATOR SILICONE 100CC (DRAIN) ×4 IMPLANT
GLOVE BIO SURGEON STRL SZ 6.5 (GLOVE) ×1 IMPLANT
GLOVE BIOGEL PI IND STRL 7.0 (GLOVE) IMPLANT
GLOVE BIOGEL PI IND STRL 8 (GLOVE) ×2 IMPLANT
GLOVE BIOGEL PI INDICATOR 7.0 (GLOVE) ×1
GLOVE BIOGEL PI INDICATOR 8 (GLOVE) ×1
GLOVE ECLIPSE 8.0 STRL XLNG CF (GLOVE) ×3 IMPLANT
GOWN PREVENTION PLUS XLARGE (GOWN DISPOSABLE) ×6 IMPLANT
NDL HYPO 25X1 1.5 SAFETY (NEEDLE) ×4 IMPLANT
NDL SAFETY ECLIPSE 18X1.5 (NEEDLE) ×2 IMPLANT
NEEDLE HYPO 18GX1.5 SHARP (NEEDLE)
NEEDLE HYPO 25X1 1.5 SAFETY (NEEDLE) ×3 IMPLANT
NS IRRIG 1000ML POUR BTL (IV SOLUTION) ×3 IMPLANT
PACK BASIN DAY SURGERY FS (CUSTOM PROCEDURE TRAY) ×3 IMPLANT
PAD ABD 8X10 STRL (GAUZE/BANDAGES/DRESSINGS) IMPLANT
PENCIL BUTTON HOLSTER BLD 10FT (ELECTRODE) ×3 IMPLANT
PIN SAFETY STERILE (MISCELLANEOUS) ×3 IMPLANT
SLEEVE SCD COMPRESS KNEE MED (MISCELLANEOUS) ×3 IMPLANT
SPONGE GAUZE 4X4 12PLY STER LF (GAUZE/BANDAGES/DRESSINGS) IMPLANT
SPONGE LAP 18X18 X RAY DECT (DISPOSABLE) ×5 IMPLANT
SPONGE LAP 4X18 X RAY DECT (DISPOSABLE) ×3 IMPLANT
STAPLER VISISTAT 35W (STAPLE) ×3 IMPLANT
STRIP CLOSURE SKIN 1/2X4 (GAUZE/BANDAGES/DRESSINGS) IMPLANT
SUT CHROMIC 3 0 SH 27 (SUTURE) IMPLANT
SUT ETHILON 2 0 FS 18 (SUTURE) ×3 IMPLANT
SUT MNCRL AB 3-0 PS2 18 (SUTURE) ×3 IMPLANT
SUT MON AB 4-0 PC3 18 (SUTURE) ×3 IMPLANT
SUT SILK 2 0 FS (SUTURE) ×3 IMPLANT
SUT SILK 2 0 SH (SUTURE) IMPLANT
SUT VIC AB 3-0 54X BRD REEL (SUTURE) ×2 IMPLANT
SUT VIC AB 3-0 BRD 54 (SUTURE) ×3
SUT VIC AB 3-0 SH 27 (SUTURE)
SUT VIC AB 3-0 SH 27X BRD (SUTURE) IMPLANT
SUT VICRYL 3-0 CR8 SH (SUTURE) ×6 IMPLANT
SYR CONTROL 10ML LL (SYRINGE) ×6 IMPLANT
TOWEL OR 17X24 6PK STRL BLUE (TOWEL DISPOSABLE) ×6 IMPLANT
TOWEL OR NON WOVEN STRL DISP B (DISPOSABLE) ×3 IMPLANT
TUBE CONNECTING 20X1/4 (TUBING) ×3 IMPLANT
YANKAUER SUCT BULB TIP NO VENT (SUCTIONS) ×3 IMPLANT

## 2013-07-19 NOTE — Op Note (Signed)
Left Modified Radical Mastectomy Procedure Note with port removal  Indications: This patient presents for surgical treatment of Breast Cancer.She has completed neoadjuvant chemotherapy  With good response.  She is here for MRM and port removal. The surgical and non surgical options have been discussed with the patient.  Risks of surgery include bleeding,  Infection,  Flap necrosis,  Tissue loss,  Chronic pain, death, Numbness,  And the need for additional procedures.  Reconstruction options also have been discussed with the patient as well.  The patient agrees to proceed.  Pre-operative Diagnosis: left breast cancer  Post-operative Diagnosis: left breast cancer  Surgeon: [name] A.   Assistants: OR staff  Anesthesia: General endotracheal anesthesia  ASA Class: 3  Procedure Details  The patient was seen again in the Holding Room. The risks, benefits, indications, potential complications, treatment options, and expected outcomes were discussed with the patient. The possibilities of reaction to medication, pulmonary aspiration, bleeding, recurrent infection, the need for additional procedures, failure to diagnose a condition, and creating a complication requiring transfusion or further operation were discussed with the patient. The patient and/or family concurred with the proposed plan, giving informed consent. The site of surgery was properly noted/marked. The patient was taken to the Operating Room, identified as Christine [name] and the procedure verified as left modified radical mastectomy. A Time Out was held and the above information confirmed.  The patient was placed supine and general anesthetic was administered. The left arm, left breast, and chest were prepped and draped in standard fashion. The right sided port was removed first.  Incision made over the port site.  The port was removed in its entirety.  The wound was closed with 3 O vicryl and 4 O monocryl.  Dermabond applied.   An  oblique elliptical incision was made encompassing the nipple. Skin flaps were created meticulously to preserve the subdermal blood supply and maintain a clear margin of resection around the tumor. Dissection was carried down to the pectoralis fascia, which was included with the specimen, and an axillary dissection was performed in continuity with the mastectomy. This included levels I and II. This was accomplished by exposing the axillary vein anteriorly and inferiorly to the level of the pectoralis minor and laterally. Small venous tributaries, lymphatics, and vessels were clipped and ligated or cauterized and divided. The subscapularis muscle was skeletonized. The long thoracic and thoracodorsal neurovascular bundles were identified and preserved.    The specimen was submitted to pathology. The wound was irrigated. Two J-P drains were placed. One was placed within the lower flap and second was placed laterally in the axillary dissection region. The skin incision was closed in layers with a 3-0 Vicryl subcuticular closure. The drains were secured with 2-0 ethibond sutures. Dermabond  Was  applied along with a dry bulky gauze dressing.  Instrument, sponge, and needle counts were correct at closure and at the conclusion of the case.   Findings: see above  Estimated Blood Loss: less than 100 mL         Drains: Two JP drains placed as above         Total IV Fluids: 1200 mL         Specimens: Breast with axillary contents   Complications: None; patient tolerated the procedure well.         Disposition: PACU - hemodynamically stable.         Condition: stable

## 2013-07-19 NOTE — H&P (View-Only) (Signed)
Subjective:     Patient ID: Christine [name], female   DOB: 10/28/1955, 57 y.o.   MRN: [id number]  HPI -year-old female with new diagnosis of invasive mammary carcinoma, ER negative, PR negative, HER-2/neu negative of the left breast diagnosed 09/30/2012.  PRIOR THERAPY:  1. Patient has history of right breast cancer patient underwent a mastectomy when she was 57 years old. This occurred during her pregnancy. After that patient did not take any kind of further treatments. She continued to do well.  2.Recently she noted changes in her nipple had become itchy and retracted. Because of this she underwent mammogram with ultrasound that showed 2 areas in the left breast one at 12:00 position and a second at 3:00 position. She also had an ultrasound performed that showed the diameter to be at least 6 cm and multiple left axillary lymph nodes that were abnormal. She then went on to have a biopsy performed on 09/30/2012. 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 Patient went on to have MRIs of the breasts performed and was noted to be marked diffuse enhancement involving majority of the left breast measuring 13.0 x 12.5 x 9.5 cm. There was diffuse skin thickening and dermal enhancement. Multiple enlarged level I left axillary lymph nodes. The largest lymph node measured 2.4 cm in size. The large area of enhancement within the left breast extends posteriorly to abut the anterior portion of the pectoralis muscle. There was no evidence of internal mammary adenopathy or right axillary adenopathy. PET/CT did show the left breast mass, and left axillary and subpectoralis nodal metastases, but no further areas throughout the chest/abd/pelvis of metastases.  3. Patient currently undergoing neoadjuvant chemotherapy with  FEC x 6 cycles starting on 11/02/12, which will be followed by 12 cycles of weekly Taxol/Carbo that started on 02/08/13. 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 She will begin Gemzar/Carbo on 05/24/13. She will receive 2 cycles.  CURRENT THERAPY: Gemzar carbo cycle 1 day 1 hopefully to start 05/24/13 She is feeling better. They have not used reports for fear of infection. She has gotten blood products though in the recent past. She is to go back next week to be evaluated for further chemotherapy.  Review of Systems  Constitutional: Positive for fatigue.  Respiratory: Negative.   Cardiovascular: Negative.        Objective:   Physical Exam  Constitutional: She appears well-developed and well-nourished.  Eyes: Pupils are equal, round, and reactive to light. No scleral icterus.  Pulmonary/Chest:    Lymphadenopathy:    She has no axillary adenopathy.       Right axillary: No lateral adenopathy present.       Left axillary: No lateral adenopathy present.      Assessment:     Left breast cancer stage III status post neoadjuvant chemotherapy with significant reduction in size    Plan:     Left MRM and port removal.The surgical and non surgical options have been discussed with the patient.  Risks of surgery include bleeding,  Infection,  Flap necrosis,  Tissue loss,  Chronic pain, death, Numbness,  And the need for additional procedures.  Reconstruction options also have been discussed with the patient as well.  The patient agrees to proceed lymph   node dissection  and dissection has been discussed with the patient.  Risk of bleeding,  Infection,  Seroma formation,  Additional procedures,,  Shoulder weakness ,  Shoulder stiffness,  Nerve and blood vessel injury and reaction to the mapping dyes have been discussed.  Alternatives to surgery have been discussed with the patient.  The patient agrees to proceed.      

## 2013-07-19 NOTE — Anesthesia Preprocedure Evaluation (Signed)
Anesthesia Evaluation  Patient identified by MRN, date of birth, ID band Patient awake    Reviewed: Allergy & Precautions, H&P , NPO status , Patient's Chart, lab work & pertinent test results  Airway Mallampati: I TM Distance: >3 FB Neck ROM: Full    Dental  (+) Teeth Intact and Dental Advisory Given   Pulmonary          Cardiovascular hypertension, Pt. on medications Rhythm:Regular Rate:Normal     Neuro/Psych    GI/Hepatic   Endo/Other  Morbid obesity  Renal/GU      Musculoskeletal   Abdominal   Peds  Hematology   Anesthesia Other Findings   Reproductive/Obstetrics                           Anesthesia Physical Anesthesia Plan  ASA: II  Anesthesia Plan: General   Post-op Pain Management:    Induction: Intravenous  Airway Management Planned: LMA  Additional Equipment:   Intra-op Plan:   Post-operative Plan: Extubation in OR  Informed Consent: I have reviewed the patients History and Physical, chart, labs and discussed the procedure including the risks, benefits and alternatives for the proposed anesthesia with the patient or authorized representative who has indicated his/her understanding and acceptance.   Dental advisory given  Plan Discussed with: CRNA, Anesthesiologist and Surgeon  Anesthesia Plan Comments:         Anesthesia Quick Evaluation

## 2013-07-19 NOTE — Transfer of Care (Signed)
Immediate Anesthesia Transfer of Care Note  Patient: Christine [name]  Procedure(s) Performed: Procedure(s): MASTECTOMY MODIFIED RADICAL (Left) REMOVAL PORT-A-CATH (Right)  Patient Location: PACU  Anesthesia Type:General  Level of Consciousness: awake  Airway & Oxygen Therapy: Patient Spontanous Breathing and Patient connected to face mask oxygen  Post-op Assessment: Report given to PACU RN and Post -op Vital signs reviewed and stable  Post vital signs: Reviewed and stable  Complications: No apparent anesthesia complications

## 2013-07-19 NOTE — Anesthesia Procedure Notes (Signed)
Procedure Name: LMA Insertion Date/Time: 07/19/2013 11:40 AM Performed by: Caren Macadam Pre-anesthesia Checklist: Patient identified, Emergency Drugs available, Suction available and Patient being monitored Patient Re-evaluated:Patient Re-evaluated prior to inductionOxygen Delivery Method: Circle System Utilized Preoxygenation: Pre-oxygenation with 100% oxygen Intubation Type: IV induction Ventilation: Mask ventilation without difficulty LMA: LMA inserted LMA Size: 4.0 Number of attempts: 1 Airway Equipment and Method: bite block Placement Confirmation: positive ETCO2 and breath sounds checked- equal and bilateral Tube secured with: Tape Dental Injury: Teeth and Oropharynx as per pre-operative assessment

## 2013-07-19 NOTE — Anesthesia Postprocedure Evaluation (Signed)
  Anesthesia Post-op Note  Patient: Christine [name]  Procedure(s) Performed: Procedure(s): MASTECTOMY MODIFIED RADICAL (Left) REMOVAL PORT-A-CATH (Right)  Patient Location: PACU  Anesthesia Type:General  Level of Consciousness: awake, alert  and oriented  Airway and Oxygen Therapy: Patient Spontanous Breathing and Patient connected to face mask oxygen  Post-op Pain: mild  Post-op Assessment: Post-op Vital signs reviewed  Post-op Vital Signs: Reviewed  Complications: No apparent anesthesia complications

## 2013-07-19 NOTE — Interval H&P Note (Signed)
History and Physical Interval Note:  07/19/2013 10:57 AM  Christine [name]  has presented today for surgery, with the diagnosis of left breast cancer  The various methods of treatment have been discussed with the patient and family. After consideration of risks, benefits and other options for treatment, the patient has consented to  Procedure(s): MASTECTOMY MODIFIED RADICAL (Left) REMOVAL PORT-A-CATH (N/A) as a surgical intervention .  The patient's history has been reviewed, patient examined, no change in status, stable for surgery.  I have reviewed the patient's chart and labs.  Questions were answered to the patient's satisfaction.     Jennine Peddy A.

## 2013-07-20 MED ORDER — OXYCODONE HCL 5 MG PO TABS
ORAL_TABLET | ORAL | Status: AC
  Filled 2013-07-20: qty 2

## 2013-07-20 MED ORDER — OXYCODONE-ACETAMINOPHEN 5-325 MG PO TABS: 1.0000 | ORAL_TABLET | ORAL | Status: DC | PRN

## 2013-07-20 NOTE — Discharge Summary (Signed)
Physician Discharge Summary  Patient ID: BREELEY BISCHOF MRN: [id number] DOB/AGE: 08/10/1955 57 y.o.  Admit date: 07/19/2013 Discharge date: 07/20/2013  Admission Diagnoses:left breast cancer  Discharge Diagnoses: same Active Problems:   * No active hospital problems. *   Discharged Condition: good  Hospital Course: unremarkable. Did well. No issues.   Consults: None  Significant Diagnostic Studies: none  Treatments: surgery: LMRM  Discharge Exam: Blood pressure 127/74, pulse 98, temperature 98.1 F (36.7 C), temperature source Oral, resp. rate 18, height 5\' 4"  (1.626 m), weight 211 lb (95.709 kg), SpO2 97.00%. Incision/Wound:C/D/I  Flaps viable no seroma  Disposition: 01-Home or Self Care  Discharge Orders   Future Appointments Provider Department Dept Phone   08/01/2013 10:00 AM [facility] Nurse [facility] [phone]   08/18/2013 1:30 PM [facility] [phone]   08/18/2013 2:00 PM Victorino December, MD [facility] [phone]   Future Orders Complete By Expires   Diet - low sodium heart healthy  As directed    Increase activity slowly  As directed        Medication List         diltiazem 240 MG 24 hr capsule  Commonly known as:  CARDIZEM CD  TAKE ONE CAPSULE BY MOUTH EVERY DAY     fluticasone 50 MCG/ACT nasal spray  Commonly known as:  FLONASE  Place 2 sprays into the nose daily.     gabapentin 300 MG capsule  Commonly known as:  NEURONTIN  Take 1 capsule (300 mg total) by mouth 3 (three) times daily. 3 month supply     latanoprost 0.005 % ophthalmic solution  Commonly known as:  XALATAN  Place 1 drop into both eyes at bedtime.     levothyroxine 50 MCG tablet  Commonly known as:  SYNTHROID, LEVOTHROID  TAKE 1 TABLET BY MOUTH EVERY DAY     losartan-hydrochlorothiazide 50-12.5 MG per tablet  Commonly known as:  HYZAAR  Take 1 tablet  by mouth daily.     NEXIUM 40 MG capsule  Generic drug:  esomeprazole  TAKE 1 CAPSULE (40 MG TOTAL) BY MOUTH DAILY BEFORE BREAKFAST.     oxyCODONE-acetaminophen 5-325 MG per tablet  Commonly known as:  ROXICET  Take 1 tablet by mouth every 4 (four) hours as needed.     potassium chloride SA 20 MEQ tablet  Commonly known as:  K-DUR,KLOR-CON  Take 1 tablet (20 mEq total) by mouth 3 (three) times daily.           Follow-up Information   Follow up with Tahlor Berenguer A., MD In 7 days.   Specialty:  General Surgery   Contact information:   [address] [place] [address] [phone]       Signed: Dortha Schwalbe. 07/20/2013, 7:41 AM

## 2013-07-21 ENCOUNTER — Encounter (HOSPITAL_BASED_OUTPATIENT_CLINIC_OR_DEPARTMENT_OTHER): Payer: Self-pay | Admitting: Surgery

## 2013-07-22 ENCOUNTER — Telehealth (INDEPENDENT_AMBULATORY_CARE_PROVIDER_SITE_OTHER): Payer: Self-pay

## 2013-07-22 NOTE — Telephone Encounter (Signed)
Message copied by Brennan Bailey on Fri Jul 22, 2013 11:45 AM ------      Message from: [name] A      Created: Thu Jul 21, 2013 10:28 PM       Margins negative some cancer left but smaller.  Some cancer in nodes but less than before ------

## 2013-07-22 NOTE — Telephone Encounter (Signed)
LMOM> please give below message once she calls back.

## 2013-07-26 ENCOUNTER — Ambulatory Visit (INDEPENDENT_AMBULATORY_CARE_PROVIDER_SITE_OTHER): Payer: BC Managed Care – PPO | Admitting: Surgery

## 2013-07-26 ENCOUNTER — Encounter (INDEPENDENT_AMBULATORY_CARE_PROVIDER_SITE_OTHER): Payer: Self-pay | Admitting: Surgery

## 2013-07-26 VITALS — BP 124/86 | HR 62 | Temp 97.7°F | Resp 16 | Ht 64.0 in | Wt 201.8 lb

## 2013-07-26 DIAGNOSIS — Z9889 Other specified postprocedural states: Secondary | ICD-10-CM

## 2013-07-26 NOTE — Patient Instructions (Signed)
Cont drain recording.  Return in 10 - 14 days for drain removal.

## 2013-07-26 NOTE — Progress Notes (Signed)
Patient returns after left modified radical mastectomy after neoadjuvant chemotherapy. She still had tumor within the mastectomy specimen lymph nodes. This seemed to be smaller than when she started. She is still having fair amount of drainage of both of her drains but the axillary drain is less than 30 cc a day.  Exam: Left mastectomy wound clean dry and intact. JP drainage from the axillary drain was less than 30 cc was removed today. Breast, modified radical mastectomy , Left - INVASIVE GRADE III DUCTAL CARCNIOMA, SPANNING 3.9 CM IN GREATEST DIMENSION. - NEOADJUVANT CHANGES ARE PRESENT IN BREAST PARENCHYMA. - MARGINS ARE NEGATIVE. - NINE OF THIRTEEN LYMPH NODES ARE POSITIVE FOR METASTATIC DUCTAL CARCINOMA (9/13). - EXTRACAPSULAR EXTENSION IS PRESENT. - SEE ONCOLOGY TEMPLATE AND COMMENT. Microscopic Comment BREAST, INVASIVE TUMOR, WITH LYMPH NODE SAMPLING Specimen, including laterality and lymph node sampling (sentinel, non-sentinel): Left breast with axillary contents. Procedure: Left modified radical mastectomy. Histologic type: Ductal carcinoma. Grade: III, see below comment. Tubule formation: 3. Nuclear pleomorphism: 3. Mitotic: 2. Tumor size (gross measurement): 3.9 cm. Margins: Invasive, distance to closest margin: 0.8 cm (deep margin). In-situ, distance to closest margin: N/A. Lymphovascular invasion: Although definitive lymph/vascular invasion is not identified, multiple lymph nodes are positive for metastatic ductal carcinoma, see below. Ductal carcinoma in situ: Not identified. Lobular neoplasia: Not identified. Tumor focality: Unifocal. Treatment effect: Yes, neoadjuvant effect is present within breast parenchyma. Lymph nodes also demonstrate some degree of neoadjuvant change. However, tumor is present within both the breast and lymph nodes. Extent of tumor: Skin: Not involved. Nipple: Not involved. 1 of 3 FINAL for Kloeppel, Golda W [phone])  Impression: Status  post left modified radical mastectomy after neoadjuvant chemotherapy for locally aggressive left breast cancer  Plan: Return to clinic in 10-14 days for  final drain. She is to follow up with oncology next month.

## 2013-08-05 ENCOUNTER — Other Ambulatory Visit: Payer: Self-pay | Admitting: Internal Medicine

## 2013-08-12 ENCOUNTER — Encounter (INDEPENDENT_AMBULATORY_CARE_PROVIDER_SITE_OTHER): Payer: Self-pay | Admitting: Surgery

## 2013-08-12 ENCOUNTER — Ambulatory Visit (INDEPENDENT_AMBULATORY_CARE_PROVIDER_SITE_OTHER): Payer: BC Managed Care – PPO | Admitting: Surgery

## 2013-08-12 VITALS — BP 130/76 | HR 90 | Temp 98.0°F | Resp 18 | Ht 64.0 in | Wt 201.0 lb

## 2013-08-12 DIAGNOSIS — Z9889 Other specified postprocedural states: Secondary | ICD-10-CM

## 2013-08-12 NOTE — Patient Instructions (Signed)
Status: Signed        Patient returns after left modified radical mastectomy after neoadjuvant chemotherapy. She still had tumor within the mastectomy specimen lymph nodes. This seemed to be smaller than when she started. She is still having fair amount of drainage of both of her drains but the axillary drain is less than 30 cc a day.  Exam: Left mastectomy wound clean dry and intact. JP  Removed.    Breast, modified radical mastectomy , Left  - INVASIVE GRADE III DUCTAL CARCNIOMA, SPANNING 3.9 CM IN GREATEST DIMENSION.  - NEOADJUVANT CHANGES ARE PRESENT IN BREAST PARENCHYMA.  - MARGINS ARE NEGATIVE.  - NINE OF THIRTEEN LYMPH NODES ARE POSITIVE FOR METASTATIC DUCTAL CARCINOMA (9/13).  - EXTRACAPSULAR EXTENSION IS PRESENT.  - SEE ONCOLOGY TEMPLATE AND COMMENT.  Microscopic Comment  BREAST, INVASIVE TUMOR, WITH LYMPH NODE SAMPLING  Specimen, including laterality and lymph node sampling (sentinel, non-sentinel): Left breast with axillary  contents.  Procedure: Left modified radical mastectomy.  Histologic type: Ductal carcinoma.  Grade: III, see below comment.  Tubule formation: 3.  Nuclear pleomorphism: 3.  Mitotic: 2.  Tumor size (gross measurement): 3.9 cm.  Margins:  Invasive, distance to closest margin: 0.8 cm (deep margin).  In-situ, distance to closest margin: N/A.  Lymphovascular invasion: Although definitive lymph/vascular invasion is not identified, multiple lymph nodes  are positive for metastatic ductal carcinoma, see below.  Ductal carcinoma in situ: Not identified.  Lobular neoplasia: Not identified.  Tumor focality: Unifocal.  Treatment effect: Yes, neoadjuvant effect is present within breast parenchyma. Lymph nodes also  demonstrate some degree of neoadjuvant change. However, tumor is present within both the breast and  lymph nodes.  Extent of tumor:  Skin: Not involved.  Nipple: Not involved.  1 of 3  FINAL for [name], Christine W [phone])  Impression: Status  post left modified radical mastectomy after neoadjuvant chemotherapy for locally aggressive left breast cancer  Plan: Return to clinic in 10-14 days . She is to follow up with oncology.

## 2013-08-12 NOTE — Progress Notes (Signed)
Patient returns after left modified radical mastectomy after neoadjuvant chemotherapy. She still had tumor within the mastectomy specimen lymph nodes. This seemed to be smaller than when she started. She is still having fair amount of drainage of both of her drains but the axillary drain is less than 30 cc a day.  Exam: Left mastectomy wound clean dry and intact. JP drainage from the axillary drain was less than 30 cc was removed today. Breast, modified radical mastectomy , Left - INVASIVE GRADE III DUCTAL CARCNIOMA, SPANNING 3.9 CM IN GREATEST DIMENSION. - NEOADJUVANT CHANGES ARE PRESENT IN BREAST PARENCHYMA. - MARGINS ARE NEGATIVE. - NINE OF THIRTEEN LYMPH NODES ARE POSITIVE FOR METASTATIC DUCTAL CARCINOMA (9/13). - EXTRACAPSULAR EXTENSION IS PRESENT. - SEE ONCOLOGY TEMPLATE AND COMMENT. Microscopic Comment BREAST, INVASIVE TUMOR, WITH LYMPH NODE SAMPLING Specimen, including laterality and lymph node sampling (sentinel, non-sentinel): Left breast with axillary contents. Procedure: Left modified radical mastectomy. Histologic type: Ductal carcinoma. Grade: III, see below comment. Tubule formation: 3. Nuclear pleomorphism: 3. Mitotic: 2. Tumor size (gross measurement): 3.9 cm. Margins: Invasive, distance to closest margin: 0.8 cm (deep margin). In-situ, distance to closest margin: N/A. Lymphovascular invasion: Although definitive lymph/vascular invasion is not identified, multiple lymph nodes are positive for metastatic ductal carcinoma, see below. Ductal carcinoma in situ: Not identified. Lobular neoplasia: Not identified. Tumor focality: Unifocal. Treatment effect: Yes, neoadjuvant effect is present within breast parenchyma. Lymph nodes also demonstrate some degree of neoadjuvant change. However, tumor is present within both the breast and lymph nodes. Extent of tumor: Skin: Not involved. Nipple: Not involved. 1 of 3 FINAL for [name], Christine [name] [phone])  Impression: Status  post left modified radical mastectomy after neoadjuvant chemotherapy for locally aggressive left breast cancer  Plan: Return to clinic in 10-14 days. She is to follow up with oncology

## 2013-08-15 ENCOUNTER — Ambulatory Visit: Payer: BC Managed Care – PPO | Admitting: Oncology

## 2013-08-17 ENCOUNTER — Other Ambulatory Visit: Payer: Self-pay | Admitting: Emergency Medicine

## 2013-08-17 DIAGNOSIS — C50919 Malignant neoplasm of unspecified site of unspecified female breast: Secondary | ICD-10-CM

## 2013-08-18 ENCOUNTER — Ambulatory Visit (HOSPITAL_BASED_OUTPATIENT_CLINIC_OR_DEPARTMENT_OTHER): Payer: BC Managed Care – PPO | Admitting: Oncology

## 2013-08-18 ENCOUNTER — Encounter: Payer: Self-pay | Admitting: Oncology

## 2013-08-18 ENCOUNTER — Telehealth: Payer: Self-pay | Admitting: Oncology

## 2013-08-18 ENCOUNTER — Other Ambulatory Visit (HOSPITAL_BASED_OUTPATIENT_CLINIC_OR_DEPARTMENT_OTHER): Payer: BC Managed Care – PPO

## 2013-08-18 VITALS — BP 161/100 | HR 97 | Temp 98.6°F | Resp 20 | Ht 64.0 in | Wt 205.5 lb

## 2013-08-18 DIAGNOSIS — Z853 Personal history of malignant neoplasm of breast: Secondary | ICD-10-CM

## 2013-08-18 DIAGNOSIS — C50919 Malignant neoplasm of unspecified site of unspecified female breast: Secondary | ICD-10-CM

## 2013-08-18 DIAGNOSIS — C778 Secondary and unspecified malignant neoplasm of lymph nodes of multiple regions: Secondary | ICD-10-CM

## 2013-08-18 LAB — COMPREHENSIVE METABOLIC PANEL (CC13)
ALT: 14 U/L (ref 0–55)
ANION GAP: 12 meq/L — AB (ref 3–11)
AST: 20 U/L (ref 5–34)
Albumin: 4 g/dL (ref 3.5–5.0)
Alkaline Phosphatase: 95 U/L (ref 40–150)
BILIRUBIN TOTAL: 0.36 mg/dL (ref 0.20–1.20)
BUN: 10.7 mg/dL (ref 7.0–26.0)
CO2: 28 meq/L (ref 22–29)
CREATININE: 0.8 mg/dL (ref 0.6–1.1)
Calcium: 9.9 mg/dL (ref 8.4–10.4)
Chloride: 105 mEq/L (ref 98–109)
GLUCOSE: 134 mg/dL (ref 70–140)
Potassium: 3.4 mEq/L — ABNORMAL LOW (ref 3.5–5.1)
SODIUM: 145 meq/L (ref 136–145)
TOTAL PROTEIN: 7.3 g/dL (ref 6.4–8.3)

## 2013-08-18 LAB — CBC WITH DIFFERENTIAL/PLATELET
BASO%: 0.3 % (ref 0.0–2.0)
Basophils Absolute: 0 10*3/uL (ref 0.0–0.1)
EOS ABS: 0.1 10*3/uL (ref 0.0–0.5)
EOS%: 1.3 % (ref 0.0–7.0)
HEMATOCRIT: 34.9 % (ref 34.8–46.6)
HGB: 11.8 g/dL (ref 11.6–15.9)
LYMPH%: 43.4 % (ref 14.0–49.7)
MCH: 33 pg (ref 25.1–34.0)
MCHC: 33.8 g/dL (ref 31.5–36.0)
MCV: 97.7 fL (ref 79.5–101.0)
MONO#: 0.5 10*3/uL (ref 0.1–0.9)
MONO%: 8.9 % (ref 0.0–14.0)
NEUT#: 2.5 10*3/uL (ref 1.5–6.5)
NEUT%: 46.1 % (ref 38.4–76.8)
Platelets: 229 10*3/uL (ref 145–400)
RBC: 3.57 10*6/uL — AB (ref 3.70–5.45)
RDW: 14.5 % (ref 11.2–14.5)
WBC: 5.3 10*3/uL (ref 3.9–10.3)
lymph#: 2.3 10*3/uL (ref 0.9–3.3)

## 2013-08-18 MED ORDER — CAPECITABINE 500 MG PO TABS: ORAL_TABLET | ORAL | Status: DC

## 2013-08-18 MED ORDER — LORAZEPAM 0.5 MG PO TABS: 0.5000 mg | ORAL_TABLET | Freq: Three times a day (TID) | ORAL | Status: DC

## 2013-08-18 NOTE — Progress Notes (Signed)
Christine [name]  [phone] [phone]  OFFICE PROGRESS NOTE  ID: Jnyah Brazee Klahn   DOB: November 10, 1955  MR#: [id number]  QPY#:[id number]   PCP: Georgetta Haber, MD SU:  Erroll Luna, M.D.   DIAGNOSIS: Christine [name] is a 58 y.o.  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   PRIOR THERAPY: 1. Patient has history of right breast cancer patient underwent a mastectomy when she was 58 years old. This occurred during her pregnancy. After that patient did not take any kind of further treatments. She continued to do well.   2. She recently noticed her left nipple had become itchy and retracted.  She underwent mammogram with ultrasound that showed 2 areas of concern in the left breast (at the 12 o'clock position and 3 o'clock position).  An ultrasound performed that showed the diameter to be at least 6 cm and multiple left axillary lymph nodes that were abnormal. A left breast needle core biopsy performed on 09/30/2012 at the 2 o'clock position showed invasive ductal carcinoma grade 3, estrogen receptor negative, progesterone receptor negative,proliferation marker Ki-67 21%,  HER-2/neu negative.  Left breast needle core biopsy at the 3 o'clock position showed grade 3 ductal carcinoma in situ.  3.  Bilateral breast MRI on 10/12/2012 showed a previous right breast mastectomy and marked diffuse enhancement involving majority of the left breast measuring 13.0 x 12.5 x 9.5 cm. There was diffuse skin thickening and dermal enhancement. Multiple enlarged level I left axillary lymph nodes. The largest lymph node measured 2.4 cm in size. The large area of enhancement within the left breast extends posteriorly to abut the anterior portion of the pectoralis muscle. There was no evidence of internal  mammary adenopathy or right axillary adenopathy.   4. PET/CT did show the left breast mass, and left axillary and subpectoralis nodal metastases, but no further areas throughout the chest/abd/pelvis of metastases.   5.  Patient received 6 cycles of neoadjuvant chemotherapy consisting of FEC (5-FU/epirubicin/Cytoxan) from 11/02/2012 through 01/25/2013 with Neulasta support.  This was followed by 8 cycles of weekly Taxol/Carbo that started from 02/08/2013 through 04/19/2013.  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  She began neoadjuvant chemotherapy consisting of Gemzar/Carboplatin on 05/24/2013 with day 2 Neulasta support.   Gemzar/carboplatinum completed 06/06/2013 for a total of 2 cycles..  #6 status post left mastectomy 07/19/2013. Pathology revealed: Diagnosis Breast, modified radical mastectomy , Left - INVASIVE GRADE III DUCTAL CARCNIOMA, SPANNING 3.9 CM IN GREATEST DIMENSION. - NEOADJUVANT CHANGES ARE PRESENT IN BREAST PARENCHYMA. - MARGINS ARE NEGATIVE. - NINE OF THIRTEEN LYMPH NODES ARE POSITIVE FOR METASTATIC DUCTAL CARCINOMA (9/13). - EXTRACAPSULAR EXTENSION IS PRESENT. - SEE ONCOLOGY TEMPLATE AND COMMENT.  CURRENT THERAPY: Refer to radiation oncology for post mastectomy radiation  INTERVAL HISTORY: Christine GIAIMO is a  58 y.o. female for followup visit after her mastectomy. She tolerated the procedure well. She has minimal pain. Patient and I discussed her pathology in detail. She understands that she had a 3.9 cm residual tumor 9 of 13 lymph nodes were positive for metastatic disease. We discussed role of adjuvant radiation therapy with concurrent Xeloda. We discussed risks benefits and side effects of this. Clinically patient seems to be doing well she still has some neuropathy she is  taking gabapentin. She denies any fevers chills night sweats no shortness of breath no chest pains or  palpitations. She has no bleeding no bowel or bladder changes. Remainder of the 10 point review of systems is negative.  MEDICAL HISTORY: Past Medical History  Diagnosis Date  . ALLERGIC RHINITIS   . GERD (gastroesophageal reflux disease)   . Hypertension   . Hypothyroidism   . Hx of colonic polyps   . Wears glasses   . Breast cancer 35, age 59    right  . Breast cancer 09/30/12    left, ER/PR -, Her 2 -    ALLERGIES:   Allergies  Allergen Reactions  . Sulfa Antibiotics Hives, Itching and Swelling  . Sulfonamide Derivatives Hives, Itching and Swelling     MEDICATIONS:  Current Outpatient Prescriptions  Medication Sig Dispense Refill  . diltiazem (CARDIZEM CD) 240 MG 24 hr capsule TAKE ONE CAPSULE BY MOUTH EVERY DAY  90 capsule  3  . fluticasone (FLONASE) 50 MCG/ACT nasal spray Place 2 sprays into the nose daily.  16 g  2  . gabapentin (NEURONTIN) 300 MG capsule Take 1 capsule (300 mg total) by mouth 3 (three) times daily. 3 month supply  270 capsule  0  . latanoprost (XALATAN) 0.005 % ophthalmic solution Place 1 drop into both eyes at bedtime.       Marland Kitchen levothyroxine (SYNTHROID, LEVOTHROID) 50 MCG tablet TAKE 1 TABLET BY MOUTH EVERY DAY  90 tablet  2  . losartan-hydrochlorothiazide (HYZAAR) 50-12.5 MG per tablet TAKE 1 TABLET BY MOUTH DAILY.  90 tablet  3  . NEXIUM 40 MG capsule TAKE 1 CAPSULE (40 MG TOTAL) BY MOUTH DAILY BEFORE BREAKFAST.  90 capsule  1  . oxyCODONE-acetaminophen (ROXICET) 5-325 MG per tablet Take 1 tablet by mouth every 4 (four) hours as needed.  30 tablet  0  . potassium chloride SA (K-DUR,KLOR-CON) 20 MEQ tablet Take 1 tablet (20 mEq total) by mouth 3 (three) times daily.  90 tablet  1   No current facility-administered medications for this visit.    SURGICAL HISTORY:  Past Surgical History  Procedure Laterality Date  . Tonsillectomy    . Caesarean section      x 1  . Mastectomy  04/1987    right side  . Abdominal hysterectomy  06/1988    fibroids   . Portacath placement Right 10/28/2012    Procedure: PORT PLACEMENT;  Surgeon: Joyice Faster. Cornett, MD;  Location: [place];  Service: General;  Laterality: Right;  Right Subclavian Vein  . Mastectomy modified radical Left 07/19/2013    Procedure: MASTECTOMY MODIFIED RADICAL;  Surgeon: Marcello Moores A. Cornett, MD;  Location: [place];  Service: General;  Laterality: Left;  . Port-a-cath removal Right 07/19/2013    Procedure: REMOVAL PORT-A-CATH;  Surgeon: Joyice Faster. Cornett, MD;  Location: [place];  Service: General;  Laterality: Right;  . Breast surgery Left 07/19/13    MRM    REVIEW OF SYSTEMS:  A 10 point review of systems was conducted and is otherwise negative except for what is noted above.      PHYSICAL EXAMINATION: Blood pressure 161/100, pulse 97, temperature 98.6 F (37 C), temperature source Oral, resp. rate 20, height $RemoveBe'5\' 4"'LXWDwRkcf$  (1.626 m), weight 205 lb 8 oz (93.214 kg). Body mass index is 35.26 kg/(m^2). General: Patient is a well appearing female in no acute distress HEENT: PERRLA, sclerae anicteric no conjunctival pallor, MMM Neck: supple, no palpable adenopathy  Lungs: clear to auscultation bilaterally, no wheezes, rhonchi, or rales Cardiovascular: regular rate rhythm, S1, S2, no murmurs, rubs or gallops Abdomen: Soft, non-tender, non-distended, normoactive bowel sounds, no HSM Extremities: warm and well perfused, no clubbing, cyanosis, or edema Skin: No rashes or lesions Neuro: Non-focal Breasts: Left mastectomy site looks well-healed  ECOG PERFORMANCE STATUS: 1 - Symptomatic but completely ambulatory     LABORATORY DATA: Lab Results  Component Value Date   WBC 5.3 08/18/2013   HGB 11.8 08/18/2013   HCT 34.9 08/18/2013   MCV 97.7 08/18/2013   PLT 229 08/18/2013      Chemistry      Component Value Date/Time   NA 145 08/18/2013 1325   NA 144 10/28/2012 1248   K 3.4* 08/18/2013 1325   K 3.9 10/28/2012 1248   CL 101  01/25/2013 0952   CL 108 10/28/2012 1248   CO2 28 08/18/2013 1325   CO2 29 09/13/2011 1910   BUN 10.7 08/18/2013 1325   BUN 10 10/28/2012 1248   CREATININE 0.8 08/18/2013 1325   CREATININE 0.80 10/28/2012 1248      Component Value Date/Time   CALCIUM 9.9 08/18/2013 1325   CALCIUM 9.4 09/13/2011 1910   ALKPHOS 95 08/18/2013 1325   ALKPHOS 78 09/13/2011 1910   AST 20 08/18/2013 1325   AST 18 09/13/2011 1910   ALT 14 08/18/2013 1325   ALT 12 09/13/2011 1910   BILITOT 0.36 08/18/2013 1325   BILITOT 0.3 09/13/2011 1910       RADIOGRAPHIC STUDIES: No results found.    ASSESSMENT:   AIRYANA SPRUNGER is a 58 y.o. female with:   #1 History of breast cancer of the right breast status post mastectomy when she was 58 years old. Due to this she was referred for genetic testing.   #2 She recently noticed her left nipple had become itchy and retracted.  She underwent mammogram with ultrasound that showed 2 areas of concern in the left breast (at the 12 o'clock position and 3 o'clock position).  An ultrasound performed that showed the diameter to be at least 6 cm and multiple left axillary lymph nodes that were abnormal. A left breast needle core biopsy performed on 09/30/2012 at the 2 o'clock position showed invasive ductal carcinoma grade 3, estrogen receptor negative, progesterone receptor negative,proliferation marker Ki-67 21%,  HER-2/neu negative.  Left breast needle core biopsy at the 3 o'clock position showed grade 3 ductal carcinoma in situ.  #3  Bilateral breast MRI on 10/12/2012 showed a previous right breast mastectomy and marked diffuse enhancement involving majority of the left breast measuring 13.0 x 12.5 x 9.5 cm. There was diffuse skin thickening and dermal enhancement. Multiple enlarged level I left axillary lymph nodes. The largest lymph node measured 2.4 cm in size. The large area of enhancement within the left breast extends posteriorly to abut the anterior portion of the pectoralis muscle. There was  no evidence of internal mammary adenopathy or right axillary adenopathy.   #4. PET/CT did show the left breast mass, and left axillary and subpectoralis nodal metastases, but no further areas throughout the chest/abd/pelvis of metastases.  She then went on to receive a 2-D echocardiogram, Port-A-Cath placement, and attended chemotherapy class.   #5   Patient received 6 cycles of neoadjuvant chemotherapy consisting of FEC (5-FU/epirubicin/Cytoxan) from 11/02/2012 through 01/25/2013 with Neulasta support.  This was followed by 8 cycles of weekly Taxol/Carbo that started from 02/08/2013 through 04/19/2013.  Patient received 8 of 12 scheduled cycles  of Taxol/Carboplatin before therapy was discontinued secondary to progressive neuropathies.  She began neoadjuvant chemotherapy consisting of Gemzar/Carboplatin on 05/24/2013 with day 2 Neulasta support.   She received 2 cycles of this.   #6 patient is status post mastectomy on 07/19/2013. Pathology as above.  PLAN:   #1 patient will need radiation therapy. With that I am planning on doing concurrent Xeloda. We discussed the rationale for the side effects. Prescription will be sent to [facility] for this.  #2 I will see the patient back in 2-3 months week's time for followup  All questions answered.  Ms. Lonardo was encouraged to contact us in the interim with any questions, concerns, or problems.  I spent 25 minutes counseling the patient face to face.  The total time spent in the appointment was 30 minutes.   Marcy Panning, MD Medical/Oncology [facility] [phone] (beeper) [phone] (Office)

## 2013-08-18 NOTE — Patient Instructions (Signed)
Capecitabine tablets What is this medicine? CAPECITABINE (ka pe SITE a been) is a chemotherapy drug. It slows the growth of cancer cells. This medicine is used to treat breast cancer, and also colon or rectal cancer. This medicine may be used for other purposes; ask your health care provider or pharmacist if you have questions. COMMON BRAND NAME(S): Xeloda What should I tell my health care provider before I take this medicine? They need to know if you have any of these conditions: -bleeding or blood disorders -dihydropyrimidine dehydrogenase (DPD) deficiency -heart disease -infection (especially a virus infection such as chickenpox, cold sores, or herpes) -kidney disease -liver disease -an unusual or allergic reaction to capecitabine, 5-fluorouracil, other medicines, foods, dyes, or preservatives -pregnant or trying to get pregnant -breast-feeding How should I use this medicine? Take this medicine by mouth with a glass of water, within 30 minutes of the end of a meal. Follow the directions on the prescription label. Take your medicine at regular intervals. Do not take it more often than directed. Do not stop taking except on your doctor's advice. Your doctor may want you to take a combination of 150 mg and 500 mg tablets for each dose. It is very important that you know how to correctly take your dose. Taking the wrong tablets could result in an overdose (too much medication) or underdose (too little medication). Talk to your pediatrician regarding the use of this medicine in children. Special care may be needed. Overdosage: If you think you have taken too much of this medicine contact a poison control center or emergency room at once. NOTE: This medicine is only for you. Do not share this medicine with others. What if I miss a dose? If you miss a dose, do not take the missed dose at all. Do not take double or extra doses. Instead, continue with your next scheduled dose and check with your  doctor. What may interact with this medicine? -antacids with aluminum and/or magnesium -folic acid -leucovorin -medicines to increase blood counts like filgrastim, pegfilgrastim, sargramostim -phenytoin -vaccines -warfarin Talk to your doctor or health care professional before taking any of these medicines: -acetaminophen -aspirin -ibuprofen -ketoprofen -naproxen This list may not describe all possible interactions. Give your health care provider a list of all the medicines, herbs, non-prescription drugs, or dietary supplements you use. Also tell them if you smoke, drink alcohol, or use illegal drugs. Some items may interact with your medicine. What should I watch for while using this medicine? Visit your doctor for checks on your progress. This drug may make you feel generally unwell. This is not uncommon, as chemotherapy can affect healthy cells as well as cancer cells. Report any side effects. Continue your course of treatment even though you feel ill unless your doctor tells you to stop. In some cases, you may be given additional medicines to help with side effects. Follow all directions for their use. Call your doctor or health care professional for advice if you get a fever, chills or sore throat, or other symptoms of a cold or flu. Do not treat yourself. This drug decreases your body's ability to fight infections. Try to avoid being around people who are sick. This medicine may increase your risk to bruise or bleed. Call your doctor or health care professional if you notice any unusual bleeding. Be careful brushing and flossing your teeth or using a toothpick because you may get an infection or bleed more easily. If you have any dental work done, tell your   dentist you are receiving this medicine. Avoid taking products that contain aspirin, acetaminophen, ibuprofen, naproxen, or ketoprofen unless instructed by your doctor. These medicines may hide a fever. Do not become pregnant while  taking this medicine. Women should inform their doctor if they wish to become pregnant or think they might be pregnant. There is a potential for serious side effects to an unborn child. Talk to your health care professional or pharmacist for more information. Do not breast-feed an infant while taking this medicine. Men are advised not to father a child while taking this medicine. What side effects may I notice from receiving this medicine? Side effects that you should report to your doctor or health care professional as soon as possible: -allergic reactions like skin rash, itching or hives, swelling of the face, lips, or tongue -low blood counts - this medicine may decrease the number of white blood cells, red blood cells and platelets. You may be at increased risk for infections and bleeding. -signs of infection - fever or chills, cough, sore throat, pain or difficulty passing urine -signs of decreased platelets or bleeding - bruising, pinpoint red spots on the skin, black, tarry stools, blood in the urine -signs of decreased red blood cells - unusually weak or tired, fainting spells, lightheadedness -breathing problems -changes in vision -chest pain -diarrhea of more than 4 bowel movements in one day or any diarrhea at night -mouth sores -nausea and vomiting -pain, swelling, redness at site where injected -pain, tingling, numbness in the hands or feet -redness, swelling, or sores on hands or feet -stomach pain -vomiting -yellow color of skin or eyes Side effects that usually do not require medical attention (report to your doctor or health care professional if they continue or are bothersome): -constipation -diarrhea -dry or itchy skin -hair loss -loss of appetite -nausea -weak or tired This list may not describe all possible side effects. Call your doctor for medical advice about side effects. You may report side effects to FDA at 1-800-FDA-1088. Where should I keep my  medicine? Keep out of the reach of children. Store at room temperature between 15 and 30 degrees C (59 and 86 degrees F). Keep container tightly closed. Throw away any unused medicine after the expiration date. NOTE: This sheet is a summary. It may not cover all possible information. If you have questions about this medicine, talk to your doctor, pharmacist, or health care provider.  2014, Elsevier/Gold Standard. (2008-07-03 11:47:41)   

## 2013-08-22 ENCOUNTER — Encounter: Payer: Self-pay | Admitting: Oncology

## 2013-08-22 NOTE — Progress Notes (Signed)
Faxed xeloda prescription to [facility]. °

## 2013-08-26 ENCOUNTER — Encounter (INDEPENDENT_AMBULATORY_CARE_PROVIDER_SITE_OTHER): Payer: Self-pay | Admitting: Surgery

## 2013-08-26 ENCOUNTER — Ambulatory Visit (INDEPENDENT_AMBULATORY_CARE_PROVIDER_SITE_OTHER): Payer: BC Managed Care – PPO | Admitting: Surgery

## 2013-08-26 VITALS — BP 122/84 | HR 86 | Resp 16 | Ht 64.0 in | Wt 202.6 lb

## 2013-08-26 DIAGNOSIS — Z9889 Other specified postprocedural states: Secondary | ICD-10-CM

## 2013-08-26 NOTE — Progress Notes (Signed)
Patient returns after left modified radical mastectomy after neoadjuvant chemotherapy. She still had tumor within the mastectomy specimen lymph nodes. DRAINS OUT and she feels OK  Exam: Left mastectomy wound clean dry and intact. SMALL SEROMA NOTED.  NOT BIG ENOUGH TO ASPIRATE Breast, modified radical mastectomy , Left - INVASIVE GRADE III DUCTAL CARCNIOMA, SPANNING 3.9 CM IN GREATEST DIMENSION. - NEOADJUVANT CHANGES ARE PRESENT IN BREAST PARENCHYMA. - MARGINS ARE NEGATIVE. - NINE OF THIRTEEN LYMPH NODES ARE POSITIVE FOR METASTATIC DUCTAL CARCINOMA (9/13). - EXTRACAPSULAR EXTENSION IS PRESENT. - SEE ONCOLOGY TEMPLATE AND COMMENT. Microscopic Comment BREAST, INVASIVE TUMOR, WITH LYMPH NODE SAMPLING Specimen, including laterality and lymph node sampling (sentinel, non-sentinel): Left breast with axillary contents. Procedure: Left modified radical mastectomy. Histologic type: Ductal carcinoma. Grade: III, see below comment. Tubule formation: 3. Nuclear pleomorphism: 3. Mitotic: 2. Tumor size (gross measurement): 3.9 cm. Margins: Invasive, distance to closest margin: 0.8 cm (deep margin). In-situ, distance to closest margin: N/A. Lymphovascular invasion: Although definitive lymph/vascular invasion is not identified, multiple lymph nodes are positive for metastatic ductal carcinoma, see below. Ductal carcinoma in situ: Not identified. Lobular neoplasia: Not identified. Tumor focality: Unifocal. Treatment effect: Yes, neoadjuvant effect is present within breast parenchyma. Lymph nodes also demonstrate some degree of neoadjuvant change. However, tumor is present within both the breast and lymph nodes. Extent of tumor: Skin: Not involved. Nipple: Not involved. 1 of 3 FINAL for Leaman, Allsion W ([id number])  Impression: Status post left modified radical mastectomy after neoadjuvant chemotherapy for locally aggressive left breast cancer  Plan: Return to clinic in 6 MONTHS. She is to  follow up with RADIATION oncology AND IS ON XELODA.

## 2013-08-26 NOTE — Patient Instructions (Signed)
RETURN 6 MONTHS.  PROCEED WITH RADIATION.

## 2013-08-31 ENCOUNTER — Ambulatory Visit
Admission: RE | Admit: 2013-08-31 | Discharge: 2013-08-31 | Disposition: A | Discharge status: Home / Self Care | Payer: BC Managed Care – PPO | Source: Ambulatory Visit | Attending: Radiation Oncology | Admitting: Radiation Oncology

## 2013-08-31 VITALS — BP 134/89 | HR 90 | Temp 98.6°F | Ht 64.0 in | Wt 201.6 lb

## 2013-08-31 DIAGNOSIS — Z79899 Other long term (current) drug therapy: Secondary | ICD-10-CM | POA: Insufficient documentation

## 2013-08-31 DIAGNOSIS — Z9221 Personal history of antineoplastic chemotherapy: Secondary | ICD-10-CM | POA: Insufficient documentation

## 2013-08-31 DIAGNOSIS — C773 Secondary and unspecified malignant neoplasm of axilla and upper limb lymph nodes: Secondary | ICD-10-CM | POA: Insufficient documentation

## 2013-08-31 DIAGNOSIS — C50919 Malignant neoplasm of unspecified site of unspecified female breast: Secondary | ICD-10-CM | POA: Insufficient documentation

## 2013-08-31 DIAGNOSIS — Z9010 Acquired absence of unspecified breast and nipple: Secondary | ICD-10-CM | POA: Insufficient documentation

## 2013-08-31 NOTE — Progress Notes (Signed)
Please see the Nurse Progress Note in the MD Initial Consult Encounter for this patient. 

## 2013-08-31 NOTE — Progress Notes (Signed)
Location of Breast Cancer: Left breast triple negative invasive ductal carcinoma  Histology per Pathology Report:   Breast, modified radical mastectomy , Left - INVASIVE GRADE III DUCTAL CARCNIOMA, SPANNING 3.9 CM IN GREATEST DIMENSION. - NEOADJUVANT CHANGES ARE PRESENT IN BREAST PARENCHYMA. - MARGINS ARE NEGATIVE. - NINE OF THIRTEEN LYMPH NODES ARE POSITIVE FOR METASTATIC DUCTAL CARCINOMA (9/13). - EXTRACAPSULAR EXTENSION IS PRESENT.  Receptor Status: ER(negative), PR (negative), Her2-neu (negative)  Did patient present with symptoms (if so, please note symptoms) or was this found on screening mammography?: Patient has a history of right breast cancer and had a right mastectomy at age 24.  She noticed that her left nipple had become itchy and retracted.  She then had a mammogram.which showed 2 areas of concern in her left breast.  Past/Anticipated interventions by surgeon, if any: Procedure: MASTECTOMY MODIFIED RADICAL;  Surgeon: Joyice Faster. Cornett, MD;  Location: [place];  Service: General;  Laterality: Left;  Past/Anticipated interventions by medical oncology, if any: Patient received 6 cycles of neoadjuvant chemotherapy consisting of FEC (5-FU/epirubicin/Cytoxan) from 11/02/2012 through 01/25/2013 with Neulasta support. This was followed by 8 cycles of weekly Taxol/Carbo that started from 02/08/2013 through 04/19/2013. 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 She began neoadjuvant chemotherapy consisting of Gemzar/Carboplatin on 05/24/2013 with day 2 Neulasta support. Gemzar/carboplatinum completed 06/06/2013 for a total of 2 cycles..  Dr. Humphrey Rolls is planning to order concurrent Xeloda with radiation.   Lymphedema issues, if any:  Yes - to right upper arm/right back.  Also has a small amount of swelling in left underarm.  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  SAFETY ISSUES:  Prior radiation? no  Pacemaker/ICD? no  Possible current pregnancy?no  Is the patient on methotrexate? no  Current Complaints / other details:  Patient denies pain today. Meanrche age 53, G82 , p 1, menopause [age], no HRT.

## 2013-09-01 ENCOUNTER — Encounter: Payer: Self-pay | Admitting: *Deleted

## 2013-09-01 NOTE — Progress Notes (Addendum)
[facility] Psychosocial Distress Screening Clinical Social Work  Clinical Social Work was referred by distress screening protocol.  The patient scored a 7 on the Psychosocial Distress Thermometer which indicates moderate distress. Clinical Social Worker met with Pt prior to her radonc appointment to assess for distress and other psychosocial needs. Pt expressed concerns about increasing anxiety since her surgery and as she has begun her cancer treatment. CSW discussed common reactions/emotions experienced when going through cancer treatment and suggested coping techniques as well. Pt shared she had been using relaxation techniques and has a good support network. CSW reviewed programs here at [facility] to assist with coping as well. Pt is interested in attending. CSW to call Pt to check in after her appointment and reassess needs.   Clinical Social Worker follow up needed: yes  If yes, follow up plan: Follow up call to reassess needs.   Loren Racer, LCSW Clinical Social Worker [facility] [employer] Wednesday, Thursday and Friday Phone: [phone] Fax: [phone]       Addendum:  CSW spoke with Pt by phone today and she reports to be in much better spirits. She reports her techniques to help her cope with her anxiety are helping. Pt agrees to call CSW as needed. CSW will continue to check in on Pt.   Loren Racer, LCSW Clinical Social Worker [facility] [place] Wednesday, Thursday and Friday Phone: [phone] Fax: [phone]

## 2013-09-01 NOTE — Progress Notes (Signed)
Department of Radiation Oncology  Phone:  [phone] Fax:        [phone]   Name: TESHARA MOREE MRN: [id number]  DOB: 1955-09-25  Date: 08/31/2013  Follow Up Visit Note  Diagnosis: T2N3 left breast cancer (history of right breast cancer treated with mastectomy alone 25+ years ago)  Interval History: Christine [name] presents today for routine followup.  She is recovering from her mastectomy and node dissection. Her drains have been removed. She is disappointed in her pathology report. She is ready to begin radiation. Her mastectomy specimen showed residual grade 3 invasive ductal carcinoma measuring 3.9 cm with neoadjuvant changes. Margins were negative.Nine of 13 lymph nodes were positive for disease with extracapsular extension. Her tumor continued to be triple negative with a Ki-67 of 21%.  Allergies:  Allergies  Allergen Reactions  . Sulfa Antibiotics Hives, Itching and Swelling  . Sulfonamide Derivatives Hives, Itching and Swelling    Medications:  Current Outpatient Prescriptions  Medication Sig Dispense Refill  . diltiazem (CARDIZEM CD) 240 MG 24 hr capsule TAKE ONE CAPSULE BY MOUTH EVERY DAY  90 capsule  3  . fluticasone (FLONASE) 50 MCG/ACT nasal spray Place 2 sprays into the nose daily.  16 g  2  . gabapentin (NEURONTIN) 300 MG capsule Take 1 capsule (300 mg total) by mouth 3 (three) times daily. 3 month supply  270 capsule  0  . latanoprost (XALATAN) 0.005 % ophthalmic solution Place 1 drop into both eyes at bedtime.       Marland Kitchen levothyroxine (SYNTHROID, LEVOTHROID) 50 MCG tablet TAKE 1 TABLET BY MOUTH EVERY DAY  90 tablet  2  . LORazepam (ATIVAN) 0.5 MG tablet Take 1 tablet (0.5 mg total) by mouth every 8 (eight) hours.  30 tablet  0  . losartan-hydrochlorothiazide (HYZAAR) 50-12.5 MG per tablet TAKE 1 TABLET BY MOUTH DAILY.  90 tablet  3  . NEXIUM 40 MG capsule TAKE 1 CAPSULE (40 MG TOTAL) BY MOUTH DAILY BEFORE BREAKFAST.  90 capsule  1  . potassium chloride SA  (K-DUR,KLOR-CON) 20 MEQ tablet Take 1 tablet (20 mEq total) by mouth 3 (three) times daily.  90 tablet  1  . capecitabine (XELODA) 500 MG tablet 1000 mg po twice a day concurrent with radiation therapy  120 tablet  3   No current facility-administered medications for this encounter.    Physical Exam:  Filed Vitals:   08/31/13 1500  BP: 134/89  Pulse: 90  Temp: 98.6 F (37 C)  Height: $Remove'5\' 4"'eIGaYyI$  (1.626 m)  Weight: 201 lb 9.6 oz (91.445 kg)   status post left mastectomy with no evidence of lymphedema. Normal range of motion of left and right arm.  IMPRESSION: Christine [name] is a 58 y.o. female status post neoadjuvant chemotherapy, left mastectomy and axillary node dissection with multiple nodes positive and a large fungating tumor prior to neoadjuvant treatment as well as a positive IM node on initial PET scan  PLAN:  I talked to the patient today regarding the indications for radiation. I strongly recommended radiation to the chest wall and nodal basins given her the presence of multiple positive lymph nodes despite neoadjuvant therapy as well as her initial presentation of disease. She also had a positive internal mammary lymph node on her initial PET scan and we will need to include that as well. We discussed 33 treatments as an outpatient. We discussed the process of simulation the placement tattoos. We discussed skin redness, darkness and fatigue as possible side effects. Dr. Humphrey Rolls  would like to absolute 02 her radiation I think that reasonable. We discussed the possible use of I MRT or breath hold technique for cardiac sparing given the need to treat her internal mammary lymph nodes. I've scheduled her for simulation on Friday. It we discussed the low likelihood of lung or rib damage which would be asymptomatic. She has signed informed consent and agree to proceed forward.    Thea Silversmith, MD

## 2013-09-02 ENCOUNTER — Ambulatory Visit
Admission: RE | Admit: 2013-09-02 | Discharge: 2013-09-02 | Disposition: A | Discharge status: Home / Self Care | Payer: BLUE CROSS/BLUE SHIELD | Source: Ambulatory Visit | Attending: Radiation Oncology | Admitting: Radiation Oncology

## 2013-09-02 DIAGNOSIS — Z510 Encounter for antineoplastic radiation therapy: Secondary | ICD-10-CM | POA: Insufficient documentation

## 2013-09-02 DIAGNOSIS — C50919 Malignant neoplasm of unspecified site of unspecified female breast: Secondary | ICD-10-CM | POA: Insufficient documentation

## 2013-09-02 NOTE — Progress Notes (Addendum)
Name: Christine [name]   MRN: [id number]  Date:  09/02/2013  DOB: 1955-11-06  Status:outpatient   DIAGNOSIS: Left Breast cancer.  CONSENT VERIFIED: yes SET UP: Patient is setup supine  IMMOBILIZATION:  The following immobilization was used:Custom Moldable Pillow, breast board.  NARRATIVE: [name] was brought to the [facility].  Identity was confirmed.  All relevant records and images related to the planned course of therapy were reviewed.  Then, the patient was positioned in a stable reproducible clinical set-up for radiation therapy.  Wires were placed to delineate the clinical extent of breast tissue. A wire was placed on the scar as well.  CT images were obtained.  An isocenter was placed. Skin markings were placed.  The position of the heart was then analyzed.  Due to the proximity of the heart to the chest wall, I felt she would benefit from deep inspiration breath hold for cardiac sparing.  She was then coached and rescanned in the breath hold position.  Acceptable cardiac sparing was achieved. The CT images were loaded into the planning software where the target and avoidance structures were contoured.  The radiation prescription was entered and confirmed. The patient was discharged in stable condition and tolerated simulation well.    TREATMENT PLANNING NOTE/3D Simulation Note Treatment planning then occurred. I have requested : MLC's, isodose plan, basic dose calculation  3D simulation was performed.  I personally supervised and oversaw the construction of 6 medically necessary complex treatment devices in the form of MLCs which will be used for beam modification purposes and to protect critical structures including the heart and lung as well as the immobilization devices which will be used to ensure reproducible set up.  I have requested a dose volume histogram of the heart lung and tumor cavity.    RESPIRATORY MOTION MANAGEMENT SIMULATION - Deep Inspiration Breath  Hold  NARRATIVE:  In order to account for effect of respiratory motion on target structures and other organs in the planning and delivery of radiotherapy, this patient underwent respiratory motion management simulation.  To accomplish this, when the patient was brought to the CT simulation planning suite, a bellows was placed on the her abdomen.  Wave forms of the patient's breathing were obtained. Coaching was performed and practice sessions initiated to monitor her ability to obtain and maintain deep inspiration breath hold.  The CT images were loaded into the planning software and fused with her free breathing images by physics.  Acceptable cardiac sparing was achieved through the use of deep inspiration breath hold.  Planning will be performed on her breath hold scan

## 2013-09-02 NOTE — Progress Notes (Signed)
Radiation Oncology         [phone] ________________________________  Name: Christine [name]      MRN: [id number]          Date: 09/02/2013              DOB: January 05, 1956  Optical Surface Tracking Plan:  Since intensity modulated radiotherapy (IMRT) and 3D conformal radiation treatment methods are predicated on accurate and precise positioning for treatment, intrafraction motion monitoring is medically necessary to ensure accurate and safe treatment delivery.  The ability to quantify intrafraction motion without excessive ionizing radiation dose can only be performed with optical surface tracking. Accordingly, surface imaging offers the opportunity to obtain 3D measurements of patient position throughout IMRT and 3D treatments without excessive radiation exposure.  I am ordering optical surface tracking for this patient's upcoming course of radiotherapy. ________________________________ Signature   Reference:   Ursula Alert, J, et al. Surface imaging-based analysis of intrafraction motion for breast radiotherapy patients.Journal of [place], n. 6, nov. 2014. ISSN [id number].   Available at: <http://www.jacmp.org/index.php/jacmp/article/view/4957>.

## 2013-09-09 ENCOUNTER — Ambulatory Visit: Payer: BLUE CROSS/BLUE SHIELD

## 2013-09-12 ENCOUNTER — Ambulatory Visit: Payer: BLUE CROSS/BLUE SHIELD

## 2013-09-13 ENCOUNTER — Ambulatory Visit: Payer: BLUE CROSS/BLUE SHIELD

## 2013-09-14 ENCOUNTER — Ambulatory Visit
Admission: RE | Admit: 2013-09-14 | Discharge: 2013-09-14 | Disposition: A | Discharge status: Home / Self Care | Payer: BLUE CROSS/BLUE SHIELD | Source: Ambulatory Visit | Attending: Radiation Oncology | Admitting: Radiation Oncology

## 2013-09-14 ENCOUNTER — Ambulatory Visit: Payer: BLUE CROSS/BLUE SHIELD

## 2013-09-14 DIAGNOSIS — C50919 Malignant neoplasm of unspecified site of unspecified female breast: Secondary | ICD-10-CM

## 2013-09-15 ENCOUNTER — Ambulatory Visit
Admission: RE | Admit: 2013-09-15 | Discharge: 2013-09-15 | Disposition: A | Discharge status: Home / Self Care | Payer: BLUE CROSS/BLUE SHIELD | Source: Ambulatory Visit | Attending: Radiation Oncology | Admitting: Radiation Oncology

## 2013-09-15 ENCOUNTER — Ambulatory Visit: Payer: BLUE CROSS/BLUE SHIELD

## 2013-09-15 DIAGNOSIS — Z853 Personal history of malignant neoplasm of breast: Secondary | ICD-10-CM

## 2013-09-15 MED ORDER — RADIAPLEXRX EX GEL
Freq: Once | CUTANEOUS | Status: AC
  Administered 2013-09-15: 1 via TOPICAL

## 2013-09-15 NOTE — Progress Notes (Signed)
Routine of clinic reviewed.Patient knows that regular doctor day is after radiation treatment on Tuesday.Skin care sheet provided as well as Radiation Therapy Booklet.Given radiaplex.possible side effects to include skin discoloration, peeling , tenderness and swelling as well as fatigue.

## 2013-09-16 ENCOUNTER — Ambulatory Visit
Admission: RE | Admit: 2013-09-16 | Discharge: 2013-09-16 | Disposition: A | Discharge status: Home / Self Care | Payer: BLUE CROSS/BLUE SHIELD | Source: Ambulatory Visit | Attending: Radiation Oncology | Admitting: Radiation Oncology

## 2013-09-16 ENCOUNTER — Ambulatory Visit: Payer: BLUE CROSS/BLUE SHIELD

## 2013-09-16 NOTE — Progress Notes (Signed)
Patient dropped off FMLA papers late on 09/15/13 1 set for herself and the other for her spouse.

## 2013-09-19 ENCOUNTER — Ambulatory Visit
Admission: RE | Admit: 2013-09-19 | Discharge: 2013-09-19 | Disposition: A | Discharge status: Home / Self Care | Payer: BLUE CROSS/BLUE SHIELD | Source: Ambulatory Visit | Attending: Radiation Oncology | Admitting: Radiation Oncology

## 2013-09-19 ENCOUNTER — Ambulatory Visit: Payer: BLUE CROSS/BLUE SHIELD

## 2013-09-20 ENCOUNTER — Ambulatory Visit
Admission: RE | Admit: 2013-09-20 | Discharge: 2013-09-20 | Disposition: A | Discharge status: Home / Self Care | Payer: BLUE CROSS/BLUE SHIELD | Source: Ambulatory Visit | Attending: Radiation Oncology | Admitting: Radiation Oncology

## 2013-09-20 ENCOUNTER — Ambulatory Visit: Admission: RE | Admit: 2013-09-20 | Payer: BLUE CROSS/BLUE SHIELD | Source: Ambulatory Visit

## 2013-09-20 VITALS — BP 139/65 | HR 92 | Temp 98.8°F | Wt 203.6 lb

## 2013-09-20 DIAGNOSIS — C50919 Malignant neoplasm of unspecified site of unspecified female breast: Secondary | ICD-10-CM

## 2013-09-20 NOTE — Progress Notes (Signed)
Patient here for weekly assessment of radiation to left chest wall.Completed 4 treatments.No visible changes.Has some swelling of left arm.Gave calendar of events of [place] for March 2015.Reviewed some arm exercises.Dr.Wentworth to complete fmla papers today.Patient education completed last week.

## 2013-09-20 NOTE — Progress Notes (Signed)
Weekly Management Note Current Dose:  7.2 Gy  Projected Dose: 61 Gy   Narrative:  The patient presents for routine under treatment assessment.  CBCT/MVCT images/Port film x-rays were reviewed.  The chart was checked. Doing well. Some left arm swelling. Started after surgery and now owrse. No PT eval yet. Needs FMLA paperwork renewed for herself and her husband.   Physical Findings: Weight: 203 lb 9.6 oz (92.352 kg). Left hand is swollen up to elbow. No skin changes.  Impression:  The patient is tolerating radiation.  Plan:  Continue treatment as planned. PT referral. Continue radiaplex.

## 2013-09-21 ENCOUNTER — Ambulatory Visit
Admission: RE | Admit: 2013-09-21 | Discharge: 2013-09-21 | Disposition: A | Discharge status: Home / Self Care | Payer: BLUE CROSS/BLUE SHIELD | Source: Ambulatory Visit | Attending: Radiation Oncology | Admitting: Radiation Oncology

## 2013-09-21 ENCOUNTER — Other Ambulatory Visit: Payer: Self-pay | Admitting: *Deleted

## 2013-09-21 ENCOUNTER — Telehealth: Payer: Self-pay | Admitting: *Deleted

## 2013-09-21 ENCOUNTER — Ambulatory Visit: Payer: BLUE CROSS/BLUE SHIELD

## 2013-09-21 DIAGNOSIS — C50919 Malignant neoplasm of unspecified site of unspecified female breast: Secondary | ICD-10-CM

## 2013-09-21 NOTE — Telephone Encounter (Signed)
Called patient to inform of PT appt. For 09-26-13 @ 3:15 pm, lvm for a return call

## 2013-09-22 ENCOUNTER — Ambulatory Visit (HOSPITAL_BASED_OUTPATIENT_CLINIC_OR_DEPARTMENT_OTHER): Payer: BC Managed Care – PPO | Admitting: Oncology

## 2013-09-22 ENCOUNTER — Encounter: Payer: Self-pay | Admitting: Oncology

## 2013-09-22 ENCOUNTER — Other Ambulatory Visit (HOSPITAL_BASED_OUTPATIENT_CLINIC_OR_DEPARTMENT_OTHER): Payer: BC Managed Care – PPO

## 2013-09-22 ENCOUNTER — Ambulatory Visit
Admission: RE | Admit: 2013-09-22 | Discharge: 2013-09-22 | Disposition: A | Discharge status: Home / Self Care | Payer: BLUE CROSS/BLUE SHIELD | Source: Ambulatory Visit | Attending: Radiation Oncology | Admitting: Radiation Oncology

## 2013-09-22 ENCOUNTER — Encounter: Payer: Self-pay | Admitting: Radiation Oncology

## 2013-09-22 ENCOUNTER — Ambulatory Visit: Payer: BLUE CROSS/BLUE SHIELD

## 2013-09-22 VITALS — BP 120/79 | HR 103 | Temp 97.8°F | Resp 20 | Ht 64.0 in | Wt 207.2 lb

## 2013-09-22 DIAGNOSIS — C773 Secondary and unspecified malignant neoplasm of axilla and upper limb lymph nodes: Secondary | ICD-10-CM

## 2013-09-22 DIAGNOSIS — Z853 Personal history of malignant neoplasm of breast: Secondary | ICD-10-CM

## 2013-09-22 DIAGNOSIS — C50919 Malignant neoplasm of unspecified site of unspecified female breast: Secondary | ICD-10-CM

## 2013-09-22 DIAGNOSIS — Z171 Estrogen receptor negative status [ER-]: Secondary | ICD-10-CM

## 2013-09-22 LAB — CBC WITH DIFFERENTIAL/PLATELET
BASO%: 0.2 % (ref 0.0–2.0)
BASOS ABS: 0 10*3/uL (ref 0.0–0.1)
EOS ABS: 0.1 10*3/uL (ref 0.0–0.5)
EOS%: 1.3 % (ref 0.0–7.0)
HEMATOCRIT: 36.6 % (ref 34.8–46.6)
HEMOGLOBIN: 12.2 g/dL (ref 11.6–15.9)
LYMPH#: 1.2 10*3/uL (ref 0.9–3.3)
LYMPH%: 28.1 % (ref 14.0–49.7)
MCH: 31.7 pg (ref 25.1–34.0)
MCHC: 33.2 g/dL (ref 31.5–36.0)
MCV: 95.4 fL (ref 79.5–101.0)
MONO#: 0.5 10*3/uL (ref 0.1–0.9)
MONO%: 12.1 % (ref 0.0–14.0)
NEUT#: 2.6 10*3/uL (ref 1.5–6.5)
NEUT%: 58.3 % (ref 38.4–76.8)
PLATELETS: 234 10*3/uL (ref 145–400)
RBC: 3.84 10*6/uL (ref 3.70–5.45)
RDW: 13.4 % (ref 11.2–14.5)
WBC: 4.4 10*3/uL (ref 3.9–10.3)

## 2013-09-22 LAB — COMPREHENSIVE METABOLIC PANEL (CC13)
ALT: 16 U/L (ref 0–55)
ANION GAP: 10 meq/L (ref 3–11)
AST: 19 U/L (ref 5–34)
Albumin: 4.3 g/dL (ref 3.5–5.0)
Alkaline Phosphatase: 104 U/L (ref 40–150)
BILIRUBIN TOTAL: 0.31 mg/dL (ref 0.20–1.20)
BUN: 12.2 mg/dL (ref 7.0–26.0)
CHLORIDE: 104 meq/L (ref 98–109)
CO2: 29 meq/L (ref 22–29)
CREATININE: 0.8 mg/dL (ref 0.6–1.1)
Calcium: 10.5 mg/dL — ABNORMAL HIGH (ref 8.4–10.4)
GLUCOSE: 118 mg/dL (ref 70–140)
Potassium: 3.4 mEq/L — ABNORMAL LOW (ref 3.5–5.1)
Sodium: 144 mEq/L (ref 136–145)
Total Protein: 7.4 g/dL (ref 6.4–8.3)

## 2013-09-22 NOTE — Progress Notes (Signed)
[place]  Telephone:([phone] Fax:(336) [phone]  OFFICE PROGRESS NOTE  ID: [name]   DOB: 12/03/1955  MR#: [id number]  FTD#:[id number]   PCP: Georgetta Haber, MD SU:  Erroll Luna, M.D.   DIAGNOSIS: Christine [name] is a 58 y.o.  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   PRIOR THERAPY: 1. Patient has history of right breast cancer patient underwent a mastectomy when she was 58 years old. This occurred during her pregnancy. After that patient did not take any kind of further treatments. She continued to do well.   2. She recently noticed her left nipple had become itchy and retracted.  She underwent mammogram with ultrasound that showed 2 areas of concern in the left breast (at the 12 o'clock position and 3 o'clock position).  An ultrasound performed that showed the diameter to be at least 6 cm and multiple left axillary lymph nodes that were abnormal. A left breast needle core biopsy performed on 09/30/2012 at the 2 o'clock position showed invasive ductal carcinoma grade 3, estrogen receptor negative, progesterone receptor negative,proliferation marker Ki-67 21%,  HER-2/neu negative.  Left breast needle core biopsy at the 3 o'clock position showed grade 3 ductal carcinoma in situ.  3.  Bilateral breast MRI on 10/12/2012 showed a previous right breast mastectomy and marked diffuse enhancement involving majority of the left breast measuring 13.0 x 12.5 x 9.5 cm. There was diffuse skin thickening and dermal enhancement. Multiple enlarged level I left axillary lymph nodes. The largest lymph node measured 2.4 cm in size. The large area of enhancement within the left breast extends posteriorly to abut the anterior portion of the pectoralis muscle. There was no evidence of internal  mammary adenopathy or right axillary adenopathy.   4. PET/CT did show the left breast mass, and left axillary and subpectoralis nodal metastases, but no further areas throughout the chest/abd/pelvis of metastases.   5.  Patient received 6 cycles of neoadjuvant chemotherapy consisting of FEC (5-FU/epirubicin/Cytoxan) from 11/02/2012 through 01/25/2013 with Neulasta support.  This was followed by 8 cycles of weekly Taxol/Carbo that started from 02/08/2013 through 04/19/2013.  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  She began neoadjuvant chemotherapy consisting of Gemzar/Carboplatin on 05/24/2013 with day 2 Neulasta support.   Gemzar/carboplatinum completed 06/06/2013 for a total of 2 cycles..  #6 status post left mastectomy 07/19/2013. Pathology revealed: Diagnosis Breast, modified radical mastectomy , Left - INVASIVE GRADE III DUCTAL CARCNIOMA, SPANNING 3.9 CM IN GREATEST DIMENSION. - NEOADJUVANT CHANGES ARE PRESENT IN BREAST PARENCHYMA. - MARGINS ARE NEGATIVE. - NINE OF THIRTEEN LYMPH NODES ARE POSITIVE FOR METASTATIC DUCTAL CARCINOMA (9/13). - EXTRACAPSULAR EXTENSION IS PRESENT. - SEE ONCOLOGY TEMPLATE AND COMMENT.  CURRENT THERAPY: radiation therapy with concurrent xeloda  INTERVAL HISTORY: Christine [name] is a  58 y.o. female for followup visit. Patient is doing well. She has begun radiation therapy. She is also taking XELODA. She is tolerating Xeloda very nicely. Denies any fevers, chills or rigors. No headaches. Remainder of the 10 point ROS is negative.  MEDICAL HISTORY: Past Medical History  Diagnosis Date  . ALLERGIC RHINITIS   . GERD (gastroesophageal reflux disease)   . Hypertension   . Hypothyroidism   . Hx of colonic polyps   . Wears glasses   .  Breast cancer 67, age 67    right  . Breast cancer 09/30/12    left, ER/PR -, Her 2 -    ALLERGIES:   Allergies  Allergen  Reactions  . Sulfa Antibiotics Hives, Itching and Swelling  . Sulfonamide Derivatives Hives, Itching and Swelling     MEDICATIONS:  Current Outpatient Prescriptions  Medication Sig Dispense Refill  . capecitabine (XELODA) 500 MG tablet 1000 mg po twice a day concurrent with radiation therapy  120 tablet  3  . diltiazem (CARDIZEM CD) 240 MG 24 hr capsule TAKE ONE CAPSULE BY MOUTH EVERY DAY  90 capsule  3  . fluticasone (FLONASE) 50 MCG/ACT nasal spray Place 2 sprays into the nose daily.  16 g  2  . gabapentin (NEURONTIN) 300 MG capsule Take 1 capsule (300 mg total) by mouth 3 (three) times daily. 3 month supply  270 capsule  0  . latanoprost (XALATAN) 0.005 % ophthalmic solution Place 1 drop into both eyes at bedtime.       Marland Kitchen levothyroxine (SYNTHROID, LEVOTHROID) 50 MCG tablet TAKE 1 TABLET BY MOUTH EVERY DAY  90 tablet  2  . LORazepam (ATIVAN) 0.5 MG tablet Take 1 tablet (0.5 mg total) by mouth every 8 (eight) hours.  30 tablet  0  . losartan-hydrochlorothiazide (HYZAAR) 50-12.5 MG per tablet TAKE 1 TABLET BY MOUTH DAILY.  90 tablet  3  . NEXIUM 40 MG capsule TAKE 1 CAPSULE (40 MG TOTAL) BY MOUTH DAILY BEFORE BREAKFAST.  90 capsule  1  . potassium chloride SA (K-DUR,KLOR-CON) 20 MEQ tablet Take 1 tablet (20 mEq total) by mouth 3 (three) times daily.  90 tablet  1   No current facility-administered medications for this visit.    SURGICAL HISTORY:  Past Surgical History  Procedure Laterality Date  . Tonsillectomy    . Caesarean section      x 1  . Mastectomy  04/1987    right side  . Abdominal hysterectomy  06/1988    fibroids  . Portacath placement Right 10/28/2012    Procedure: PORT PLACEMENT;  Surgeon: Joyice Faster. Cornett, MD;  Location: [facility];  Service: General;  Laterality: Right;  Right Subclavian Vein  . Mastectomy modified radical Left 07/19/2013    Procedure: MASTECTOMY MODIFIED RADICAL;  Surgeon: Marcello Moores A. Cornett, MD;  Location: [facility];   Service: General;  Laterality: Left;  . Port-a-cath removal Right 07/19/2013    Procedure: REMOVAL PORT-A-CATH;  Surgeon: Joyice Faster. Cornett, MD;  Location: [facility];  Service: General;  Laterality: Right;  . Breast surgery Left 07/19/13    MRM    REVIEW OF SYSTEMS:  A 10 point review of systems was conducted and is otherwise negative except for what is noted above.      PHYSICAL EXAMINATION: Blood pressure 120/79, pulse 103, temperature 97.8 F (36.6 C), temperature source Oral, resp. rate 20, height $RemoveBe'5\' 4"'yqbdwPHWo$  (1.626 m), weight 207 lb 3.2 oz (93.985 kg). Body mass index is 35.55 kg/(m^2). General: Patient is a well appearing female in no acute distress HEENT: PERRLA, sclerae anicteric no conjunctival pallor, MMM Neck: supple, no palpable adenopathy Lungs: clear to auscultation bilaterally, no wheezes, rhonchi, or rales Cardiovascular: regular rate rhythm, S1, S2, no murmurs, rubs or gallops Abdomen: Soft, non-tender, non-distended, normoactive bowel sounds, no HSM Extremities: warm and well perfused, no clubbing, cyanosis, or edema Skin: No rashes or lesions Neuro: Non-focal Breasts: Left mastectomy site looks well-healed  ECOG PERFORMANCE STATUS: 1 -  Symptomatic but completely ambulatory     LABORATORY DATA: Lab Results  Component Value Date   WBC 4.4 09/22/2013   HGB 12.2 09/22/2013   HCT 36.6 09/22/2013   MCV 95.4 09/22/2013   PLT 234 09/22/2013      Chemistry      Component Value Date/Time   NA 145 08/18/2013 1325   NA 144 10/28/2012 1248   K 3.4* 08/18/2013 1325   K 3.9 10/28/2012 1248   CL 101 01/25/2013 0952   CL 108 10/28/2012 1248   CO2 28 08/18/2013 1325   CO2 29 09/13/2011 1910   BUN 10.7 08/18/2013 1325   BUN 10 10/28/2012 1248   CREATININE 0.8 08/18/2013 1325   CREATININE 0.80 10/28/2012 1248      Component Value Date/Time   CALCIUM 9.9 08/18/2013 1325   CALCIUM 9.4 09/13/2011 1910   ALKPHOS 95 08/18/2013 1325   ALKPHOS 78 09/13/2011 1910   AST 20  08/18/2013 1325   AST 18 09/13/2011 1910   ALT 14 08/18/2013 1325   ALT 12 09/13/2011 1910   BILITOT 0.36 08/18/2013 1325   BILITOT 0.3 09/13/2011 1910       RADIOGRAPHIC STUDIES: No results found.    ASSESSMENT:   Christine [name] is a 58 y.o. female with:   #1 History of breast cancer of the right breast status post mastectomy when she was 58 years old. Due to this she was referred for genetic testing.   #05 September 2012:  left nipple had become itchy and retracted.  She underwent mammogram with ultrasound that showed 2 areas of concern in the left breast (at the 12 o'clock position and 3 o'clock position).  An ultrasound performed that showed the diameter to be at least 6 cm and multiple left axillary lymph nodes that were abnormal. A left breast needle core biopsy performed on 09/30/2012 at the 2 o'clock position showed invasive ductal carcinoma grade 3, estrogen receptor negative, progesterone receptor negative,proliferation marker Ki-67 21%,  HER-2/neu negative.  Left breast needle core biopsy at the 3 o'clock position showed grade 3 ductal carcinoma in situ.  #3  Bilateral breast MRI on 10/12/2012 showed a previous right breast mastectomy and marked diffuse enhancement involving majority of the left breast measuring 13.0 x 12.5 x 9.5 cm. There was diffuse skin thickening and dermal enhancement. Multiple enlarged level I left axillary lymph nodes. The largest lymph node measured 2.4 cm in size. The large area of enhancement within the left breast extends posteriorly to abut the anterior portion of the pectoralis muscle. There was no evidence of internal mammary adenopathy or right axillary adenopathy.   #4. PET/CT did show the left breast mass, and left axillary and subpectoralis nodal metastases, but no further areas throughout the chest/abd/pelvis of metastases.  She then went on to receive a 2-D echocardiogram, Port-A-Cath placement, and attended chemotherapy class.   #5   S/P 6 cycles of  neoadjuvant chemotherapy consisting of FEC (5-FU/epirubicin/Cytoxan) from 11/02/2012 through 01/25/2013 with Neulasta support.  This was followed by 8 cycles of weekly Taxol/Carbo that started from 02/08/2013 through 04/19/2013.  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  She began neoadjuvant chemotherapy consisting of Gemzar/Carboplatin on 05/24/2013 with day 2 Neulasta support.   She received 2 cycles of this.   #6  status post mastectomy on 07/19/2013. Pathology as above.  PLAN:  1. Receiving post mastectomy radiation therapy with concurrent xeloda. Tolerating it well.  2. Patient will be seen back in 4 weeks for follow up.  All questions answered.  Ms. Carpino was encouraged to contact us in the interim with any questions, concerns, or problems.  I spent 15 minutes counseling the patient face to face.  The total time spent in the appointment was 20 minutes.   Marcy Panning, MD Medical/Oncology [facility] [phone] (beeper) [phone] (Office)

## 2013-09-22 NOTE — Progress Notes (Signed)
Rec'd FMLA paperwork, disability paperwork back from physician.  Faxed FMLA paperwork to [phone] and mailed disability paperwork in envelope provided to [employer]

## 2013-09-23 ENCOUNTER — Ambulatory Visit: Payer: BLUE CROSS/BLUE SHIELD

## 2013-09-23 ENCOUNTER — Ambulatory Visit
Admission: RE | Admit: 2013-09-23 | Discharge: 2013-09-23 | Disposition: A | Discharge status: Home / Self Care | Payer: BLUE CROSS/BLUE SHIELD | Source: Ambulatory Visit | Attending: Radiation Oncology | Admitting: Radiation Oncology

## 2013-09-23 ENCOUNTER — Telehealth: Payer: Self-pay | Admitting: *Deleted

## 2013-09-23 NOTE — Telephone Encounter (Signed)
appts made and printed...td 

## 2013-09-26 ENCOUNTER — Ambulatory Visit
Admission: RE | Admit: 2013-09-26 | Discharge: 2013-09-26 | Disposition: A | Discharge status: Home / Self Care | Payer: BLUE CROSS/BLUE SHIELD | Source: Ambulatory Visit | Attending: Radiation Oncology | Admitting: Radiation Oncology

## 2013-09-26 ENCOUNTER — Ambulatory Visit: Payer: BLUE CROSS/BLUE SHIELD

## 2013-09-26 ENCOUNTER — Ambulatory Visit: Payer: BC Managed Care – PPO | Attending: Radiation Oncology | Admitting: Physical Therapy

## 2013-09-26 DIAGNOSIS — Z853 Personal history of malignant neoplasm of breast: Secondary | ICD-10-CM | POA: Insufficient documentation

## 2013-09-26 DIAGNOSIS — E039 Hypothyroidism, unspecified: Secondary | ICD-10-CM | POA: Insufficient documentation

## 2013-09-26 DIAGNOSIS — H409 Unspecified glaucoma: Secondary | ICD-10-CM | POA: Insufficient documentation

## 2013-09-26 DIAGNOSIS — IMO0001 Reserved for inherently not codable concepts without codable children: Secondary | ICD-10-CM | POA: Insufficient documentation

## 2013-09-26 DIAGNOSIS — I890 Lymphedema, not elsewhere classified: Secondary | ICD-10-CM | POA: Insufficient documentation

## 2013-09-26 DIAGNOSIS — M24519 Contracture, unspecified shoulder: Secondary | ICD-10-CM | POA: Insufficient documentation

## 2013-09-26 DIAGNOSIS — I10 Essential (primary) hypertension: Secondary | ICD-10-CM | POA: Insufficient documentation

## 2013-09-26 DIAGNOSIS — K219 Gastro-esophageal reflux disease without esophagitis: Secondary | ICD-10-CM | POA: Insufficient documentation

## 2013-09-27 ENCOUNTER — Ambulatory Visit: Payer: BLUE CROSS/BLUE SHIELD

## 2013-09-27 ENCOUNTER — Ambulatory Visit: Payer: BC Managed Care – PPO | Admitting: Physical Therapy

## 2013-09-27 ENCOUNTER — Ambulatory Visit: Payer: BLUE CROSS/BLUE SHIELD | Admitting: Radiation Oncology

## 2013-09-28 ENCOUNTER — Ambulatory Visit
Admission: RE | Admit: 2013-09-28 | Discharge: 2013-09-28 | Disposition: A | Discharge status: Home / Self Care | Payer: BLUE CROSS/BLUE SHIELD | Source: Ambulatory Visit | Attending: Radiation Oncology | Admitting: Radiation Oncology

## 2013-09-29 ENCOUNTER — Ambulatory Visit: Payer: BLUE CROSS/BLUE SHIELD

## 2013-09-30 ENCOUNTER — Ambulatory Visit
Admission: RE | Admit: 2013-09-30 | Discharge: 2013-09-30 | Disposition: A | Discharge status: Home / Self Care | Payer: BLUE CROSS/BLUE SHIELD | Source: Ambulatory Visit | Attending: Radiation Oncology | Admitting: Radiation Oncology

## 2013-09-30 ENCOUNTER — Encounter: Payer: Self-pay | Admitting: Radiation Oncology

## 2013-09-30 NOTE — Progress Notes (Signed)
Patient seen in the back, by MD stated she was fine 6:19 PM

## 2013-10-03 ENCOUNTER — Ambulatory Visit
Admission: RE | Admit: 2013-10-03 | Discharge: 2013-10-03 | Disposition: A | Discharge status: Home / Self Care | Payer: BLUE CROSS/BLUE SHIELD | Source: Ambulatory Visit | Attending: Radiation Oncology | Admitting: Radiation Oncology

## 2013-10-04 ENCOUNTER — Ambulatory Visit: Payer: BC Managed Care – PPO | Attending: Radiation Oncology | Admitting: Physical Therapy

## 2013-10-04 ENCOUNTER — Ambulatory Visit: Payer: BLUE CROSS/BLUE SHIELD | Admitting: Radiation Oncology

## 2013-10-04 ENCOUNTER — Ambulatory Visit
Admission: RE | Admit: 2013-10-04 | Discharge: 2013-10-04 | Disposition: A | Discharge status: Home / Self Care | Payer: BLUE CROSS/BLUE SHIELD | Source: Ambulatory Visit | Attending: Radiation Oncology | Admitting: Radiation Oncology

## 2013-10-04 DIAGNOSIS — M24519 Contracture, unspecified shoulder: Secondary | ICD-10-CM | POA: Insufficient documentation

## 2013-10-04 DIAGNOSIS — K219 Gastro-esophageal reflux disease without esophagitis: Secondary | ICD-10-CM | POA: Insufficient documentation

## 2013-10-04 DIAGNOSIS — I890 Lymphedema, not elsewhere classified: Secondary | ICD-10-CM | POA: Insufficient documentation

## 2013-10-04 DIAGNOSIS — H409 Unspecified glaucoma: Secondary | ICD-10-CM | POA: Insufficient documentation

## 2013-10-04 DIAGNOSIS — Z853 Personal history of malignant neoplasm of breast: Secondary | ICD-10-CM | POA: Insufficient documentation

## 2013-10-04 DIAGNOSIS — IMO0001 Reserved for inherently not codable concepts without codable children: Secondary | ICD-10-CM | POA: Insufficient documentation

## 2013-10-04 DIAGNOSIS — I10 Essential (primary) hypertension: Secondary | ICD-10-CM | POA: Insufficient documentation

## 2013-10-04 DIAGNOSIS — E039 Hypothyroidism, unspecified: Secondary | ICD-10-CM | POA: Insufficient documentation

## 2013-10-05 ENCOUNTER — Ambulatory Visit: Payer: BLUE CROSS/BLUE SHIELD

## 2013-10-05 ENCOUNTER — Ambulatory Visit
Admission: RE | Admit: 2013-10-05 | Discharge: 2013-10-05 | Disposition: A | Discharge status: Home / Self Care | Payer: BLUE CROSS/BLUE SHIELD | Source: Ambulatory Visit | Attending: Radiation Oncology | Admitting: Radiation Oncology

## 2013-10-06 ENCOUNTER — Ambulatory Visit: Payer: BC Managed Care – PPO

## 2013-10-06 ENCOUNTER — Ambulatory Visit: Payer: BLUE CROSS/BLUE SHIELD

## 2013-10-06 ENCOUNTER — Ambulatory Visit
Admission: RE | Admit: 2013-10-06 | Discharge: 2013-10-06 | Disposition: A | Discharge status: Home / Self Care | Payer: BLUE CROSS/BLUE SHIELD | Source: Ambulatory Visit | Attending: Radiation Oncology | Admitting: Radiation Oncology

## 2013-10-06 VITALS — BP 136/67 | HR 94 | Temp 98.8°F | Wt 205.3 lb

## 2013-10-06 DIAGNOSIS — C50919 Malignant neoplasm of unspecified site of unspecified female breast: Secondary | ICD-10-CM

## 2013-10-06 NOTE — Progress Notes (Addendum)
Patient for weekly assessment of radiation to left chest wall.Mild discoloration, no peeling.denies pain but does feel burning sensation at times.Generlaized fatigue.Neuropathy of fingers unchanged.tolerating xeloda with minimal side effects.

## 2013-10-06 NOTE — Progress Notes (Signed)
Weekly Management Note Current Dose: 14  Gy  Projected Dose: 60.4 Gy   Narrative:  The patient presents for routine under treatment assessment.  CBCT/MVCT images/Port film x-rays were reviewed.  The chart was checked. Doing well. No side effects from xeloda.  Occasional pain in hands and feet from chemo but better. PT is working with her on lymph edema on the left. She is pleased with this.   Physical Findings: Weight: 205 lb 4.8 oz (93.123 kg). Slightly dark/pink skin over right chest wall.   Impression:  The patient is tolerating radiation.  Plan:  Continue treatment as planned. Continue xeloda and continue radiaplex. Continue PT.

## 2013-10-07 ENCOUNTER — Ambulatory Visit
Admission: RE | Admit: 2013-10-07 | Discharge: 2013-10-07 | Disposition: A | Discharge status: Home / Self Care | Payer: BLUE CROSS/BLUE SHIELD | Source: Ambulatory Visit | Attending: Radiation Oncology | Admitting: Radiation Oncology

## 2013-10-07 ENCOUNTER — Ambulatory Visit: Payer: BLUE CROSS/BLUE SHIELD

## 2013-10-10 ENCOUNTER — Ambulatory Visit: Payer: BLUE CROSS/BLUE SHIELD

## 2013-10-10 ENCOUNTER — Ambulatory Visit
Admission: RE | Admit: 2013-10-10 | Discharge: 2013-10-10 | Disposition: A | Discharge status: Home / Self Care | Payer: BLUE CROSS/BLUE SHIELD | Source: Ambulatory Visit | Attending: Radiation Oncology | Admitting: Radiation Oncology

## 2013-10-11 ENCOUNTER — Ambulatory Visit: Payer: BLUE CROSS/BLUE SHIELD

## 2013-10-11 ENCOUNTER — Encounter: Payer: Self-pay | Admitting: Radiation Oncology

## 2013-10-11 ENCOUNTER — Ambulatory Visit: Payer: BC Managed Care – PPO | Admitting: Physical Therapy

## 2013-10-11 ENCOUNTER — Ambulatory Visit
Admission: RE | Admit: 2013-10-11 | Discharge: 2013-10-11 | Disposition: A | Discharge status: Home / Self Care | Payer: BLUE CROSS/BLUE SHIELD | Source: Ambulatory Visit | Attending: Radiation Oncology | Admitting: Radiation Oncology

## 2013-10-11 VITALS — BP 116/57 | HR 78 | Resp 16 | Wt 203.2 lb

## 2013-10-11 DIAGNOSIS — C50919 Malignant neoplasm of unspecified site of unspecified female breast: Secondary | ICD-10-CM

## 2013-10-11 NOTE — Progress Notes (Signed)
Reports physical therapy is helping with ROM and lymph nodes. Hyperpigmentation of treatment area without desquamation reported. Reports fatigue. Continues to take xeloda.

## 2013-10-11 NOTE — Progress Notes (Signed)
  Radiation Oncology         [phone] ________________________________  Name: Christine [name] MRN: [id number]  Date: 10/11/2013  DOB: 03-05-1956  Weekly Radiation Therapy Management  Current Dose: 30.6 Gy     Planned Dose:  60.4 Gy  Narrative . . . . . . . . The patient presents for routine under treatment assessment.                                   The patient is without complaint.  Physical therapy  seems to be helping with her range of motion and lymphedema.                                 Set-up films were reviewed.                                 The chart was checked. Physical Findings. . .  weight is 203 lb 3.2 oz (92.171 kg). Her blood pressure is 116/57 and her pulse is 78. Her respiration is 16. . Weight essentially stable.  Hyperpigmentation changes noted along the left chest wall area. Impression . . . . . . . The patient is tolerating radiation. Plan . . . . . . . . . . . . Continue treatment as planned.  ________________________________  -----------------------------------  Blair Promise, PhD, MD

## 2013-10-12 ENCOUNTER — Other Ambulatory Visit: Payer: Self-pay | Admitting: Adult Health

## 2013-10-12 ENCOUNTER — Ambulatory Visit: Payer: BLUE CROSS/BLUE SHIELD

## 2013-10-12 ENCOUNTER — Ambulatory Visit
Admission: RE | Admit: 2013-10-12 | Discharge: 2013-10-12 | Disposition: A | Discharge status: Home / Self Care | Payer: BLUE CROSS/BLUE SHIELD | Source: Ambulatory Visit | Attending: Radiation Oncology | Admitting: Radiation Oncology

## 2013-10-13 ENCOUNTER — Ambulatory Visit: Payer: BC Managed Care – PPO

## 2013-10-13 ENCOUNTER — Ambulatory Visit
Admission: RE | Admit: 2013-10-13 | Discharge: 2013-10-13 | Disposition: A | Discharge status: Home / Self Care | Payer: BLUE CROSS/BLUE SHIELD | Source: Ambulatory Visit | Attending: Radiation Oncology | Admitting: Radiation Oncology

## 2013-10-13 ENCOUNTER — Ambulatory Visit: Payer: BLUE CROSS/BLUE SHIELD

## 2013-10-14 ENCOUNTER — Ambulatory Visit
Admission: RE | Admit: 2013-10-14 | Discharge: 2013-10-14 | Disposition: A | Discharge status: Home / Self Care | Payer: BLUE CROSS/BLUE SHIELD | Source: Ambulatory Visit | Attending: Radiation Oncology | Admitting: Radiation Oncology

## 2013-10-14 ENCOUNTER — Other Ambulatory Visit: Payer: Self-pay | Admitting: *Deleted

## 2013-10-14 ENCOUNTER — Ambulatory Visit: Payer: BLUE CROSS/BLUE SHIELD

## 2013-10-14 DIAGNOSIS — E876 Hypokalemia: Secondary | ICD-10-CM

## 2013-10-14 MED ORDER — POTASSIUM CHLORIDE CRYS ER 20 MEQ PO TBCR: 20.0000 meq | EXTENDED_RELEASE_TABLET | Freq: Three times a day (TID) | ORAL | Status: DC

## 2013-10-14 NOTE — Telephone Encounter (Signed)
Josh RPh with [facility] called requesting refill authorization for potassium.

## 2013-10-17 ENCOUNTER — Ambulatory Visit
Admission: RE | Admit: 2013-10-17 | Discharge: 2013-10-17 | Disposition: A | Discharge status: Home / Self Care | Payer: BLUE CROSS/BLUE SHIELD | Source: Ambulatory Visit | Attending: Radiation Oncology | Admitting: Radiation Oncology

## 2013-10-17 ENCOUNTER — Ambulatory Visit: Payer: BLUE CROSS/BLUE SHIELD

## 2013-10-18 ENCOUNTER — Ambulatory Visit: Payer: BLUE CROSS/BLUE SHIELD

## 2013-10-18 ENCOUNTER — Ambulatory Visit: Payer: BLUE CROSS/BLUE SHIELD | Admitting: Radiation Oncology

## 2013-10-18 ENCOUNTER — Ambulatory Visit: Payer: BC Managed Care – PPO | Admitting: Physical Therapy

## 2013-10-18 ENCOUNTER — Ambulatory Visit
Admission: RE | Admit: 2013-10-18 | Discharge: 2013-10-18 | Disposition: A | Discharge status: Home / Self Care | Payer: BLUE CROSS/BLUE SHIELD | Source: Ambulatory Visit | Attending: Radiation Oncology | Admitting: Radiation Oncology

## 2013-10-19 ENCOUNTER — Ambulatory Visit
Admission: RE | Admit: 2013-10-19 | Discharge: 2013-10-19 | Disposition: A | Discharge status: Home / Self Care | Payer: BLUE CROSS/BLUE SHIELD | Source: Ambulatory Visit | Attending: Radiation Oncology | Admitting: Radiation Oncology

## 2013-10-19 ENCOUNTER — Ambulatory Visit: Payer: BLUE CROSS/BLUE SHIELD

## 2013-10-19 ENCOUNTER — Encounter: Payer: Self-pay | Admitting: Radiation Oncology

## 2013-10-19 VITALS — BP 128/79 | HR 88 | Temp 97.5°F | Resp 20 | Wt 204.0 lb

## 2013-10-19 DIAGNOSIS — C50919 Malignant neoplasm of unspecified site of unspecified female breast: Secondary | ICD-10-CM

## 2013-10-19 NOTE — Progress Notes (Signed)
Pt states she has some pain of her left chest wall; she took Aleve w/good relief. She is applying Radiaplex lotion for hyperpigmentation, was given Hydrogel pads to apply to this area. Pt is fatigued, no loss of appetite.

## 2013-10-19 NOTE — Progress Notes (Signed)
Weekly Management Note Current Dose: 41.4  Gy  Projected Dose: 60.4 Gy   Narrative:  The patient presents for routine under treatment assessment.  CBCT/MVCT images/Port film x-rays were reviewed.  The chart was checked. She is doing well. Her skin has become more irritated. She is taking Aleve. She is using Radiaplex. She asked to get something more to help. She is taking her capecitabine as directed. She is pleased with her progress with plastic surgery. Her lymphedema is well controlled.  Physical Findings: Weight: 204 lb (92.534 kg). Dark skin. Moist desquamation in left axilla.  Impression:  The patient is tolerating radiation.  Plan:  Continue treatment as planned. Add hydrogel pads. Anti-inflammatories when necessary.

## 2013-10-20 ENCOUNTER — Ambulatory Visit
Admission: RE | Admit: 2013-10-20 | Discharge: 2013-10-20 | Disposition: A | Discharge status: Home / Self Care | Payer: BLUE CROSS/BLUE SHIELD | Source: Ambulatory Visit | Attending: Radiation Oncology | Admitting: Radiation Oncology

## 2013-10-20 ENCOUNTER — Ambulatory Visit (HOSPITAL_BASED_OUTPATIENT_CLINIC_OR_DEPARTMENT_OTHER): Payer: BC Managed Care – PPO

## 2013-10-20 ENCOUNTER — Ambulatory Visit: Payer: BC Managed Care – PPO

## 2013-10-20 ENCOUNTER — Ambulatory Visit: Payer: BLUE CROSS/BLUE SHIELD

## 2013-10-20 ENCOUNTER — Encounter: Payer: Self-pay | Admitting: Adult Health

## 2013-10-20 ENCOUNTER — Ambulatory Visit (HOSPITAL_BASED_OUTPATIENT_CLINIC_OR_DEPARTMENT_OTHER): Payer: BC Managed Care – PPO | Admitting: Adult Health

## 2013-10-20 VITALS — BP 125/77 | HR 94 | Temp 98.1°F | Resp 18 | Ht 64.0 in | Wt 209.5 lb

## 2013-10-20 DIAGNOSIS — C50919 Malignant neoplasm of unspecified site of unspecified female breast: Secondary | ICD-10-CM

## 2013-10-20 DIAGNOSIS — C773 Secondary and unspecified malignant neoplasm of axilla and upper limb lymph nodes: Secondary | ICD-10-CM

## 2013-10-20 LAB — COMPREHENSIVE METABOLIC PANEL (CC13)
ALT: 14 U/L (ref 0–55)
AST: 24 U/L (ref 5–34)
Albumin: 4.3 g/dL (ref 3.5–5.0)
Alkaline Phosphatase: 106 U/L (ref 40–150)
Anion Gap: 12 mEq/L — ABNORMAL HIGH (ref 3–11)
BUN: 9.4 mg/dL (ref 7.0–26.0)
CALCIUM: 9.9 mg/dL (ref 8.4–10.4)
CO2: 27 meq/L (ref 22–29)
Chloride: 103 mEq/L (ref 98–109)
Creatinine: 0.8 mg/dL (ref 0.6–1.1)
Glucose: 108 mg/dl (ref 70–140)
POTASSIUM: 3.4 meq/L — AB (ref 3.5–5.1)
Sodium: 143 mEq/L (ref 136–145)
Total Bilirubin: 0.46 mg/dL (ref 0.20–1.20)
Total Protein: 7.4 g/dL (ref 6.4–8.3)

## 2013-10-20 LAB — CBC WITH DIFFERENTIAL/PLATELET
BASO%: 0.1 % (ref 0.0–2.0)
BASOS ABS: 0 10*3/uL (ref 0.0–0.1)
EOS ABS: 0.1 10*3/uL (ref 0.0–0.5)
EOS%: 2 % (ref 0.0–7.0)
HCT: 34.2 % — ABNORMAL LOW (ref 34.8–46.6)
HEMOGLOBIN: 11.4 g/dL — AB (ref 11.6–15.9)
LYMPH#: 0.6 10*3/uL — AB (ref 0.9–3.3)
LYMPH%: 16 % (ref 14.0–49.7)
MCH: 32 pg (ref 25.1–34.0)
MCHC: 33.4 g/dL (ref 31.5–36.0)
MCV: 95.7 fL (ref 79.5–101.0)
MONO#: 0.5 10*3/uL (ref 0.1–0.9)
MONO%: 14.3 % — ABNORMAL HIGH (ref 0.0–14.0)
NEUT%: 67.6 % (ref 38.4–76.8)
NEUTROS ABS: 2.5 10*3/uL (ref 1.5–6.5)
Platelets: 163 10*3/uL (ref 145–400)
RBC: 3.57 10*6/uL — ABNORMAL LOW (ref 3.70–5.45)
RDW: 15.9 % — AB (ref 11.2–14.5)
WBC: 3.7 10*3/uL — ABNORMAL LOW (ref 3.9–10.3)

## 2013-10-20 MED ORDER — LORAZEPAM 0.5 MG PO TABS: 0.5000 mg | ORAL_TABLET | Freq: Three times a day (TID) | ORAL | Status: DC

## 2013-10-20 NOTE — Progress Notes (Signed)
[place]  [phone]) [phone] Fax:(336) [phone]  OFFICE PROGRESS NOTE  ID: Christine [name]   DOB: Jul 18, 1956  MR#: [id number]  JYN#:[id number]   PCP: Georgetta Haber, MD SU:  Erroll Luna, M.D.   DIAGNOSIS: Christine [name] is a 58 y.o.  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 Patient has history of right breast cancer patient underwent a mastectomy when she was 58 years old. This occurred during her pregnancy. After that patient did not take any kind of further treatments. She continued to do well.   2. The patient noticed that her left nipple had become itchy and retracted.  She underwent mammogram with ultrasound that showed 2 areas of concern in the left breast (at the 12 o'clock position and 3 o'clock position).  An ultrasound performed that showed the diameter to be at least 6 cm and multiple left axillary lymph nodes that were abnormal. A left breast needle core biopsy performed on 09/30/2012 at the 2 o'clock position showed invasive ductal carcinoma grade 3, estrogen receptor negative, progesterone receptor negative,proliferation marker Ki-67 21%,  HER-2/neu negative.  Left breast needle core biopsy at the 3 o'clock position showed grade 3 ductal carcinoma in situ.  3.  Bilateral breast MRI on 10/12/2012 showed a previous right breast mastectomy and marked diffuse enhancement involving majority of the left breast measuring 13.0 x 12.5 x 9.5 cm. There was diffuse skin thickening and dermal enhancement. Multiple enlarged level I left axillary lymph nodes. The largest lymph node measured 2.4 cm in size. The large area of enhancement within the left breast extends posteriorly to abut the anterior portion of the pectoralis muscle. There was no evidence of internal  mammary adenopathy or right axillary adenopathy.   4. PET/CT did show the left breast mass, and left axillary and subpectoralis nodal metastases, but no further areas throughout the chest/abd/pelvis of metastases.   5.  Patient received 6 cycles of neoadjuvant chemotherapy consisting of FEC (5-FU/epirubicin/Cytoxan) from 11/02/2012 through 01/25/2013 with Neulasta support.  This was followed by 8 cycles of weekly Taxol/Carbo that started from 02/08/2013 through 04/19/2013.  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  She began neoadjuvant chemotherapy consisting of Gemzar/Carboplatin on 05/24/2013 with day 2 Neulasta support.   Gemzar/carboplatinum completed 06/06/2013 for a total of 2 cycles.  #6 status post left mastectomy 07/19/2013. Pathology revealed: Diagnosis Breast, modified radical mastectomy , Left - INVASIVE GRADE III DUCTAL CARCNIOMA, SPANNING 3.9 CM IN GREATEST DIMENSION. - NEOADJUVANT CHANGES ARE PRESENT IN BREAST PARENCHYMA. - MARGINS ARE NEGATIVE. - NINE OF THIRTEEN LYMPH NODES ARE POSITIVE FOR METASTATIC DUCTAL CARCINOMA (9/13). - EXTRACAPSULAR EXTENSION IS PRESENT. - SEE ONCOLOGY TEMPLATE AND COMMENT.  CURRENT THERAPY: radiation therapy with concurrent xeloda  INTERVAL HISTORY: Christine [name] is a  58 y.o. female with left breast invasive ductal carcinoma who returns for labs and evaluation while undergoing radiation therapy with concurrent Xeloda.  She is taking the Xeloda as prescribed and denies any difficulties with it.  She is tolerating radiation therapy relatively well and does have some signs of early desquamation.  She denies fevers, chills, nausea, vomiting, constipation, diarrhea, mouth pain/mucositis, hand foot syndrome or any other concerns. A 10 point ROS is negative.   MEDICAL HISTORY: Past Medical History  Diagnosis Date  . ALLERGIC RHINITIS   . GERD (gastroesophageal  reflux disease)   . Hypertension   . Hypothyroidism   . Hx of colonic polyps   . Wears glasses   . Breast cancer 67, age 40    right  . Breast cancer 09/30/12    left, ER/PR -, Her 2 -    ALLERGIES:   Allergies  Allergen Reactions  . Sulfa Antibiotics Hives, Itching and Swelling  . Sulfonamide Derivatives Hives, Itching and Swelling     MEDICATIONS:  Current Outpatient Prescriptions  Medication Sig Dispense Refill  . capecitabine (XELODA) 500 MG tablet 1000 mg po twice a day concurrent with radiation therapy  120 tablet  3  . diltiazem (CARDIZEM CD) 240 MG 24 hr capsule TAKE ONE CAPSULE BY MOUTH EVERY DAY  90 capsule  3  . fluticasone (FLONASE) 50 MCG/ACT nasal spray Place 2 sprays into the nose daily.  16 g  2  . gabapentin (NEURONTIN) 300 MG capsule Take 300 mg by mouth every morning. 3 month supply      . latanoprost (XALATAN) 0.005 % ophthalmic solution Place 1 drop into both eyes at bedtime.       Marland Kitchen levothyroxine (SYNTHROID, LEVOTHROID) 50 MCG tablet TAKE 1 TABLET BY MOUTH EVERY DAY  90 tablet  2  . LORazepam (ATIVAN) 0.5 MG tablet Take 1 tablet (0.5 mg total) by mouth every 8 (eight) hours.  30 tablet  0  . losartan-hydrochlorothiazide (HYZAAR) 50-12.5 MG per tablet TAKE 1 TABLET BY MOUTH DAILY.  90 tablet  3  . NEXIUM 40 MG capsule TAKE 1 CAPSULE (40 MG TOTAL) BY MOUTH DAILY BEFORE BREAKFAST.  90 capsule  1  . potassium chloride SA (K-DUR,KLOR-CON) 20 MEQ tablet Take 1 tablet (20 mEq total) by mouth 3 (three) times daily.  90 tablet  0  . ondansetron (ZOFRAN) 8 MG tablet Take 8 mg by mouth 2 (two) times daily.      . prochlorperazine (COMPAZINE) 10 MG tablet Take 10 mg by mouth every 6 (six) hours as needed for nausea or vomiting.      . prochlorperazine (COMPAZINE) 25 MG suppository Place 25 mg rectally every 12 (twelve) hours as needed for nausea or vomiting.       No current facility-administered medications for this visit.    SURGICAL HISTORY:  Past Surgical  History  Procedure Laterality Date  . Tonsillectomy    . Caesarean section      x 1  . Mastectomy  04/1987    right side  . Abdominal hysterectomy  06/1988    fibroids  . Portacath placement Right 10/28/2012    Procedure: PORT PLACEMENT;  Surgeon: Joyice Faster. Cornett, MD;  Location: [facility];  Service: General;  Laterality: Right;  Right Subclavian Vein  . Mastectomy modified radical Left 07/19/2013    Procedure: MASTECTOMY MODIFIED RADICAL;  Surgeon: Marcello Moores A. Cornett, MD;  Location: [facility];  Service: General;  Laterality: Left;  . Port-a-cath removal Right 07/19/2013    Procedure: REMOVAL PORT-A-CATH;  Surgeon: Joyice Faster. Cornett, MD;  Location: [facility];  Service: General;  Laterality: Right;  . Breast surgery Left 07/19/13    MRM    REVIEW OF SYSTEMS:  A 10 point review of systems was conducted and is otherwise negative except for what is noted above.      PHYSICAL EXAMINATION: Blood pressure 125/77, pulse 94, temperature 98.1 F (36.7 C), temperature source Oral, resp. rate  18, height $RemoveBe'5\' 4"'IFTUEDafy$  (1.626 m), weight 209 lb 8 oz (95.029 kg). Body mass index is 35.94 kg/(m^2). GENERAL: Patient is a well appearing female in no acute distress HEENT:  Sclerae anicteric.  Oropharynx clear and moist. No ulcerations or evidence of oropharyngeal candidiasis. Neck is supple.  NODES:  No cervical, supraclavicular, or axillary lymphadenopathy palpated.  BREAST EXAM:  Left chest wall is hyperpigmented with 3 small pinpoint areas of blistering.   LUNGS:  Clear to auscultation bilaterally.  No wheezes or rhonchi. HEART:  Regular rate and rhythm. No murmur appreciated. ABDOMEN:  Soft, nontender.  Positive, normoactive bowel sounds. No organomegaly palpated. MSK:  No focal spinal tenderness to palpation. Full range of motion bilaterally in the upper extremities. EXTREMITIES:  No peripheral edema.   SKIN:  Clear with no obvious rashes or skin changes. No  nail dyscrasia. NEURO:  Nonfocal. Well oriented.  Appropriate affect. ECOG PERFORMANCE STATUS: 1 - Symptomatic but completely ambulatory     LABORATORY DATA: Lab Results  Component Value Date   WBC 3.7* 10/20/2013   HGB 11.4* 10/20/2013   HCT 34.2* 10/20/2013   MCV 95.7 10/20/2013   PLT 163 10/20/2013      Chemistry      Component Value Date/Time   NA 143 10/20/2013 1548   NA 144 10/28/2012 1248   K 3.4* 10/20/2013 1548   K 3.9 10/28/2012 1248   CL 101 01/25/2013 0952   CL 108 10/28/2012 1248   CO2 27 10/20/2013 1548   CO2 29 09/13/2011 1910   BUN 9.4 10/20/2013 1548   BUN 10 10/28/2012 1248   CREATININE 0.8 10/20/2013 1548   CREATININE 0.80 10/28/2012 1248      Component Value Date/Time   CALCIUM 9.9 10/20/2013 1548   CALCIUM 9.4 09/13/2011 1910   ALKPHOS 106 10/20/2013 1548   ALKPHOS 78 09/13/2011 1910   AST 24 10/20/2013 1548   AST 18 09/13/2011 1910   ALT 14 10/20/2013 1548   ALT 12 09/13/2011 1910   BILITOT 0.46 10/20/2013 1548   BILITOT 0.3 09/13/2011 1910       RADIOGRAPHIC STUDIES: No results found.    ASSESSMENT:   Christine [name] is a 58 y.o. female with:   #1 History of breast cancer of the right breast status post mastectomy when she was 58 years old. Due to this she was referred for genetic testing.   #05 September 2012:  left nipple had become itchy and retracted.  She underwent mammogram with ultrasound that showed 2 areas of concern in the left breast (at the 12 o'clock position and 3 o'clock position).  An ultrasound performed that showed the diameter to be at least 6 cm and multiple left axillary lymph nodes that were abnormal. A left breast needle core biopsy performed on 09/30/2012 at the 2 o'clock position showed invasive ductal carcinoma grade 3, estrogen receptor negative, progesterone receptor negative,proliferation marker Ki-67 21%,  HER-2/neu negative.  Left breast needle core biopsy at the 3 o'clock position showed grade 3 ductal carcinoma in situ.  #3  Bilateral  breast MRI on 10/12/2012 showed a previous right breast mastectomy and marked diffuse enhancement involving majority of the left breast measuring 13.0 x 12.5 x 9.5 cm. There was diffuse skin thickening and dermal enhancement. Multiple enlarged level I left axillary lymph nodes. The largest lymph node measured 2.4 cm in size. The large area of enhancement within the left breast extends posteriorly to abut the anterior portion of the pectoralis muscle. There  was no evidence of internal mammary adenopathy or right axillary adenopathy.   #4. PET/CT did show the left breast mass, and left axillary and subpectoralis nodal metastases, but no further areas throughout the chest/abd/pelvis of metastases.  She then went on to receive a 2-D echocardiogram, Port-A-Cath placement, and attended chemotherapy class.  #5   S/P 6 cycles of neoadjuvant chemotherapy consisting of FEC (5-FU/epirubicin/Cytoxan) from 11/02/2012 through 01/25/2013 with Neulasta support.  This was followed by 8 cycles of weekly Taxol/Carbo that started from 02/08/2013 through 04/19/2013.  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  She began neoadjuvant chemotherapy consisting of Gemzar/Carboplatin on 05/24/2013 with day 2 Neulasta support.   She received 2 cycles of this.   #6  status post mastectomy on 07/19/2013. Pathology as above.  #7  She began radiation therapy with concurrent Xeloda on 09/15/13.  She is tolerating this relatively well.    PLAN:  1. Mrs. Trammel is doing well today.  She is taking the Xeloda as prescribed and tolerating it well.  She is doing well with her radiation therapy and has begun to apply ointment to her chest wall area that has began to blister.  She will continue the Xeloda until her final radiation appointment. She did not have a lab appointment today, therefore I added one on with a CBC and CMP.    2.  She will return in  four weeks for labs and evaluation.    All questions answered.  Ms. Frein was encouraged to contact us in the interim with any questions, concerns, or problems.  I spent 15 minutes counseling the patient face to face.  The total time spent in the appointment was 30 minutes.   Minette Headland, [place] [phone]

## 2013-10-20 NOTE — Telephone Encounter (Signed)
appts made and printed...td 

## 2013-10-21 ENCOUNTER — Ambulatory Visit
Admission: RE | Admit: 2013-10-21 | Discharge: 2013-10-21 | Disposition: A | Discharge status: Home / Self Care | Payer: BLUE CROSS/BLUE SHIELD | Source: Ambulatory Visit | Attending: Radiation Oncology | Admitting: Radiation Oncology

## 2013-10-21 ENCOUNTER — Encounter: Payer: Self-pay | Admitting: Radiation Oncology

## 2013-10-21 ENCOUNTER — Ambulatory Visit: Payer: BLUE CROSS/BLUE SHIELD

## 2013-10-24 ENCOUNTER — Ambulatory Visit: Payer: BLUE CROSS/BLUE SHIELD

## 2013-10-24 ENCOUNTER — Ambulatory Visit
Admission: RE | Admit: 2013-10-24 | Discharge: 2013-10-24 | Disposition: A | Discharge status: Home / Self Care | Payer: BLUE CROSS/BLUE SHIELD | Source: Ambulatory Visit | Attending: Radiation Oncology | Admitting: Radiation Oncology

## 2013-10-25 ENCOUNTER — Ambulatory Visit: Payer: BLUE CROSS/BLUE SHIELD

## 2013-10-25 ENCOUNTER — Ambulatory Visit
Admission: RE | Admit: 2013-10-25 | Discharge: 2013-10-25 | Disposition: A | Discharge status: Home / Self Care | Payer: BLUE CROSS/BLUE SHIELD | Source: Ambulatory Visit | Attending: Radiation Oncology | Admitting: Radiation Oncology

## 2013-10-25 VITALS — BP 130/56 | HR 96 | Temp 97.8°F | Wt 203.8 lb

## 2013-10-25 DIAGNOSIS — C50919 Malignant neoplasm of unspecified site of unspecified female breast: Secondary | ICD-10-CM

## 2013-10-25 MED ORDER — BIAFINE EX EMUL
CUTANEOUS | Status: DC | PRN
  Administered 2013-10-25: 18:00:00 via TOPICAL

## 2013-10-25 NOTE — Progress Notes (Signed)
Patient for weekly assessment of radiation to left chest wall.Has moist desquamation of left axilla.To apply neosporin 2 to 3 times daily and cover with telfa or hydrogels.Will start trying biafine today.Very tired, sleeping more especially since taking lorazepam 0.5 mg.Generalized achiness.

## 2013-10-25 NOTE — Progress Notes (Signed)
Weekly Management Note Current Dose:  48.6 Gy  Projected Dose: 58.6 Gy   Narrative:  The patient presents for routine under treatment assessment.  CBCT/MVCT images/Port film x-rays were reviewed.  The chart was checked. Broken skin in the axilla.  Some soreness and stretching in the axilla as well.   Physical Findings: Weight: 203 lb 12.8 oz (92.443 kg). Moist desquamation under arm and small area in (former) inframammary fold. Rest of chest wall is dark and dry.   Impression:  The patient is tolerating radiation.  Plan:  Continue treatment as planned. Stop tangents. Move on to boost. Gentian violet applied.

## 2013-10-26 ENCOUNTER — Ambulatory Visit
Admission: RE | Admit: 2013-10-26 | Discharge: 2013-10-26 | Disposition: A | Discharge status: Home / Self Care | Payer: BLUE CROSS/BLUE SHIELD | Source: Ambulatory Visit | Attending: Radiation Oncology | Admitting: Radiation Oncology

## 2013-10-26 ENCOUNTER — Ambulatory Visit: Payer: BLUE CROSS/BLUE SHIELD

## 2013-10-27 ENCOUNTER — Ambulatory Visit: Payer: BLUE CROSS/BLUE SHIELD

## 2013-10-27 ENCOUNTER — Ambulatory Visit
Admission: RE | Admit: 2013-10-27 | Discharge: 2013-10-27 | Disposition: A | Discharge status: Home / Self Care | Payer: BLUE CROSS/BLUE SHIELD | Source: Ambulatory Visit | Attending: Radiation Oncology | Admitting: Radiation Oncology

## 2013-10-27 ENCOUNTER — Ambulatory Visit: Payer: BLUE CROSS/BLUE SHIELD | Admitting: Radiation Oncology

## 2013-10-28 ENCOUNTER — Ambulatory Visit
Admission: RE | Admit: 2013-10-28 | Discharge: 2013-10-28 | Disposition: A | Discharge status: Home / Self Care | Payer: BLUE CROSS/BLUE SHIELD | Source: Ambulatory Visit | Attending: Radiation Oncology | Admitting: Radiation Oncology

## 2013-10-28 ENCOUNTER — Ambulatory Visit: Payer: BLUE CROSS/BLUE SHIELD

## 2013-10-31 ENCOUNTER — Ambulatory Visit: Payer: BLUE CROSS/BLUE SHIELD | Admitting: Radiation Oncology

## 2013-10-31 ENCOUNTER — Ambulatory Visit
Admission: RE | Admit: 2013-10-31 | Discharge: 2013-10-31 | Disposition: A | Discharge status: Home / Self Care | Payer: BLUE CROSS/BLUE SHIELD | Source: Ambulatory Visit | Attending: Radiation Oncology | Admitting: Radiation Oncology

## 2013-10-31 ENCOUNTER — Ambulatory Visit: Payer: BLUE CROSS/BLUE SHIELD

## 2013-11-01 ENCOUNTER — Ambulatory Visit
Admission: RE | Admit: 2013-11-01 | Discharge: 2013-11-01 | Disposition: A | Discharge status: Home / Self Care | Payer: BLUE CROSS/BLUE SHIELD | Source: Ambulatory Visit | Attending: Radiation Oncology | Admitting: Radiation Oncology

## 2013-11-01 ENCOUNTER — Encounter: Payer: Self-pay | Admitting: Radiation Oncology

## 2013-11-01 ENCOUNTER — Ambulatory Visit: Payer: BLUE CROSS/BLUE SHIELD

## 2013-11-01 VITALS — BP 151/98 | HR 98 | Temp 98.2°F | Wt 206.5 lb

## 2013-11-01 DIAGNOSIS — C50919 Malignant neoplasm of unspecified site of unspecified female breast: Secondary | ICD-10-CM

## 2013-11-01 MED ORDER — BIAFINE EX EMUL
CUTANEOUS | Status: DC | PRN
  Administered 2013-11-01: 17:00:00 via TOPICAL

## 2013-11-01 NOTE — Progress Notes (Signed)
Patient completes treatment to left chest wall.skin hyperpigmented with areas of dry and wet desquamation.Applying neosporin to wet open areas and biafine in remaining treatment field.Moderate discomfort and fatigue.Will give hydrogel pads, biafine and foam sponge.

## 2013-11-01 NOTE — Progress Notes (Signed)
   Radiation Oncology         [phone] ________________________________  Name: Christine [name] MRN: [id number]  Date: 11/01/2013  DOB: November 07, 1955  End of Treatment Note  Diagnosis:   T2N3 Left breast cancer     Indication for treatment:  Curative       Radiation treatment dates:   09/15/13-11/01/13  Left chest wall / 48.6 Gray @ 1.8 [place] Cables per fraction x 28 fractions Left Supraclavicular fossa / 45 Gray @1 .8 Gray per fraction x 25 fractions Left PAB / 45 Gy at 1.8 Gray per fraction x 25 fractions Left scar / 10 Gray at [employer] per fraction x 5 fractions  Beams/Energies: Opposed tangents with reduced fields/ 6  And 15 MV photons RAO / 15 MV photons LPA / 6 MV photons En face with 6 MeV electrons  Narrative: The patient tolerated radiation treatment relatively well.   She had mixed moist and dry desquamation    Plan: The patient has completed radiation treatment. The patient will return to radiation oncology clinic for routine followup in one week for a skin check. I advised them to call or return sooner if they have any questions or concerns related to their recovery or treatment.  ------------------------------------------------  Thea Silversmith, MD

## 2013-11-01 NOTE — Addendum Note (Signed)
Encounter addended by: Arlyss Repress, RN on: 11/01/2013  5:35 PM<BR>     Documentation filed: Inpatient MAR, Orders

## 2013-11-01 NOTE — Addendum Note (Signed)
Encounter addended by: Arlyss Repress, RN on: 11/01/2013  5:29 PM<BR>     Documentation filed: Inpatient MAR

## 2013-11-02 ENCOUNTER — Ambulatory Visit: Payer: BLUE CROSS/BLUE SHIELD

## 2013-11-10 ENCOUNTER — Encounter: Payer: Self-pay | Admitting: Radiation Oncology

## 2013-11-10 ENCOUNTER — Ambulatory Visit
Admission: RE | Admit: 2013-11-10 | Discharge: 2013-11-10 | Disposition: A | Discharge status: Home / Self Care | Payer: BC Managed Care – PPO | Source: Ambulatory Visit | Attending: Radiation Oncology | Admitting: Radiation Oncology

## 2013-11-10 VITALS — BP 136/81 | HR 102 | Temp 98.0°F | Ht 64.0 in | Wt 207.9 lb

## 2013-11-10 DIAGNOSIS — C50919 Malignant neoplasm of unspecified site of unspecified female breast: Secondary | ICD-10-CM

## 2013-11-10 HISTORY — DX: Reserved for inherently not codable concepts without codable children: IMO0001

## 2013-11-10 HISTORY — DX: Reserved for concepts with insufficient information to code with codable children: IMO0002

## 2013-11-10 NOTE — Patient Instructions (Signed)
   Calming Down and Gearing Up  A survivor perspective on getting through breast cancer.   Join an Network engineer breast cancer survivor as she shares practical advice on facing the treatment journey. She will provide a comprehensive overview of support programs available through the [employer] and other community resources.  Please register for the next available class by calling [phone] or emailing [email or web address]

## 2013-11-10 NOTE — Progress Notes (Signed)
Christine [name] here today for assessment of her left chest wall S/P radiation therapy on 10/04/13.  Decrease in moistness of the desquamated areas on the right chest wall.  Note beginning of reepithelializing of the area.  Using Biafine an neosporin at home.  Reports occasional shooting pains in this region and admits to fatigue with daily naps.

## 2013-11-10 NOTE — Progress Notes (Signed)
   Department of Radiation Oncology  Phone:  [phone] Fax:        [phone]   Name: Christine [name] MRN: [id number]  DOB: 1955/11/10  Date: 11/10/2013  Follow Up Visit Note  Diagnosis: Stage III Left Breast Cancer  Summary and Interval since last radiation: 58.6 Gy to the left chest wall  Interval History: Christine [name] presents today for routine followup.  I asked her to come back for me to take a look at the moist desquamation along her chest wall. Fortunately she has developed some hyperpigmentation. The area that I used gentian violet on last week is actually better. She has a new area of moist desquamation however. There are some skin islands. Her internal mammary region has healed up well. He is accompanied by her sister. Her energy levels are good.  Allergies:  Allergies  Allergen Reactions  . Sulfa Antibiotics Hives, Itching and Swelling  . Sulfonamide Derivatives Hives, Itching and Swelling    Medications:  Current Outpatient Prescriptions  Medication Sig Dispense Refill  . diltiazem (CARDIZEM CD) 240 MG 24 hr capsule TAKE ONE CAPSULE BY MOUTH EVERY DAY  90 capsule  3  . fluticasone (FLONASE) 50 MCG/ACT nasal spray Place 2 sprays into the nose daily.  16 g  2  . gabapentin (NEURONTIN) 300 MG capsule Take 300 mg by mouth every morning. 3 month supply      . latanoprost (XALATAN) 0.005 % ophthalmic solution Place 1 drop into both eyes at bedtime.       Marland Kitchen levothyroxine (SYNTHROID, LEVOTHROID) 50 MCG tablet TAKE 1 TABLET BY MOUTH EVERY DAY  90 tablet  2  . LORazepam (ATIVAN) 0.5 MG tablet Take 1 tablet (0.5 mg total) by mouth every 8 (eight) hours.  30 tablet  0  . losartan-hydrochlorothiazide (HYZAAR) 50-12.5 MG per tablet TAKE 1 TABLET BY MOUTH DAILY.  90 tablet  3  . NEXIUM 40 MG capsule TAKE 1 CAPSULE (40 MG TOTAL) BY MOUTH DAILY BEFORE BREAKFAST.  90 capsule  1  . potassium chloride SA (K-DUR,KLOR-CON) 20 MEQ tablet Take 1 tablet (20 mEq total) by mouth 3 (three) times  daily.  90 tablet  0  . ondansetron (ZOFRAN) 8 MG tablet Take 8 mg by mouth 2 (two) times daily.      . prochlorperazine (COMPAZINE) 10 MG tablet Take 10 mg by mouth every 6 (six) hours as needed for nausea or vomiting.       No current facility-administered medications for this encounter.    Physical Exam:  Filed Vitals:   11/10/13 1333  BP: 136/81  Pulse: 102  Temp: 98 F (36.7 C)  Height: 5\' 4"  (1.626 m)  Weight: 207 lb 14.4 oz (94.303 kg)   large area of moist desquamation from the medial aspect of her scar to the lateral aspect of the chest wall. Other areas of the chest wall are hypopigmented and healing well. The remaining skin is hyperpigmented and dark. No evidence of infection.  IMPRESSION: Christine [name] is a 58 y.o. female that is post radiation and concurrent the low dose to the left chest wall with continuing moist desquamation  PLAN:  I applied more gentian violet should help this area heal and keep it from becoming infected. I will see her back next week.    Thea Silversmith, MD

## 2013-11-13 ENCOUNTER — Other Ambulatory Visit: Payer: Self-pay | Admitting: Adult Health

## 2013-11-13 DIAGNOSIS — C50919 Malignant neoplasm of unspecified site of unspecified female breast: Secondary | ICD-10-CM

## 2013-11-16 ENCOUNTER — Other Ambulatory Visit: Payer: Self-pay

## 2013-11-16 DIAGNOSIS — C50919 Malignant neoplasm of unspecified site of unspecified female breast: Secondary | ICD-10-CM

## 2013-11-17 ENCOUNTER — Other Ambulatory Visit (HOSPITAL_BASED_OUTPATIENT_CLINIC_OR_DEPARTMENT_OTHER): Payer: BC Managed Care – PPO

## 2013-11-17 ENCOUNTER — Ambulatory Visit (HOSPITAL_BASED_OUTPATIENT_CLINIC_OR_DEPARTMENT_OTHER): Payer: BC Managed Care – PPO | Admitting: Adult Health

## 2013-11-17 VITALS — BP 148/93 | HR 97 | Temp 98.3°F | Resp 18 | Ht 64.0 in | Wt 210.9 lb

## 2013-11-17 DIAGNOSIS — I890 Lymphedema, not elsewhere classified: Secondary | ICD-10-CM

## 2013-11-17 DIAGNOSIS — C773 Secondary and unspecified malignant neoplasm of axilla and upper limb lymph nodes: Secondary | ICD-10-CM

## 2013-11-17 DIAGNOSIS — G609 Hereditary and idiopathic neuropathy, unspecified: Secondary | ICD-10-CM

## 2013-11-17 DIAGNOSIS — C50919 Malignant neoplasm of unspecified site of unspecified female breast: Secondary | ICD-10-CM

## 2013-11-17 DIAGNOSIS — Z171 Estrogen receptor negative status [ER-]: Secondary | ICD-10-CM

## 2013-11-17 LAB — COMPREHENSIVE METABOLIC PANEL (CC13)
ALT: 8 U/L (ref 0–55)
AST: 17 U/L (ref 5–34)
Albumin: 4 g/dL (ref 3.5–5.0)
Alkaline Phosphatase: 111 U/L (ref 40–150)
Anion Gap: 10 mEq/L (ref 3–11)
BUN: 10.2 mg/dL (ref 7.0–26.0)
CO2: 27 mEq/L (ref 22–29)
Calcium: 9.8 mg/dL (ref 8.4–10.4)
Chloride: 105 mEq/L (ref 98–109)
Creatinine: 0.8 mg/dL (ref 0.6–1.1)
GLUCOSE: 111 mg/dL (ref 70–140)
POTASSIUM: 3.6 meq/L (ref 3.5–5.1)
Sodium: 143 mEq/L (ref 136–145)
Total Bilirubin: 0.43 mg/dL (ref 0.20–1.20)
Total Protein: 7.1 g/dL (ref 6.4–8.3)

## 2013-11-17 LAB — CBC WITH DIFFERENTIAL/PLATELET
BASO%: 0.4 % (ref 0.0–2.0)
Basophils Absolute: 0 10*3/uL (ref 0.0–0.1)
EOS%: 1.3 % (ref 0.0–7.0)
Eosinophils Absolute: 0 10*3/uL (ref 0.0–0.5)
HCT: 32.7 % — ABNORMAL LOW (ref 34.8–46.6)
HGB: 11.1 g/dL — ABNORMAL LOW (ref 11.6–15.9)
LYMPH%: 30.6 % (ref 14.0–49.7)
MCH: 33.2 pg (ref 25.1–34.0)
MCHC: 34.1 g/dL (ref 31.5–36.0)
MCV: 97.3 fL (ref 79.5–101.0)
MONO#: 0.5 10*3/uL (ref 0.1–0.9)
MONO%: 14 % (ref 0.0–14.0)
NEUT%: 53.7 % (ref 38.4–76.8)
NEUTROS ABS: 2 10*3/uL (ref 1.5–6.5)
Platelets: 172 10*3/uL (ref 145–400)
RBC: 3.36 10*6/uL — AB (ref 3.70–5.45)
RDW: 16.3 % — AB (ref 11.2–14.5)
WBC: 3.8 10*3/uL — ABNORMAL LOW (ref 3.9–10.3)
lymph#: 1.1 10*3/uL (ref 0.9–3.3)

## 2013-11-17 NOTE — Patient Instructions (Signed)
You are doing well.  Follow up with your PCP.  When they check your labs, please have your lipid panel and vitamin d level checked.  Also discuss a bone density exam.  You need an eye exam.    Please call us if you have any questions or concerns.

## 2013-11-17 NOTE — Progress Notes (Signed)
Hematology and Oncology Follow Up Visit  Christine [name] [id number] 08/25/1955 58 y.o. 11/17/2013 10:43 AM      Principle Diagnosis:Christine [name] 58 y.o. female with clinical stage III, triple negative, invasive ductal carcinoma of the left breast.     Prior Therapy: 1. Patient has history of right breast cancer patient underwent a mastectomy when she was 58 years old. This occurred during her pregnancy. After that patient did not take any kind of further treatments. She continued to do well.   2. The patient noticed that her left nipple had become itchy and retracted. She underwent mammogram with ultrasound that showed 2 areas of concern in the left breast (at the 12 o'clock position and 3 o'clock position). An ultrasound performed that showed the diameter to be at least 6 cm and multiple left axillary lymph nodes that were abnormal. A left breast needle core biopsy performed on 09/30/2012 at the 2 o'clock position showed invasive ductal carcinoma grade 3, estrogen receptor negative, progesterone receptor negative,proliferation marker Ki-67 21%, HER-2/neu negative. Left breast needle core biopsy at the 3 o'clock position showed grade 3 ductal carcinoma in situ.   3. Bilateral breast MRI on 10/12/2012 showed a previous right breast mastectomy and marked diffuse enhancement involving majority of the left breast measuring 13.0 x 12.5 x 9.5 cm. There was diffuse skin thickening and dermal enhancement. Multiple enlarged level I left axillary lymph nodes. The largest lymph node measured 2.4 cm in size. The large area of enhancement within the left breast extends posteriorly to abut the anterior portion of the pectoralis muscle. There was no evidence of internal mammary adenopathy or right axillary adenopathy.   4. PET/CT did show the left breast mass, and left axillary and subpectoralis nodal metastases, but no further areas throughout the chest/abd/pelvis of metastases.   5. Patient received 6 cycles  of neoadjuvant chemotherapy consisting of FEC (5-FU/epirubicin/Cytoxan) from 11/02/2012 through 01/25/2013 with Neulasta support. This was followed by 8 cycles of weekly Taxol/Carbo that started from 02/08/2013 through 04/19/2013. 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 She began neoadjuvant chemotherapy consisting of Gemzar/Carboplatin on 05/24/2013 with day 2 Neulasta support. Gemzar/carboplatinum completed 06/06/2013 for a total of 2 cycles.   #6 status post left mastectomy 07/19/2013. Pathology revealed:  Diagnosis  Breast, modified radical mastectomy , Left  - INVASIVE GRADE III DUCTAL CARCNIOMA, SPANNING 3.9 CM IN GREATEST DIMENSION.  - NEOADJUVANT CHANGES ARE PRESENT IN BREAST PARENCHYMA.  - MARGINS ARE NEGATIVE.  - NINE OF THIRTEEN LYMPH NODES ARE POSITIVE FOR METASTATIC DUCTAL CARCINOMA (9/13).  - EXTRACAPSULAR EXTENSION IS PRESENT.  - SEE ONCOLOGY TEMPLATE AND COMMENT.  #7  The patient underwent adjuvant radiation with concurrent Xeloda from 09/15/13 through 11/01/13.   Current therapy:  Observation  Interim History: Christine [name] 57 y.o. female with stage III, triple negative invasive ductal carcinoma.  She has recently completed adjuvant radiation therapy with concurrent xeloda.  She completed this on 11/01/13.  She continues to have some fatigue.  She did develop some desquamation from the radiation that is slightly painful.  She continues to slight tingling in her fingertips and take 300mg of Gabapentin daily and it doesn't interfere with her activities of daily living.  She has been seen in the lymphedema clinic for her left arm lymphedema and was recently fitted for a glove, a sleeve, and nightly compression device.  Otherwise, a 10 point ROS is negative.  Her health maintenance was   reviewed below.    Medications:  Current Outpatient Prescriptions  Medication Sig Dispense Refill  .  diltiazem (CARDIZEM CD) 240 MG 24 hr capsule TAKE ONE CAPSULE BY MOUTH EVERY DAY  90 capsule  3  . fluticasone (FLONASE) 50 MCG/ACT nasal spray PLACE 2 SPRAYS INTO THE NOSE DAILY.  16 g  0  . gabapentin (NEURONTIN) 300 MG capsule Take 300 mg by mouth every morning. 3 month supply      . latanoprost (XALATAN) 0.005 % ophthalmic solution Place 1 drop into both eyes at bedtime.       . levothyroxine (SYNTHROID, LEVOTHROID) 50 MCG tablet TAKE 1 TABLET BY MOUTH EVERY DAY  90 tablet  2  . LORazepam (ATIVAN) 0.5 MG tablet Take 1 tablet (0.5 mg total) by mouth every 8 (eight) hours.  30 tablet  0  . losartan-hydrochlorothiazide (HYZAAR) 50-12.5 MG per tablet TAKE 1 TABLET BY MOUTH DAILY.  90 tablet  3  . NEXIUM 40 MG capsule TAKE 1 CAPSULE (40 MG TOTAL) BY MOUTH DAILY BEFORE BREAKFAST.  90 capsule  1  . ondansetron (ZOFRAN) 8 MG tablet Take 8 mg by mouth 2 (two) times daily.      . potassium chloride SA (K-DUR,KLOR-CON) 20 MEQ tablet Take 1 tablet (20 mEq total) by mouth 3 (three) times daily.  90 tablet  0  . prochlorperazine (COMPAZINE) 10 MG tablet Take 10 mg by mouth every 6 (six) hours as needed for nausea or vomiting.       No current facility-administered medications for this visit.     Allergies:  Allergies  Allergen Reactions  . Sulfa Antibiotics Hives, Itching and Swelling  . Sulfonamide Derivatives Hives, Itching and Swelling    Medical History: Past Medical History  Diagnosis Date  . ALLERGIC RHINITIS   . GERD (gastroesophageal reflux disease)   . Hypertension   . Hypothyroidism   . Hx of colonic polyps   . Wears glasses   . Breast cancer 1988, age 30    right  . Breast cancer 09/30/12    left, ER/PR -, Her 2 -  . Radiation 09/15/13-11/01/13    Left chest wall/PAB/scar/supraclavicular fossa    Surgical History:  Past Surgical History  Procedure Laterality Date  . Tonsillectomy    . Caesarean section      x 1  . Mastectomy  04/1987    right side  . Abdominal  hysterectomy  06/1988    fibroids  . Portacath placement Right 10/28/2012    Procedure: PORT PLACEMENT;  Surgeon: Thomas A. Cornett, MD;  Location: [facility];  Service: General;  Laterality: Right;  Right Subclavian Vein  . Mastectomy modified radical Left 07/19/2013    Procedure: MASTECTOMY MODIFIED RADICAL;  Surgeon: Thomas A. Cornett, MD;  Location: [facility];  Service: General;  Laterality: Left;  . Port-a-cath removal Right 07/19/2013    Procedure: REMOVAL PORT-A-CATH;  Surgeon: Thomas A. Cornett, MD;  Location: [facility];  Service: General;  Laterality: Right;  . Breast surgery Left 07/19/13    MRM     Review of Systems: A 10 point review of systems was conducted and is otherwise negative except for what is noted above.    Health Maintenance  Mammogram: 09/2012.  Bilateral mastectomy Colonoscopy: 02/17/2012, 5 year f/u recommended Bone Density Scan:  Will be done with next PCP appointment Pap Smear: s/p TAH and salpingectomy Eye Exam: Due  Vitamin D Level:  Next pcp appoitnment Lipid Panel: next   pcp appointment   Physical Exam: Blood pressure 148/93, pulse 97, temperature 98.3 F (36.8 C), temperature source Oral, resp. rate 18, height 5' 4" (1.626 m), weight 210 lb 14.4 oz (95.664 kg). GENERAL: Patient is a well appearing female in no acute distress HEENT:  Sclerae anicteric.  Oropharynx clear and moist. No ulcerations or evidence of oropharyngeal candidiasis. Neck is supple.  NODES:  No cervical, supraclavicular, or axillary lymphadenopathy palpated.  BREAST EXAM:  Left chest wall with healing erythema and desquamation LUNGS:  Clear to auscultation bilaterally.  No wheezes or rhonchi. HEART:  Regular rate and rhythm. No murmur appreciated. ABDOMEN:  Soft, nontender.  Positive, normoactive bowel sounds. No organomegaly palpated. MSK:  No focal spinal tenderness to palpation. Full range of motion bilaterally in the upper  extremities. EXTREMITIES:  No peripheral edema.   SKIN:  Clear with no obvious rashes or skin changes. No nail dyscrasia. NEURO:  Nonfocal. Well oriented.  Appropriate affect. ECOG PERFORMANCE STATUS: 1 - Symptomatic but completely ambulatory   Lab Results: Lab Results  Component Value Date   WBC 3.8* 11/17/2013   HGB 11.1* 11/17/2013   HCT 32.7* 11/17/2013   MCV 97.3 11/17/2013   PLT 172 11/17/2013     Chemistry      Component Value Date/Time   NA 143 10/20/2013 1548   NA 144 10/28/2012 1248   K 3.4* 10/20/2013 1548   K 3.9 10/28/2012 1248   CL 101 01/25/2013 0952   CL 108 10/28/2012 1248   CO2 27 10/20/2013 1548   CO2 29 09/13/2011 1910   BUN 9.4 10/20/2013 1548   BUN 10 10/28/2012 1248   CREATININE 0.8 10/20/2013 1548   CREATININE 0.80 10/28/2012 1248      Component Value Date/Time   CALCIUM 9.9 10/20/2013 1548   CALCIUM 9.4 09/13/2011 1910   ALKPHOS 106 10/20/2013 1548   ALKPHOS 78 09/13/2011 1910   AST 24 10/20/2013 1548   AST 18 09/13/2011 1910   ALT 14 10/20/2013 1548   ALT 12 09/13/2011 1910   BILITOT 0.46 10/20/2013 1548   BILITOT 0.3 09/13/2011 1910      Assessment and Plan: Duncan 58 y.o. female with  1. Stage III, triple negative, invasive ductal carcinoma of the left breast.  See prior history above.  She has completed radiation with concurrent Xeloda.  She does have mild desquamation that she is going to see radiation oncology about.  Her CBC is stable.  I reviewed this with her in detail.  She has no sign of recurrence.  2. Neuropathy.  The patient will continue taking Gabapentin daily.  It is stable.    3. Lymphedema.  This has been managed by physical therapy.  She has been fitted for a glove, compression sleeve, and nighttime compression device.    4. Survivorship.  We reviewed her health maintenance above.  She is due for several things that she is planning on scheduling in the next couple of months when she sees her PCP.    The patient will return in 3 months for  labs and evaluation.  She knows to call us in the interim for any questions or concerns.  We can certainly see her sooner if needed.  I spent 25 minutes counseling the patient face to face.  The total time spent in the appointment was 30 minutes.  Minette Headland, [place] [phone] 11/17/2013 10:43 AM

## 2013-11-18 NOTE — Progress Notes (Signed)
  Radiation Oncology         [phone] ________________________________  Name: Christine [name] MRN: [id number]  Date: 09/14/2013  DOB: [birth year]  Simulation Verification Note  Status: outpatient  NARRATIVE: The patient was brought to the treatment unit and placed in the planned treatment position. The clinical setup was verified. Then port films were obtained and uploaded to the radiation oncology medical record software.  The treatment beams were carefully compared against the planned radiation fields. The position location and shape of the radiation fields was reviewed. The targeted volume of tissue appears appropriately covered by the radiation beams. Organs at risk appear to be excluded as planned.  Based on my personal review, I approved the simulation verification. The patient's treatment will proceed as planned.  ------------------------------------------------  Thea Silversmith, MD

## 2013-11-18 NOTE — Addendum Note (Signed)
Encounter addended by: Thea Silversmith, MD on: 11/18/2013  9:45 AM<BR>     Documentation filed: Notes Section

## 2013-11-18 NOTE — Progress Notes (Signed)
Name: Christine [name]   MRN: [id number]  Date:  11/18/2013   DOB: 1956-08-02  Status:outpatient    DIAGNOSIS: Breast cancer.  CONSENT VERIFIED: yes   SET UP: Patient is setup supine   IMMOBILIZATION:  The following immobilization was used:Custom Moldable Pillow, breast board.   NARRATIVE: [name] underwent complex simulation and treatment planning for her boost treatment today.  The depth of her chest wall was felt to be appropriate for treatment with electrons     6 MeV electrons will be prescribed to the 90% isodose line.   I personally oversaw and approved the construction of a unique block which will be used for beam modification purposes.  A special port plan is requested.

## 2013-11-19 ENCOUNTER — Encounter: Payer: Self-pay | Admitting: Adult Health

## 2013-11-21 ENCOUNTER — Telehealth: Payer: Self-pay | Admitting: Adult Health

## 2013-11-23 ENCOUNTER — Telehealth (INDEPENDENT_AMBULATORY_CARE_PROVIDER_SITE_OTHER): Payer: Self-pay

## 2013-11-23 NOTE — Telephone Encounter (Signed)
Pt has completed radiation treatments and is ready to be fitted for prosthesis.  She was referred to [facility]. They will determine what she needs and fax the order to Dr. Brantley Stage for signature.  Pt understood and agreed.

## 2013-12-01 ENCOUNTER — Other Ambulatory Visit (INDEPENDENT_AMBULATORY_CARE_PROVIDER_SITE_OTHER): Payer: Self-pay

## 2013-12-01 MED ORDER — UNABLE TO FIND: Status: DC

## 2013-12-02 ENCOUNTER — Ambulatory Visit: Payer: BC Managed Care – PPO | Admitting: Radiation Oncology

## 2013-12-02 ENCOUNTER — Encounter: Payer: Self-pay | Admitting: Radiation Oncology

## 2013-12-02 ENCOUNTER — Ambulatory Visit
Admission: RE | Admit: 2013-12-02 | Discharge: 2013-12-02 | Disposition: A | Discharge status: Home / Self Care | Payer: BC Managed Care – PPO | Source: Ambulatory Visit | Attending: Radiation Oncology | Admitting: Radiation Oncology

## 2013-12-02 VITALS — BP 140/91 | HR 88 | Temp 98.2°F | Ht 64.0 in | Wt 208.0 lb

## 2013-12-02 DIAGNOSIS — C50919 Malignant neoplasm of unspecified site of unspecified female breast: Secondary | ICD-10-CM

## 2013-12-02 NOTE — Progress Notes (Signed)
   Department of Radiation Oncology  Phone:  [phone] Fax:        [phone]   Name: BEENA CATANO MRN: [id number]  DOB: July 15, 1956  Date: 12/02/2013  Follow Up Visit Note  Diagnosis: Stage III Left Breast Cancer  Summary and Interval since last radiation: 58.6 Gy to the left chest wall completed 11/01/13  Interval History: Robinn presents today for routine followup. Her skin is completely healed. She does have some areas of hypopigmentation. She also is aware of hyperpigmentation which she is using vitamin E on. She is trying to focus on increasing her energy and seeing her other doctors. She's been too early July and had a pelvic exam as well as a bone density. She is seeing her eye doctor and her primary care. She is triple negative so she is not on antiestrogen therapy. She is interested in the live strong program at her local [employer].  Allergies:  Allergies  Allergen Reactions  . Sulfa Antibiotics Hives, Itching and Swelling  . Sulfonamide Derivatives Hives, Itching and Swelling    Medications:  Current Outpatient Prescriptions  Medication Sig Dispense Refill  . diltiazem (CARDIZEM CD) 240 MG 24 hr capsule TAKE ONE CAPSULE BY MOUTH EVERY DAY  90 capsule  3  . fluticasone (FLONASE) 50 MCG/ACT nasal spray PLACE 2 SPRAYS INTO THE NOSE DAILY.  16 g  0  . gabapentin (NEURONTIN) 300 MG capsule Take 100 mg by mouth every morning. 3 month supply      . latanoprost (XALATAN) 0.005 % ophthalmic solution Place 1 drop into both eyes at bedtime.       Marland Kitchen levothyroxine (SYNTHROID, LEVOTHROID) 50 MCG tablet TAKE 1 TABLET BY MOUTH EVERY DAY  90 tablet  2  . LORazepam (ATIVAN) 0.5 MG tablet Take 1 tablet (0.5 mg total) by mouth every 8 (eight) hours.  30 tablet  0  . losartan-hydrochlorothiazide (HYZAAR) 50-12.5 MG per tablet TAKE 1 TABLET BY MOUTH DAILY.  90 tablet  3  . NEXIUM 40 MG capsule TAKE 1 CAPSULE (40 MG TOTAL) BY MOUTH DAILY BEFORE BREAKFAST.  90 capsule  1  . ondansetron  (ZOFRAN) 8 MG tablet Take 8 mg by mouth 2 (two) times daily.      . potassium chloride SA (K-DUR,KLOR-CON) 20 MEQ tablet Take 1 tablet (20 mEq total) by mouth 3 (three) times daily.  90 tablet  0  . prochlorperazine (COMPAZINE) 10 MG tablet Take 10 mg by mouth every 6 (six) hours as needed for nausea or vomiting.      Marland Kitchen UNABLE TO FIND L8015 Post-Mastectomy Camisole #2 L8000 Post-Surgical Bras #6 S8546 Silicone Breast Prosthesis #2 174.9  1 each  0   No current facility-administered medications for this encounter.    Physical Exam:  Filed Vitals:   12/02/13 1542  BP: 140/91  Pulse: 88  Temp: 98.2 F (36.8 C)  Height: 5\' 4"  (1.626 m)  Weight: 208 lb (94.348 kg)  Large area of hyper pigmentation outling the treatment field with areas of hypopigmentation in the lateral aspect of the chest wall.   IMPRESSION: Taniya is a 58 y.o. female that is post radiation and concurrent the low dose Xeloda to the left chest wall resolved acute effects of treatment  PLAN: He looks great. She has followup scheduled with medical oncology. We discussed sun protection in the treated area and continued use of vitamin E. I will see her back on an as-needed basis.    Thea Silversmith, MD

## 2013-12-06 ENCOUNTER — Other Ambulatory Visit (INDEPENDENT_AMBULATORY_CARE_PROVIDER_SITE_OTHER): Payer: Self-pay

## 2013-12-06 MED ORDER — UNABLE TO FIND: Status: DC

## 2013-12-12 ENCOUNTER — Other Ambulatory Visit: Payer: Self-pay

## 2013-12-12 NOTE — Progress Notes (Signed)
Faxed medical clearance form to [facility].  Original sent to scan.

## 2013-12-14 ENCOUNTER — Other Ambulatory Visit (INDEPENDENT_AMBULATORY_CARE_PROVIDER_SITE_OTHER): Payer: BC Managed Care – PPO

## 2013-12-14 DIAGNOSIS — Z0000 Encounter for general adult medical examination without abnormal findings: Secondary | ICD-10-CM

## 2013-12-14 LAB — CBC WITH DIFFERENTIAL/PLATELET
Basophils Absolute: 0 10*3/uL (ref 0.0–0.1)
Basophils Relative: 0.3 % (ref 0.0–3.0)
EOS PCT: 2 % (ref 0.0–5.0)
Eosinophils Absolute: 0.1 10*3/uL (ref 0.0–0.7)
HEMATOCRIT: 33.3 % — AB (ref 36.0–46.0)
Hemoglobin: 11.2 g/dL — ABNORMAL LOW (ref 12.0–15.0)
LYMPHS ABS: 1.2 10*3/uL (ref 0.7–4.0)
LYMPHS PCT: 28 % (ref 12.0–46.0)
MCHC: 33.8 g/dL (ref 30.0–36.0)
MCV: 98.2 fl (ref 78.0–100.0)
MONOS PCT: 9.2 % (ref 3.0–12.0)
Monocytes Absolute: 0.4 10*3/uL (ref 0.1–1.0)
Neutro Abs: 2.5 10*3/uL (ref 1.4–7.7)
Neutrophils Relative %: 60.5 % (ref 43.0–77.0)
PLATELETS: 212 10*3/uL (ref 150.0–400.0)
RBC: 3.39 Mil/uL — ABNORMAL LOW (ref 3.87–5.11)
RDW: 14.6 % (ref 11.5–15.5)
WBC: 4.2 10*3/uL (ref 4.0–10.5)

## 2013-12-14 LAB — HEPATIC FUNCTION PANEL
ALK PHOS: 85 U/L (ref 39–117)
ALT: 13 U/L (ref 0–35)
AST: 21 U/L (ref 0–37)
Albumin: 4.2 g/dL (ref 3.5–5.2)
BILIRUBIN TOTAL: 0.6 mg/dL (ref 0.2–1.2)
Bilirubin, Direct: 0.1 mg/dL (ref 0.0–0.3)
Total Protein: 7.1 g/dL (ref 6.0–8.3)

## 2013-12-14 LAB — TSH: TSH: 1.46 u[IU]/mL (ref 0.35–4.50)

## 2013-12-14 LAB — LIPID PANEL
CHOL/HDL RATIO: 5
Cholesterol: 195 mg/dL (ref 0–200)
HDL: 40 mg/dL (ref >39.00)
LDL CALC: 126 mg/dL — AB (ref 0–99)
Triglycerides: 145 mg/dL (ref 0.0–149.0)
VLDL: 29 mg/dL (ref 0.0–40.0)

## 2013-12-14 LAB — BASIC METABOLIC PANEL
BUN: 12 mg/dL (ref 6–23)
CO2: 31 mEq/L (ref 19–32)
Calcium: 9.5 mg/dL (ref 8.4–10.5)
Chloride: 102 mEq/L (ref 96–112)
Creatinine, Ser: 0.8 mg/dL (ref 0.4–1.2)
GFR: 96.26 mL/min (ref >60.00)
Glucose, Bld: 112 mg/dL — ABNORMAL HIGH (ref 70–99)
POTASSIUM: 3 meq/L — AB (ref 3.5–5.1)
Sodium: 141 mEq/L (ref 135–145)

## 2013-12-22 ENCOUNTER — Encounter: Payer: Self-pay | Admitting: Family Medicine

## 2013-12-22 ENCOUNTER — Ambulatory Visit (INDEPENDENT_AMBULATORY_CARE_PROVIDER_SITE_OTHER): Payer: BC Managed Care – PPO | Admitting: Family Medicine

## 2013-12-22 VITALS — BP 120/80 | HR 98 | Temp 98.7°F | Ht 64.0 in | Wt 213.0 lb

## 2013-12-22 DIAGNOSIS — Z7189 Other specified counseling: Secondary | ICD-10-CM

## 2013-12-22 DIAGNOSIS — E538 Deficiency of other specified B group vitamins: Secondary | ICD-10-CM

## 2013-12-22 DIAGNOSIS — Z7689 Persons encountering health services in other specified circumstances: Secondary | ICD-10-CM

## 2013-12-22 DIAGNOSIS — E559 Vitamin D deficiency, unspecified: Secondary | ICD-10-CM

## 2013-12-22 DIAGNOSIS — G620 Drug-induced polyneuropathy: Secondary | ICD-10-CM

## 2013-12-22 DIAGNOSIS — I890 Lymphedema, not elsewhere classified: Secondary | ICD-10-CM

## 2013-12-22 HISTORY — DX: Lymphedema, not elsewhere classified: I89.0

## 2013-12-22 LAB — VITAMIN B12: VITAMIN B 12: 251 pg/mL (ref 211–911)

## 2013-12-22 NOTE — Progress Notes (Signed)
Pre visit review using our clinic review tool, if applicable. No additional management support is needed unless otherwise documented below in the visit note. 

## 2013-12-22 NOTE — Progress Notes (Signed)
No chief complaint on file.   HPI:  Christine [name] is here to establish care/transfer care from Dr. Arnoldo Morale to me. She actually was last seen here in Feb of 2014 for an acute visit with me and was sent to oncology for likely breast cancer. She has since been seeing oncology, surgery and radiology for recurrent breast cancer. Reports on survival list for the 2nd time in 24 years. Reports hx of B12 def and treated in the past so wants to check this. Wants Vit D level too. She reports everything else checked at cancer and here recently center and they are following her potassium. Back on potassium.  Last PCP and physical: sees gyn and had exam in April and had bone density. Reports her gynecologist told her no more paps as had hysterectomy.  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 B12 deficiency 12/22/2013  . Vitamin D deficiency 12/22/2013  . Lymph edema L arm from breast ca tx 12/22/2013  . Breast cancer 10/05/2012  . PREDIABETES 05/15/2010  . ANXIETY DEPRESSION 10/01/2007  . MORBID OBESITY 05/31/2007  . HYPOTHYROIDISM 02/16/2007  . HYPERTENSION 02/16/2007  . ALLERGIC RHINITIS 02/16/2007  . GERD 02/16/2007    Health Maintenance: -sees gyn, up to date ROS: See pertinent positives and negatives per HPI.  Past Medical History  Diagnosis Date  . ALLERGIC RHINITIS   . GERD (gastroesophageal reflux disease)   . Hypertension   . Hypothyroidism   . Hx of colonic polyps   . Wears glasses   . Breast cancer 45, age 67    right  . Breast cancer  09/30/12    left, ER/PR -, Her 2 -  . Radiation 09/15/13-11/01/13    Left chest wall/PAB/scar/supraclavicular fossa  . SYNCOPE 05/15/2010    Qualifier: Diagnosis of  By: Arnoldo Morale MD, [place], WITH RADICULOPATHY 10/01/2007    Qualifier: Diagnosis of  By: Arnoldo Morale MD, [place] 05/31/2007    Qualifier: Diagnosis of  By: Arnoldo Morale MD, [place], HX OF 05/31/2007    Qualifier: Diagnosis of  By: Arnoldo Morale MD, [place]   . Lymph edema L arm from breast ca tx 12/22/2013   Past Surgical History  Procedure Laterality Date  . Tonsillectomy    . Caesarean section      x 1  . Mastectomy  04/1987    right side  . Abdominal hysterectomy  06/1988    fibroids  . Portacath placement Right 10/28/2012    Procedure: PORT PLACEMENT;  Surgeon: Joyice Faster. Cornett, MD;  Location: [facility];  Service: General;  Laterality: Right;  Right Subclavian Vein  . Mastectomy modified radical Left 07/19/2013    Procedure: MASTECTOMY MODIFIED RADICAL;  Surgeon: Marcello Moores A. Cornett, MD;  Location: [place];  Service: General;  Laterality: Left;  . Port-a-cath removal Right 07/19/2013    Procedure: REMOVAL PORT-A-CATH;  Surgeon: Joyice Faster. Cornett, MD;  Location: [place];  Service: General;  Laterality: Right;  . Breast surgery Left 07/19/13    MRM    Family History  Problem Relation Age of Onset  . Colon cancer Mother 27    family hx of colon ca 1st degree relative <60  . Coronary artery disease Father     family hx of CAD female 1st degree relative <50  . Stomach cancer Neg Hx   . Rectal cancer Neg Hx   . Esophageal cancer Neg Hx   . Breast cancer Maternal Grandmother 80  . Diabetes Paternal Grandmother   . Breast cancer Cousin 47    maternal cousin  . Coronary artery disease Maternal Grandfather   . Breast cancer Sister     paternal half sister; died in her 39s    History   Social History  . Marital Status: Married     Spouse Name: N/A    Number of Children: N/A  . Years of Education: N/A   Social History Main Topics  . Smoking status: Never Smoker   . Smokeless tobacco: Never Used  . Alcohol Use: No  . Drug Use: No  . Sexual Activity: Not Currently     Comment: menarche 1, 1st pregnancy age 12-miscarr, age 44 1st live birth, menop 46   Other Topics Concern  . None   Social History Narrative   Work or School: not currently working - retiring      Home Situation: lives with husband      Spiritual Beliefs:Christian      Lifestyle: started the [employer] exercise program for cancer survivors (12/2013); working on diet             Current outpatient prescriptions:diltiazem (CARDIZEM CD) 240 MG 24 hr capsule, TAKE ONE CAPSULE BY MOUTH EVERY DAY, Disp: 90 capsule, Rfl: 3;  fluticasone (FLONASE) 50 MCG/ACT nasal spray, PLACE 2 SPRAYS INTO THE NOSE DAILY., Disp: 16 g, Rfl: 0;  gabapentin (NEURONTIN) 300 MG capsule, Take 100 mg by mouth every morning. 3 month supply, Disp: , Rfl:  latanoprost (XALATAN) 0.005 % ophthalmic solution, Place 1 drop into both eyes at bedtime. , Disp: , Rfl: ;  levothyroxine (SYNTHROID, LEVOTHROID) 50 MCG tablet, TAKE 1 TABLET BY MOUTH EVERY DAY, Disp: 90 tablet, Rfl: 2;  LORazepam (ATIVAN) 0.5 MG tablet, Take 1 tablet (0.5 mg total) by mouth every 8 (eight) hours., Disp: 30 tablet, Rfl: 0 losartan-hydrochlorothiazide (HYZAAR) 50-12.5 MG per tablet, TAKE 1 TABLET BY MOUTH DAILY., Disp: 90 tablet, Rfl: 3;  NEXIUM 40 MG capsule, TAKE 1 CAPSULE (40 MG TOTAL) BY MOUTH DAILY BEFORE BREAKFAST., Disp: 90 capsule, Rfl: 1;  NON FORMULARY, Fungifoam, Disp: , Rfl: ;  potassium chloride SA (K-DUR,KLOR-CON) 20 MEQ tablet, Take 1 tablet (20 mEq total) by mouth 3 (three) times daily., Disp: 90 tablet, Rfl: 0 UNABLE TO FIND, L8015 Post-Mastectomy Camisole #2 L8000 Post-Surgical Bras #6 [id number] Silicone Breast Prosthesis #2 174.9, Disp: 1 each, Rfl: 0;  UNABLE TO FIND, A1937 Mastectomy Forms - 6 mths 1  refill, Disp: 1 each, Rfl: 0  EXAM:  Filed Vitals:   12/22/13 1425  BP: 120/80  Pulse: 98  Temp: 98.7 F (37.1 C)    Body mass index is 36.54 kg/(m^2).  GENERAL: vitals reviewed and listed above, alert, oriented, appears well hydrated and in no acute distress  HEENT: atraumatic, conjunttiva clear, no obvious abnormalities on inspection of external nose and ears  NECK: no obvious masses on inspection  LUNGS: clear to auscultation bilaterally, no wheezes, rales or rhonchi, good air  movement  CV: HRRR, no peripheral edema  MS: moves all extremities without noticeable abnormality  PSYCH: pleasant and cooperative, no obvious depression or anxiety  ASSESSMENT AND PLAN:  Discussed the following assessment and plan:  B12 deficiency - Plan: Vitamin B12  Vitamin D deficiency - Plan: Vitamin D, 25-hydroxy  Lymph edema L arm from breast ca tx  Encounter to establish care with new doctor   -We reviewed the PMH, PSH, FH, SH, Meds and Allergies. -We provided refills for any medications we will prescribe as needed. -We addressed current concerns per orders and patient instructions. -We have asked for records for pertinent exams, studies, vaccines and notes from previous providers. -We have advised patient to follow up per instructions below. -labs per her request - reports her oncologist restarted her potassium and is following this   -Patient advised to return or notify a doctor immediately if symptoms worsen or persist or new concerns arise.  There are no Patient Instructions on file for this visit.   Lucretia Kern

## 2013-12-23 LAB — VITAMIN D 25 HYDROXY (VIT D DEFICIENCY, FRACTURES): Vit D, 25-Hydroxy: 27 ng/mL — ABNORMAL LOW (ref 30–89)

## 2013-12-28 ENCOUNTER — Telehealth: Payer: Self-pay | Admitting: *Deleted

## 2013-12-28 NOTE — Telephone Encounter (Signed)
Received fax request for K+ from [facility].  Reviwed chart.  Patient is no longer on active chemotherapy.  Patient is on  B/P medication -Hyzaar.   Returned fax to [facility] and requested that they get refill from PCP- [place] at [facility].

## 2014-01-04 ENCOUNTER — Other Ambulatory Visit: Payer: Self-pay | Admitting: Internal Medicine

## 2014-02-15 ENCOUNTER — Other Ambulatory Visit: Payer: Self-pay | Admitting: *Deleted

## 2014-02-15 DIAGNOSIS — E559 Vitamin D deficiency, unspecified: Secondary | ICD-10-CM

## 2014-02-15 DIAGNOSIS — C50912 Malignant neoplasm of unspecified site of left female breast: Secondary | ICD-10-CM

## 2014-02-16 ENCOUNTER — Other Ambulatory Visit (HOSPITAL_BASED_OUTPATIENT_CLINIC_OR_DEPARTMENT_OTHER): Payer: BC Managed Care – PPO

## 2014-02-16 ENCOUNTER — Ambulatory Visit (HOSPITAL_BASED_OUTPATIENT_CLINIC_OR_DEPARTMENT_OTHER): Payer: BC Managed Care – PPO | Admitting: Adult Health

## 2014-02-16 ENCOUNTER — Encounter: Payer: Self-pay | Admitting: Adult Health

## 2014-02-16 ENCOUNTER — Telehealth: Payer: Self-pay | Admitting: Adult Health

## 2014-02-16 VITALS — BP 135/74 | HR 90 | Temp 98.7°F | Resp 20 | Ht 64.0 in | Wt 213.8 lb

## 2014-02-16 DIAGNOSIS — C50919 Malignant neoplasm of unspecified site of unspecified female breast: Secondary | ICD-10-CM

## 2014-02-16 DIAGNOSIS — C50912 Malignant neoplasm of unspecified site of left female breast: Secondary | ICD-10-CM

## 2014-02-16 DIAGNOSIS — Z171 Estrogen receptor negative status [ER-]: Secondary | ICD-10-CM

## 2014-02-16 DIAGNOSIS — G589 Mononeuropathy, unspecified: Secondary | ICD-10-CM

## 2014-02-16 DIAGNOSIS — E559 Vitamin D deficiency, unspecified: Secondary | ICD-10-CM

## 2014-02-16 DIAGNOSIS — C773 Secondary and unspecified malignant neoplasm of axilla and upper limb lymph nodes: Secondary | ICD-10-CM

## 2014-02-16 DIAGNOSIS — I890 Lymphedema, not elsewhere classified: Secondary | ICD-10-CM

## 2014-02-16 LAB — CBC WITH DIFFERENTIAL/PLATELET
BASO%: 0.4 % (ref 0.0–2.0)
BASOS ABS: 0 10*3/uL (ref 0.0–0.1)
EOS%: 2.7 % (ref 0.0–7.0)
Eosinophils Absolute: 0.2 10*3/uL (ref 0.0–0.5)
HEMATOCRIT: 37.2 % (ref 34.8–46.6)
HEMOGLOBIN: 12.3 g/dL (ref 11.6–15.9)
LYMPH%: 18.7 % (ref 14.0–49.7)
MCH: 31.1 pg (ref 25.1–34.0)
MCHC: 33.2 g/dL (ref 31.5–36.0)
MCV: 93.6 fL (ref 79.5–101.0)
MONO#: 0.5 10*3/uL (ref 0.1–0.9)
MONO%: 8.6 % (ref 0.0–14.0)
NEUT#: 3.8 10*3/uL (ref 1.5–6.5)
NEUT%: 69.6 % (ref 38.4–76.8)
Platelets: 244 10*3/uL (ref 145–400)
RBC: 3.97 10*6/uL (ref 3.70–5.45)
RDW: 13.4 % (ref 11.2–14.5)
WBC: 5.5 10*3/uL (ref 3.9–10.3)
lymph#: 1 10*3/uL (ref 0.9–3.3)

## 2014-02-16 LAB — COMPREHENSIVE METABOLIC PANEL (CC13)
ALBUMIN: 4.1 g/dL (ref 3.5–5.0)
ALT: 15 U/L (ref 0–55)
AST: 20 U/L (ref 5–34)
Alkaline Phosphatase: 120 U/L (ref 40–150)
Anion Gap: 12 mEq/L — ABNORMAL HIGH (ref 3–11)
BUN: 13.2 mg/dL (ref 7.0–26.0)
CALCIUM: 10.2 mg/dL (ref 8.4–10.4)
CHLORIDE: 103 meq/L (ref 98–109)
CO2: 29 meq/L (ref 22–29)
CREATININE: 0.8 mg/dL (ref 0.6–1.1)
Glucose: 116 mg/dl (ref 70–140)
Potassium: 3.3 mEq/L — ABNORMAL LOW (ref 3.5–5.1)
Sodium: 143 mEq/L (ref 136–145)
Total Bilirubin: 0.44 mg/dL (ref 0.20–1.20)
Total Protein: 7.5 g/dL (ref 6.4–8.3)

## 2014-02-16 NOTE — Progress Notes (Signed)
Hematology and Oncology Follow Up Visit  Christine [name] [id number] 01-Jan-1956 58 y.o. 02/16/2014 3:26 PM      [place] 58 y.o. female with clinical stage III, triple negative, invasive ductal carcinoma of the left breast.     Prior Therapy: 1. Patient has history of right breast cancer patient underwent a mastectomy when she was 58 years old. This occurred during her pregnancy. After that patient did not take any kind of further treatments. She continued to do well.   2. The patient noticed that her left nipple had become itchy and retracted. She underwent mammogram with ultrasound that showed 2 areas of concern in the left breast (at the 12 o'clock position and 3 o'clock position). An ultrasound performed that showed the diameter to be at least 6 cm and multiple left axillary lymph nodes that were abnormal. A left breast needle core biopsy performed on 09/30/2012 at the 2 o'clock position showed invasive ductal carcinoma grade 3, estrogen receptor negative, progesterone receptor negative,proliferation marker Ki-67 21%, HER-2/neu negative. Left breast needle core biopsy at the 3 o'clock position showed grade 3 ductal carcinoma in situ.   3. Bilateral breast MRI on 10/12/2012 showed a previous right breast mastectomy and marked diffuse enhancement involving majority of the left breast measuring 13.0 x 12.5 x 9.5 cm. There was diffuse skin thickening and dermal enhancement. Multiple enlarged level I left axillary lymph nodes. The largest lymph node measured 2.4 cm in size. The large area of enhancement within the left breast extends posteriorly to abut the anterior portion of the pectoralis muscle. There was no evidence of internal mammary adenopathy or right axillary adenopathy.   4. PET/CT did show the left breast mass, and left axillary and subpectoralis nodal metastases, but no further areas throughout the chest/abd/pelvis of metastases.   5. Patient received 6 cycles  of neoadjuvant chemotherapy consisting of FEC (5-FU/epirubicin/Cytoxan) from 11/02/2012 through 01/25/2013 with Neulasta support. This was followed by 8 cycles of weekly Taxol/Carbo that started from 02/08/2013 through 04/19/2013. 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 She began neoadjuvant chemotherapy consisting of Gemzar/Carboplatin on 05/24/2013 with day 2 Neulasta support. Gemzar/carboplatinum completed 06/06/2013 for a total of 2 cycles.   #6 status post left mastectomy 07/19/2013. Pathology revealed:  Diagnosis  Breast, modified radical mastectomy , Left  - INVASIVE GRADE III DUCTAL CARCNIOMA, SPANNING 3.9 CM IN GREATEST DIMENSION.  - NEOADJUVANT CHANGES ARE PRESENT IN BREAST PARENCHYMA.  - MARGINS ARE NEGATIVE.  - NINE OF THIRTEEN LYMPH NODES ARE POSITIVE FOR METASTATIC DUCTAL CARCINOMA (9/13).  - EXTRACAPSULAR EXTENSION IS PRESENT.  - SEE ONCOLOGY TEMPLATE AND COMMENT.  #7  The patient underwent adjuvant radiation with concurrent Xeloda from 09/15/13 through 11/01/13.   Current therapy:  Observation  Interim History: Christine [name] 58 y.o. female with stage III, triple negative invasive ductal carcinoma.  Christine [name] is doing well.  She tells me that her neuropathy is much improved.  She has joined the [employer] at [employer] and has been working out.  Her husband also joined and she has enjoyed her workouts.  She denies fevers, chills, nausea, vomiting, bowel/bladder problems, new pain, shortness of breath, cough or any further concerns.     Medications:  Current Outpatient Prescriptions  Medication Sig Dispense Refill  . cholecalciferol (VITAMIN D) 1000 UNITS tablet Take 1,000 Units by mouth daily.      Marland Kitchen diltiazem (CARDIZEM CD) 240 MG 24 hr capsule  TAKE ONE CAPSULE BY MOUTH EVERY DAY  90 capsule  3  . gabapentin (NEURONTIN) 100 MG capsule Take 100 mg by mouth at bedtime.      Marland Kitchen  latanoprost (XALATAN) 0.005 % ophthalmic solution Place 1 drop into both eyes at bedtime.       Marland Kitchen levothyroxine (SYNTHROID, LEVOTHROID) 50 MCG tablet TAKE 1 TABLET BY MOUTH EVERY DAY  90 tablet  2  . losartan-hydrochlorothiazide (HYZAAR) 50-12.5 MG per tablet TAKE 1 TABLET BY MOUTH DAILY.  90 tablet  3  . NEXIUM 40 MG capsule TAKE 1 CAPSULE (40 MG TOTAL) BY MOUTH DAILY BEFORE BREAKFAST.  90 capsule  1  . NON FORMULARY Fungifoam, Rx by podiatrist and uses as directed      . potassium chloride SA (K-DUR,KLOR-CON) 20 MEQ tablet Take 1 tablet (20 mEq total) by mouth 3 (three) times daily.  90 tablet  0  . UNABLE TO FIND L8015 Post-Mastectomy Camisole #2 L8000 Post-Surgical Bras #6 Z6109 Silicone Breast Prosthesis #2 174.9  1 each  0  . UNABLE TO FIND U0454 Mastectomy Forms - 6 mths 1 refill  1 each  0  . fluticasone (FLONASE) 50 MCG/ACT nasal spray PLACE 2 SPRAYS INTO THE NOSE DAILY.  16 g  0  . LORazepam (ATIVAN) 0.5 MG tablet Take 1 tablet (0.5 mg total) by mouth every 8 (eight) hours.  30 tablet  0   No current facility-administered medications for this visit.     Allergies:  Allergies  Allergen Reactions  . Sulfa Antibiotics Hives, Itching and Swelling  . Sulfonamide Derivatives Hives, Itching and Swelling    Medical History: Past Medical History  Diagnosis Date  . ALLERGIC RHINITIS   . GERD (gastroesophageal reflux disease)   . Hypertension   . Hypothyroidism   . Hx of colonic polyps   . Wears glasses   . Breast cancer 61, age 89    right  . Breast cancer 09/30/12    left, ER/PR -, Her 2 -  . Radiation 09/15/13-11/01/13    Left chest wall/PAB/scar/supraclavicular fossa  . SYNCOPE 05/15/2010    Qualifier: Diagnosis of  By: Arnoldo Morale MD, [place], WITH RADICULOPATHY 10/01/2007    Qualifier: Diagnosis of  By: Arnoldo Morale MD, [place] 05/31/2007    Qualifier: Diagnosis of  By: Arnoldo Morale MD, [place], HX OF 05/31/2007    Qualifier:  Diagnosis of  By: Arnoldo Morale MD, [place]   . Lymph edema L arm from breast ca tx 12/22/2013    Surgical History:  Past Surgical History  Procedure Laterality Date  . Tonsillectomy    . Caesarean section      x 1  . Mastectomy  04/1987    right side  . Abdominal hysterectomy  06/1988    fibroids  . Portacath placement Right 10/28/2012    Procedure: PORT PLACEMENT;  Surgeon: Joyice Faster. Cornett, MD;  Location: [place];  Service: General;  Laterality: Right;  Right Subclavian Vein  . Mastectomy modified radical Left 07/19/2013    Procedure: MASTECTOMY MODIFIED RADICAL;  Surgeon: Marcello Moores A. Cornett, MD;  Location: [place];  Service: General;  Laterality: Left;  . Port-a-cath removal Right 07/19/2013    Procedure: REMOVAL PORT-A-CATH;  Surgeon: Joyice Faster. Cornett, MD;  Location: [place];  Service: General;  Laterality: Right;  . Breast surgery Left 07/19/13    MRM  Review of Systems: A 10 point review of systems was conducted and is otherwise negative except for what is noted above.    Health Maintenance  Mammogram: 09/2012.  Bilateral mastectomy Colonoscopy: 02/17/2012, 5 year f/u recommended Bone Density Scan:  Will be done with next PCP appointment Pap Smear: s/p TAH and salpingectomy Eye Exam: Due  Vitamin D Level:  Next pcp appoitnment Lipid Panel: next pcp appointment   Physical Exam: Blood pressure 135/74, pulse 90, temperature 98.7 F (37.1 C), temperature source Oral, resp. rate 20, height $RemoveBe'5\' 4"'FmwtPMRwl$  (1.626 m), weight 213 lb 12.8 oz (96.979 kg). GENERAL: Patient is a well appearing female in no acute distress HEENT:  Sclerae anicteric.  Oropharynx clear and moist. No ulcerations or evidence of oropharyngeal candidiasis. Neck is supple.  NODES:  No cervical, supraclavicular, or axillary lymphadenopathy palpated.  BREAST EXAM:  Left chest with hyperpigmentation to radiation, no sign of recurrence, right breast mastectomy site  without nodularity, no sign of recurrence LUNGS:  Clear to auscultation bilaterally.  No wheezes or rhonchi. HEART:  Regular rate and rhythm. No murmur appreciated. ABDOMEN:  Soft, nontender.  Positive, normoactive bowel sounds. No organomegaly palpated. MSK:  No focal spinal tenderness to palpation. Full range of motion bilaterally in the upper extremities. EXTREMITIES:  No peripheral edema.   SKIN:  Clear with no obvious rashes or skin changes. No nail dyscrasia. NEURO:  Nonfocal. Well oriented.  Appropriate affect. ECOG PERFORMANCE STATUS: 1 - Symptomatic but completely ambulatory   Lab Results: Lab Results  Component Value Date   WBC 5.5 02/16/2014   HGB 12.3 02/16/2014   HCT 37.2 02/16/2014   MCV 93.6 02/16/2014   PLT 244 02/16/2014     Chemistry      Component Value Date/Time   NA 141 12/14/2013 1036   NA 143 11/17/2013 1026   K 3.0* 12/14/2013 1036   K 3.6 11/17/2013 1026   CL 102 12/14/2013 1036   CL 101 01/25/2013 0952   CO2 31 12/14/2013 1036   CO2 27 11/17/2013 1026   BUN 12 12/14/2013 1036   BUN 10.2 11/17/2013 1026   CREATININE 0.8 12/14/2013 1036   CREATININE 0.8 11/17/2013 1026      Component Value Date/Time   CALCIUM 9.5 12/14/2013 1036   CALCIUM 9.8 11/17/2013 1026   ALKPHOS 85 12/14/2013 1036   ALKPHOS 111 11/17/2013 1026   AST 21 12/14/2013 1036   AST 17 11/17/2013 1026   ALT 13 12/14/2013 1036   ALT 8 11/17/2013 1026   BILITOT 0.6 12/14/2013 1036   BILITOT 0.43 11/17/2013 1026      Assessment and Plan: Christine [name] 58 y.o. female with  1. Stage III, triple negative, invasive ductal carcinoma of the left breast.  See prior history above.  She has completed radiation with concurrent Xeloda and has since stopped radiation therapy.  She is doing well today.  She has no sign of recurrence.  2. Neuropathy.  This is much improved.  She will continue to take Gabapentin TID.  3. Lymphedema.  She has worked with the lymphedema clinic and this is stable.    4. Survivorship.   I recommended she continue healthy diet, exercise, and examining her mastectomy sites.   Christine [name] will f/u with Dr. Brantley Stage on Monday, 02/20/14.    The patient will return in 3 months for labs and evaluation.  She knows to call us in the interim for any questions or concerns.  We can certainly see her sooner if needed.  I spent 25 minutes counseling the patient face to face.  The total time spent in the appointment was 30 minutes.  Minette Headland, [place] [phone] 02/16/2014 3:26 PM

## 2014-02-16 NOTE — Telephone Encounter (Signed)
per pof to sch pt appt-sch & gave pt copy of sch °

## 2014-02-17 LAB — VITAMIN D 25 HYDROXY (VIT D DEFICIENCY, FRACTURES): Vit D, 25-Hydroxy: 36 ng/mL (ref 30–89)

## 2014-02-20 ENCOUNTER — Encounter (INDEPENDENT_AMBULATORY_CARE_PROVIDER_SITE_OTHER): Payer: Self-pay | Admitting: Surgery

## 2014-02-20 ENCOUNTER — Ambulatory Visit (INDEPENDENT_AMBULATORY_CARE_PROVIDER_SITE_OTHER): Payer: PRIVATE HEALTH INSURANCE | Admitting: Surgery

## 2014-02-20 VITALS — BP 132/78 | HR 74 | Temp 97.8°F | Resp 16 | Ht 63.75 in | Wt 209.8 lb

## 2014-02-20 DIAGNOSIS — Z853 Personal history of malignant neoplasm of breast: Secondary | ICD-10-CM

## 2014-02-20 NOTE — Progress Notes (Signed)
NAME: Christine [name]       DOB: 1956/06/18           DATE: 02/20/2014        MRN: [id number]  CC:   Chief Complaint  Patient presents with  . Breast Cancer Long Term Follow Up    Christine [name] is a 58 y.o.Marland Kitchenfemale   Patient has history of right breast cancer patient underwent a mastectomy when she was 58 years old. This occurred during her pregnancy. After that patient did not take any kind of further treatments. She continued to do well.  2. The patient noticed that her left nipple had become itchy and retracted. She underwent mammogram with ultrasound that showed 2 areas of concern in the left breast (at the 12 o'clock position and 3 o'clock position). An ultrasound performed that showed the diameter to be at least 6 cm and multiple left axillary lymph nodes that were abnormal. A left breast needle core biopsy performed on 09/30/2012 at the 2 o'clock position showed invasive ductal carcinoma grade 3, estrogen receptor negative, progesterone receptor negative,proliferation marker Ki-67 21%, HER-2/neu negative. Left breast needle core biopsy at the 3 o'clock position showed grade 3 ductal carcinoma in situ.  3. Bilateral breast MRI on 10/12/2012 showed a previous right breast mastectomy and marked diffuse enhancement involving majority of the left breast measuring 13.0 x 12.5 x 9.5 cm. There was diffuse skin thickening and dermal enhancement. Multiple enlarged level I left axillary lymph nodes. The largest lymph node measured 2.4 cm in size. The large area of enhancement within the left breast extends posteriorly to abut the anterior portion of the pectoralis muscle. There was no evidence of internal mammary adenopathy or right axillary adenopathy.  4. PET/CT did show the left breast mass, and left axillary and subpectoralis nodal metastases, but no further areas throughout the chest/abd/pelvis of metastases.  5. Patient received 6 cycles of neoadjuvant chemotherapy consisting of FEC  (5-FU/epirubicin/Cytoxan) from 11/02/2012 through 01/25/2013 with Neulasta support. This was followed by 8 cycles of weekly Taxol/Carbo that started from 02/08/2013 through 04/19/2013. 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 She began neoadjuvant chemotherapy consisting of Gemzar/Carboplatin on 05/24/2013 with day 2 Neulasta support. Gemzar/carboplatinum completed 06/06/2013 for a total of 2 cycles.  #6 status post left mastectomy 07/19/2013. Pathology revealed:  Diagnosis  Breast, modified radical mastectomy , Left  - INVASIVE GRADE III DUCTAL CARCNIOMA, SPANNING 3.9 CM IN GREATEST DIMENSION.  - NEOADJUVANT CHANGES ARE PRESENT IN BREAST PARENCHYMA.  - MARGINS ARE NEGATIVE.  - NINE OF THIRTEEN LYMPH NODES ARE POSITIVE FOR METASTATIC DUCTAL CARCINOMA (9/13).  - EXTRACAPSULAR EXTENSION IS PRESENT.  - SEE ONCOLOGY TEMPLATE AND COMMENT.  #7 The patient underwent adjuvant radiation with concurrent Xeloda from 09/15/13 through 11/01/13.  Pt is working out at [employer] and feels well.  Had significant burning on left after radiation therapy.  Lymphedema controlled.  Travelling this summer.      PFSH: She has had no significant changes since the last visit here.  ROS: There have been no significant changes since the last visit here  EXAM:  VS: BP 132/78  Pulse 74  Temp(Src) 97.8 F (36.6 C) (Temporal)  Resp 16  Ht 5' 3.75" (1.619 m)  Wt 209 lb 12.8 oz (95.165 kg)  BMI 36.31 kg/m2  General: The patient is alert, oriented, generally healthy appearing, NAD. Mood and affect are normal.  Breasts:  both breasts  Absent.  significantscarring of left side thickened  And rubbery but no masses  Right mastectomy site without mass  Lymphatics: She has no axillary or supraclavicular adenopathy on either side.  Extremities: Full ROM of the surgical side with min  lymphedema noted.  Data Reviewed: Oncology  notes  Impression: Doing well, with no evidence of recurrent cancer or new cancer  Plan: Will continue to follow up on an biannual basis here.

## 2014-02-20 NOTE — Patient Instructions (Signed)
Return 6 months

## 2014-03-06 ENCOUNTER — Other Ambulatory Visit: Payer: BC Managed Care – PPO

## 2014-03-13 ENCOUNTER — Encounter: Payer: BC Managed Care – PPO | Admitting: Internal Medicine

## 2014-03-21 ENCOUNTER — Telehealth: Payer: Self-pay | Admitting: *Deleted

## 2014-03-21 ENCOUNTER — Other Ambulatory Visit: Payer: Self-pay | Admitting: *Deleted

## 2014-03-21 DIAGNOSIS — C50919 Malignant neoplasm of unspecified site of unspecified female breast: Secondary | ICD-10-CM

## 2014-03-21 NOTE — Telephone Encounter (Signed)
Received refill request for [place] con 20 meq.  Spoke with pt and was informed that pt takes 3 tablets = 60 meq daily.  Pt last had labs done 02/16/14 with potassium level 3.3 ;  Ria Comment, NP notified.   Per Ria Comment, pt needs to have lab rechecked prior to [place] con refill.  Spoke with pt and informed pt of NP's instructions.  Gave pt appt date and time for lab  03/22/14.  Pt voiced understanding.

## 2014-03-22 ENCOUNTER — Other Ambulatory Visit (HOSPITAL_BASED_OUTPATIENT_CLINIC_OR_DEPARTMENT_OTHER): Payer: BC Managed Care – PPO

## 2014-03-22 DIAGNOSIS — C773 Secondary and unspecified malignant neoplasm of axilla and upper limb lymph nodes: Secondary | ICD-10-CM

## 2014-03-22 DIAGNOSIS — C50919 Malignant neoplasm of unspecified site of unspecified female breast: Secondary | ICD-10-CM

## 2014-03-22 LAB — COMPREHENSIVE METABOLIC PANEL (CC13)
ALT: 11 U/L (ref 0–55)
ANION GAP: 10 meq/L (ref 3–11)
AST: 18 U/L (ref 5–34)
Albumin: 4 g/dL (ref 3.5–5.0)
Alkaline Phosphatase: 123 U/L (ref 40–150)
BUN: 9.4 mg/dL (ref 7.0–26.0)
CALCIUM: 9.9 mg/dL (ref 8.4–10.4)
CHLORIDE: 102 meq/L (ref 98–109)
CO2: 29 mEq/L (ref 22–29)
CREATININE: 0.9 mg/dL (ref 0.6–1.1)
Glucose: 133 mg/dl (ref 70–140)
Potassium: 3.6 mEq/L (ref 3.5–5.1)
Sodium: 141 mEq/L (ref 136–145)
Total Bilirubin: 0.4 mg/dL (ref 0.20–1.20)
Total Protein: 7.3 g/dL (ref 6.4–8.3)

## 2014-03-23 ENCOUNTER — Telehealth: Payer: Self-pay | Admitting: *Deleted

## 2014-03-23 ENCOUNTER — Other Ambulatory Visit: Payer: Self-pay | Admitting: *Deleted

## 2014-03-23 DIAGNOSIS — C50919 Malignant neoplasm of unspecified site of unspecified female breast: Secondary | ICD-10-CM

## 2014-03-23 DIAGNOSIS — E876 Hypokalemia: Secondary | ICD-10-CM

## 2014-03-23 MED ORDER — POTASSIUM CHLORIDE CRYS ER 20 MEQ PO TBCR: 20.0000 meq | EXTENDED_RELEASE_TABLET | Freq: Two times a day (BID) | ORAL | Status: DC

## 2014-03-23 NOTE — Telephone Encounter (Signed)
Spoke with pt and informed pt re:  Ria Comment, NP reviewed lab results done 03/22/14.  Per NP,  Pt can take Klor-con 41meq by mouth Twice  Daily now.  Gave pt appt for lab rechecked in 1 month as per NP's instructions.  Pt voiced understanding.

## 2014-04-19 ENCOUNTER — Other Ambulatory Visit (HOSPITAL_BASED_OUTPATIENT_CLINIC_OR_DEPARTMENT_OTHER): Payer: BC Managed Care – PPO

## 2014-04-19 DIAGNOSIS — C50919 Malignant neoplasm of unspecified site of unspecified female breast: Secondary | ICD-10-CM

## 2014-04-19 DIAGNOSIS — C773 Secondary and unspecified malignant neoplasm of axilla and upper limb lymph nodes: Secondary | ICD-10-CM

## 2014-04-19 LAB — COMPREHENSIVE METABOLIC PANEL (CC13)
ALBUMIN: 3.8 g/dL (ref 3.5–5.0)
ALT: 7 U/L (ref 0–55)
AST: 16 U/L (ref 5–34)
Alkaline Phosphatase: 117 U/L (ref 40–150)
Anion Gap: 11 mEq/L (ref 3–11)
BUN: 14.3 mg/dL (ref 7.0–26.0)
CALCIUM: 9.7 mg/dL (ref 8.4–10.4)
CHLORIDE: 104 meq/L (ref 98–109)
CO2: 27 mEq/L (ref 22–29)
Creatinine: 0.9 mg/dL (ref 0.6–1.1)
Glucose: 151 mg/dl — ABNORMAL HIGH (ref 70–140)
POTASSIUM: 3.5 meq/L (ref 3.5–5.1)
Sodium: 142 mEq/L (ref 136–145)
Total Bilirubin: 0.53 mg/dL (ref 0.20–1.20)
Total Protein: 7.1 g/dL (ref 6.4–8.3)

## 2014-05-16 ENCOUNTER — Other Ambulatory Visit: Payer: Self-pay

## 2014-05-16 DIAGNOSIS — C50919 Malignant neoplasm of unspecified site of unspecified female breast: Secondary | ICD-10-CM

## 2014-05-17 ENCOUNTER — Other Ambulatory Visit (HOSPITAL_BASED_OUTPATIENT_CLINIC_OR_DEPARTMENT_OTHER): Payer: BC Managed Care – PPO

## 2014-05-17 DIAGNOSIS — C50919 Malignant neoplasm of unspecified site of unspecified female breast: Secondary | ICD-10-CM

## 2014-05-17 DIAGNOSIS — C50819 Malignant neoplasm of overlapping sites of unspecified female breast: Secondary | ICD-10-CM

## 2014-05-17 LAB — COMPREHENSIVE METABOLIC PANEL (CC13)
ALK PHOS: 128 U/L (ref 40–150)
ALT: 14 U/L (ref 0–55)
AST: 17 U/L (ref 5–34)
Albumin: 3.9 g/dL (ref 3.5–5.0)
Anion Gap: 12 mEq/L — ABNORMAL HIGH (ref 3–11)
BUN: 12.9 mg/dL (ref 7.0–26.0)
CO2: 27 mEq/L (ref 22–29)
Calcium: 10.1 mg/dL (ref 8.4–10.4)
Chloride: 104 mEq/L (ref 98–109)
Creatinine: 0.9 mg/dL (ref 0.6–1.1)
Glucose: 147 mg/dl — ABNORMAL HIGH (ref 70–140)
Potassium: 3.6 mEq/L (ref 3.5–5.1)
SODIUM: 142 meq/L (ref 136–145)
Total Bilirubin: 0.48 mg/dL (ref 0.20–1.20)
Total Protein: 7.4 g/dL (ref 6.4–8.3)

## 2014-05-17 LAB — CBC WITH DIFFERENTIAL/PLATELET
BASO%: 0.2 % (ref 0.0–2.0)
Basophils Absolute: 0 10*3/uL (ref 0.0–0.1)
EOS%: 2.6 % (ref 0.0–7.0)
Eosinophils Absolute: 0.1 10*3/uL (ref 0.0–0.5)
HCT: 36.4 % (ref 34.8–46.6)
HGB: 12 g/dL (ref 11.6–15.9)
LYMPH%: 28.8 % (ref 14.0–49.7)
MCH: 30.5 pg (ref 25.1–34.0)
MCHC: 33 g/dL (ref 31.5–36.0)
MCV: 92.6 fL (ref 79.5–101.0)
MONO#: 0.5 10*3/uL (ref 0.1–0.9)
MONO%: 10.5 % (ref 0.0–14.0)
NEUT#: 2.6 10*3/uL (ref 1.5–6.5)
NEUT%: 57.9 % (ref 38.4–76.8)
Platelets: 241 10*3/uL (ref 145–400)
RBC: 3.93 10*6/uL (ref 3.70–5.45)
RDW: 13.6 % (ref 11.2–14.5)
WBC: 4.5 10*3/uL (ref 3.9–10.3)
lymph#: 1.3 10*3/uL (ref 0.9–3.3)

## 2014-05-24 ENCOUNTER — Ambulatory Visit (HOSPITAL_BASED_OUTPATIENT_CLINIC_OR_DEPARTMENT_OTHER): Payer: BC Managed Care – PPO | Admitting: Hematology and Oncology

## 2014-05-24 ENCOUNTER — Ambulatory Visit: Payer: BC Managed Care – PPO | Admitting: Hematology and Oncology

## 2014-05-24 ENCOUNTER — Telehealth: Payer: Self-pay | Admitting: Hematology and Oncology

## 2014-05-24 VITALS — BP 134/78 | HR 94 | Temp 98.3°F | Resp 18 | Ht 63.0 in | Wt 218.0 lb

## 2014-05-24 DIAGNOSIS — C50912 Malignant neoplasm of unspecified site of left female breast: Secondary | ICD-10-CM

## 2014-05-24 DIAGNOSIS — Z853 Personal history of malignant neoplasm of breast: Secondary | ICD-10-CM

## 2014-05-24 NOTE — Telephone Encounter (Signed)
per pod to sch pt appt-gave pt copy of sch

## 2014-05-24 NOTE — Assessment & Plan Note (Addendum)
Stage III, triple negative, invasive ductal carcinoma of the left breast. She has completed adjuvant radiation with concurrent Xeloda. She is doing well today. She has no sign of recurrence. 3.9 cm size and 9/13 lymph nodes were positive with extracapsular extension. ER/PR negative HER-2 negative Ki-67 21%  Surveillance: Patient is doing well without any evidence of disease recurrence. Since she had bilateral mastectomies, there is no role of imaging studies. I reviewed today's blood work which was normal. Survivorship: Patient continues to have fatigue, generalized weakness, neuropathy symptoms related to prior chemotherapy. She is participating at [facility] and getting exercise. I encouraged her strongly to continue to do so. Trigger finger right hand: I encouraged her to use massage and coils in the sinus the fingers to enable her to get some relief. 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  Return to clinic in 6 months for followup on labs.

## 2014-05-24 NOTE — Progress Notes (Signed)
Patient Care Team: Ricard Dillon, MD as PCP - General  DIAGNOSIS: Breast cancer of upper-outer quadrant of left female breast   Primary site: Breast (Left)   Staging method: AJCC 7th Edition   Clinical: Stage IIIB (T4, N1, cM0) signed by Thea Silversmith, MD on 10/29/2012  7:49 PM   Summary: Stage IIIB (T4, N1, cM0)   Prognostic indicators: ER-, PR- HER2 -  Prior Therapy:  1. Patient has history of right breast cancer patient underwent a mastectomy when she was 58 years old. This occurred during her pregnancy. After that patient did not take any kind of further treatments. She continued to do well.   2. The patient noticed that her left nipple had become itchy and retracted. She underwent mammogram with ultrasound that showed 2 areas of concern in the left breast (at the 12 o'clock position and 3 o'clock position). An ultrasound performed that showed the diameter to be at least 6 cm and multiple left axillary lymph nodes that were abnormal. A left breast needle core biopsy performed on 09/30/2012 at the 2 o'clock position showed invasive ductal carcinoma grade 3, estrogen receptor negative, progesterone receptor negative,proliferation marker Ki-67 21%, HER-2/neu negative. Left breast needle core biopsy at the 3 o'clock position showed grade 3 ductal carcinoma in situ.   3. Bilateral breast MRI on 10/12/2012 showed a previous right breast mastectomy and marked diffuse enhancement involving majority of the left breast measuring 13.0 x 12.5 x 9.5 cm. There was diffuse skin thickening and dermal enhancement. Multiple enlarged level I left axillary lymph nodes. The largest lymph node measured 2.4 cm in size. The large area of enhancement within the left breast extends posteriorly to abut the anterior portion of the pectoralis muscle. There was no evidence of internal mammary adenopathy or right axillary adenopathy.   4. PET/CT did show the left breast mass, and left axillary and subpectoralis nodal  metastases, but no further areas throughout the chest/abd/pelvis of metastases.   5. Patient received 6 cycles of neoadjuvant chemotherapy consisting of FEC (5-FU/epirubicin/Cytoxan) from 11/02/2012 through 01/25/2013 with Neulasta support. This was followed by 8 cycles of weekly Taxol/Carbo that started from 02/08/2013 through 04/19/2013. 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 She began neoadjuvant chemotherapy consisting of Gemzar/Carboplatin on 05/24/2013 with day 2 Neulasta support. Gemzar/carboplatinum completed 06/06/2013 for a total of 2 cycles.   #6 status post left mastectomy 07/19/2013. Pathology revealed: Invasive ductal carcinoma grade 33.9 cm, 9/30 lymph nodes positive, extracapsular extension present  #7 The patient underwent adjuvant radiation with concurrent Xeloda from 09/15/13 through 11/01/13.   Current therapy: Observation  CHIEF COMPLIANT: Trigger finger and fatigue  INTERVAL HISTORY: Christine [name] is a 58 year old African American lady with above-mentioned history of bilateral breast cancers original breast cancer was on the right side over 30 years ago. It occurred breast cancer was diagnosed in 2014 treated with neoadjuvant chemotherapy for triple negative disease. She then underwent a mastectomy that revealed still residual cancer with 9 positive lymph nodes. She underwent radiation therapy and is currently on observation. She reports some problems with her fingers inability to close the fingers and appeared to be slightly swollen. She also has fatigue and exercise. She is trying to do exercise at [facility].  REVIEW OF SYSTEMS:   Constitutional: Denies fevers, chills or abnormal weight loss, fatigue present Eyes: Denies blurriness of vision Ears, nose, mouth, throat, and face: Denies mucositis or sore throat Respiratory: Denies cough, dyspnea  or wheezes Cardiovascular: Denies  palpitation, chest discomfort or lower extremity swelling Gastrointestinal:  Denies nausea, heartburn or change in bowel habits Skin: Denies abnormal skin rashes Lymphatics: Denies new lymphadenopathy or easy bruising Neurological:Denies numbness, tingling or new weaknesses Behavioral/Psych: Mood is stable, no new changes  Breast: Chest wall is normal without any nodules All other systems were reviewed with the patient and are negative.  I have reviewed the past medical history, past surgical history, social history and family history with the patient and they are unchanged from previous note.  ALLERGIES:  is allergic to sulfa antibiotics and sulfonamide derivatives.  MEDICATIONS:  Current Outpatient Prescriptions  Medication Sig Dispense Refill  . cholecalciferol (VITAMIN D) 1000 UNITS tablet Take 1,000 Units by mouth daily.      Marland Kitchen diltiazem (CARDIZEM CD) 240 MG 24 hr capsule TAKE ONE CAPSULE BY MOUTH EVERY DAY  90 capsule  3  . fluticasone (FLONASE) 50 MCG/ACT nasal spray PLACE 2 SPRAYS INTO THE NOSE DAILY.  16 g  0  . gabapentin (NEURONTIN) 100 MG capsule Take 100 mg by mouth at bedtime.      Marland Kitchen latanoprost (XALATAN) 0.005 % ophthalmic solution Place 1 drop into both eyes at bedtime.       Marland Kitchen levothyroxine (SYNTHROID, LEVOTHROID) 50 MCG tablet TAKE 1 TABLET BY MOUTH EVERY DAY  90 tablet  2  . losartan-hydrochlorothiazide (HYZAAR) 50-12.5 MG per tablet TAKE 1 TABLET BY MOUTH DAILY.  90 tablet  3  . NEXIUM 40 MG capsule TAKE 1 CAPSULE (40 MG TOTAL) BY MOUTH DAILY BEFORE BREAKFAST.  90 capsule  1  . NON FORMULARY Fungifoam, Rx by podiatrist and uses as directed      . potassium chloride SA (K-DUR,KLOR-CON) 20 MEQ tablet Take 1 tablet (20 mEq total) by mouth 2 (two) times daily.  90 tablet  0  . UNABLE TO FIND L8015 Post-Mastectomy Camisole #2 L8000 Post-Surgical Bras #6 [id number] Silicone Breast Prosthesis #2 174.9  1 each  0  . UNABLE TO FIND G5692 Mastectomy Forms - 6 mths 1 refill  1 each   0  . LORazepam (ATIVAN) 0.5 MG tablet Take 1 tablet (0.5 mg total) by mouth every 8 (eight) hours.  30 tablet  0   No current facility-administered medications for this visit.    PHYSICAL EXAMINATION: ECOG PERFORMANCE STATUS: 1 - Symptomatic but completely ambulatory  Filed Vitals:   05/24/14 1307  BP: 134/78  Pulse: 94  Temp: 98.3 F (36.8 C)  Resp: 18   Filed Weights   05/24/14 1307  Weight: 218 lb (98.884 kg)    GENERAL:alert, no distress and comfortable SKIN: skin color, texture, turgor are normal, no rashes or significant lesions EYES: normal, Conjunctiva are pink and non-injected, sclera clear OROPHARYNX:no exudate, no erythema and lips, buccal mucosa, and tongue normal  NECK: supple, thyroid normal size, non-tender, without nodularity LYMPH:  no palpable lymphadenopathy in the cervical, axillary or inguinal LUNGS: clear to auscultation and percussion with normal breathing effort HEART: regular rate & rhythm and no murmurs and no lower extremity edema ABDOMEN:abdomen soft, non-tender and normal bowel sounds Musculoskeletal:no cyanosis of digits and no clubbing  NEURO: alert & oriented x 3 with fluent speech, no focal motor/sensory deficits   LABORATORY DATA:  I have reviewed the data as listed   Chemistry      Component Value Date/Time   NA 142 05/17/2014 1103   NA 141 12/14/2013 1036   K 3.6 05/17/2014 1103   K 3.0* 12/14/2013  1036   CL 102 12/14/2013 1036   CL 101 01/25/2013 0952   CO2 27 05/17/2014 1103   CO2 31 12/14/2013 1036   BUN 12.9 05/17/2014 1103   BUN 12 12/14/2013 1036   CREATININE 0.9 05/17/2014 1103   CREATININE 0.8 12/14/2013 1036      Component Value Date/Time   CALCIUM 10.1 05/17/2014 1103   CALCIUM 9.5 12/14/2013 1036   ALKPHOS 128 05/17/2014 1103   ALKPHOS 85 12/14/2013 1036   AST 17 05/17/2014 1103   AST 21 12/14/2013 1036   ALT 14 05/17/2014 1103   ALT 13 12/14/2013 1036   BILITOT 0.48 05/17/2014 1103   BILITOT 0.6 12/14/2013 1036        Lab Results  Component Value Date   WBC 4.5 05/17/2014   HGB 12.0 05/17/2014   HCT 36.4 05/17/2014   MCV 92.6 05/17/2014   PLT 241 05/17/2014   NEUTROABS 2.6 05/17/2014     RADIOGRAPHIC STUDIES: I have personally reviewed the radiology reports and agreed with their findings. No results found.   ASSESSMENT & PLAN:  Breast cancer of upper-outer quadrant of left female breast Stage III, triple negative, invasive ductal carcinoma of the left breast. She has completed adjuvant radiation with concurrent Xeloda. She is doing well today. She has no sign of recurrence. 3.9 cm size and 9/13 lymph nodes were positive with extracapsular extension. ER/PR negative HER-2 negative Ki-67 21%  Surveillance: Patient is doing well without any evidence of disease recurrence. Since she had bilateral mastectomies, there is no role of imaging studies. I reviewed today's blood work which was normal. Survivorship: Patient continues to have fatigue, generalized weakness, neuropathy symptoms related to prior chemotherapy. She is participating at [facility] and getting exercise. I encouraged her strongly to continue to do so. Trigger finger right hand: I encouraged her to use massage and coils in the sinus the fingers to enable her to get some relief. 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  Return to clinic in 6 months for followup on labs.     Orders Placed This Encounter  Procedures  . CBC with Differential    Standing Status: Future     Number of Occurrences:      Standing Expiration Date: 05/24/2015  . Comprehensive metabolic panel (Cmet) - [facility]    Standing Status: Future     Number of Occurrences:      Standing Expiration Date: 05/24/2015   The patient has a good understanding of the overall plan. she agrees with it. She will call with any problems that may develop before her next visit here.  I spent 15 minutes counseling the patient face to face. The total time spent in the  appointment was 20 minutes and more than 50% was on counseling and review of test results    Rulon Eisenmenger, MD 05/24/2014 1:50 PM

## 2014-06-05 ENCOUNTER — Encounter (INDEPENDENT_AMBULATORY_CARE_PROVIDER_SITE_OTHER): Payer: Self-pay | Admitting: Surgery

## 2014-06-12 ENCOUNTER — Telehealth: Payer: Self-pay | Admitting: Internal Medicine

## 2014-06-12 NOTE — Telephone Encounter (Signed)
error 

## 2014-06-14 ENCOUNTER — Other Ambulatory Visit: Payer: Self-pay | Admitting: Adult Health

## 2014-06-14 ENCOUNTER — Other Ambulatory Visit: Payer: Self-pay | Admitting: *Deleted

## 2014-06-14 DIAGNOSIS — C50412 Malignant neoplasm of upper-outer quadrant of left female breast: Secondary | ICD-10-CM

## 2014-06-27 ENCOUNTER — Ambulatory Visit (INDEPENDENT_AMBULATORY_CARE_PROVIDER_SITE_OTHER)
Admission: RE | Admit: 2014-06-27 | Discharge: 2014-06-27 | Disposition: A | Discharge status: Home / Self Care | Payer: BC Managed Care – PPO | Source: Ambulatory Visit | Attending: Family | Admitting: Family

## 2014-06-27 ENCOUNTER — Encounter: Payer: Self-pay | Admitting: Family

## 2014-06-27 ENCOUNTER — Ambulatory Visit (INDEPENDENT_AMBULATORY_CARE_PROVIDER_SITE_OTHER): Payer: BC Managed Care – PPO | Admitting: Family

## 2014-06-27 VITALS — BP 110/60 | HR 97 | Wt 220.3 lb

## 2014-06-27 DIAGNOSIS — I890 Lymphedema, not elsewhere classified: Secondary | ICD-10-CM

## 2014-06-27 DIAGNOSIS — Z853 Personal history of malignant neoplasm of breast: Secondary | ICD-10-CM

## 2014-06-27 DIAGNOSIS — M19041 Primary osteoarthritis, right hand: Secondary | ICD-10-CM

## 2014-06-27 NOTE — Progress Notes (Signed)
Pre visit review using our clinic review tool, if applicable. No additional management support is needed unless otherwise documented below in the visit note. 

## 2014-06-27 NOTE — Progress Notes (Signed)
Subjective:    Patient ID: Christine [name], female    DOB: 06-14-1956, 58 y.o.   MRN: [id number]  HPI  58 year old African American female, nonsmoker with c/o right hand pain and stiffness since July. Reports doing some strength training at the [facility] that she feels may have flared it. Massaging helps. Aleve helps some. Pain 9/10, worse in the mornings and after a nap. Pain primarily in the index and middle finger. She has a history of breast cancer and has lyphedema to the left arm and hand. Has neuropathy as a result of chemotherapy.   Review of Systems  Constitutional: Negative.   Respiratory: Negative.   Cardiovascular: Negative.   Gastrointestinal: Negative.   Genitourinary: Negative.   Musculoskeletal: Positive for arthralgias.       Right hand: index and middle finger stiffness and pain  Skin: Negative.   Allergic/Immunologic: Negative.   Neurological: Negative.   Psychiatric/Behavioral: Negative.    Past Medical History  Diagnosis Date  . ALLERGIC RHINITIS   . GERD (gastroesophageal reflux disease)   . Hypertension   . Hypothyroidism   . Hx of colonic polyps   . Wears glasses   . Breast cancer 31, age 58    right  . Breast cancer 09/30/12    left, ER/PR -, Her 2 -  . Radiation 09/15/13-11/01/13    Left chest wall/PAB/scar/supraclavicular fossa  . SYNCOPE 05/15/2010    Qualifier: Diagnosis of  By: Arnoldo Morale MD, [place], WITH RADICULOPATHY 10/01/2007    Qualifier: Diagnosis of  By: Arnoldo Morale MD, [place] 05/31/2007    Qualifier: Diagnosis of  By: Arnoldo Morale MD, [place], HX OF 05/31/2007    Qualifier: Diagnosis of  By: Arnoldo Morale MD, Balinda [place]   . Lymph edema L arm from breast ca tx 12/22/2013    History   Social History  . Marital Status: Married    Spouse Name: N/A    Number of Children: N/A  . Years of Education: N/A   Occupational History  . Not on file.   Social History Main Topics  . Smoking status: Never Smoker   .  Smokeless tobacco: Never Used  . Alcohol Use: No  . Drug Use: No  . Sexual Activity: Not Currently     Comment: menarche 50, 1st pregnancy age 67-miscarr, age 36 1st live birth, menop 42   Other Topics Concern  . Not on file   Social History Narrative   Work or School: not currently working - retiring      Home Situation: lives with husband      Spiritual Beliefs:Christian      Lifestyle: started the [employer] exercise program for cancer survivors (12/2013); working on diet             Past Surgical History  Procedure Laterality Date  . Tonsillectomy    . Caesarean section      x 1  . Mastectomy  04/1987    right side  . Abdominal hysterectomy  06/1988    fibroids  . Portacath placement Right 10/28/2012    Procedure: PORT PLACEMENT;  Surgeon: Joyice Faster. Cornett, MD;  Location: [place];  Service: General;  Laterality: Right;  Right Subclavian Vein  . Mastectomy modified radical Left 07/19/2013    Procedure: MASTECTOMY MODIFIED RADICAL;  Surgeon: Marcello Moores A. Cornett, MD;  Location: [place];  Service: General;  Laterality: Left;  .  Port-a-cath removal Right 07/19/2013    Procedure: REMOVAL PORT-A-CATH;  Surgeon: Joyice Faster. Cornett, MD;  Location: [place];  Service: General;  Laterality: Right;  . Breast surgery Left 07/19/13    MRM    Family History  Problem Relation Age of Onset  . Colon cancer Mother 32    family hx of colon ca 1st degree relative <60  . Coronary artery disease Father     family hx of CAD female 1st degree relative <50  . Stomach cancer Neg Hx   . Rectal cancer Neg Hx   . Esophageal cancer Neg Hx   . Breast cancer Maternal Grandmother 80  . Diabetes Paternal Grandmother   . Breast cancer Cousin 82    maternal cousin  . Coronary artery disease Maternal Grandfather   . Breast cancer Sister     paternal half sister; died in her 9s    Allergies  Allergen Reactions  . Sulfa Antibiotics Hives, Itching and  Swelling  . Sulfonamide Derivatives Hives, Itching and Swelling    Current Outpatient Prescriptions on File Prior to Visit  Medication Sig Dispense Refill  . cholecalciferol (VITAMIN D) 1000 UNITS tablet Take 1,000 Units by mouth daily.    Marland Kitchen diltiazem (CARDIZEM CD) 240 MG 24 hr capsule TAKE ONE CAPSULE BY MOUTH EVERY DAY 90 capsule 3  . fluticasone (FLONASE) 50 MCG/ACT nasal spray PLACE 2 SPRAYS INTO THE NOSE DAILY. 16 g 0  . gabapentin (NEURONTIN) 100 MG capsule Take 100 mg by mouth at bedtime.    Marland Kitchen KLOR-CON M20 20 MEQ tablet TAKE 1 TABLET (20 MEQ TOTAL) BY MOUTH 2 (TWO) TIMES DAILY. 60 tablet 0  . latanoprost (XALATAN) 0.005 % ophthalmic solution Place 1 drop into both eyes at bedtime.     Marland Kitchen levothyroxine (SYNTHROID, LEVOTHROID) 50 MCG tablet TAKE 1 TABLET BY MOUTH EVERY DAY 90 tablet 2  . LORazepam (ATIVAN) 0.5 MG tablet Take 1 tablet (0.5 mg total) by mouth every 8 (eight) hours. 30 tablet 0  . losartan-hydrochlorothiazide (HYZAAR) 50-12.5 MG per tablet TAKE 1 TABLET BY MOUTH DAILY. 90 tablet 3  . NEXIUM 40 MG capsule TAKE 1 CAPSULE (40 MG TOTAL) BY MOUTH DAILY BEFORE BREAKFAST. 90 capsule 1  . NON FORMULARY Fungifoam, Rx by podiatrist and uses as directed    . UNABLE TO FIND L8015 Post-Mastectomy Camisole #2 L8000 Post-Surgical Bras #6 [id number] Silicone Breast Prosthesis #2 174.9 1 each 0  . UNABLE TO FIND R4431 Mastectomy Forms - 6 mths 1 refill 1 each 0   No current facility-administered medications on file prior to visit.    BP 110/60 mmHg  Pulse 97  Wt 220 lb 4.8 oz (99.927 kg)chart    Objective:   Physical Exam  Constitutional: She is oriented to person, place, and time. She appears well-developed and well-nourished.  Neck: Normal range of motion. Neck supple.  Cardiovascular: Normal rate, regular rhythm and normal heart sounds.   Pulmonary/Chest: Effort normal and breath sounds normal.  Musculoskeletal: Normal range of motion. She exhibits no tenderness.  Lymphedema noted  to the left arm and hand. None present to the right.   Nontender to palpation of the right index and middles finger. No swelling. Positive crepitus to the right index finger.   Neurological: She is alert and oriented to person, place, and time.  Skin: Skin is warm and dry.  Psychiatric: She has a normal mood and affect.          Assessment & Plan:  Christine [name] was seen today for hand pain.  Diagnoses and associated orders for this visit:  Osteoarthrosis of right hand - DG Hand Complete Right; Future  History of breast cancer  Lymphedema    Xray ordered to support osteoarthritis diagnosis. Consider anti-inflammatory.

## 2014-06-27 NOTE — Patient Instructions (Signed)

## 2014-06-28 ENCOUNTER — Other Ambulatory Visit: Payer: Self-pay | Admitting: Family

## 2014-06-28 MED ORDER — METHYLPREDNISOLONE 4 MG PO KIT: PACK | ORAL | Status: AC

## 2014-08-02 ENCOUNTER — Other Ambulatory Visit: Payer: Self-pay | Admitting: *Deleted

## 2014-08-02 MED ORDER — LOSARTAN POTASSIUM-HCTZ 50-12.5 MG PO TABS: 1.0000 | ORAL_TABLET | Freq: Every day | ORAL | Status: DC

## 2014-08-02 NOTE — Telephone Encounter (Signed)
Rx sent 

## 2014-08-04 DIAGNOSIS — C7800 Secondary malignant neoplasm of unspecified lung: Secondary | ICD-10-CM

## 2014-08-04 HISTORY — DX: Secondary malignant neoplasm of unspecified lung: C78.00

## 2014-08-22 ENCOUNTER — Encounter: Payer: Self-pay | Admitting: Family Medicine

## 2014-08-29 ENCOUNTER — Other Ambulatory Visit: Payer: Self-pay | Admitting: Family Medicine

## 2014-09-20 ENCOUNTER — Telehealth: Payer: Self-pay

## 2014-09-20 NOTE — Telephone Encounter (Signed)
Rx sent on 1/26

## 2014-09-20 NOTE — Telephone Encounter (Signed)
[facility] refill request for LOSARTAN-HCTZ 50-12.5MG 

## 2014-09-22 ENCOUNTER — Telehealth: Payer: Self-pay | Admitting: Family Medicine

## 2014-09-22 MED ORDER — LOSARTAN POTASSIUM-HCTZ 50-12.5 MG PO TABS: 1.0000 | ORAL_TABLET | Freq: Every day | ORAL | Status: DC

## 2014-09-22 NOTE — Telephone Encounter (Signed)
Rx done. 

## 2014-09-22 NOTE — Telephone Encounter (Signed)
Pt needs losartan-hctz #90 with refill. Per ins pt must get 90 day supply [facility]

## 2014-09-24 ENCOUNTER — Other Ambulatory Visit: Payer: Self-pay | Admitting: Family Medicine

## 2014-10-06 ENCOUNTER — Other Ambulatory Visit: Payer: Self-pay | Admitting: *Deleted

## 2014-10-06 MED ORDER — LEVOTHYROXINE SODIUM 50 MCG PO TABS: 50.0000 ug | ORAL_TABLET | Freq: Every day | ORAL | Status: DC

## 2014-10-30 ENCOUNTER — Other Ambulatory Visit: Payer: Self-pay

## 2014-10-30 ENCOUNTER — Inpatient Hospital Stay (HOSPITAL_BASED_OUTPATIENT_CLINIC_OR_DEPARTMENT_OTHER)
Admission: EM | Admit: 2014-10-30 | Discharge: 2014-10-31 | DRG: 181 | Disposition: A | Discharge status: Home / Self Care | Payer: BLUE CROSS/BLUE SHIELD | Attending: Internal Medicine | Admitting: Internal Medicine

## 2014-10-30 ENCOUNTER — Emergency Department (HOSPITAL_BASED_OUTPATIENT_CLINIC_OR_DEPARTMENT_OTHER): Payer: BLUE CROSS/BLUE SHIELD

## 2014-10-30 ENCOUNTER — Encounter (HOSPITAL_BASED_OUTPATIENT_CLINIC_OR_DEPARTMENT_OTHER): Payer: Self-pay | Admitting: *Deleted

## 2014-10-30 ENCOUNTER — Inpatient Hospital Stay (HOSPITAL_COMMUNITY): Payer: BLUE CROSS/BLUE SHIELD

## 2014-10-30 DIAGNOSIS — K219 Gastro-esophageal reflux disease without esophagitis: Secondary | ICD-10-CM | POA: Diagnosis present

## 2014-10-30 DIAGNOSIS — Z800 Family history of malignant neoplasm of digestive organs: Secondary | ICD-10-CM

## 2014-10-30 DIAGNOSIS — E119 Type 2 diabetes mellitus without complications: Secondary | ICD-10-CM | POA: Diagnosis present

## 2014-10-30 DIAGNOSIS — F329 Major depressive disorder, single episode, unspecified: Secondary | ICD-10-CM | POA: Diagnosis present

## 2014-10-30 DIAGNOSIS — Z9010 Acquired absence of unspecified breast and nipple: Secondary | ICD-10-CM | POA: Diagnosis present

## 2014-10-30 DIAGNOSIS — R112 Nausea with vomiting, unspecified: Secondary | ICD-10-CM

## 2014-10-30 DIAGNOSIS — Z9889 Other specified postprocedural states: Secondary | ICD-10-CM

## 2014-10-30 DIAGNOSIS — Z882 Allergy status to sulfonamides status: Secondary | ICD-10-CM

## 2014-10-30 DIAGNOSIS — C7801 Secondary malignant neoplasm of right lung: Secondary | ICD-10-CM

## 2014-10-30 DIAGNOSIS — C50911 Malignant neoplasm of unspecified site of right female breast: Secondary | ICD-10-CM | POA: Diagnosis present

## 2014-10-30 DIAGNOSIS — R7301 Impaired fasting glucose: Secondary | ICD-10-CM

## 2014-10-30 DIAGNOSIS — C7802 Secondary malignant neoplasm of left lung: Secondary | ICD-10-CM

## 2014-10-30 DIAGNOSIS — Z833 Family history of diabetes mellitus: Secondary | ICD-10-CM

## 2014-10-30 DIAGNOSIS — Z853 Personal history of malignant neoplasm of breast: Secondary | ICD-10-CM

## 2014-10-30 DIAGNOSIS — R9431 Abnormal electrocardiogram [ECG] [EKG]: Secondary | ICD-10-CM

## 2014-10-30 DIAGNOSIS — J90 Pleural effusion, not elsewhere classified: Secondary | ICD-10-CM | POA: Diagnosis present

## 2014-10-30 DIAGNOSIS — Z86010 Personal history of colonic polyps: Secondary | ICD-10-CM

## 2014-10-30 DIAGNOSIS — E039 Hypothyroidism, unspecified: Secondary | ICD-10-CM | POA: Diagnosis present

## 2014-10-30 DIAGNOSIS — F419 Anxiety disorder, unspecified: Secondary | ICD-10-CM | POA: Diagnosis present

## 2014-10-30 DIAGNOSIS — Z8249 Family history of ischemic heart disease and other diseases of the circulatory system: Secondary | ICD-10-CM

## 2014-10-30 DIAGNOSIS — Z79899 Other long term (current) drug therapy: Secondary | ICD-10-CM | POA: Diagnosis not present

## 2014-10-30 DIAGNOSIS — R911 Solitary pulmonary nodule: Secondary | ICD-10-CM | POA: Diagnosis present

## 2014-10-30 DIAGNOSIS — E876 Hypokalemia: Secondary | ICD-10-CM

## 2014-10-30 DIAGNOSIS — Z803 Family history of malignant neoplasm of breast: Secondary | ICD-10-CM

## 2014-10-30 DIAGNOSIS — C7800 Secondary malignant neoplasm of unspecified lung: Principal | ICD-10-CM | POA: Diagnosis present

## 2014-10-30 DIAGNOSIS — E038 Other specified hypothyroidism: Secondary | ICD-10-CM | POA: Diagnosis not present

## 2014-10-30 DIAGNOSIS — C50912 Malignant neoplasm of unspecified site of left female breast: Secondary | ICD-10-CM | POA: Diagnosis present

## 2014-10-30 DIAGNOSIS — I10 Essential (primary) hypertension: Secondary | ICD-10-CM

## 2014-10-30 DIAGNOSIS — R918 Other nonspecific abnormal finding of lung field: Secondary | ICD-10-CM

## 2014-10-30 LAB — CREATININE, SERUM
Creatinine, Ser: 0.74 mg/dL (ref 0.50–1.10)
GFR calc Af Amer: >90 mL/min (ref >90)
GFR calc non Af Amer: >90 mL/min (ref >90)

## 2014-10-30 LAB — URINALYSIS, ROUTINE W REFLEX MICROSCOPIC
Bilirubin Urine: NEGATIVE
Glucose, UA: 250 mg/dL — AB
Hgb urine dipstick: NEGATIVE
Ketones, ur: NEGATIVE mg/dL
Leukocytes, UA: NEGATIVE
Nitrite: NEGATIVE
Protein, ur: NEGATIVE mg/dL
SPECIFIC GRAVITY, URINE: 1.013 (ref 1.005–1.030)
UROBILINOGEN UA: 0.2 mg/dL (ref 0.0–1.0)
pH: 7 (ref 5.0–8.0)

## 2014-10-30 LAB — TROPONIN I
Troponin I: <0.03 ng/mL (ref <0.031)
Troponin I: 0.03 ng/mL (ref <0.031)
Troponin I: 0.03 ng/mL (ref <0.031)

## 2014-10-30 LAB — COMPREHENSIVE METABOLIC PANEL
ALK PHOS: 95 U/L (ref 39–117)
ALT: 21 U/L (ref 0–35)
AST: 31 U/L (ref 0–37)
Albumin: 4.2 g/dL (ref 3.5–5.2)
Anion gap: 11 (ref 5–15)
BILIRUBIN TOTAL: 0.1 mg/dL — AB (ref 0.3–1.2)
BUN: 11 mg/dL (ref 6–23)
CHLORIDE: 100 mmol/L (ref 96–112)
CO2: 28 mmol/L (ref 19–32)
CREATININE: 0.77 mg/dL (ref 0.50–1.10)
Calcium: 9.1 mg/dL (ref 8.4–10.5)
GFR calc Af Amer: >90 mL/min (ref >90)
GFR calc non Af Amer: >90 mL/min (ref >90)
Glucose, Bld: 253 mg/dL — ABNORMAL HIGH (ref 70–99)
Potassium: 3.3 mmol/L — ABNORMAL LOW (ref 3.5–5.1)
Sodium: 139 mmol/L (ref 135–145)
TOTAL PROTEIN: 7.1 g/dL (ref 6.0–8.3)

## 2014-10-30 LAB — CBC WITH DIFFERENTIAL/PLATELET
Basophils Absolute: 0 10*3/uL (ref 0.0–0.1)
Basophils Relative: 0 % (ref 0–1)
Eosinophils Absolute: 0.1 10*3/uL (ref 0.0–0.7)
Eosinophils Relative: 1 % (ref 0–5)
HEMATOCRIT: 36.7 % (ref 36.0–46.0)
HEMOGLOBIN: 12.4 g/dL (ref 12.0–15.0)
LYMPHS ABS: 1.1 10*3/uL (ref 0.7–4.0)
LYMPHS PCT: 19 % (ref 12–46)
MCH: 31.8 pg (ref 26.0–34.0)
MCHC: 33.8 g/dL (ref 30.0–36.0)
MCV: 94.1 fL (ref 78.0–100.0)
MONO ABS: 0.5 10*3/uL (ref 0.1–1.0)
MONOS PCT: 8 % (ref 3–12)
NEUTROS ABS: 4.2 10*3/uL (ref 1.7–7.7)
NEUTROS PCT: 71 % (ref 43–77)
Platelets: 228 10*3/uL (ref 150–400)
RBC: 3.9 MIL/uL (ref 3.87–5.11)
RDW: 13 % (ref 11.5–15.5)
WBC: 5.9 10*3/uL (ref 4.0–10.5)

## 2014-10-30 LAB — CBC
HCT: 37.6 % (ref 36.0–46.0)
Hemoglobin: 12.5 g/dL (ref 12.0–15.0)
MCH: 31.1 pg (ref 26.0–34.0)
MCHC: 33.2 g/dL (ref 30.0–36.0)
MCV: 93.5 fL (ref 78.0–100.0)
Platelets: 225 10*3/uL (ref 150–400)
RBC: 4.02 MIL/uL (ref 3.87–5.11)
RDW: 13.2 % (ref 11.5–15.5)
WBC: 7.9 10*3/uL (ref 4.0–10.5)

## 2014-10-30 LAB — GLUCOSE, CAPILLARY: Glucose-Capillary: 150 mg/dL — ABNORMAL HIGH (ref 70–99)

## 2014-10-30 LAB — BRAIN NATRIURETIC PEPTIDE: B NATRIURETIC PEPTIDE 5: 55.3 pg/mL (ref 0.0–100.0)

## 2014-10-30 LAB — TSH: TSH: 1.16 u[IU]/mL (ref 0.350–4.500)

## 2014-10-30 LAB — LIPASE, BLOOD: Lipase: 26 U/L (ref 11–59)

## 2014-10-30 MED ORDER — MORPHINE SULFATE 2 MG/ML IJ SOLN: 2.0000 mg | INTRAMUSCULAR | Status: DC | PRN

## 2014-10-30 MED ORDER — ONDANSETRON HCL 4 MG/2ML IJ SOLN
4.0000 mg | Freq: Four times a day (QID) | INTRAMUSCULAR | Status: DC | PRN
  Administered 2014-10-30: 4 mg via INTRAVENOUS
  Filled 2014-10-30: qty 2

## 2014-10-30 MED ORDER — IOHEXOL 300 MG/ML  SOLN
100.0000 mL | Freq: Once | INTRAMUSCULAR | Status: AC | PRN
  Administered 2014-10-30: 100 mL via INTRAVENOUS

## 2014-10-30 MED ORDER — LORAZEPAM 0.5 MG PO TABS
0.5000 mg | ORAL_TABLET | Freq: Three times a day (TID) | ORAL | Status: DC
  Administered 2014-10-30 – 2014-10-31 (×4): 0.5 mg via ORAL
  Filled 2014-10-30 (×4): qty 1

## 2014-10-30 MED ORDER — INSULIN GLARGINE 100 UNIT/ML ~~LOC~~ SOLN
10.0000 [IU] | Freq: Every day | SUBCUTANEOUS | Status: DC
  Administered 2014-10-30: 10 [IU] via SUBCUTANEOUS
  Filled 2014-10-30 (×2): qty 0.1

## 2014-10-30 MED ORDER — OXYCODONE HCL 5 MG PO TABS: 5.0000 mg | ORAL_TABLET | ORAL | Status: DC | PRN

## 2014-10-30 MED ORDER — ALUM & MAG HYDROXIDE-SIMETH 200-200-20 MG/5ML PO SUSP: 30.0000 mL | Freq: Four times a day (QID) | ORAL | Status: DC | PRN

## 2014-10-30 MED ORDER — ACETAMINOPHEN 650 MG RE SUPP: 650.0000 mg | Freq: Four times a day (QID) | RECTAL | Status: DC | PRN

## 2014-10-30 MED ORDER — INSULIN ASPART 100 UNIT/ML ~~LOC~~ SOLN: 0.0000 [IU] | Freq: Every day | SUBCUTANEOUS | Status: DC

## 2014-10-30 MED ORDER — SODIUM CHLORIDE 0.9 % IV SOLN: 250.0000 mL | INTRAVENOUS | Status: DC | PRN

## 2014-10-30 MED ORDER — POTASSIUM CHLORIDE IN NACL 20-0.9 MEQ/L-% IV SOLN
INTRAVENOUS | Status: DC
  Administered 2014-10-30 – 2014-10-31 (×2): via INTRAVENOUS
  Filled 2014-10-30 (×3): qty 1000

## 2014-10-30 MED ORDER — VITAMIN D3 25 MCG (1000 UNIT) PO TABS
1000.0000 [IU] | ORAL_TABLET | Freq: Every day | ORAL | Status: DC
  Administered 2014-10-30: 1000 [IU] via ORAL
  Filled 2014-10-30 (×2): qty 1

## 2014-10-30 MED ORDER — ENOXAPARIN SODIUM 60 MG/0.6ML ~~LOC~~ SOLN
50.0000 mg | SUBCUTANEOUS | Status: DC
  Administered 2014-10-30: 50 mg via SUBCUTANEOUS
  Filled 2014-10-30 (×3): qty 0.6

## 2014-10-30 MED ORDER — DILTIAZEM HCL ER COATED BEADS 240 MG PO CP24
240.0000 mg | ORAL_CAPSULE | Freq: Every day | ORAL | Status: DC
  Administered 2014-10-30 – 2014-10-31 (×2): 240 mg via ORAL
  Filled 2014-10-30 (×2): qty 1

## 2014-10-30 MED ORDER — SODIUM CHLORIDE 0.9 % IV SOLN: INTRAVENOUS | Status: DC

## 2014-10-30 MED ORDER — SODIUM CHLORIDE 0.9 % IJ SOLN: 3.0000 mL | Freq: Two times a day (BID) | INTRAMUSCULAR | Status: DC

## 2014-10-30 MED ORDER — PANTOPRAZOLE SODIUM 40 MG PO TBEC
40.0000 mg | DELAYED_RELEASE_TABLET | Freq: Every day | ORAL | Status: DC
  Administered 2014-10-31: 40 mg via ORAL
  Filled 2014-10-30: qty 1

## 2014-10-30 MED ORDER — ACETAMINOPHEN 325 MG PO TABS: 650.0000 mg | ORAL_TABLET | Freq: Four times a day (QID) | ORAL | Status: DC | PRN

## 2014-10-30 MED ORDER — LOSARTAN POTASSIUM-HCTZ 50-12.5 MG PO TABS: 1.0000 | ORAL_TABLET | Freq: Every day | ORAL | Status: DC

## 2014-10-30 MED ORDER — POLYETHYLENE GLYCOL 3350 17 G PO PACK: 17.0000 g | PACK | Freq: Every day | ORAL | Status: DC | PRN

## 2014-10-30 MED ORDER — HYDROCHLOROTHIAZIDE 12.5 MG PO CAPS
12.5000 mg | ORAL_CAPSULE | Freq: Every day | ORAL | Status: DC
  Administered 2014-10-30 – 2014-10-31 (×2): 12.5 mg via ORAL
  Filled 2014-10-30 (×2): qty 1

## 2014-10-30 MED ORDER — SODIUM CHLORIDE 0.9 % IJ SOLN: 3.0000 mL | INTRAMUSCULAR | Status: DC | PRN

## 2014-10-30 MED ORDER — ONDANSETRON HCL 4 MG/2ML IJ SOLN
4.0000 mg | Freq: Three times a day (TID) | INTRAMUSCULAR | Status: DC | PRN
  Administered 2014-10-30: 4 mg via INTRAVENOUS
  Filled 2014-10-30: qty 2

## 2014-10-30 MED ORDER — LEVOTHYROXINE SODIUM 50 MCG PO TABS
50.0000 ug | ORAL_TABLET | Freq: Every day | ORAL | Status: DC
  Administered 2014-10-31: 50 ug via ORAL
  Filled 2014-10-30: qty 1

## 2014-10-30 MED ORDER — SODIUM CHLORIDE 0.9 % IV BOLUS (SEPSIS)
1000.0000 mL | Freq: Once | INTRAVENOUS | Status: AC
  Administered 2014-10-30: 1000 mL via INTRAVENOUS

## 2014-10-30 MED ORDER — ONDANSETRON HCL 4 MG/2ML IJ SOLN
4.0000 mg | Freq: Once | INTRAMUSCULAR | Status: AC
  Administered 2014-10-30: 4 mg via INTRAVENOUS
  Filled 2014-10-30: qty 2

## 2014-10-30 MED ORDER — IOHEXOL 300 MG/ML  SOLN
25.0000 mL | INTRAMUSCULAR | Status: AC
  Administered 2014-10-30 (×2): 25 mL via ORAL

## 2014-10-30 MED ORDER — ONDANSETRON HCL 4 MG PO TABS: 4.0000 mg | ORAL_TABLET | Freq: Four times a day (QID) | ORAL | Status: DC | PRN

## 2014-10-30 MED ORDER — POTASSIUM CHLORIDE CRYS ER 20 MEQ PO TBCR: 20.0000 meq | EXTENDED_RELEASE_TABLET | Freq: Every day | ORAL | Status: DC

## 2014-10-30 MED ORDER — INSULIN ASPART 100 UNIT/ML ~~LOC~~ SOLN
0.0000 [IU] | Freq: Three times a day (TID) | SUBCUTANEOUS | Status: DC
  Administered 2014-10-30 – 2014-10-31 (×2): 2 [IU] via SUBCUTANEOUS

## 2014-10-30 MED ORDER — LATANOPROST 0.005 % OP SOLN
1.0000 [drp] | Freq: Every day | OPHTHALMIC | Status: DC
  Administered 2014-10-30: 1 [drp] via OPHTHALMIC
  Filled 2014-10-30: qty 2.5

## 2014-10-30 MED ORDER — LOSARTAN POTASSIUM 50 MG PO TABS
50.0000 mg | ORAL_TABLET | Freq: Every day | ORAL | Status: DC
  Administered 2014-10-30 – 2014-10-31 (×2): 50 mg via ORAL
  Filled 2014-10-30 (×2): qty 1

## 2014-10-30 NOTE — ED Notes (Signed)
Pt amb to room 1 with quick steady gait smiling in nad. Pt reports sudden onset of nausea and vomiting at 3am, awakened her from sleep. Pt states "this is worse than the nausea I had with chemo..." pt reports multiple episodes of vomiting this am, denies diarrhea or fever, last bm yesterday, normal. Pt states "I feel stopped up today.Marland KitchenMarland Kitchen"

## 2014-10-30 NOTE — Plan of Care (Signed)
Problem: Food- and Nutrition-Related Knowledge Deficit (NB-1.1) Goal: Nutrition education Formal process to instruct or train a patient/client in a skill or to impart knowledge to help patients/clients voluntarily manage or modify food choices and eating behavior to maintain or improve health. Outcome: Adequate for Discharge RD was consulted for Diabetes Type 2 diet education. Dietetic Intern provided "Carbohydrate Counting for People With Diabetes" from the Academy of Nutrition and Dietetics.   Pt is obese female with pre-diabetes. Family at the bedside during consult, all seemed interested in helping Pt out with her needs. Pt has some knowledge about the diabetes and states that she has been trying to eat healthy, but some foods, especially raw vegetables, are hard to digest due to hiatal hernia (per Pt).  Dietetic intern walked Pt through Carbohydrate counting handout and showed MyPlate eating pattern; suggested substitutions based on patient's preferences. Diet recall reveals high intake of sugar sweetened beverages and juice, recommended switching to diet soda and water. Provided tips on eating out and using hand as a portion control guide. Pt appeared receptive and interested in making better food/drink choices. Teach back method was used. Expect good compliance.  Christine [name] A. Tice Intern 10/30/2014 2:54 PM

## 2014-10-30 NOTE — ED Provider Notes (Signed)
CSN: [id number]     Arrival date & time 10/30/14  4709 History   First MD Initiated Contact with Patient 10/30/14 [phone]     Chief Complaint  Patient presents with  . Emesis      HPI Pt reports sudden onset of nausea and vomiting at 3am, awakened her from sleep. Pt states "this is worse than the nausea I had with chemo..." pt reports multiple episodes of vomiting this am, denies diarrhea or fever, last bm yesterday, normal. Pt states "I feel stopped up today..." Patient has no hematemesis or hematochezia.  Patient felt normal when she went to bed.  She did eat a large meal at [place] dinner yesterday. Past Medical History  Diagnosis Date  . ALLERGIC RHINITIS   . GERD (gastroesophageal reflux disease)   . Hypertension   . Hypothyroidism   . Hx of colonic polyps   . Wears glasses   . Breast cancer 28, age 50    right  . Breast cancer 09/30/12    left, ER/PR -, Her 2 -  . Radiation 09/15/13-11/01/13    Left chest wall/PAB/scar/supraclavicular fossa  . SYNCOPE 05/15/2010    Qualifier: Diagnosis of  By: Arnoldo Morale MD, [place], WITH RADICULOPATHY 10/01/2007    Qualifier: Diagnosis of  By: Arnoldo Morale MD, [place] 05/31/2007    Qualifier: Diagnosis of  By: Arnoldo Morale MD, [place], HX OF 05/31/2007    Qualifier: Diagnosis of  By: Arnoldo Morale MD, Balinda Quails   . Lymph edema L arm from breast ca tx 12/22/2013   Past Surgical History  Procedure Laterality Date  . Tonsillectomy    . Caesarean section      x 1  . Mastectomy  04/1987    right side  . Abdominal hysterectomy  06/1988    fibroids  . Portacath placement Right 10/28/2012    Procedure: PORT PLACEMENT;  Surgeon: Joyice Faster. Cornett, MD;  Location: [place];  Service: General;  Laterality: Right;  Right Subclavian Vein  . Mastectomy modified radical Left 07/19/2013    Procedure: MASTECTOMY MODIFIED RADICAL;  Surgeon: Marcello Moores A. Cornett, MD;  Location: [place];  Service:  General;  Laterality: Left;  . Port-a-cath removal Right 07/19/2013    Procedure: REMOVAL PORT-A-CATH;  Surgeon: Joyice Faster. Cornett, MD;  Location: [place];  Service: General;  Laterality: Right;  . Breast surgery Left 07/19/13    MRM   Family History  Problem Relation Age of Onset  . Colon cancer Mother 100    family hx of colon ca 1st degree relative <60  . Coronary artery disease Father     family hx of CAD female 1st degree relative <50  . Stomach cancer Neg Hx   . Rectal cancer Neg Hx   . Esophageal cancer Neg Hx   . Breast cancer Maternal Grandmother 80  . Diabetes Paternal Grandmother   . Breast cancer Cousin 66    maternal cousin  . Coronary artery disease Maternal Grandfather   . Breast cancer Sister     paternal half sister; died in her 43s   History  Substance Use Topics  . Smoking status: Never Smoker   . Smokeless tobacco: Never Used  . Alcohol Use: No   OB History    Obstetric Comments   meanrche age 30, G41 , p 1, menopause [age], no HRT     Review of Systems  All other systems reviewed and are negative  Allergies  Sulfa antibiotics and Sulfonamide derivatives  Home Medications   Prior to Admission medications   Medication Sig Start Date End Date Taking? Authorizing Provider  cholecalciferol (VITAMIN D) 1000 UNITS tablet Take 1,000 Units by mouth daily.    Historical Provider, MD  diltiazem (CARDIZEM CD) 240 MG 24 hr capsule TAKE ONE CAPSULE BY MOUTH EVERY DAY 01/04/14   Ricard Dillon, MD  fluticasone (FLONASE) 50 MCG/ACT nasal spray PLACE 2 SPRAYS INTO THE NOSE DAILY.    Minette Headland, NP  gabapentin (NEURONTIN) 100 MG capsule Take 100 mg by mouth at bedtime.    Historical Provider, MD  KLOR-CON M20 20 MEQ tablet TAKE 1 TABLET (20 MEQ TOTAL) BY MOUTH 2 (TWO) TIMES DAILY. 06/14/14   Nicholas Lose, MD  latanoprost (XALATAN) 0.005 % ophthalmic solution Place 1 drop into both eyes at bedtime.     Historical Provider, MD  levothyroxine  (SYNTHROID, LEVOTHROID) 50 MCG tablet Take 1 tablet (50 mcg total) by mouth daily. 10/06/14   Lucretia Kern, DO  LORazepam (ATIVAN) 0.5 MG tablet Take 1 tablet (0.5 mg total) by mouth every 8 (eight) hours. 10/20/13   Minette Headland, NP  losartan-hydrochlorothiazide (HYZAAR) 50-12.5 MG per tablet TAKE 1 TABLET BY MOUTH DAILY. 08/05/13   Ricard Dillon, MD  losartan-hydrochlorothiazide [facility]) 50-12.5 MG per tablet Take 1 tablet by mouth daily. 09/22/14   Lucretia Kern, DO  NEXIUM 40 MG capsule TAKE 1 CAPSULE (40 MG TOTAL) BY MOUTH DAILY BEFORE BREAKFAST. 04/29/13   Ricard Dillon, MD  NON FORMULARY Fungifoam, Rx by podiatrist and uses as directed    Historical Provider, MD  UNABLE TO FIND [phone] Post-Mastectomy [place] #2 L8000 Post-Surgical Bras #6 [id number] Silicone Breast Prosthesis #2 174.9 12/01/13   Erroll Luna, MD  UNABLE TO FIND [phone] Mastectomy Forms - 6 mths 1 refill 12/06/13   Erroll Luna, MD   BP 138/80 mmHg  Pulse 94  Temp(Src) 98.2 F (36.8 C) (Oral)  Resp 18  SpO2 98% Physical Exam Physical Exam  Nursing note and vitals reviewed. Constitutional: She is oriented to person, place, and time. She appears well-developed and well-nourished. No distress.  HENT:  Head: Normocephalic and atraumatic.  Eyes: Pupils are equal, round, and reactive to light.  Neck: Normal range of motion.  Cardiovascular: Normal rate and intact distal pulses.   Pulmonary/Chest: No respiratory distress.  Abdominal: Normal appearance. She exhibits no distension.  No rebound or guarding tenderness.  Bowel sounds are active.   Musculoskeletal: Normal range of motion.  Neurological: She is alert and oriented to person, place, and time. No cranial nerve deficit.  Skin: Skin is warm and dry. No rash noted.  Psychiatric: She has a normal mood and affect. Her behavior is normal.   ED Course  Procedures (including critical care time) Medications  0.9 %  sodium chloride infusion (not administered)  ondansetron  (ZOFRAN) injection 4 mg (not administered)  ondansetron (ZOFRAN) injection 4 mg (4 mg Intravenous Given 10/30/14 0909)  sodium chloride 0.9 % bolus 1,000 mL (1,000 mLs Intravenous New Bag/Given 10/30/14 0908)    Labs Review Labs Reviewed  COMPREHENSIVE METABOLIC PANEL - Abnormal; Notable for the following:    Potassium 3.3 (*)    Glucose, Bld 253 (*)    Total Bilirubin 0.1 (*)    All other components within normal limits  CBC WITH DIFFERENTIAL/PLATELET  LIPASE, BLOOD  TROPONIN I  URINALYSIS, ROUTINE W REFLEX  MICROSCOPIC    Imaging Review Dg Abd Acute W/chest  10/30/2014   CLINICAL DATA:  History of breast carcinoma with nausea and vomiting  EXAM: ACUTE ABDOMEN SERIES (ABDOMEN 2 VIEW & CHEST 1 VIEW)  COMPARISON:  Chest radiograph October 28, 2012; CT abdomen and pelvis October 27, 2012  FINDINGS: Multiple mass lesions are noted throughout the lungs consistent with metastatic foci. Nodular lesions range in size from as small as 1 cm to as large as 3.7 x 3.3 cm. The largest mass lesion is in the right base region. There is no edema or consolidation. Heart is upper normal in size with pulmonary vascularity within normal limits. No adenopathy is appreciable. There are surgical clips in each axillary region.  Supine and upright abdomen: There is no bowel dilatation or air-fluid level suggesting obstruction. No free air. There are phleboliths in pelvis. There is degenerative change in the lower thoracic and lumbar spine regions.  IMPRESSION: Multiple nodular lesions in the lungs consistent with pulmonary metastases. No edema or consolidation. Bowel gas pattern unremarkable. No obstruction or free air.   Electronically Signed   By: Lowella Grip III M.D.   On: 10/30/2014 09:27     EKG Interpretation   Date/Time:  Monday October 30 2014 08:58:12 EDT Ventricular Rate:  82 PR Interval:  120 QRS Duration: 78 QT Interval:  390 QTC Calculation: 455 R Axis:   8 Text Interpretation:  Normal sinus rhythm  Low voltage QRS Nonspecific T  wave abnormality Abnormal ECG Confirmed by Dorethea Strubel  MD, Shailynn Fong ([id number]) on  10/30/2014 9:01:31 AM     We'll plan on admission to [facility].  Hospitalist notified in confirmed.  Temporary admit orders written. MDM   Final diagnoses:  Nausea and vomiting        Leonard Schwartz, MD 10/30/14 1018

## 2014-10-30 NOTE — Progress Notes (Signed)
UR completed 

## 2014-10-30 NOTE — Progress Notes (Signed)
Inpatient Diabetes Program Recommendations  AACE/ADA: New Consensus Statement on Inpatient Glycemic Control (2013)  Target Ranges:  Prepandial:   less than 140 mg/dL      Peak postprandial:   less than 180 mg/dL (1-2 hours)      Critically ill patients:  140 - 180 mg/dL   Reason for Visit: Diabetes Consult  Diabetes history: "I've been told I'm borderline" Outpatient Diabetes medications: None Current orders for Inpatient glycemic control: Lantus 10 units QHS, Novolog moderate tidwc and hs  59 year old female with a PMH of right breast cancer s/p mastectomy at age 70 with recurrence left breast 10/2012, s/p neoadjuvant chemo followed by left mastectomy with 9/11 lymph nodes + for malignancy, s/p XRT with 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 Glucose on admission - 253. Briefly spoke to pt prior to going to CT. HgbA1C in progress. To start Lantus tonight.  Results for SAMANVI, CUCCIA (MRN [id number]) as of 10/30/2014 15:12  Ref. Range 10/30/2014 09:05  Sodium Latest Range: 136-145 mEq/L 139  Potassium Latest Range: 3.5-5.1 mEq/L 3.3 (L)  Chloride Latest Range: 96-112 mmol/L 100  CO2 Latest Range: 19-32 mmol/L 28  BUN Latest Range: 7.0-26.0 mg/dL 11  Creatinine Latest Range: 0.50-1.10 mg/dL 0.77  Calcium Latest Range: 8.4-10.5 mg/dL 9.1  GFR calc non Af Amer Latest Range: >90 mL/min >90  GFR calc Af Amer Latest Range: >90 mL/min >90  Glucose Latest Range: 70-99 mg/dl 253 (H)  Anion gap Latest Range: 5-15  11   Newly-diagnosed DM.   Agree with orders. Please add CHO mod med to heart healthy diet. Will order Living Well With Diabetes book. Encouraged pt to view diabetes videos on pt ed channel. Will f/u with pt in am to discuss diabetes diagnosis.  Thank you. Lorenda Peck, RD, LDN, CDE Inpatient Diabetes Coordinator [phone]

## 2014-10-30 NOTE — H&P (Signed)
History and Physical:    Christine [name]   WTU:[id number] DOB: 1955/10/09 DOA: 10/30/2014  Referring physician: Dr. Leonard Schwartz PCP: Lucretia Kern., DO   Chief Complaint: Abdominal pain associated with N/V.  History of Present Illness:   Christine [name] is an 59 y.o. 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  No associated diarrhea.  States she had pain in the right chest and epigastric area, crampy in nature, associated with vomiting.  No hematemesis.  The patient denies SOB, has some mild coughing associated with post nasal drainage.  Although she has never been diagnosed with diabetes (but has been told she has prediabetes), her glucose was elevated on presentation. Endorses some polyuria and polydipsia but she attributes this to voluntarily drinking a lot of water and taking a diuretic.  ROS:   Constitutional: No fever, no chills;  Appetite normal; No weight loss, + weight gain, + chronic fatigue.  HEENT: + occasional blurry vision, no diplopia, no pharyngitis, no dysphagia, + PND/sinus issues CV: No typical chest pain, no palpitations, no PND, no orthopnea, + occasional ankle edema.  Resp: No SOB, + occasional cough, no pleuritic pain. GI: + nausea, + vomiting, no diarrhea, no melena, no hematochezia, no constipation, + abdominal pain.  GU: No dysuria, no hematuria, no frequency, no urgency. MSK: no myalgias, + arthralgias.  Neuro:  No headache, no focal neurological deficits, no history of seizures, + chemo related neuropathy.  Psych: No depression, + anxiety.  Endo: No heat intolerance, no cold intolerance, + polyuria, + polydipsia (attributes this to  voluntarily "drinking a lot of water" and HCTZ) Skin: No rashes, no skin lesions, + dry/itchy skin.  Heme: No easy bruising.  Travel history: No recent travel.   Past Medical History:   Past Medical History  Diagnosis Date  . ALLERGIC RHINITIS   . GERD (gastroesophageal reflux disease)   . Hypertension   . Hypothyroidism   . Hx of colonic polyps   . Wears glasses   . Breast cancer 89, age 68    right  . Breast cancer 09/30/12    left, ER/PR -, Her 2 -  . Radiation 09/15/13-11/01/13    Left chest wall/PAB/scar/supraclavicular fossa  . SYNCOPE 05/15/2010    Qualifier: Diagnosis of  By: Arnoldo Morale MD, [place], WITH RADICULOPATHY 10/01/2007    Qualifier: Diagnosis of  By: Arnoldo Morale MD, [place] 05/31/2007    Qualifier: Diagnosis of  By: Arnoldo Morale MD, [place], HX OF 05/31/2007    Qualifier: Diagnosis of  By: Arnoldo Morale MD, Balinda Quails   . Lymph edema L arm from breast ca tx 12/22/2013    Past Surgical History:   Past Surgical History  Procedure Laterality Date  . Tonsillectomy    . Caesarean section      x 1  . Mastectomy  04/1987    right side  . Abdominal hysterectomy  06/1988    fibroids  . Portacath placement Right 10/28/2012    Procedure: PORT PLACEMENT;  Surgeon: Joyice Faster. Cornett, MD;  Location: [place];  Service: General;  Laterality: Right;  Right Subclavian Vein  . Mastectomy modified radical Left 07/19/2013    Procedure: MASTECTOMY MODIFIED RADICAL;  Surgeon: Marcello Moores A. Cornett, MD;  Location: [place];  Service: General;  Laterality: Left;  . Port-a-cath removal Right 07/19/2013    Procedure: REMOVAL PORT-A-CATH;  Surgeon: Joyice Faster. Cornett, MD;  Location: [place];  Service: General;  Laterality: Right;  . Breast surgery Left 07/19/13    MRM    Social History:   History   Social History  . Marital Status: Married    Spouse Name: Donnie  . Number of Children: 1  . Years of  Education: N/A   Occupational History  . Unemployed    Social History Main Topics  . Smoking status: Never Smoker   . Smokeless tobacco: Never Used  . Alcohol Use: No  . Drug Use: No  . Sexual Activity: Not Currently     Comment: menarche 44, 1st pregnancy age 68-miscarr, age 38 1st live birth, menop 89   Other Topics Concern  . Not on file   Social History Narrative   Married. Lives w/ husband.  Has not worked since being diagnosed with cancer 2 years ago.  Previously worked as a Solicitor.  Independent with ADLs.      Spiritual Beliefs:Christian      Lifestyle: started the [employer] exercise program for cancer survivors (12/2013); working on diet             Family history:   Family History  Problem Relation Age of Onset  . Colon cancer Mother 38    family hx of colon ca 1st degree relative <60  . Coronary artery disease Father     family hx of CAD female 1st degree relative <50  . Stomach cancer Neg Hx   . Rectal cancer Neg Hx   . Esophageal cancer Neg Hx   . Breast cancer Maternal Grandmother 80  . Diabetes Paternal Grandmother   . Breast cancer Cousin 89    maternal cousin  . Coronary artery disease Maternal Grandfather   . Breast cancer Sister     paternal half sister; died in her 27s    Allergies   Sulfa antibiotics and Sulfonamide derivatives  Current Medications:   Prior to Admission medications   Medication Sig Start Date End Date Taking? Authorizing Provider  cholecalciferol (VITAMIN D) 1000 UNITS tablet Take 1,000 Units by mouth daily.    Historical Provider, MD  diltiazem (CARDIZEM CD) 240 MG 24 hr capsule TAKE ONE CAPSULE BY MOUTH EVERY DAY 01/04/14   Ricard Dillon, MD  fluticasone (FLONASE) 50 MCG/ACT nasal spray PLACE 2 SPRAYS INTO THE NOSE DAILY.    Minette Headland, NP  gabapentin (NEURONTIN) 100 MG capsule Take 100 mg by mouth at bedtime.    Historical Provider, MD  KLOR-CON M20 20 MEQ tablet TAKE 1 TABLET (20 MEQ TOTAL) BY MOUTH 2  (TWO) TIMES DAILY. 06/14/14   Nicholas Lose, MD  latanoprost (XALATAN) 0.005 % ophthalmic solution Place 1 drop into both eyes at bedtime.     Historical Provider, MD  levothyroxine (SYNTHROID, LEVOTHROID) 50 MCG tablet Take 1 tablet (50 mcg total) by mouth daily. 10/06/14   Lucretia Kern, DO  LORazepam (ATIVAN) 0.5 MG tablet Take 1 tablet (0.5 mg total) by mouth every 8 (eight) hours. 10/20/13   Minette Headland, NP  losartan-hydrochlorothiazide (HYZAAR) 50-12.5 MG per tablet TAKE 1 TABLET BY MOUTH DAILY. 08/05/13   Ricard Dillon, MD  losartan-hydrochlorothiazide (HYZAAR) 50-12.5 MG per tablet Take 1 tablet by mouth daily. 09/22/14   Lucretia Kern, DO  NEXIUM 40 MG capsule TAKE 1 CAPSULE (40 MG TOTAL) BY MOUTH DAILY BEFORE BREAKFAST. 04/29/13   Ricard Dillon, MD  NON FORMULARY Fungifoam, Rx by podiatrist and uses as directed    Historical Provider, MD  UNABLE TO FIND [place] #2 L8000 Post-Surgical Bras #6 [id number] Silicone Breast Prosthesis #2 174.9 12/01/13   Erroll Luna, MD  UNABLE TO FIND [phone] Mastectomy Forms - 6 mths 1 refill 12/06/13   Erroll Luna, MD    Physical Exam:   Filed Vitals:   10/30/14 0850 10/30/14 1044 10/30/14 1145  BP: 138/80 129/79 148/75  Pulse: 94 90 89  Temp: 98.2 F (36.8 C) 98.4 F (36.9 C) 98.1 F (36.7 C)  TempSrc: Oral Oral Oral  Resp: 18 18 18   Height:   5\' 4"  (1.626 m)  Weight:   102.377 kg (225 lb 11.2 oz)  SpO2: 98% 97% 93%     Physical Exam: Blood pressure 148/75, pulse 89, temperature 98.1 F (36.7 C), temperature source Oral, resp. rate 18, height 5\' 4"  (1.626 m), weight 102.377 kg (225 lb 11.2 oz), SpO2 93 %. Gen: No acute distress. Morbidly obese. Head: Normocephalic, atraumatic. Eyes: PERRL, EOMI, sclerae nonicteric. Mouth: Oropharynx is clear with dry mucous membranes. Neck: Supple, no thyromegaly, no lymphadenopathy, no jugular venous distention. Chest: Lungs are clear to auscultation bilaterally with good air  movement. CV: Heart sounds are regular with a soft grade 2 systolic murmur at the left upper sternal border. Abdomen: Soft, nontender, nondistended with normal active bowel sounds. Extremities: Extremities are with trace edema bilaterally. Skin: Warm and dry. Neuro: Alert and oriented times 3; cranial nerves II through XII grossly intact. Psych: Mood and affect normal.   Data Review:    Labs: Basic Metabolic Panel:  Recent Labs Lab 10/30/14 0905  NA 139  K 3.3*  CL 100  CO2 28  GLUCOSE 253*  BUN 11  CREATININE 0.77  CALCIUM 9.1   Liver Function Tests:  Recent Labs Lab 10/30/14 0905  AST 31  ALT 21  ALKPHOS 95  BILITOT 0.1*  PROT 7.1  ALBUMIN 4.2    Recent Labs Lab 10/30/14 0905  LIPASE 26   CBC:  Recent Labs Lab 10/30/14 0905  WBC 5.9  NEUTROABS 4.2  HGB 12.4  HCT 36.7  MCV 94.1  PLT 228   Cardiac Enzymes:  Recent Labs Lab 10/30/14 0905  TROPONINI <0.03    Radiographic Studies: Dg Abd Acute W/chest  10/30/2014   CLINICAL DATA:  History of breast carcinoma with nausea and vomiting  EXAM: ACUTE ABDOMEN SERIES (ABDOMEN 2 VIEW & CHEST 1 VIEW)  COMPARISON:  Chest radiograph October 28, 2012; CT abdomen and pelvis October 27, 2012  FINDINGS: Multiple mass lesions are noted throughout the lungs consistent with metastatic foci. Nodular lesions range in size from as small as 1 cm to as large as 3.7 x 3.3 cm. The largest mass lesion is in the right base region. There is no edema or consolidation. Heart is upper normal in size with pulmonary vascularity within normal limits. No adenopathy is appreciable. There are surgical clips in each axillary region.  Supine and upright abdomen: There is no bowel dilatation or air-fluid level suggesting obstruction. No free air. There are phleboliths in pelvis. There is degenerative change in the lower thoracic and lumbar spine regions.  IMPRESSION: Multiple nodular lesions in the lungs  consistent with pulmonary metastases. No  edema or consolidation. Bowel gas pattern unremarkable. No obstruction or free air.   Electronically Signed   By: Lowella Grip III M.D.   On: 10/30/2014 09:27   *I have personally reviewed the images above*  EKG: Independently reviewed. NSR at 82 bpm.  Diffuse T wave inversions/flattening.   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 - Chest x-ray findings discussed with the patient and with Dr. Lindi Adie (patient's oncologist) who will see the patient in consultation.  Active Problems:   Hypothyroidism - Continue home dose of Synthroid. Check TSH.    ANXIETY DEPRESSION - Has taken Ativan in the past for anxiety. Will reorder given anxiety regarding new findings.    Essential hypertension - Continue Hyzaar/HCTZ and Cardizem.    GERD - Continue PPI therapy.    Impaired fasting glucose - Likely new onset diabetes. Check hemoglobin A1c. - Patient counseled. Dietitian/diabetes coordinator consultation was requested. - Started on moderate scale SSI with 10 units of Lantus daily at bedtime.    Nausea and vomiting - CT abdomen/pelvis R/O intraabdominal metastasis. - Anti-emetics PRN.  IVF x 12 hours.    Hypokalemia - Likely from diuretic therapy in the setting of nausea/vomiting. Has not been taking her by mouth supplement. - Resume supplementation.    EKG abnormality - Cycle markers every 6 hours 3. - Doubt cardiac given atypical symptoms and findings on chest radiography. - If markers positive, add aspirin. Would hold off on aspirin now secondary to nausea/vomiting. - Monitor on telemetry 24 hours. If markers negative, can D/C telemetry tomorrow.     DVT prophylaxis - Lovenox ordered.  Code Status: Full. Family Communication: Shi Grose (husband) updated at the bedside: [phone]. Daughter Rheda Kassab [phone]) not present but  patient wants listed as an emergency contact in addition to her husband.  Disposition Plan: Home when stable.  Time spent: 70 minutes.  RAMA,CHRISTINA [facility] Pager [phone] Cell: [phone]   If 7PM-7AM, please contact night-coverage [email or web address] Password [facility] 10/30/2014, 12:50 PM

## 2014-10-30 NOTE — Progress Notes (Addendum)
Accepted for transfer to a tele bed, from Dr. 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   Initial EKG showed non-specific T wave abnormalities and initial troponin was negative.  Unfortunately, a CXR showed multiple nodular lesions in the lungs consistent with pulmonary metastasis.  No obstruction or free air was noted.  Labs were significant for a glucose of 253 (no history of DM---but has been told she had "pre-diabetes") and an anion gap of 11.    Kioni Stahl 10/30/2014 10:15 AM

## 2014-10-30 NOTE — Consult Note (Signed)
[facility] CONSULT NOTE  Patient Care Team: Lucretia Kern, DO as PCP - General (Family Medicine)  CHIEF COMPLAINTS/PURPOSE OF CONSULTATION:  Bilateral lung nodules with a history of breast cancer  HISTORY OF PRESENTING ILLNESS:  Christine [name] 59 y.o. female is a 59 year old lady with a history of right breast cancer diagnosed when she was 59 years old and had a mastectomy this happened while she was pregnant. She then had relapsed left breast cancer triple negative disease in February 2014. She was treated with neoadjuvant chemotherapy for a 13 cm tumor with involvement lymph nodes with FEC followed by Taxol carbo 8 followed by Gemzar carboplatin 2 and then underwent left mastectomy 07/19/2013. 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 Currently on observation  Patient had Easter and thought she ate too much food when she came in with intractable nausea and vomiting. Incidentally a chest x-ray was obtained which showed lung nodules. She then underwent a CT chest abdomen pelvis which revealed multiple bilateral lung nodules with hilar and mediastinal lymphadenopathy along with a very small lesion in the liver which measure 8 mm in size of uncertain significance. There was no evidence of any bone metastases. We are consulted to discuss management of her suspicious metastatic breast cancer.  I reviewed her records extensively and collaborated the history with the patient.  MEDICAL HISTORY:  Past Medical History  Diagnosis Date  . ALLERGIC RHINITIS   . GERD (gastroesophageal reflux disease)   . Hypertension   . Hypothyroidism   . Hx of colonic polyps   . Wears glasses   . Breast cancer 68, age 60    right  . Breast cancer 09/30/12    left, ER/PR -, Her 2 -  . Radiation 09/15/13-11/01/13    Left chest  wall/PAB/scar/supraclavicular fossa  . SYNCOPE 05/15/2010    Qualifier: Diagnosis of  By: Arnoldo Morale MD, [place], WITH RADICULOPATHY 10/01/2007    Qualifier: Diagnosis of  By: Arnoldo Morale MD, [place] 05/31/2007    Qualifier: Diagnosis of  By: Arnoldo Morale MD, [place], HX OF 05/31/2007    Qualifier: Diagnosis of  By: Arnoldo Morale MD, Balinda Qu[place]   . Lymph edema L arm from breast ca tx 12/22/2013    SURGICAL HISTORY: Past Surgical History  Procedure Laterality Date  . Tonsillectomy    . Caesarean section      x 1  . Mastectomy  04/1987    right side  . Abdominal hysterectomy  06/1988    fibroids  . Portacath placement Right 10/28/2012    Procedure: PORT PLACEMENT;  Surgeon: Joyice Faster. Cornett, MD;  Location: [facility];  Service: General;  Laterality: Right;  Right Subclavian Vein  . Mastectomy modified radical Left 07/19/2013    Procedure: MASTECTOMY MODIFIED RADICAL;  Surgeon: Marcello Moores A. Cornett, MD;  Location: [facility];  Service: General;  Laterality: Left;  . Port-a-cath removal Right 07/19/2013    Procedure: REMOVAL PORT-A-CATH;  Surgeon: Joyice Faster. Cornett, MD;  Location: [facility];  Service: General;  Laterality: Right;  . Breast surgery Left 07/19/13    MRM    SOCIAL HISTORY: History   Social History  . Marital Status: Married    Spouse Name: Donnie  . Number of Children: 1  . Years of Education:  N/A   Occupational History  . Unemployed    Social History Main Topics  . Smoking status: Never Smoker   . Smokeless tobacco: Never Used  . Alcohol Use: No  . Drug Use: No  . Sexual Activity: Not Currently     Comment: menarche 5, 1st pregnancy age 58-miscarr, age 55 1st live birth, menop 13   Other Topics Concern  . Not on file   Social History Narrative   Married. Lives w/ husband.  Has not worked since being diagnosed with cancer 2 years ago.  Previously worked as a Solicitor.   Independent with ADLs.      Spiritual Beliefs:Christian      Lifestyle: started the [employer] exercise program for cancer survivors (12/2013); working on diet             FAMILY HISTORY: Family History  Problem Relation Age of Onset  . Colon cancer Mother 73    family hx of colon ca 1st degree relative <60  . Coronary artery disease Father     family hx of CAD female 1st degree relative <50  . Stomach cancer Neg Hx   . Rectal cancer Neg Hx   . Esophageal cancer Neg Hx   . Breast cancer Maternal Grandmother 80  . Diabetes Paternal Grandmother   . Breast cancer Cousin 49    maternal cousin  . Coronary artery disease Maternal Grandfather   . Breast cancer Sister     paternal half sister; died in her 42s    ALLERGIES:  is allergic to sulfa antibiotics and sulfonamide derivatives.  MEDICATIONS:  Current Facility-Administered Medications  Medication Dose Route Frequency Provider Last Rate Last Dose  . 0.9 %  sodium chloride infusion  250 mL Intravenous PRN Christina P Rama, MD      . 0.9 % NaCl with KCl 20 mEq/ L  infusion   Intravenous Continuous Venetia Maxon Rama, MD 75 mL/hr at 10/30/14 1355    . acetaminophen (TYLENOL) tablet 650 mg  650 mg Oral Q6H PRN Venetia Maxon Rama, MD       Or  . acetaminophen (TYLENOL) suppository 650 mg  650 mg Rectal Q6H PRN Christina P Rama, MD      . alum & mag hydroxide-simeth (MAALOX/MYLANTA) 200-200-20 MG/5ML suspension 30 mL  30 mL Oral Q6H PRN Venetia Maxon Rama, MD      . cholecalciferol (VITAMIN D) tablet 1,000 Units  1,000 Units Oral Daily Venetia Maxon Rama, MD   1,000 Units at 10/30/14 1542  . diltiazem (CARDIZEM CD) 24 hr capsule 240 mg  240 mg Oral Daily Venetia Maxon Rama, MD   240 mg at 10/30/14 1542  . enoxaparin (LOVENOX) injection 50 mg  50 mg Subcutaneous Q24H Christina P Rama, MD      . losartan (COZAAR) tablet 50 mg  50 mg Oral Daily Venetia Maxon Rama, MD   50 mg at 10/30/14 1543   And  . hydrochlorothiazide (MICROZIDE) capsule 12.5 mg  12.5  mg Oral Daily Venetia Maxon Rama, MD   12.5 mg at 10/30/14 1543  . insulin aspart (novoLOG) injection 0-15 Units  0-15 Units Subcutaneous TID WC Christina P Rama, MD      . insulin aspart (novoLOG) injection 0-5 Units  0-5 Units Subcutaneous QHS Christina P Rama, MD      . insulin glargine (LANTUS) injection 10 Units  10 Units Subcutaneous QHS Christina P Rama, MD      . latanoprost (XALATAN) 0.005 % ophthalmic  solution 1 drop  1 drop Both Eyes QHS Venetia Maxon Rama, MD      . Derrill Memo ON 10/31/2014] levothyroxine (SYNTHROID, LEVOTHROID) tablet 50 mcg  50 mcg Oral QAC breakfast Christina P Rama, MD      . LORazepam (ATIVAN) tablet 0.5 mg  0.5 mg Oral 3 times per day Venetia Maxon Rama, MD   0.5 mg at 10/30/14 1355  . morphine 2 MG/ML injection 2 mg  2 mg Intravenous Q3H PRN Christina P Rama, MD      . ondansetron (ZOFRAN) tablet 4 mg  4 mg Oral Q6H PRN Christina P Rama, MD       Or  . ondansetron (ZOFRAN) injection 4 mg  4 mg Intravenous Q6H PRN Venetia Maxon Rama, MD   4 mg at 10/30/14 1629  . oxyCODONE (Oxy IR/ROXICODONE) immediate release tablet 5 mg  5 mg Oral Q4H PRN Venetia Maxon Rama, MD      . Derrill Memo ON 10/31/2014] pantoprazole (PROTONIX) EC tablet 40 mg  40 mg Oral Daily Christina P Rama, MD      . polyethylene glycol (MIRALAX / GLYCOLAX) packet 17 g  17 g Oral Daily PRN Christina P Rama, MD      . sodium chloride 0.9 % injection 3 mL  3 mL Intravenous Q12H Christina P Rama, MD      . sodium chloride 0.9 % injection 3 mL  3 mL Intravenous Q12H Venetia Maxon Rama, MD   3 mL at 10/30/14 1407  . sodium chloride 0.9 % injection 3 mL  3 mL Intravenous PRN Venetia Maxon Rama, MD        REVIEW OF SYSTEMS:   Constitutional: Denies fevers, chills or abnormal night sweats Eyes: Denies blurriness of vision, double vision or watery eyes Ears, nose, mouth, throat, and face: Denies mucositis or sore throat Respiratory: Denies cough, dyspnea or wheezes Cardiovascular: Denies palpitation, chest discomfort or lower  extremity swelling Gastrointestinal:  Mild nausea Skin: Denies abnormal skin rashes Lymphatics: Denies new lymphadenopathy or easy bruising Neurological:Denies numbness, tingling or new weaknesses Behavioral/Psych: Mood is stable, no new changes  Breast: Left chest wall tightness and swelling  All other systems were reviewed with the patient and are negative.  PHYSICAL EXAMINATION: ECOG PERFORMANCE STATUS: 1 - Symptomatic but completely ambulatory  Filed Vitals:   10/30/14 1145  BP: 148/75  Pulse: 89  Temp: 98.1 F (36.7 C)  Resp: 18   Filed Weights   10/30/14 1145  Weight: 225 lb 11.2 oz (102.377 kg)    GENERAL:alert, no distress and comfortable SKIN: skin color, texture, turgor are normal, no rashes or significant lesions EYES: normal, conjunctiva are pink and non-injected, sclera clear OROPHARYNX:no exudate, no erythema and lips, buccal mucosa, and tongue normal  NECK: supple, thyroid normal size, non-tender, without nodularity LYMPH:  no palpable lymphadenopathy in the cervical, axillary or inguinal LUNGS: clear to auscultation and percussion with normal breathing effort HEART: regular rate & rhythm and no murmurs and no lower extremity edema ABDOMEN:abdomen soft, non-tender and normal bowel sounds Musculoskeletal:no cyanosis of digits and no clubbing  PSYCH: alert & oriented x 3 with fluent speech NEURO: no focal motor/sensory deficits  LABORATORY DATA:  I have reviewed the data as listed Lab Results  Component Value Date   WBC 7.9 10/30/2014   HGB 12.5 10/30/2014   HCT 37.6 10/30/2014   MCV 93.5 10/30/2014   PLT 225 10/30/2014   Lab Results  Component Value Date   NA 139 10/30/2014  K 3.3* 10/30/2014   CL 100 10/30/2014   CO2 28 10/30/2014    RADIOGRAPHIC STUDIES: I have personally reviewed the radiological reports and agreed with the findings in the report. CT chest abdomen and pelvis was reviewed with the patient  ASSESSMENT AND PLAN:  1.  Bilateral lung nodules with hilar and mediastinal adenopathy and a history of very aggressive triple negative stage IIIc breast cancer. This is highly suspicious for metastatic breast cancer. I recommended obtaining a biopsy of one of these lung nodules. I called and left a message for interventional radiology to review her case and to obtain a tissue biopsy. The specimen she needs to be sent for breast prognostic panel. Previously she was triple negative disease. It is unclear what her original breast pathology was when she was 59 years old on the right breast.  2. Patient is clearly saddened to hear these results she is completely asymptomatic from her lung standpoint. I will discuss with her treatment options once we have more definitive answers from the tissue diagnosis.  Thank you very much for the consultation.  All questions were answered. The patient knows to call the clinic with any problems, questions or concerns.    Rulon Eisenmenger, MD 4:36 PM

## 2014-10-30 NOTE — ED Notes (Signed)
[facility] has been notified of room 1413, they are transporting patient to [facility].

## 2014-10-31 ENCOUNTER — Encounter (HOSPITAL_COMMUNITY): Payer: Self-pay | Admitting: Radiology

## 2014-10-31 ENCOUNTER — Ambulatory Visit (HOSPITAL_COMMUNITY): Admission: RE | Admit: 2014-10-31 | Payer: BLUE CROSS/BLUE SHIELD | Source: Ambulatory Visit

## 2014-10-31 ENCOUNTER — Inpatient Hospital Stay (HOSPITAL_COMMUNITY): Payer: BLUE CROSS/BLUE SHIELD

## 2014-10-31 DIAGNOSIS — C7800 Secondary malignant neoplasm of unspecified lung: Principal | ICD-10-CM

## 2014-10-31 DIAGNOSIS — E038 Other specified hypothyroidism: Secondary | ICD-10-CM

## 2014-10-31 LAB — GLUCOSE, CAPILLARY
Glucose-Capillary: 139 mg/dL — ABNORMAL HIGH (ref 70–99)
Glucose-Capillary: 142 mg/dL — ABNORMAL HIGH (ref 70–99)

## 2014-10-31 LAB — HEMOGLOBIN A1C
Hgb A1c MFr Bld: 8.7 % — ABNORMAL HIGH (ref 4.8–5.6)
Mean Plasma Glucose: 203 mg/dL

## 2014-10-31 LAB — TROPONIN I

## 2014-10-31 LAB — PROTIME-INR
INR: 1.01 (ref 0.00–1.49)
Prothrombin Time: 13.4 seconds (ref 11.6–15.2)

## 2014-10-31 MED ORDER — FENTANYL CITRATE 0.05 MG/ML IJ SOLN
INTRAMUSCULAR | Status: AC | PRN
  Administered 2014-10-31: 25 ug via INTRAVENOUS

## 2014-10-31 MED ORDER — MIDAZOLAM HCL 2 MG/2ML IJ SOLN
INTRAMUSCULAR | Status: AC | PRN
  Administered 2014-10-31: 0.5 mg via INTRAVENOUS

## 2014-10-31 MED ORDER — ALUM & MAG HYDROXIDE-SIMETH 200-200-20 MG/5ML PO SUSP: 30.0000 mL | Freq: Four times a day (QID) | ORAL | Status: DC | PRN

## 2014-10-31 MED ORDER — ONDANSETRON HCL 4 MG PO TABS: 4.0000 mg | ORAL_TABLET | Freq: Four times a day (QID) | ORAL | Status: DC | PRN

## 2014-10-31 MED ORDER — MIDAZOLAM HCL 2 MG/2ML IJ SOLN
INTRAMUSCULAR | Status: AC
  Filled 2014-10-31: qty 6

## 2014-10-31 MED ORDER — OXYCODONE HCL 5 MG PO TABS: 5.0000 mg | ORAL_TABLET | ORAL | Status: DC | PRN

## 2014-10-31 MED ORDER — FENTANYL CITRATE 0.05 MG/ML IJ SOLN
INTRAMUSCULAR | Status: AC
  Filled 2014-10-31: qty 4

## 2014-10-31 NOTE — Discharge Instructions (Signed)
Pulmonary Nodule A pulmonary nodule is a small, round growth of tissue in the lung. Pulmonary nodules can range in size from less than 1/5 inch (4 mm) to a little bigger than an inch (25 mm). Most pulmonary nodules are detected when imaging tests of the lung are being performed for a different problem. Pulmonary nodules are usually not cancerous (benign). However, some pulmonary nodules are cancerous (malignant). Follow-up treatment or testing is based on the size of the pulmonary nodule and your risk of getting lung cancer.  CAUSES Benign pulmonary nodules can be caused by various things. Some of the causes include:   Bacterial, fungal, or viral infections. This is usually an old infection that is no longer active, but it can sometimes be a current, active infection.  A benign mass of tissue.  Inflammation from conditions such as rheumatoid arthritis.   Abnormal blood vessels in the lungs. Malignant pulmonary nodules can result from lung cancer or from cancers that spread to the lung from other places in the body. SIGNS AND SYMPTOMS Pulmonary nodules usually do not cause symptoms. DIAGNOSIS Most often, pulmonary nodules are found incidentally when an X-ray or CT scan is performed to look for some other problem in the lung area. To help determine whether a pulmonary nodule is benign or malignant, your health care provider will take a medical history and order a variety of tests. Tests done may include:   Blood tests.  A skin test called a tuberculin test. This test is used to determine if you have been exposed to the germ that causes tuberculosis.   Chest X-rays. If possible, a new X-ray may be compared with X-rays you have had in the past.   CT scan. This test shows smaller pulmonary nodules more clearly than an X-ray.   Positron emission tomography (PET) scan. In this test, a safe amount of a radioactive substance is injected into the bloodstream. Then, the scan takes a picture of  the pulmonary nodule. The radioactive substance is eliminated from your body in your urine.   Biopsy. A tiny piece of the pulmonary nodule is removed so it can be checked under a microscope. TREATMENT  Pulmonary nodules that are benign normally do not require any treatment because they usually do not cause symptoms or breathing problems. Your health care provider may want to monitor the pulmonary nodule through follow-up CT scans. The frequency of these CT scans will vary based on the size of the nodule and the risk factors for lung cancer. For example, CT scans will need to be done more frequently if the pulmonary nodule is larger and if you have a history of smoking and a family history of cancer. Further testing or biopsies may be done if any follow-up CT scan shows that the size of the pulmonary nodule has increased. HOME CARE INSTRUCTIONS  Only take over-the-counter or prescription medicines as directed by your health care provider.  Keep all follow-up appointments with your health care provider. SEEK MEDICAL CARE IF:  You have trouble breathing when you are active.   You feel sick or unusually tired.   You do not feel like eating.   You lose weight without trying to.   You develop chills or night sweats.  SEEK IMMEDIATE MEDICAL CARE IF:  You cannot catch your breath, or you begin wheezing.   You cannot stop coughing.   You cough up blood.   You become dizzy or feel like you are going to pass out.   You   have sudden chest pain.   You have a fever or persistent symptoms for more than 2-3 days.   You have a fever and your symptoms suddenly get worse. MAKE SURE YOU:  Understand these instructions.  Will watch your condition.  Will get help right away if you are not doing well or get worse. Document Released: 05/18/2009 Document Revised: 03/23/2013 Document Reviewed: 01/10/2013 ExitCare Patient Information 2015 [employer]. This information is not intended  to replace advice given to you by your health care provider. Make sure you discuss any questions you have with your health care provider.  

## 2014-10-31 NOTE — Discharge Summary (Signed)
Physician Discharge Summary  Christine [name]:[id number] DOB: [birth year] DOA: 10/30/2014  PCP: Lucretia Kern., DO  Admit date: 10/30/2014 Discharge date: 10/31/2014  Recommendations for Outpatient Follow-up:  1. Follow up PCP in 1 week after discharge 2. Also will have oncology follow up with her in regards to biopsy results   Discharge Diagnoses:  Principal Problem:   Lung metastasis Active Problems:   Hypothyroidism   ANXIETY DEPRESSION   Essential hypertension   GERD   Impaired fasting glucose   Nausea and vomiting   Bilateral breast cancer   Hypokalemia   EKG abnormality    Discharge Condition: stable   Diet recommendation: as tolerated   History of present illness:  59 y.o. female with a PMH of hiatal hernia and right breast cancer s/p mastectomy at age 60 with recurrence left breast 10/2012, s/p neoadjuvant chemo followed by left mastectomy with 9/11 lymph nodes + for malignancy, s/p XRT with concurrent Xeloda through 10/15/14 for triple negative disease who presented to [facility] with new onset nausea and vomiting and chest discomfort. She was found to have pulmonary mets on this admission and biopsy performed prior to discharge, results to be followed by oncology.   Hospital Course:  Principal Problem:  Lung metastasis in a patient with a PMH of bilateral breast cancer - CT scan points towards metastatic cancer - Biopsy of lung mass done prior to discharge - Oncology to follow up bx results   Active Problems:  Hypothyroidism - Continue home dose of Synthroid.    ANXIETY DEPRESSION - Has taken Ativan in the past for anxiety. Now stable   Essential hypertension - Continue Hyzaar/HCTZ and Cardizem.   GERD - Continue PPI therapy.   Impaired fasting glucose / new onset diabetes  - A1c 8.7 indicating diabetes - Pt preferred to have outpt follow up with PCP instead of starting new medication in hospital, i respected her wish so she assured me she will follow  up with PCP to start diabetic med.   Nausea and vomiting - CT abdomen/pelvis done, showing pulmonary mets, no acute intra-abdominal findings.    Hypokalemia - Likely from diuretic therapy in the setting of nausea/vomiting. She has not been taking her by mouth supplement. - Resumed supplementation.   EKG abnormality - Cardiac markers WNL - No chest pain at this time    DVT prophylaxis - Lovenox ordered while pt in hospital    Signed:  Leisa Lenz, MD  [facility] 10/31/2014, 10:08 AM  Pager #: [phone]   Discharge Exam: Filed Vitals:   10/31/14 0616  BP: 127/51  Pulse: 88  Temp: 97.9 F (36.6 C)  Resp: 18   Filed Vitals:   10/30/14 1044 10/30/14 1145 10/30/14 2146 10/31/14 0616  BP: 129/79 148/75 119/59 127/51  Pulse: 90 89 94 88  Temp: 98.4 F (36.9 C) 98.1 F (36.7 C) 98.2 F (36.8 C) 97.9 F (36.6 C)  TempSrc: Oral Oral Oral Oral  Resp: 18 18 18 18   Height:  5\' 4"  (1.626 m)    Weight:  102.377 kg (225 lb 11.2 oz)    SpO2: 97% 93% 99% 92%    General: Pt is alert, follows commands appropriately, not in acute distress Cardiovascular: Regular rate and rhythm, S1/S2 +, no murmurs Respiratory: Clear to auscultation bilaterally, no wheezing, no crackles, no rhonchi Abdominal: Soft, non tender, non distended, bowel sounds +, no guarding Extremities: no edema, no cyanosis, pulses palpable bilaterally DP and PT Neuro: Grossly nonfocal  Discharge Instructions  Discharge Instructions    Call MD for:  difficulty breathing, headache or visual disturbances    Complete by:  As directed      Call MD for:  persistant nausea and vomiting    Complete by:  As directed      Call MD for:  severe uncontrolled pain    Complete by:  As directed      Diet - low sodium heart healthy    Complete by:  As directed      Increase activity slowly    Complete by:  As directed             Medication List    STOP taking these medications        naproxen  sodium 220 MG tablet  Commonly known as:  ANAPROX      TAKE these medications        alum & mag hydroxide-simeth 200-200-20 MG/5ML suspension  Commonly known as:  MAALOX/MYLANTA  Take 30 mLs by mouth every 6 (six) hours as needed for indigestion or heartburn (dyspepsia).     cholecalciferol 1000 UNITS tablet  Commonly known as:  VITAMIN D  Take 1,000 Units by mouth daily.     diltiazem 240 MG 24 hr capsule  Commonly known as:  CARDIZEM CD  Take 240 mg by mouth daily.     esomeprazole 20 MG capsule  Commonly known as:  NEXIUM  Take 20 mg by mouth daily at 12 noon.     fluticasone 50 MCG/ACT nasal spray  Commonly known as:  FLONASE  Place 2 sprays into both nostrils daily as needed for allergies or rhinitis.     latanoprost 0.005 % ophthalmic solution  Commonly known as:  XALATAN  Place 1 drop into both eyes at bedtime.     levothyroxine 50 MCG tablet  Commonly known as:  SYNTHROID, LEVOTHROID  Take 1 tablet (50 mcg total) by mouth daily.     LORazepam 0.5 MG tablet  Commonly known as:  ATIVAN  Take 1 tablet (0.5 mg total) by mouth every 8 (eight) hours.     losartan-hydrochlorothiazide 50-12.5 MG per tablet  Commonly known as:  HYZAAR  Take 1 tablet by mouth daily.     NON FORMULARY  Apply 1 application topically daily as needed (Applies to feet for athlete's foot.). Fungifoam     ondansetron 4 MG tablet  Commonly known as:  ZOFRAN  Take 1 tablet (4 mg total) by mouth every 6 (six) hours as needed for nausea.     oxyCODONE 5 MG immediate release tablet  Commonly known as:  Oxy IR/ROXICODONE  Take 1 tablet (5 mg total) by mouth every 4 (four) hours as needed for moderate pain.           Follow-up Information    Follow up with Colin Benton R., DO. Schedule an appointment as soon as possible for a visit in 1 week.   Specialty:  Family Medicine   Why:  Follow up appt after recent hospitalization   Contact information:   [place] [facility]  [address] [phone]       Follow up with Rulon Eisenmenger, MD On 11/28/2014.   Specialty:  Hematology and Oncology   Why:  at 1:15 pm   Contact information:   [place] [phone]        The results of significant diagnostics from this hospitalization (including imaging, microbiology, ancillary and laboratory) are listed  below for reference.    Significant Diagnostic Studies: Ct Chest W Contrast 10/30/2014  1. Interval development of multiple bilateral pulmonary nodules compatible with metastatic disease. New moderate right pleural effusion potentially secondary to malignant effusion. 2. Interval development of mediastinal and right hilar adenopathy concerning for metastatic disease. 3. Small low-attenuation lesion within the liver, metastatic disease not excluded. 4. Postoperative changes compatible with interval left mastectomy. There is overlying skin thickening and nodularity of the mastectomy site. Additionally there is irregularity and thickening of the adjacent pectoralis musculature. Localized recurrence is not excluded. 5. Large 15 cm fluid collection at the postoperative site within the anterior left chest wall which may represent seroma.   Electronically Signed   By: Lovey Newcomer M.D.   On: 10/30/2014 16:03   Ct Abdomen Pelvis W Contrast 10/30/2014   1. Interval development of multiple bilateral pulmonary nodules compatible with metastatic disease. New moderate right pleural effusion potentially secondary to malignant effusion. 2. Interval development of mediastinal and right hilar adenopathy concerning for metastatic disease. 3. Small low-attenuation lesion within the liver, metastatic disease not excluded. 4. Postoperative changes compatible with interval left mastectomy. There is overlying skin thickening and nodularity of the mastectomy site. Additionally there is irregularity and thickening of the adjacent pectoralis musculature. Localized recurrence is  not excluded. 5. Large 15 cm fluid collection at the postoperative site within the anterior left chest wall which may represent seroma.     Dg Abd Acute W/chest 10/30/2014 Multiple nodular lesions in the lungs consistent with pulmonary metastases. No edema or consolidation. Bowel gas pattern unremarkable. No obstruction or free air.     Microbiology: No results found for this or any previous visit (from the past 240 hour(s)).   Labs: Basic Metabolic Panel:  Recent Labs Lab 10/30/14 0905 10/30/14 1300  NA 139  --   K 3.3*  --   CL 100  --   CO2 28  --   GLUCOSE 253*  --   BUN 11  --   CREATININE 0.77 0.74  CALCIUM 9.1  --    Liver Function Tests:  Recent Labs Lab 10/30/14 0905  AST 31  ALT 21  ALKPHOS 95  BILITOT 0.1*  PROT 7.1  ALBUMIN 4.2    Recent Labs Lab 10/30/14 0905  LIPASE 26   No results for input(s): AMMONIA in the last 168 hours. CBC:  Recent Labs Lab 10/30/14 0905 10/30/14 1300  WBC 5.9 7.9  NEUTROABS 4.2  --   HGB 12.4 12.5  HCT 36.7 37.6  MCV 94.1 93.5  PLT 228 225   Cardiac Enzymes:  Recent Labs Lab 10/30/14 0905 10/30/14 1300 10/30/14 1735 10/31/14 0017  TROPONINI <0.03 0.03 0.03 <0.03   BNP: BNP (last 3 results)  Recent Labs  10/30/14 1300  BNP 55.3    ProBNP (last 3 results) No results for input(s): PROBNP in the last 8760 hours.  CBG:  Recent Labs Lab 10/30/14 1740 10/30/14 2141 10/31/14 0738  GLUCAP 150* 139* 142*    Time coordinating discharge: Over 30 minutes

## 2014-10-31 NOTE — Procedures (Signed)
Interventional Radiology Procedure Note  Procedure: CT guided biopsy of RLL pulmonary nodule Complications: No immediate Recommendations: - Bedrest until CXR cleared.  Minimize talking, coughing or otherwise straining.  - Follow up 2 hr CXR pending   Signed,  Kris No K. Atonya Templer, MD   

## 2014-10-31 NOTE — Progress Notes (Signed)
Referring Physician(s): Dr. Lindi Adie  Subjective: 59 yo AA female with [prior hx of breast cancer. She presented with N/V and abd pain after Easter but CXR finds right pleural effusion with multiple pulmonary masses concerning for metastatic process. IR is requested to perform tissue sampling. PMHx, chart, meds, imaging reviewed.  Past Medical History  Diagnosis Date  . ALLERGIC RHINITIS   . GERD (gastroesophageal reflux disease)   . Hypertension   . Hypothyroidism   . Hx of colonic polyps   . Wears glasses   . Breast cancer 15, age 89    right  . Breast cancer 09/30/12    left, ER/PR -, Her 2 -  . Radiation 09/15/13-11/01/13    Left chest wall/PAB/scar/supraclavicular fossa  . SYNCOPE 05/15/2010    Qualifier: Diagnosis of  By: Arnoldo Morale MD, [place], WITH RADICULOPATHY 10/01/2007    Qualifier: Diagnosis of  By: Arnoldo Morale MD, [place] 05/31/2007    Qualifier: Diagnosis of  By: Arnoldo Morale MD, [place], HX OF 05/31/2007    Qualifier: Diagnosis of  By: Arnoldo Morale MD, Balinda Quails   . Lymph edema L arm from breast ca tx 12/22/2013   Past Surgical History  Procedure Laterality Date  . Tonsillectomy    . Caesarean section      x 1  . Mastectomy  04/1987    right side  . Abdominal hysterectomy  06/1988    fibroids  . Portacath placement Right 10/28/2012    Procedure: PORT PLACEMENT;  Surgeon: Joyice Faster. Cornett, MD;  Location: [facility];  Service: General;  Laterality: Right;  Right Subclavian Vein  . Mastectomy modified radical Left 07/19/2013    Procedure: MASTECTOMY MODIFIED RADICAL;  Surgeon: Marcello Moores A. Cornett, MD;  Location: [facility];  Service: General;  Laterality: Left;  . Port-a-cath removal Right 07/19/2013    Procedure: REMOVAL PORT-A-CATH;  Surgeon: Joyice Faster. Cornett, MD;  Location: [facility];  Service: General;  Laterality: Right;  . Breast surgery Left 07/19/13    MRM    Social History    . Marital Status: Married    Spouse Name: Donnie  . Number of Children: 1  . Years of Education: N/A   Occupational History  . Unemployed    Social History Main Topics  . Smoking status: Never Smoker   . Smokeless tobacco: Never Used  . Alcohol Use: No  . Drug Use: No  . Sexual Activity: Not Currently     Comment: menarche 56, 1st pregnancy age 26-miscarr, age 34 1st live birth, menop 38   Other Topics Concern  . Not on file      Allergies: Sulfa antibiotics and Sulfonamide derivatives  Medications: Prior to Admission medications   Medication Sig Start Date End Date Taking? Authorizing Provider  cholecalciferol (VITAMIN D) 1000 UNITS tablet Take 1,000 Units by mouth daily.   Yes Historical Provider, MD  diltiazem (CARDIZEM CD) 240 MG 24 hr capsule Take 240 mg by mouth daily.   Yes Historical Provider, MD  esomeprazole (NEXIUM) 20 MG capsule Take 20 mg by mouth daily at 12 noon.   Yes Historical Provider, MD  fluticasone (FLONASE) 50 MCG/ACT nasal spray Place 2 sprays into both nostrils daily as needed for allergies or rhinitis.   Yes Historical Provider, MD  latanoprost (XALATAN) 0.005 % ophthalmic solution Place 1 drop into both eyes at bedtime.    Yes Historical Provider,  MD  levothyroxine (SYNTHROID, LEVOTHROID) 50 MCG tablet Take 1 tablet (50 mcg total) by mouth daily. 10/06/14  Yes Lucretia Kern, DO  LORazepam (ATIVAN) 0.5 MG tablet Take 1 tablet (0.5 mg total) by mouth every 8 (eight) hours. 10/20/13  Yes Minette Headland, NP  losartan-hydrochlorothiazide (HYZAAR) 50-12.5 MG per tablet Take 1 tablet by mouth daily. 09/22/14  Yes Lucretia Kern, DO  naproxen sodium (ANAPROX) 220 MG tablet Take 440 mg by mouth every 8 (eight) hours as needed (For shoulder pain.).   Yes Historical Provider, MD  NON FORMULARY Apply 1 application topically daily as needed (Applies to feet for athlete's foot.). Fungifoam   Yes Historical Provider, MD    Review of Systems  All other systems  reviewed and are negative.   Vital Signs: BP 127/51 mmHg  Pulse 88  Temp(Src) 97.9 F (36.6 C) (Oral)  Resp 18  Ht 5\' 4"  (1.626 m)  Wt 225 lb 11.2 oz (102.377 kg)  BMI 38.72 kg/m2  SpO2 92%  Physical Exam  Constitutional: She is oriented to person, place, and time. She appears well-developed and well-nourished. No distress.  Neck: Normal range of motion. No JVD present. No tracheal deviation present.  Cardiovascular: Normal rate, regular rhythm and normal heart sounds.   Pulmonary/Chest: Effort normal and breath sounds normal. No respiratory distress.  Abdominal: Soft. Bowel sounds are normal. She exhibits no distension.  Neurological: She is alert and oriented to person, place, and time.  Psychiatric: She has a normal mood and affect. Judgment normal.    Imaging: Ct Chest W Contrast  10/30/2014   CLINICAL DATA:  Patient with nausea and vomiting. Chest radiograph demonstrating pulmonary nodules. History of right mastectomy.  EXAM: CT CHEST, ABDOMEN, AND PELVIS WITH CONTRAST  TECHNIQUE: Multidetector CT imaging of the chest, abdomen and pelvis was performed following the standard protocol during bolus administration of intravenous contrast.  CONTRAST:  179mL OMNIPAQUE IOHEXOL 300 MG/ML  SOLN  COMPARISON:  CT 10/27/2012  FINDINGS: CT CHEST FINDINGS  Mediastinum/Nodes: Visualized thyroid is unremarkable. Status post bilateral axillary node dissection. There is a 1.6 cm right hilar lymph node (image 24; series 2). There is a 1.2 cm subcarinal lymph node (image 24; series 2). Additional 10 mm right hilar lymph node (image 27; series 2). Normal heart size. Trace fluid within the superior pericardial recess.  Lungs/Pleura: Central airways are patent. Interval development of multiple bilateral pulmonary nodules and right lower lobe mass. Right lower lobe mass measures 3.2 x 2.9 cm (image 35; series 4). Reference right upper lobe nodule measures 10 mm (image 17; series 4). Reference left lower lobe  nodule measures 1.3 cm (image 32; series 4). New moderate right pleural effusion. Subpleural consolidative opacities within the right lower lobe.  Musculoskeletal: Postoperative changes compatible with right mastectomy. Additionally there are postoperative changes compatible with interval left mastectomy. There is soft tissue thickening and irregularity at the left mastectomy site (image 32; series 2). Additionally there is a 15 x 0 3 cm fluid collection within the left anterior chest wall soft tissues at the level of the mastectomy site. The left pectoralis musculature is thickened and irregular in appearance.  CT ABDOMEN AND PELVIS FINDINGS  Hepatobiliary: Liver is normal in size and contour. There is a 0.8 cm low-attenuation lesion within the right hepatic lobe (image 43; series 2). There is a large stone within the gallbladder. No gallbladder wall thickening. No intrahepatic or extrahepatic biliary ductal dilatation.  Pancreas: Unremarkable  Spleen: Unremarkable  Adrenals/Urinary  Tract: Normal adrenal glands. Kidneys enhance symmetrically with contrast. No hydronephrosis.  Stomach/Bowel: No abnormal bowel wall thickening or evidence for bowel obstruction. The appendix is normal. Diastases of the rectus abdominus musculature.  Vascular/Lymphatic: Normal caliber abdominal aorta. No retroperitoneal lymphadenopathy.  Other: Status post hysterectomy.  Adnexal structures unremarkable.  Musculoskeletal: No aggressive or acute appearing osseous lesions.  IMPRESSION: 1. Interval development of multiple bilateral pulmonary nodules compatible with metastatic disease. New moderate right pleural effusion potentially secondary to malignant effusion. 2. Interval development of mediastinal and right hilar adenopathy concerning for metastatic disease. 3. Small low-attenuation lesion within the liver, metastatic disease not excluded. 4. Postoperative changes compatible with interval left mastectomy. There is overlying skin  thickening and nodularity of the mastectomy site. Additionally there is irregularity and thickening of the adjacent pectoralis musculature. Localized recurrence is not excluded. 5. Large 15 cm fluid collection at the postoperative site within the anterior left chest wall which may represent seroma.   Electronically Signed   By: Lovey Newcomer M.D.   On: 10/30/2014 16:03   Ct Abdomen Pelvis W Contrast  10/30/2014   CLINICAL DATA:  Patient with nausea and vomiting. Chest radiograph demonstrating pulmonary nodules. History of right mastectomy.  EXAM: CT CHEST, ABDOMEN, AND PELVIS WITH CONTRAST  TECHNIQUE: Multidetector CT imaging of the chest, abdomen and pelvis was performed following the standard protocol during bolus administration of intravenous contrast.  CONTRAST:  163mL OMNIPAQUE IOHEXOL 300 MG/ML  SOLN  COMPARISON:  CT 10/27/2012  FINDINGS: CT CHEST FINDINGS  Mediastinum/Nodes: Visualized thyroid is unremarkable. Status post bilateral axillary node dissection. There is a 1.6 cm right hilar lymph node (image 24; series 2). There is a 1.2 cm subcarinal lymph node (image 24; series 2). Additional 10 mm right hilar lymph node (image 27; series 2). Normal heart size. Trace fluid within the superior pericardial recess.  Lungs/Pleura: Central airways are patent. Interval development of multiple bilateral pulmonary nodules and right lower lobe mass. Right lower lobe mass measures 3.2 x 2.9 cm (image 35; series 4). Reference right upper lobe nodule measures 10 mm (image 17; series 4). Reference left lower lobe nodule measures 1.3 cm (image 32; series 4). New moderate right pleural effusion. Subpleural consolidative opacities within the right lower lobe.  Musculoskeletal: Postoperative changes compatible with right mastectomy. Additionally there are postoperative changes compatible with interval left mastectomy. There is soft tissue thickening and irregularity at the left mastectomy site (image 32; series 2).  Additionally there is a 15 x 0 3 cm fluid collection within the left anterior chest wall soft tissues at the level of the mastectomy site. The left pectoralis musculature is thickened and irregular in appearance.  CT ABDOMEN AND PELVIS FINDINGS  Hepatobiliary: Liver is normal in size and contour. There is a 0.8 cm low-attenuation lesion within the right hepatic lobe (image 43; series 2). There is a large stone within the gallbladder. No gallbladder wall thickening. No intrahepatic or extrahepatic biliary ductal dilatation.  Pancreas: Unremarkable  Spleen: Unremarkable  Adrenals/Urinary Tract: Normal adrenal glands. Kidneys enhance symmetrically with contrast. No hydronephrosis.  Stomach/Bowel: No abnormal bowel wall thickening or evidence for bowel obstruction. The appendix is normal. Diastases of the rectus abdominus musculature.  Vascular/Lymphatic: Normal caliber abdominal aorta. No retroperitoneal lymphadenopathy.  Other: Status post hysterectomy.  Adnexal structures unremarkable.  Musculoskeletal: No aggressive or acute appearing osseous lesions.  IMPRESSION: 1. Interval development of multiple bilateral pulmonary nodules compatible with metastatic disease. New moderate right pleural effusion potentially secondary to malignant effusion. 2.  Interval development of mediastinal and right hilar adenopathy concerning for metastatic disease. 3. Small low-attenuation lesion within the liver, metastatic disease not excluded. 4. Postoperative changes compatible with interval left mastectomy. There is overlying skin thickening and nodularity of the mastectomy site. Additionally there is irregularity and thickening of the adjacent pectoralis musculature. Localized recurrence is not excluded. 5. Large 15 cm fluid collection at the postoperative site within the anterior left chest wall which may represent seroma.   Electronically Signed   By: Lovey Newcomer M.D.   On: 10/30/2014 16:03   Dg Abd Acute W/chest  10/30/2014    CLINICAL DATA:  History of breast carcinoma with nausea and vomiting  EXAM: ACUTE ABDOMEN SERIES (ABDOMEN 2 VIEW & CHEST 1 VIEW)  COMPARISON:  Chest radiograph October 28, 2012; CT abdomen and pelvis October 27, 2012  FINDINGS: Multiple mass lesions are noted throughout the lungs consistent with metastatic foci. Nodular lesions range in size from as small as 1 cm to as large as 3.7 x 3.3 cm. The largest mass lesion is in the right base region. There is no edema or consolidation. Heart is upper normal in size with pulmonary vascularity within normal limits. No adenopathy is appreciable. There are surgical clips in each axillary region.  Supine and upright abdomen: There is no bowel dilatation or air-fluid level suggesting obstruction. No free air. There are phleboliths in pelvis. There is degenerative change in the lower thoracic and lumbar spine regions.  IMPRESSION: Multiple nodular lesions in the lungs consistent with pulmonary metastases. No edema or consolidation. Bowel gas pattern unremarkable. No obstruction or free air.   Electronically Signed   By: Lowella Grip III M.D.   On: 10/30/2014 09:27    Labs:  CBC:  Recent Labs  02/16/14 1450 05/17/14 1102 10/30/14 0905 10/30/14 1300  WBC 5.5 4.5 5.9 7.9  HGB 12.3 12.0 12.4 12.5  HCT 37.2 36.4 36.7 37.6  PLT 244 241 228 225    COAGS:  Recent Labs  10/31/14 0613  INR 1.01    BMP:  Recent Labs  12/14/13 1036  03/22/14 1118 04/19/14 1129 05/17/14 1103 10/30/14 0905 10/30/14 1300  NA 141  < > 141 142 142 139  --   K 3.0*  < > 3.6 3.5 3.6 3.3*  --   CL 102  --   --   --   --  100  --   CO2 31  < > 29 27 27 28   --   GLUCOSE 112*  < > 133 151* 147* 253*  --   BUN 12  < > 9.4 14.3 12.9 11  --   CALCIUM 9.5  < > 9.9 9.7 10.1 9.1  --   CREATININE 0.8  < > 0.9 0.9 0.9 0.77 0.74  GFRNONAA  --   --   --   --   --  >90 >90  GFRAA  --   --   --   --   --  >90 >90  < > = values in this interval not displayed.  LIVER FUNCTION  TESTS:  Recent Labs  03/22/14 1118 04/19/14 1129 05/17/14 1103 10/30/14 0905  BILITOT 0.40 0.53 0.48 0.1*  AST 18 16 17 31   ALT 11 7 14 21   ALKPHOS 123 117 128 95  PROT 7.3 7.1 7.4 7.1  ALBUMIN 4.0 3.8 3.9 4.2    Assessment and Plan: Multiple pulmonary masses and right pleural effusion Imaging reviewed, needs tissue biopsy. May need thoracentesis at  time of biopsy to reduce procedural risks. Labs reviewed, ok Risks and Benefits discussed with the patient including, but not limited to bleeding, hemoptysis, respiratory failure requiring intubation, infection, pneumothorax requiring chest tube placement, stroke from air embolism or even death. All of the patient's questions were answered, patient is agreeable to proceed. Consent signed and in chart.     I spent a total of 20 minutes face to face in clinical consultation/evaluation, greater than 50% of which was counseling/coordinating care for lung mass biopsy  Signed: Ascencion Dike 10/31/2014, 8:48 AM

## 2014-11-01 ENCOUNTER — Telehealth: Payer: Self-pay | Admitting: Radiology

## 2014-11-01 NOTE — Progress Notes (Signed)
Pt underwent successful (R)Lung mass biopsy on 3/29. No immediate complications  She was discharged by primary team shortly after returning to her room. She was in stable condition but had not yet had her 2 hr follow up CXR.  Called pt today, she is feeling fine. Denies CP, SOB, hemoptysis, fever. At this point, do not feel pt needs to return for CXR, but should call if she becomes symptomatic.  Ascencion Dike PA-C Interventional Radiology 11/01/2014 12:56 PM

## 2014-11-07 ENCOUNTER — Telehealth: Payer: Self-pay | Admitting: Family Medicine

## 2014-11-07 ENCOUNTER — Encounter: Payer: Self-pay | Admitting: Family Medicine

## 2014-11-07 ENCOUNTER — Ambulatory Visit (INDEPENDENT_AMBULATORY_CARE_PROVIDER_SITE_OTHER): Payer: BLUE CROSS/BLUE SHIELD | Admitting: Family Medicine

## 2014-11-07 VITALS — BP 130/84 | HR 94 | Temp 98.4°F | Wt 218.0 lb

## 2014-11-07 DIAGNOSIS — I10 Essential (primary) hypertension: Secondary | ICD-10-CM | POA: Diagnosis not present

## 2014-11-07 DIAGNOSIS — E038 Other specified hypothyroidism: Secondary | ICD-10-CM

## 2014-11-07 DIAGNOSIS — C50911 Malignant neoplasm of unspecified site of right female breast: Secondary | ICD-10-CM | POA: Diagnosis not present

## 2014-11-07 DIAGNOSIS — K219 Gastro-esophageal reflux disease without esophagitis: Secondary | ICD-10-CM

## 2014-11-07 DIAGNOSIS — E119 Type 2 diabetes mellitus without complications: Secondary | ICD-10-CM

## 2014-11-07 DIAGNOSIS — C50912 Malignant neoplasm of unspecified site of left female breast: Secondary | ICD-10-CM

## 2014-11-07 MED ORDER — METFORMIN HCL 500 MG PO TABS: 1000.0000 mg | ORAL_TABLET | Freq: Two times a day (BID) | ORAL | Status: DC

## 2014-11-07 MED ORDER — LEVOTHYROXINE SODIUM 50 MCG PO TABS: 50.0000 ug | ORAL_TABLET | Freq: Every day | ORAL | Status: AC

## 2014-11-07 NOTE — Telephone Encounter (Signed)
emmi emailed °

## 2014-11-07 NOTE — Progress Notes (Signed)
HPI:  Christine [name] is a pleasant 59 yo F whom established care with me about 1 year ago and whom I have not seen since. She has a PMH sig for Breast Ca, followed by her oncologist, HTN, Hypothyroidism, GERD and a new diagnosis of diabetes. It appears she was hospitalized recently for nausea and vomiting and unfortunately it was found she had recurrence of cancer and new lung metastasis was found. She was told to follow up with me in regards to her new diabetes.  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 and is seeing podiatrist for this  GERD: -meds: nexium -denies: vomiting or abd pain since returning home, dysphagia -hx of hiatal hernia  Breast cancer/possible lung mets: -has appointment with oncologist to follow up on path results and plan -denies CP, SOB, DOE, cough  HTN: -meds: losartan-hctz 50-12.5 -mild hypokalemia chornically - reports she used to take potassium for this but stopped about 6 months ago -denies: CP, SOB, DOE  GAD: -she was given ativan in the hospital - but has not needed -denies anxiety, panic recently -reports coping ok despite the circumstances   ROS: See pertinent positives and negatives per HPI.  Past Medical History  Diagnosis Date  . ALLERGIC RHINITIS   . GERD (gastroesophageal reflux disease)   . Hypertension   . Hypothyroidism   . Hx of colonic polyps   . Wears glasses   . Breast cancer 46, age 3    right  . Breast cancer 09/30/12    left, ER/PR -, Her 2 -  . Radiation 09/15/13-11/01/13    Left chest wall/PAB/scar/supraclavicular fossa  . SYNCOPE 05/15/2010    Qualifier: Diagnosis of  By: Arnoldo Morale MD, [place], WITH RADICULOPATHY 10/01/2007    Qualifier: Diagnosis of  By: Arnoldo Morale MD, [place] 05/31/2007    Qualifier:  Diagnosis of  By: Arnoldo Morale MD, [place], HX OF 05/31/2007    Qualifier: Diagnosis of  By: Arnoldo Morale MD, Balinda Quails   . Lymph edema L arm from breast ca tx 12/22/2013    Past Surgical History  Procedure Laterality Date  . Tonsillectomy    . Caesarean section      x 1  . Mastectomy  04/1987    right side  . Abdominal hysterectomy  06/1988    fibroids  . Portacath placement Right 10/28/2012    Procedure: PORT PLACEMENT;  Surgeon: Joyice Faster. Cornett, MD;  Location: [facility];  Service: General;  Laterality: Right;  Right Subclavian Vein  . Mastectomy modified radical Left 07/19/2013    Procedure: MASTECTOMY MODIFIED RADICAL;  Surgeon: Marcello Moores A. Cornett, MD;  Location: [facility];  Service: General;  Laterality: Left;  . Port-a-cath removal Right 07/19/2013    Procedure: REMOVAL PORT-A-CATH;  Surgeon: Joyice Faster. Cornett, MD;  Location: [facility];  Service: General;  Laterality: Right;  . Breast surgery Left 07/19/13    MRM    Family History  Problem Relation Age of Onset  . Colon cancer Mother 1    family hx of colon ca 1st degree relative <60  . Coronary artery disease Father     family hx of CAD female 1st degree relative <50  . Stomach cancer Neg Hx   .  Rectal cancer Neg Hx   . Esophageal cancer Neg Hx   . Breast cancer Maternal Grandmother 80  . Diabetes Paternal Grandmother   . Breast cancer Cousin 14    maternal cousin  . Coronary artery disease Maternal Grandfather   . Breast cancer Sister     paternal half sister; died in her 10s    History   Social History  . Marital Status: Married    Spouse Name: Donnie  . Number of Children: 1  . Years of Education: N/A   Occupational History  . Unemployed    Social History Main Topics  . Smoking status: Never Smoker   . Smokeless tobacco: Never Used  . Alcohol Use: No  . Drug Use: No  . Sexual Activity: Not Currently     Comment: menarche 44, 1st pregnancy age  15-miscarr, age 26 1st live birth, menop 41   Other Topics Concern  . None   Social History Narrative   Married. Lives w/ husband.  Has not worked since being diagnosed with cancer 2 years ago.  Previously worked as a Solicitor.  Independent with ADLs.      Spiritual Beliefs:Christian      Lifestyle: started the [employer] exercise program for cancer survivors (12/2013); working on diet              Current outpatient prescriptions:  .  alum & mag hydroxide-simeth (MAALOX/MYLANTA) 092-330-07 MG/5ML suspension, Take 30 mLs by mouth every 6 (six) hours as needed for indigestion or heartburn (dyspepsia)., Disp: 355 mL, Rfl: 0 .  cholecalciferol (VITAMIN D) 1000 UNITS tablet, Take 1,000 Units by mouth daily., Disp: , Rfl:  .  diltiazem (CARDIZEM CD) 240 MG 24 hr capsule, Take 240 mg by mouth daily., Disp: , Rfl:  .  esomeprazole (NEXIUM) 20 MG capsule, Take 20 mg by mouth daily at 12 noon., Disp: , Rfl:  .  fluticasone (FLONASE) 50 MCG/ACT nasal spray, Place 2 sprays into both nostrils daily as needed for allergies or rhinitis., Disp: , Rfl:  .  latanoprost (XALATAN) 0.005 % ophthalmic solution, Place 1 drop into both eyes at bedtime. , Disp: , Rfl:  .  levothyroxine (SYNTHROID, LEVOTHROID) 50 MCG tablet, Take 1 tablet (50 mcg total) by mouth daily., Disp: 90 tablet, Rfl: 3 .  LORazepam (ATIVAN) 0.5 MG tablet, Take 1 tablet (0.5 mg total) by mouth every 8 (eight) hours., Disp: 30 tablet, Rfl: 0 .  losartan-hydrochlorothiazide (HYZAAR) 50-12.5 MG per tablet, Take 1 tablet by mouth daily., Disp: 90 tablet, Rfl: 0 .  NON FORMULARY, Apply 1 application topically daily as needed (Applies to feet for athlete's foot.). Fungifoam, Disp: , Rfl:  .  ondansetron (ZOFRAN) 4 MG tablet, Take 1 tablet (4 mg total) by mouth every 6 (six) hours as needed for nausea., Disp: 20 tablet, Rfl: 0 .  oxyCODONE (OXY IR/ROXICODONE) 5 MG immediate release tablet, Take 1 tablet (5 mg total) by mouth every 4  (four) hours as needed for moderate pain., Disp: 30 tablet, Rfl: 0 .  metFORMIN (GLUCOPHAGE) 500 MG tablet, Take 2 tablets (1,000 mg total) by mouth 2 (two) times daily with a meal., Disp: 120 tablet, Rfl: 1  EXAM:  Filed Vitals:   11/07/14 1338  BP: 130/84  Pulse: 94  Temp: 98.4 F (36.9 C)    Body mass index is 37.4 kg/(m^2).  GENERAL: vitals reviewed and listed above, alert, oriented, appears well hydrated and in no acute distress  HEENT: atraumatic, conjunttiva clear,  no obvious abnormalities on inspection of external nose and ears  NECK: no obvious masses on inspection  LUNGS: clear to auscultation bilaterally, no wheezes, rales or rhonchi, good air movement  CV: HRRR, no peripheral edema  MS: moves all extremities without noticeable abnormality  PSYCH: pleasant and cooperative, no obvious depression or anxiety  ASSESSMENT AND PLAN:  Discussed the following assessment and plan:  Type 2 diabetes mellitus without complication - Plan: Ambulatory referral to diabetic education  Bilateral breast cancer  Essential hypertension  Other specified hypothyroidism  Morbid obesity  Gastroesophageal reflux disease, esophagitis presence not specified  -supported  -discussed the new dx of diabetes and discussed medication options and lifestyle recs - she opted to titrate metformin and see diabetes educators -foot exam done -advise optho eval  -follow up with oncology as planned -follow up hre in 3 months -Patient advised to return or notify a doctor immediately if symptoms worsen or persist or new concerns arise.  Patient Instructions  BEFORE YOU LEAVE: -schedule follow up in 3 month  FOR THE DIABETES: -please start the metformin 500mg  daily in the morning for 1 week, then 500mg  twice daily for 1 week, then 1000mg  in the morning and 500 mg in the evening for 1 week then FINALLY 100mg  twice daily. Call in 3 weeks to let us know how this is going and we can send 1000mg   tablets 90 days to your pharmacy if needed.  We placed a referral for you as discussed to the diabetes educator. It usually takes about 1-2 weeks to process and schedule this referral. If you have not heard from [place] regarding this appointment in 2 weeks please contact our office.  FOR YOUR DIABETES:  []  Eat a healthy low carb diet (avoid sweets, sweet drinks, breads, potatoes, rice, etc.) and ensure 3 small meals daily.  []  Get AT LEAST 150 minutes of cardiovascular exercise per week - 30 minutes per day is best of sustained sweaty exercise.  []  Take all of your medications every day as directed by your doctor. Call your doctor immediately if you have any questions about your medications or are running low.  []  If any low blood sugars < 70, eat a snack and call your doctor immediately.  []  See an eye doctor every year and fax your diabetic eye exam to our office.  Fax: [phone]  []  Take good care of your feet and keep them soft and callus free. Check your feet daily and wear comfortable shoes. See your doctor immediately if you have any cuts, calluses or wounds on your feet.       Colin Benton R.

## 2014-11-07 NOTE — Patient Instructions (Signed)
BEFORE YOU LEAVE: -schedule follow up in 3 month  FOR THE DIABETES: -please start the metformin 500mg  daily in the morning for 1 week, then 500mg  twice daily for 1 week, then 1000mg  in the morning and 500 mg in the evening for 1 week then FINALLY 100mg  twice daily. Call in 3 weeks to let us know how this is going and we can send 1000mg  tablets 90 days to your pharmacy if needed.  We placed a referral for you as discussed to the diabetes educator. It usually takes about 1-2 weeks to process and schedule this referral. If you have not heard from [place] regarding this appointment in 2 weeks please contact our office.  FOR YOUR DIABETES:  []  Eat a healthy low carb diet (avoid sweets, sweet drinks, breads, potatoes, rice, etc.) and ensure 3 small meals daily.  []  Get AT LEAST 150 minutes of cardiovascular exercise per week - 30 minutes per day is best of sustained sweaty exercise.  []  Take all of your medications every day as directed by your doctor. Call your doctor immediately if you have any questions about your medications or are running low.  []  If any low blood sugars < 70, eat a snack and call your doctor immediately.  []  See an eye doctor every year and fax your diabetic eye exam to our office.  Fax: [phone]  []  Take good care of your feet and keep them soft and callus free. Check your feet daily and wear comfortable shoes. See your doctor immediately if you have any cuts, calluses or wounds on your feet.

## 2014-11-07 NOTE — Progress Notes (Signed)
Pre visit review using our clinic review tool, if applicable. No additional management support is needed unless otherwise documented below in the visit note. 

## 2014-11-08 ENCOUNTER — Telehealth: Payer: Self-pay | Admitting: Family Medicine

## 2014-11-08 NOTE — Telephone Encounter (Signed)
I called the pt an she stated she had an EKG at [facility] and was given a blood thinner as they told her this was abnormal but did not say what the abnormality was? Patient denies having chest pain or other symptoms at the time, states chest x-ray was done due to hiatal hernia.  Patient states it is OK to leave a detailed message on her machine if she does not answer.

## 2014-11-08 NOTE — Telephone Encounter (Signed)
Christine [name], Please find out what her request is. Thanks.

## 2014-11-08 NOTE — Telephone Encounter (Signed)
[phone] (home)  Mobile [phone]. Called Rayette. - discussed her EKG findings - she has had ekg abnormalities since 2011. Has had no symptoms. Cardiac makers normal. She opted for no further eval. May discuss with oncology incase any future treatments cardiotoxic and may want to see cardiologist. Advised prompt eval if ever symptoms.

## 2014-11-08 NOTE — Telephone Encounter (Signed)
Pt called and said she was in on yesterday and forgot to ask about a test she had in the hospital

## 2014-11-15 ENCOUNTER — Other Ambulatory Visit: Payer: Self-pay | Admitting: *Deleted

## 2014-11-15 ENCOUNTER — Ambulatory Visit (HOSPITAL_COMMUNITY)
Admission: RE | Admit: 2014-11-15 | Discharge: 2014-11-15 | Disposition: A | Discharge status: Home / Self Care | Payer: BLUE CROSS/BLUE SHIELD | Source: Ambulatory Visit | Attending: Nurse Practitioner | Admitting: Nurse Practitioner

## 2014-11-15 ENCOUNTER — Encounter (HOSPITAL_COMMUNITY): Payer: Self-pay

## 2014-11-15 ENCOUNTER — Telehealth: Payer: Self-pay | Admitting: *Deleted

## 2014-11-15 ENCOUNTER — Ambulatory Visit (HOSPITAL_BASED_OUTPATIENT_CLINIC_OR_DEPARTMENT_OTHER): Payer: BLUE CROSS/BLUE SHIELD | Admitting: Nurse Practitioner

## 2014-11-15 ENCOUNTER — Telehealth: Payer: Self-pay | Admitting: Hematology and Oncology

## 2014-11-15 ENCOUNTER — Other Ambulatory Visit (HOSPITAL_BASED_OUTPATIENT_CLINIC_OR_DEPARTMENT_OTHER): Payer: BLUE CROSS/BLUE SHIELD

## 2014-11-15 VITALS — BP 120/58 | HR 100 | Temp 98.1°F | Resp 24 | Wt 218.4 lb

## 2014-11-15 DIAGNOSIS — C7800 Secondary malignant neoplasm of unspecified lung: Secondary | ICD-10-CM

## 2014-11-15 DIAGNOSIS — J90 Pleural effusion, not elsewhere classified: Secondary | ICD-10-CM | POA: Diagnosis not present

## 2014-11-15 DIAGNOSIS — R918 Other nonspecific abnormal finding of lung field: Secondary | ICD-10-CM | POA: Diagnosis not present

## 2014-11-15 DIAGNOSIS — C50911 Malignant neoplasm of unspecified site of right female breast: Secondary | ICD-10-CM

## 2014-11-15 DIAGNOSIS — C50912 Malignant neoplasm of unspecified site of left female breast: Secondary | ICD-10-CM | POA: Diagnosis not present

## 2014-11-15 DIAGNOSIS — I890 Lymphedema, not elsewhere classified: Secondary | ICD-10-CM | POA: Diagnosis not present

## 2014-11-15 DIAGNOSIS — J910 Malignant pleural effusion: Secondary | ICD-10-CM

## 2014-11-15 DIAGNOSIS — Z853 Personal history of malignant neoplasm of breast: Secondary | ICD-10-CM | POA: Diagnosis not present

## 2014-11-15 DIAGNOSIS — C50412 Malignant neoplasm of upper-outer quadrant of left female breast: Secondary | ICD-10-CM

## 2014-11-15 DIAGNOSIS — E876 Hypokalemia: Secondary | ICD-10-CM

## 2014-11-15 DIAGNOSIS — C773 Secondary and unspecified malignant neoplasm of axilla and upper limb lymph nodes: Secondary | ICD-10-CM | POA: Diagnosis not present

## 2014-11-15 LAB — COMPREHENSIVE METABOLIC PANEL (CC13)
ALT: 19 U/L (ref 0–55)
AST: 26 U/L (ref 5–34)
Albumin: 3.9 g/dL (ref 3.5–5.0)
Alkaline Phosphatase: 83 U/L (ref 40–150)
Anion Gap: 16 mEq/L — ABNORMAL HIGH (ref 3–11)
BILIRUBIN TOTAL: 0.51 mg/dL (ref 0.20–1.20)
BUN: 15.9 mg/dL (ref 7.0–26.0)
CALCIUM: 9.3 mg/dL (ref 8.4–10.4)
CHLORIDE: 103 meq/L (ref 98–109)
CO2: 26 mEq/L (ref 22–29)
CREATININE: 0.9 mg/dL (ref 0.6–1.1)
EGFR: 86 mL/min/{1.73_m2} — ABNORMAL LOW (ref >90)
Glucose: 145 mg/dl — ABNORMAL HIGH (ref 70–140)
Potassium: 3 mEq/L — CL (ref 3.5–5.1)
Sodium: 144 mEq/L (ref 136–145)
Total Protein: 7.3 g/dL (ref 6.4–8.3)

## 2014-11-15 LAB — CBC WITH DIFFERENTIAL/PLATELET
BASO%: 0 % (ref 0.0–2.0)
BASOS ABS: 0 10*3/uL (ref 0.0–0.1)
EOS%: 2.7 % (ref 0.0–7.0)
Eosinophils Absolute: 0.2 10*3/uL (ref 0.0–0.5)
HEMATOCRIT: 39 % (ref 34.8–46.6)
HGB: 13 g/dL (ref 11.6–15.9)
LYMPH#: 1.2 10*3/uL (ref 0.9–3.3)
LYMPH%: 21.7 % (ref 14.0–49.7)
MCH: 30.6 pg (ref 25.1–34.0)
MCHC: 33.3 g/dL (ref 31.5–36.0)
MCV: 91.8 fL (ref 79.5–101.0)
MONO#: 0.6 10*3/uL (ref 0.1–0.9)
MONO%: 10 % (ref 0.0–14.0)
NEUT#: 3.7 10*3/uL (ref 1.5–6.5)
NEUT%: 65.6 % (ref 38.4–76.8)
Platelets: 270 10*3/uL (ref 145–400)
RBC: 4.25 10*6/uL (ref 3.70–5.45)
RDW: 13.4 % (ref 11.2–14.5)
WBC: 5.6 10*3/uL (ref 3.9–10.3)

## 2014-11-15 MED ORDER — IOHEXOL 350 MG/ML SOLN
100.0000 mL | Freq: Once | INTRAVENOUS | Status: AC | PRN
  Administered 2014-11-15: 10 mL via INTRAVENOUS

## 2014-11-15 NOTE — Telephone Encounter (Signed)
Received VM from patient on TRIAGE line stating she had a lung biopsy a few weeks ago and is experiencing increased SOB and coughing and wheezing. Returned call to patient to collect more information - patient states these symptoms did not start until several days after she was discharged from the hospital (which was 10/31/14). Patient states she shortness of breath has been getting worse the last few days especially when she is ambulatory. Denies any SOB when she is sitting down and not moving. Patient denies any other symptoms including chest pain or fevers.  According to EMR, patient did not have followup CXR done after lung biopsy. Orders placed for chest x-ray and labs and urgent POF placed for patient to see Selena Lesser, NP today. Patient agreeable to get her husband to bring her in for chest x-ray at [facility] and then to [facility] for labs and to see Cyndee.   Juliann Pulse, RN with [facility] today notified of patient.

## 2014-11-15 NOTE — Telephone Encounter (Signed)
Left message to confirm appointment for 04/14

## 2014-11-15 NOTE — Telephone Encounter (Signed)
Received call from Opal Sidles at [facility] calling report on CT chest angiogram patient had today. Called Selena Lesser, NP to notify her of this - Cyndee states she has seen the scan results.

## 2014-11-16 ENCOUNTER — Telehealth: Payer: Self-pay | Admitting: *Deleted

## 2014-11-16 ENCOUNTER — Telehealth: Payer: Self-pay | Admitting: Hematology and Oncology

## 2014-11-16 ENCOUNTER — Other Ambulatory Visit: Payer: Self-pay | Admitting: *Deleted

## 2014-11-16 ENCOUNTER — Ambulatory Visit (HOSPITAL_BASED_OUTPATIENT_CLINIC_OR_DEPARTMENT_OTHER): Payer: BLUE CROSS/BLUE SHIELD | Admitting: Hematology and Oncology

## 2014-11-16 VITALS — BP 129/78 | HR 93 | Temp 97.9°F | Resp 19 | Ht 64.0 in | Wt 218.6 lb

## 2014-11-16 DIAGNOSIS — C50912 Malignant neoplasm of unspecified site of left female breast: Principal | ICD-10-CM

## 2014-11-16 DIAGNOSIS — C7801 Secondary malignant neoplasm of right lung: Secondary | ICD-10-CM

## 2014-11-16 DIAGNOSIS — C773 Secondary and unspecified malignant neoplasm of axilla and upper limb lymph nodes: Secondary | ICD-10-CM

## 2014-11-16 DIAGNOSIS — C50412 Malignant neoplasm of upper-outer quadrant of left female breast: Secondary | ICD-10-CM | POA: Diagnosis not present

## 2014-11-16 DIAGNOSIS — C50911 Malignant neoplasm of unspecified site of right female breast: Secondary | ICD-10-CM

## 2014-11-16 MED ORDER — LORAZEPAM 0.5 MG PO TABS: 0.5000 mg | ORAL_TABLET | Freq: Four times a day (QID) | ORAL | Status: DC | PRN

## 2014-11-16 MED ORDER — LIDOCAINE-PRILOCAINE 2.5-2.5 % EX CREA: TOPICAL_CREAM | CUTANEOUS | Status: DC

## 2014-11-16 MED ORDER — PROCHLORPERAZINE MALEATE 10 MG PO TABS: 10.0000 mg | ORAL_TABLET | Freq: Four times a day (QID) | ORAL | Status: DC | PRN

## 2014-11-16 MED ORDER — ONDANSETRON HCL 8 MG PO TABS: 8.0000 mg | ORAL_TABLET | Freq: Two times a day (BID) | ORAL | Status: DC

## 2014-11-16 NOTE — Progress Notes (Signed)
Patient Care Team: Lucretia Kern, DO as PCP - General (Family Medicine)  DIAGNOSIS: Breast cancer of upper-outer quadrant of left female breast   Staging form: Breast, AJCC 7th Edition     Clinical: Stage IIIB (T4, N1, cM0) - Signed by Thea Silversmith, MD on 10/29/2012       Prognostic indicators: ER-, PR- HER2 -      Pathologic: No stage assigned - Unsigned       Prognostic indicators: ER-, PR- HER2 -    SUMMARY OF ONCOLOGIC HISTORY:   Breast cancer of upper-outer quadrant of left female breast   10/06/1986 Initial Diagnosis Right breast mastectomy, breast cancer diagnosed during pregnancy, no adjuvant treatment given   09/30/2012 Initial Biopsy Left breast biopsy done for left nipple retraction: Invasive ductal carcinoma grade 3, ER/PR negative, HER-2 negative, Ki-67 21% with grade 3 DCIS   10/12/2012 Breast MRI Left breast diffuse enhancement measuring 13 x 12.5 x 9.5 cm with diffuse skin thickening and dermal enhancement, multiple enlarged level I left axillary lymph nodes largest 2.4 cm   11/02/2012 - 06/06/2013 Neo-Adjuvant Chemotherapy Neoadjuvant FEC 4 followed by Taxol carbo 8 discontinued for neuropathy, Gemzar Carbo 2   09/15/2013 - 11/01/2013 Radiation Therapy Xeloda with adjuvant radiation   10/30/2014 Imaging Right hilar node 1.6 cm, subcarinal 1.2 cm, 1 cm additional right hilar, right lower lobe 3.2 cm, RUL 1 cm, and LDL of 1.3 cm, moderate pleural effusion, right liver 0.8 cm, 15 cm fluid collection left chest wall   10/31/2014 Initial Biopsy Lung biopsy right lower lobe: Carcinoma with Extensive necrosis, microscopic foci of nonnecrotic non-small cell, poorly differentiated carcinoma, metastatic disease likely ER 0%, PR 0%, HER-2 negative    CHIEF COMPLIANT: Shortness of breath  INTERVAL HISTORY: Christine [name] is a 59 year old lady with above-mentioned 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 She underwent a lung biopsy that revealed poorly differentiated carcinoma that was triple negative. Based on our prior history is suggestive of metastatic breast cancer. She presented yesterday complaining of worsening shortness of breath. She had a CT of the chest again repeated which did not show any pulmonary embolism but it did show significant right-sided pleural effusion.  REVIEW OF SYSTEMS:   Constitutional: Denies fevers, chills or abnormal weight loss Eyes: Denies blurriness of vision Ears, nose, mouth, throat, and face: Denies mucositis or sore throat Respiratory: Severe shortness of breath Cardiovascular: Denies palpitation, chest discomfort or lower extremity swelling Gastrointestinal:  Denies nausea, heartburn or change in bowel habits Skin: Denies abnormal skin rashes Lymphatics: Denies new lymphadenopathy or easy bruising Neurological:Denies numbness, tingling or new weaknesses Behavioral/Psych: Mood is stable, no new changes  Breast: Pain in the left chest wall related to the seroma All other systems were reviewed with the patient and are negative.  I have reviewed the past medical history, past surgical history, social history and family history with the patient and they are unchanged from previous note.  ALLERGIES:  is allergic to sulfa antibiotics and sulfonamide derivatives.  MEDICATIONS:  Current Outpatient Prescriptions  Medication Sig Dispense Refill  . alum & mag hydroxide-simeth (MAALOX/MYLANTA) 200-200-20 MG/5ML suspension Take 30 mLs by mouth every 6 (six) hours as needed for indigestion or heartburn (dyspepsia). 355 mL 0  . cholecalciferol (VITAMIN D) 1000 UNITS tablet Take 1,000 Units by mouth daily.    Marland Kitchen diltiazem (CARDIZEM CD) 240 MG 24 hr  capsule Take 240 mg by mouth daily.    Marland Kitchen esomeprazole (NEXIUM) 20 MG capsule Take 20 mg by mouth daily at 12 noon.    . fluticasone (FLONASE) 50 MCG/ACT nasal spray Place 2 sprays  into both nostrils daily as needed for allergies or rhinitis.    Marland Kitchen latanoprost (XALATAN) 0.005 % ophthalmic solution Place 1 drop into both eyes at bedtime.     Marland Kitchen levothyroxine (SYNTHROID, LEVOTHROID) 50 MCG tablet Take 1 tablet (50 mcg total) by mouth daily. 90 tablet 3  . losartan-hydrochlorothiazide (HYZAAR) 50-12.5 MG per tablet Take 1 tablet by mouth daily. 90 tablet 0  . metFORMIN (GLUCOPHAGE) 500 MG tablet Take 2 tablets (1,000 mg total) by mouth 2 (two) times daily with a meal. 120 tablet 1  . NON FORMULARY Apply 1 application topically daily as needed (Applies to feet for athlete's foot.). Fungifoam    . ondansetron (ZOFRAN) 4 MG tablet Take 1 tablet (4 mg total) by mouth every 6 (six) hours as needed for nausea. 20 tablet 0  . oxyCODONE (OXY IR/ROXICODONE) 5 MG immediate release tablet Take 1 tablet (5 mg total) by mouth every 4 (four) hours as needed for moderate pain. 30 tablet 0  . lidocaine-prilocaine (EMLA) cream Apply to affected area once 30 g 3  . LORazepam (ATIVAN) 0.5 MG tablet Take 1 tablet (0.5 mg total) by mouth every 6 (six) hours as needed (Nausea or vomiting). 30 tablet 0  . ondansetron (ZOFRAN) 8 MG tablet Take 1 tablet (8 mg total) by mouth 2 (two) times daily. Start the day after chemo for 2 days. Then take as needed for nausea or vomiting. 30 tablet 1  . prochlorperazine (COMPAZINE) 10 MG tablet Take 1 tablet (10 mg total) by mouth every 6 (six) hours as needed (Nausea or vomiting). 30 tablet 1   No current facility-administered medications for this visit.    PHYSICAL EXAMINATION: ECOG PERFORMANCE STATUS: 1 - Symptomatic but completely ambulatory  Filed Vitals:   11/16/14 1110  BP: 129/78  Pulse: 93  Temp: 97.9 F (36.6 C)  Resp: 19   Filed Weights   11/16/14 1110  Weight: 218 lb 9.6 oz (99.156 kg)    GENERAL:alert, no distress and comfortable SKIN: skin color, texture, turgor are normal, no rashes or significant lesions EYES: normal, Conjunctiva are  pink and non-injected, sclera clear OROPHARYNX:no exudate, no erythema and lips, buccal mucosa, and tongue normal  NECK: supple, thyroid normal size, non-tender, without nodularity LYMPH:  no palpable lymphadenopathy in the cervical, axillary or inguinal LUNGS: Diminished breath sounds at the right lung base HEART: regular rate & rhythm and no murmurs and no lower extremity edema ABDOMEN:abdomen soft, non-tender and normal bowel sounds Musculoskeletal:no cyanosis of digits and no clubbing  NEURO: alert & oriented x 3 with fluent speech, no focal motor/sensory deficits  LABORATORY DATA:  I have reviewed the data as listed   Chemistry      Component Value Date/Time   NA 144 11/15/2014 1053   NA 139 10/30/2014 0905   K 3.0* 11/15/2014 1053   K 3.3* 10/30/2014 0905   CL 100 10/30/2014 0905   CL 101 01/25/2013 0952   CO2 26 11/15/2014 1053   CO2 28 10/30/2014 0905   BUN 15.9 11/15/2014 1053   BUN 11 10/30/2014 0905   CREATININE 0.9 11/15/2014 1053   CREATININE 0.74 10/30/2014 1300      Component Value Date/Time   CALCIUM 9.3 11/15/2014 1053   CALCIUM 9.1 10/30/2014 0905  ALKPHOS 83 11/15/2014 1053   ALKPHOS 95 10/30/2014 0905   AST 26 11/15/2014 1053   AST 31 10/30/2014 0905   ALT 19 11/15/2014 1053   ALT 21 10/30/2014 0905   BILITOT 0.51 11/15/2014 1053   BILITOT 0.1* 10/30/2014 0905       Lab Results  Component Value Date   WBC 5.6 11/15/2014   HGB 13.0 11/15/2014   HCT 39.0 11/15/2014   MCV 91.8 11/15/2014   PLT 270 11/15/2014   NEUTROABS 3.7 11/15/2014     RADIOGRAPHIC STUDIES: I have personally reviewed the radiology reports and agreed with their findings. Dg Chest 2 View  11/15/2014   CLINICAL DATA:  Bilateral breast cancer 10/31/2014  EXAM: CHEST  2 VIEW  COMPARISON:  10/31/2014  FINDINGS: Cardiomediastinal silhouette is stable. Surgical clips are noted bilateral axilla. Bilateral pulmonary nodules are noted consistent with metastatic disease. There is  small right pleural effusion with right basilar atelectasis or infiltrate.  IMPRESSION: Bilateral pulmonary nodules again noted. Small right pleural effusion with right basilar atelectasis or infiltrate.   Electronically Signed   By: Lahoma Crocker M.D.   On: 11/15/2014 10:42   Ct Angio Chest Pe W/cm &/or Wo Cm  11/15/2014   CLINICAL DATA:  Two week history of shortness of breath. Known metastatic breast cancer.  EXAM: CT ANGIOGRAPHY CHEST WITH CONTRAST  TECHNIQUE: Multidetector CT imaging of the chest was performed using the standard protocol during bolus administration of intravenous contrast. Multiplanar CT image reconstructions and MIPs were obtained to evaluate the vascular anatomy.  CONTRAST:  70m OMNIPAQUE IOHEXOL 350 MG/ML SOLN  COMPARISON:  10/30/2014  FINDINGS: Chest wall: Stable surgical changes related to bilateral mastectomies. There is a large stable postoperative fluid collection involving the left lower chest wall. Persistent skin thickening without obvious mass. No axillary or supraclavicular adenopathy. The bony thorax is intact. No destructive bone lesions or spinal canal compromise. No obvious bony metastasis.  Mediastinum: The heart is normal in size. No pericardial effusion. Interval progression of right-sided mediastinal and hilar adenopathy. The aorta is normal in caliber. No dissection. The esophagus is grossly normal.  The pulmonary arterial tree is fairly well opacified. No definite filling defects to suggest pulmonary embolism.  Lungs/ pleura: Enlarging right-sided pleural effusion without obvious pleural nodules. There is progressive right middle lobe and right lower lobe compressive atelectasis and a enlarging perifissural fluid collection in the right lower lobe. Knee metastatic pulmonary nodules appear relatively stable. The large right lower lobe mass is partially obscured by fluid and atelectasis.  Upper abdomen: The posterior margin of the right hepatic lobe is very irregular.  Could not exclude peritoneal surface disease possibly extending down from the chest. No intrahepatic lesions are identified.  Review of the MIP images confirms the above findings.  IMPRESSION: 1. No CT findings for pulmonary embolism. 2. Enlarging right pleural effusion with progressive overlying atelectasis. 3. Progression of mediastinal and right hilar adenopathy. 4. Relatively stable pulmonary metastatic disease. 5. Possible peritoneal surface disease involving the liver.   Electronically Signed   By: PMarijo SanesM.D.   On: 11/15/2014 15:15     ASSESSMENT & PLAN:  Breast cancer of upper-outer quadrant of left female breast Stage III, triple negative, invasive ductal carcinoma of the left breast. She has completed adjuvant radiation with concurrent Xeloda. She is doing well today. She has no sign of recurrence. 3.9 cm size and 9/13 lymph nodes were positive with extracapsular extension. ER/PR negative HER-2 negative Ki-67 21% March 2016:  CT chest revealed bilateral lung nodules biopsy-proven to be triple negative metastatic carcinoma  Shortness of breath: I reviewed the CT scan of the chest done yesterday which did not show any evidence of pulmonary embolism but showed a significant right pleural effusion causing right middle and lower lobe atelectasis. There is also progression of the mediastinal lymphadenopathy. I would like to get her thoracentesis and send the pleural fluid for cytology. We requested radiology evaluation tomorrow.  Metastatic breast cancer: I discussed with her that because it is triple negative there are no targeted therapies for her disease. Based on prior chemotherapy regimens that she had received, I recommended treatment with Halaven days 1 and 8 every 3 weeks. We plan to do 3 cycles of chemotherapy then do a CT scan to assess response followed by 3 more cycles of chemotherapy. I would like to obtain a PET CT scan for staging of her metastatic breast cancer.  Halaven  counseling: I discussed the risks and benefits of chemotherapy with Halaven. The risks of infection, fatigue, nausea, hair loss, neuropathy were all discussed with the patient.  Patient would like to start treatment in 12/06/2014. We will request radiology to insert a port.    Orders Placed This Encounter  Procedures  . US Thoracentesis Asp Pleural space w/IMG guide    Standing Status: Future     Number of Occurrences:      Standing Expiration Date: 11/16/2015    Order Specific Question:  Are labs required for specimen collection?    Answer:  Yes    Order Specific Question:  Lab orders requested (DO NOT place separate lab orders, these will be automatically ordered during procedure specimen collection):    Answer:  Cytology - Non PAP    Order Specific Question:  Lab orders requested (DO NOT place separate lab orders, these will be automatically ordered during procedure specimen collection):    Answer:  Body fluid cell count with differential    Order Specific Question:  Lab orders requested (DO NOT place separate lab orders, these will be automatically ordered during procedure specimen collection):    Answer:  Body fluid culture    Order Specific Question:  Lab orders requested (DO NOT place separate lab orders, these will be automatically ordered during procedure specimen collection):    Answer:  Lactate dehydrogenase, Body fluid    Order Specific Question:  Lab orders requested (DO NOT place separate lab orders, these will be automatically ordered during procedure specimen collection):    Answer:  Glucose, Serous fluid    Order Specific Question:  Reason for Exam (SYMPTOM  OR DIAGNOSIS REQUIRED)    Answer:  Right Pleural effusion with SOB    Order Specific Question:  Preferred imaging location?    Answer:  [facility]  . IR Fluoro Guide CV Line Right    Indicate type of CVC ordering    Standing Status: Future     Number of Occurrences:      Standing Expiration Date:  01/16/2016    Order Specific Question:  Reason for exam:    Answer:  Need for chemo    Order Specific Question:  Is the patient pregnant?    Answer:  No    Order Specific Question:  Preferred Imaging Location?    Answer:  [place] PET Image Initial (PI) Skull Base To Thigh    Standing Status: Future     Number of Occurrences:      Standing Expiration  Date: 11/16/2015    Order Specific Question:  Reason for Exam (SYMPTOM  OR DIAGNOSIS REQUIRED)    Answer:  Metastatic breast cancer    Order Specific Question:  Is the patient pregnant?    Answer:  No    Order Specific Question:  Preferred imaging location?    Answer:  [facility]  . CBC with Differential    Standing Status: Standing     Number of Occurrences: 20     Standing Expiration Date: 11/17/2015  . Comprehensive metabolic panel    Standing Status: Standing     Number of Occurrences: 20     Standing Expiration Date: 11/17/2015  . CBC with Differential [facility])    Standing Status: Standing     Number of Occurrences: 20     Standing Expiration Date: 11/17/2015  . Comprehensive metabolic panel    Standing Status: Standing     Number of Occurrences: 20     Standing Expiration Date: 11/17/2015  . CBC with Differential    Standing Status: Standing     Number of Occurrences: 20     Standing Expiration Date: 11/17/2015  . Comprehensive metabolic panel    Standing Status: Standing     Number of Occurrences: 20     Standing Expiration Date: 11/17/2015   The patient has a good understanding of the overall plan. she agrees with it. She will call with any problems that may develop before her next visit here.   Rulon Eisenmenger, MD

## 2014-11-16 NOTE — Telephone Encounter (Signed)
per pof ot sch pt appt-adv pt that IR & PET sch will call her directly yop sch appt-gave pt copy of sch

## 2014-11-16 NOTE — Assessment & Plan Note (Signed)
Stage III, triple negative, invasive ductal carcinoma of the left breast. She has completed adjuvant radiation with concurrent Xeloda. She is doing well today. She has no sign of recurrence. 3.9 cm size and 9/13 lymph nodes were positive with extracapsular extension. ER/PR negative HER-2 negative Ki-67 21% March 2016: CT chest revealed bilateral lung nodules biopsy-proven to be triple negative metastatic carcinoma  Shortness of breath: I reviewed the CT scan of the chest done yesterday which did not show any evidence of pulmonary embolism but showed a significant right pleural effusion causing right middle and lower lobe atelectasis. There is also progression of the mediastinal lymphadenopathy. I would like to get her thoracentesis and send the pleural fluid for cytology. We requested radiology evaluation tomorrow.  Metastatic breast cancer: I discussed with her that because it is triple negative there are no targeted therapies for her disease. Based on prior chemotherapy regimens that she had received, I recommended treatment with Halaven days 1 and 8 every 3 weeks. We plan to do 3 cycles of chemotherapy then do a CT scan to assess response followed by 3 more cycles of chemotherapy. I would like to obtain a PET CT scan for staging of her metastatic breast cancer.  Patient would like to start treatment in 12/06/2014.

## 2014-11-16 NOTE — Telephone Encounter (Signed)
Per staff message and POF I have scheduled appts. Advised scheduler of appts. JMW  

## 2014-11-17 ENCOUNTER — Ambulatory Visit (HOSPITAL_COMMUNITY)
Admission: RE | Admit: 2014-11-17 | Discharge: 2014-11-17 | Disposition: A | Discharge status: Home / Self Care | Payer: BLUE CROSS/BLUE SHIELD | Source: Ambulatory Visit | Attending: Radiology | Admitting: Radiology

## 2014-11-17 ENCOUNTER — Ambulatory Visit (HOSPITAL_COMMUNITY)
Admission: RE | Admit: 2014-11-17 | Discharge: 2014-11-17 | Disposition: A | Discharge status: Home / Self Care | Payer: BLUE CROSS/BLUE SHIELD | Source: Ambulatory Visit | Attending: Interventional Radiology | Admitting: Interventional Radiology

## 2014-11-17 DIAGNOSIS — C50412 Malignant neoplasm of upper-outer quadrant of left female breast: Secondary | ICD-10-CM | POA: Insufficient documentation

## 2014-11-17 DIAGNOSIS — Z9889 Other specified postprocedural states: Secondary | ICD-10-CM

## 2014-11-17 DIAGNOSIS — J90 Pleural effusion, not elsewhere classified: Secondary | ICD-10-CM | POA: Diagnosis present

## 2014-11-17 LAB — BODY FLUID CELL COUNT WITH DIFFERENTIAL
Lymphs, Fluid: 55 %
Monocyte-Macrophage-Serous Fluid: 36 % — ABNORMAL LOW (ref 50–90)
Neutrophil Count, Fluid: 9 % (ref 0–25)
WBC FLUID: 2123 uL — AB (ref 0–1000)

## 2014-11-17 LAB — LACTATE DEHYDROGENASE, PLEURAL OR PERITONEAL FLUID: LD, Fluid: 755 U/L — ABNORMAL HIGH (ref 3–23)

## 2014-11-17 LAB — GLUCOSE, SEROUS FLUID: Glucose, Fluid: 39 mg/dL

## 2014-11-17 NOTE — Procedures (Signed)
US guided diagnostic/therapeutic right thoracentesis performed yielding 1.3 liters turbid, amber fluid. The fluid was sent to the lab for preordered studies. F/u CXR pending. No immediate complications.

## 2014-11-18 ENCOUNTER — Encounter: Payer: Self-pay | Admitting: Nurse Practitioner

## 2014-11-18 NOTE — Assessment & Plan Note (Signed)
Potassium 3.0 today.  Pt states that she already has potassium capsules at home; and will begin taking again when she returns back home today.

## 2014-11-18 NOTE — Progress Notes (Signed)
SYMPTOM MANAGEMENT CLINIC   HPI: Christine [name] 59 y.o. female diagnosed with breast cancer. Pt is s/p mastectomy, chemotherapy, radiation therapy, and xeloda.  Recent restaging CT revealed bilateral pulmonary nodules and increased mediastinal lymphadenopathy.   Pt c/o increased SOB with any exertion; and increased fatigue.  Denies any recent fever or chills.   HPI  ROS  Past Medical History  Diagnosis Date  . ALLERGIC RHINITIS   . GERD (gastroesophageal reflux disease)   . Hypertension   . Hypothyroidism   . Hx of colonic polyps   . Wears glasses   . Breast cancer 88, age 32    right  . Breast cancer 09/30/12    left, ER/PR -, Her 2 -  . Radiation 09/15/13-11/01/13    Left chest wall/PAB/scar/supraclavicular fossa  . SYNCOPE 05/15/2010    Qualifier: Diagnosis of  By: Arnoldo Morale MD, [place], WITH RADICULOPATHY 10/01/2007    Qualifier: Diagnosis of  By: Arnoldo Morale MD, [place] 05/31/2007    Qualifier: Diagnosis of  By: Arnoldo Morale MD, [place], HX OF 05/31/2007    Qualifier: Diagnosis of  By: Arnoldo Morale MD, [place]   . Lymph edema L arm from breast ca tx 12/22/2013    Past Surgical History  Procedure Laterality Date  . Tonsillectomy    . Caesarean section      x 1  . Mastectomy  04/1987    right side  . Abdominal hysterectomy  06/1988    fibroids  . Portacath placement Right 10/28/2012    Procedure: PORT PLACEMENT;  Surgeon: Joyice Faster. Cornett, MD;  Location: [facility];  Service: General;  Laterality: Right;  Right Subclavian Vein  . Mastectomy modified radical Left 07/19/2013    Procedure: MASTECTOMY MODIFIED RADICAL;  Surgeon: Marcello Moores A. Cornett, MD;  Location: [facility];  Service: General;  Laterality: Left;  . Port-a-cath removal Right 07/19/2013    Procedure: REMOVAL PORT-A-CATH;  Surgeon: Joyice Faster. Cornett, MD;  Location: [facility];  Service: General;  Laterality: Right;  . Breast  surgery Left 07/19/13    MRM    has Hypothyroidism; MORBID OBESITY; ANXIETY DEPRESSION; Essential hypertension; ALLERGIC RHINITIS; GERD; Impaired fasting glucose; Breast cancer of upper-outer quadrant of left female breast; B12 deficiency; Vitamin D deficiency; Lymph edema L arm from breast ca tx; Nausea and vomiting; Lung metastasis; Bilateral breast cancer; Hypokalemia; EKG abnormality; Malignant pleural effusion; and Lymphedema on her problem list.    is allergic to sulfa antibiotics and sulfonamide derivatives.    Medication List       This list is accurate as of: 11/15/14 11:59 PM.  Always use your most recent med list.               alum & mag hydroxide-simeth 200-200-20 MG/5ML suspension  Commonly known as:  MAALOX/MYLANTA  Take 30 mLs by mouth every 6 (six) hours as needed for indigestion or heartburn (dyspepsia).     cholecalciferol 1000 UNITS tablet  Commonly known as:  VITAMIN D  Take 1,000 Units by mouth every morning.     diltiazem 240 MG 24 hr capsule  Commonly known as:  CARDIZEM CD  Take 240 mg by mouth every morning.     esomeprazole 20 MG capsule  Commonly known as:  NEXIUM  Take 20 mg by mouth daily at 12 noon.     fluticasone 50 MCG/ACT nasal spray  Commonly  known as:  FLONASE  Place 2 sprays into both nostrils daily as needed for allergies or rhinitis.     latanoprost 0.005 % ophthalmic solution  Commonly known as:  XALATAN  Place 1 drop into both eyes at bedtime.     levothyroxine 50 MCG tablet  Commonly known as:  SYNTHROID, LEVOTHROID  Take 1 tablet (50 mcg total) by mouth daily.     LORazepam 0.5 MG tablet  Commonly known as:  ATIVAN  Take 1 tablet (0.5 mg total) by mouth every 8 (eight) hours.     losartan-hydrochlorothiazide 50-12.5 MG per tablet  Commonly known as:  HYZAAR  Take 1 tablet by mouth daily.     metFORMIN 500 MG tablet  Commonly known as:  GLUCOPHAGE  Take 2 tablets (1,000 mg total) by mouth 2 (two) times daily with a  meal.     NON FORMULARY  Apply 1 application topically daily as needed (Applies to feet for athlete's foot.). Fungifoam     ondansetron 4 MG tablet  Commonly known as:  ZOFRAN  Take 1 tablet (4 mg total) by mouth every 6 (six) hours as needed for nausea.     oxyCODONE 5 MG immediate release tablet  Commonly known as:  Oxy IR/ROXICODONE  Take 1 tablet (5 mg total) by mouth every 4 (four) hours as needed for moderate pain.         PHYSICAL EXAMINATION  Oncology Vitals 11/16/2014 11/15/2014 11/07/2014 10/31/2014 10/31/2014 10/31/2014 10/31/2014  Height 163 cm - - - - - -  Weight 99.156 kg 99.066 kg 98.884 kg - - - -  Weight (lbs) 218 lbs 10 oz 218 lbs 6 oz 218 lbs - - - -  BMI (kg/m2) 37.52 kg/m2 - - - - - -  Temp 97.9 98.1 98.4 98.2 - - -  Pulse 93 100 94 84 85 92 91  Resp 19 24 - $Re'18 16 12 22  'xJp$ SpO2 93 - - 94 93 90 90  BSA (m2) 2.12 m2 - - - - - -   BP Readings from Last 3 Encounters:  11/17/14 107/58  11/16/14 129/78  11/15/14 120/58    Physical Exam  Constitutional: She is oriented to person, place, and time. Vital signs are normal. She appears unhealthy.  HENT:  Head: Normocephalic and atraumatic.  Mouth/Throat: Oropharynx is clear and moist.  Eyes: Conjunctivae and EOM are normal. Pupils are equal, round, and reactive to light. Right eye exhibits no discharge. Left eye exhibits no discharge. No scleral icterus.  Neck: Normal range of motion. Neck supple. No JVD present. No tracheal deviation present. No thyromegaly present.  Cardiovascular: Normal rate, regular rhythm, normal heart sounds and intact distal pulses.   Pulmonary/Chest: Effort normal and breath sounds normal. No respiratory distress. She has no wheezes. She has no rales. She exhibits no tenderness.  Abdominal: Soft. Bowel sounds are normal. She exhibits no distension and no mass. There is no tenderness. There is no rebound and no guarding.  Musculoskeletal: Normal range of motion. She exhibits edema. She exhibits no  tenderness.  Left arm lymphedema  Lymphadenopathy:    She has no cervical adenopathy.  Neurological: She is alert and oriented to person, place, and time. Gait normal.  Skin: Skin is warm and dry. No rash noted. No erythema. No pallor.  Psychiatric: Affect normal.  Nursing note and vitals reviewed.   LABORATORY DATA:. Appointment on 11/15/2014  Component Date Value Ref Range Status  . WBC 11/15/2014 5.6  3.9 -  10.3 10e3/uL Final  . NEUT# 11/15/2014 3.7  1.5 - 6.5 10e3/uL Final  . HGB 11/15/2014 13.0  11.6 - 15.9 g/dL Final  . HCT 11/15/2014 39.0  34.8 - 46.6 % Final  . Platelets 11/15/2014 270  145 - 400 10e3/uL Final  . MCV 11/15/2014 91.8  79.5 - 101.0 fL Final  . MCH 11/15/2014 30.6  25.1 - 34.0 pg Final  . MCHC 11/15/2014 33.3  31.5 - 36.0 g/dL Final  . RBC 11/15/2014 4.25  3.70 - 5.45 10e6/uL Final  . RDW 11/15/2014 13.4  11.2 - 14.5 % Final  . lymph# 11/15/2014 1.2  0.9 - 3.3 10e3/uL Final  . MONO# 11/15/2014 0.6  0.1 - 0.9 10e3/uL Final  . Eosinophils Absolute 11/15/2014 0.2  0.0 - 0.5 10e3/uL Final  . Basophils Absolute 11/15/2014 0.0  0.0 - 0.1 10e3/uL Final  . NEUT% 11/15/2014 65.6  38.4 - 76.8 % Final  . LYMPH% 11/15/2014 21.7  14.0 - 49.7 % Final  . MONO% 11/15/2014 10.0  0.0 - 14.0 % Final  . EOS% 11/15/2014 2.7  0.0 - 7.0 % Final  . BASO% 11/15/2014 0.0  0.0 - 2.0 % Final  . Sodium 11/15/2014 144  136 - 145 mEq/L Final  . Potassium 11/15/2014 3.0* 3.5 - 5.1 mEq/L Final  . Chloride 11/15/2014 103  98 - 109 mEq/L Final  . CO2 11/15/2014 26  22 - 29 mEq/L Final  . Glucose 11/15/2014 145* 70 - 140 mg/dl Final  . BUN 11/15/2014 15.9  7.0 - 26.0 mg/dL Final  . Creatinine 11/15/2014 0.9  0.6 - 1.1 mg/dL Final  . Total Bilirubin 11/15/2014 0.51  0.20 - 1.20 mg/dL Final  . Alkaline Phosphatase 11/15/2014 83  40 - 150 U/L Final  . AST 11/15/2014 26  5 - 34 U/L Final  . ALT 11/15/2014 19  0 - 55 U/L Final  . Total Protein 11/15/2014 7.3  6.4 - 8.3 g/dL Final  . Albumin  11/15/2014 3.9  3.5 - 5.0 g/dL Final  . Calcium 11/15/2014 9.3  8.4 - 10.4 mg/dL Final  . Anion Gap 11/15/2014 16* 3 - 11 mEq/L Final  . EGFR 11/15/2014 86* >90 ml/min/1.73 m2 Final   eGFR is calculated using the CKD-EPI Creatinine Equation (2009)     RADIOGRAPHIC STUDIES: Dg Chest 1 View  11/17/2014   CLINICAL DATA:  Status post right thoracentesis. Metastatic breast carcinoma.  EXAM: CHEST  1 VIEW  COMPARISON:  11/15/2014  FINDINGS: Small right pleural effusion is significantly decreased in size since previous study. Decreased atelectasis also noted in right lung base. No pneumothorax visualized.  Bilateral pulmonary metastases again noted. Heart size remains within normal limits. Surgical clips again seen in both axillary regions.  IMPRESSION: Significant decrease in size of right pleural effusion and basilar atelectasis status post thoracentesis. No pneumothorax visualized.  Diffuse bilateral pulmonary metastases again noted.   Electronically Signed   By: Earle Gell M.D.   On: 11/17/2014 11:23   Dg Chest 2 View  11/15/2014   CLINICAL DATA:  Bilateral breast cancer 10/31/2014  EXAM: CHEST  2 VIEW  COMPARISON:  10/31/2014  FINDINGS: Cardiomediastinal silhouette is stable. Surgical clips are noted bilateral axilla. Bilateral pulmonary nodules are noted consistent with metastatic disease. There is small right pleural effusion with right basilar atelectasis or infiltrate.  IMPRESSION: Bilateral pulmonary nodules again noted. Small right pleural effusion with right basilar atelectasis or infiltrate.   Electronically Signed   By: Orlean Bradford.D.  On: 11/15/2014 10:42   Ct Angio Chest Pe W/cm &/or Wo Cm  11/15/2014   CLINICAL DATA:  Two week history of shortness of breath. Known metastatic breast cancer.  EXAM: CT ANGIOGRAPHY CHEST WITH CONTRAST  TECHNIQUE: Multidetector CT imaging of the chest was performed using the standard protocol during bolus administration of intravenous contrast. Multiplanar CT  image reconstructions and MIPs were obtained to evaluate the vascular anatomy.  CONTRAST:  70mL OMNIPAQUE IOHEXOL 350 MG/ML SOLN  COMPARISON:  10/30/2014  FINDINGS: Chest wall: Stable surgical changes related to bilateral mastectomies. There is a large stable postoperative fluid collection involving the left lower chest wall. Persistent skin thickening without obvious mass. No axillary or supraclavicular adenopathy. The bony thorax is intact. No destructive bone lesions or spinal canal compromise. No obvious bony metastasis.  Mediastinum: The heart is normal in size. No pericardial effusion. Interval progression of right-sided mediastinal and hilar adenopathy. The aorta is normal in caliber. No dissection. The esophagus is grossly normal.  The pulmonary arterial tree is fairly well opacified. No definite filling defects to suggest pulmonary embolism.  Lungs/ pleura: Enlarging right-sided pleural effusion without obvious pleural nodules. There is progressive right middle lobe and right lower lobe compressive atelectasis and a enlarging perifissural fluid collection in the right lower lobe. Knee metastatic pulmonary nodules appear relatively stable. The large right lower lobe mass is partially obscured by fluid and atelectasis.  Upper abdomen: The posterior margin of the right hepatic lobe is very irregular. Could not exclude peritoneal surface disease possibly extending down from the chest. No intrahepatic lesions are identified.  Review of the MIP images confirms the above findings.  IMPRESSION: 1. No CT findings for pulmonary embolism. 2. Enlarging right pleural effusion with progressive overlying atelectasis. 3. Progression of mediastinal and right hilar adenopathy. 4. Relatively stable pulmonary metastatic disease. 5. Possible peritoneal surface disease involving the liver.   Electronically Signed   By: Marijo Sanes M.D.   On: 11/15/2014 15:15   US Thoracentesis Asp Pleural Space W/img Guide  11/17/2014    INDICATION: Breast cancer, dyspnea, right pleural effusion; request is made for diagnostic and therapeutic right thoracentesis.  EXAM: ULTRASOUND GUIDED DIAGNOSTIC AND THERAPEUTIC RIGHT THORACENTESIS  COMPARISON:  None.  MEDICATIONS: None  COMPLICATIONS: None immediate  TECHNIQUE: Informed written consent was obtained from the patient after a discussion of the risks, benefits and alternatives to treatment. A timeout was performed prior to the initiation of the procedure.  Initial ultrasound scanning demonstrates a moderate to large right pleural effusion. The lower chest was prepped and draped in the usual sterile fashion. 1% lidocaine was used for local anesthesia.  An ultrasound image was saved for documentation purposes. A 6 Fr Safe-T-Centesis catheter was introduced. The thoracentesis was performed. The catheter was removed and a dressing was applied. The patient tolerated the procedure well without immediate post procedural complication. The patient was escorted to have an upright chest radiograph.  FINDINGS: A total of approximately 1.3 liters of turbid, amber fluid was removed. Requested samples were sent to the laboratory.  IMPRESSION: Successful ultrasound-guided diagnostic and therapeutic right sided thoracentesis yielding 1.3 liters of pleural fluid.  Read by: Rowe Robert, PA-C   Electronically Signed   By: Sandi Mariscal M.D.   On: 11/17/2014 11:13    ASSESSMENT/PLAN:    Breast cancer of upper-outer quadrant of left female breast Pt is s/p xeloda and radiation therapy.  Recent restaging CT revealed bilat lung nodules.  Pt now c/o increased SOB and fatigue.  Obtained CT angiogram today which revealed increased right pleural effusion and increased mediastinal lymphadenopathy.   Have arranged for pt to meet with Dr. Lindi Adie tomorrow for further eval and to discuss treatment options.     Hypokalemia Potassium 3.0 today.  Pt states that she already has potassium capsules at home; and will begin  taking again when she returns back home today.    Malignant pleural effusion CT angiogram obtained today revealed increasing right pleural effusion.  O2 sats remain stable; and pt with only mild SOB with exertion on exam.    Advised pt to return to cancer center tomorrow for follow up visit with Dr. Lindi Adie to discuss treatment options.    Lymphedema Left arm lymphedema continues.  Pt states that she has a compression sleeve at home; but typically never wears it.  Advised that she start wearing sleeve again to arm.    Patient stated understanding of all instructions; and was in agreement with this plan of care. The patient knows to call the clinic with any problems, questions or concerns.   Review/collaboration with Dr. Lindi Adie regarding all aspects of patient's visit today.   Total time spent with patient was 40 minutes;  with greater than 75 percent of that time spent in face to face counseling regarding patient's symptoms,  and coordination of care and follow up.  Disclaimer: This note was dictated with voice recognition software. Similar sounding words can inadvertently be transcribed and may not be corrected upon review.   Drue Second, NP 11/18/2014

## 2014-11-18 NOTE — Assessment & Plan Note (Signed)
CT angiogram obtained today revealed increasing right pleural effusion.  O2 sats remain stable; and pt with only mild SOB with exertion on exam.    Advised pt to return to cancer center tomorrow for follow up visit with Dr. Lindi Adie to discuss treatment options.

## 2014-11-18 NOTE — Assessment & Plan Note (Signed)
Left arm lymphedema continues.  Pt states that she has a compression sleeve at home; but typically never wears it.  Advised that she start wearing sleeve again to arm.

## 2014-11-18 NOTE — Assessment & Plan Note (Signed)
Pt is s/p xeloda and radiation therapy.  Recent restaging CT revealed bilat lung nodules.  Pt now c/o increased SOB and fatigue.   Obtained CT angiogram today which revealed increased right pleural effusion and increased mediastinal lymphadenopathy.   Have arranged for pt to meet with Dr. Lindi Adie tomorrow for further eval and to discuss treatment options.

## 2014-11-20 LAB — BODY FLUID CULTURE: Culture: NO GROWTH

## 2014-11-21 ENCOUNTER — Other Ambulatory Visit: Payer: BLUE CROSS/BLUE SHIELD

## 2014-11-21 ENCOUNTER — Telehealth: Payer: Self-pay | Admitting: *Deleted

## 2014-11-21 ENCOUNTER — Encounter: Payer: Self-pay | Admitting: *Deleted

## 2014-11-21 NOTE — Telephone Encounter (Signed)
No additional note

## 2014-11-21 NOTE — Progress Notes (Signed)
Patient thought she was having port placed today.   This nurse called [facility] at [facility].  Spoke with [place].   Port is for Thursday, 4/21, not today.  Called patient back and made her aware.

## 2014-11-22 ENCOUNTER — Other Ambulatory Visit: Payer: Self-pay | Admitting: Radiology

## 2014-11-23 ENCOUNTER — Ambulatory Visit (HOSPITAL_COMMUNITY)
Admission: RE | Admit: 2014-11-23 | Discharge: 2014-11-23 | Disposition: A | Discharge status: Home / Self Care | Payer: BLUE CROSS/BLUE SHIELD | Source: Ambulatory Visit | Attending: Hematology and Oncology | Admitting: Hematology and Oncology

## 2014-11-23 ENCOUNTER — Encounter (HOSPITAL_COMMUNITY): Payer: Self-pay

## 2014-11-23 ENCOUNTER — Ambulatory Visit: Payer: BLUE CROSS/BLUE SHIELD

## 2014-11-23 ENCOUNTER — Other Ambulatory Visit: Payer: Self-pay | Admitting: Hematology and Oncology

## 2014-11-23 ENCOUNTER — Ambulatory Visit (HOSPITAL_COMMUNITY)
Admission: RE | Admit: 2014-11-23 | Discharge: 2014-11-23 | Disposition: A | Discharge status: Home / Self Care | Payer: BLUE CROSS/BLUE SHIELD | Source: Ambulatory Visit | Attending: Interventional Radiology | Admitting: Interventional Radiology

## 2014-11-23 DIAGNOSIS — J90 Pleural effusion, not elsewhere classified: Secondary | ICD-10-CM | POA: Insufficient documentation

## 2014-11-23 DIAGNOSIS — K219 Gastro-esophageal reflux disease without esophagitis: Secondary | ICD-10-CM | POA: Diagnosis not present

## 2014-11-23 DIAGNOSIS — Z79899 Other long term (current) drug therapy: Secondary | ICD-10-CM | POA: Diagnosis not present

## 2014-11-23 DIAGNOSIS — C50412 Malignant neoplasm of upper-outer quadrant of left female breast: Secondary | ICD-10-CM

## 2014-11-23 DIAGNOSIS — J309 Allergic rhinitis, unspecified: Secondary | ICD-10-CM | POA: Insufficient documentation

## 2014-11-23 DIAGNOSIS — I10 Essential (primary) hypertension: Secondary | ICD-10-CM | POA: Insufficient documentation

## 2014-11-23 DIAGNOSIS — E039 Hypothyroidism, unspecified: Secondary | ICD-10-CM | POA: Diagnosis not present

## 2014-11-23 DIAGNOSIS — C799 Secondary malignant neoplasm of unspecified site: Secondary | ICD-10-CM | POA: Insufficient documentation

## 2014-11-23 DIAGNOSIS — Z90710 Acquired absence of both cervix and uterus: Secondary | ICD-10-CM | POA: Diagnosis not present

## 2014-11-23 DIAGNOSIS — Z9013 Acquired absence of bilateral breasts and nipples: Secondary | ICD-10-CM | POA: Insufficient documentation

## 2014-11-23 LAB — CBC
HCT: 38.1 % (ref 36.0–46.0)
Hemoglobin: 12.4 g/dL (ref 12.0–15.0)
MCH: 30.2 pg (ref 26.0–34.0)
MCHC: 32.5 g/dL (ref 30.0–36.0)
MCV: 92.9 fL (ref 78.0–100.0)
Platelets: 289 10*3/uL (ref 150–400)
RBC: 4.1 MIL/uL (ref 3.87–5.11)
RDW: 13.2 % (ref 11.5–15.5)
WBC: 5.2 10*3/uL (ref 4.0–10.5)

## 2014-11-23 LAB — PROTIME-INR
INR: 0.96 (ref 0.00–1.49)
PROTHROMBIN TIME: 12.9 s (ref 11.6–15.2)

## 2014-11-23 LAB — BASIC METABOLIC PANEL
Anion gap: 15 (ref 5–15)
BUN: 17 mg/dL (ref 6–23)
CALCIUM: 9.5 mg/dL (ref 8.4–10.5)
CO2: 23 mmol/L (ref 19–32)
Chloride: 103 mmol/L (ref 96–112)
Creatinine, Ser: 1.18 mg/dL — ABNORMAL HIGH (ref 0.50–1.10)
GFR calc Af Amer: 58 mL/min — ABNORMAL LOW (ref >90)
GFR calc non Af Amer: 50 mL/min — ABNORMAL LOW (ref >90)
GLUCOSE: 104 mg/dL — AB (ref 70–99)
Potassium: 3.7 mmol/L (ref 3.5–5.1)
Sodium: 141 mmol/L (ref 135–145)

## 2014-11-23 LAB — APTT: aPTT: 31 seconds (ref 24–37)

## 2014-11-23 LAB — GLUCOSE, CAPILLARY: GLUCOSE-CAPILLARY: 98 mg/dL (ref 70–99)

## 2014-11-23 MED ORDER — FENTANYL CITRATE (PF) 100 MCG/2ML IJ SOLN
INTRAMUSCULAR | Status: AC
  Filled 2014-11-23: qty 4

## 2014-11-23 MED ORDER — MIDAZOLAM HCL 2 MG/2ML IJ SOLN
INTRAMUSCULAR | Status: AC
  Filled 2014-11-23: qty 4

## 2014-11-23 MED ORDER — HEPARIN SOD (PORK) LOCK FLUSH 100 UNIT/ML IV SOLN
INTRAVENOUS | Status: AC
  Administered 2014-11-23: 15:00:00
  Filled 2014-11-23: qty 5

## 2014-11-23 MED ORDER — LIDOCAINE-EPINEPHRINE 2 %-1:100000 IJ SOLN
INTRAMUSCULAR | Status: AC
  Filled 2014-11-23: qty 1

## 2014-11-23 MED ORDER — SODIUM CHLORIDE 0.9 % IV SOLN
Freq: Once | INTRAVENOUS | Status: AC
  Administered 2014-11-23: 13:00:00 via INTRAVENOUS

## 2014-11-23 MED ORDER — CEFAZOLIN SODIUM-DEXTROSE 2-3 GM-% IV SOLR: 2.0000 g | Freq: Once | INTRAVENOUS | Status: DC

## 2014-11-23 MED ORDER — FENTANYL CITRATE (PF) 100 MCG/2ML IJ SOLN
INTRAMUSCULAR | Status: AC | PRN
  Administered 2014-11-23: 50 ug via INTRAVENOUS
  Administered 2014-11-23 (×2): 25 ug via INTRAVENOUS

## 2014-11-23 MED ORDER — CEFAZOLIN SODIUM-DEXTROSE 2-3 GM-% IV SOLR
INTRAVENOUS | Status: AC
  Filled 2014-11-23: qty 50

## 2014-11-23 MED ORDER — LIDOCAINE HCL 1 % IJ SOLN
INTRAMUSCULAR | Status: AC
  Filled 2014-11-23: qty 20

## 2014-11-23 MED ORDER — MIDAZOLAM HCL 2 MG/2ML IJ SOLN
INTRAMUSCULAR | Status: AC | PRN
  Administered 2014-11-23 (×2): 0.5 mg via INTRAVENOUS
  Administered 2014-11-23: 1 mg via INTRAVENOUS

## 2014-11-23 NOTE — Procedures (Signed)
R IJ Port cathter placement with US and fluoroscopy No complication No blood loss. See complete dictation in [facility] PACS.  

## 2014-11-23 NOTE — Discharge Instructions (Signed)
Implanted Port Insertion, Care After °Refer to this sheet in the next few weeks. These instructions provide you with information on caring for yourself after your procedure. Your health care provider may also give you more specific instructions. Your treatment has been planned according to current medical practices, but problems sometimes occur. Call your health care provider if you have any problems or questions after your procedure. °WHAT TO EXPECT AFTER THE PROCEDURE °After your procedure, it is typical to have the following:  °· Discomfort at the port insertion site. Ice packs to the area will help. °· Bruising on the skin over the port. This will subside in 3-4 days. °HOME CARE INSTRUCTIONS °· After your port is placed, you will get a manufacturer's information card. The card has information about your port. Keep this card with you at all times.   °· Know what kind of port you have. There are many types of ports available.   °· Wear a medical alert bracelet in case of an emergency. This can help alert health care workers that you have a port.   °· The port can stay in for as long as your health care provider believes it is necessary.   °· A home health care nurse may give medicines and take care of the port.   °· You or a family member can get special training and directions for giving medicine and taking care of the port at home.   °SEEK MEDICAL CARE IF:  °· Your port does not flush or you are unable to get a blood return.   °· You have a fever or chills. °SEEK IMMEDIATE MEDICAL CARE IF: °· You have new fluid or pus coming from your incision.   °· You notice a bad smell coming from your incision site.   °· You have swelling, pain, or more redness at the incision or port site.   °· You have chest pain or shortness of breath. °Document Released: 05/11/2013 Document Revised: 07/26/2013 Document Reviewed: 05/11/2013 °Exit[employer]® Patient Information ©2015 [employer]. This information is not intended to replace  advice given to you by your health care provider. Make sure you discuss any questions you have with your health care provider. °Conscious Sedation, Adult, Care After °Refer to this sheet in the next few weeks. These instructions provide you with information on caring for yourself after your procedure. Your health care provider may also give you more specific instructions. Your treatment has been planned according to current medical practices, but problems sometimes occur. Call your health care provider if you have any problems or questions after your procedure. °WHAT TO EXPECT AFTER THE PROCEDURE  °After your procedure: °· You may feel sleepy, clumsy, and have poor balance for several hours. °· Vomiting may occur if you eat too soon after the procedure. °HOME CARE INSTRUCTIONS °· Do not participate in any activities where you could become injured for at least 24 hours. Do not: °¨ Drive. °¨ Swim. °¨ Ride a bicycle. °¨ Operate heavy machinery. °¨ Cook. °¨ Use power tools. °¨ Climb ladders. °¨ Work from a high place. °· Do not make important decisions or sign legal documents until you are improved. °· If you vomit, drink water, juice, or soup when you can drink without vomiting. Make sure you have little or no nausea before eating solid foods. °· Only take over-the-counter or prescription medicines for pain, discomfort, or fever as directed by your health care provider. °· Make sure you and your family fully understand everything about the medicines given to you, including what side effects   may occur. °· You should not drink alcohol, take sleeping pills, or take medicines that cause drowsiness for at least 24 hours. °· If you smoke, do not smoke without supervision. °· If you are feeling better, you may resume normal activities 24 hours after you were sedated. °· Keep all appointments with your health care provider. °SEEK MEDICAL CARE IF: °· Your skin is pale or bluish in color. °· You continue to feel nauseous or  vomit. °· Your pain is getting worse and is not helped by medicine. °· You have bleeding or swelling. °· You are still sleepy or feeling clumsy after 24 hours. °SEEK IMMEDIATE MEDICAL CARE IF: °· You develop a rash. °· You have difficulty breathing. °· You develop any type of allergic problem. °· You have a fever. °MAKE SURE YOU: °· Understand these instructions. °· Will watch your condition. °· Will get help right away if you are not doing well or get worse. °Document Released: 05/11/2013 Document Reviewed: 05/11/2013 °ExitCare® Patient Information ©2015 [employer]. This information is not intended to replace advice given to you by your health care provider. Make sure you discuss any questions you have with your health care provider. ° ° °

## 2014-11-23 NOTE — H&P (Signed)
Chief Complaint: "I'm getting another port a cath"  Referring Physician(s): Gudena,Vinay  History of Present Illness: Christine [name] is a 59 y.o. female with remote history of right breast cancer in 1988 with mastectomy but no adjuvant treatment, left breast cancer diagnosed in 2014 with mastectomy/chemoradiation/xeloda ; prior right subclavian Port-A-Cath in 2014 subsequently removed secondary to poor function. Now with evidence of disease progression on recent imaging. Request is  made for Port-A-Cath placement for chemotherapy.   Past Medical History  Diagnosis Date  . ALLERGIC RHINITIS   . GERD (gastroesophageal reflux disease)   . Hypertension   . Hypothyroidism   . Hx of colonic polyps   . Wears glasses   . Breast cancer 80, age 35    right  . Breast cancer 09/30/12    left, ER/PR -, Her 2 -  . Radiation 09/15/13-11/01/13    Left chest wall/PAB/scar/supraclavicular fossa  . SYNCOPE 05/15/2010    Qualifier: Diagnosis of  By: Arnoldo Morale MD, [place], WITH RADICULOPATHY 10/01/2007    Qualifier: Diagnosis of  By: Arnoldo Morale MD, [place] 05/31/2007    Qualifier: Diagnosis of  By: Arnoldo Morale MD, [place], HX OF 05/31/2007    Qualifier: Diagnosis of  By: Arnoldo Morale MD, Balinda Quails   . Lymph edema L arm from breast ca tx 12/22/2013    Past Surgical History  Procedure Laterality Date  . Tonsillectomy    . Caesarean section      x 1  . Mastectomy  04/1987    right side  . Abdominal hysterectomy  06/1988    fibroids  . Portacath placement Right 10/28/2012    Procedure: PORT PLACEMENT;  Surgeon: Joyice Faster. Cornett, MD;  Location: [place];  Service: General;  Laterality: Right;  Right Subclavian Vein  . Mastectomy modified radical Left 07/19/2013    Procedure: MASTECTOMY MODIFIED RADICAL;  Surgeon: Marcello Moores A. Cornett, MD;  Location: [place];  Service: General;  Laterality: Left;  . Port-a-cath removal Right  07/19/2013    Procedure: REMOVAL PORT-A-CATH;  Surgeon: Joyice Faster. Cornett, MD;  Location: [place];  Service: General;  Laterality: Right;  . Breast surgery Left 07/19/13    MRM    Allergies: Sulfa antibiotics and Sulfonamide derivatives  Medications: Prior to Admission medications   Medication Sig Start Date End Date Taking? Authorizing Provider  cetirizine (ZYRTEC) 10 MG tablet Take 10 mg by mouth daily.   Yes Historical Provider, MD  Christiane Ha Oil (CHIA SEED OIL EXTRACT PO) Take 1 each by mouth 4 (four) times a week.   Yes Historical Provider, MD  cholecalciferol (VITAMIN D) 1000 UNITS tablet Take 1,000 Units by mouth every morning.    Yes Historical Provider, MD  diltiazem (CARDIZEM CD) 240 MG 24 hr capsule Take 240 mg by mouth every morning.    Yes Historical Provider, MD  esomeprazole (NEXIUM) 20 MG capsule Take 20 mg by mouth daily at 12 noon.   Yes Historical Provider, MD  Flaxseed, Linseed, (FLAX SEEDS PO) Take 1 each by mouth 4 (four) times a week.   Yes Historical Provider, MD  fluticasone (FLONASE) 50 MCG/ACT nasal spray Place 2 sprays into both nostrils daily as needed for allergies or rhinitis.   Yes Historical Provider, MD  latanoprost (XALATAN) 0.005 % ophthalmic solution Place 1 drop into both eyes at bedtime.    Yes Historical Provider, MD  levothyroxine (  SYNTHROID, LEVOTHROID) 50 MCG tablet Take 1 tablet (50 mcg total) by mouth daily. 11/07/14  Yes Lucretia Kern, DO  lidocaine-prilocaine (EMLA) cream Apply to affected area once 11/16/14  Yes Nicholas Lose, MD  LORazepam (ATIVAN) 0.5 MG tablet Take 1 tablet (0.5 mg total) by mouth every 6 (six) hours as needed (Nausea or vomiting). 11/16/14  Yes Nicholas Lose, MD  losartan-hydrochlorothiazide (HYZAAR) 50-12.5 MG per tablet Take 1 tablet by mouth daily. 09/22/14  Yes Lucretia Kern, DO  metFORMIN (GLUCOPHAGE) 500 MG tablet Take 2 tablets (1,000 mg total) by mouth 2 (two) times daily with a meal. 11/07/14  Yes Lucretia Kern, DO   naproxen sodium (ANAPROX) 220 MG tablet Take 440 mg by mouth daily as needed (pain).   Yes Historical Provider, MD  NON FORMULARY Apply 1 application topically daily as needed (Applies to feet for athlete's foot.). Fungifoam   Yes Historical Provider, MD  ondansetron (ZOFRAN) 8 MG tablet Take 1 tablet (8 mg total) by mouth 2 (two) times daily. Start the day after chemo for 2 days. Then take as needed for nausea or vomiting. 11/16/14  Yes Nicholas Lose, MD  potassium chloride (KLOR-CON) 20 MEQ packet Take 20 mEq by mouth 2 (two) times daily.   Yes Historical Provider, MD  prochlorperazine (COMPAZINE) 10 MG tablet Take 1 tablet (10 mg total) by mouth every 6 (six) hours as needed (Nausea or vomiting). 11/16/14  Yes Nicholas Lose, MD  Tetrahydrozoline HCl (VISINE OP) Apply 1-2 drops to eye daily as needed (itchy eyes.).   Yes Historical Provider, MD  alum & mag hydroxide-simeth (MAALOX/MYLANTA) 200-200-20 MG/5ML suspension Take 30 mLs by mouth every 6 (six) hours as needed for indigestion or heartburn (dyspepsia). Patient not taking: Reported on 11/17/2014 10/31/14   Robbie Lis, MD  ondansetron (ZOFRAN) 4 MG tablet Take 1 tablet (4 mg total) by mouth every 6 (six) hours as needed for nausea. Patient not taking: Reported on 11/17/2014 10/31/14   Robbie Lis, MD  oxyCODONE (OXY IR/ROXICODONE) 5 MG immediate release tablet Take 1 tablet (5 mg total) by mouth every 4 (four) hours as needed for moderate pain. Patient not taking: Reported on 11/17/2014 10/31/14   Robbie Lis, MD    Family History  Problem Relation Age of Onset  . Colon cancer Mother 53    family hx of colon ca 1st degree relative <60  . Coronary artery disease Father     family hx of CAD female 1st degree relative <50  . Stomach cancer Neg Hx   . Rectal cancer Neg Hx   . Esophageal cancer Neg Hx   . Breast cancer Maternal Grandmother 80  . Diabetes Paternal Grandmother   . Breast cancer Cousin 57    maternal cousin  . Coronary artery  disease Maternal Grandfather   . Breast cancer Sister     paternal half sister; died in her 65s    History   Social History  . Marital Status: Married    Spouse Name: Donnie  . Number of Children: 1  . Years of Education: N/A   Occupational History  . Unemployed    Social History Main Topics  . Smoking status: Never Smoker   . Smokeless tobacco: Never Used  . Alcohol Use: No  . Drug Use: No  . Sexual Activity: Not Currently     Comment: menarche 56, 1st pregnancy age 19-miscarr, age 65 1st live birth, menop 61   Other Topics Concern  .  None   Social History Narrative   Married. Lives w/ husband.  Has not worked since being diagnosed with cancer 2 years ago.  Previously worked as a Solicitor.  Independent with ADLs.      Spiritual Beliefs:Christian      Lifestyle: started the [employer] exercise program for cancer survivors (12/2013); working on diet               Review of Systems  Constitutional: Negative for fever and chills.  Respiratory:       Occasional cough and some dyspnea with exertion  Cardiovascular: Negative for chest pain.  Gastrointestinal: Negative for nausea, vomiting, abdominal pain and blood in stool.  Genitourinary: Negative for dysuria and hematuria.  Musculoskeletal: Negative for back pain.  Neurological: Negative for headaches.    Vital Signs: BP 117/69 mmHg  Pulse 101  Temp(Src) 98 F (36.7 C) (Oral)  Ht _0  (1.626 m)  Wt 218 lb (98.884 kg)  BMI 37.40 kg/m2  SpO2 95%  Physical Exam  Constitutional: She is oriented to person, place, and time. She appears well-developed and well-nourished.  Cardiovascular: Normal rate and regular rhythm.   Pulmonary/Chest: Effort normal.  Diminished breath sounds right base, left clear  Abdominal: Soft. Bowel sounds are normal. There is no tenderness.  Obese  Musculoskeletal: Normal range of motion.  Neurological: She is alert and oriented to person, place, and time.    Imaging: Dg  Chest 1 View  11/17/2014   CLINICAL DATA:  Status post right thoracentesis. Metastatic breast carcinoma.  EXAM: CHEST  1 VIEW  COMPARISON:  11/15/2014  FINDINGS: Small right pleural effusion is significantly decreased in size since previous study. Decreased atelectasis also noted in right lung base. No pneumothorax visualized.  Bilateral pulmonary metastases again noted. Heart size remains within normal limits. Surgical clips again seen in both axillary regions.  IMPRESSION: Significant decrease in size of right pleural effusion and basilar atelectasis status post thoracentesis. No pneumothorax visualized.  Diffuse bilateral pulmonary metastases again noted.   Electronically Signed   By: Earle Gell M.D.   On: 11/17/2014 11:23   Dg Chest 2 View  11/15/2014   CLINICAL DATA:  Bilateral breast cancer 10/31/2014  EXAM: CHEST  2 VIEW  COMPARISON:  10/31/2014  FINDINGS: Cardiomediastinal silhouette is stable. Surgical clips are noted bilateral axilla. Bilateral pulmonary nodules are noted consistent with metastatic disease. There is small right pleural effusion with right basilar atelectasis or infiltrate.  IMPRESSION: Bilateral pulmonary nodules again noted. Small right pleural effusion with right basilar atelectasis or infiltrate.   Electronically Signed   By: Lahoma Crocker M.D.   On: 11/15/2014 10:42   Ct Chest W Contrast  10/30/2014   CLINICAL DATA:  Patient with nausea and vomiting. Chest radiograph demonstrating pulmonary nodules. History of right mastectomy.  EXAM: CT CHEST, ABDOMEN, AND PELVIS WITH CONTRAST  TECHNIQUE: Multidetector CT imaging of the chest, abdomen and pelvis was performed following the standard protocol during bolus administration of intravenous contrast.  CONTRAST:  165m OMNIPAQUE IOHEXOL 300 MG/ML  SOLN  COMPARISON:  CT 10/27/2012  FINDINGS: CT CHEST FINDINGS  Mediastinum/Nodes: Visualized thyroid is unremarkable. Status post bilateral axillary node dissection. There is a 1.6 cm right  hilar lymph node (image 24; series 2). There is a 1.2 cm subcarinal lymph node (image 24; series 2). Additional 10 mm right hilar lymph node (image 27; series 2). Normal heart size. Trace fluid within the superior pericardial recess.  Lungs/Pleura: Central airways are patent. Interval  development of multiple bilateral pulmonary nodules and right lower lobe mass. Right lower lobe mass measures 3.2 x 2.9 cm (image 35; series 4). Reference right upper lobe nodule measures 10 mm (image 17; series 4). Reference left lower lobe nodule measures 1.3 cm (image 32; series 4). New moderate right pleural effusion. Subpleural consolidative opacities within the right lower lobe.  Musculoskeletal: Postoperative changes compatible with right mastectomy. Additionally there are postoperative changes compatible with interval left mastectomy. There is soft tissue thickening and irregularity at the left mastectomy site (image 32; series 2). Additionally there is a 15 x 0 3 cm fluid collection within the left anterior chest wall soft tissues at the level of the mastectomy site. The left pectoralis musculature is thickened and irregular in appearance.  CT ABDOMEN AND PELVIS FINDINGS  Hepatobiliary: Liver is normal in size and contour. There is a 0.8 cm low-attenuation lesion within the right hepatic lobe (image 43; series 2). There is a large stone within the gallbladder. No gallbladder wall thickening. No intrahepatic or extrahepatic biliary ductal dilatation.  Pancreas: Unremarkable  Spleen: Unremarkable  Adrenals/Urinary Tract: Normal adrenal glands. Kidneys enhance symmetrically with contrast. No hydronephrosis.  Stomach/Bowel: No abnormal bowel wall thickening or evidence for bowel obstruction. The appendix is normal. Diastases of the rectus abdominus musculature.  Vascular/Lymphatic: Normal caliber abdominal aorta. No retroperitoneal lymphadenopathy.  Other: Status post hysterectomy.  Adnexal structures unremarkable.   Musculoskeletal: No aggressive or acute appearing osseous lesions.  IMPRESSION: 1. Interval development of multiple bilateral pulmonary nodules compatible with metastatic disease. New moderate right pleural effusion potentially secondary to malignant effusion. 2. Interval development of mediastinal and right hilar adenopathy concerning for metastatic disease. 3. Small low-attenuation lesion within the liver, metastatic disease not excluded. 4. Postoperative changes compatible with interval left mastectomy. There is overlying skin thickening and nodularity of the mastectomy site. Additionally there is irregularity and thickening of the adjacent pectoralis musculature. Localized recurrence is not excluded. 5. Large 15 cm fluid collection at the postoperative site within the anterior left chest wall which may represent seroma.   Electronically Signed   By: Lovey Newcomer M.D.   On: 10/30/2014 16:03   Ct Angio Chest Pe W/cm &/or Wo Cm  11/15/2014   CLINICAL DATA:  Two week history of shortness of breath. Known metastatic breast cancer.  EXAM: CT ANGIOGRAPHY CHEST WITH CONTRAST  TECHNIQUE: Multidetector CT imaging of the chest was performed using the standard protocol during bolus administration of intravenous contrast. Multiplanar CT image reconstructions and MIPs were obtained to evaluate the vascular anatomy.  CONTRAST:  89m OMNIPAQUE IOHEXOL 350 MG/ML SOLN  COMPARISON:  10/30/2014  FINDINGS: Chest wall: Stable surgical changes related to bilateral mastectomies. There is a large stable postoperative fluid collection involving the left lower chest wall. Persistent skin thickening without obvious mass. No axillary or supraclavicular adenopathy. The bony thorax is intact. No destructive bone lesions or spinal canal compromise. No obvious bony metastasis.  Mediastinum: The heart is normal in size. No pericardial effusion. Interval progression of right-sided mediastinal and hilar adenopathy. The aorta is normal in  caliber. No dissection. The esophagus is grossly normal.  The pulmonary arterial tree is fairly well opacified. No definite filling defects to suggest pulmonary embolism.  Lungs/ pleura: Enlarging right-sided pleural effusion without obvious pleural nodules. There is progressive right middle lobe and right lower lobe compressive atelectasis and a enlarging perifissural fluid collection in the right lower lobe. Knee metastatic pulmonary nodules appear relatively stable. The large right lower lobe mass  is partially obscured by fluid and atelectasis.  Upper abdomen: The posterior margin of the right hepatic lobe is very irregular. Could not exclude peritoneal surface disease possibly extending down from the chest. No intrahepatic lesions are identified.  Review of the MIP images confirms the above findings.  IMPRESSION: 1. No CT findings for pulmonary embolism. 2. Enlarging right pleural effusion with progressive overlying atelectasis. 3. Progression of mediastinal and right hilar adenopathy. 4. Relatively stable pulmonary metastatic disease. 5. Possible peritoneal surface disease involving the liver.   Electronically Signed   By: Marijo Sanes M.D.   On: 11/15/2014 15:15   Ct Abdomen Pelvis W Contrast  10/30/2014   CLINICAL DATA:  Patient with nausea and vomiting. Chest radiograph demonstrating pulmonary nodules. History of right mastectomy.  EXAM: CT CHEST, ABDOMEN, AND PELVIS WITH CONTRAST  TECHNIQUE: Multidetector CT imaging of the chest, abdomen and pelvis was performed following the standard protocol during bolus administration of intravenous contrast.  CONTRAST:  138m OMNIPAQUE IOHEXOL 300 MG/ML  SOLN  COMPARISON:  CT 10/27/2012  FINDINGS: CT CHEST FINDINGS  Mediastinum/Nodes: Visualized thyroid is unremarkable. Status post bilateral axillary node dissection. There is a 1.6 cm right hilar lymph node (image 24; series 2). There is a 1.2 cm subcarinal lymph node (image 24; series 2). Additional 10 mm right  hilar lymph node (image 27; series 2). Normal heart size. Trace fluid within the superior pericardial recess.  Lungs/Pleura: Central airways are patent. Interval development of multiple bilateral pulmonary nodules and right lower lobe mass. Right lower lobe mass measures 3.2 x 2.9 cm (image 35; series 4). Reference right upper lobe nodule measures 10 mm (image 17; series 4). Reference left lower lobe nodule measures 1.3 cm (image 32; series 4). New moderate right pleural effusion. Subpleural consolidative opacities within the right lower lobe.  Musculoskeletal: Postoperative changes compatible with right mastectomy. Additionally there are postoperative changes compatible with interval left mastectomy. There is soft tissue thickening and irregularity at the left mastectomy site (image 32; series 2). Additionally there is a 15 x 0 3 cm fluid collection within the left anterior chest wall soft tissues at the level of the mastectomy site. The left pectoralis musculature is thickened and irregular in appearance.  CT ABDOMEN AND PELVIS FINDINGS  Hepatobiliary: Liver is normal in size and contour. There is a 0.8 cm low-attenuation lesion within the right hepatic lobe (image 43; series 2). There is a large stone within the gallbladder. No gallbladder wall thickening. No intrahepatic or extrahepatic biliary ductal dilatation.  Pancreas: Unremarkable  Spleen: Unremarkable  Adrenals/Urinary Tract: Normal adrenal glands. Kidneys enhance symmetrically with contrast. No hydronephrosis.  Stomach/Bowel: No abnormal bowel wall thickening or evidence for bowel obstruction. The appendix is normal. Diastases of the rectus abdominus musculature.  Vascular/Lymphatic: Normal caliber abdominal aorta. No retroperitoneal lymphadenopathy.  Other: Status post hysterectomy.  Adnexal structures unremarkable.  Musculoskeletal: No aggressive or acute appearing osseous lesions.  IMPRESSION: 1. Interval development of multiple bilateral pulmonary  nodules compatible with metastatic disease. New moderate right pleural effusion potentially secondary to malignant effusion. 2. Interval development of mediastinal and right hilar adenopathy concerning for metastatic disease. 3. Small low-attenuation lesion within the liver, metastatic disease not excluded. 4. Postoperative changes compatible with interval left mastectomy. There is overlying skin thickening and nodularity of the mastectomy site. Additionally there is irregularity and thickening of the adjacent pectoralis musculature. Localized recurrence is not excluded. 5. Large 15 cm fluid collection at the postoperative site within the anterior left chest wall which  may represent seroma.   Electronically Signed   By: Lovey Newcomer M.D.   On: 10/30/2014 16:03   Ct Biopsy  10/31/2014   CLINICAL DATA:  59 year old female with a history of breast cancer and now multiple bilateral pulmonary nodules and a right-sided pleural effusion concerning for metastatic disease. Repeat biopsy is warranted to re-evaluate ER,PR and Her2 markers.  EXAM: CT BIOPSY  Date: 10/31/2014  PROCEDURE: 1. CT-guided biopsy right lower lobe pulmonary nodule Interventional Radiologist:  Criselda Peaches, MD  ANESTHESIA/SEDATION: Moderate (conscious) sedation was used. 0.5 mg Versed, 25 mcg Fentanyl were administered intravenously. The patient's vital signs were monitored continuously by radiology nursing throughout the procedure.  Sedation Time: 12 minutes  MEDICATIONS: None additional  TECHNIQUE: Informed consent was obtained from the patient following explanation of the procedure, risks, benefits and alternatives. The patient understands, agrees and consents for the procedure. All questions were addressed. A time out was performed.  A planning axial CT scan was performed. A suitable pulmonary nodule in the right lower lobe was identified. A suitable skin entry site was selected and marked. The region was then sterilely prepped and draped in  standard fashion using Betadine skin prep. Local anesthesia was attained by infiltration with 1% lidocaine. A small dermatotomy was made. Under intermittent CT fluoroscopic guidance an 18 gauge trocar needle was advanced over the rib and into the margin of the pulmonary nodule. Multiple 18 gauge core biopsies were then obtained coaxially using the bio Pince automated biopsy device.  Biopsy specimens were placed in formalin and delivered to pathology for further analysis. The biopsy device and trocar needle were removed. Post biopsy axial CT imaging demonstrates no evidence of pneumothorax or other complication.  COMPLICATIONS: None  IMPRESSION: Technically successful CT-guided biopsy of right lower lobe pulmonary nodule.  Signed,  Criselda Peaches, MD  [facility]  [facility]   Electronically Signed   By: Jacqulynn Cadet M.D.   On: 10/31/2014 16:50   Dg Abd Acute W/chest  10/30/2014   CLINICAL DATA:  History of breast carcinoma with nausea and vomiting  EXAM: ACUTE ABDOMEN SERIES (ABDOMEN 2 VIEW & CHEST 1 VIEW)  COMPARISON:  Chest radiograph October 28, 2012; CT abdomen and pelvis October 27, 2012  FINDINGS: Multiple mass lesions are noted throughout the lungs consistent with metastatic foci. Nodular lesions range in size from as small as 1 cm to as large as 3.7 x 3.3 cm. The largest mass lesion is in the right base region. There is no edema or consolidation. Heart is upper normal in size with pulmonary vascularity within normal limits. No adenopathy is appreciable. There are surgical clips in each axillary region.  Supine and upright abdomen: There is no bowel dilatation or air-fluid level suggesting obstruction. No free air. There are phleboliths in pelvis. There is degenerative change in the lower thoracic and lumbar spine regions.  IMPRESSION: Multiple nodular lesions in the lungs consistent with pulmonary metastases. No edema or consolidation. Bowel gas  pattern unremarkable. No obstruction or free air.   Electronically Signed   By: Lowella Grip III M.D.   On: 10/30/2014 09:27   US Thoracentesis Asp Pleural Space W/img Guide  11/17/2014   INDICATION: Breast cancer, dyspnea, right pleural effusion; request is made for diagnostic and therapeutic right thoracentesis.  EXAM: ULTRASOUND GUIDED DIAGNOSTIC AND THERAPEUTIC RIGHT THORACENTESIS  COMPARISON:  None.  MEDICATIONS: None  COMPLICATIONS: None immediate  TECHNIQUE: Informed written consent was obtained from the patient after a  discussion of the risks, benefits and alternatives to treatment. A timeout was performed prior to the initiation of the procedure.  Initial ultrasound scanning demonstrates a moderate to large right pleural effusion. The lower chest was prepped and draped in the usual sterile fashion. 1% lidocaine was used for local anesthesia.  An ultrasound image was saved for documentation purposes. A 6 Fr Safe-T-Centesis catheter was introduced. The thoracentesis was performed. The catheter was removed and a dressing was applied. The patient tolerated the procedure well without immediate post procedural complication. The patient was escorted to have an upright chest radiograph.  FINDINGS: A total of approximately 1.3 liters of turbid, amber fluid was removed. Requested samples were sent to the laboratory.  IMPRESSION: Successful ultrasound-guided diagnostic and therapeutic right sided thoracentesis yielding 1.3 liters of pleural fluid.  Read by: Rowe Robert, PA-C   Electronically Signed   By: Sandi Mariscal M.D.   On: 11/17/2014 11:13    Labs:  CBC:  Recent Labs  05/17/14 1102 10/30/14 0905 10/30/14 1300 11/15/14 1053  WBC 4.5 5.9 7.9 5.6  HGB 12.0 12.4 12.5 13.0  HCT 36.4 36.7 37.6 39.0  PLT 241 228 225 270    COAGS:  Recent Labs  10/31/14 0613  INR 1.01    BMP:  Recent Labs  12/14/13 1036  04/19/14 1129 05/17/14 1103 10/30/14 0905 10/30/14 1300 11/15/14 1053  NA  141  < > 142 142 139  --  144  K 3.0*  < > 3.5 3.6 3.3*  --  3.0*  CL 102  --   --   --  100  --   --   CO2 31  < > _0 --  26  GLUCOSE 112*  < > 151* 147* 253*  --  145*  BUN 12  < > 14.3 12.9 11  --  15.9  CALCIUM 9.5  < > 9.7 10.1 9.1  --  9.3  CREATININE 0.8  < > 0.9 0.9 0.77 0.74 0.9  GFRNONAA  --   --   --   --  >90 >90  --   GFRAA  --   --   --   --  >90 >90  --   < > = values in this interval not displayed.  LIVER FUNCTION TESTS:  Recent Labs  04/19/14 1129 05/17/14 1103 10/30/14 0905 11/15/14 1053  BILITOT 0.53 0.48 0.1* 0.51  AST _1 ALT _2 ALKPHOS 117 128 95 83  PROT 7.1 7.4 7.1 7.3  ALBUMIN 3.8 3.9 4.2 3.9    TUMOR MARKERS: No results for input(s): AFPTM, CEA, CA199, CHROMGRNA in the last 8760 hours.  Assessment and Plan: ALIVEA GLADSON is a 59 y.o. female with remote history of right breast cancer in 1988 with mastectomy but no adjuvant treatment, left breast cancer diagnosed in 2014 with mastectomy/chemoradiation/xeloda ; prior right subclavian Port-A-Cath in 2014, subsequently removed secondary to poor function. Now with evidence of disease progression on recent imaging. Request is  made for Port-A-Cath placement for chemotherapy.Risks and benefits discussed with the patient/husband including, but not limited to bleeding, infection, pneumothorax, or fibrin sheath development and need for additional procedures. All of the patient's questions were answered, patient is agreeable to proceed. Consent signed and in chart.       Signed: Autumn Messing 11/23/2014, 12:23 PM   I spent a total of 20 minutes face to face in clinical consultation, greater than 50% of  which was counseling/coordinating care for Port-A-Cath placement

## 2014-11-27 ENCOUNTER — Ambulatory Visit (HOSPITAL_COMMUNITY)
Admission: RE | Admit: 2014-11-27 | Discharge: 2014-11-27 | Disposition: A | Discharge status: Home / Self Care | Payer: BLUE CROSS/BLUE SHIELD | Source: Ambulatory Visit | Attending: Hematology and Oncology | Admitting: Hematology and Oncology

## 2014-11-27 ENCOUNTER — Other Ambulatory Visit: Payer: BLUE CROSS/BLUE SHIELD

## 2014-11-27 ENCOUNTER — Encounter: Payer: Self-pay | Admitting: Hematology and Oncology

## 2014-11-27 DIAGNOSIS — C50412 Malignant neoplasm of upper-outer quadrant of left female breast: Secondary | ICD-10-CM | POA: Diagnosis not present

## 2014-11-27 LAB — GLUCOSE, CAPILLARY: GLUCOSE-CAPILLARY: 122 mg/dL — AB (ref 70–99)

## 2014-11-27 MED ORDER — FLUDEOXYGLUCOSE F - 18 (FDG) INJECTION
10.5000 | Freq: Once | INTRAVENOUS | Status: AC | PRN
  Administered 2014-11-27: 10.5 via INTRAVENOUS

## 2014-11-27 NOTE — Progress Notes (Signed)
I placed fmla forms for hubby on the desk of nurse for Dr. Lindi Adie.

## 2014-11-27 NOTE — Assessment & Plan Note (Signed)
Stage III, triple negative, invasive ductal carcinoma of the left breast. She has completed adjuvant radiation with concurrent Xeloda. She is doing well today. She has no sign of recurrence. 3.9 cm size and 9/13 lymph nodes were positive with extracapsular extension. ER/PR negative HER-2 negative Ki-67 21% March 2016: CT chest revealed bilateral lung nodules biopsy-proven to be triple negative metastatic carcinoma  Current Treatment: Cycle 1 day 1 Halaven (days 1,8 q 3 weeks) to start 12/06/14 Shortness of Breath: S/P Thoracentesis (Malignant cells on cytology)  Radiology review: The largest conglomerate of hypermetabolism is in the right middle lobe with where there is a hypermetabolic (SUVmax = 67.6) mass estimated to measure approximately 3.5 x 4.6 cm Multiple other smaller pulmonary nodules are also noted, also or hypermetabolic. Large right-sided pleural effusion with extensive right pleural hypermetabolism (SUVmax =3.8-4.3), compatible with a malignant pleural effusion  RTC 1 week after starting chemo

## 2014-11-28 ENCOUNTER — Ambulatory Visit (HOSPITAL_BASED_OUTPATIENT_CLINIC_OR_DEPARTMENT_OTHER): Payer: BLUE CROSS/BLUE SHIELD | Admitting: Hematology and Oncology

## 2014-11-28 ENCOUNTER — Other Ambulatory Visit (HOSPITAL_BASED_OUTPATIENT_CLINIC_OR_DEPARTMENT_OTHER): Payer: BLUE CROSS/BLUE SHIELD

## 2014-11-28 ENCOUNTER — Telehealth: Payer: Self-pay | Admitting: Hematology and Oncology

## 2014-11-28 VITALS — BP 129/74 | HR 108 | Temp 97.9°F | Resp 18 | Ht 64.0 in | Wt 216.4 lb

## 2014-11-28 DIAGNOSIS — C7801 Secondary malignant neoplasm of right lung: Secondary | ICD-10-CM

## 2014-11-28 DIAGNOSIS — C50412 Malignant neoplasm of upper-outer quadrant of left female breast: Secondary | ICD-10-CM

## 2014-11-28 DIAGNOSIS — J910 Malignant pleural effusion: Secondary | ICD-10-CM

## 2014-11-28 DIAGNOSIS — C50912 Malignant neoplasm of unspecified site of left female breast: Secondary | ICD-10-CM

## 2014-11-28 LAB — COMPREHENSIVE METABOLIC PANEL (CC13)
ALK PHOS: 83 U/L (ref 40–150)
ALT: 13 U/L (ref 0–55)
AST: 23 U/L (ref 5–34)
Albumin: 3.7 g/dL (ref 3.5–5.0)
Anion Gap: 15 mEq/L — ABNORMAL HIGH (ref 3–11)
BILIRUBIN TOTAL: 0.45 mg/dL (ref 0.20–1.20)
BUN: 11.9 mg/dL (ref 7.0–26.0)
CALCIUM: 9.8 mg/dL (ref 8.4–10.4)
CO2: 21 meq/L — AB (ref 22–29)
CREATININE: 1 mg/dL (ref 0.6–1.1)
Chloride: 107 mEq/L (ref 98–109)
EGFR: 71 mL/min/{1.73_m2} — ABNORMAL LOW (ref >90)
Glucose: 113 mg/dl (ref 70–140)
Potassium: 3.7 mEq/L (ref 3.5–5.1)
SODIUM: 143 meq/L (ref 136–145)
Total Protein: 7 g/dL (ref 6.4–8.3)

## 2014-11-28 LAB — CBC WITH DIFFERENTIAL/PLATELET
BASO%: 0.2 % (ref 0.0–2.0)
BASOS ABS: 0 10*3/uL (ref 0.0–0.1)
EOS%: 3.9 % (ref 0.0–7.0)
Eosinophils Absolute: 0.2 10*3/uL (ref 0.0–0.5)
HEMATOCRIT: 38.3 % (ref 34.8–46.6)
HEMOGLOBIN: 12.8 g/dL (ref 11.6–15.9)
LYMPH#: 1.2 10*3/uL (ref 0.9–3.3)
LYMPH%: 21.2 % (ref 14.0–49.7)
MCH: 30.5 pg (ref 25.1–34.0)
MCHC: 33.4 g/dL (ref 31.5–36.0)
MCV: 91.4 fL (ref 79.5–101.0)
MONO#: 0.5 10*3/uL (ref 0.1–0.9)
MONO%: 8.4 % (ref 0.0–14.0)
NEUT%: 66.3 % (ref 38.4–76.8)
NEUTROS ABS: 3.8 10*3/uL (ref 1.5–6.5)
Platelets: 246 10*3/uL (ref 145–400)
RBC: 4.19 10*6/uL (ref 3.70–5.45)
RDW: 13.3 % (ref 11.2–14.5)
WBC: 5.7 10*3/uL (ref 3.9–10.3)

## 2014-11-28 NOTE — Progress Notes (Signed)
Patient Care Team: Lucretia Kern, DO as PCP - General (Family Medicine)  DIAGNOSIS: Breast cancer of upper-outer quadrant of left female breast   Staging form: Breast, AJCC 7th Edition     Clinical: Stage IIIB (T4, N1, cM0) - Signed by Thea Silversmith, MD on 10/29/2012       Prognostic indicators: ER-, PR- HER2 -      Pathologic: No stage assigned - Unsigned       Prognostic indicators: ER-, PR- HER2 -    SUMMARY OF ONCOLOGIC HISTORY:   Breast cancer of upper-outer quadrant of left female breast   10/06/1986 Initial Diagnosis Right breast mastectomy, breast cancer diagnosed during pregnancy, no adjuvant treatment given   09/30/2012 Initial Biopsy Left breast biopsy done for left nipple retraction: Invasive ductal carcinoma grade 3, ER/PR negative, HER-2 negative, Ki-67 21% with grade 3 DCIS   10/12/2012 Breast MRI Left breast diffuse enhancement measuring 13 x 12.5 x 9.5 cm with diffuse skin thickening and dermal enhancement, multiple enlarged level I left axillary lymph nodes largest 2.4 cm   11/02/2012 - 06/06/2013 Neo-Adjuvant Chemotherapy Neoadjuvant FEC 4 followed by Taxol carbo 8 discontinued for neuropathy, Gemzar Carbo 2   09/15/2013 - 11/01/2013 Radiation Therapy Xeloda with adjuvant radiation   10/30/2014 Imaging Right hilar node 1.6 cm, subcarinal 1.2 cm, 1 cm additional right hilar, right lower lobe 3.2 cm, RUL 1 cm, and LDL of 1.3 cm, moderate pleural effusion, right liver 0.8 cm, 15 cm fluid collection left chest wall   10/31/2014 Initial Biopsy Lung biopsy right lower lobe: Carcinoma with Extensive necrosis, microscopic foci of nonnecrotic non-small cell, poorly differentiated carcinoma, metastatic disease likely ER 0%, PR 0%, HER-2 negative    CHIEF COMPLIANT: Shortness of breath with exertion  INTERVAL HISTORY: Christine [name] is a 59 year old lady with above-mentioned history of metastatic breast cancer with multiple lung nodules and hilar mediastinal nodes who will start  chemotherapy on 12/06/2014 with Allevyn. She underwent thoracentesis and had marked improvement in symptoms. The cells him back as malignant. She underwent a PET CT scan yesterday and is here today to discuss the results of the scan. Her breathing which got significantly better after thoracentesis is starting to get worse again. She feels shortness of breath with minimal exertion. But she would like to hold off on another thoracentesis for a little while longer.  REVIEW OF SYSTEMS:   Constitutional: Denies fevers, chills or abnormal weight loss Eyes: Denies blurriness of vision Ears, nose, mouth, throat, and face: Denies mucositis or sore throat Respiratory: Cough and shortness of breath Cardiovascular: Denies palpitation, chest discomfort or lower extremity swelling Gastrointestinal:  Denies nausea, heartburn or change in bowel habits Skin: Denies abnormal skin rashes Lymphatics: Denies new lymphadenopathy or easy bruising Neurological:Denies numbness, tingling or new weaknesses Behavioral/Psych: Mood is stable, no new changes   All other systems were reviewed with the patient and are negative.  I have reviewed the past medical history, past surgical history, social history and family history with the patient and they are unchanged from previous note.  ALLERGIES:  is allergic to sulfa antibiotics and sulfonamide derivatives.  MEDICATIONS:  Current Outpatient Prescriptions  Medication Sig Dispense Refill  . cetirizine (ZYRTEC) 10 MG tablet Take 10 mg by mouth daily.    Christiane Ha Oil (CHIA SEED OIL EXTRACT PO) Take 1 each by mouth 4 (four) times a week.    . cholecalciferol (VITAMIN D) 1000 UNITS tablet Take 1,000 Units by mouth every morning.     Marland Kitchen  diltiazem (CARDIZEM CD) 240 MG 24 hr capsule Take 240 mg by mouth every morning.     Marland Kitchen esomeprazole (NEXIUM) 20 MG capsule Take 20 mg by mouth daily at 12 noon.    . Flaxseed, Linseed, (FLAX SEEDS PO) Take 1 each by mouth 4 (four) times a week.     . fluticasone (FLONASE) 50 MCG/ACT nasal spray Place 2 sprays into both nostrils daily as needed for allergies or rhinitis.    Marland Kitchen latanoprost (XALATAN) 0.005 % ophthalmic solution Place 1 drop into both eyes at bedtime.     Marland Kitchen levothyroxine (SYNTHROID, LEVOTHROID) 50 MCG tablet Take 1 tablet (50 mcg total) by mouth daily. 90 tablet 3  . lidocaine-prilocaine (EMLA) cream Apply to affected area once 30 g 3  . LORazepam (ATIVAN) 0.5 MG tablet Take 1 tablet (0.5 mg total) by mouth every 6 (six) hours as needed (Nausea or vomiting). 30 tablet 0  . losartan-hydrochlorothiazide (HYZAAR) 50-12.5 MG per tablet Take 1 tablet by mouth daily. 90 tablet 0  . metFORMIN (GLUCOPHAGE) 500 MG tablet Take 2 tablets (1,000 mg total) by mouth 2 (two) times daily with a meal. 120 tablet 1  . naproxen sodium (ANAPROX) 220 MG tablet Take 440 mg by mouth daily as needed (pain).    . NON FORMULARY Apply 1 application topically daily as needed (Applies to feet for athlete's foot.). Fungifoam    . ondansetron (ZOFRAN) 8 MG tablet Take 1 tablet (8 mg total) by mouth 2 (two) times daily. Start the day after chemo for 2 days. Then take as needed for nausea or vomiting. 30 tablet 1  . oxyCODONE (OXY IR/ROXICODONE) 5 MG immediate release tablet Take 1 tablet (5 mg total) by mouth every 4 (four) hours as needed for moderate pain. 30 tablet 0  . potassium chloride (KLOR-CON) 20 MEQ packet Take 20 mEq by mouth 2 (two) times daily.    . prochlorperazine (COMPAZINE) 10 MG tablet Take 1 tablet (10 mg total) by mouth every 6 (six) hours as needed (Nausea or vomiting). 30 tablet 1  . Tetrahydrozoline HCl (VISINE OP) Apply 1-2 drops to eye daily as needed (itchy eyes.).     No current facility-administered medications for this visit.    PHYSICAL EXAMINATION: ECOG PERFORMANCE STATUS: 2 - Symptomatic, <50% confined to bed  Filed Vitals:   11/28/14 1344  BP: 129/74  Pulse: 108  Temp: 97.9 F (36.6 C)  Resp: 18   Filed Weights    11/28/14 1344  Weight: 216 lb 6 oz (98.147 kg)    GENERAL:alert, no distress and comfortable SKIN: skin color, texture, turgor are normal, no rashes or significant lesions EYES: normal, Conjunctiva are pink and non-injected, sclera clear OROPHARYNX:no exudate, no erythema and lips, buccal mucosa, and tongue normal  NECK: supple, thyroid normal size, non-tender, without nodularity LYMPH:  no palpable lymphadenopathy in the cervical, axillary or inguinal LUNGS: Diminished breath sounds in the right lung base and dullness to percussion with some crackles at the right lung base HEART: regular rate & rhythm and no murmurs and no lower extremity edema ABDOMEN:abdomen soft, non-tender and normal bowel sounds Musculoskeletal:no cyanosis of digits and no clubbing  NEURO: alert & oriented x 3 with fluent speech, no focal motor/sensory deficits  LABORATORY DATA:  I have reviewed the data as listed   Chemistry      Component Value Date/Time   NA 141 11/23/2014 1230   NA 144 11/15/2014 1053   K 3.7 11/23/2014 1230   K 3.0*  11/15/2014 1053   CL 103 11/23/2014 1230   CL 101 01/25/2013 0952   CO2 23 11/23/2014 1230   CO2 26 11/15/2014 1053   BUN 17 11/23/2014 1230   BUN 15.9 11/15/2014 1053   CREATININE 1.18* 11/23/2014 1230   CREATININE 0.9 11/15/2014 1053      Component Value Date/Time   CALCIUM 9.5 11/23/2014 1230   CALCIUM 9.3 11/15/2014 1053   ALKPHOS 83 11/15/2014 1053   ALKPHOS 95 10/30/2014 0905   AST 26 11/15/2014 1053   AST 31 10/30/2014 0905   ALT 19 11/15/2014 1053   ALT 21 10/30/2014 0905   BILITOT 0.51 11/15/2014 1053   BILITOT 0.1* 10/30/2014 0905       Lab Results  Component Value Date   WBC 5.7 11/28/2014   HGB 12.8 11/28/2014   HCT 38.3 11/28/2014   MCV 91.4 11/28/2014   PLT 246 11/28/2014   NEUTROABS 3.8 11/28/2014     RADIOGRAPHIC STUDIES: I have personally reviewed the radiology reports and agreed with their findings. Nm Pet Image Restag (ps) Skull  Base To Thigh  11/27/2014   CLINICAL DATA:  Subsequent treatment strategy for metastatic breast cancer. Restaging examination.  EXAM: NUCLEAR MEDICINE PET SKULL BASE TO THIGH  TECHNIQUE: 10.5 mCi F-18 FDG was injected intravenously. Full-ring PET imaging was performed from the skull base to thigh after the radiotracer. CT data was obtained and used for attenuation correction and anatomic localization.  FASTING BLOOD GLUCOSE:  Value: 122 mg/dl  COMPARISON:  CT of the chest, abdomen and pelvis 10/30/2014. PET-CT 10/27/2012.  FINDINGS: NECK  No hypermetabolic lymph nodes in the neck.  CHEST  Numerous hypermetabolic nodules and masses are noted scattered throughout the lungs bilaterally, compatible with widespread metastatic disease. The largest conglomerate of hypermetabolism is in the right middle lobe with where there is a hypermetabolic (SUVmax = 67.8) mass estimated to measure approximately 3.5 x 4.6 cm (image 85 of series 4). Multiple other smaller pulmonary nodules are also noted, also or hypermetabolic. Large right-sided pleural effusion with extensive right pleural hypermetabolism (SUVmax = 3.8-4.3), compatible with a malignant pleural effusion. This is associated with extensive passive atelectasis in the right lung, particularly in the right lower lobe. Extensive right hilar and mediastinal hypermetabolic lymphadenopathy, difficult to measure on today's noncontrast CT examination. Postoperative changes of left-sided modified radical mastectomy and left axillary nodal dissection are noted, with what appears to be a chronic postoperative fluid collection in the left chest wall, similar to the prior examination, presumably a large seroma. No significant hypermetabolism is noted to suggest local recurrence of disease. Right internal jugular single-lumen porta cath with tip terminating in the right atrium.  ABDOMEN/PELVIS  No abnormal hypermetabolic activity within the liver, pancreas, adrenal glands, or spleen.  No hypermetabolic lymph nodes in the abdomen or pelvis. 3 cm partially calcified gallstone in the gallbladder. No current findings to suggest an acute cholecystitis at this time. No significant volume of ascites. No pneumoperitoneum. No pathologic distention of small bowel or colon. Numerous colonic diverticulae are noted, without surrounding inflammatory changes to suggest an acute diverticulitis at this time. Status post hysterectomy. Ovaries are unremarkable in appearance.  SKELETON  No focal hypermetabolic activity to suggest skeletal metastasis.  IMPRESSION: 1. Widespread metastatic disease to the thorax, as evidenced by numerous pulmonary nodules and masses, a malignant right pleural effusion, and right hilar and mediastinal malignant lymphadenopathy, as detailed above. 2. No definite signs of other metastatic disease in the neck, abdomen or pelvis. 3. No  definite skeletal metastases. 4. Cholelithiasis without evidence of acute cholecystitis. 5. Colonic diverticulosis without findings to suggest acute diverticulitis at this time. 6. Additional incidental findings, as above.   Electronically Signed   By: Vinnie Langton M.D.   On: 11/27/2014 09:46     ASSESSMENT & PLAN:  Breast cancer of upper-outer quadrant of left female breast Stage III, triple negative, invasive ductal carcinoma of the left breast. She has completed adjuvant radiation with concurrent Xeloda. She is doing well today. She has no sign of recurrence. 3.9 cm size and 9/13 lymph nodes were positive with extracapsular extension. ER/PR negative HER-2 negative Ki-67 21% March 2016: CT chest revealed bilateral lung nodules biopsy-proven to be triple negative metastatic carcinoma  Current Treatment: Cycle 1 day 1 [place]aven (days 1,8 q 3 weeks) to start 12/06/14 Shortness of Breath: S/P Thoracentesis (Malignant cells on cytology); patient's pleural effusion appears to be coming back. I discussed with her different options including pleurodesis.  At this point we'll continue to watch and monitor her. If she does worsening shortness of breath and we will set her up for another thoracentesis.  Radiology review: The largest conglomerate of hypermetabolism is in the right middle lobe with where there is a hypermetabolic (SUVmax = 15.1) mass estimated to measure approximately 3.5 x 4.6 cm Multiple other smaller pulmonary nodules are also noted, also or hypermetabolic. Large right-sided pleural effusion with extensive right pleural hypermetabolism (SUVmax =3.8-4.3), compatible with a malignant pleural effusion  RTC 1 week after starting chemo.    No orders of the defined types were placed in this encounter.   The patient has a good understanding of the overall plan. she agrees with it. She will call with any problems that may develop before her next visit here.   Rulon Eisenmenger, MD

## 2014-11-28 NOTE — Telephone Encounter (Signed)
Appointments made and avs printed for patient °

## 2014-11-30 ENCOUNTER — Ambulatory Visit: Payer: BLUE CROSS/BLUE SHIELD

## 2014-12-01 ENCOUNTER — Telehealth: Payer: Self-pay | Admitting: *Deleted

## 2014-12-01 ENCOUNTER — Emergency Department (HOSPITAL_COMMUNITY): Payer: BLUE CROSS/BLUE SHIELD

## 2014-12-01 ENCOUNTER — Inpatient Hospital Stay (HOSPITAL_COMMUNITY)
Admission: EM | Admit: 2014-12-01 | Discharge: 2014-12-03 | DRG: 187 | Disposition: A | Discharge status: Home / Self Care | Payer: BLUE CROSS/BLUE SHIELD | Attending: Internal Medicine | Admitting: Internal Medicine

## 2014-12-01 ENCOUNTER — Encounter (HOSPITAL_COMMUNITY): Payer: Self-pay | Admitting: *Deleted

## 2014-12-01 DIAGNOSIS — C7801 Secondary malignant neoplasm of right lung: Secondary | ICD-10-CM | POA: Diagnosis present

## 2014-12-01 DIAGNOSIS — Z9010 Acquired absence of unspecified breast and nipple: Secondary | ICD-10-CM | POA: Diagnosis present

## 2014-12-01 DIAGNOSIS — Z853 Personal history of malignant neoplasm of breast: Secondary | ICD-10-CM

## 2014-12-01 DIAGNOSIS — E039 Hypothyroidism, unspecified: Secondary | ICD-10-CM | POA: Diagnosis not present

## 2014-12-01 DIAGNOSIS — I10 Essential (primary) hypertension: Secondary | ICD-10-CM | POA: Diagnosis present

## 2014-12-01 DIAGNOSIS — J90 Pleural effusion, not elsewhere classified: Secondary | ICD-10-CM | POA: Diagnosis present

## 2014-12-01 DIAGNOSIS — R7301 Impaired fasting glucose: Secondary | ICD-10-CM | POA: Diagnosis present

## 2014-12-01 DIAGNOSIS — R739 Hyperglycemia, unspecified: Secondary | ICD-10-CM | POA: Diagnosis present

## 2014-12-01 DIAGNOSIS — E8770 Fluid overload, unspecified: Secondary | ICD-10-CM

## 2014-12-01 DIAGNOSIS — K219 Gastro-esophageal reflux disease without esophagitis: Secondary | ICD-10-CM | POA: Diagnosis present

## 2014-12-01 DIAGNOSIS — Z9889 Other specified postprocedural states: Secondary | ICD-10-CM

## 2014-12-01 DIAGNOSIS — E876 Hypokalemia: Secondary | ICD-10-CM | POA: Diagnosis not present

## 2014-12-01 DIAGNOSIS — R0600 Dyspnea, unspecified: Secondary | ICD-10-CM

## 2014-12-01 DIAGNOSIS — R0602 Shortness of breath: Secondary | ICD-10-CM | POA: Diagnosis present

## 2014-12-01 DIAGNOSIS — C799 Secondary malignant neoplasm of unspecified site: Secondary | ICD-10-CM | POA: Diagnosis not present

## 2014-12-01 HISTORY — DX: Type 2 diabetes mellitus without complications: E11.9

## 2014-12-01 LAB — COMPREHENSIVE METABOLIC PANEL
ALT: 17 U/L (ref 0–35)
ANION GAP: 12 (ref 5–15)
AST: 29 U/L (ref 0–37)
Albumin: 3.7 g/dL (ref 3.5–5.2)
Alkaline Phosphatase: 74 U/L (ref 39–117)
BUN: 12 mg/dL (ref 6–23)
CALCIUM: 8.9 mg/dL (ref 8.4–10.5)
CHLORIDE: 100 mmol/L (ref 96–112)
CO2: 29 mmol/L (ref 19–32)
CREATININE: 1.02 mg/dL (ref 0.50–1.10)
GFR, EST AFRICAN AMERICAN: 69 mL/min — AB (ref >90)
GFR, EST NON AFRICAN AMERICAN: 59 mL/min — AB (ref >90)
Glucose, Bld: 128 mg/dL — ABNORMAL HIGH (ref 70–99)
Potassium: 3 mmol/L — ABNORMAL LOW (ref 3.5–5.1)
Sodium: 141 mmol/L (ref 135–145)
Total Bilirubin: 0.7 mg/dL (ref 0.3–1.2)
Total Protein: 6.7 g/dL (ref 6.0–8.3)

## 2014-12-01 LAB — GLUCOSE, CAPILLARY: Glucose-Capillary: 116 mg/dL — ABNORMAL HIGH (ref 70–99)

## 2014-12-01 LAB — PROTIME-INR
INR: 0.99 (ref 0.00–1.49)
Prothrombin Time: 13.2 seconds (ref 11.6–15.2)

## 2014-12-01 MED ORDER — FLUTICASONE PROPIONATE 50 MCG/ACT NA SUSP: 2.0000 | Freq: Every day | NASAL | Status: DC | PRN

## 2014-12-01 MED ORDER — PANTOPRAZOLE SODIUM 40 MG PO TBEC
40.0000 mg | DELAYED_RELEASE_TABLET | Freq: Every day | ORAL | Status: DC
  Administered 2014-12-02 – 2014-12-03 (×2): 40 mg via ORAL
  Filled 2014-12-01 (×2): qty 1

## 2014-12-01 MED ORDER — OXYCODONE HCL 5 MG PO TABS
5.0000 mg | ORAL_TABLET | ORAL | Status: DC | PRN
  Administered 2014-12-02 – 2014-12-03 (×2): 5 mg via ORAL
  Filled 2014-12-01 (×2): qty 1

## 2014-12-01 MED ORDER — PROCHLORPERAZINE MALEATE 10 MG PO TABS
10.0000 mg | ORAL_TABLET | Freq: Four times a day (QID) | ORAL | Status: DC | PRN
  Filled 2014-12-01: qty 1

## 2014-12-01 MED ORDER — SODIUM CHLORIDE 0.9 % IV SOLN: 250.0000 mL | INTRAVENOUS | Status: DC | PRN

## 2014-12-01 MED ORDER — SODIUM CHLORIDE 0.9 % IJ SOLN
3.0000 mL | Freq: Two times a day (BID) | INTRAMUSCULAR | Status: DC
  Administered 2014-12-02: 3 mL via INTRAVENOUS

## 2014-12-01 MED ORDER — HYDROCHLOROTHIAZIDE 12.5 MG PO CAPS
12.5000 mg | ORAL_CAPSULE | Freq: Every day | ORAL | Status: DC
  Administered 2014-12-02: 12.5 mg via ORAL
  Filled 2014-12-01 (×2): qty 1

## 2014-12-01 MED ORDER — ONDANSETRON HCL 4 MG PO TABS
8.0000 mg | ORAL_TABLET | Freq: Two times a day (BID) | ORAL | Status: DC | PRN
  Administered 2014-12-02 – 2014-12-03 (×2): 8 mg via ORAL
  Filled 2014-12-01: qty 2
  Filled 2014-12-01 (×2): qty 1
  Filled 2014-12-01: qty 2

## 2014-12-01 MED ORDER — INSULIN ASPART 100 UNIT/ML ~~LOC~~ SOLN
0.0000 [IU] | Freq: Three times a day (TID) | SUBCUTANEOUS | Status: DC
  Administered 2014-12-02: 1 [IU] via SUBCUTANEOUS

## 2014-12-01 MED ORDER — CHLORHEXIDINE GLUCONATE 0.12 % MT SOLN
15.0000 mL | Freq: Two times a day (BID) | OROMUCOSAL | Status: DC
  Administered 2014-12-02 – 2014-12-03 (×3): 15 mL via OROMUCOSAL
  Filled 2014-12-01 (×5): qty 15

## 2014-12-01 MED ORDER — POTASSIUM CHLORIDE CRYS ER 20 MEQ PO TBCR
20.0000 meq | EXTENDED_RELEASE_TABLET | Freq: Once | ORAL | Status: AC
  Administered 2014-12-01: 20 meq via ORAL
  Filled 2014-12-01: qty 1

## 2014-12-01 MED ORDER — LOSARTAN POTASSIUM 50 MG PO TABS
50.0000 mg | ORAL_TABLET | Freq: Every day | ORAL | Status: DC
Start: 2014-12-02 — End: 2014-12-03
  Administered 2014-12-02: 50 mg via ORAL
  Filled 2014-12-01 (×2): qty 1

## 2014-12-01 MED ORDER — ENOXAPARIN SODIUM 30 MG/0.3ML ~~LOC~~ SOLN
30.0000 mg | SUBCUTANEOUS | Status: DC
  Administered 2014-12-01: 30 mg via SUBCUTANEOUS
  Filled 2014-12-01 (×2): qty 0.3

## 2014-12-01 MED ORDER — LATANOPROST 0.005 % OP SOLN
1.0000 [drp] | Freq: Every day | OPHTHALMIC | Status: DC
  Administered 2014-12-01 – 2014-12-02 (×2): 1 [drp] via OPHTHALMIC
  Filled 2014-12-01 (×2): qty 2.5

## 2014-12-01 MED ORDER — LOSARTAN POTASSIUM-HCTZ 50-12.5 MG PO TABS: 1.0000 | ORAL_TABLET | Freq: Every day | ORAL | Status: DC

## 2014-12-01 MED ORDER — VITAMIN D3 25 MCG (1000 UNIT) PO TABS
1000.0000 [IU] | ORAL_TABLET | Freq: Every morning | ORAL | Status: DC
  Administered 2014-12-02 – 2014-12-03 (×2): 1000 [IU] via ORAL
  Filled 2014-12-01 (×2): qty 1

## 2014-12-01 MED ORDER — CETYLPYRIDINIUM CHLORIDE 0.05 % MT LIQD
7.0000 mL | Freq: Two times a day (BID) | OROMUCOSAL | Status: DC
  Administered 2014-12-02: 7 mL via OROMUCOSAL

## 2014-12-01 MED ORDER — ACETAMINOPHEN 325 MG PO TABS
650.0000 mg | ORAL_TABLET | Freq: Four times a day (QID) | ORAL | Status: DC | PRN
  Administered 2014-12-02: 650 mg via ORAL
  Filled 2014-12-01 (×2): qty 2

## 2014-12-01 MED ORDER — LORATADINE 10 MG PO TABS
10.0000 mg | ORAL_TABLET | Freq: Every day | ORAL | Status: DC
  Administered 2014-12-02 – 2014-12-03 (×2): 10 mg via ORAL
  Filled 2014-12-01 (×2): qty 1

## 2014-12-01 MED ORDER — SODIUM CHLORIDE 0.9 % IJ SOLN: 3.0000 mL | INTRAMUSCULAR | Status: DC | PRN

## 2014-12-01 MED ORDER — SODIUM CHLORIDE 0.9 % IJ SOLN
3.0000 mL | Freq: Two times a day (BID) | INTRAMUSCULAR | Status: DC
  Administered 2014-12-02 – 2014-12-03 (×2): 3 mL via INTRAVENOUS

## 2014-12-01 MED ORDER — DILTIAZEM HCL ER COATED BEADS 240 MG PO CP24
240.0000 mg | ORAL_CAPSULE | Freq: Every morning | ORAL | Status: DC
  Administered 2014-12-02: 240 mg via ORAL
  Filled 2014-12-01 (×2): qty 1

## 2014-12-01 MED ORDER — POTASSIUM CHLORIDE CRYS ER 10 MEQ PO TBCR
20.0000 meq | EXTENDED_RELEASE_TABLET | Freq: Two times a day (BID) | ORAL | Status: DC
  Administered 2014-12-01 – 2014-12-03 (×4): 20 meq via ORAL
  Filled 2014-12-01 (×5): qty 2

## 2014-12-01 MED ORDER — INSULIN ASPART 100 UNIT/ML ~~LOC~~ SOLN: 0.0000 [IU] | Freq: Every day | SUBCUTANEOUS | Status: DC

## 2014-12-01 MED ORDER — LORAZEPAM 0.5 MG PO TABS
0.5000 mg | ORAL_TABLET | Freq: Four times a day (QID) | ORAL | Status: DC | PRN
  Administered 2014-12-01: 0.5 mg via ORAL
  Filled 2014-12-01: qty 1

## 2014-12-01 MED ORDER — METFORMIN HCL 500 MG PO TABS
1000.0000 mg | ORAL_TABLET | Freq: Two times a day (BID) | ORAL | Status: DC
  Administered 2014-12-02 – 2014-12-03 (×2): 1000 mg via ORAL
  Filled 2014-12-01 (×5): qty 2

## 2014-12-01 MED ORDER — LEVOTHYROXINE SODIUM 50 MCG PO TABS
50.0000 ug | ORAL_TABLET | Freq: Every day | ORAL | Status: DC
  Administered 2014-12-02 – 2014-12-03 (×2): 50 ug via ORAL
  Filled 2014-12-01 (×3): qty 1

## 2014-12-01 MED ORDER — ACETAMINOPHEN 650 MG RE SUPP: 650.0000 mg | Freq: Four times a day (QID) | RECTAL | Status: DC | PRN

## 2014-12-01 MED ORDER — TETRAHYDROZOLINE HCL 0.05 % OP SOLN: 1.0000 [drp] | Freq: Every day | OPHTHALMIC | Status: DC | PRN

## 2014-12-01 NOTE — ED Notes (Signed)
Pt given gingerale and crackers 

## 2014-12-01 NOTE — ED Notes (Signed)
Pt sent here by Dr Hilario Quarry (her cancer MD), for a thoracentesis.  Was breast ca survivor that recently found out ca had spread to lungs.  Due to start chemo next week.

## 2014-12-01 NOTE — Telephone Encounter (Signed)
Patient called and stated,"I've been short of breath all day, I'm struggling, very fatigued, and I need one of those thoracentesis. Can Radiology do one today?" Instructed patient this late in the day to go to [facility] ER. Patient verbalized understanding. Her husband will be driving.

## 2014-12-01 NOTE — H&P (Signed)
Christine [name] is an 59 y.o. female.    Christine [name] (pcp) Dr. Duanne Moron (oncologist) Dr. Emmit Pomfret (ob-gyn)  Chief Complaint:   sob HPI: 59 yo female with hx of breast cancer apparently c/o sob,  Presented to ED for evaluation. Dry cough.  Pt notes that she had thoracentesis  Recently.  Denies fever, chills, cp, palp, n/v, diarrhea.  CXR showed right pleural efffusion.  Pt will be admitted for therapeutic thoracentesis.   Past Medical History  Diagnosis Date  . ALLERGIC RHINITIS   . GERD (gastroesophageal reflux disease)   . Hypertension   . Hypothyroidism   . Hx of colonic polyps   . Wears glasses   . Breast cancer 61, age 27    right  . Breast cancer 09/30/12    left, ER/PR -, Her 2 -  . Radiation 09/15/13-11/01/13    Left chest wall/PAB/scar/supraclavicular fossa  . SYNCOPE 05/15/2010    Qualifier: Diagnosis of  By: Arnoldo Morale MD, [place], WITH RADICULOPATHY 10/01/2007    Qualifier: Diagnosis of  By: Arnoldo Morale MD, [place] 05/31/2007    Qualifier: Diagnosis of  By: Arnoldo Morale MD, [place], HX OF 05/31/2007    Qualifier: Diagnosis of  By: Arnoldo Morale MD, [place]inda [place]   . Lymph edema L arm from breast ca tx 12/22/2013    Past Surgical History  Procedure Laterality Date  . Tonsillectomy    . Caesarean section      x 1  . Mastectomy  04/1987    right side  . Abdominal hysterectomy  06/1988    fibroids  . Portacath placement Right 10/28/2012    Procedure: PORT PLACEMENT;  Surgeon: Joyice Faster. Cornett, MD;  Location: [facility];  Service: General;  Laterality: Right;  Right Subclavian Vein  . Mastectomy modified radical Left 07/19/2013    Procedure: MASTECTOMY MODIFIED RADICAL;  Surgeon: Marcello Moores A. Cornett, MD;  Location: [facility];  Service: General;  Laterality: Left;  . Port-a-cath removal Right 07/19/2013    Procedure: REMOVAL PORT-A-CATH;  Surgeon: Joyice Faster. Cornett, MD;  Location: [facility];  Service:  General;  Laterality: Right;  . Breast surgery Left 07/19/13    MRM    Family History  Problem Relation Age of Onset  . Colon cancer Mother 6    family hx of colon ca 1st degree relative <60  . Coronary artery disease Father     family hx of CAD female 1st degree relative <50  . Stomach cancer Neg Hx   . Rectal cancer Neg Hx   . Esophageal cancer Neg Hx   . Breast cancer Maternal Grandmother 80  . Diabetes Paternal Grandmother   . Breast cancer Cousin 42    maternal cousin  . Coronary artery disease Maternal Grandfather   . Breast cancer Sister     paternal half sister; died in her 14s   Social History:  reports that she has never smoked. She has never used smokeless tobacco. She reports that she does not drink alcohol or use illicit drugs.  Allergies:  Allergies  Allergen Reactions  . Sulfa Antibiotics Hives, Itching and Swelling  . Sulfonamide Derivatives Hives, Itching and Swelling  Medication reviewed    (Not in a hospital admission)  Results for orders placed or performed during the hospital encounter of 12/01/14 (from the past 48 hour(s))  Comprehensive metabolic panel     Status: Abnormal  Collection Time: 12/01/14  6:17 PM  Result Value Ref Range   Sodium 141 135 - 145 mmol/L   Potassium 3.0 (L) 3.5 - 5.1 mmol/L   Chloride 100 96 - 112 mmol/L   CO2 29 19 - 32 mmol/L   Glucose, Bld 128 (H) 70 - 99 mg/dL   BUN 12 6 - 23 mg/dL   Creatinine, Ser 1.02 0.50 - 1.10 mg/dL   Calcium 8.9 8.4 - 10.5 mg/dL   Total Protein 6.7 6.0 - 8.3 g/dL   Albumin 3.7 3.5 - 5.2 g/dL   AST 29 0 - 37 U/L   ALT 17 0 - 35 U/L   Alkaline Phosphatase 74 39 - 117 U/L   Total Bilirubin 0.7 0.3 - 1.2 mg/dL   GFR calc non Af Amer 59 (L) >90 mL/min   GFR calc Af Amer 69 (L) >90 mL/min    Comment: (NOTE) The eGFR has been calculated using the CKD EPI equation. This calculation has not been validated in all clinical situations. eGFR's persistently <90 mL/min signify possible Chronic  Kidney Disease.    Anion gap 12 5 - 15  Protime-INR     Status: None   Collection Time: 12/01/14  6:17 PM  Result Value Ref Range   Prothrombin Time 13.2 11.6 - 15.2 seconds   INR 0.99 0.00 - 1.49   Dg Chest 2 View  12/01/2014   CLINICAL DATA:  Short of breath. Thoracentesis 2 weeks ago. Breast cancer with lung metastasis.  EXAM: CHEST  2 VIEW  COMPARISON:  11/17/2014  FINDINGS: Right Port-A-Cath which terminates at the low SVC. Mild tracheal deviation left. Mild cardiomegaly. Increase in moderate to large right pleural effusion. No pneumothorax. Pulmonary metastasis again identified. Worsened left-sided aeration with airspace disease throughout the right mid and lower lung. Minimal volume loss at the left lung base.  IMPRESSION: Worsened right-sided aeration due to increased pleural fluid and adjacent atelectasis or infection.  Pulmonary metastasis, as before.   Electronically Signed   By: Abigail Miyamoto M.D.   On: 12/01/2014 20:42    Review of Systems  Constitutional: Negative.   HENT: Negative.   Eyes: Negative.   Respiratory: Positive for cough and shortness of breath. Negative for hemoptysis, sputum production and wheezing.   Cardiovascular: Negative.   Gastrointestinal: Negative.   Genitourinary: Negative.   Musculoskeletal: Negative.   Skin: Negative.   Neurological: Negative.   Endo/Heme/Allergies: Negative.   Psychiatric/Behavioral: Negative.     Blood pressure 105/56, pulse 99, temperature 98.1 F (36.7 C), resp. rate 20, weight 92.194 kg (203 lb 4 oz), SpO2 95 %. Physical Exam  Constitutional: She is oriented to person, place, and time. She appears well-developed and well-nourished.  HENT:  Head: Normocephalic and atraumatic.  Mouth/Throat: No oropharyngeal exudate.  Eyes: Conjunctivae and EOM are normal. Pupils are equal, round, and reactive to light. No scleral icterus.  Neck: Normal range of motion. Neck supple. No JVD present. No tracheal deviation present. No  thyromegaly present.  Cardiovascular: Normal rate and regular rhythm.  Exam reveals no gallop and no friction rub.   No murmur heard. Respiratory: No respiratory distress. She has no wheezes. She has no rales.  Decrease bs at right lung base  GI: Soft. Bowel sounds are normal. She exhibits no distension. There is no tenderness. There is no rebound and no guarding.  Musculoskeletal: Normal range of motion. She exhibits no edema or tenderness.  Lymphadenopathy:    She has no cervical adenopathy.  Neurological: She  is alert and oriented to person, place, and time. She displays normal reflexes. No cranial nerve deficit. She exhibits normal muscle tone. Coordination normal.  Skin: Skin is warm and dry. No rash noted. No erythema. No pallor.  Psychiatric: She has a normal mood and affect. Her behavior is normal. Judgment and thought content normal.     Assessment/Plan R pleural effusion NPO after mn IR to do therapeutic paracentesis  Hyperglycemia hga1c  Hypokalemia Replete Check cmp  Hypothyroidism Check tsh  Breast cancer Stable,  F/u with oncology  DVT prophylaxis:  scd , lovenox  Jani Gravel 12/01/2014, 9:11 PM

## 2014-12-01 NOTE — ED Notes (Signed)
Dr. Kim at bedside   

## 2014-12-01 NOTE — Progress Notes (Signed)
Report taken from [place], RN in ED.

## 2014-12-01 NOTE — ED Provider Notes (Signed)
CSN: [id number]     Arrival date & time 12/01/14  1704 History   First MD Initiated Contact with Patient 12/01/14 1731     Chief Complaint  Patient presents with  . Shortness of Breath    needs thoracentesis     (Consider location/radiation/quality/duration/timing/severity/associated sxs/prior Treatment) HPI Comments: Patient is a 59 year old female with history of breast cancer with thoracic metastasis. She presents for evaluation of progressive shortness of breath over the past week. She denies any chest pain, cough, or fever. She had a thoracentesis performed several weeks ago due to a large pleural effusion. She was told that this was likely returning and was told to come here for evaluation of this and possible thoracentesis. She apparently spoke with Dr. Lindi Adie from oncology who told her to come here.  Patient is a 59 y.o. female presenting with shortness of breath. The history is provided by the patient.  Shortness of Breath Severity:  Moderate Onset quality:  Gradual Duration:  1 week Timing:  Constant Progression:  Worsening Chronicity:  Recurrent Context: activity   Context: not URI   Relieved by:  Nothing Worsened by:  Movement and exertion Ineffective treatments:  None tried Associated symptoms: no abdominal pain, no chest pain and no fever     Past Medical History  Diagnosis Date  . ALLERGIC RHINITIS   . GERD (gastroesophageal reflux disease)   . Hypertension   . Hypothyroidism   . Hx of colonic polyps   . Wears glasses   . Breast cancer 28, age 77    right  . Breast cancer 09/30/12    left, ER/PR -, Her 2 -  . Radiation 09/15/13-11/01/13    Left chest wall/PAB/scar/supraclavicular fossa  . SYNCOPE 05/15/2010    Qualifier: Diagnosis of  By: Arnoldo Morale MD, Madarang Swift, WITH RADICULOPATHY 10/01/2007    Qualifier: Diagnosis of  By: Arnoldo Morale MD, [place] 05/31/2007    Qualifier: Diagnosis of  By: Arnoldo Morale MD, [place], HX OF  05/31/2007    Qualifier: Diagnosis of  By: Arnoldo Morale MD, Balinda Quails   . Lymph edema L arm from breast ca tx 12/22/2013   Past Surgical History  Procedure Laterality Date  . Tonsillectomy    . Caesarean section      x 1  . Mastectomy  04/1987    right side  . Abdominal hysterectomy  06/1988    fibroids  . Portacath placement Right 10/28/2012    Procedure: PORT PLACEMENT;  Surgeon: Joyice Faster. Cornett, MD;  Location: [place];  Service: General;  Laterality: Right;  Right Subclavian Vein  . Mastectomy modified radical Left 07/19/2013    Procedure: MASTECTOMY MODIFIED RADICAL;  Surgeon: Marcello Moores A. Cornett, MD;  Location: [place];  Service: General;  Laterality: Left;  . Port-a-cath removal Right 07/19/2013    Procedure: REMOVAL PORT-A-CATH;  Surgeon: Joyice Faster. Cornett, MD;  Location: [place];  Service: General;  Laterality: Right;  . Breast surgery Left 07/19/13    MRM   Family History  Problem Relation Age of Onset  . Colon cancer Mother 64    family hx of colon ca 1st degree relative <60  . Coronary artery disease Father     family hx of CAD female 1st degree relative <50  . Stomach cancer Neg Hx   . Rectal cancer Neg Hx   . Esophageal cancer Neg Hx   .  Breast cancer Maternal Grandmother 80  . Diabetes Paternal Grandmother   . Breast cancer Cousin 42    maternal cousin  . Coronary artery disease Maternal Grandfather   . Breast cancer Sister     paternal half sister; died in her 21s   History  Substance Use Topics  . Smoking status: Never Smoker   . Smokeless tobacco: Never Used  . Alcohol Use: No   OB History    Obstetric Comments   meanrche age 62, G59 , p 1, menopause 49-50, no HRT     Review of Systems  Constitutional: Negative for fever.  Respiratory: Positive for shortness of breath.   Cardiovascular: Negative for chest pain.  Gastrointestinal: Negative for abdominal pain.  All other systems reviewed and are  negative.     Allergies  Sulfa antibiotics and Sulfonamide derivatives  Home Medications   Prior to Admission medications   Medication Sig Start Date End Date Taking? Authorizing Provider  cetirizine (ZYRTEC) 10 MG tablet Take 10 mg by mouth daily.    Historical Provider, MD  Christiane Ha Oil (CHIA SEED OIL EXTRACT PO) Take 1 each by mouth 4 (four) times a week.    Historical Provider, MD  cholecalciferol (VITAMIN D) 1000 UNITS tablet Take 1,000 Units by mouth every morning.     Historical Provider, MD  diltiazem (CARDIZEM CD) 240 MG 24 hr capsule Take 240 mg by mouth every morning.     Historical Provider, MD  esomeprazole (NEXIUM) 20 MG capsule Take 20 mg by mouth daily at 12 noon.    Historical Provider, MD  Flaxseed, Linseed, (FLAX SEEDS PO) Take 1 each by mouth 4 (four) times a week.    Historical Provider, MD  fluticasone (FLONASE) 50 MCG/ACT nasal spray Place 2 sprays into both nostrils daily as needed for allergies or rhinitis.    Historical Provider, MD  latanoprost (XALATAN) 0.005 % ophthalmic solution Place 1 drop into both eyes at bedtime.     Historical Provider, MD  levothyroxine (SYNTHROID, LEVOTHROID) 50 MCG tablet Take 1 tablet (50 mcg total) by mouth daily. 11/07/14   Lucretia Kern, DO  lidocaine-prilocaine (EMLA) cream Apply to affected area once 11/16/14   Nicholas Lose, MD  LORazepam (ATIVAN) 0.5 MG tablet Take 1 tablet (0.5 mg total) by mouth every 6 (six) hours as needed (Nausea or vomiting). 11/16/14   Nicholas Lose, MD  losartan-hydrochlorothiazide (HYZAAR) 50-12.5 MG per tablet Take 1 tablet by mouth daily. 09/22/14   Lucretia Kern, DO  metFORMIN (GLUCOPHAGE) 500 MG tablet Take 2 tablets (1,000 mg total) by mouth 2 (two) times daily with a meal. 11/07/14   Lucretia Kern, DO  naproxen sodium (ANAPROX) 220 MG tablet Take 440 mg by mouth daily as needed (pain).    Historical Provider, MD  NON FORMULARY Apply 1 application topically daily as needed (Applies to feet for athlete's foot.).  Fungifoam    Historical Provider, MD  ondansetron (ZOFRAN) 8 MG tablet Take 1 tablet (8 mg total) by mouth 2 (two) times daily. Start the day after chemo for 2 days. Then take as needed for nausea or vomiting. 11/16/14   Nicholas Lose, MD  oxyCODONE (OXY IR/ROXICODONE) 5 MG immediate release tablet Take 1 tablet (5 mg total) by mouth every 4 (four) hours as needed for moderate pain. 10/31/14   Robbie Lis, MD  potassium chloride (KLOR-CON) 20 MEQ packet Take 20 mEq by mouth 2 (two) times daily.    Historical Provider, MD  prochlorperazine (  COMPAZINE) 10 MG tablet Take 1 tablet (10 mg total) by mouth every 6 (six) hours as needed (Nausea or vomiting). 11/16/14   Nicholas Lose, MD  Tetrahydrozoline HCl (VISINE OP) Apply 1-2 drops to eye daily as needed (itchy eyes.).    Historical Provider, MD   BP 131/76 mmHg  Pulse 112  Temp(Src) 98.1 F (36.7 C)  Resp 22  Wt 203 lb 4 oz (92.194 kg)  SpO2 96% Physical Exam  Constitutional: She is oriented to person, place, and time. She appears well-developed and well-nourished. No distress.  HENT:  Head: Normocephalic and atraumatic.  Neck: Normal range of motion. Neck supple.  Cardiovascular: Normal rate and regular rhythm.  Exam reveals no gallop and no friction rub.   No murmur heard. Pulmonary/Chest: Effort normal and breath sounds normal. No respiratory distress. She has no wheezes.  Breath sounds are diminished on the right.  Abdominal: Soft. Bowel sounds are normal. She exhibits no distension. There is no tenderness.  Musculoskeletal: Normal range of motion.  Neurological: She is alert and oriented to person, place, and time.  Skin: Skin is warm and dry. She is not diaphoretic.  Nursing note and vitals reviewed.   ED Course  Procedures (including critical care time) Labs Review Labs Reviewed  COMPREHENSIVE METABOLIC PANEL  CBC WITH DIFFERENTIAL/PLATELET  PROTIME-INR    Imaging Review No results found.   EKG Interpretation None       MDM   Final diagnoses:  None    Patient is a 59 year old female with history of metastatic breast cancer. She presents with shortness of breath that appears related to a recurrent pleural effusion. I've spoken with interventional radiology (Dr. Earleen Newport) who recommends admission to the hospitalist service. He will perform a ultrasound-guided paracentesis tomorrow. She has an oxygen requirement I do feel as though admission is indicated.    Veryl Speak, MD 12/01/14 2049

## 2014-12-01 NOTE — ED Notes (Signed)
Interventional radiology paged

## 2014-12-02 ENCOUNTER — Inpatient Hospital Stay (HOSPITAL_COMMUNITY): Payer: BLUE CROSS/BLUE SHIELD

## 2014-12-02 ENCOUNTER — Observation Stay (HOSPITAL_COMMUNITY): Payer: BLUE CROSS/BLUE SHIELD

## 2014-12-02 DIAGNOSIS — R0602 Shortness of breath: Secondary | ICD-10-CM

## 2014-12-02 LAB — LACTATE DEHYDROGENASE, PLEURAL OR PERITONEAL FLUID: LD, Fluid: 507 U/L — ABNORMAL HIGH (ref 3–23)

## 2014-12-02 LAB — CBC
HEMATOCRIT: 39.8 % (ref 36.0–46.0)
Hemoglobin: 13.2 g/dL (ref 12.0–15.0)
MCH: 30.5 pg (ref 26.0–34.0)
MCHC: 33.2 g/dL (ref 30.0–36.0)
MCV: 91.9 fL (ref 78.0–100.0)
Platelets: 243 10*3/uL (ref 150–400)
RBC: 4.33 MIL/uL (ref 3.87–5.11)
RDW: 13.4 % (ref 11.5–15.5)
WBC: 5.4 10*3/uL (ref 4.0–10.5)

## 2014-12-02 LAB — COMPREHENSIVE METABOLIC PANEL
ALK PHOS: 76 U/L (ref 39–117)
ALT: 19 U/L (ref 0–35)
AST: 28 U/L (ref 0–37)
Albumin: 3.8 g/dL (ref 3.5–5.2)
Anion gap: 19 — ABNORMAL HIGH (ref 5–15)
BUN: 12 mg/dL (ref 6–23)
CO2: 23 mmol/L (ref 19–32)
Calcium: 9 mg/dL (ref 8.4–10.5)
Chloride: 101 mmol/L (ref 96–112)
Creatinine, Ser: 1.06 mg/dL (ref 0.50–1.10)
GFR, EST AFRICAN AMERICAN: 66 mL/min — AB (ref >90)
GFR, EST NON AFRICAN AMERICAN: 57 mL/min — AB (ref >90)
GLUCOSE: 127 mg/dL — AB (ref 70–99)
POTASSIUM: 3.6 mmol/L (ref 3.5–5.1)
Sodium: 143 mmol/L (ref 135–145)
Total Bilirubin: 0.5 mg/dL (ref 0.3–1.2)
Total Protein: 7.2 g/dL (ref 6.0–8.3)

## 2014-12-02 LAB — GLUCOSE, CAPILLARY
GLUCOSE-CAPILLARY: 123 mg/dL — AB (ref 70–99)
Glucose-Capillary: 116 mg/dL — ABNORMAL HIGH (ref 70–99)
Glucose-Capillary: 98 mg/dL (ref 70–99)
Glucose-Capillary: 128 mg/dL — ABNORMAL HIGH (ref 70–99)

## 2014-12-02 LAB — BODY FLUID CELL COUNT WITH DIFFERENTIAL
EOS FL: 3 %
Lymphs, Fluid: 84 %
MONOCYTE-MACROPHAGE-SEROUS FLUID: 4 % — AB (ref 50–90)
Neutrophil Count, Fluid: 9 % (ref 0–25)
WBC FLUID: 6605 uL — AB (ref 0–1000)

## 2014-12-02 LAB — PROTEIN, BODY FLUID: TOTAL PROTEIN, FLUID: 4.5 g/dL

## 2014-12-02 LAB — LACTATE DEHYDROGENASE: LDH: 275 U/L — ABNORMAL HIGH (ref 94–250)

## 2014-12-02 MED ORDER — LIDOCAINE HCL (PF) 1 % IJ SOLN
INTRAMUSCULAR | Status: AC
  Filled 2014-12-02: qty 10

## 2014-12-02 MED ORDER — ENOXAPARIN SODIUM 40 MG/0.4ML ~~LOC~~ SOLN
40.0000 mg | SUBCUTANEOUS | Status: DC
  Filled 2014-12-02 (×2): qty 0.4

## 2014-12-02 NOTE — Progress Notes (Signed)
PROGRESS NOTE  Christine [name] WNI:[id number] DOB: 08/14/1955 DOA: 12/01/2014 PCP: Lucretia Kern., DO  Assessment/Plan: R pleural effusion NPO after mn IR to do therapeutic thoracentesis  Hyperglycemia hga1c  Hypokalemia Replete Check cmp  Hypothyroidism Check tsh  Breast cancer Stable, F/u with oncology   Code Status:full Family Communication:  patient Disposition Plan:    Consultants:  IR  Procedures:      HPI/Subjective: Cough started last PM Hoping that chemotherapy will reduce the need for thoracentesis  Objective: Filed Vitals:   12/02/14 0523  BP: 96/64  Pulse: 93  Temp:   Resp:     Intake/Output Summary (Last 24 hours) at 12/02/14 0756 Last data filed at 12/02/14 0450  Gross per 24 hour  Intake    150 ml  Output    100 ml  Net     50 ml   Filed Weights   12/01/14 1723 12/01/14 2240 12/02/14 0523  Weight: 92.194 kg (203 lb 4 oz) 95.437 kg (210 lb 6.4 oz) 94.62 kg (208 lb 9.6 oz)    Exam:   General:  A+Ox3, NAD  Cardiovascular: rrr  Respiratory: diminished at bases  Abdomen: +BS, soft  Musculoskeletal: pulses intact  Data Reviewed: Basic Metabolic Panel:  Recent Labs Lab 11/28/14 1332 12/01/14 1817  NA 143 141  K 3.7 3.0*  CL  --  100  CO2 21* 29  GLUCOSE 113 128*  BUN 11.9 12  CREATININE 1.0 1.02  CALCIUM 9.8 8.9   Liver Function Tests:  Recent Labs Lab 11/28/14 1332 12/01/14 1817  AST 23 29  ALT 13 17  ALKPHOS 83 74  BILITOT 0.45 0.7  PROT 7.0 6.7  ALBUMIN 3.7 3.7   No results for input(s): LIPASE, AMYLASE in the last 168 hours. No results for input(s): AMMONIA in the last 168 hours. CBC:  Recent Labs Lab 11/28/14 1329 12/02/14 0532  WBC 5.7 5.4  NEUTROABS 3.8  --   HGB 12.8 13.2  HCT 38.3 39.8  MCV 91.4 91.9  PLT 246 243   Cardiac Enzymes: No results for input(s): CKTOTAL, CKMB, CKMBINDEX, TROPONINI in the last 168 hours. BNP (last 3 results)  Recent Labs  10/30/14 1300  BNP 55.3     ProBNP (last 3 results) No results for input(s): PROBNP in the last 8760 hours.  CBG:  Recent Labs Lab 11/27/14 0747 12/01/14 2220  GLUCAP 122* 116*    No results found for this or any previous visit (from the past 240 hour(s)).   Studies: Dg Chest 2 View  12/01/2014   CLINICAL DATA:  Short of breath. Thoracentesis 2 weeks ago. Breast cancer with lung metastasis.  EXAM: CHEST  2 VIEW  COMPARISON:  11/17/2014  FINDINGS: Right Port-A-Cath which terminates at the low SVC. Mild tracheal deviation left. Mild cardiomegaly. Increase in moderate to large right pleural effusion. No pneumothorax. Pulmonary metastasis again identified. Worsened left-sided aeration with airspace disease throughout the right mid and lower lung. Minimal volume loss at the left lung base.  IMPRESSION: Worsened right-sided aeration due to increased pleural fluid and adjacent atelectasis or infection.  Pulmonary metastasis, as before.   Electronically Signed   By: Abigail Miyamoto M.D.   On: 12/01/2014 20:42    Scheduled Meds: . antiseptic oral rinse  7 mL Mouth Rinse q12n4p  . chlorhexidine  15 mL Mouth Rinse BID  . cholecalciferol  1,000 Units Oral q morning - 10a  . diltiazem  240 mg Oral q morning - 10a  .  enoxaparin (LOVENOX) injection  30 mg Subcutaneous Q24H  . losartan  50 mg Oral Daily   And  . hydrochlorothiazide  12.5 mg Oral Daily  . insulin aspart  0-5 Units Subcutaneous QHS  . insulin aspart  0-9 Units Subcutaneous TID WC  . latanoprost  1 drop Both Eyes QHS  . levothyroxine  50 mcg Oral QAC breakfast  . loratadine  10 mg Oral Daily  . metFORMIN  1,000 mg Oral BID WC  . pantoprazole  40 mg Oral Daily  . potassium chloride  20 mEq Oral BID  . sodium chloride  3 mL Intravenous Q12H  . sodium chloride  3 mL Intravenous Q12H   Continuous Infusions:  Antibiotics Given (last 72 hours)    None      Active Problems:   Hypothyroidism   Impaired fasting glucose   Hypokalemia   Recurrent right  pleural effusion   Pleural effusion    Time spent: 25 min    Berley Gambrell, [place] Pager [phone]. If 7PM-7AM, please contact night-coverage at [email or web address], password [facility] 12/02/2014, 7:56 AM  LOS: 1 day

## 2014-12-02 NOTE — Procedures (Signed)
Successful US guided right thoracentesis. Yielded 1.2 liters of amber colored fluid. Pt tolerated procedure well. No immediate complications.  Specimen was sent for labs. CXR ordered.  Tsosie Billing D PA-C 12/02/2014 11:48 AM

## 2014-12-03 ENCOUNTER — Inpatient Hospital Stay (HOSPITAL_COMMUNITY): Payer: BLUE CROSS/BLUE SHIELD

## 2014-12-03 DIAGNOSIS — C799 Secondary malignant neoplasm of unspecified site: Secondary | ICD-10-CM

## 2014-12-03 HISTORY — PX: OTHER SURGICAL HISTORY: SHX169

## 2014-12-03 LAB — BASIC METABOLIC PANEL
Anion gap: 10 (ref 5–15)
BUN: 14 mg/dL (ref 6–20)
CALCIUM: 8.5 mg/dL — AB (ref 8.9–10.3)
CO2: 29 mmol/L (ref 22–32)
CREATININE: 1.1 mg/dL — AB (ref 0.44–1.00)
Chloride: 101 mmol/L (ref 101–111)
GFR calc Af Amer: >60 mL/min (ref >60)
GFR calc non Af Amer: 54 mL/min — ABNORMAL LOW (ref >60)
Glucose, Bld: 118 mg/dL — ABNORMAL HIGH (ref 70–99)
Potassium: 3.7 mmol/L (ref 3.5–5.1)
Sodium: 140 mmol/L (ref 135–145)

## 2014-12-03 LAB — CBC
HCT: 36.4 % (ref 36.0–46.0)
Hemoglobin: 11.7 g/dL — ABNORMAL LOW (ref 12.0–15.0)
MCH: 30.2 pg (ref 26.0–34.0)
MCHC: 32.1 g/dL (ref 30.0–36.0)
MCV: 93.8 fL (ref 78.0–100.0)
PLATELETS: 221 10*3/uL (ref 150–400)
RBC: 3.88 MIL/uL (ref 3.87–5.11)
RDW: 13.7 % (ref 11.5–15.5)
WBC: 5.1 10*3/uL (ref 4.0–10.5)

## 2014-12-03 LAB — GLUCOSE, CAPILLARY
Glucose-Capillary: 111 mg/dL — ABNORMAL HIGH (ref 70–99)
Glucose-Capillary: 113 mg/dL — ABNORMAL HIGH (ref 70–99)

## 2014-12-03 MED ORDER — DILTIAZEM HCL ER COATED BEADS 120 MG PO CP24: 120.0000 mg | ORAL_CAPSULE | Freq: Every morning | ORAL | Status: DC

## 2014-12-03 MED ORDER — DILTIAZEM HCL ER COATED BEADS 120 MG PO CP24
120.0000 mg | ORAL_CAPSULE | Freq: Every morning | ORAL | Status: DC
  Administered 2014-12-03: 120 mg via ORAL
  Filled 2014-12-03: qty 1

## 2014-12-03 NOTE — Progress Notes (Signed)
CM called [facility] DME rep, Jeneen Rinks to please deliver 02 to room so pt can discharge today.  No other CM needs were communicated.

## 2014-12-03 NOTE — Progress Notes (Signed)
PROGRESS NOTE  Christine [name] NLG:[id number] DOB: May 27, 1956 DOA: 12/01/2014 PCP: Lucretia Kern., DO  Assessment/Plan: R pleural effusion due to metastasis s/p therapeutic thoracentesis -recheck x ray today- to see how fast fluid is re-accumulating -home O2 eval  Hypokalemia Replete  Hypothyroidism -continue home meds  Breast cancer Stable, F/u with oncology   Code Status:full Family Communication:  Patient/husband at bedside Disposition Plan: ? D/c if fluid not reaccumulated   Consultants:  IR  Procedures:      HPI/Subjective: Cough better but not resolved Feels as if she is wheezing  Objective: Filed Vitals:   12/03/14 0552  BP: 94/48  Pulse: 77  Temp: 97.8 F (36.6 C)  Resp: 18    Intake/Output Summary (Last 24 hours) at 12/03/14 0822 Last data filed at 12/03/14 0550  Gross per 24 hour  Intake    150 ml  Output    375 ml  Net   -225 ml   Filed Weights   12/01/14 2240 12/02/14 0523 12/03/14 0547  Weight: 95.437 kg (210 lb 6.4 oz) 94.62 kg (208 lb 9.6 oz) 96.344 kg (212 lb 6.4 oz)    Exam:   General:  A+Ox3, NAD  Cardiovascular: rrr  Respiratory: diminished right base  Abdomen: +BS, soft  Musculoskeletal: pulses intact  Data Reviewed: Basic Metabolic Panel:  Recent Labs Lab 11/28/14 1332 12/01/14 1817 12/02/14 0532  NA 143 141 143  K 3.7 3.0* 3.6  CL  --  100 101  CO2 21* 29 23  GLUCOSE 113 128* 127*  BUN 11.9 12 12   CREATININE 1.0 1.02 1.06  CALCIUM 9.8 8.9 9.0   Liver Function Tests:  Recent Labs Lab 11/28/14 1332 12/01/14 1817 12/02/14 0532  AST 23 29 28   ALT 13 17 19   ALKPHOS 83 74 76  BILITOT 0.45 0.7 0.5  PROT 7.0 6.7 7.2  ALBUMIN 3.7 3.7 3.8   No results for input(s): LIPASE, AMYLASE in the last 168 hours. No results for input(s): AMMONIA in the last 168 hours. CBC:  Recent Labs Lab 11/28/14 1329 12/02/14 0532  WBC 5.7 5.4  NEUTROABS 3.8  --   HGB 12.8 13.2  HCT 38.3 39.8  MCV 91.4 91.9  PLT  246 243   Cardiac Enzymes: No results for input(s): CKTOTAL, CKMB, CKMBINDEX, TROPONINI in the last 168 hours. BNP (last 3 results)  Recent Labs  10/30/14 1300  BNP 55.3    ProBNP (last 3 results) No results for input(s): PROBNP in the last 8760 hours.  CBG:  Recent Labs Lab 12/01/14 2220 12/02/14 0807 12/02/14 1219 12/02/14 1734 12/02/14 2149  GLUCAP 116* 116* 98 128* 123*    Recent Results (from the past 240 hour(s))  Body fluid culture     Status: None (Preliminary result)   Collection Time: 12/02/14 11:56 AM  Result Value Ref Range Status   Specimen Description PLEURAL RIGHT  Final   Special Requests NONE  Final   Gram Stain   Final    FEW WBC PRESENT, PREDOMINANTLY MONONUCLEAR NO ORGANISMS SEEN Performed at [facility]    Culture PENDING  Incomplete   Report Status PENDING  Incomplete     Studies: Dg Chest 1 View  12/02/2014   CLINICAL DATA:  Status post right thoracentesis.  EXAM: CHEST  1 VIEW  COMPARISON:  12/01/2014  FINDINGS: Patient has undergone right thoracentesis. Right pleural effusion is significantly smaller. There is persistent small right pleural effusion and right basilar opacity which obscures the hemidiaphragm. No pneumothorax  identified. Right-sided Port-A-Cath is in place, tip overlying the level of the upper right atrium. Numerous pulmonary lesions are again noted.  IMPRESSION: 1. Smaller right pleural effusion. 2. No pneumothorax.   Electronically Signed   By: Nolon Nations M.D.   On: 12/02/2014 12:57   Dg Chest 2 View  12/01/2014   CLINICAL DATA:  Short of breath. Thoracentesis 2 weeks ago. Breast cancer with lung metastasis.  EXAM: CHEST  2 VIEW  COMPARISON:  11/17/2014  FINDINGS: Right Port-A-Cath which terminates at the low SVC. Mild tracheal deviation left. Mild cardiomegaly. Increase in moderate to large right pleural effusion. No pneumothorax. Pulmonary metastasis again identified. Worsened left-sided aeration with airspace  disease throughout the right mid and lower lung. Minimal volume loss at the left lung base.  IMPRESSION: Worsened right-sided aeration due to increased pleural fluid and adjacent atelectasis or infection.  Pulmonary metastasis, as before.   Electronically Signed   By: Abigail Miyamoto M.D.   On: 12/01/2014 20:42   US Thoracentesis Asp Pleural Space W/img Guide  12/02/2014   INDICATION: Symptomatic recurrent right sided pleural effusion  EXAM: US THORACENTESIS ASP PLEURAL SPACE W/IMG GUIDE  COMPARISON:  CXR 12/01/14.  MEDICATIONS: None  COMPLICATIONS: None immediate  TECHNIQUE: Informed written consent was obtained from the patient after a discussion of the risks, benefits and alternatives to treatment. A timeout was performed prior to the initiation of the procedure.  Initial ultrasound scanning demonstrates a right pleural effusion. The lower chest was prepped and draped in the usual sterile fashion. 1% lidocaine was used for local anesthesia.  An ultrasound image was saved for documentation purposes. A 6 Fr Safe-T-Centesis catheter was introduced. The thoracentesis was performed. The catheter was removed and a dressing was applied. The patient tolerated the procedure well without immediate post procedural complication. The patient was escorted to have an upright chest radiograph.  FINDINGS: A total of approximately 1.2 liters of amber colored fluid was removed. Requested samples were sent to the laboratory.  IMPRESSION: Successful ultrasound-guided right sided thoracentesis yielding 1.2 liters of pleural fluid.  Read By:  Tsosie Billing PA-C   Electronically Signed   By: Corrie Mckusick D.O.   On: 12/02/2014 13:39    Scheduled Meds: . antiseptic oral rinse  7 mL Mouth Rinse q12n4p  . chlorhexidine  15 mL Mouth Rinse BID  . cholecalciferol  1,000 Units Oral q morning - 10a  . diltiazem  240 mg Oral q morning - 10a  . enoxaparin (LOVENOX) injection  40 mg Subcutaneous Q24H  . insulin aspart  0-5 Units  Subcutaneous QHS  . insulin aspart  0-9 Units Subcutaneous TID WC  . latanoprost  1 drop Both Eyes QHS  . levothyroxine  50 mcg Oral QAC breakfast  . loratadine  10 mg Oral Daily  . metFORMIN  1,000 mg Oral BID WC  . pantoprazole  40 mg Oral Daily  . potassium chloride  20 mEq Oral BID  . sodium chloride  3 mL Intravenous Q12H  . sodium chloride  3 mL Intravenous Q12H   Continuous Infusions:  Antibiotics Given (last 72 hours)    None      Active Problems:   Hypothyroidism   Impaired fasting glucose   Hypokalemia   Recurrent right pleural effusion   Pleural effusion    Time spent: 25 min    Christine [name], [place] [facility] Pager [phone]. If 7PM-7AM, please contact night-coverage at [email or web address], password [facility] 12/03/2014, 8:22 AM  LOS: 2 days

## 2014-12-03 NOTE — Progress Notes (Signed)
SATURATION QUALIFICATIONS: (This note is used to comply with regulatory documentation for home oxygen)  Patient Saturations on Room Air at Rest = 96%  Patient Saturations on Room Air while Ambulating = 86%  Patient Saturations on 2 Liters of oxygen while Ambulating = 94%  Please briefly explain why patient needs home oxygen: desats on exertion

## 2014-12-03 NOTE — Discharge Summary (Signed)
Physician Discharge Summary  Christine [name] OQH:[id number] DOB: 1955-10-19 DOA: 12/01/2014  PCP: Colin Benton R., DO  Admit date: 12/01/2014 Discharge date: 12/03/2014  Time spent: 35 minutes  Recommendations for Outpatient Follow-up:  1. May need repeat thoracentesis 2. Home O2 3. BP low- d/c'd HCTZ/sartan combo, decrease cardizem dose  Discharge Diagnoses:  Active Problems:   Hypothyroidism   Impaired fasting glucose   Hypokalemia   Recurrent right pleural effusion   Pleural effusion   Discharge Condition: improved  Diet recommendation: cardiac  Filed Weights   12/01/14 2240 12/02/14 0523 12/03/14 0547  Weight: 95.437 kg (210 lb 6.4 oz) 94.62 kg (208 lb 9.6 oz) 96.344 kg (212 lb 6.4 oz)    History of present illness:  59 yo female with hx of breast cancer apparently c/o sob, Presented to ED for evaluation. Dry cough. Pt notes that she had thoracentesis Recently. Denies fever, chills, cp, palp, n/v, diarrhea. CXR showed right pleural efffusion. Pt will be admitted for therapeutic thoracentesis.   Hospital Course:  R pleural effusion due to metastasis s/p therapeutic thoracentesis -home O2 for comfort  Hypokalemia Replete  Hypothyroidism -continue home meds  Breast cancer Stable, F/u with oncology  HTN -BP low D/c HCTZ combo Decreased cardizem  Procedures: thoracentesis  Consultations:  IR  Discharge Exam: Filed Vitals:   12/03/14 1053  BP: 102/57  Pulse:   Temp:   Resp:       Discharge Instructions   Discharge Instructions    Diet Carb Modified    Complete by:  As directed      Discharge instructions    Complete by:  As directed   Home O2 Keep appointment with Dr. Lindi Adie     Increase activity slowly    Complete by:  As directed           Current Discharge Medication List    CONTINUE these medications which have CHANGED   Details  diltiazem (CARDIZEM CD) 120 MG 24 hr capsule Take 1 capsule (120 mg total) by mouth every  morning. Qty: 30 capsule, Refills: 0      CONTINUE these medications which have NOT CHANGED   Details  cetirizine (ZYRTEC) 10 MG tablet Take 10 mg by mouth daily.    Chia Oil (CHIA SEED OIL EXTRACT PO) Take 1 each by mouth 4 (four) times a week.    cholecalciferol (VITAMIN D) 1000 UNITS tablet Take 1,000 Units by mouth every morning.     esomeprazole (NEXIUM) 20 MG capsule Take 20 mg by mouth daily at 12 noon.    Flaxseed, Linseed, (FLAX SEEDS PO) Take 1 each by mouth 4 (four) times a week.    fluticasone (FLONASE) 50 MCG/ACT nasal spray Place 2 sprays into both nostrils daily as needed for allergies or rhinitis.    latanoprost (XALATAN) 0.005 % ophthalmic solution Place 1 drop into both eyes at bedtime.     levothyroxine (SYNTHROID, LEVOTHROID) 50 MCG tablet Take 1 tablet (50 mcg total) by mouth daily. Qty: 90 tablet, Refills: 3    metFORMIN (GLUCOPHAGE) 500 MG tablet Take 2 tablets (1,000 mg total) by mouth 2 (two) times daily with a meal. Qty: 120 tablet, Refills: 1    naproxen sodium (ANAPROX) 220 MG tablet Take 440 mg by mouth daily as needed (pain).    NON FORMULARY Apply 1 application topically daily as needed (Applies to feet for athlete's foot.). Fungifoam    oxyCODONE (OXY IR/ROXICODONE) 5 MG immediate release tablet Take 1 tablet (5 mg  total) by mouth every 4 (four) hours as needed for moderate pain. Qty: 30 tablet, Refills: 0    Tetrahydrozoline HCl (VISINE OP) Apply 1-2 drops to eye daily as needed (itchy eyes.).    lidocaine-prilocaine (EMLA) cream Apply to affected area once Qty: 30 g, Refills: 3   Associated Diagnoses: Bilateral breast cancer; Breast cancer of upper-outer quadrant of left female breast    LORazepam (ATIVAN) 0.5 MG tablet Take 1 tablet (0.5 mg total) by mouth every 6 (six) hours as needed (Nausea or vomiting). Qty: 30 tablet, Refills: 0   Associated Diagnoses: Bilateral breast cancer; Breast cancer of upper-outer quadrant of left female breast     ondansetron (ZOFRAN) 8 MG tablet Take 1 tablet (8 mg total) by mouth 2 (two) times daily. Start the day after chemo for 2 days. Then take as needed for nausea or vomiting. Qty: 30 tablet, Refills: 1   Associated Diagnoses: Bilateral breast cancer; Breast cancer of upper-outer quadrant of left female breast    prochlorperazine (COMPAZINE) 10 MG tablet Take 1 tablet (10 mg total) by mouth every 6 (six) hours as needed (Nausea or vomiting). Qty: 30 tablet, Refills: 1   Associated Diagnoses: Bilateral breast cancer; Breast cancer of upper-outer quadrant of left female breast      STOP taking these medications     losartan-hydrochlorothiazide (HYZAAR) 50-12.5 MG per tablet      potassium chloride (KLOR-CON) 20 MEQ packet        Allergies  Allergen Reactions  . Sulfa Antibiotics Hives, Itching and Swelling  . Sulfonamide Derivatives Hives, Itching and Swelling      The results of significant diagnostics from this hospitalization (including imaging, microbiology, ancillary and laboratory) are listed below for reference.    Significant Diagnostic Studies: Dg Chest 1 View  12/02/2014   CLINICAL DATA:  Status post right thoracentesis.  EXAM: CHEST  1 VIEW  COMPARISON:  12/01/2014  FINDINGS: Patient has undergone right thoracentesis. Right pleural effusion is significantly smaller. There is persistent small right pleural effusion and right basilar opacity which obscures the hemidiaphragm. No pneumothorax identified. Right-sided Port-A-Cath is in place, tip overlying the level of the upper right atrium. Numerous pulmonary lesions are again noted.  IMPRESSION: 1. Smaller right pleural effusion. 2. No pneumothorax.   Electronically Signed   By: Nolon Nations M.D.   On: 12/02/2014 12:57   Dg Chest 1 View  11/17/2014   CLINICAL DATA:  Status post right thoracentesis. Metastatic breast carcinoma.  EXAM: CHEST  1 VIEW  COMPARISON:  11/15/2014  FINDINGS: Small right pleural effusion is  significantly decreased in size since previous study. Decreased atelectasis also noted in right lung base. No pneumothorax visualized.  Bilateral pulmonary metastases again noted. Heart size remains within normal limits. Surgical clips again seen in both axillary regions.  IMPRESSION: Significant decrease in size of right pleural effusion and basilar atelectasis status post thoracentesis. No pneumothorax visualized.  Diffuse bilateral pulmonary metastases again noted.   Electronically Signed   By: Earle Gell M.D.   On: 11/17/2014 11:23   Dg Chest 2 View  12/01/2014   CLINICAL DATA:  Short of breath. Thoracentesis 2 weeks ago. Breast cancer with lung metastasis.  EXAM: CHEST  2 VIEW  COMPARISON:  11/17/2014  FINDINGS: Right Port-A-Cath which terminates at the low SVC. Mild tracheal deviation left. Mild cardiomegaly. Increase in moderate to large right pleural effusion. No pneumothorax. Pulmonary metastasis again identified. Worsened left-sided aeration with airspace disease throughout the right mid and lower  lung. Minimal volume loss at the left lung base.  IMPRESSION: Worsened right-sided aeration due to increased pleural fluid and adjacent atelectasis or infection.  Pulmonary metastasis, as before.   Electronically Signed   By: Abigail Miyamoto M.D.   On: 12/01/2014 20:42   Dg Chest 2 View  11/15/2014   CLINICAL DATA:  Bilateral breast cancer 10/31/2014  EXAM: CHEST  2 VIEW  COMPARISON:  10/31/2014  FINDINGS: Cardiomediastinal silhouette is stable. Surgical clips are noted bilateral axilla. Bilateral pulmonary nodules are noted consistent with metastatic disease. There is small right pleural effusion with right basilar atelectasis or infiltrate.  IMPRESSION: Bilateral pulmonary nodules again noted. Small right pleural effusion with right basilar atelectasis or infiltrate.   Electronically Signed   By: Lahoma Crocker M.D.   On: 11/15/2014 10:42   Ct Angio Chest Pe W/cm &/or Wo Cm  11/15/2014   CLINICAL DATA:  Two  week history of shortness of breath. Known metastatic breast cancer.  EXAM: CT ANGIOGRAPHY CHEST WITH CONTRAST  TECHNIQUE: Multidetector CT imaging of the chest was performed using the standard protocol during bolus administration of intravenous contrast. Multiplanar CT image reconstructions and MIPs were obtained to evaluate the vascular anatomy.  CONTRAST:  72mL OMNIPAQUE IOHEXOL 350 MG/ML SOLN  COMPARISON:  10/30/2014  FINDINGS: Chest wall: Stable surgical changes related to bilateral mastectomies. There is a large stable postoperative fluid collection involving the left lower chest wall. Persistent skin thickening without obvious mass. No axillary or supraclavicular adenopathy. The bony thorax is intact. No destructive bone lesions or spinal canal compromise. No obvious bony metastasis.  Mediastinum: The heart is normal in size. No pericardial effusion. Interval progression of right-sided mediastinal and hilar adenopathy. The aorta is normal in caliber. No dissection. The esophagus is grossly normal.  The pulmonary arterial tree is fairly well opacified. No definite filling defects to suggest pulmonary embolism.  Lungs/ pleura: Enlarging right-sided pleural effusion without obvious pleural nodules. There is progressive right middle lobe and right lower lobe compressive atelectasis and a enlarging perifissural fluid collection in the right lower lobe. Knee metastatic pulmonary nodules appear relatively stable. The large right lower lobe mass is partially obscured by fluid and atelectasis.  Upper abdomen: The posterior margin of the right hepatic lobe is very irregular. Could not exclude peritoneal surface disease possibly extending down from the chest. No intrahepatic lesions are identified.  Review of the MIP images confirms the above findings.  IMPRESSION: 1. No CT findings for pulmonary embolism. 2. Enlarging right pleural effusion with progressive overlying atelectasis. 3. Progression of mediastinal and right  hilar adenopathy. 4. Relatively stable pulmonary metastatic disease. 5. Possible peritoneal surface disease involving the liver.   Electronically Signed   By: Marijo Sanes M.D.   On: 11/15/2014 15:15   Nm Pet Image Restag (ps) Skull Base To Thigh  11/27/2014   CLINICAL DATA:  Subsequent treatment strategy for metastatic breast cancer. Restaging examination.  EXAM: NUCLEAR MEDICINE PET SKULL BASE TO THIGH  TECHNIQUE: 10.5 mCi F-18 FDG was injected intravenously. Full-ring PET imaging was performed from the skull base to thigh after the radiotracer. CT data was obtained and used for attenuation correction and anatomic localization.  FASTING BLOOD GLUCOSE:  Value: 122 mg/dl  COMPARISON:  CT of the chest, abdomen and pelvis 10/30/2014. PET-CT 10/27/2012.  FINDINGS: NECK  No hypermetabolic lymph nodes in the neck.  CHEST  Numerous hypermetabolic nodules and masses are noted scattered throughout the lungs bilaterally, compatible with widespread metastatic disease. The largest conglomerate  of hypermetabolism is in the right middle lobe with where there is a hypermetabolic (SUVmax = 32.4) mass estimated to measure approximately 3.5 x 4.6 cm (image 85 of series 4). Multiple other smaller pulmonary nodules are also noted, also or hypermetabolic. Large right-sided pleural effusion with extensive right pleural hypermetabolism (SUVmax = 3.8-4.3), compatible with a malignant pleural effusion. This is associated with extensive passive atelectasis in the right lung, particularly in the right lower lobe. Extensive right hilar and mediastinal hypermetabolic lymphadenopathy, difficult to measure on today's noncontrast CT examination. Postoperative changes of left-sided modified radical mastectomy and left axillary nodal dissection are noted, with what appears to be a chronic postoperative fluid collection in the left chest wall, similar to the prior examination, presumably a large seroma. No significant hypermetabolism is noted to  suggest local recurrence of disease. Right internal jugular single-lumen porta cath with tip terminating in the right atrium.  ABDOMEN/PELVIS  No abnormal hypermetabolic activity within the liver, pancreas, adrenal glands, or spleen. No hypermetabolic lymph nodes in the abdomen or pelvis. 3 cm partially calcified gallstone in the gallbladder. No current findings to suggest an acute cholecystitis at this time. No significant volume of ascites. No pneumoperitoneum. No pathologic distention of small bowel or colon. Numerous colonic diverticulae are noted, without surrounding inflammatory changes to suggest an acute diverticulitis at this time. Status post hysterectomy. Ovaries are unremarkable in appearance.  SKELETON  No focal hypermetabolic activity to suggest skeletal metastasis.  IMPRESSION: 1. Widespread metastatic disease to the thorax, as evidenced by numerous pulmonary nodules and masses, a malignant right pleural effusion, and right hilar and mediastinal malignant lymphadenopathy, as detailed above. 2. No definite signs of other metastatic disease in the neck, abdomen or pelvis. 3. No definite skeletal metastases. 4. Cholelithiasis without evidence of acute cholecystitis. 5. Colonic diverticulosis without findings to suggest acute diverticulitis at this time. 6. Additional incidental findings, as above.   Electronically Signed   By: Vinnie Langton M.D.   On: 11/27/2014 09:46   Ir Fluoro Guide Cv Line Right  11/23/2014   CLINICAL DATA:  Recurrent metastatic breast carcinoma, needs venous access for chemotherapy  EXAM: TUNNELED PORT CATHETER PLACEMENT WITH ULTRASOUND AND FLUOROSCOPIC GUIDANCE  FLUOROSCOPY TIME:  1.1 minutes, 17.8 mGy  ANESTHESIA/SEDATION: Intravenous Fentanyl and Versed were administered as conscious sedation during continuous cardiorespiratory monitoring by the radiology RN, with a total moderate sedation time of less than 30 minutes.  TECHNIQUE: The procedure, risksbenefits, and  alternatives were explained to the patient. Questions regarding the procedure were encouraged and answered. The patient understands and consents to the procedure. As antibiotic prophylaxis, cefazolin was ordered pre-procedure and administered intravenously within one hour of incision., Patency of the right IJ vein was confirmed with ultrasound with image documentation. An appropriate skin site was determined. Skin site was marked. Region was prepped using maximum barrier technique including cap and mask, sterile gown, sterile gloves, large sterile sheet, and Chlorhexidine as cutaneous antisepsis. The region was infiltrated locally with 1% lidocaine. Under real-time ultrasound guidance, the right IJ vein was accessed with a 21 gauge micropuncture needle; the needle tip within the vein was confirmed with ultrasound image documentation. Needle was exchanged over a 018 guidewire for transitional dilator which allowed passage of the [facility] wire into the IVC. Over this, the transitional dilator was exchanged for a 5 [place] MPA catheter. Note was made of a recurrent moderately large right pleural effusion with leftward mediastinal shift. A small incision was made on the right anterior chest wall and  a subcutaneous pocket fashioned. The power-injectable port was positioned and its catheter tunneled to the right IJ dermatotomy site. The MPA catheter was exchanged over an Amplatz wire for a peel-away sheath, through which the port catheter, which had been trimmed to the appropriate length, was advanced and positioned under fluoroscopy with its tip at the cavoatrial junction. Spot chest radiograph confirms good catheter position and no pneumothorax. The pocket was closed with deep interrupted and subcuticular continuous 3-0 Monocryl sutures. The port was flushed per protocol. The incisions were covered with Dermabond then covered with a sterile dressing.  COMPLICATIONS: COMPLICATIONS None immediate  IMPRESSION: 1. Technically  successful right IJ power-injectable port catheter placement. Ready for routine use. 2. Recurrent moderately large right pleural effusion.   Electronically Signed   By: Lucrezia Europe M.D.   On: 11/23/2014 15:24   Ir US Guide Vasc Access Right  11/23/2014   CLINICAL DATA:  Recurrent metastatic breast carcinoma, needs venous access for chemotherapy  EXAM: TUNNELED PORT CATHETER PLACEMENT WITH ULTRASOUND AND FLUOROSCOPIC GUIDANCE  FLUOROSCOPY TIME:  1.1 minutes, 17.8 mGy  ANESTHESIA/SEDATION: Intravenous Fentanyl and Versed were administered as conscious sedation during continuous cardiorespiratory monitoring by the radiology RN, with a total moderate sedation time of less than 30 minutes.  TECHNIQUE: The procedure, risksbenefits, and alternatives were explained to the patient. Questions regarding the procedure were encouraged and answered. The patient understands and consents to the procedure. As antibiotic prophylaxis, cefazolin was ordered pre-procedure and administered intravenously within one hour of incision., Patency of the right IJ vein was confirmed with ultrasound with image documentation. An appropriate skin site was determined. Skin site was marked. Region was prepped using maximum barrier technique including cap and mask, sterile gown, sterile gloves, large sterile sheet, and Chlorhexidine as cutaneous antisepsis. The region was infiltrated locally with 1% lidocaine. Under real-time ultrasound guidance, the right IJ vein was accessed with a 21 gauge micropuncture needle; the needle tip within the vein was confirmed with ultrasound image documentation. Needle was exchanged over a 018 guidewire for transitional dilator which allowed passage of the [facility] wire into the IVC. Over this, the transitional dilator was exchanged for a 5 [place] MPA catheter. Note was made of a recurrent moderately large right pleural effusion with leftward mediastinal shift. A small incision was made on the right anterior chest wall  and a subcutaneous pocket fashioned. The power-injectable port was positioned and its catheter tunneled to the right IJ dermatotomy site. The MPA catheter was exchanged over an Amplatz wire for a peel-away sheath, through which the port catheter, which had been trimmed to the appropriate length, was advanced and positioned under fluoroscopy with its tip at the cavoatrial junction. Spot chest radiograph confirms good catheter position and no pneumothorax. The pocket was closed with deep interrupted and subcuticular continuous 3-0 Monocryl sutures. The port was flushed per protocol. The incisions were covered with Dermabond then covered with a sterile dressing.  COMPLICATIONS: COMPLICATIONS None immediate  IMPRESSION: 1. Technically successful right IJ power-injectable port catheter placement. Ready for routine use. 2. Recurrent moderately large right pleural effusion.   Electronically Signed   By: Lucrezia Europe M.D.   On: 11/23/2014 15:24   Dg Chest Port 1 View  12/03/2014   CLINICAL DATA:  Shortness of breath. Recent right-sided thoracentesis  EXAM: PORTABLE CHEST - 1 VIEW  COMPARISON:  December 02, 2014  FINDINGS: There is persistent consolidation in the right mid and lower lung zones with residual right effusion, stable. There are scattered pulmonary  nodular opacities, consistent with metastatic disease. Heart is borderline enlarged with pulmonary vascularity within normal limits. Port-A-Cath tip is in the right atrium. No pneumothorax. There are surgical clips in each axillary region.  IMPRESSION: Stable right effusion with right mid lower lung zone airspace consolidation. No edema or consolidation is seen on the left. Multiple pulmonary nodular opacities are seen bilaterally consistent with metastatic foci. Clips are noted in each axillary region. Heart prominent but stable.   Electronically Signed   By: Lowella Grip III M.D.   On: 12/03/2014 10:37   US Thoracentesis Asp Pleural Space W/img Guide  12/02/2014    INDICATION: Symptomatic recurrent right sided pleural effusion  EXAM: US THORACENTESIS ASP PLEURAL SPACE W/IMG GUIDE  COMPARISON:  CXR 12/01/14.  MEDICATIONS: None  COMPLICATIONS: None immediate  TECHNIQUE: Informed written consent was obtained from the patient after a discussion of the risks, benefits and alternatives to treatment. A timeout was performed prior to the initiation of the procedure.  Initial ultrasound scanning demonstrates a right pleural effusion. The lower chest was prepped and draped in the usual sterile fashion. 1% lidocaine was used for local anesthesia.  An ultrasound image was saved for documentation purposes. A 6 Fr Safe-T-Centesis catheter was introduced. The thoracentesis was performed. The catheter was removed and a dressing was applied. The patient tolerated the procedure well without immediate post procedural complication. The patient was escorted to have an upright chest radiograph.  FINDINGS: A total of approximately 1.2 liters of amber colored fluid was removed. Requested samples were sent to the laboratory.  IMPRESSION: Successful ultrasound-guided right sided thoracentesis yielding 1.2 liters of pleural fluid.  Read By:  Tsosie Billing PA-C   Electronically Signed   By: Corrie Mckusick D.O.   On: 12/02/2014 13:39   US Thoracentesis Asp Pleural Space W/img Guide  11/17/2014   INDICATION: Breast cancer, dyspnea, right pleural effusion; request is made for diagnostic and therapeutic right thoracentesis.  EXAM: ULTRASOUND GUIDED DIAGNOSTIC AND THERAPEUTIC RIGHT THORACENTESIS  COMPARISON:  None.  MEDICATIONS: None  COMPLICATIONS: None immediate  TECHNIQUE: Informed written consent was obtained from the patient after a discussion of the risks, benefits and alternatives to treatment. A timeout was performed prior to the initiation of the procedure.  Initial ultrasound scanning demonstrates a moderate to large right pleural effusion. The lower chest was prepped and draped in the usual  sterile fashion. 1% lidocaine was used for local anesthesia.  An ultrasound image was saved for documentation purposes. A 6 Fr Safe-T-Centesis catheter was introduced. The thoracentesis was performed. The catheter was removed and a dressing was applied. The patient tolerated the procedure well without immediate post procedural complication. The patient was escorted to have an upright chest radiograph.  FINDINGS: A total of approximately 1.3 liters of turbid, amber fluid was removed. Requested samples were sent to the laboratory.  IMPRESSION: Successful ultrasound-guided diagnostic and therapeutic right sided thoracentesis yielding 1.3 liters of pleural fluid.  Read by: Rowe Robert, PA-C   Electronically Signed   By: Sandi Mariscal M.D.   On: 11/17/2014 11:13    Microbiology: Recent Results (from the past 240 hour(s))  Body fluid culture     Status: None (Preliminary result)   Collection Time: 12/02/14 11:56 AM  Result Value Ref Range Status   Specimen Description PLEURAL RIGHT  Final   Special Requests NONE  Final   Gram Stain   Final    FEW WBC PRESENT, PREDOMINANTLY MONONUCLEAR NO ORGANISMS SEEN Performed at [facility]  Culture   Final    NO GROWTH 1 DAY Performed at [facility]    Report Status PENDING  Incomplete     Labs: Basic Metabolic Panel:  Recent Labs Lab 11/28/14 1332 12/01/14 1817 12/02/14 0532 12/03/14 0733  NA 143 141 143 140  K 3.7 3.0* 3.6 3.7  CL  --  100 101 101  CO2 21* 29 23 29   GLUCOSE 113 128* 127* 118*  BUN 11.9 12 12 14   CREATININE 1.0 1.02 1.06 1.10*  CALCIUM 9.8 8.9 9.0 8.5*   Liver Function Tests:  Recent Labs Lab 11/28/14 1332 12/01/14 1817 12/02/14 0532  AST 23 29 28   ALT 13 17 19   ALKPHOS 83 74 76  BILITOT 0.45 0.7 0.5  PROT 7.0 6.7 7.2  ALBUMIN 3.7 3.7 3.8   No results for input(s): LIPASE, AMYLASE in the last 168 hours. No results for input(s): AMMONIA in the last 168 hours. CBC:  Recent Labs Lab  11/28/14 1329 12/02/14 0532 12/03/14 0733  WBC 5.7 5.4 5.1  NEUTROABS 3.8  --   --   HGB 12.8 13.2 11.7*  HCT 38.3 39.8 36.4  MCV 91.4 91.9 93.8  PLT 246 243 221   Cardiac Enzymes: No results for input(s): CKTOTAL, CKMB, CKMBINDEX, TROPONINI in the last 168 hours. BNP: BNP (last 3 results)  Recent Labs  10/30/14 1300  BNP 55.3    ProBNP (last 3 results) No results for input(s): PROBNP in the last 8760 hours.  CBG:  Recent Labs Lab 12/02/14 1219 12/02/14 1734 12/02/14 2149 12/03/14 0820 12/03/14 1235  GLUCAP 98 128* 123* 111* 113*       Signed:  Shamaya Kauer  [facility] 12/03/2014, 2:42 PM

## 2014-12-03 NOTE — Progress Notes (Signed)
Patient discharge teaching given, including activity, diet, follow-up appoints, and medications. Patient verbalized understanding of all discharge instructions. IV access was d/c'd. Vitals are stable. Skin is intact except as charted in most recent assessments. Pt to be escorted out by NT, to be driven home by family. 

## 2014-12-04 LAB — HEMOGLOBIN A1C
Hgb A1c MFr Bld: 7.4 % — ABNORMAL HIGH (ref 4.8–5.6)
Mean Plasma Glucose: 166 mg/dL

## 2014-12-05 ENCOUNTER — Telehealth: Payer: Self-pay | Admitting: Family Medicine

## 2014-12-05 LAB — BODY FLUID CULTURE: Culture: NO GROWTH

## 2014-12-05 NOTE — Telephone Encounter (Signed)
Please send refill with 3 months with 3 refills at current dose. Please help her to schedule appointment to assist in changing her diabetes medications - if we reduce this one we may need to add another.

## 2014-12-05 NOTE — Assessment & Plan Note (Addendum)
Stage III, triple negative, invasive ductal carcinoma of the left breast. She has completed adjuvant radiation with concurrent Xeloda. She is doing well today. She has no sign of recurrence. 3.9 cm size and 9/13 lymph nodes were positive with extracapsular extension. ER/PR negative HER-2 negative Ki-67 21% March 2016: CT chest revealed bilateral lung nodules biopsy-proven to be triple negative metastatic carcinoma , pleural effusion status post thoracentesis malignant cells on cytology  Current Treatment: Cycle 1 day 1 Halaven (days 1,8 q 3 weeks)  starting 12/06/14 Shortness of breath: Return to clinic in 1 week for toxicity check.

## 2014-12-05 NOTE — Telephone Encounter (Signed)
Patient would like a re-fill on metFORMIN (GLUCOPHAGE) 500 MG tablet sent to [id number] - [place], [place] - [address] AT [address].  Patient would also like to know if she can reduce the dosage due to her stomach getting upset?

## 2014-12-05 NOTE — Telephone Encounter (Signed)
Patient informed and states she still has diarrhea and is concerned.  Appt scheduled for Thursday afternoon as she has chemo on Wednesday.

## 2014-12-06 ENCOUNTER — Ambulatory Visit: Payer: BLUE CROSS/BLUE SHIELD

## 2014-12-06 ENCOUNTER — Ambulatory Visit (HOSPITAL_BASED_OUTPATIENT_CLINIC_OR_DEPARTMENT_OTHER): Payer: BLUE CROSS/BLUE SHIELD | Admitting: Hematology and Oncology

## 2014-12-06 ENCOUNTER — Ambulatory Visit (HOSPITAL_BASED_OUTPATIENT_CLINIC_OR_DEPARTMENT_OTHER): Payer: BLUE CROSS/BLUE SHIELD

## 2014-12-06 ENCOUNTER — Other Ambulatory Visit: Payer: BLUE CROSS/BLUE SHIELD

## 2014-12-06 ENCOUNTER — Telehealth: Payer: Self-pay | Admitting: Hematology and Oncology

## 2014-12-06 VITALS — BP 120/50 | HR 97 | Temp 98.0°F | Resp 19 | Ht 64.0 in | Wt 211.3 lb

## 2014-12-06 DIAGNOSIS — C7801 Secondary malignant neoplasm of right lung: Secondary | ICD-10-CM | POA: Diagnosis not present

## 2014-12-06 DIAGNOSIS — Z95828 Presence of other vascular implants and grafts: Secondary | ICD-10-CM

## 2014-12-06 DIAGNOSIS — C50412 Malignant neoplasm of upper-outer quadrant of left female breast: Secondary | ICD-10-CM | POA: Diagnosis not present

## 2014-12-06 DIAGNOSIS — Z5111 Encounter for antineoplastic chemotherapy: Secondary | ICD-10-CM | POA: Diagnosis not present

## 2014-12-06 DIAGNOSIS — C7802 Secondary malignant neoplasm of left lung: Secondary | ICD-10-CM

## 2014-12-06 DIAGNOSIS — Z171 Estrogen receptor negative status [ER-]: Secondary | ICD-10-CM | POA: Diagnosis not present

## 2014-12-06 DIAGNOSIS — C50912 Malignant neoplasm of unspecified site of left female breast: Principal | ICD-10-CM

## 2014-12-06 DIAGNOSIS — J910 Malignant pleural effusion: Secondary | ICD-10-CM

## 2014-12-06 DIAGNOSIS — C50911 Malignant neoplasm of unspecified site of right female breast: Secondary | ICD-10-CM

## 2014-12-06 MED ORDER — SODIUM CHLORIDE 0.9 % IJ SOLN
10.0000 mL | INTRAMUSCULAR | Status: DC | PRN
  Filled 2014-12-06: qty 10

## 2014-12-06 MED ORDER — SODIUM CHLORIDE 0.9 % IV SOLN
Freq: Once | INTRAVENOUS | Status: AC
  Administered 2014-12-06: 10:00:00 via INTRAVENOUS

## 2014-12-06 MED ORDER — ERIBULIN MESYLATE CHEMO INJECTION 1 MG/2ML
1.4200 mg/m2 | Freq: Once | INTRAVENOUS | Status: AC
  Administered 2014-12-06: 3 mg via INTRAVENOUS
  Filled 2014-12-06: qty 6

## 2014-12-06 MED ORDER — SODIUM CHLORIDE 0.9 % IV SOLN
Freq: Once | INTRAVENOUS | Status: AC
  Administered 2014-12-06: 11:00:00 via INTRAVENOUS
  Filled 2014-12-06: qty 4

## 2014-12-06 MED ORDER — HEPARIN SOD (PORK) LOCK FLUSH 100 UNIT/ML IV SOLN
500.0000 [IU] | Freq: Once | INTRAVENOUS | Status: AC | PRN
  Administered 2014-12-06: 500 [IU]
  Filled 2014-12-06: qty 5

## 2014-12-06 MED ORDER — SODIUM CHLORIDE 0.9 % IJ SOLN
10.0000 mL | INTRAMUSCULAR | Status: DC | PRN
  Administered 2014-12-06: 10 mL
  Filled 2014-12-06: qty 10

## 2014-12-06 NOTE — Progress Notes (Signed)
Per Dr. Lindi Adie, previous labs from hospital encounter are adequate for treatment today. OK to proceed without redrawing labs today.

## 2014-12-06 NOTE — Progress Notes (Signed)
Patient Care Team: Lucretia Kern, DO as PCP - General (Family Medicine)  DIAGNOSIS: Breast cancer of upper-outer quadrant of left female breast   Staging form: Breast, AJCC 7th Edition     Clinical: Stage IIIB (T4, N1, cM0) - Signed by Thea Silversmith, MD on 10/29/2012       Prognostic indicators: ER-, PR- HER2 -      Pathologic: No stage assigned - Unsigned       Prognostic indicators: ER-, PR- HER2 -    SUMMARY OF ONCOLOGIC HISTORY:   Breast cancer of upper-outer quadrant of left female breast   10/06/1986 Initial Diagnosis Right breast mastectomy, breast cancer diagnosed during pregnancy, no adjuvant treatment given   09/30/2012 Initial Biopsy Left breast biopsy done for left nipple retraction: Invasive ductal carcinoma grade 3, ER/PR negative, HER-2 negative, Ki-67 21% with grade 3 DCIS   10/12/2012 Breast MRI Left breast diffuse enhancement measuring 13 x 12.5 x 9.5 cm with diffuse skin thickening and dermal enhancement, multiple enlarged level I left axillary lymph nodes largest 2.4 cm   11/02/2012 - 06/06/2013 Neo-Adjuvant Chemotherapy Neoadjuvant FEC 4 followed by Taxol carbo 8 discontinued for neuropathy, Gemzar Carbo 2   09/15/2013 - 11/01/2013 Radiation Therapy Xeloda with adjuvant radiation   10/30/2014 Imaging Right hilar node 1.6 cm, subcarinal 1.2 cm, 1 cm additional right hilar, right lower lobe 3.2 cm, RUL 1 cm, and LDL of 1.3 cm, moderate pleural effusion, right liver 0.8 cm, 15 cm fluid collection left chest wall   10/31/2014 Initial Biopsy Lung biopsy right lower lobe: Carcinoma with Extensive necrosis, microscopic foci of nonnecrotic non-small cell, poorly differentiated carcinoma, metastatic disease likely ER 0%, PR 0%, HER-2 negative   12/01/2014 Procedure Second thoracentesis for malignant pleural effusion   12/06/2014 -  Chemotherapy Halaven day 1 day 8 every 3 weeks    CHIEF COMPLIANT: Cycle 1 day 1 Halaven  INTERVAL HISTORY: Christine [name] is a 59 year old lady with  above-mentioned history of metastatic breast cancer with malignant pleural effusion and lung nodules who is here to start first cycle of chemotherapy with Halaven. She was hospitalized over the weekend with pleural effusion and underwent a thoracentesis with a remote 1.7 L. She is also on nasal cannula oxygen. Her shortness of breath is improved a little bit. She is anxious to get started with her chemotherapy.  REVIEW OF SYSTEMS:   Constitutional: Denies fevers, chills or abnormal weight loss Eyes: Denies blurriness of vision Ears, nose, mouth, throat, and face: Denies mucositis or sore throat Respiratory: Shortness of breath and cough oxygen by nasal cannula Cardiovascular: Denies palpitation, chest discomfort or lower extremity swelling Gastrointestinal:  Denies nausea, heartburn or change in bowel habits Skin: Denies abnormal skin rashes Lymphatics: Denies new lymphadenopathy or easy bruising Neurological:Denies numbness, tingling or new weaknesses Behavioral/Psych: Mood is stable, no new changes  Breast: Seroma left breast All other systems were reviewed with the patient and are negative.  I have reviewed the past medical history, past surgical history, social history and family history with the patient and they are unchanged from previous note.  ALLERGIES:  is allergic to sulfa antibiotics and sulfonamide derivatives.  MEDICATIONS:  Current Outpatient Prescriptions  Medication Sig Dispense Refill  . cetirizine (ZYRTEC) 10 MG tablet Take 10 mg by mouth daily.    Christiane Ha Oil (CHIA SEED OIL EXTRACT PO) Take 1 each by mouth 4 (four) times a week.    . cholecalciferol (VITAMIN D) 1000 UNITS tablet Take 1,000 Units by mouth  every morning.     . diltiazem (CARDIZEM CD) 120 MG 24 hr capsule Take 1 capsule (120 mg total) by mouth every morning. 30 capsule 0  . esomeprazole (NEXIUM) 20 MG capsule Take 20 mg by mouth daily at 12 noon.    . Flaxseed, Linseed, (FLAX SEEDS PO) Take 1 each by mouth  4 (four) times a week.    . fluticasone (FLONASE) 50 MCG/ACT nasal spray Place 2 sprays into both nostrils daily as needed for allergies or rhinitis.    Marland Kitchen latanoprost (XALATAN) 0.005 % ophthalmic solution Place 1 drop into both eyes at bedtime.     Marland Kitchen levothyroxine (SYNTHROID, LEVOTHROID) 50 MCG tablet Take 1 tablet (50 mcg total) by mouth daily. 90 tablet 3  . lidocaine-prilocaine (EMLA) cream Apply to affected area once 30 g 3  . LORazepam (ATIVAN) 0.5 MG tablet Take 1 tablet (0.5 mg total) by mouth every 6 (six) hours as needed (Nausea or vomiting). 30 tablet 0  . losartan-hydrochlorothiazide (HYZAAR) 50-12.5 MG per tablet Take 1 tablet by mouth daily.  0  . metFORMIN (GLUCOPHAGE) 500 MG tablet Take 2 tablets (1,000 mg total) by mouth 2 (two) times daily with a meal. 120 tablet 1  . naproxen sodium (ANAPROX) 220 MG tablet Take 440 mg by mouth daily as needed (pain).    . NON FORMULARY Apply 1 application topically daily as needed (Applies to feet for athlete's foot.). Fungifoam    . ondansetron (ZOFRAN) 8 MG tablet Take 1 tablet (8 mg total) by mouth 2 (two) times daily. Start the day after chemo for 2 days. Then take as needed for nausea or vomiting. 30 tablet 1  . oxyCODONE (OXY IR/ROXICODONE) 5 MG immediate release tablet Take 1 tablet (5 mg total) by mouth every 4 (four) hours as needed for moderate pain. 30 tablet 0  . prochlorperazine (COMPAZINE) 10 MG tablet Take 1 tablet (10 mg total) by mouth every 6 (six) hours as needed (Nausea or vomiting). 30 tablet 1  . Tetrahydrozoline HCl (VISINE OP) Apply 1-2 drops to eye daily as needed (itchy eyes.).     No current facility-administered medications for this visit.   Facility-Administered Medications Ordered in Other Visits  Medication Dose Route Frequency Provider Last Rate Last Dose  . sodium chloride 0.9 % injection 10 mL  10 mL Intravenous PRN Nicholas Lose, MD        PHYSICAL EXAMINATION: ECOG PERFORMANCE STATUS: 1 - Symptomatic but  completely ambulatory  Filed Vitals:   12/06/14 0944  BP: 120/50  Pulse: 97  Temp: 98 F (36.7 C)  Resp: 19   Filed Weights   12/06/14 0944  Weight: 211 lb 4.8 oz (95.845 kg)    GENERAL:alert, no distress and comfortable SKIN: skin color, texture, turgor are normal, no rashes or significant lesions EYES: normal, Conjunctiva are pink and non-injected, sclera clear OROPHARYNX:no exudate, no erythema and lips, buccal mucosa, and tongue normal  NECK: supple, thyroid normal size, non-tender, without nodularity LYMPH:  no palpable lymphadenopathy in the cervical, axillary or inguinal LUNGS: Diminished breath sounds right lung base and dullness to percussion HEART: regular rate & rhythm and no murmurs and no lower extremity edema ABDOMEN:abdomen soft, non-tender and normal bowel sounds Musculoskeletal:no cyanosis of digits and no clubbing  NEURO: alert & oriented x 3 with fluent speech, no focal motor/sensory deficits  LABORATORY DATA:  I have reviewed the data as listed   Chemistry      Component Value Date/Time   NA 140  12/03/2014 0733   NA 143 11/28/2014 1332   K 3.7 12/03/2014 0733   K 3.7 11/28/2014 1332   CL 101 12/03/2014 0733   CL 101 01/25/2013 0952   CO2 29 12/03/2014 0733   CO2 21* 11/28/2014 1332   BUN 14 12/03/2014 0733   BUN 11.9 11/28/2014 1332   CREATININE 1.10* 12/03/2014 0733   CREATININE 1.0 11/28/2014 1332      Component Value Date/Time   CALCIUM 8.5* 12/03/2014 0733   CALCIUM 9.8 11/28/2014 1332   ALKPHOS 76 12/02/2014 0532   ALKPHOS 83 11/28/2014 1332   AST 28 12/02/2014 0532   AST 23 11/28/2014 1332   ALT 19 12/02/2014 0532   ALT 13 11/28/2014 1332   BILITOT 0.5 12/02/2014 0532   BILITOT 0.45 11/28/2014 1332       Lab Results  Component Value Date   WBC 5.1 12/03/2014   HGB 11.7* 12/03/2014   HCT 36.4 12/03/2014   MCV 93.8 12/03/2014   PLT 221 12/03/2014   NEUTROABS 3.8 11/28/2014    ASSESSMENT & PLAN:  Breast cancer of  upper-outer quadrant of left female breast Stage III, triple negative, invasive ductal carcinoma of the left breast. She has completed adjuvant radiation with concurrent Xeloda. She is doing well today. She has no sign of recurrence. 3.9 cm size and 9/13 lymph nodes were positive with extracapsular extension. ER/PR negative HER-2 negative Ki-67 21% March 2016: CT chest revealed bilateral lung nodules biopsy-proven to be triple negative metastatic carcinoma , pleural effusion status post thoracentesis malignant cells on cytology, repeat thoracentesis 12/01/2014  Current Treatment: Cycle 1 day 1 Halaven (days 1,8 q 3 weeks)  starting 12/06/14 Shortness of breath: Related to malignant pleural effusion status post to thoracentesis. Discussed with her about pleurodesis but at this point she would like to see the chemotherapy would hold off on recurrent pleural effusion. She is currently on oxygen by nasal cannula.  Radiology review: PET CT scan 11/27/2014 showed widespread metastatic disease to the thorax with multiple pulmonary nodules and masses and malignant right pleural effusion and right hilar and mediastinal malignant lymph nodes no definite bone metastases. Cholelithiasis and colonic diverticulosis  Return to clinic in 1 week for toxicity check.  No orders of the defined types were placed in this encounter.   The patient has a good understanding of the overall plan. she agrees with it. she will call with any problems that may develop before the next visit here.   Rulon Eisenmenger, MD

## 2014-12-06 NOTE — Patient Instructions (Signed)
[facility] Discharge Instructions for Patients Receiving Chemotherapy  Today you received the following chemotherapy agent: Halaven   To help prevent nausea and vomiting after your treatment, we encourage you to take your nausea medication as prescribed   If you develop nausea and vomiting that is not controlled by your nausea medication, call the clinic.   BELOW ARE SYMPTOMS THAT SHOULD BE REPORTED IMMEDIATELY:  *FEVER GREATER THAN 100.5 F  *CHILLS WITH OR WITHOUT FEVER  NAUSEA AND VOMITING THAT IS NOT CONTROLLED WITH YOUR NAUSEA MEDICATION  *UNUSUAL SHORTNESS OF BREATH  *UNUSUAL BRUISING OR BLEEDING  TENDERNESS IN MOUTH AND THROAT WITH OR WITHOUT PRESENCE OF ULCERS  *URINARY PROBLEMS  *BOWEL PROBLEMS  UNUSUAL RASH Items with * indicate a potential emergency and should be followed up as soon as possible.  Feel free to call the clinic you have any questions or concerns. The clinic phone number is [phone].  Please show the [facility] at check-in to the Emergency Department and triage nurse.

## 2014-12-06 NOTE — Progress Notes (Signed)
Per Jonelle Sports, RN pt doesn't need flush appt or labs drawn.

## 2014-12-06 NOTE — Telephone Encounter (Signed)
Flush added to next appointment per pof and patient will get an avs in chemo

## 2014-12-07 ENCOUNTER — Telehealth: Payer: Self-pay | Admitting: *Deleted

## 2014-12-07 ENCOUNTER — Ambulatory Visit: Payer: BLUE CROSS/BLUE SHIELD

## 2014-12-07 ENCOUNTER — Encounter: Payer: Self-pay | Admitting: Family Medicine

## 2014-12-07 ENCOUNTER — Ambulatory Visit (INDEPENDENT_AMBULATORY_CARE_PROVIDER_SITE_OTHER): Payer: BLUE CROSS/BLUE SHIELD | Admitting: Family Medicine

## 2014-12-07 VITALS — BP 112/80 | HR 105 | Temp 98.1°F | Ht 64.0 in | Wt 212.3 lb

## 2014-12-07 DIAGNOSIS — I10 Essential (primary) hypertension: Secondary | ICD-10-CM

## 2014-12-07 DIAGNOSIS — C50412 Malignant neoplasm of upper-outer quadrant of left female breast: Secondary | ICD-10-CM | POA: Diagnosis not present

## 2014-12-07 DIAGNOSIS — C7800 Secondary malignant neoplasm of unspecified lung: Secondary | ICD-10-CM

## 2014-12-07 DIAGNOSIS — E119 Type 2 diabetes mellitus without complications: Secondary | ICD-10-CM | POA: Diagnosis not present

## 2014-12-07 MED ORDER — METFORMIN HCL 500 MG PO TABS: ORAL_TABLET | ORAL | Status: DC

## 2014-12-07 NOTE — Telephone Encounter (Signed)
-----   Message from Alla Feeling, RN sent at 12/06/2014 11:23 AM EDT ----- Regarding: chemo f/u call Contact: [phone] 1st halaven. Change in therapy. No issues. Christine [name]

## 2014-12-07 NOTE — Telephone Encounter (Signed)
No answer

## 2014-12-07 NOTE — Progress Notes (Signed)
Pre visit review using our clinic review tool, if applicable. No additional management support is needed unless otherwise documented below in the visit note. 

## 2014-12-07 NOTE — Patient Instructions (Signed)
Decrease metformin to 1000mg  in the morning and 500mg  at night  Continue to hold on the blood pressure medication unless BP increases  Follow in 3 months

## 2014-12-07 NOTE — Progress Notes (Signed)
HPI:  Christine [name] is a 59 yo female, unfortunately currently battling metastatic breast Ca with recent hospitalization and thoracocentesis followed closely by oncology and recently started on Halaven, here for an acute visit for:  DM - adjustment in treatment: -new dx 10/2014 with hgba1c 8/7, recheck 4/29 was 7.4 and recent Glu checks only mildly elevated -started metformin and advised to see diabetes educator 11/2014 -reports: taking metformin 1000mg  bid - is causing some diarrhea on the increased dose  -denies: denies excessive, diarrhea, vomiting, nausea  HTN/Hx of arrhythmia: -reports losartan-hctz stopped during recent hospitalization due to low BP -is taking cardizem and request refill of this - reports told needs this in the hospital -there is no discharge summary yet and I can not figure out from her notes why changes made or why she is on cardizem - hopefully discharge summary will be completed by the next time I see her -reports she is working on weight loss with diet and exercise -denies: CP, SOB, DOE  ROS: See pertinent positives and negatives per HPI.  Past Medical History  Diagnosis Date  . ALLERGIC RHINITIS   . GERD (gastroesophageal reflux disease)   . Hypertension   . Hypothyroidism   . Hx of colonic polyps   . Wears glasses   . Breast cancer 43, age 74    right  . Breast cancer 09/30/12    left, ER/PR -, Her 2 -  . Radiation 09/15/13-11/01/13    Left chest wall/PAB/scar/supraclavicular fossa  . SYNCOPE 05/15/2010    Qualifier: Diagnosis of  By: Arnoldo Morale MD, [place], WITH RADICULOPATHY 10/01/2007    Qualifier: Diagnosis of  By: Arnoldo Morale MD, [place] 05/31/2007    Qualifier: Diagnosis of  By: Arnoldo Morale MD, [place], HX OF 05/31/2007    Qualifier: Diagnosis of  By: Arnoldo Morale MD, Balinda Quails   . Lymph edema L arm from breast ca tx 12/22/2013  . Diabetes mellitus without complication     Past Surgical History  Procedure  Laterality Date  . Tonsillectomy    . Caesarean section      x 1  . Mastectomy  04/1987    right side  . Abdominal hysterectomy  06/1988    fibroids  . Portacath placement Right 10/28/2012    Procedure: PORT PLACEMENT;  Surgeon: Joyice Faster. Cornett, MD;  Location: [facility];  Service: General;  Laterality: Right;  Right Subclavian Vein  . Mastectomy modified radical Left 07/19/2013    Procedure: MASTECTOMY MODIFIED RADICAL;  Surgeon: Marcello Moores A. Cornett, MD;  Location: [facility];  Service: General;  Laterality: Left;  . Port-a-cath removal Right 07/19/2013    Procedure: REMOVAL PORT-A-CATH;  Surgeon: Joyice Faster. Cornett, MD;  Location: [facility];  Service: General;  Laterality: Right;  . Breast surgery Left 07/19/13    MRM    Family History  Problem Relation Age of Onset  . Colon cancer Mother 7    family hx of colon ca 1st degree relative <60  . Coronary artery disease Father     family hx of CAD female 1st degree relative <50  . Stomach cancer Neg Hx   . Rectal cancer Neg Hx   . Esophageal cancer Neg Hx   . Breast cancer Maternal Grandmother 80  . Diabetes Paternal Grandmother   . Breast cancer Cousin 64    maternal cousin  . Coronary artery disease Maternal  Grandfather   . Breast cancer Sister     paternal half sister; died in her 57s    History   Social History  . Marital Status: Married    Spouse Name: Donnie  . Number of Children: 1  . Years of Education: N/A   Occupational History  . Unemployed    Social History Main Topics  . Smoking status: Never Smoker   . Smokeless tobacco: Never Used  . Alcohol Use: No  . Drug Use: No  . Sexual Activity: Not Currently     Comment: menarche 34, 1st pregnancy age 24-miscarr, age 13 1st live birth, menop 46   Other Topics Concern  . None   Social History Narrative   Married. Lives w/ husband.  Has not worked since being diagnosed with cancer 2 years ago.  Previously worked as a  Solicitor.  Independent with ADLs.      Spiritual Beliefs:Christian      Lifestyle: started the [employer] exercise program for cancer survivors (12/2013); working on diet              Current outpatient prescriptions:  .  cetirizine (ZYRTEC) 10 MG tablet, Take 10 mg by mouth daily., Disp: , Rfl:  .  Chia Oil (CHIA SEED OIL EXTRACT PO), Take 1 each by mouth 4 (four) times a week., Disp: , Rfl:  .  cholecalciferol (VITAMIN D) 1000 UNITS tablet, Take 1,000 Units by mouth every morning. , Disp: , Rfl:  .  diltiazem (CARDIZEM CD) 120 MG 24 hr capsule, Take 1 capsule (120 mg total) by mouth every morning., Disp: 30 capsule, Rfl: 0 .  esomeprazole (NEXIUM) 20 MG capsule, Take 20 mg by mouth daily at 12 noon., Disp: , Rfl:  .  Flaxseed, Linseed, (FLAX SEEDS PO), Take 1 each by mouth 4 (four) times a week., Disp: , Rfl:  .  fluticasone (FLONASE) 50 MCG/ACT nasal spray, Place 2 sprays into both nostrils daily as needed for allergies or rhinitis., Disp: , Rfl:  .  latanoprost (XALATAN) 0.005 % ophthalmic solution, Place 1 drop into both eyes at bedtime. , Disp: , Rfl:  .  levothyroxine (SYNTHROID, LEVOTHROID) 50 MCG tablet, Take 1 tablet (50 mcg total) by mouth daily., Disp: 90 tablet, Rfl: 3 .  lidocaine-prilocaine (EMLA) cream, Apply to affected area once, Disp: 30 g, Rfl: 3 .  LORazepam (ATIVAN) 0.5 MG tablet, Take 1 tablet (0.5 mg total) by mouth every 6 (six) hours as needed (Nausea or vomiting)., Disp: 30 tablet, Rfl: 0 .  metFORMIN (GLUCOPHAGE) 500 MG tablet, Take 1000mg  (2 tabs) in the morning and 500mg  (1 tab) in the evening with meals, Disp: 135 tablet, Rfl: 3 .  naproxen sodium (ANAPROX) 220 MG tablet, Take 440 mg by mouth daily as needed (pain)., Disp: , Rfl:  .  NON FORMULARY, Apply 1 application topically daily as needed (Applies to feet for athlete's foot.). Fungifoam, Disp: , Rfl:  .  ondansetron (ZOFRAN) 8 MG tablet, Take 1 tablet (8 mg total) by mouth 2 (two) times daily.  Start the day after chemo for 2 days. Then take as needed for nausea or vomiting., Disp: 30 tablet, Rfl: 1 .  oxyCODONE (OXY IR/ROXICODONE) 5 MG immediate release tablet, Take 1 tablet (5 mg total) by mouth every 4 (four) hours as needed for moderate pain., Disp: 30 tablet, Rfl: 0 .  prochlorperazine (COMPAZINE) 10 MG tablet, Take 1 tablet (10 mg total) by mouth every 6 (six) hours as needed (Nausea  or vomiting)., Disp: 30 tablet, Rfl: 1 .  Tetrahydrozoline HCl (VISINE OP), Apply 1-2 drops to eye daily as needed (itchy eyes.)., Disp: , Rfl:   EXAM:  Filed Vitals:   12/07/14 1510  BP: 112/80  Pulse: 105  Temp: 98.1 F (36.7 C)    Body mass index is 36.42 kg/(m^2).  GENERAL: vitals reviewed and listed above, alert, oriented, appears well hydrated and in no acute distress  HEENT: atraumatic, conjunttiva clear, no obvious abnormalities on inspection of external nose and ears  NECK: no obvious masses on inspection  LUNGS: clear to auscultation bilaterally, no wheezes, rales or rhonchi, good air movement  CV: HRRR, no peripheral edema  MS: moves all extremities without noticeable abnormality  PSYCH: pleasant and cooperative, no obvious depression or anxiety  ASSESSMENT AND PLAN:  Discussed the following assessment and plan:  Type 2 diabetes mellitus without complication  Essential hypertension  Morbid obesity  Breast cancer of upper-outer quadrant of left female breast  Malignant neoplasm metastatic to lung, unspecified laterality  -discussed options and she decided to stay on metformin but decrease ro 1500mg  per day -follow up as needed and in 3 months - may need to restart losartan at some point given her diabetes -continued close monitoring, treatments with oncology -cont healthy lifestyle changes -Patient advised to return or notify a doctor immediately if symptoms worsen or persist or new concerns arise.  Patient Instructions  Decrease metformin to 1000mg  in the  morning and 500mg  at night  Continue to hold on the blood pressure medication unless BP increases  Follow in 3 months     Torrez Renfroe R.

## 2014-12-11 ENCOUNTER — Encounter: Payer: BLUE CROSS/BLUE SHIELD | Attending: Family Medicine | Admitting: *Deleted

## 2014-12-11 ENCOUNTER — Encounter: Payer: Self-pay | Admitting: *Deleted

## 2014-12-11 DIAGNOSIS — E119 Type 2 diabetes mellitus without complications: Secondary | ICD-10-CM | POA: Diagnosis present

## 2014-12-11 DIAGNOSIS — Z713 Dietary counseling and surveillance: Secondary | ICD-10-CM | POA: Diagnosis not present

## 2014-12-11 NOTE — Progress Notes (Signed)
Diabetes Self-Management Education  Visit Type:    Appt. Start Time: 0930 Appt. End Time: 3382  12/11/2014  Christine [name], identified by name and date of birth, is a 59 y.o. female with a diagnosis of Diabetes: Type 2.  Other people present during visit:  Patient   ASSESSMENT  Patient has had breast cancer 3 times now.  Was hospitalized 1 month ago and discovered cancer was back and she has diabetes  Initial Visit Information:  Are you currently following a meal plan?: No  Are you taking your medications as prescribed?: Yes (had to stop a couple times for medical procedures/contrast dye).  Also had to reduce dose due to GI distress Are you checking your feet?: Yes How many days per week are you checking your feet?: 7 How often do you need to have someone help you when you read instructions, pamphlets, or other written materials from your doctor or pharmacy?: 1 - Never What is the last grade level you completed in school?: high school graduate  Psychosocial:   Patient Belief/Attitude about Diabetes: Motivated to manage diabetes Self-care barriers: None Self-management support: Doctor's office, Internet communities Other persons present: Patient Patient Concerns: Nutrition/Meal planning, Healthy Lifestyle, Glycemic Control, Weight Control Preferred Learning Style: Auditory, Visual Learning Readiness: Ready  Complications:   Last HgB A1C per patient/outside source: 7.4 mg/dL How often do you check your blood sugar?: 0 times/day (not testing).  However, patient interested in BGM Have you had a dilated eye exam in the past 12 months?: Yes (goes every 6 months due to glaucoma) Have you had a dental exam in the past 12 months?: Yes  Diet Intake: (isn't eating much due to chemo)  Breakfast: oatmeal with cinnamon, walnuts, raisins; or couple boiled egged with applesauce  Dinner: fish or baked chicken with vegetables or salad, brown rice or quinoa.  sometimes berries Beverage(s):  4 oz pomegranate juice, water (mineral water with lemond), sometimes almond milk, unsweet tea or green tea with Stevia  Exercise:  Exercise: ADL's (on oxygen)  Individualized Plan for Diabetes Self-Management Training:   Learning Objective:  Patient will have a greater understanding of diabetes self-management.  Patient education plan per assessed needs and concerns is to attend individual sessions    Education Topics Reviewed with Patient Today:  Definition of diabetes, type 1 and 2, and the diagnosis of diabetes, Factors that contribute to the development of diabetes, Explored patient's options for treatment of their diabetes Role of diet in the treatment of diabetes and the relationship between the three main macronutrients and blood glucose level, Food label reading, portion sizes and measuring food., Carbohydrate counting, Meal timing in regards to the patients' current diabetes medication., Information on hints to eating out and maintain blood glucose control. Role of exercise on diabetes management, blood pressure control and cardiac health., Helped patient identify appropriate exercises in relation to his/her diabetes, diabetes complications and other health issue. Reviewed patients medication for diabetes, action, purpose, timing of dose and side effects. Purpose and frequency of SMBG., Taught/discussed recording of test results and interpretation of SMBG., Identified appropriate SMBG and/or A1C goals., Daily foot exams, Yearly dilated eye exam Taught treatment of hypoglycemia - the 15 rule., Covered sick day management with medication and food. Dental care, Reviewed with patient heart disease, higher risk of, and prevention Role of stress on diabetes, Identified and addressed patients feelings and concerns about diabetes, Brainstormed with patient on coping mechanisms for social situations, getting support from significant others, dealing with feelings about  diabetes      PATIENTS  GOALS/Plan (Developed by the patient):  Nutrition: Follow meal plan discussed Physical Activity:  (chair exercises as able) Medications: Other:.  Take medication with adequate food and ask provider about extended release if GI symptoms persist.  Keep in mind chemo will elevate glucose levels and may need additional pharmacological support (insulin) while on chemo Monitoring : Other: (test twice daily) Reducing Risk: examine blood glucose patterns Health Coping: discuss diabetes with church mom  Plan:   Patient Instructions  Goals:  Follow Diabetes Meal Plan as instructed  Eat 3 meals and 2 snacks, every 3-5 hrs  Limit carbohydrate intake to 30-45 grams carbohydrate/meal  Limit carbohydrate intake to 15 grams carbohydrate/snack  Add lean protein foods to meals/snacks  Monitor glucose levels as instructed by your doctor  Aim for 5-10 mins of physical activity daily  Bring food record and glucose log to your next nutrition visit     Expected Outcomes:  Demonstrated interest in learning. Expect positive outcomes  Education material provided: Living Well with Diabetes, Meal plan card and Support group flyer  If problems or questions, patient to contact team via:  Phone  Future DSME appointment: PRN

## 2014-12-11 NOTE — Patient Instructions (Signed)
Goals:  Follow Diabetes Meal Plan as instructed  Eat 3 meals and 2 snacks, every 3-5 hrs  Limit carbohydrate intake to 30-45 grams carbohydrate/meal  Limit carbohydrate intake to 15 grams carbohydrate/snack  Add lean protein foods to meals/snacks  Monitor glucose levels as instructed by your doctor  Aim for 5-10 mins of physical activity daily  Bring food record and glucose log to your next nutrition visit

## 2014-12-12 NOTE — Assessment & Plan Note (Signed)
09/30/2012 :Stage III, triple negative, invasive ductal carcinoma of the left breast.  Neoadjuvant chemotherapy following the surgery followed by adjuvant radiation with concurrent Xeloda. 3.9 cm size and 9/13 lymph nodes were positive with extracapsular extension. ER/PR negative HER-2 negative Ki-67 21% March 2016: CT chest revealed bilateral lung nodules biopsy-proven to be triple negative metastatic carcinoma , pleural effusion status post thoracentesis malignant cells on cytology, repeat thoracentesis 12/01/2014 PET CT scan 11/27/2014 showed widespread metastatic disease to the thorax with multiple pulmonary nodules and masses and malignant right pleural effusion and right hilar and mediastinal malignant lymph nodes.  Current Treatment: Cycle 1 day 1 Halaven (days 1,8 q 3 weeks) starting 12/06/14 Shortness of breath: Related to malignant pleural effusion status post to thoracentesis. Discussed with her about pleurodesis but at this point she would like to see the chemotherapy would hold off on recurrent pleural effusion. She is currently on oxygen by nasal cannula.  Halaven toxicities:   Return to clinic in  2 weeks for cycle 2.

## 2014-12-13 ENCOUNTER — Other Ambulatory Visit: Payer: Self-pay | Admitting: *Deleted

## 2014-12-13 ENCOUNTER — Ambulatory Visit (HOSPITAL_BASED_OUTPATIENT_CLINIC_OR_DEPARTMENT_OTHER): Payer: BLUE CROSS/BLUE SHIELD | Admitting: Hematology and Oncology

## 2014-12-13 ENCOUNTER — Other Ambulatory Visit: Payer: BLUE CROSS/BLUE SHIELD

## 2014-12-13 ENCOUNTER — Ambulatory Visit: Payer: BLUE CROSS/BLUE SHIELD

## 2014-12-13 ENCOUNTER — Telehealth: Payer: Self-pay | Admitting: Hematology and Oncology

## 2014-12-13 ENCOUNTER — Ambulatory Visit (HOSPITAL_BASED_OUTPATIENT_CLINIC_OR_DEPARTMENT_OTHER): Payer: BLUE CROSS/BLUE SHIELD

## 2014-12-13 ENCOUNTER — Other Ambulatory Visit (HOSPITAL_BASED_OUTPATIENT_CLINIC_OR_DEPARTMENT_OTHER): Payer: BLUE CROSS/BLUE SHIELD

## 2014-12-13 VITALS — BP 134/64 | HR 107 | Temp 98.2°F | Resp 19 | Ht 64.0 in | Wt 212.3 lb

## 2014-12-13 DIAGNOSIS — D701 Agranulocytosis secondary to cancer chemotherapy: Secondary | ICD-10-CM

## 2014-12-13 DIAGNOSIS — D6481 Anemia due to antineoplastic chemotherapy: Secondary | ICD-10-CM | POA: Diagnosis not present

## 2014-12-13 DIAGNOSIS — C7802 Secondary malignant neoplasm of left lung: Secondary | ICD-10-CM

## 2014-12-13 DIAGNOSIS — C7801 Secondary malignant neoplasm of right lung: Secondary | ICD-10-CM

## 2014-12-13 DIAGNOSIS — C50912 Malignant neoplasm of unspecified site of left female breast: Principal | ICD-10-CM

## 2014-12-13 DIAGNOSIS — C50412 Malignant neoplasm of upper-outer quadrant of left female breast: Secondary | ICD-10-CM

## 2014-12-13 DIAGNOSIS — Z95828 Presence of other vascular implants and grafts: Secondary | ICD-10-CM

## 2014-12-13 DIAGNOSIS — Z5111 Encounter for antineoplastic chemotherapy: Secondary | ICD-10-CM | POA: Diagnosis not present

## 2014-12-13 DIAGNOSIS — C50911 Malignant neoplasm of unspecified site of right female breast: Secondary | ICD-10-CM

## 2014-12-13 LAB — CBC WITH DIFFERENTIAL/PLATELET
BASO%: 0.3 % (ref 0.0–2.0)
BASOS ABS: 0 10*3/uL (ref 0.0–0.1)
EOS%: 0.9 % (ref 0.0–7.0)
Eosinophils Absolute: 0 10*3/uL (ref 0.0–0.5)
HCT: 33 % — ABNORMAL LOW (ref 34.8–46.6)
HGB: 11 g/dL — ABNORMAL LOW (ref 11.6–15.9)
LYMPH%: 27.6 % (ref 14.0–49.7)
MCH: 29.8 pg (ref 25.1–34.0)
MCHC: 33.2 g/dL (ref 31.5–36.0)
MCV: 89.8 fL (ref 79.5–101.0)
MONO#: 0.3 10*3/uL (ref 0.1–0.9)
MONO%: 14.1 % — ABNORMAL HIGH (ref 0.0–14.0)
NEUT#: 1.3 10*3/uL — ABNORMAL LOW (ref 1.5–6.5)
NEUT%: 57.1 % (ref 38.4–76.8)
Platelets: 247 10*3/uL (ref 145–400)
RBC: 3.68 10*6/uL — ABNORMAL LOW (ref 3.70–5.45)
RDW: 13.3 % (ref 11.2–14.5)
WBC: 2.3 10*3/uL — ABNORMAL LOW (ref 3.9–10.3)
lymph#: 0.6 10*3/uL — ABNORMAL LOW (ref 0.9–3.3)

## 2014-12-13 LAB — COMPREHENSIVE METABOLIC PANEL (CC13)
ALBUMIN: 3.4 g/dL — AB (ref 3.5–5.0)
ALT: 25 U/L (ref 0–55)
AST: 32 U/L (ref 5–34)
Alkaline Phosphatase: 75 U/L (ref 40–150)
Anion Gap: 12 mEq/L — ABNORMAL HIGH (ref 3–11)
BUN: 7.9 mg/dL (ref 7.0–26.0)
CHLORIDE: 101 meq/L (ref 98–109)
CO2: 31 mEq/L — ABNORMAL HIGH (ref 22–29)
CREATININE: 0.7 mg/dL (ref 0.6–1.1)
Calcium: 8.5 mg/dL (ref 8.4–10.4)
EGFR: >90 mL/min/{1.73_m2} (ref >90)
Glucose: 138 mg/dl (ref 70–140)
POTASSIUM: 3.1 meq/L — AB (ref 3.5–5.1)
Sodium: 144 mEq/L (ref 136–145)
Total Bilirubin: 0.45 mg/dL (ref 0.20–1.20)
Total Protein: 6.4 g/dL (ref 6.4–8.3)

## 2014-12-13 MED ORDER — SODIUM CHLORIDE 0.9 % IJ SOLN
10.0000 mL | INTRAMUSCULAR | Status: DC | PRN
  Administered 2014-12-13: 10 mL via INTRAVENOUS
  Filled 2014-12-13: qty 10

## 2014-12-13 MED ORDER — HEPARIN SOD (PORK) LOCK FLUSH 100 UNIT/ML IV SOLN
500.0000 [IU] | Freq: Once | INTRAVENOUS | Status: AC | PRN
  Administered 2014-12-13: 500 [IU]
  Filled 2014-12-13: qty 5

## 2014-12-13 MED ORDER — SODIUM CHLORIDE 0.9 % IJ SOLN
10.0000 mL | INTRAMUSCULAR | Status: DC | PRN
  Administered 2014-12-13: 10 mL
  Filled 2014-12-13: qty 10

## 2014-12-13 MED ORDER — SODIUM CHLORIDE 0.9 % IV SOLN
Freq: Once | INTRAVENOUS | Status: AC
  Administered 2014-12-13: 15:00:00 via INTRAVENOUS
  Filled 2014-12-13: qty 4

## 2014-12-13 MED ORDER — SODIUM CHLORIDE 0.9 % IV SOLN
1.4200 mg/m2 | Freq: Once | INTRAVENOUS | Status: AC
  Administered 2014-12-13: 3 mg via INTRAVENOUS
  Filled 2014-12-13: qty 6

## 2014-12-13 MED ORDER — PEGFILGRASTIM 6 MG/0.6ML ~~LOC~~ PSKT
6.0000 mg | PREFILLED_SYRINGE | Freq: Once | SUBCUTANEOUS | Status: AC
  Administered 2014-12-13: 6 mg via SUBCUTANEOUS
  Filled 2014-12-13: qty 0.6

## 2014-12-13 MED ORDER — SODIUM CHLORIDE 0.9 % IV SOLN
Freq: Once | INTRAVENOUS | Status: AC
  Administered 2014-12-13: 14:00:00 via INTRAVENOUS

## 2014-12-13 NOTE — Progress Notes (Signed)
Patient Care Team: Lucretia Kern, DO as PCP - General (Family Medicine)  DIAGNOSIS: Breast cancer of upper-outer quadrant of left female breast   Staging form: Breast, AJCC 7th Edition     Clinical: Stage IIIB (T4, N1, cM0) - Signed by Thea Silversmith, MD on 10/29/2012       Prognostic indicators: ER-, PR- HER2 -      Pathologic: No stage assigned - Unsigned       Prognostic indicators: ER-, PR- HER2 -    SUMMARY OF ONCOLOGIC HISTORY:   Breast cancer of upper-outer quadrant of left female breast   10/06/1986 Initial Diagnosis Right breast mastectomy, breast cancer diagnosed during pregnancy, no adjuvant treatment given   09/30/2012 Initial Biopsy Left breast biopsy done for left nipple retraction: Invasive ductal carcinoma grade 3, ER/PR negative, HER-2 negative, Ki-67 21% with grade 3 DCIS   10/12/2012 Breast MRI Left breast diffuse enhancement measuring 13 x 12.5 x 9.5 cm with diffuse skin thickening and dermal enhancement, multiple enlarged level I left axillary lymph nodes largest 2.4 cm   11/02/2012 - 06/06/2013 Neo-Adjuvant Chemotherapy Neoadjuvant FEC 4 followed by Taxol carbo 8 discontinued for neuropathy, Gemzar Carbo 2   09/15/2013 - 11/01/2013 Radiation Therapy Xeloda with adjuvant radiation   10/30/2014 Imaging Right hilar node 1.6 cm, subcarinal 1.2 cm, 1 cm additional right hilar, right lower lobe 3.2 cm, RUL 1 cm, and LDL of 1.3 cm, moderate pleural effusion, right liver 0.8 cm, 15 cm fluid collection left chest wall   10/31/2014 Initial Biopsy Lung biopsy right lower lobe: Carcinoma with Extensive necrosis, microscopic foci of nonnecrotic non-small cell, poorly differentiated carcinoma, metastatic disease likely ER 0%, PR 0%, HER-2 negative   12/01/2014 Procedure Second thoracentesis for malignant pleural effusion   12/06/2014 -  Chemotherapy Halaven day 1 day 8 every 3 weeks    CHIEF COMPLIANT: Cycle 1 day 8 Halaven  INTERVAL HISTORY: Christine [name] is a 59 year old with  above-mentioned history of metastatic breast cancer who is currently on Halaven palliative chemotherapy. Today cycle 1 day 8. She had tolerated first dose of Halaven extremely well without any nausea or vomiting. She continues to have shortness of breath or exertion. She had repeat thoracentesis done 12/01/2014. After cycle 1 of chemotherapy, she had mild nausea. Her major complaint is shortness of breath which appears to be getting worse once again. She would like to have another thoracentesis if possible.  REVIEW OF SYSTEMS:   Constitutional: Denies fevers, chills or abnormal weight loss Eyes: Denies blurriness of vision Ears, nose, mouth, throat, and face: Denies mucositis or sore throat Respiratory: Shortness of breath to exertion Cardiovascular: Denies palpitation, chest discomfort or lower extremity swelling Gastrointestinal:  Denies nausea, heartburn or change in bowel habits Skin: Denies abnormal skin rashes Lymphatics: Denies new lymphadenopathy or easy bruising Neurological:Denies numbness, tingling or new weaknesses Behavioral/Psych: Mood is stable, no new changes  All other systems were reviewed with the patient and are negative.  I have reviewed the past medical history, past surgical history, social history and family history with the patient and they are unchanged from previous note.  ALLERGIES:  is allergic to sulfa antibiotics; sulfonamide derivatives; and tomato.  MEDICATIONS:  Current Outpatient Prescriptions  Medication Sig Dispense Refill  . cetirizine (ZYRTEC) 10 MG tablet Take 10 mg by mouth daily.    Christiane Ha Oil (CHIA SEED OIL EXTRACT PO) Take 1 each by mouth 4 (four) times a week.    . cholecalciferol (VITAMIN D) 1000 UNITS tablet  Take 1,000 Units by mouth every morning.     . diltiazem (CARDIZEM CD) 120 MG 24 hr capsule Take 1 capsule (120 mg total) by mouth every morning. 30 capsule 0  . esomeprazole (NEXIUM) 20 MG capsule Take 20 mg by mouth daily at 12 noon.     . Flaxseed, Linseed, (FLAX SEEDS PO) Take 1 each by mouth 4 (four) times a week.    . fluticasone (FLONASE) 50 MCG/ACT nasal spray Place 2 sprays into both nostrils daily as needed for allergies or rhinitis.    Marland Kitchen latanoprost (XALATAN) 0.005 % ophthalmic solution Place 1 drop into both eyes at bedtime.     Marland Kitchen levothyroxine (SYNTHROID, LEVOTHROID) 50 MCG tablet Take 1 tablet (50 mcg total) by mouth daily. 90 tablet 3  . lidocaine-prilocaine (EMLA) cream Apply to affected area once 30 g 3  . LORazepam (ATIVAN) 0.5 MG tablet Take 1 tablet (0.5 mg total) by mouth every 6 (six) hours as needed (Nausea or vomiting). 30 tablet 0  . metFORMIN (GLUCOPHAGE) 500 MG tablet Take $RemoveBef'1000mg'oXcfmXWhHZ$  (2 tabs) in the morning and $RemoveBef'500mg'HbnMwOBYzr$  (1 tab) in the evening with meals 135 tablet 3  . naproxen sodium (ANAPROX) 220 MG tablet Take 440 mg by mouth daily as needed (pain).    . NON FORMULARY Apply 1 application topically daily as needed (Applies to feet for athlete's foot.). Fungifoam    . ondansetron (ZOFRAN) 8 MG tablet Take 1 tablet (8 mg total) by mouth 2 (two) times daily. Start the day after chemo for 2 days. Then take as needed for nausea or vomiting. 30 tablet 1  . oxyCODONE (OXY IR/ROXICODONE) 5 MG immediate release tablet Take 1 tablet (5 mg total) by mouth every 4 (four) hours as needed for moderate pain. 30 tablet 0  . prochlorperazine (COMPAZINE) 10 MG tablet Take 1 tablet (10 mg total) by mouth every 6 (six) hours as needed (Nausea or vomiting). 30 tablet 1  . Tetrahydrozoline HCl (VISINE OP) Apply 1-2 drops to eye daily as needed (itchy eyes.).     No current facility-administered medications for this visit.    PHYSICAL EXAMINATION: ECOG PERFORMANCE STATUS: 2 - Symptomatic, <50% confined to bed  Filed Vitals:   12/13/14 1336  BP: 134/64  Pulse: 107  Temp: 98.2 F (36.8 C)  Resp: 19   Filed Weights   12/13/14 1336  Weight: 212 lb 4.8 oz (96.299 kg)    GENERAL:alert, no distress and comfortable SKIN:  skin color, texture, turgor are normal, no rashes or significant lesions EYES: normal, Conjunctiva are pink and non-injected, sclera clear OROPHARYNX:no exudate, no erythema and lips, buccal mucosa, and tongue normal  NECK: supple, thyroid normal size, non-tender, without nodularity LYMPH:  no palpable lymphadenopathy in the cervical, axillary or inguinal LUNGS: Pleural effusion and dullness to percussion of the right lung base HEART: regular rate & rhythm and no murmurs and no lower extremity edema ABDOMEN:abdomen soft, non-tender and normal bowel sounds Musculoskeletal:no cyanosis of digits and no clubbing  NEURO: alert & oriented x 3 with fluent speech, no focal motor/sensory deficits  LABORATORY DATA:  I have reviewed the data as listed   Chemistry      Component Value Date/Time   NA 140 12/03/2014 0733   NA 143 11/28/2014 1332   K 3.7 12/03/2014 0733   K 3.7 11/28/2014 1332   CL 101 12/03/2014 0733   CL 101 01/25/2013 0952   CO2 29 12/03/2014 0733   CO2 21* 11/28/2014 1332   BUN  14 12/03/2014 0733   BUN 11.9 11/28/2014 1332   CREATININE 1.10* 12/03/2014 0733   CREATININE 1.0 11/28/2014 1332      Component Value Date/Time   CALCIUM 8.5* 12/03/2014 0733   CALCIUM 9.8 11/28/2014 1332   ALKPHOS 76 12/02/2014 0532   ALKPHOS 83 11/28/2014 1332   AST 28 12/02/2014 0532   AST 23 11/28/2014 1332   ALT 19 12/02/2014 0532   ALT 13 11/28/2014 1332   BILITOT 0.5 12/02/2014 0532   BILITOT 0.45 11/28/2014 1332       Lab Results  Component Value Date   WBC 2.3* 12/13/2014   HGB 11.0* 12/13/2014   HCT 33.0* 12/13/2014   MCV 89.8 12/13/2014   PLT 247 12/13/2014   NEUTROABS 1.3* 12/13/2014   on and there  ASSESSMENT & PLAN:  Breast cancer of upper-outer quadrant of left female breast 09/30/2012 :Stage III, triple negative, invasive ductal carcinoma of the left breast.  Neoadjuvant chemotherapy following the surgery followed by adjuvant radiation with concurrent Xeloda. 3.9  cm size and 9/13 lymph nodes were positive with extracapsular extension. ER/PR negative HER-2 negative Ki-67 21% March 2016: CT chest revealed bilateral lung nodules biopsy-proven to be triple negative metastatic carcinoma , pleural effusion status post thoracentesis malignant cells on cytology, repeat thoracentesis 12/01/2014 PET CT scan 11/27/2014 showed widespread metastatic disease to the thorax with multiple pulmonary nodules and masses and malignant right pleural effusion and right hilar and mediastinal malignant lymph nodes.  Current Treatment: Halaven (days 1,8 q 3 weeks) started 12/06/14. Today's cycle 1 day 8 Shortness of breath: Related to malignant pleural effusion, thoracentesis 2 last on 12/01/2014 patient currently has shortness of breath and would like another thoracentesis. I will contact pulmonology to discuss with her regarding Pleurx catheter placement and/or pleurodesis.   Halaven toxicities: 1. Neutropenia ANC 1300: I recommended that she get Neulasta injection after today's chemotherapy. 2. Chemotherapy-induced anemia 3. Mild nausea   Return to clinic in  2 weeks for cycle 2.   No orders of the defined types were placed in this encounter.   The patient has a good understanding of the overall plan. she agrees with it. she will call with any problems that may develop before the next visit here.   Rulon Eisenmenger, MD

## 2014-12-13 NOTE — Addendum Note (Signed)
Addended by: Wyline Beady A on: 12/13/2014 02:55 PM   Modules accepted: Medications

## 2014-12-13 NOTE — Patient Instructions (Signed)

## 2014-12-13 NOTE — Telephone Encounter (Signed)
Appointments made and avs printed for patient °

## 2014-12-13 NOTE — Patient Instructions (Addendum)
[facility] Discharge Instructions for Patients Receiving Chemotherapy  Today you received the following chemotherapy agents Halaven and  Nuelasta On-Pro.  To help prevent nausea and vomiting after your treatment, we encourage you to take your nausea medication as prescribed.   If you develop nausea and vomiting that is not controlled by your nausea medication, call the clinic.   BELOW ARE SYMPTOMS THAT SHOULD BE REPORTED IMMEDIATELY:  *FEVER GREATER THAN 100.5 F  *CHILLS WITH OR WITHOUT FEVER  NAUSEA AND VOMITING THAT IS NOT CONTROLLED WITH YOUR NAUSEA MEDICATION  *UNUSUAL SHORTNESS OF BREATH  *UNUSUAL BRUISING OR BLEEDING  TENDERNESS IN MOUTH AND THROAT WITH OR WITHOUT PRESENCE OF ULCERS  *URINARY PROBLEMS  *BOWEL PROBLEMS  UNUSUAL RASH Items with * indicate a potential emergency and should be followed up as soon as possible.  Feel free to call the clinic you have any questions or concerns. The clinic phone number is [phone].  Please show the [facility] at check-in to the Emergency Department and triage nurse.

## 2014-12-13 NOTE — Progress Notes (Signed)
Per Lindi Adie it is okay to treat pt  today with today's labs. OnPro orderd for pt.

## 2014-12-13 NOTE — Telephone Encounter (Signed)
Appointments made and avs printed for patient,[facility] referral noted and they will call her

## 2014-12-14 ENCOUNTER — Ambulatory Visit (HOSPITAL_COMMUNITY)
Admission: RE | Admit: 2014-12-14 | Discharge: 2014-12-14 | Disposition: A | Discharge status: Home / Self Care | Payer: BLUE CROSS/BLUE SHIELD | Source: Ambulatory Visit | Attending: Hematology and Oncology | Admitting: Hematology and Oncology

## 2014-12-14 ENCOUNTER — Other Ambulatory Visit: Payer: Self-pay | Admitting: Hematology and Oncology

## 2014-12-14 ENCOUNTER — Ambulatory Visit (HOSPITAL_COMMUNITY)
Admission: RE | Admit: 2014-12-14 | Discharge: 2014-12-14 | Disposition: A | Discharge status: Home / Self Care | Payer: BLUE CROSS/BLUE SHIELD | Source: Ambulatory Visit | Attending: Radiology | Admitting: Radiology

## 2014-12-14 DIAGNOSIS — C50412 Malignant neoplasm of upper-outer quadrant of left female breast: Secondary | ICD-10-CM

## 2014-12-14 DIAGNOSIS — Z9889 Other specified postprocedural states: Secondary | ICD-10-CM

## 2014-12-14 DIAGNOSIS — R918 Other nonspecific abnormal finding of lung field: Secondary | ICD-10-CM | POA: Diagnosis not present

## 2014-12-14 DIAGNOSIS — J90 Pleural effusion, not elsewhere classified: Secondary | ICD-10-CM | POA: Diagnosis not present

## 2014-12-14 MED ORDER — LIDOCAINE HCL (PF) 1 % IJ SOLN
INTRAMUSCULAR | Status: AC
  Filled 2014-12-14: qty 10

## 2014-12-14 NOTE — Procedures (Signed)
Successful US guided right thoracentesis. Yielded 1.7 liters of blood tinged fluid. Pt tolerated procedure well. No immediate complications.  Specimen was not sent for labs. CXR ordered.  Tsosie Billing D PA-C 12/14/2014 12:57 PM

## 2014-12-15 ENCOUNTER — Telehealth: Payer: Self-pay | Admitting: Internal Medicine

## 2014-12-15 ENCOUNTER — Other Ambulatory Visit: Payer: Self-pay | Admitting: Hematology and Oncology

## 2014-12-15 DIAGNOSIS — J910 Malignant pleural effusion: Secondary | ICD-10-CM

## 2014-12-15 NOTE — Telephone Encounter (Signed)
Christine [name]  Please cancel this patient appt with me.   THanks  MR

## 2014-12-15 NOTE — Telephone Encounter (Signed)
Thanks Murali, Please cancel appointment. I will refer her to thoracic surgery Milta Croson

## 2014-12-15 NOTE — Telephone Encounter (Signed)
Vinay  No need for MIABELLA SHANNAHAN to see me 12/27/14. She can see thoracic surgery for VATS pleurodesis V PleurX cath due to already confirmed malignant effusiion. Let me know and I will cancel her OV with me  Thanks  Dr. Brand Males, M.D., [facility].C.P Pulmonary and Critical Care Medicine Staff Physician [facility] Pager: [phone], If no answer or between  15:00h - 7:00h: call [phone]  12/15/2014 6:29 AM

## 2014-12-18 ENCOUNTER — Telehealth: Payer: Self-pay | Admitting: Hematology and Oncology

## 2014-12-18 NOTE — Telephone Encounter (Signed)
Referral noted for [facility] and I altered the referral to fall into their work q and they will call the patient

## 2014-12-21 ENCOUNTER — Institutional Professional Consult (permissible substitution) (INDEPENDENT_AMBULATORY_CARE_PROVIDER_SITE_OTHER): Payer: BLUE CROSS/BLUE SHIELD | Admitting: Surgery

## 2014-12-21 ENCOUNTER — Encounter: Payer: Self-pay | Admitting: Surgery

## 2014-12-21 VITALS — BP 118/72 | HR 113 | Resp 20 | Ht 64.0 in | Wt 208.0 lb

## 2014-12-21 DIAGNOSIS — J910 Malignant pleural effusion: Secondary | ICD-10-CM

## 2014-12-21 NOTE — Progress Notes (Signed)
Cardiothoracic Surgery Consultation   PCP is Lucretia Kern., DO Referring Provider is Nicholas Lose, MD  Chief Complaint  Patient presents with  . Pleural Effusion    eval for pleural effusion, cta chest don e4/13, pet done 4/25    HPI:  The patient is a 59 year old woman with a history of stage III, triple negative, invasive ductal carcinoma of the left breast with bilateral lung nodules that are biopsy proven to be triple negative metastatic carcinoma and a recurrent malignant right pleural effusion. She has had a right thoracentesis three times on 4/15, 4/30, and 12/14/2014 removing 1.7 L of fluid most recently. She says she felt better after the first two procedures for a short time but then had recurrent shortness of breath and reaccumulation of the fluid. Since her most recent thoracentesis her breathing has remained improved. She thinks it may be because she has received more chemotherapy.  Past Medical History  Diagnosis Date  . ALLERGIC RHINITIS   . GERD (gastroesophageal reflux disease)   . Hypertension   . Hypothyroidism   . Hx of colonic polyps   . Wears glasses   . Breast cancer 25, age 78    right  . Breast cancer 09/30/12    left, ER/PR -, Her 2 -  . Radiation 09/15/13-11/01/13    Left chest wall/PAB/scar/supraclavicular fossa  . SYNCOPE 05/15/2010    Qualifier: Diagnosis of  By: Arnoldo Morale MD, [place], WITH RADICULOPATHY 10/01/2007    Qualifier: Diagnosis of  By: Arnoldo Morale MD, [place] 05/31/2007    Qualifier: Diagnosis of  By: Arnoldo Morale MD, [place], HX OF 05/31/2007    Qualifier: Diagnosis of  By: Arnoldo Morale MD, Balinda Quails   . Lymph edema L arm from breast ca tx 12/22/2013  . Diabetes mellitus without complication     Past Surgical History  Procedure Laterality Date  . Tonsillectomy    . Caesarean section      x 1  . Mastectomy  04/1987    right side  . Abdominal hysterectomy  06/1988    fibroids  . Portacath placement  Right 10/28/2012    Procedure: PORT PLACEMENT;  Surgeon: Joyice Faster. Cornett, MD;  Location: [facility];  Service: General;  Laterality: Right;  Right Subclavian Vein  . Mastectomy modified radical Left 07/19/2013    Procedure: MASTECTOMY MODIFIED RADICAL;  Surgeon: Marcello Moores A. Cornett, MD;  Location: [facility];  Service: General;  Laterality: Left;  . Port-a-cath removal Right 07/19/2013    Procedure: REMOVAL PORT-A-CATH;  Surgeon: Joyice Faster. Cornett, MD;  Location: [facility];  Service: General;  Laterality: Right;  . Breast surgery Left 07/19/13    MRM  . Cesarean section      Family History  Problem Relation Age of Onset  . Colon cancer Mother 36    family hx of colon ca 1st degree relative <60  . Hypertension Mother   . Coronary artery disease Father     family hx of CAD female 1st degree relative <50  . Stomach cancer Neg Hx   . Rectal cancer Neg Hx   . Esophageal cancer Neg Hx   . Breast cancer Maternal Grandmother 80  . Diabetes Paternal Grandmother   . Breast cancer Cousin 9    maternal cousin  . Coronary artery disease Maternal Grandfather   . Breast cancer Sister  paternal half sister; died in her 52s    Social History History  Substance Use Topics  . Smoking status: Never Smoker   . Smokeless tobacco: Never Used  . Alcohol Use: No    Current Outpatient Prescriptions  Medication Sig Dispense Refill  . cetirizine (ZYRTEC) 10 MG tablet Take 10 mg by mouth daily.    Christiane Ha Oil (CHIA SEED OIL EXTRACT PO) Take 1 each by mouth 4 (four) times a week.    . cholecalciferol (VITAMIN D) 1000 UNITS tablet Take 1,000 Units by mouth every morning.     . diltiazem (CARDIZEM CD) 120 MG 24 hr capsule Take 1 capsule (120 mg total) by mouth every morning. 30 capsule 0  . esomeprazole (NEXIUM) 20 MG capsule Take 20 mg by mouth daily at 12 noon.    . Flaxseed, Linseed, (FLAX SEEDS PO) Take 1 each by mouth 4 (four) times a week.    .  fluticasone (FLONASE) 50 MCG/ACT nasal spray Place 2 sprays into both nostrils daily as needed for allergies or rhinitis.    Marland Kitchen KLOR-CON M20 20 MEQ tablet TAKE 1 TABLET (20 MEQ TOTAL) BY MOUTH 2 (TWO) TIMES DAILY. 60 tablet 0  . latanoprost (XALATAN) 0.005 % ophthalmic solution Place 1 drop into both eyes at bedtime.     Marland Kitchen levothyroxine (SYNTHROID, LEVOTHROID) 50 MCG tablet Take 1 tablet (50 mcg total) by mouth daily. 90 tablet 3  . lidocaine-prilocaine (EMLA) cream Apply to affected area once 30 g 3  . LORazepam (ATIVAN) 0.5 MG tablet Take 1 tablet (0.5 mg total) by mouth every 6 (six) hours as needed (Nausea or vomiting). 30 tablet 0  . metFORMIN (GLUCOPHAGE) 500 MG tablet Take 1000mg  (2 tabs) in the morning and 500mg  (1 tab) in the evening with meals 135 tablet 3  . naproxen sodium (ANAPROX) 220 MG tablet Take 440 mg by mouth daily as needed (pain).    . NON FORMULARY Apply 1 application topically daily as needed (Applies to feet for athlete's foot.). Fungifoam    . ondansetron (ZOFRAN) 8 MG tablet Take 1 tablet (8 mg total) by mouth 2 (two) times daily. Start the day after chemo for 2 days. Then take as needed for nausea or vomiting. 30 tablet 1  . oxyCODONE (OXY IR/ROXICODONE) 5 MG immediate release tablet Take 1 tablet (5 mg total) by mouth every 4 (four) hours as needed for moderate pain. 30 tablet 0  . prochlorperazine (COMPAZINE) 10 MG tablet Take 1 tablet (10 mg total) by mouth every 6 (six) hours as needed (Nausea or vomiting). 30 tablet 1  . Tetrahydrozoline HCl (VISINE OP) Apply 1-2 drops to eye daily as needed (itchy eyes.).     No current facility-administered medications for this visit.    Allergies  Allergen Reactions  . Sulfa Antibiotics Hives, Itching and Swelling  . Sulfonamide Derivatives Hives, Itching and Swelling  . Tomato     Review of Systems  Constitutional: Positive for activity change, appetite change, fatigue and unexpected weight change.       Weight loss    HENT: Negative.   Eyes: Negative.   Respiratory: Positive for cough and shortness of breath.        Using oxygen at home  Cardiovascular: Negative for chest pain, palpitations and leg swelling.  Gastrointestinal:       Hiatal hernia and reflux  Endocrine: Negative.   Genitourinary: Negative.   Musculoskeletal: Positive for joint swelling.       Left hand  Skin: Negative.   Neurological:       Neuropathy in hands and feet  Hematological: Negative.   Psychiatric/Behavioral: The patient is nervous/anxious.     BP 118/72 mmHg  Pulse 113  Resp 20  Ht 5\' 4"  (1.626 m)  Wt 208 lb (94.348 kg)  BMI 35.69 kg/m2  SpO2 92% Physical Exam  Constitutional: She is oriented to person, place, and time. She appears well-developed and well-nourished. No distress.  HENT:  Head: Normocephalic and atraumatic.  Mouth/Throat: Oropharynx is clear and moist.  Eyes: EOM are normal.  Neck: Normal range of motion. Neck supple. No JVD present. No thyromegaly present.  Cardiovascular: Normal rate, regular rhythm and normal heart sounds.   No murmur heard. Pulmonary/Chest: Effort normal.  Decreased breath sounds over RLL  Abdominal: Soft. Bowel sounds are normal. She exhibits no distension and no mass. There is no tenderness.  Musculoskeletal: Normal range of motion. She exhibits edema.  Left arm lymphedema since her surgery  Lymphadenopathy:    She has no cervical adenopathy.  Neurological: She is alert and oriented to person, place, and time.  Skin: Skin is warm and dry.  Psychiatric: She has a normal mood and affect.     Diagnostic Tests:  CLINICAL DATA: Subsequent treatment strategy for metastatic breast cancer. Restaging examination.  EXAM: NUCLEAR MEDICINE PET SKULL BASE TO THIGH  TECHNIQUE: 10.5 mCi F-18 FDG was injected intravenously. Full-ring PET imaging was performed from the skull base to thigh after the radiotracer. CT data was obtained and used for attenuation correction  and anatomic localization.  FASTING BLOOD GLUCOSE: Value: 122 mg/dl  COMPARISON: CT of the chest, abdomen and pelvis 10/30/2014. PET-CT 10/27/2012.  FINDINGS: NECK  No hypermetabolic lymph nodes in the neck.  CHEST  Numerous hypermetabolic nodules and masses are noted scattered throughout the lungs bilaterally, compatible with widespread metastatic disease. The largest conglomerate of hypermetabolism is in the right middle lobe with where there is a hypermetabolic (SUVmax = 54.0) mass estimated to measure approximately 3.5 x 4.6 cm (image 85 of series 4). Multiple other smaller pulmonary nodules are also noted, also or hypermetabolic. Large right-sided pleural effusion with extensive right pleural hypermetabolism (SUVmax = 3.8-4.3), compatible with a malignant pleural effusion. This is associated with extensive passive atelectasis in the right lung, particularly in the right lower lobe. Extensive right hilar and mediastinal hypermetabolic lymphadenopathy, difficult to measure on today's noncontrast CT examination. Postoperative changes of left-sided modified radical mastectomy and left axillary nodal dissection are noted, with what appears to be a chronic postoperative fluid collection in the left chest wall, similar to the prior examination, presumably a large seroma. No significant hypermetabolism is noted to suggest local recurrence of disease. Right internal jugular single-lumen porta cath with tip terminating in the right atrium.  ABDOMEN/PELVIS  No abnormal hypermetabolic activity within the liver, pancreas, adrenal glands, or spleen. No hypermetabolic lymph nodes in the abdomen or pelvis. 3 cm partially calcified gallstone in the gallbladder. No current findings to suggest an acute cholecystitis at this time. No significant volume of ascites. No pneumoperitoneum. No pathologic distention of small bowel or colon. Numerous colonic diverticulae are noted,  without surrounding inflammatory changes to suggest an acute diverticulitis at this time. Status post hysterectomy. Ovaries are unremarkable in appearance.  SKELETON  No focal hypermetabolic activity to suggest skeletal metastasis.  IMPRESSION: 1. Widespread metastatic disease to the thorax, as evidenced by numerous pulmonary nodules and masses, a malignant right pleural effusion, and right hilar and mediastinal malignant lymphadenopathy,  as detailed above. 2. No definite signs of other metastatic disease in the neck, abdomen or pelvis. 3. No definite skeletal metastases. 4. Cholelithiasis without evidence of acute cholecystitis. 5. Colonic diverticulosis without findings to suggest acute diverticulitis at this time. 6. Additional incidental findings, as above.   Electronically Signed  By: Vinnie Langton M.D.  On: 11/27/2014 09:46  Impression:  She has a recurrent malignant right pleural effusion from metastatic breast cancer. She has had thoracentesis x 3 over a short period of time and I think it is likely that this effusion will reaccumulate. Her last CXR on record was done on 5/12 after her thoracentesis and shows only a small residual effusion. She denies any shortness of breath at this time. I think it would be best to repeat her CXR in a week and plan to insert a PleurX catheter if the effusion progresses. She may then need a talc pleurodesis.  Plan:  I will see her back in 1 week with a CXR. If her breathing worsens she will call me so that we can do a CXR and see her immediately. I would like to insert a PleurX catheter before another thoracentesis is done.  Gaye Pollack, MD [facility] [phone]

## 2014-12-21 NOTE — Telephone Encounter (Signed)
Pt returned call - [phone]

## 2014-12-21 NOTE — Telephone Encounter (Signed)
Appt canceled. LMTCB for pt.

## 2014-12-21 NOTE — Telephone Encounter (Signed)
Pt is aware and she saw TCTS today. Nothing further needed

## 2014-12-22 ENCOUNTER — Encounter: Payer: Self-pay | Admitting: *Deleted

## 2014-12-27 ENCOUNTER — Other Ambulatory Visit: Payer: Self-pay | Admitting: Surgery

## 2014-12-27 ENCOUNTER — Ambulatory Visit (HOSPITAL_BASED_OUTPATIENT_CLINIC_OR_DEPARTMENT_OTHER): Payer: BLUE CROSS/BLUE SHIELD

## 2014-12-27 ENCOUNTER — Encounter: Payer: Self-pay | Admitting: Surgery

## 2014-12-27 ENCOUNTER — Encounter: Payer: Self-pay | Admitting: Gastroenterology

## 2014-12-27 ENCOUNTER — Institutional Professional Consult (permissible substitution): Payer: BLUE CROSS/BLUE SHIELD | Admitting: Internal Medicine

## 2014-12-27 ENCOUNTER — Other Ambulatory Visit: Payer: Self-pay | Admitting: *Deleted

## 2014-12-27 ENCOUNTER — Other Ambulatory Visit (HOSPITAL_BASED_OUTPATIENT_CLINIC_OR_DEPARTMENT_OTHER): Payer: BLUE CROSS/BLUE SHIELD

## 2014-12-27 ENCOUNTER — Ambulatory Visit (HOSPITAL_BASED_OUTPATIENT_CLINIC_OR_DEPARTMENT_OTHER): Payer: BLUE CROSS/BLUE SHIELD | Admitting: Hematology and Oncology

## 2014-12-27 ENCOUNTER — Ambulatory Visit (INDEPENDENT_AMBULATORY_CARE_PROVIDER_SITE_OTHER): Payer: BLUE CROSS/BLUE SHIELD | Admitting: Surgery

## 2014-12-27 ENCOUNTER — Telehealth: Payer: Self-pay | Admitting: Hematology and Oncology

## 2014-12-27 ENCOUNTER — Ambulatory Visit: Payer: BLUE CROSS/BLUE SHIELD

## 2014-12-27 ENCOUNTER — Ambulatory Visit
Admission: RE | Admit: 2014-12-27 | Discharge: 2014-12-27 | Disposition: A | Discharge status: Home / Self Care | Payer: BLUE CROSS/BLUE SHIELD | Source: Ambulatory Visit | Attending: Surgery | Admitting: Surgery

## 2014-12-27 VITALS — BP 154/95 | HR 114 | Resp 18 | Ht 64.0 in | Wt 206.0 lb

## 2014-12-27 VITALS — BP 128/67 | HR 108 | Temp 98.0°F | Resp 19 | Ht 64.0 in | Wt 206.0 lb

## 2014-12-27 DIAGNOSIS — C7801 Secondary malignant neoplasm of right lung: Secondary | ICD-10-CM

## 2014-12-27 DIAGNOSIS — D6959 Other secondary thrombocytopenia: Secondary | ICD-10-CM | POA: Diagnosis not present

## 2014-12-27 DIAGNOSIS — D6481 Anemia due to antineoplastic chemotherapy: Secondary | ICD-10-CM | POA: Diagnosis not present

## 2014-12-27 DIAGNOSIS — J910 Malignant pleural effusion: Secondary | ICD-10-CM

## 2014-12-27 DIAGNOSIS — D701 Agranulocytosis secondary to cancer chemotherapy: Secondary | ICD-10-CM | POA: Diagnosis not present

## 2014-12-27 DIAGNOSIS — Z5111 Encounter for antineoplastic chemotherapy: Secondary | ICD-10-CM

## 2014-12-27 DIAGNOSIS — C7802 Secondary malignant neoplasm of left lung: Secondary | ICD-10-CM

## 2014-12-27 DIAGNOSIS — C50412 Malignant neoplasm of upper-outer quadrant of left female breast: Secondary | ICD-10-CM | POA: Diagnosis not present

## 2014-12-27 DIAGNOSIS — C771 Secondary and unspecified malignant neoplasm of intrathoracic lymph nodes: Secondary | ICD-10-CM | POA: Diagnosis not present

## 2014-12-27 DIAGNOSIS — C50912 Malignant neoplasm of unspecified site of left female breast: Principal | ICD-10-CM

## 2014-12-27 DIAGNOSIS — C50911 Malignant neoplasm of unspecified site of right female breast: Secondary | ICD-10-CM

## 2014-12-27 DIAGNOSIS — C773 Secondary and unspecified malignant neoplasm of axilla and upper limb lymph nodes: Secondary | ICD-10-CM

## 2014-12-27 DIAGNOSIS — Z853 Personal history of malignant neoplasm of breast: Secondary | ICD-10-CM | POA: Diagnosis not present

## 2014-12-27 DIAGNOSIS — J90 Pleural effusion, not elsewhere classified: Secondary | ICD-10-CM

## 2014-12-27 DIAGNOSIS — C7800 Secondary malignant neoplasm of unspecified lung: Secondary | ICD-10-CM

## 2014-12-27 DIAGNOSIS — Z95828 Presence of other vascular implants and grafts: Secondary | ICD-10-CM

## 2014-12-27 LAB — COMPREHENSIVE METABOLIC PANEL (CC13)
ALK PHOS: 91 U/L (ref 40–150)
ALT: 21 U/L (ref 0–55)
AST: 33 U/L (ref 5–34)
Albumin: 3.6 g/dL (ref 3.5–5.0)
Anion Gap: 14 mEq/L — ABNORMAL HIGH (ref 3–11)
BILIRUBIN TOTAL: 0.85 mg/dL (ref 0.20–1.20)
BUN: 7.9 mg/dL (ref 7.0–26.0)
CO2: 24 meq/L (ref 22–29)
Calcium: 7.7 mg/dL — ABNORMAL LOW (ref 8.4–10.4)
Chloride: 107 mEq/L (ref 98–109)
Creatinine: 1 mg/dL (ref 0.6–1.1)
EGFR: 72 mL/min/{1.73_m2} — AB (ref >90)
Glucose: 116 mg/dl (ref 70–140)
POTASSIUM: 3.7 meq/L (ref 3.5–5.1)
SODIUM: 145 meq/L (ref 136–145)
TOTAL PROTEIN: 6.3 g/dL — AB (ref 6.4–8.3)

## 2014-12-27 LAB — CBC WITH DIFFERENTIAL/PLATELET
BASO%: 0.1 % (ref 0.0–2.0)
Basophils Absolute: 0 10*3/uL (ref 0.0–0.1)
EOS%: 0 % (ref 0.0–7.0)
Eosinophils Absolute: 0 10*3/uL (ref 0.0–0.5)
HEMATOCRIT: 34 % — AB (ref 34.8–46.6)
HGB: 10.9 g/dL — ABNORMAL LOW (ref 11.6–15.9)
LYMPH#: 1.4 10*3/uL (ref 0.9–3.3)
LYMPH%: 21.2 % (ref 14.0–49.7)
MCH: 30.2 pg (ref 25.1–34.0)
MCHC: 32.1 g/dL (ref 31.5–36.0)
MCV: 94.2 fL (ref 79.5–101.0)
MONO#: 0.6 10*3/uL (ref 0.1–0.9)
MONO%: 8.8 % (ref 0.0–14.0)
NEUT%: 69.9 % (ref 38.4–76.8)
NEUTROS ABS: 4.7 10*3/uL (ref 1.5–6.5)
PLATELETS: 137 10*3/uL — AB (ref 145–400)
RBC: 3.61 10*6/uL — ABNORMAL LOW (ref 3.70–5.45)
RDW: 17.2 % — ABNORMAL HIGH (ref 11.2–14.5)
WBC: 6.8 10*3/uL (ref 3.9–10.3)
nRBC: 5 % — ABNORMAL HIGH (ref 0–0)

## 2014-12-27 MED ORDER — SODIUM CHLORIDE 0.9 % IV SOLN
Freq: Once | INTRAVENOUS | Status: AC
  Administered 2014-12-27: 13:00:00 via INTRAVENOUS

## 2014-12-27 MED ORDER — SODIUM CHLORIDE 0.9 % IV SOLN
1.4200 mg/m2 | Freq: Once | INTRAVENOUS | Status: AC
  Administered 2014-12-27: 3 mg via INTRAVENOUS
  Filled 2014-12-27: qty 6

## 2014-12-27 MED ORDER — SODIUM CHLORIDE 0.9 % IJ SOLN
10.0000 mL | INTRAMUSCULAR | Status: DC | PRN
  Administered 2014-12-27: 10 mL
  Filled 2014-12-27: qty 10

## 2014-12-27 MED ORDER — DEXAMETHASONE SODIUM PHOSPHATE 100 MG/10ML IJ SOLN
Freq: Once | INTRAMUSCULAR | Status: AC
  Administered 2014-12-27: 13:00:00 via INTRAVENOUS
  Filled 2014-12-27: qty 4

## 2014-12-27 MED ORDER — HEPARIN SOD (PORK) LOCK FLUSH 100 UNIT/ML IV SOLN
500.0000 [IU] | Freq: Once | INTRAVENOUS | Status: AC | PRN
  Administered 2014-12-27: 500 [IU]
  Filled 2014-12-27: qty 5

## 2014-12-27 MED ORDER — SODIUM CHLORIDE 0.9 % IJ SOLN
10.0000 mL | INTRAMUSCULAR | Status: DC | PRN
  Administered 2014-12-27: 10 mL via INTRAVENOUS
  Filled 2014-12-27: qty 10

## 2014-12-27 NOTE — Patient Instructions (Signed)

## 2014-12-27 NOTE — Patient Instructions (Signed)
Cancer Center Discharge Instructions for Patients Receiving Chemotherapy  Today you received the following chemotherapy agents Hal[place].  To help prevent nausea and vomiting after your treatment, we encourage you to take your nausea medication as prescribed.   If you develop nausea and vomiting that is not controlled by your nausea medication, call the clinic.   BELOW ARE SYMPTOMS THAT SHOULD BE REPORTED IMMEDIATELY:  *FEVER GREATER THAN 100.5 F  *CHILLS WITH OR WITHOUT FEVER  NAUSEA AND VOMITING THAT IS NOT CONTROLLED WITH YOUR NAUSEA MEDICATION  *UNUSUAL SHORTNESS OF BREATH  *UNUSUAL BRUISING OR BLEEDING  TENDERNESS IN MOUTH AND THROAT WITH OR WITHOUT PRESENCE OF ULCERS  *URINARY PROBLEMS  *BOWEL PROBLEMS  UNUSUAL RASH Items with * indicate a potential emergency and should be followed up as soon as possible.  Feel free to call the clinic you have any questions or concerns. The clinic phone number is [phone].  Please show the CHEMO ALERT CARD at check-in to the Emergency Department and triage nurse.   

## 2014-12-27 NOTE — Progress Notes (Signed)
Patient Care Team: Lucretia Kern, DO as PCP - General (Family Medicine)  DIAGNOSIS: Breast cancer of upper-outer quadrant of left female breast   Staging form: Breast, AJCC 7th Edition     Clinical: Stage IIIB (T4, N1, cM0) - Signed by Thea Silversmith, MD on 10/29/2012       Prognostic indicators: ER-, PR- HER2 -      Pathologic: No stage assigned - Unsigned       Prognostic indicators: ER-, PR- HER2 -    SUMMARY OF ONCOLOGIC HISTORY:   Breast cancer of upper-outer quadrant of left female breast   10/06/1986 Initial Diagnosis Right breast mastectomy, breast cancer diagnosed during pregnancy, no adjuvant treatment given   09/30/2012 Initial Biopsy Left breast biopsy done for left nipple retraction: Invasive ductal carcinoma grade 3, ER/PR negative, HER-2 negative, Ki-67 21% with grade 3 DCIS   10/12/2012 Breast MRI Left breast diffuse enhancement measuring 13 x 12.5 x 9.5 cm with diffuse skin thickening and dermal enhancement, multiple enlarged level I left axillary lymph nodes largest 2.4 cm   11/02/2012 - 06/06/2013 Neo-Adjuvant Chemotherapy Neoadjuvant FEC 4 followed by Taxol carbo 8 discontinued for neuropathy, Gemzar Carbo 2   09/15/2013 - 11/01/2013 Radiation Therapy Xeloda with adjuvant radiation   10/30/2014 Imaging Right hilar node 1.6 cm, subcarinal 1.2 cm, 1 cm additional right hilar, right lower lobe 3.2 cm, RUL 1 cm, and LDL of 1.3 cm, moderate pleural effusion, right liver 0.8 cm, 15 cm fluid collection left chest wall   10/31/2014 Initial Biopsy Lung biopsy right lower lobe: Carcinoma with Extensive necrosis, microscopic foci of nonnecrotic non-small cell, poorly differentiated carcinoma, metastatic disease likely ER 0%, PR 0%, HER-2 negative   12/01/2014 Procedure Second thoracentesis for malignant pleural effusion   12/06/2014 -  Chemotherapy Halaven day 1 day 8 every 3 weeks    CHIEF COMPLIANT: Halaven cycle 2 day 1  INTERVAL HISTORY: Christine [name] is a 59 year old with  above-mentioned history of metastatic breast cancer with malignant pleural effusion currently on palliative chemotherapy with [place]. She had to undergo 3 thoracentesis the last one was 2 weeks ago and she feels that her breathing has improved significantly. She is no longer huffing and puffing. She has appointment today to see thoracic surgery to discuss the role of pleurodesis.  REVIEW OF SYSTEMS:   Constitutional: Denies fevers, chills or abnormal weight loss Eyes: Denies blurriness of vision Ears, nose, mouth, throat, and face: Denies mucositis or sore throat Respiratory: Shortness of breath uses nasal cannula oxygen intermittently. Cardiovascular: Denies palpitation, chest discomfort or lower extremity swelling Gastrointestinal:  Denies nausea, heartburn or change in bowel habits Skin: Denies abnormal skin rashes Lymphatics: Denies new lymphadenopathy or easy bruising Neurological:Denies numbness, tingling or new weaknesses Behavioral/Psych: Mood is stable, no new changes   All other systems were reviewed with the patient and are negative.  I have reviewed the past medical history, past surgical history, social history and family history with the patient and they are unchanged from previous note.  ALLERGIES:  is allergic to sulfa antibiotics; sulfonamide derivatives; and tomato.  MEDICATIONS:  Current Outpatient Prescriptions  Medication Sig Dispense Refill  . cetirizine (ZYRTEC) 10 MG tablet Take 10 mg by mouth daily.    Christiane Ha Oil (CHIA SEED OIL EXTRACT PO) Take 1 each by mouth 4 (four) times a week.    . cholecalciferol (VITAMIN D) 1000 UNITS tablet Take 1,000 Units by mouth every morning.     . diltiazem (CARDIZEM CD) 120 MG  24 hr capsule Take 1 capsule (120 mg total) by mouth every morning. 30 capsule 0  . esomeprazole (NEXIUM) 20 MG capsule Take 20 mg by mouth daily at 12 noon.    . Flaxseed, Linseed, (FLAX SEEDS PO) Take 1 each by mouth 4 (four) times a week.    .  fluticasone (FLONASE) 50 MCG/ACT nasal spray Place 2 sprays into both nostrils daily as needed for allergies or rhinitis.    Marland Kitchen KLOR-CON M20 20 MEQ tablet TAKE 1 TABLET (20 MEQ TOTAL) BY MOUTH 2 (TWO) TIMES DAILY. 60 tablet 0  . latanoprost (XALATAN) 0.005 % ophthalmic solution Place 1 drop into both eyes at bedtime.     Marland Kitchen levothyroxine (SYNTHROID, LEVOTHROID) 50 MCG tablet Take 1 tablet (50 mcg total) by mouth daily. 90 tablet 3  . lidocaine-prilocaine (EMLA) cream Apply to affected area once 30 g 3  . LORazepam (ATIVAN) 0.5 MG tablet Take 1 tablet (0.5 mg total) by mouth every 6 (six) hours as needed (Nausea or vomiting). 30 tablet 0  . metFORMIN (GLUCOPHAGE) 500 MG tablet Take $RemoveBef'1000mg'HZMLSPYzBi$  (2 tabs) in the morning and $RemoveBef'500mg'ZsmxMtxfXS$  (1 tab) in the evening with meals 135 tablet 3  . naproxen sodium (ANAPROX) 220 MG tablet Take 440 mg by mouth daily as needed (pain).    . NON FORMULARY Apply 1 application topically daily as needed (Applies to feet for athlete's foot.). Fungifoam    . ondansetron (ZOFRAN) 8 MG tablet Take 1 tablet (8 mg total) by mouth 2 (two) times daily. Start the day after chemo for 2 days. Then take as needed for nausea or vomiting. 30 tablet 1  . oxyCODONE (OXY IR/ROXICODONE) 5 MG immediate release tablet Take 1 tablet (5 mg total) by mouth every 4 (four) hours as needed for moderate pain. 30 tablet 0  . prochlorperazine (COMPAZINE) 10 MG tablet Take 1 tablet (10 mg total) by mouth every 6 (six) hours as needed (Nausea or vomiting). 30 tablet 1  . Tetrahydrozoline HCl (VISINE OP) Apply 1-2 drops to eye daily as needed (itchy eyes.).     No current facility-administered medications for this visit.   Facility-Administered Medications Ordered in Other Visits  Medication Dose Route Frequency Provider Last Rate Last Dose  . sodium chloride 0.9 % injection 10 mL  10 mL Intravenous PRN Nicholas Lose, MD   10 mL at 12/27/14 1143    PHYSICAL EXAMINATION: ECOG PERFORMANCE STATUS: 1 - Symptomatic but  completely ambulatory  Filed Vitals:   12/27/14 1150  BP: 128/67  Pulse: 108  Temp: 98 F (36.7 C)  Resp: 19   Filed Weights   12/27/14 1150  Weight: 206 lb (93.441 kg)    GENERAL:alert, no distress and comfortable SKIN: skin color, texture, turgor are normal, no rashes or significant lesions EYES: normal, Conjunctiva are pink and non-injected, sclera clear OROPHARYNX:no exudate, no erythema and lips, buccal mucosa, and tongue normal  NECK: supple, thyroid normal size, non-tender, without nodularity LYMPH:  no palpable lymphadenopathy in the cervical, axillary or inguinal LUNGS: Diminished breath sounds in the right lung base and dullness to percussion HEART: regular rate & rhythm and no murmurs and no lower extremity edema ABDOMEN:abdomen soft, non-tender and normal bowel sounds Musculoskeletal:no cyanosis of digits and no clubbing  NEURO: alert & oriented x 3 with fluent speech, no focal motor/sensory deficits  LABORATORY DATA:  I have reviewed the data as listed   Chemistry      Component Value Date/Time   NA 144 12/13/2014 1315  NA 140 12/03/2014 0733   K 3.1* 12/13/2014 1315   K 3.7 12/03/2014 0733   CL 101 12/03/2014 0733   CL 101 01/25/2013 0952   CO2 31* 12/13/2014 1315   CO2 29 12/03/2014 0733   BUN 7.9 12/13/2014 1315   BUN 14 12/03/2014 0733   CREATININE 0.7 12/13/2014 1315   CREATININE 1.10* 12/03/2014 0733      Component Value Date/Time   CALCIUM 8.5 12/13/2014 1315   CALCIUM 8.5* 12/03/2014 0733   ALKPHOS 75 12/13/2014 1315   ALKPHOS 76 12/02/2014 0532   AST 32 12/13/2014 1315   AST 28 12/02/2014 0532   ALT 25 12/13/2014 1315   ALT 19 12/02/2014 0532   BILITOT 0.45 12/13/2014 1315   BILITOT 0.5 12/02/2014 0532       Lab Results  Component Value Date   WBC 6.8 12/27/2014   HGB 10.9* 12/27/2014   HCT 34.0* 12/27/2014   MCV 94.2 12/27/2014   PLT 137* 12/27/2014   NEUTROABS 4.7 12/27/2014    ASSESSMENT & PLAN:  Breast cancer of  upper-outer quadrant of left female breast 09/30/2012 :Stage III, triple negative, invasive ductal carcinoma of the left breast.  Neoadjuvant chemotherapy following the surgery followed by adjuvant radiation with concurrent Xeloda. 3.9 cm size and 9/13 lymph nodes were positive with extracapsular extension. ER/PR negative HER-2 negative Ki-67 21% March 2016: CT chest revealed bilateral lung nodules biopsy-proven to be triple negative metastatic carcinoma , pleural effusion status post thoracentesis malignant cells on cytology, repeat thoracentesis 12/01/2014 and 12/12/2014 PET CT scan 11/27/2014 showed widespread metastatic disease to the thorax with multiple pulmonary nodules and masses and malignant right pleural effusion and right hilar and mediastinal malignant lymph nodes.  Current Treatment: Cycle 2 day 1 Halaven (days 1,8 q 3 weeks) started 12/06/14 Shortness of breath: Related to malignant pleural effusion status post 3 thoracentesis. She is currently on oxygen by nasal cannula. She is seeing thoracic surgery today to see if she needs pleurodesis  Halaven toxicities: 1. Neutropenia: We started giving her Neulasta on day 8 of each cycle 2. Mild thrombocytopenia 3. Anemia grade 1   Return to clinic in  3 weeks for cycle 3 with NP Will order Ct chest scans after cycle 3.   No orders of the defined types were placed in this encounter.   The patient has a good understanding of the overall plan. she agrees with it. she will call with any problems that may develop before the next visit here.   Rulon Eisenmenger, MD     Bilateral humeri

## 2014-12-27 NOTE — Telephone Encounter (Signed)
Appointments made and avs will be printed for patient in chemo °

## 2014-12-27 NOTE — Assessment & Plan Note (Signed)
09/30/2012 :Stage III, triple negative, invasive ductal carcinoma of the left breast.  Neoadjuvant chemotherapy following the surgery followed by adjuvant radiation with concurrent Xeloda. 3.9 cm size and 9/13 lymph nodes were positive with extracapsular extension. ER/PR negative HER-2 negative Ki-67 21% March 2016: CT chest revealed bilateral lung nodules biopsy-proven to be triple negative metastatic carcinoma , pleural effusion status post thoracentesis malignant cells on cytology, repeat thoracentesis 12/01/2014 PET CT scan 11/27/2014 showed widespread metastatic disease to the thorax with multiple pulmonary nodules and masses and malignant right pleural effusion and right hilar and mediastinal malignant lymph nodes.  Current Treatment: Cycle 2 day 1 Halaven (days 1,8 q 3 weeks) started 12/06/14 Shortness of breath: Related to malignant pleural effusion status post to thoracentesis. Discussed with her about pleurodesis but at this point she would like to see the chemotherapy would hold off on recurrent pleural effusion. She is currently on oxygen by nasal cannula.  Halaven toxicities:   Return to clinic in  3 weeks for cycle 3 with NP Will order Ct chest scans after cycle 3.

## 2014-12-28 ENCOUNTER — Ambulatory Visit: Payer: BLUE CROSS/BLUE SHIELD | Admitting: Surgery

## 2014-12-28 ENCOUNTER — Encounter: Payer: Self-pay | Admitting: Surgery

## 2014-12-28 NOTE — Progress Notes (Signed)
HPI:  The patient returns for follow up of her malignant right pleural effusion that has required thoracentesis 3 times. Since I initially saw her last week she has been feeling the same with no change in her breathing.  Current Outpatient Prescriptions  Medication Sig Dispense Refill  . cetirizine (ZYRTEC) 10 MG tablet Take 10 mg by mouth daily.    Christiane Ha Oil (CHIA SEED OIL EXTRACT PO) Take 1 each by mouth 4 (four) times a week.    . cholecalciferol (VITAMIN D) 1000 UNITS tablet Take 1,000 Units by mouth every morning.     . diltiazem (CARDIZEM CD) 120 MG 24 hr capsule Take 1 capsule (120 mg total) by mouth every morning. 30 capsule 0  . esomeprazole (NEXIUM) 20 MG capsule Take 20 mg by mouth daily at 12 noon.    . Flaxseed, Linseed, (FLAX SEEDS PO) Take 1 each by mouth 4 (four) times a week.    . fluticasone (FLONASE) 50 MCG/ACT nasal spray Place 2 sprays into both nostrils daily as needed for allergies or rhinitis.    Marland Kitchen KLOR-CON M20 20 MEQ tablet TAKE 1 TABLET (20 MEQ TOTAL) BY MOUTH 2 (TWO) TIMES DAILY. 60 tablet 0  . latanoprost (XALATAN) 0.005 % ophthalmic solution Place 1 drop into both eyes at bedtime.     Marland Kitchen levothyroxine (SYNTHROID, LEVOTHROID) 50 MCG tablet Take 1 tablet (50 mcg total) by mouth daily. 90 tablet 3  . lidocaine-prilocaine (EMLA) cream Apply to affected area once 30 g 3  . LORazepam (ATIVAN) 0.5 MG tablet Take 1 tablet (0.5 mg total) by mouth every 6 (six) hours as needed (Nausea or vomiting). 30 tablet 0  . metFORMIN (GLUCOPHAGE) 500 MG tablet Take 1000mg  (2 tabs) in the morning and 500mg  (1 tab) in the evening with meals 135 tablet 3  . naproxen sodium (ANAPROX) 220 MG tablet Take 440 mg by mouth daily as needed (pain).    . NON FORMULARY Apply 1 application topically daily as needed (Applies to feet for athlete's foot.). Fungifoam    . ondansetron (ZOFRAN) 8 MG tablet Take 1 tablet (8 mg total) by mouth 2 (two) times daily. Start the day after chemo for 2  days. Then take as needed for nausea or vomiting. 30 tablet 1  . oxyCODONE (OXY IR/ROXICODONE) 5 MG immediate release tablet Take 1 tablet (5 mg total) by mouth every 4 (four) hours as needed for moderate pain. 30 tablet 0  . prochlorperazine (COMPAZINE) 10 MG tablet Take 1 tablet (10 mg total) by mouth every 6 (six) hours as needed (Nausea or vomiting). 30 tablet 1  . Tetrahydrozoline HCl (VISINE OP) Apply 1-2 drops to eye daily as needed (itchy eyes.).     No current facility-administered medications for this visit.     Physical Exam: BP 154/95 mmHg  Pulse 114  Resp 18  Ht 5\' 4"  (1.626 m)  Wt 206 lb (93.441 kg)  BMI 35.34 kg/m2  SpO2 95% She looks well Lung exam shows marked decrease in breath sounds in the RLL.  Diagnostic Tests:  CLINICAL DATA: History of breast cancer with lung metastases. On chemotherapy. Pleural effusion shortness of breath.  EXAM: CHEST 2 VIEW  COMPARISON: 12/14/2014 and PET 11/27/2014.  FINDINGS: Trachea is midline. Heart size normal. Right IJ power port tip is likely in the high right atrium. Moderate right pleural effusion, increased from the prior exam, possibly somewhat loculated. Rounded masslike opacity seen at the base of the right hemi  thorax with airspace opacification in the right perihilar region. Bilateral pulmonary nodules seen on 11/27/2014 are not readily appreciated. Surgical clips in the left axilla.  IMPRESSION: 1. Enlarging right pleural effusion, possibly partially loculated, with masslike opacity at the base of the right hemi thorax, better evaluated on 11/27/2014. 2. Bilateral pulmonary nodules seen on 11/27/2014 are not readily appreciated. 3. Right perihilar airspace disease.   Electronically Signed  By: Lorin Picket M.D.  On: 12/27/2014 14:48  Impression:  The right pleural effusion is increasing and I think the best treatment is insertion of a PleurX catheter and talc pleurodesis if the lung  completely reexpands. I discussed the procedure with her including alternatives, benefits, and risks including but not limited to bleeding, infection, injury to the lung and recurrent pleural effusion. She understands and agrees to proceed.  Plan:  I will insert a right PleurX catheter on Tuesday 01/02/2015. I will reevaluate her at one week and if the catheter is still draining and the lung is completely expanded I will plan talc pleurodesis.   Gaye Pollack, MD [facility] [phone]

## 2014-12-29 ENCOUNTER — Other Ambulatory Visit (HOSPITAL_COMMUNITY): Payer: Self-pay | Admitting: *Deleted

## 2014-12-29 ENCOUNTER — Ambulatory Visit (HOSPITAL_COMMUNITY)
Admission: RE | Admit: 2014-12-29 | Discharge: 2014-12-29 | Disposition: A | Discharge status: Home / Self Care | Payer: BLUE CROSS/BLUE SHIELD | Source: Ambulatory Visit | Attending: Surgery | Admitting: Surgery

## 2014-12-29 ENCOUNTER — Encounter (HOSPITAL_COMMUNITY): Payer: Self-pay | Admitting: Pharmacy Technician

## 2014-12-29 ENCOUNTER — Encounter (HOSPITAL_COMMUNITY)
Admission: RE | Admit: 2014-12-29 | Discharge: 2014-12-29 | Disposition: A | Discharge status: Home / Self Care | Payer: BLUE CROSS/BLUE SHIELD | Source: Ambulatory Visit | Attending: Surgery | Admitting: Surgery

## 2014-12-29 ENCOUNTER — Encounter (HOSPITAL_COMMUNITY): Payer: Self-pay

## 2014-12-29 VITALS — BP 131/62 | Temp 98.3°F | Resp 18 | Ht 64.0 in | Wt 206.6 lb

## 2014-12-29 DIAGNOSIS — E119 Type 2 diabetes mellitus without complications: Secondary | ICD-10-CM | POA: Insufficient documentation

## 2014-12-29 DIAGNOSIS — Z01818 Encounter for other preprocedural examination: Secondary | ICD-10-CM | POA: Diagnosis not present

## 2014-12-29 DIAGNOSIS — J90 Pleural effusion, not elsewhere classified: Secondary | ICD-10-CM | POA: Diagnosis not present

## 2014-12-29 DIAGNOSIS — Z853 Personal history of malignant neoplasm of breast: Secondary | ICD-10-CM | POA: Insufficient documentation

## 2014-12-29 DIAGNOSIS — Z01812 Encounter for preprocedural laboratory examination: Secondary | ICD-10-CM | POA: Diagnosis not present

## 2014-12-29 DIAGNOSIS — I10 Essential (primary) hypertension: Secondary | ICD-10-CM | POA: Diagnosis not present

## 2014-12-29 HISTORY — DX: Reserved for inherently not codable concepts without codable children: IMO0001

## 2014-12-29 HISTORY — DX: Secondary malignant neoplasm of unspecified lung: C78.00

## 2014-12-29 HISTORY — DX: Adverse effect of antineoplastic and immunosuppressive drugs, initial encounter: G62.0

## 2014-12-29 HISTORY — DX: Anxiety disorder, unspecified: F41.9

## 2014-12-29 HISTORY — DX: Unspecified glaucoma: H40.9

## 2014-12-29 HISTORY — DX: Lymphedema, not elsewhere classified: I89.0

## 2014-12-29 HISTORY — DX: Drug-induced polyneuropathy: T45.1X5A

## 2014-12-29 HISTORY — DX: Personal history of other venous thrombosis and embolism: Z86.718

## 2014-12-29 HISTORY — DX: Anemia, unspecified: D64.9

## 2014-12-29 HISTORY — DX: Xerosis cutis: L85.3

## 2014-12-29 HISTORY — DX: Personal history of other diseases of the digestive system: Z87.19

## 2014-12-29 LAB — APTT: APTT: 26 s (ref 24–37)

## 2014-12-29 LAB — SURGICAL PCR SCREEN
MRSA, PCR: NEGATIVE
Staphylococcus aureus: NEGATIVE

## 2014-12-29 LAB — PROTIME-INR
INR: 1.11 (ref 0.00–1.49)
PROTHROMBIN TIME: 14.5 s (ref 11.6–15.2)

## 2014-12-29 LAB — GLUCOSE, CAPILLARY: GLUCOSE-CAPILLARY: 124 mg/dL — AB (ref 65–99)

## 2014-12-29 NOTE — Pre-Procedure Instructions (Signed)
Christine [name]  12/29/2014    Your procedure is scheduled on Tuesday, Jan 02, 2015 at 7:30 AM.   Report to [facility] Entrance "A" Admitting Office at 5:30 AM.   Call this number if you have problems the morning of surgery: [phone]     Remember:  Do not eat food or drink liquids after midnight Monday, 01/01/15.  Take these medicines the morning of surgery with A SIP OF WATER: Cetirizine (Zyrtec), Diltiazem (Cardizem CD), Levothyroxine (Synthroid), Lorazepam (Ativan) - if needed, Oxycodone - if needed, Flonase - if needed, Visine eye drops - if needed  Stop all Herbal medications and NSAIDS (Aleve, Ibuprofen, etc.) as of today.  Do NOT take Metformin the morning of surgery.   Do not wear jewelry, make-up or nail polish.  Do not wear lotions, powders, or perfumes.  You may NOT wear deodorant.  Do not shave 48 hours prior to surgery.   Do not bring valuables to the hospital.  [facility] is not responsible for any belongings or valuables.  Contacts, dentures or bridgework may not be worn into surgery.  Leave your suitcase in the car.  After surgery it may be brought to your room.  For patients admitted to the hospital, discharge time will be determined by your treatment team.  Patients discharged the day of surgery will not be allowed to drive home.   Special instructions:  [place] - Preparing for Surgery  Before surgery, you can play an important role.  Because skin is not sterile, your skin needs to be as free of germs as possible.  You can reduce the number of germs on you skin by washing with CHG (chlorahexidine gluconate) soap before surgery.  CHG is an antiseptic cleaner which kills germs and bonds with the skin to continue killing germs even after washing.  Please DO NOT use if you have an allergy to CHG or antibacterial soaps.  If your skin becomes reddened/irritated stop using the CHG and inform your nurse when you arrive at Short Stay.  Do not shave  (including legs and underarms) for at least 48 hours prior to the first CHG shower.  You may shave your face.  Please follow these instructions carefully:   1.  Shower with CHG Soap the night before surgery and the                                morning of Surgery.  2.  If you choose to wash your hair, wash your hair first as usual with your       normal shampoo.  3.  After you shampoo, rinse your hair and body thoroughly to remove the                      Shampoo.  4.  Use CHG as you would any other liquid soap.  You can apply chg directly       to the skin and wash gently with scrungie or a clean washcloth.  5.  Apply the CHG Soap to your body ONLY FROM THE NECK DOWN.        Do not use on open wounds or open sores.  Avoid contact with your eyes, ears, mouth and genitals (private parts).  Wash genitals (private parts) with your normal soap.  6.  Wash thoroughly, paying special attention to the area where your surgery  will be performed.  7.  Thoroughly rinse your body with warm water from the neck down.  8.  DO NOT shower/wash with your normal soap after using and rinsing off       the CHG Soap.  9.  Pat yourself dry with a clean towel.            10.  Wear clean pajamas.            11.  Place clean sheets on your bed the night of your first shower and do not        sleep with pets.  Day of Surgery  Do not apply any lotions/deodorants the morning of surgery.  Please wear clean clothes to the hospital.    Please read over the following fact sheets that you were given. Pain Booklet, Coughing and Deep Breathing, MRSA Information and Surgical Site Infection Prevention

## 2014-12-29 NOTE — Progress Notes (Signed)
   12/29/14 1138  OBSTRUCTIVE SLEEP APNEA  Have you ever been diagnosed with sleep apnea through a sleep study? No  Do you snore loudly (loud enough to be heard through closed doors)?  1  Do you often feel tired, fatigued, or sleepy during the daytime? 1  Has anyone observed you stop breathing during your sleep? 0  Do you have, or are you being treated for high blood pressure? 1  BMI more than 35 kg/m2? 1  Age over 59 years old? 1  Neck circumference greater than 40 cm/16 inches? 1  Gender: 0

## 2014-12-29 NOTE — Pre-Procedure Instructions (Signed)
Christine [name]  12/29/2014    Your procedure is scheduled on Tuesday, Jan 02, 2015 at 7:30 AM.   Report to [facility] Entrance "A" Admitting Office at 5:30 AM.   Call this number if you have problems the morning of surgery: [phone]     Remember:  Do not eat food or drink liquids after midnight Monday, 01/01/15.  Take these medicines the morning of surgery with A SIP OF WATER: Cetirizine (Zyrtec), Diltiazem (Cardizem CD), Levothyroxine (Synthroid), Lorazepam (Ativan) - if needed, Oxycodone - if needed, Flonase - if needed, Visine eye drops - if needed   Do not wear jewelry, make-up or nail polish.  Do not wear lotions, powders, or perfumes.  You may NOT wear deodorant.  Do not shave 48 hours prior to surgery.   Do not bring valuables to the hospital.  [facility] is not responsible for any belongings or valuables.  Contacts, dentures or bridgework may not be worn into surgery.  Leave your suitcase in the car.  After surgery it may be brought to your room.  For patients admitted to the hospital, discharge time will be determined by your treatment team.  Patients discharged the day of surgery will not be allowed to drive home.   Special instructions:  [place] - Preparing for Surgery  Before surgery, you can play an important role.  Because skin is not sterile, your skin needs to be as free of germs as possible.  You can reduce the number of germs on you skin by washing with CHG (chlorahexidine gluconate) soap before surgery.  CHG is an antiseptic cleaner which kills germs and bonds with the skin to continue killing germs even after washing.  Please DO NOT use if you have an allergy to CHG or antibacterial soaps.  If your skin becomes reddened/irritated stop using the CHG and inform your nurse when you arrive at Short Stay.  Do not shave (including legs and underarms) for at least 48 hours prior to the first CHG shower.  You may shave your face.  Please follow these  instructions carefully:   1.  Shower with CHG Soap the night before surgery and the                                morning of Surgery.  2.  If you choose to wash your hair, wash your hair first as usual with your       normal shampoo.  3.  After you shampoo, rinse your hair and body thoroughly to remove the                      Shampoo.  4.  Use CHG as you would any other liquid soap.  You can apply chg directly       to the skin and wash gently with scrungie or a clean washcloth.  5.  Apply the CHG Soap to your body ONLY FROM THE NECK DOWN.        Do not use on open wounds or open sores.  Avoid contact with your eyes, ears, mouth and genitals (private parts).  Wash genitals (private parts) with your normal soap.  6.  Wash thoroughly, paying special attention to the area where your surgery        will be performed.  7.  Thoroughly rinse your body with warm water from the neck down.  8.  DO NOT shower/wash with your normal soap after using and rinsing off       the CHG Soap.  9.  Pat yourself dry with a clean towel.            10.  Wear clean pajamas.            11.  Place clean sheets on your bed the night of your first shower and do not        sleep with pets.  Day of Surgery  Do not apply any lotions/deodorants the morning of surgery.  Please wear clean clothes to the hospital.    Please read over the following fact sheets that you were given. Pain Booklet, Coughing and Deep Breathing, MRSA Information and Surgical Site Infection Prevention

## 2015-01-01 MED ORDER — DEXTROSE 5 % IV SOLN
1.5000 g | INTRAVENOUS | Status: AC
  Administered 2015-01-02: 1.5 g via INTRAVENOUS
  Filled 2015-01-01: qty 1.5

## 2015-01-02 ENCOUNTER — Ambulatory Visit (HOSPITAL_COMMUNITY)
Admission: RE | Admit: 2015-01-02 | Discharge: 2015-01-02 | Disposition: A | Discharge status: Home / Self Care | Payer: BLUE CROSS/BLUE SHIELD | Source: Ambulatory Visit | Attending: Surgery | Admitting: Surgery

## 2015-01-02 ENCOUNTER — Ambulatory Visit (HOSPITAL_COMMUNITY): Payer: BLUE CROSS/BLUE SHIELD

## 2015-01-02 ENCOUNTER — Encounter (HOSPITAL_COMMUNITY): Payer: Self-pay | Admitting: *Deleted

## 2015-01-02 ENCOUNTER — Telehealth: Payer: Self-pay | Admitting: Family Medicine

## 2015-01-02 ENCOUNTER — Ambulatory Visit (HOSPITAL_COMMUNITY): Payer: BLUE CROSS/BLUE SHIELD | Admitting: Anesthesiology

## 2015-01-02 ENCOUNTER — Encounter (HOSPITAL_COMMUNITY)
Admission: RE | Disposition: A | Discharge status: Home / Self Care | Payer: Self-pay | Source: Ambulatory Visit | Attending: Surgery

## 2015-01-02 DIAGNOSIS — Z91018 Allergy to other foods: Secondary | ICD-10-CM | POA: Diagnosis not present

## 2015-01-02 DIAGNOSIS — F329 Major depressive disorder, single episode, unspecified: Secondary | ICD-10-CM | POA: Diagnosis not present

## 2015-01-02 DIAGNOSIS — Z86010 Personal history of colonic polyps: Secondary | ICD-10-CM | POA: Diagnosis not present

## 2015-01-02 DIAGNOSIS — E119 Type 2 diabetes mellitus without complications: Secondary | ICD-10-CM | POA: Diagnosis not present

## 2015-01-02 DIAGNOSIS — J910 Malignant pleural effusion: Secondary | ICD-10-CM | POA: Insufficient documentation

## 2015-01-02 DIAGNOSIS — F419 Anxiety disorder, unspecified: Secondary | ICD-10-CM | POA: Insufficient documentation

## 2015-01-02 DIAGNOSIS — I10 Essential (primary) hypertension: Secondary | ICD-10-CM | POA: Diagnosis not present

## 2015-01-02 DIAGNOSIS — Z90710 Acquired absence of both cervix and uterus: Secondary | ICD-10-CM | POA: Insufficient documentation

## 2015-01-02 DIAGNOSIS — Z882 Allergy status to sulfonamides status: Secondary | ICD-10-CM | POA: Diagnosis not present

## 2015-01-02 DIAGNOSIS — C50912 Malignant neoplasm of unspecified site of left female breast: Secondary | ICD-10-CM | POA: Insufficient documentation

## 2015-01-02 DIAGNOSIS — Z6835 Body mass index (BMI) 35.0-35.9, adult: Secondary | ICD-10-CM | POA: Diagnosis not present

## 2015-01-02 DIAGNOSIS — Z79899 Other long term (current) drug therapy: Secondary | ICD-10-CM | POA: Diagnosis not present

## 2015-01-02 DIAGNOSIS — K219 Gastro-esophageal reflux disease without esophagitis: Secondary | ICD-10-CM | POA: Insufficient documentation

## 2015-01-02 DIAGNOSIS — E039 Hypothyroidism, unspecified: Secondary | ICD-10-CM | POA: Insufficient documentation

## 2015-01-02 DIAGNOSIS — Z923 Personal history of irradiation: Secondary | ICD-10-CM | POA: Insufficient documentation

## 2015-01-02 DIAGNOSIS — J90 Pleural effusion, not elsewhere classified: Secondary | ICD-10-CM

## 2015-01-02 DIAGNOSIS — Z9012 Acquired absence of left breast and nipple: Secondary | ICD-10-CM | POA: Insufficient documentation

## 2015-01-02 DIAGNOSIS — C7802 Secondary malignant neoplasm of left lung: Secondary | ICD-10-CM | POA: Insufficient documentation

## 2015-01-02 DIAGNOSIS — C7801 Secondary malignant neoplasm of right lung: Secondary | ICD-10-CM | POA: Diagnosis not present

## 2015-01-02 HISTORY — PX: CHEST TUBE INSERTION: SHX231

## 2015-01-02 LAB — GLUCOSE, CAPILLARY
Glucose-Capillary: 137 mg/dL — ABNORMAL HIGH (ref 65–99)
Glucose-Capillary: 101 mg/dL — ABNORMAL HIGH (ref 65–99)

## 2015-01-02 SURGERY — INSERTION, PLEURAL DRAINAGE CATHETER
Anesthesia: Monitor Anesthesia Care | Site: Chest | Laterality: Right

## 2015-01-02 MED ORDER — ONDANSETRON HCL 4 MG/2ML IJ SOLN
INTRAMUSCULAR | Status: AC
  Filled 2015-01-02: qty 2

## 2015-01-02 MED ORDER — SUCCINYLCHOLINE CHLORIDE 20 MG/ML IJ SOLN
INTRAMUSCULAR | Status: AC
  Filled 2015-01-02: qty 1

## 2015-01-02 MED ORDER — EPHEDRINE SULFATE 50 MG/ML IJ SOLN
INTRAMUSCULAR | Status: AC
  Filled 2015-01-02: qty 1

## 2015-01-02 MED ORDER — ONDANSETRON HCL 4 MG/2ML IJ SOLN: 4.0000 mg | Freq: Four times a day (QID) | INTRAMUSCULAR | Status: DC | PRN

## 2015-01-02 MED ORDER — FENTANYL CITRATE (PF) 100 MCG/2ML IJ SOLN
INTRAMUSCULAR | Status: DC | PRN
  Administered 2015-01-02: 100 ug via INTRAVENOUS
  Administered 2015-01-02: 50 ug via INTRAVENOUS

## 2015-01-02 MED ORDER — MIDAZOLAM HCL 2 MG/2ML IJ SOLN
INTRAMUSCULAR | Status: AC
  Filled 2015-01-02: qty 2

## 2015-01-02 MED ORDER — MIDAZOLAM HCL 5 MG/5ML IJ SOLN
INTRAMUSCULAR | Status: DC | PRN
  Administered 2015-01-02: 2 mg via INTRAVENOUS

## 2015-01-02 MED ORDER — FENTANYL CITRATE (PF) 100 MCG/2ML IJ SOLN: 25.0000 ug | INTRAMUSCULAR | Status: DC | PRN

## 2015-01-02 MED ORDER — SODIUM CHLORIDE 0.9 % IJ SOLN
INTRAMUSCULAR | Status: AC
  Filled 2015-01-02: qty 10

## 2015-01-02 MED ORDER — GLYCOPYRROLATE 0.2 MG/ML IJ SOLN
INTRAMUSCULAR | Status: AC
  Filled 2015-01-02: qty 3

## 2015-01-02 MED ORDER — LACTATED RINGERS IV SOLN
INTRAVENOUS | Status: DC | PRN
  Administered 2015-01-02: 07:00:00 via INTRAVENOUS

## 2015-01-02 MED ORDER — 0.9 % SODIUM CHLORIDE (POUR BTL) OPTIME
TOPICAL | Status: DC | PRN
  Administered 2015-01-02: 1000 mL

## 2015-01-02 MED ORDER — LIDOCAINE HCL (CARDIAC) 20 MG/ML IV SOLN
INTRAVENOUS | Status: AC
  Filled 2015-01-02: qty 5

## 2015-01-02 MED ORDER — OXYCODONE HCL 5 MG PO TABS: 5.0000 mg | ORAL_TABLET | ORAL | Status: DC | PRN

## 2015-01-02 MED ORDER — PHENYLEPHRINE 40 MCG/ML (10ML) SYRINGE FOR IV PUSH (FOR BLOOD PRESSURE SUPPORT)
PREFILLED_SYRINGE | INTRAVENOUS | Status: AC
  Filled 2015-01-02: qty 10

## 2015-01-02 MED ORDER — OXYCODONE HCL 5 MG/5ML PO SOLN: 5.0000 mg | Freq: Once | ORAL | Status: DC | PRN

## 2015-01-02 MED ORDER — PROPOFOL 10 MG/ML IV BOLUS
INTRAVENOUS | Status: AC
  Filled 2015-01-02: qty 20

## 2015-01-02 MED ORDER — ONDANSETRON HCL 4 MG/2ML IJ SOLN
INTRAMUSCULAR | Status: DC | PRN
  Administered 2015-01-02: 4 mg via INTRAVENOUS

## 2015-01-02 MED ORDER — PROPOFOL INFUSION 10 MG/ML OPTIME
INTRAVENOUS | Status: DC | PRN
  Administered 2015-01-02: 50 ug/kg/min via INTRAVENOUS

## 2015-01-02 MED ORDER — ARTIFICIAL TEARS OP OINT
TOPICAL_OINTMENT | OPHTHALMIC | Status: AC
  Filled 2015-01-02: qty 3.5

## 2015-01-02 MED ORDER — NEOSTIGMINE METHYLSULFATE 10 MG/10ML IV SOLN
INTRAVENOUS | Status: AC
  Filled 2015-01-02: qty 1

## 2015-01-02 MED ORDER — LIDOCAINE 1 % OPTIME INJ - NO CHARGE
INTRAMUSCULAR | Status: DC | PRN
  Administered 2015-01-02: 7 mL

## 2015-01-02 MED ORDER — ROCURONIUM BROMIDE 50 MG/5ML IV SOLN
INTRAVENOUS | Status: AC
  Filled 2015-01-02: qty 1

## 2015-01-02 MED ORDER — FENTANYL CITRATE (PF) 250 MCG/5ML IJ SOLN
INTRAMUSCULAR | Status: AC
  Filled 2015-01-02: qty 5

## 2015-01-02 MED ORDER — OXYCODONE HCL 5 MG PO TABS: 5.0000 mg | ORAL_TABLET | Freq: Once | ORAL | Status: DC | PRN

## 2015-01-02 SURGICAL SUPPLY — 31 items
ADH SKN CLS APL DERMABOND .7 (GAUZE/BANDAGES/DRESSINGS) ×1
ADH SKN CLS LQ APL DERMABOND (GAUZE/BANDAGES/DRESSINGS) ×1
BRUSH SCRUB EZ PLAIN DRY (MISCELLANEOUS) ×3 IMPLANT
CANISTER SUCTION 2500CC (MISCELLANEOUS) ×3 IMPLANT
COVER SURGICAL LIGHT HANDLE (MISCELLANEOUS) ×3 IMPLANT
DERMABOND ADHESIVE PROPEN (GAUZE/BANDAGES/DRESSINGS) ×2
DERMABOND ADVANCED (GAUZE/BANDAGES/DRESSINGS) ×2
DERMABOND ADVANCED .7 DNX12 (GAUZE/BANDAGES/DRESSINGS) ×1 IMPLANT
DERMABOND ADVANCED .7 DNX6 (GAUZE/BANDAGES/DRESSINGS) IMPLANT
DRAPE C-ARM 42X72 X-RAY (DRAPES) ×3 IMPLANT
DRAPE LAPAROSCOPIC ABDOMINAL (DRAPES) ×3 IMPLANT
GLOVE EUDERMIC 7 POWDERFREE (GLOVE) ×3 IMPLANT
GLOVE SURG SS PI 7.0 STRL IVOR (GLOVE) ×2 IMPLANT
GOWN STRL REUS W/ TWL LRG LVL3 (GOWN DISPOSABLE) ×1 IMPLANT
GOWN STRL REUS W/ TWL XL LVL3 (GOWN DISPOSABLE) ×1 IMPLANT
GOWN STRL REUS W/TWL LRG LVL3 (GOWN DISPOSABLE) ×3
GOWN STRL REUS W/TWL XL LVL3 (GOWN DISPOSABLE) ×3
KIT BASIN OR (CUSTOM PROCEDURE TRAY) ×3 IMPLANT
KIT PLEURX DRAIN CATH 1000ML (MISCELLANEOUS) ×3 IMPLANT
KIT PLEURX DRAIN CATH 15.5FR (DRAIN) ×3 IMPLANT
KIT ROOM TURNOVER OR (KITS) ×3 IMPLANT
NS IRRIG 1000ML POUR BTL (IV SOLUTION) ×3 IMPLANT
PACK GENERAL/GYN (CUSTOM PROCEDURE TRAY) ×3 IMPLANT
PAD ARMBOARD 7.5X6 YLW CONV (MISCELLANEOUS) ×6 IMPLANT
SET DRAINAGE LINE (MISCELLANEOUS) IMPLANT
SUT ETHILON 3 0 PS 1 (SUTURE) ×3 IMPLANT
SUT VIC AB 3-0 X1 27 (SUTURE) ×3 IMPLANT
TOWEL OR 17X24 6PK STRL BLUE (TOWEL DISPOSABLE) ×3 IMPLANT
TOWEL OR 17X26 10 PK STRL BLUE (TOWEL DISPOSABLE) ×3 IMPLANT
VALVE REPLACEMENT CAP (MISCELLANEOUS) IMPLANT
WATER STERILE IRR 1000ML POUR (IV SOLUTION) ×3 IMPLANT

## 2015-01-02 NOTE — Transfer of Care (Signed)
Immediate Anesthesia Transfer of Care Note  Patient: [place]  Procedure(s) Performed: Procedure(s): INSERTION PLEURAL DRAINAGE CATHETER RIGHT CHEST (Right)  Patient Location: PACU  Anesthesia Type:MAC  Level of Consciousness: awake, alert , oriented and patient cooperative  Airway & Oxygen Therapy: Patient Spontanous Breathing and Patient connected to face mask oxygen  Post-op Assessment: Report given to RN, Post -op Vital signs reviewed and stable and Patient moving all extremities  Post vital signs: Reviewed and stable  Last Vitals:  Filed Vitals:   01/02/15 0611  BP: 130/41  Pulse: 97  Temp: 36.3 C  Resp: 18    Complications: No apparent anesthesia complications

## 2015-01-02 NOTE — Discharge Instructions (Signed)
May resume normal activity.  May shower  Home health nurse will start daily drainage tomorrow 01/03/2015 and will change dressing daily.

## 2015-01-02 NOTE — Anesthesia Preprocedure Evaluation (Signed)
Anesthesia Evaluation  Patient identified by MRN, date of birth, ID band Patient awake    Reviewed: Allergy & Precautions, NPO status , Patient's Chart, lab work & pertinent test results  Airway Mallampati: II   Neck ROM: full    Dental   Pulmonary shortness of breath,  Lung CA.  Right pleural effusion.   + decreased breath sounds      Cardiovascular hypertension, Rhythm:regular Rate:Normal     Neuro/Psych Anxiety Depression  Neuromuscular disease    GI/Hepatic GERD-  ,  Endo/Other  diabetes, Type 2Hypothyroidism obese  Renal/GU      Musculoskeletal   Abdominal   Peds  Hematology   Anesthesia Other Findings   Reproductive/Obstetrics H/o breast CA                             Anesthesia Physical Anesthesia Plan  ASA: III  Anesthesia Plan: MAC   Post-op Pain Management:    Induction: Intravenous  Airway Management Planned: Simple Face Mask  Additional Equipment:   Intra-op Plan:   Post-operative Plan:   Informed Consent: I have reviewed the patients History and Physical, chart, labs and discussed the procedure including the risks, benefits and alternatives for the proposed anesthesia with the patient or authorized representative who has indicated his/her understanding and acceptance.     Plan Discussed with: CRNA, Anesthesiologist and Surgeon  Anesthesia Plan Comments:         Anesthesia Quick Evaluation

## 2015-01-02 NOTE — Brief Op Note (Signed)
01/02/2015  8:30 AM  PATIENT:  [place]  59 y.o. female  PRE-OPERATIVE DIAGNOSIS:   MALIGNANT RIGHT PLEURAL EFFUSION  POST-OPERATIVE DIAGNOSIS:  MALIGNANT RIGHT PLEURAL EFFUSION  PROCEDURE:  Procedure(s): INSERTION PLEURAL DRAINAGE CATHETER RIGHT CHEST (Right)  SURGEON:  Surgeon(s) and Role:    * Gaye Pollack, MD - Primary  PHYSICIAN ASSISTANT: none  ASSISTANTS: none   ANESTHESIA:   local and MAC  EBL:     BLOOD ADMINISTERED:none  DRAINS: none   LOCAL MEDICATIONS USED:  LIDOCAINE   SPECIMEN:  No Specimen  DISPOSITION OF SPECIMEN:  N/A  COUNTS:  YES  TOURNIQUET:  * No tourniquets in log *  DICTATION: .Note written in EPIC  PLAN OF CARE: Discharge to home after PACU  PATIENT DISPOSITION:  PACU - hemodynamically stable.   Delay start of Pharmacological VTE agent (>24hrs) due to surgical blood loss or risk of bleeding: not applicable

## 2015-01-02 NOTE — Interval H&P Note (Signed)
History and Physical Interval Note:  01/02/2015 7:08 AM  [place]  has presented today for surgery, with the diagnosis of MALIGNANT RIGHT PLEURAL EFFUSION  The various methods of treatment have been discussed with the patient and family. After consideration of risks, benefits and other options for treatment, the patient has consented to  Procedure(s): INSERTION PLEURAL DRAINAGE CATHETER (Right) as a surgical intervention .  The patient's history has been reviewed, patient examined, no change in status, stable for surgery.  I have reviewed the patient's chart and labs.  Questions were answered to the patient's satisfaction.     Gaye Pollack

## 2015-01-02 NOTE — OR Nursing (Signed)
1250 mls fluid removed from right chest.TLC

## 2015-01-02 NOTE — Telephone Encounter (Signed)
Pt needs new rx diltiazem 120 mg #90 call into [facility]. This med was originally prescribed while pt was in hospital.. Pt was dx as dm in hospital. Pt needs free meter

## 2015-01-02 NOTE — H&P (Signed)
[address]       [place] [address]             [phone]      Cardiothoracic Surgery History and Physical    PCP is KIM, Nickola Major., DO Referring Provider is Nicholas Lose, MD  Chief Complaint  Patient presents with  . Malignant Pleural Effusion        HPI:  The patient is a 59 year old woman with a history of stage III, triple negative, invasive ductal carcinoma of the left breast with bilateral lung nodules that are biopsy proven to be triple negative metastatic carcinoma and a recurrent malignant right pleural effusion. She has had a right thoracentesis three times on 4/15, 4/30, and 12/14/2014 removing 1.7 L of fluid most recently. She says she felt better after the first two procedures for a short time but then had recurrent shortness of breath and reaccumulation of the fluid. Since her most recent thoracentesis her breathing has remained improved. She thinks it may be because she has received more chemotherapy. I saw her on 12/21/2014 and she had some recurrent pleural effusion but it was still small and I felt that we should repeat her CXR in a week. She has not noticed any change in her breathing but CXR now shows an increasing right pleural effusion.  Past Medical History  Diagnosis Date  . ALLERGIC RHINITIS   . GERD (gastroesophageal reflux disease)   . Hypertension   . Hypothyroidism   . Hx of colonic polyps   . Wears glasses   . Breast cancer 9, age 63    right  . Breast cancer 09/30/12    left, ER/PR -, Her 2 -  . Radiation 09/15/13-11/01/13    Left chest wall/PAB/scar/supraclavicular fossa  . SYNCOPE 05/15/2010    Qualifier: Diagnosis of By: Arnoldo Morale MD, [place], WITH RADICULOPATHY 10/01/2007    Qualifier: Diagnosis of By: Arnoldo Morale MD, [place] 05/31/2007    Qualifier: Diagnosis of By: Arnoldo Morale MD, [place], HX OF  05/31/2007    Qualifier: Diagnosis of By: Arnoldo Morale MD, Balinda Quails   . Lymph edema L arm from breast ca tx 12/22/2013  . Diabetes mellitus without complication     Past Surgical History  Procedure Laterality Date  . Tonsillectomy    . Caesarean section      x 1  . Mastectomy  04/1987    right side  . Abdominal hysterectomy  06/1988    fibroids  . Portacath placement Right 10/28/2012    Procedure: PORT PLACEMENT; Surgeon: Joyice Faster. Cornett, MD; Location: [facility]; Service: General; Laterality: Right; Right Subclavian Vein  . Mastectomy modified radical Left 07/19/2013    Procedure: MASTECTOMY MODIFIED RADICAL; Surgeon: Marcello Moores A. Cornett, MD; Location: [facility]; Service: General; Laterality: Left;  . Port-a-cath removal Right 07/19/2013    Procedure: REMOVAL PORT-A-CATH; Surgeon: Joyice Faster. Cornett, MD; Location: [facility]; Service: General; Laterality: Right;  . Breast surgery Left 07/19/13    MRM  . Cesarean section      Family History  Problem Relation Age of Onset  . Colon cancer Mother 64    family hx of colon ca 1st degree relative <60  . Hypertension Mother   . Coronary artery disease Father     family hx of CAD female 1st degree relative <50  . Stomach  cancer Neg Hx   . Rectal cancer Neg Hx   . Esophageal cancer Neg Hx   . Breast cancer Maternal Grandmother 80  . Diabetes Paternal Grandmother   . Breast cancer Cousin 25    maternal cousin  . Coronary artery disease Maternal Grandfather   . Breast cancer Sister     paternal half sister; died in her 50s    Social History History  Substance Use Topics  . Smoking status: Never Smoker   . Smokeless tobacco: Never Used  . Alcohol Use: No    Current Outpatient Prescriptions  Medication Sig Dispense Refill  .  cetirizine (ZYRTEC) 10 MG tablet Take 10 mg by mouth daily.    Christiane Ha Oil (CHIA SEED OIL EXTRACT PO) Take 1 each by mouth 4 (four) times a week.    . cholecalciferol (VITAMIN D) 1000 UNITS tablet Take 1,000 Units by mouth every morning.     . diltiazem (CARDIZEM CD) 120 MG 24 hr capsule Take 1 capsule (120 mg total) by mouth every morning. 30 capsule 0  . esomeprazole (NEXIUM) 20 MG capsule Take 20 mg by mouth daily at 12 noon.    . Flaxseed, Linseed, (FLAX SEEDS PO) Take 1 each by mouth 4 (four) times a week.    . fluticasone (FLONASE) 50 MCG/ACT nasal spray Place 2 sprays into both nostrils daily as needed for allergies or rhinitis.    Marland Kitchen KLOR-CON M20 20 MEQ tablet TAKE 1 TABLET (20 MEQ TOTAL) BY MOUTH 2 (TWO) TIMES DAILY. 60 tablet 0  . latanoprost (XALATAN) 0.005 % ophthalmic solution Place 1 drop into both eyes at bedtime.     Marland Kitchen levothyroxine (SYNTHROID, LEVOTHROID) 50 MCG tablet Take 1 tablet (50 mcg total) by mouth daily. 90 tablet 3  . lidocaine-prilocaine (EMLA) cream Apply to affected area once 30 g 3  . LORazepam (ATIVAN) 0.5 MG tablet Take 1 tablet (0.5 mg total) by mouth every 6 (six) hours as needed (Nausea or vomiting). 30 tablet 0  . metFORMIN (GLUCOPHAGE) 500 MG tablet Take 1000mg  (2 tabs) in the morning and 500mg  (1 tab) in the evening with meals 135 tablet 3  . naproxen sodium (ANAPROX) 220 MG tablet Take 440 mg by mouth daily as needed (pain).    . NON FORMULARY Apply 1 application topically daily as needed (Applies to feet for athlete's foot.). Fungifoam    . ondansetron (ZOFRAN) 8 MG tablet Take 1 tablet (8 mg total) by mouth 2 (two) times daily. Start the day after chemo for 2 days. Then take as needed for nausea or vomiting. 30 tablet 1  . oxyCODONE (OXY IR/ROXICODONE) 5 MG immediate release tablet Take 1 tablet (5 mg total) by mouth every 4 (four) hours as needed for moderate pain. 30 tablet 0    . prochlorperazine (COMPAZINE) 10 MG tablet Take 1 tablet (10 mg total) by mouth every 6 (six) hours as needed (Nausea or vomiting). 30 tablet 1  . Tetrahydrozoline HCl (VISINE OP) Apply 1-2 drops to eye daily as needed (itchy eyes.).     No current facility-administered medications for this visit.    Allergies  Allergen Reactions  . Sulfa Antibiotics Hives, Itching and Swelling  . Sulfonamide Derivatives Hives, Itching and Swelling  . Tomato     Review of Systems  Constitutional: Positive for activity change, appetite change, fatigue and unexpected weight change.   Weight loss  HENT: Negative.  Eyes: Negative.  Respiratory: Positive for cough and shortness of breath.  Using oxygen at home  Cardiovascular: Negative for chest pain, palpitations and leg swelling.  Gastrointestinal:   Hiatal hernia and reflux  Endocrine: Negative.  Genitourinary: Negative.  Musculoskeletal: Positive for joint swelling.   Left hand  Skin: Negative.  Neurological:   Neuropathy in hands and feet  Hematological: Negative.  Psychiatric/Behavioral: The patient is nervous/anxious.    BP 118/72 mmHg  Pulse 113  Resp 20  Ht 5\' 4"  (1.626 m)  Wt 208 lb (94.348 kg)  BMI 35.69 kg/m2  SpO2 92% Physical Exam  Constitutional: She is oriented to person, place, and time. She appears well-developed and well-nourished. No distress.  HENT:  Head: Normocephalic and atraumatic.  Mouth/Throat: Oropharynx is clear and moist.  Eyes: EOM are normal.  Neck: Normal range of motion. Neck supple. No JVD present. No thyromegaly present.  Cardiovascular: Normal rate, regular rhythm and normal heart sounds.  No murmur heard. Pulmonary/Chest: Effort normal.  Decreased breath sounds over RLL  Abdominal: Soft. Bowel sounds are normal. She exhibits no distension and no mass. There is no tenderness.  Musculoskeletal: Normal range of motion. She exhibits  edema.  Left arm lymphedema since her surgery  Lymphadenopathy:   She has no cervical adenopathy.  Neurological: She is alert and oriented to person, place, and time.  Skin: Skin is warm and dry.  Psychiatric: She has a normal mood and affect.     Diagnostic Tests:  CLINICAL DATA: Subsequent treatment strategy for metastatic breast cancer. Restaging examination.  EXAM: NUCLEAR MEDICINE PET SKULL BASE TO THIGH  TECHNIQUE: 10.5 mCi F-18 FDG was injected intravenously. Full-ring PET imaging was performed from the skull base to thigh after the radiotracer. CT data was obtained and used for attenuation correction and anatomic localization.  FASTING BLOOD GLUCOSE: Value: 122 mg/dl  COMPARISON: CT of the chest, abdomen and pelvis 10/30/2014. PET-CT 10/27/2012.  FINDINGS: NECK  No hypermetabolic lymph nodes in the neck.  CHEST  Numerous hypermetabolic nodules and masses are noted scattered throughout the lungs bilaterally, compatible with widespread metastatic disease. The largest conglomerate of hypermetabolism is in the right middle lobe with where there is a hypermetabolic (SUVmax = 00.9) mass estimated to measure approximately 3.5 x 4.6 cm (image 85 of series 4). Multiple other smaller pulmonary nodules are also noted, also or hypermetabolic. Large right-sided pleural effusion with extensive right pleural hypermetabolism (SUVmax = 3.8-4.3), compatible with a malignant pleural effusion. This is associated with extensive passive atelectasis in the right lung, particularly in the right lower lobe. Extensive right hilar and mediastinal hypermetabolic lymphadenopathy, difficult to measure on today's noncontrast CT examination. Postoperative changes of left-sided modified radical mastectomy and left axillary nodal dissection are noted, with what appears to be a chronic postoperative fluid collection in the left chest wall, similar to the prior examination,  presumably a large seroma. No significant hypermetabolism is noted to suggest local recurrence of disease. Right internal jugular single-lumen porta cath with tip terminating in the right atrium.  ABDOMEN/PELVIS  No abnormal hypermetabolic activity within the liver, pancreas, adrenal glands, or spleen. No hypermetabolic lymph nodes in the abdomen or pelvis. 3 cm partially calcified gallstone in the gallbladder. No current findings to suggest an acute cholecystitis at this time. No significant volume of ascites. No pneumoperitoneum. No pathologic distention of small bowel or colon. Numerous colonic diverticulae are noted, without surrounding inflammatory changes to suggest an acute diverticulitis at this time. Status post hysterectomy. Ovaries are unremarkable in appearance.  SKELETON  No focal hypermetabolic activity to suggest skeletal  metastasis.  IMPRESSION: 1. Widespread metastatic disease to the thorax, as evidenced by numerous pulmonary nodules and masses, a malignant right pleural effusion, and right hilar and mediastinal malignant lymphadenopathy, as detailed above. 2. No definite signs of other metastatic disease in the neck, abdomen or pelvis. 3. No definite skeletal metastases. 4. Cholelithiasis without evidence of acute cholecystitis. 5. Colonic diverticulosis without findings to suggest acute diverticulitis at this time. 6. Additional incidental findings, as above.   Electronically Signed  By: Vinnie Langton M.D.  On: 11/27/2014 09:46  Impression:  She has a recurrent malignant right pleural effusion from metastatic breast cancer. She has had thoracentesis x 3 over a short period of time and I think it is likely that this effusion will reaccumulate. I plan to insert a PleurX catheter and she may then need a talc pleurodesis. I discussed the procedure with her including alternatives, benefits, and risks including but not limited to bleeding, infection,  injury to the lung and recurrent pleural effusion. She understands and agrees to proceed  Plan:  I will insert a right PleurX catheter on Tuesday 01/02/2015. I will reevaluate her at one week and if the catheter is still draining and the lung is completely expanded I will plan talc pleurodesis.   Gaye Pollack, MD [facility] [phone]

## 2015-01-02 NOTE — Anesthesia Postprocedure Evaluation (Signed)
  Anesthesia Post-op Note  Patient: Christine [name]  Procedure(s) Performed: Procedure(s): INSERTION PLEURAL DRAINAGE CATHETER RIGHT CHEST (Right)  Patient Location: PACU  Anesthesia Type: MAC  Level of Consciousness: awake and alert   Airway and Oxygen Therapy: Patient Spontanous Breathing  Post-op Pain: none  Post-op Assessment: Post-op Vital signs reviewed, Patient's Cardiovascular Status Stable and Respiratory Function Stable  Post-op Vital Signs: Reviewed  Filed Vitals:   01/02/15 0946  BP: 120/60  Pulse: 80  Temp:   Resp:     Complications: No apparent anesthesia complications

## 2015-01-03 ENCOUNTER — Encounter (HOSPITAL_COMMUNITY): Payer: Self-pay | Admitting: Surgery

## 2015-01-03 ENCOUNTER — Other Ambulatory Visit: Payer: Self-pay | Admitting: Family Medicine

## 2015-01-03 DIAGNOSIS — J910 Malignant pleural effusion: Secondary | ICD-10-CM | POA: Diagnosis not present

## 2015-01-03 DIAGNOSIS — C50412 Malignant neoplasm of upper-outer quadrant of left female breast: Secondary | ICD-10-CM | POA: Diagnosis not present

## 2015-01-03 MED ORDER — DILTIAZEM HCL ER COATED BEADS 120 MG PO CP24: 120.0000 mg | ORAL_CAPSULE | Freq: Every morning | ORAL | Status: DC

## 2015-01-03 MED ORDER — ONETOUCH VERIO FLEX SYSTEM W/DEVICE KIT: 1.0000 | PACK | Freq: Once | Status: DC

## 2015-01-03 MED ORDER — GLUCOSE BLOOD VI STRP: ORAL_STRIP | Status: DC

## 2015-01-03 MED ORDER — ONETOUCH ULTRASOFT LANCETS MISC: Status: DC

## 2015-01-03 NOTE — Op Note (Addendum)
CARDIOTHORACIC SURGERY OPERATIVE NOTE   01/02/2015 [place] [id number]  Surgeon: Gaye Pollack, MD   First Assistant: none   Preoperative Diagnosis: Recurrent malignant right pleural effusion  Postoperative Diagnosis: same   Procedure:   1.Insertion of Right PleurX catheter   Anesthesia: MAC with local   Clinical History/Surgical Indication:   The patient is a 59 year old woman with a history of stage III, triple negative, invasive ductal carcinoma of the left breast with bilateral lung nodules that are biopsy proven to be triple negative metastatic carcinoma and a recurrent malignant right pleural effusion. She has had a right thoracentesis three times on 4/15, 4/30, and 12/14/2014 removing 1.7 L of fluid most recently. She says she felt better after the first two procedures for a short time but then had recurrent shortness of breath and reaccumulation of the fluid. Since her most recent thoracentesis her breathing has remained improved. She thinks it may be because she has received more chemotherapy. I saw her on 12/21/2014 and she had some recurrent pleural effusion but it was still small and I felt that we should repeat her CXR in a week. She has not noticed any change in her breathing but CXR now shows an increasing right pleural effusion.  She has a recurrent malignant right pleural effusion from metastatic breast cancer. She has had thoracentesis x 3 over a short period of time and I think it is likely that this effusion will reaccumulate. I plan to insert a PleurX catheter and she may then need a talc pleurodesis. I discussed the procedure with her including alternatives, benefits, and risks including but not limited to bleeding, infection, injury to the lung and recurrent pleural effusion. She understands and agrees to proceed.   Preparation:  The patient was seen in the preoperative holding area and the correct patient, correct operation, correct operative sidewere  confirmed with the patient after reviewing the medical record and CT scan. The right side of the chest was signed by me. The consent was signed by me. Preoperative antibiotics were given. The patient was taken back to the operating room and positioned supine on the operating room table. After intravenous sedation by anesthesia the chest was prepped with betadine soap and solution and draped in the usual sterile manner. A surgical time-out was taken and the correct patient,operative side, and operative procedure were confirmed with the nursing and anesthesia staff.   Operative Procedure:   The chest wall entry site was located on the right lateral chest approximately in the 8th ICS. 1% lidocaine was infiltrated in the skin and subcutaneous tissue down to the pleural space. Serous fluid was encountered. A small incision was made and the right pleural space was entered with a needle catheter. The needle was removed and the guidewire inserted into the pleural space. Its position was checked with fluoroscopy. The skin exit site was chosen on the anterior chest wall just above the costal margin and lidocaine was infiltrated here and along a subcutaneous tunnel over to the chest wall entry site. A small incision was made at the skin exit site and the Pleurx catheter was tunneled from the exit site over to the chest wall entry site and positioned with the cuff just inside the exit site. An introducer and sheath were inserted into the pleural space over the guide wire and the introducer and wire removed. The catheter was inserted into the pleural space and the sheath removed. The catheter was connected to vacuum bottle suction and  1.2 liters of serous pleural fluid was removed. The catheter was fixed to the skin at the exit site with a nylon suture and the other incision was closed with a 3-0 vicryl subcuticular suture and Dermabond. The catheter was capped and a dressing applied over the exit site. The patient tolerated  the procedure well with mild coughing. The sponge, needle and instrument counts were correct according to the nurses and the patient was transported to the PACU in stable and satisfactory condition.

## 2015-01-03 NOTE — Telephone Encounter (Signed)
Medication sent. Can you help her with getting meter? Thanks.

## 2015-01-03 NOTE — Telephone Encounter (Signed)
I called the pt and she is aware a free OneTouch Verio Flex glucometer kit was left at the front desk for her to pick up.  She is aware the test strips and lancets were sent to her pharmacy.

## 2015-01-04 ENCOUNTER — Other Ambulatory Visit: Payer: Self-pay | Admitting: *Deleted

## 2015-01-04 ENCOUNTER — Ambulatory Visit (HOSPITAL_BASED_OUTPATIENT_CLINIC_OR_DEPARTMENT_OTHER): Payer: BLUE CROSS/BLUE SHIELD

## 2015-01-04 VITALS — BP 121/64 | HR 105 | Temp 98.9°F | Resp 18

## 2015-01-04 DIAGNOSIS — C50412 Malignant neoplasm of upper-outer quadrant of left female breast: Secondary | ICD-10-CM | POA: Diagnosis not present

## 2015-01-04 DIAGNOSIS — D701 Agranulocytosis secondary to cancer chemotherapy: Secondary | ICD-10-CM

## 2015-01-04 DIAGNOSIS — C778 Secondary and unspecified malignant neoplasm of lymph nodes of multiple regions: Secondary | ICD-10-CM | POA: Diagnosis not present

## 2015-01-04 MED ORDER — DILTIAZEM HCL ER COATED BEADS 120 MG PO CP24: 120.0000 mg | ORAL_CAPSULE | Freq: Every morning | ORAL | Status: DC

## 2015-01-04 MED ORDER — SODIUM CHLORIDE 0.9 % IV SOLN
Freq: Once | INTRAVENOUS | Status: AC
  Administered 2015-01-04: 11:00:00 via INTRAVENOUS

## 2015-01-04 MED ORDER — SODIUM CHLORIDE 0.9 % IJ SOLN
10.0000 mL | INTRAMUSCULAR | Status: DC | PRN
  Administered 2015-01-04: 10 mL
  Filled 2015-01-04: qty 10

## 2015-01-04 MED ORDER — SODIUM CHLORIDE 0.9 % IV SOLN
Freq: Once | INTRAVENOUS | Status: AC
  Administered 2015-01-04: 11:00:00 via INTRAVENOUS
  Filled 2015-01-04: qty 4

## 2015-01-04 MED ORDER — PEGFILGRASTIM 6 MG/0.6ML ~~LOC~~ PSKT
6.0000 mg | PREFILLED_SYRINGE | Freq: Once | SUBCUTANEOUS | Status: AC
  Administered 2015-01-04: 6 mg via SUBCUTANEOUS
  Filled 2015-01-04: qty 0.6

## 2015-01-04 MED ORDER — SODIUM CHLORIDE 0.9 % IV SOLN
1.4200 mg/m2 | Freq: Once | INTRAVENOUS | Status: AC
  Administered 2015-01-04: 3 mg via INTRAVENOUS
  Filled 2015-01-04: qty 6

## 2015-01-04 MED ORDER — HEPARIN SOD (PORK) LOCK FLUSH 100 UNIT/ML IV SOLN
500.0000 [IU] | Freq: Once | INTRAVENOUS | Status: AC | PRN
  Administered 2015-01-04: 500 [IU]
  Filled 2015-01-04: qty 5

## 2015-01-04 NOTE — Progress Notes (Signed)
Okay to use labs values on 12/27/14 to treat today, per Dr. Lindi Adie.

## 2015-01-04 NOTE — Telephone Encounter (Signed)
Rx done. 

## 2015-01-04 NOTE — Patient Instructions (Signed)
[facility] Discharge Instructions for Patients Receiving Chemotherapy  Today you received the following chemotherapy agents [place].  To help prevent nausea and vomiting after your treatment, we encourage you to take your nausea medication.   If you develop nausea and vomiting that is not controlled by your nausea medication, call the clinic.   BELOW ARE SYMPTOMS THAT SHOULD BE REPORTED IMMEDIATELY:  *FEVER GREATER THAN 100.5 F  *CHILLS WITH OR WITHOUT FEVER  NAUSEA AND VOMITING THAT IS NOT CONTROLLED WITH YOUR NAUSEA MEDICATION  *UNUSUAL SHORTNESS OF BREATH  *UNUSUAL BRUISING OR BLEEDING  TENDERNESS IN MOUTH AND THROAT WITH OR WITHOUT PRESENCE OF ULCERS  *URINARY PROBLEMS  *BOWEL PROBLEMS  UNUSUAL RASH Items with * indicate a potential emergency and should be followed up as soon as possible.  Feel free to call the clinic you have any questions or concerns. The clinic phone number is [phone].  Please show the CHEMO ALERT CARD at check-in to the Emergency Department and triage nurse.   

## 2015-01-10 ENCOUNTER — Other Ambulatory Visit: Payer: Self-pay | Admitting: *Deleted

## 2015-01-10 ENCOUNTER — Other Ambulatory Visit: Payer: Self-pay | Admitting: Surgery

## 2015-01-10 ENCOUNTER — Other Ambulatory Visit: Payer: Self-pay | Admitting: Hematology and Oncology

## 2015-01-10 ENCOUNTER — Ambulatory Visit: Payer: BLUE CROSS/BLUE SHIELD | Admitting: Surgery

## 2015-01-10 ENCOUNTER — Telehealth: Payer: Self-pay | Admitting: *Deleted

## 2015-01-10 DIAGNOSIS — J90 Pleural effusion, not elsewhere classified: Secondary | ICD-10-CM

## 2015-01-10 NOTE — Telephone Encounter (Signed)
Patient will come in tomorrow morning for labs. POF sent to scheduler

## 2015-01-11 ENCOUNTER — Telehealth: Payer: Self-pay | Admitting: *Deleted

## 2015-01-11 ENCOUNTER — Other Ambulatory Visit (HOSPITAL_BASED_OUTPATIENT_CLINIC_OR_DEPARTMENT_OTHER): Payer: BLUE CROSS/BLUE SHIELD

## 2015-01-11 DIAGNOSIS — D701 Agranulocytosis secondary to cancer chemotherapy: Secondary | ICD-10-CM | POA: Diagnosis not present

## 2015-01-11 DIAGNOSIS — D6959 Other secondary thrombocytopenia: Secondary | ICD-10-CM | POA: Diagnosis not present

## 2015-01-11 DIAGNOSIS — C50912 Malignant neoplasm of unspecified site of left female breast: Principal | ICD-10-CM

## 2015-01-11 DIAGNOSIS — D6481 Anemia due to antineoplastic chemotherapy: Secondary | ICD-10-CM | POA: Diagnosis not present

## 2015-01-11 DIAGNOSIS — C50412 Malignant neoplasm of upper-outer quadrant of left female breast: Secondary | ICD-10-CM

## 2015-01-11 DIAGNOSIS — C50911 Malignant neoplasm of unspecified site of right female breast: Secondary | ICD-10-CM

## 2015-01-11 LAB — CBC WITH DIFFERENTIAL/PLATELET
BASO%: 0.4 % (ref 0.0–2.0)
BASOS ABS: 0 10*3/uL (ref 0.0–0.1)
EOS%: 0 % (ref 0.0–7.0)
Eosinophils Absolute: 0 10*3/uL (ref 0.0–0.5)
HCT: 32.2 % — ABNORMAL LOW (ref 34.8–46.6)
HGB: 10.6 g/dL — ABNORMAL LOW (ref 11.6–15.9)
LYMPH%: 14.1 % (ref 14.0–49.7)
MCH: 30.3 pg (ref 25.1–34.0)
MCHC: 33 g/dL (ref 31.5–36.0)
MCV: 91.8 fL (ref 79.5–101.0)
MONO#: 1.2 10*3/uL — ABNORMAL HIGH (ref 0.1–0.9)
MONO%: 12.9 % (ref 0.0–14.0)
NEUT#: 6.7 10*3/uL — ABNORMAL HIGH (ref 1.5–6.5)
NEUT%: 72.6 % (ref 38.4–76.8)
Platelets: 196 10*3/uL (ref 145–400)
RBC: 3.5 10*6/uL — AB (ref 3.70–5.45)
RDW: 17.1 % — AB (ref 11.2–14.5)
WBC: 9.3 10*3/uL (ref 3.9–10.3)
lymph#: 1.3 10*3/uL (ref 0.9–3.3)

## 2015-01-11 LAB — COMPREHENSIVE METABOLIC PANEL (CC13)
ALBUMIN: 3.6 g/dL (ref 3.5–5.0)
ALT: 21 U/L (ref 0–55)
ANION GAP: 11 meq/L (ref 3–11)
AST: 28 U/L (ref 5–34)
Alkaline Phosphatase: 67 U/L (ref 40–150)
BILIRUBIN TOTAL: 0.93 mg/dL (ref 0.20–1.20)
BUN: 7.4 mg/dL (ref 7.0–26.0)
CALCIUM: 7.9 mg/dL — AB (ref 8.4–10.4)
CHLORIDE: 102 meq/L (ref 98–109)
CO2: 32 meq/L — AB (ref 22–29)
CREATININE: 0.8 mg/dL (ref 0.6–1.1)
EGFR: 88 mL/min/{1.73_m2} — ABNORMAL LOW (ref >90)
Glucose: 192 mg/dl — ABNORMAL HIGH (ref 70–140)
Potassium: 3 mEq/L — CL (ref 3.5–5.1)
SODIUM: 145 meq/L (ref 136–145)
Total Protein: 6.2 g/dL — ABNORMAL LOW (ref 6.4–8.3)

## 2015-01-11 NOTE — Telephone Encounter (Signed)
Patient had labs drawn today due to feeling very tired. Advised patient that CBC was within normal ranges, advised that chemistry was not back yet but would let patient know if anything was abnormal. She verbalized understanding.

## 2015-01-11 NOTE — Telephone Encounter (Signed)
Advised patient that her potassium level was low at 3.0. Patient stated that she had stopped taking her Klor-con when they stopped her blood pressure medication. Advised patient to take her Klor-con 3 times a day. She verbalized understanding. Patient has lab appt next week along with f/u.

## 2015-01-12 ENCOUNTER — Encounter: Payer: Self-pay | Admitting: Surgery

## 2015-01-12 ENCOUNTER — Ambulatory Visit (INDEPENDENT_AMBULATORY_CARE_PROVIDER_SITE_OTHER): Payer: BLUE CROSS/BLUE SHIELD | Admitting: Surgery

## 2015-01-12 ENCOUNTER — Ambulatory Visit
Admission: RE | Admit: 2015-01-12 | Discharge: 2015-01-12 | Disposition: A | Discharge status: Home / Self Care | Payer: BLUE CROSS/BLUE SHIELD | Source: Ambulatory Visit | Attending: Surgery | Admitting: Surgery

## 2015-01-12 VITALS — BP 132/80 | HR 107 | Resp 16 | Ht 64.0 in | Wt 206.0 lb

## 2015-01-12 DIAGNOSIS — J90 Pleural effusion, not elsewhere classified: Secondary | ICD-10-CM

## 2015-01-12 DIAGNOSIS — J910 Malignant pleural effusion: Secondary | ICD-10-CM | POA: Diagnosis not present

## 2015-01-12 DIAGNOSIS — Z853 Personal history of malignant neoplasm of breast: Secondary | ICD-10-CM | POA: Diagnosis not present

## 2015-01-13 ENCOUNTER — Other Ambulatory Visit: Payer: Self-pay | Admitting: Family Medicine

## 2015-01-15 ENCOUNTER — Encounter: Payer: Self-pay | Admitting: Surgery

## 2015-01-15 ENCOUNTER — Other Ambulatory Visit: Payer: Self-pay | Admitting: *Deleted

## 2015-01-15 DIAGNOSIS — J90 Pleural effusion, not elsewhere classified: Secondary | ICD-10-CM

## 2015-01-15 MED ORDER — TALC 5 G PL SUSR: 3.0000 g | Freq: Once | INTRAVENOUS | Status: DC

## 2015-01-15 NOTE — Progress Notes (Signed)
HPI:  She returns for follow up after insertion of a right PleurX catheter on 01/03/2015 for recurrent malignant pleural effusion. Her breathing has been good. The catheter is draining 200 cc every 3-4 days.   Current Outpatient Prescriptions  Medication Sig Dispense Refill  . Blood Glucose Monitoring Suppl (ONETOUCH VERIO FLEX SYSTEM) W/DEVICE KIT 1 Device by Does not apply route once. 1 kit 0  . cetirizine (ZYRTEC) 10 MG tablet Take 10 mg by mouth daily.    . cholecalciferol (VITAMIN D) 1000 UNITS tablet Take 1,000 Units by mouth every morning.     . diltiazem (CARDIZEM CD) 120 MG 24 hr capsule Take 1 capsule (120 mg total) by mouth every morning. 90 capsule 1  . esomeprazole (NEXIUM) 20 MG capsule Take 20 mg by mouth daily.     . fluticasone (FLONASE) 50 MCG/ACT nasal spray Place 2 sprays into both nostrils daily as needed for allergies or rhinitis.    Marland Kitchen glucose blood (ONETOUCH VERIO) test strip Use as instructed to check blood sugar once a day 100 each 1  . KLOR-CON M20 20 MEQ tablet TAKE 1 TABLET (20 MEQ TOTAL) BY MOUTH 2 (TWO) TIMES DAILY. 60 tablet 0  . Lancets (ONETOUCH ULTRASOFT) lancets Use as instructed 100 each 1  . latanoprost (XALATAN) 0.005 % ophthalmic solution Place 1 drop into both eyes at bedtime.     Marland Kitchen levothyroxine (SYNTHROID, LEVOTHROID) 50 MCG tablet Take 1 tablet (50 mcg total) by mouth daily. 90 tablet 3  . lidocaine-prilocaine (EMLA) cream Apply to affected area once (Patient taking differently: Apply 1 application topically as needed (for chemo). Apply to affected area once) 30 g 3  . LORazepam (ATIVAN) 0.5 MG tablet Take 1 tablet (0.5 mg total) by mouth every 6 (six) hours as needed (Nausea or vomiting). (Patient taking differently: Take 0.5 mg by mouth as needed for sleep (Nausea or vomiting for chemo). ) 30 tablet 0  . metFORMIN (GLUCOPHAGE) 500 MG tablet Take 1000mg  (2 tabs) in the morning and 500mg  (1 tab) in the evening with meals 135 tablet 3  . naproxen  sodium (ANAPROX) 220 MG tablet Take 440 mg by mouth daily as needed (pain).    . NON FORMULARY Apply 1 application topically as needed (FUNGIFOAM - Applies to feet for athlete's foot.).     Marland Kitchen ondansetron (ZOFRAN) 8 MG tablet Take 1 tablet (8 mg total) by mouth 2 (two) times daily. Start the day after chemo for 2 days. Then take as needed for nausea or vomiting. 30 tablet 1  . oxyCODONE (OXY IR/ROXICODONE) 5 MG immediate release tablet Take 1 tablet (5 mg total) by mouth every 4 (four) hours as needed for moderate pain. 30 tablet 0  . prochlorperazine (COMPAZINE) 10 MG tablet Take 1 tablet (10 mg total) by mouth every 6 (six) hours as needed (Nausea or vomiting). 30 tablet 1  . Tetrahydrozoline HCl (VISINE OP) Apply 1-2 drops to eye daily as needed (itchy eyes.).    Marland Kitchen losartan-hydrochlorothiazide (HYZAAR) 50-12.5 MG per tablet TAKE 1 TABLET BY MOUTH DAILY. 90 tablet 0   No current facility-administered medications for this visit.     Physical Exam: BP 132/80 mmHg  Pulse 107  Resp 16  Ht 5\' 4"  (1.626 m)  Wt 206 lb (93.441 kg)  BMI 35.34 kg/m2  SpO2 94% She looks well Lungs are clear The PleurX catheter site looks ok with minimal serous drainage.  Diagnostic Tests:  CLINICAL DATA: Pleural effusion.  EXAM:  CHEST 2 VIEW  COMPARISON: 01/02/2015  FINDINGS: Unchanged positioning of the tunneled pleural catheter on the right. 3 sideholes are external to the bony thorax. Pleural effusion is small volume, but greater than 01/02/2015. Right IJ porta catheter, tip in the deep right atrium. Postsurgical changes to both axilla. Pulmonary metastatic disease, with the largest deposit at the right base measuring 38 mm. Mild cardiomegaly is stable. Negative aortic and hilar contours. No pneumothorax.  IMPRESSION: Stable positioning of tunneled pleural catheter on the right. Three sideholes project outside of the bony thorax. Pleural fluid is greater than 01/02/2015, but small  volume.   Electronically Signed  By: Monte Fantasia M.D.  On: 01/12/2015 13:55  Impression:  The catheter is draining low volume every few days. Given the bulk of her right pleural disease I think talc pleurodesis is indicated to try to prevent recurrent effusion. I discussed my recommendation for talc pleurodesis, alternative of removing the catheter without talc, benefits and risks including systemic reaction to talc and recurrent effusion. She understands and agrees to proceed.  Plan:  We will schedule for talc pleurodesis and removal of the catheter at the same time.   Gaye Pollack, MD [facility] [phone]

## 2015-01-16 ENCOUNTER — Ambulatory Visit (HOSPITAL_COMMUNITY)
Admission: RE | Admit: 2015-01-16 | Discharge: 2015-01-16 | Disposition: A | Discharge status: Home / Self Care | Payer: BLUE CROSS/BLUE SHIELD | Source: Ambulatory Visit | Attending: Surgery | Admitting: Surgery

## 2015-01-16 ENCOUNTER — Encounter (HOSPITAL_COMMUNITY)
Admission: RE | Disposition: A | Discharge status: Home / Self Care | Payer: Self-pay | Source: Ambulatory Visit | Attending: Surgery

## 2015-01-16 DIAGNOSIS — J90 Pleural effusion, not elsewhere classified: Secondary | ICD-10-CM

## 2015-01-16 DIAGNOSIS — C7801 Secondary malignant neoplasm of right lung: Secondary | ICD-10-CM | POA: Diagnosis not present

## 2015-01-16 DIAGNOSIS — J910 Malignant pleural effusion: Secondary | ICD-10-CM | POA: Insufficient documentation

## 2015-01-16 DIAGNOSIS — C50912 Malignant neoplasm of unspecified site of left female breast: Secondary | ICD-10-CM | POA: Insufficient documentation

## 2015-01-16 DIAGNOSIS — C7802 Secondary malignant neoplasm of left lung: Secondary | ICD-10-CM | POA: Diagnosis not present

## 2015-01-16 HISTORY — PX: TALC PLEURODESIS: SHX2506

## 2015-01-16 HISTORY — PX: REMOVAL OF PLEURAL DRAINAGE CATHETER: SHX5080

## 2015-01-16 SURGERY — REMOVAL, CLOSED DRAINAGE CATHETER SYSTEM, PLEURAL
Anesthesia: Monitor Anesthesia Care | Laterality: Right

## 2015-01-16 MED ORDER — TALC 5 G PL SUSR
INTRAPLEURAL | Status: AC
  Filled 2015-01-16: qty 5

## 2015-01-16 MED ORDER — LIDOCAINE HCL (PF) 1 % IJ SOLN
INTRAMUSCULAR | Status: AC
  Filled 2015-01-16: qty 5

## 2015-01-16 MED ORDER — TALC 5 G PL SUSR
3.0000 g | Freq: Once | INTRAVENOUS | Status: DC
  Filled 2015-01-16 (×2): qty 3

## 2015-01-16 NOTE — Discharge Instructions (Signed)
D/c instructions given per Barnett Applebaum NP.

## 2015-01-16 NOTE — Brief Op Note (Signed)
      [address]       [place] [address]             [phone]     01/16/2015  2:44 PM  PATIENT:  Christine [name]  59 y.o. female  PRE-OPERATIVE DIAGNOSIS:  Recurrent malignant right pleural effusion  POST-OPERATIVE DIAGNOSIS:  Recurrent malignant right pleural effusion   PROCEDURE:  TALC PLEURODESIS RIGHT PLEURX CATHETER  REMOVAL OF RIGHT PLEURX CATHETER  PHYSICIAN ASSISTANT: Suzzanne Cloud, PA-C  ANESTHESIA:   local  SPECIMEN:  No Specimen  PATIENT CONDITION:  Stable to discharge home    FINDINGS:  2 g talc slurry instilled via right PleuRx catheter without difficulty.  Area prepped and draped in a sterile fashion and 3 ml 1% Xylocaine used to numb the site.  Right PleuRx removed with cuff intact.  3-0 silk suture used to close the incision. Pt tolerated the procedure well.

## 2015-01-17 ENCOUNTER — Other Ambulatory Visit (HOSPITAL_BASED_OUTPATIENT_CLINIC_OR_DEPARTMENT_OTHER): Payer: BLUE CROSS/BLUE SHIELD

## 2015-01-17 ENCOUNTER — Ambulatory Visit (HOSPITAL_BASED_OUTPATIENT_CLINIC_OR_DEPARTMENT_OTHER): Payer: BLUE CROSS/BLUE SHIELD

## 2015-01-17 ENCOUNTER — Ambulatory Visit (HOSPITAL_BASED_OUTPATIENT_CLINIC_OR_DEPARTMENT_OTHER): Payer: BLUE CROSS/BLUE SHIELD | Admitting: Oncology

## 2015-01-17 ENCOUNTER — Encounter: Payer: Self-pay | Admitting: Oncology

## 2015-01-17 ENCOUNTER — Telehealth: Payer: Self-pay | Admitting: Hematology and Oncology

## 2015-01-17 VITALS — BP 122/60 | HR 108 | Temp 97.4°F | Resp 18 | Ht 64.0 in | Wt 205.7 lb

## 2015-01-17 DIAGNOSIS — C50912 Malignant neoplasm of unspecified site of left female breast: Secondary | ICD-10-CM

## 2015-01-17 DIAGNOSIS — C50412 Malignant neoplasm of upper-outer quadrant of left female breast: Secondary | ICD-10-CM

## 2015-01-17 DIAGNOSIS — Z171 Estrogen receptor negative status [ER-]: Secondary | ICD-10-CM

## 2015-01-17 DIAGNOSIS — C7800 Secondary malignant neoplasm of unspecified lung: Secondary | ICD-10-CM

## 2015-01-17 DIAGNOSIS — D701 Agranulocytosis secondary to cancer chemotherapy: Secondary | ICD-10-CM

## 2015-01-17 DIAGNOSIS — J910 Malignant pleural effusion: Secondary | ICD-10-CM | POA: Diagnosis not present

## 2015-01-17 DIAGNOSIS — C778 Secondary and unspecified malignant neoplasm of lymph nodes of multiple regions: Secondary | ICD-10-CM

## 2015-01-17 DIAGNOSIS — C50911 Malignant neoplasm of unspecified site of right female breast: Secondary | ICD-10-CM

## 2015-01-17 DIAGNOSIS — D6959 Other secondary thrombocytopenia: Secondary | ICD-10-CM

## 2015-01-17 DIAGNOSIS — R0602 Shortness of breath: Secondary | ICD-10-CM

## 2015-01-17 LAB — COMPREHENSIVE METABOLIC PANEL (CC13)
ALK PHOS: 74 U/L (ref 40–150)
ALT: 19 U/L (ref 0–55)
ANION GAP: 16 meq/L — AB (ref 3–11)
AST: 26 U/L (ref 5–34)
Albumin: 3.3 g/dL — ABNORMAL LOW (ref 3.5–5.0)
BUN: 10.1 mg/dL (ref 7.0–26.0)
CO2: 24 mEq/L (ref 22–29)
CREATININE: 0.9 mg/dL (ref 0.6–1.1)
Calcium: 6.4 mg/dL — CL (ref 8.4–10.4)
Chloride: 101 mEq/L (ref 98–109)
EGFR: 85 mL/min/{1.73_m2} — ABNORMAL LOW (ref >90)
Glucose: 171 mg/dl — ABNORMAL HIGH (ref 70–140)
Potassium: 3.3 mEq/L — ABNORMAL LOW (ref 3.5–5.1)
Sodium: 142 mEq/L (ref 136–145)
Total Bilirubin: 1.47 mg/dL — ABNORMAL HIGH (ref 0.20–1.20)
Total Protein: 6 g/dL — ABNORMAL LOW (ref 6.4–8.3)

## 2015-01-17 LAB — CBC WITH DIFFERENTIAL/PLATELET
BASO%: 0.3 % (ref 0.0–2.0)
BASOS ABS: 0.1 10*3/uL (ref 0.0–0.1)
EOS%: 0 % (ref 0.0–7.0)
Eosinophils Absolute: 0 10*3/uL (ref 0.0–0.5)
HEMATOCRIT: 35.2 % (ref 34.8–46.6)
HEMOGLOBIN: 11.4 g/dL — AB (ref 11.6–15.9)
LYMPH%: 3.2 % — AB (ref 14.0–49.7)
MCH: 30.4 pg (ref 25.1–34.0)
MCHC: 32.4 g/dL (ref 31.5–36.0)
MCV: 93.7 fL (ref 79.5–101.0)
MONO#: 0.9 10*3/uL (ref 0.1–0.9)
MONO%: 4.4 % (ref 0.0–14.0)
NEUT#: 19 10*3/uL — ABNORMAL HIGH (ref 1.5–6.5)
NEUT%: 92.1 % — ABNORMAL HIGH (ref 38.4–76.8)
Platelets: 197 10*3/uL (ref 145–400)
RBC: 3.76 10*6/uL (ref 3.70–5.45)
RDW: 20 % — ABNORMAL HIGH (ref 11.2–14.5)
WBC: 20.6 10*3/uL — AB (ref 3.9–10.3)
lymph#: 0.7 10*3/uL — ABNORMAL LOW (ref 0.9–3.3)

## 2015-01-17 MED ORDER — SODIUM CHLORIDE 0.9 % IV SOLN
Freq: Once | INTRAVENOUS | Status: AC
  Administered 2015-01-17: 12:00:00 via INTRAVENOUS

## 2015-01-17 MED ORDER — HEPARIN SOD (PORK) LOCK FLUSH 100 UNIT/ML IV SOLN
500.0000 [IU] | Freq: Once | INTRAVENOUS | Status: AC | PRN
  Administered 2015-01-17: 500 [IU]
  Filled 2015-01-17: qty 5

## 2015-01-17 MED ORDER — SODIUM CHLORIDE 0.9 % IV SOLN
3.0000 mg | Freq: Once | INTRAVENOUS | Status: AC
  Administered 2015-01-17: 3 mg via INTRAVENOUS
  Filled 2015-01-17: qty 6

## 2015-01-17 MED ORDER — LORAZEPAM 0.5 MG PO TABS: 0.5000 mg | ORAL_TABLET | Freq: Four times a day (QID) | ORAL | Status: DC | PRN

## 2015-01-17 MED ORDER — SODIUM CHLORIDE 0.9 % IV SOLN
Freq: Once | INTRAVENOUS | Status: AC
  Administered 2015-01-17: 13:00:00 via INTRAVENOUS
  Filled 2015-01-17: qty 4

## 2015-01-17 MED ORDER — SODIUM CHLORIDE 0.9 % IJ SOLN
10.0000 mL | INTRAMUSCULAR | Status: DC | PRN
  Administered 2015-01-17: 10 mL
  Filled 2015-01-17: qty 10

## 2015-01-17 NOTE — Progress Notes (Signed)
OK to treat despite labs per Altamese Dilling NP.

## 2015-01-17 NOTE — Telephone Encounter (Signed)
Appointments adjusted per pof and and patient will get an avs in chemo

## 2015-01-17 NOTE — Progress Notes (Signed)
Patient Care Team: Lucretia Kern, DO as PCP - General (Family Medicine)  DIAGNOSIS: Breast cancer of upper-outer quadrant of left female breast   Staging form: Breast, AJCC 7th Edition     Clinical: Stage IIIB (T4, N1, cM0) - Signed by Thea Silversmith, MD on 10/29/2012       Prognostic indicators: ER-, PR- HER2 -      Pathologic: No stage assigned - Unsigned       Prognostic indicators: ER-, PR- HER2 -    SUMMARY OF ONCOLOGIC HISTORY:   Breast cancer of upper-outer quadrant of left female breast   10/06/1986 Initial Diagnosis Right breast mastectomy, breast cancer diagnosed during pregnancy, no adjuvant treatment given   09/30/2012 Initial Biopsy Left breast biopsy done for left nipple retraction: Invasive ductal carcinoma grade 3, ER/PR negative, HER-2 negative, Ki-67 21% with grade 3 DCIS   10/12/2012 Breast MRI Left breast diffuse enhancement measuring 13 x 12.5 x 9.5 cm with diffuse skin thickening and dermal enhancement, multiple enlarged level I left axillary lymph nodes largest 2.4 cm   11/02/2012 - 06/06/2013 Neo-Adjuvant Chemotherapy Neoadjuvant FEC 4 followed by Taxol carbo 8 discontinued for neuropathy, Gemzar Carbo 2   09/15/2013 - 11/01/2013 Radiation Therapy Xeloda with adjuvant radiation   10/30/2014 Imaging Right hilar node 1.6 cm, subcarinal 1.2 cm, 1 cm additional right hilar, right lower lobe 3.2 cm, RUL 1 cm, and LDL of 1.3 cm, moderate pleural effusion, right liver 0.8 cm, 15 cm fluid collection left chest wall   10/31/2014 Initial Biopsy Lung biopsy right lower lobe: Carcinoma with Extensive necrosis, microscopic foci of nonnecrotic non-small cell, poorly differentiated carcinoma, metastatic disease likely ER 0%, PR 0%, HER-2 negative   12/01/2014 Procedure Second thoracentesis for malignant pleural effusion   12/06/2014 -  Chemotherapy Halaven day 1 day 8 every 3 weeks    CHIEF COMPLIANT: Halaven cycle 3 day 1  INTERVAL HISTORY: Christine [name] is a 59 year old with  above-mentioned history of metastatic breast cancer with malignant pleural effusion currently on palliative chemotherapy with Helaven. She had talc pleurodesis performed yesterday. She is having a fair amount of discomfort in her right ribs today. Using oxycodone which has been working for her. She thinks she is having a little more dyspnea today. Wearing oxygen at 2 L/m. Requests a refill on her Ativan today.   REVIEW OF SYSTEMS:   Constitutional: Denies fevers, chills or abnormal weight loss Eyes: Denies blurriness of vision Ears, nose, mouth, throat, and face: Denies mucositis or sore throat Respiratory: Shortness of breath uses nasal cannula oxygen intermittently. Cardiovascular: Denies palpitation, chest discomfort or lower extremity swelling Gastrointestinal:  Denies nausea, heartburn or change in bowel habits Skin: Denies abnormal skin rashes Lymphatics: Denies new lymphadenopathy or easy bruising Neurological:Denies numbness, tingling or new weaknesses Behavioral/Psych: Mood is stable, no new changes   All other systems were reviewed with the patient and are negative.  I have reviewed the past medical history, past surgical history, social history and family history with the patient and they are unchanged from previous note.  ALLERGIES:  is allergic to sulfa antibiotics; sulfonamide derivatives; and tomato.  MEDICATIONS:  Current Outpatient Prescriptions  Medication Sig Dispense Refill  . Blood Glucose Monitoring Suppl (ONETOUCH VERIO FLEX SYSTEM) W/DEVICE KIT 1 Device by Does not apply route once. 1 kit 0  . cetirizine (ZYRTEC) 10 MG tablet Take 10 mg by mouth daily.    . cholecalciferol (VITAMIN D) 1000 UNITS tablet Take 1,000 Units by mouth every morning.     Marland Kitchen  diltiazem (CARDIZEM CD) 120 MG 24 hr capsule Take 1 capsule (120 mg total) by mouth every morning. 90 capsule 1  . esomeprazole (NEXIUM) 20 MG capsule Take 20 mg by mouth daily.     . fluticasone (FLONASE) 50 MCG/ACT  nasal spray Place 2 sprays into both nostrils daily as needed for allergies or rhinitis.    Marland Kitchen glucose blood (ONETOUCH VERIO) test strip Use as instructed to check blood sugar once a day 100 each 1  . KLOR-CON M20 20 MEQ tablet TAKE 1 TABLET (20 MEQ TOTAL) BY MOUTH 2 (TWO) TIMES DAILY. 60 tablet 0  . Lancets (ONETOUCH ULTRASOFT) lancets Use as instructed 100 each 1  . latanoprost (XALATAN) 0.005 % ophthalmic solution Place 1 drop into both eyes at bedtime.     Marland Kitchen levothyroxine (SYNTHROID, LEVOTHROID) 50 MCG tablet Take 1 tablet (50 mcg total) by mouth daily. 90 tablet 3  . lidocaine-prilocaine (EMLA) cream Apply to affected area once (Patient taking differently: Apply 1 application topically as needed (for chemo). Apply to affected area once) 30 g 3  . LORazepam (ATIVAN) 0.5 MG tablet Take 1 tablet (0.5 mg total) by mouth every 6 (six) hours as needed (Nausea or vomiting). 30 tablet 0  . metFORMIN (GLUCOPHAGE) 500 MG tablet Take $RemoveBef'1000mg'OufMsYeauS$  (2 tabs) in the morning and $RemoveBef'500mg'ZJuXvGdMSm$  (1 tab) in the evening with meals 135 tablet 3  . naproxen sodium (ANAPROX) 220 MG tablet Take 440 mg by mouth daily as needed (pain).    . NON FORMULARY Apply 1 application topically as needed (FUNGIFOAM - Applies to feet for athlete's foot.).     Marland Kitchen ondansetron (ZOFRAN) 8 MG tablet Take 1 tablet (8 mg total) by mouth 2 (two) times daily. Start the day after chemo for 2 days. Then take as needed for nausea or vomiting. 30 tablet 1  . oxyCODONE (OXY IR/ROXICODONE) 5 MG immediate release tablet Take 1 tablet (5 mg total) by mouth every 4 (four) hours as needed for moderate pain. 30 tablet 0  . prochlorperazine (COMPAZINE) 10 MG tablet Take 1 tablet (10 mg total) by mouth every 6 (six) hours as needed (Nausea or vomiting). 30 tablet 1  . Tetrahydrozoline HCl (VISINE OP) Apply 1-2 drops to eye daily as needed (itchy eyes.).     No current facility-administered medications for this visit.   Facility-Administered Medications Ordered in Other  Visits  Medication Dose Route Frequency Provider Last Rate Last Dose  . eriBULin mesylate (HALAVEN) 3 mg in sodium chloride 0.9 % 100 mL chemo infusion  3 mg Intravenous Once Nicholas Lose, MD      . heparin lock flush 100 unit/mL  500 Units Intracatheter Once PRN Nicholas Lose, MD      . sodium chloride 0.9 % injection 10 mL  10 mL Intracatheter PRN Nicholas Lose, MD        PHYSICAL EXAMINATION: ECOG PERFORMANCE STATUS: 1 - Symptomatic but completely ambulatory  Filed Vitals:   01/17/15 1058  BP: 122/60  Pulse: 108  Temp: 97.4 F (36.3 C)  Resp: 18   Filed Weights   01/17/15 1058  Weight: 205 lb 11.2 oz (93.305 kg)    GENERAL:alert, no distress and comfortable SKIN: skin color, texture, turgor are normal, no rashes or significant lesions EYES: normal, Conjunctiva are pink and non-injected, sclera clear OROPHARYNX:no exudate, no erythema and lips, buccal mucosa, and tongue normal  NECK: supple, thyroid normal size, non-tender, without nodularity LYMPH:  no palpable lymphadenopathy in the cervical, axillary or inguinal LUNGS:  Diminished breath sounds in the right lung base and dullness to percussion HEART: regular rate & rhythm and no murmurs and no lower extremity edema ABDOMEN:abdomen soft, non-tender and normal bowel sounds Musculoskeletal:no cyanosis of digits and no clubbing  NEURO: alert & oriented x 3 with fluent speech, no focal motor/sensory deficits  LABORATORY DATA:  I have reviewed the data as listed   Chemistry      Component Value Date/Time   NA 142 01/17/2015 1044   NA 140 12/03/2014 0733   K 3.3* 01/17/2015 1044   K 3.7 12/03/2014 0733   CL 101 12/03/2014 0733   CL 101 01/25/2013 0952   CO2 24 01/17/2015 1044   CO2 29 12/03/2014 0733   BUN 10.1 01/17/2015 1044   BUN 14 12/03/2014 0733   CREATININE 0.9 01/17/2015 1044   CREATININE 1.10* 12/03/2014 0733      Component Value Date/Time   CALCIUM 6.4* 01/17/2015 1044   CALCIUM 8.5* 12/03/2014 0733    ALKPHOS 74 01/17/2015 1044   ALKPHOS 76 12/02/2014 0532   AST 26 01/17/2015 1044   AST 28 12/02/2014 0532   ALT 19 01/17/2015 1044   ALT 19 12/02/2014 0532   BILITOT 1.47* 01/17/2015 1044   BILITOT 0.5 12/02/2014 0532       Lab Results  Component Value Date   WBC 20.6* 01/17/2015   HGB 11.4* 01/17/2015   HCT 35.2 01/17/2015   MCV 93.7 01/17/2015   PLT 197 01/17/2015   NEUTROABS 19.0* 01/17/2015    ASSESSMENT & PLAN:  Breast cancer of upper-outer quadrant of left female breast 09/30/2012 :Stage III, triple negative, invasive ductal carcinoma of the left breast.  Neoadjuvant chemotherapy following the surgery followed by adjuvant radiation with concurrent Xeloda. 3.9 cm size and 9/13 lymph nodes were positive with extracapsular extension. ER/PR negative HER-2 negative Ki-67 21% March 2016: CT chest revealed bilateral lung nodules biopsy-proven to be triple negative metastatic carcinoma , pleural effusion status post thoracentesis malignant cells on cytology, repeat thoracentesis 12/01/2014 and 12/12/2014 PET CT scan 11/27/2014 showed widespread metastatic disease to the thorax with multiple pulmonary nodules and masses and malignant right pleural effusion and right hilar and mediastinal malignant lymph nodes.  Current Treatment: Cycle 3 day 1 Halaven (days 1,8 q 3 weeks) started 12/06/14 Shortness of breath: Related to malignant pleural effusion status post 3 thoracentesis. She is currently on oxygen by nasal cannula. Status post talc pleurodesis on 01/16/2015.  Halaven toxicities: 1. Neutropenia: We started giving her Neulasta on day 8 of each cycle 2. Mild thrombocytopenia 3. Anemia grade 1 4. Hypocalcemia. Discussed this today with Dr. Alen Blew who is on-call. The patient is not currently taking any calcium supplementation therefore she was instructed to take calcium carbonate 1 tablet twice a day. She is due for a recheck of her chemistry next week when she returns for cycle 3 day 8.   5. Mild hyperbilirubinemia. This appears to be new. Also discussed with Dr. Alen Blew who is on-call today. He did not feel as elevation was enough to hold her chemotherapy. Review of recent PET scan did not show any liver lesions, but did show a small gallstone. I will discuss this further with Dr. Lindi Adie next week when he returns to see if any additional workup is needed.    Return to clinic in  3 weeks for cycle 4. The patient has a restaging CT of the chest ordered for late June 2016. No orders of the defined types were placed in this encounter.  The patient has a good understanding of the overall plan. she agrees with it. she will call with any problems that may develop before the next visit here.   Mikey Bussing, NP     Bilateral humeri

## 2015-01-17 NOTE — Patient Instructions (Signed)
[facility] Discharge Instructions for Patients Receiving Chemotherapy  Today you received the following chemotherapy agents Halaven.  To help prevent nausea and vomiting after your treatment, we encourage you to take your nausea medication.   If you develop nausea and vomiting that is not controlled by your nausea medication, call the clinic.   BELOW ARE SYMPTOMS THAT SHOULD BE REPORTED IMMEDIATELY:  *FEVER GREATER THAN 100.5 F  *CHILLS WITH OR WITHOUT FEVER  NAUSEA AND VOMITING THAT IS NOT CONTROLLED WITH YOUR NAUSEA MEDICATION  *UNUSUAL SHORTNESS OF BREATH  *UNUSUAL BRUISING OR BLEEDING  TENDERNESS IN MOUTH AND THROAT WITH OR WITHOUT PRESENCE OF ULCERS  *URINARY PROBLEMS  *BOWEL PROBLEMS  UNUSUAL RASH Items with * indicate a potential emergency and should be followed up as soon as possible.  Feel free to call the clinic you have any questions or concerns. The clinic phone number is [phone].  Please show the CHEMO ALERT CARD at check-in to the Emergency Department and triage nurse.   

## 2015-01-22 ENCOUNTER — Ambulatory Visit
Admission: RE | Admit: 2015-01-22 | Discharge: 2015-01-22 | Disposition: A | Discharge status: Home / Self Care | Payer: BLUE CROSS/BLUE SHIELD | Source: Ambulatory Visit | Attending: Surgery | Admitting: Surgery

## 2015-01-22 ENCOUNTER — Ambulatory Visit (INDEPENDENT_AMBULATORY_CARE_PROVIDER_SITE_OTHER): Payer: Self-pay | Admitting: Physician Assistant

## 2015-01-22 ENCOUNTER — Other Ambulatory Visit: Payer: Self-pay | Admitting: *Deleted

## 2015-01-22 VITALS — BP 121/72 | HR 129 | Temp 98.9°F | Resp 20 | Ht 64.0 in | Wt 205.0 lb

## 2015-01-22 DIAGNOSIS — J910 Malignant pleural effusion: Secondary | ICD-10-CM

## 2015-01-22 DIAGNOSIS — Z8709 Personal history of other diseases of the respiratory system: Secondary | ICD-10-CM

## 2015-01-22 NOTE — Progress Notes (Signed)
HPI:  Patient presents with complaints of cough and shortness of breath.  She has Breast Cancer with Malignant Pleural effusion on the right side.  She underwent Talc Pleurodesis and removal of her Pleur-x catheter on 01/16/2015.  She tolerated that procedure without difficulty.  She contacted the office today with complaint of 4 days of cough and shortness of breath.  She states the cough has been getting worse over the past few days.  She states due to this she has become short of breath and has had to start using home oxygen again.  She denies fevers, chills, and sweats.  Current Outpatient Prescriptions  Medication Sig Dispense Refill  . Blood Glucose Monitoring Suppl (ONETOUCH VERIO FLEX SYSTEM) W/DEVICE KIT 1 Device by Does not apply route once. 1 kit 0  . cetirizine (ZYRTEC) 10 MG tablet Take 10 mg by mouth daily.    . cholecalciferol (VITAMIN D) 1000 UNITS tablet Take 1,000 Units by mouth every morning.     . diltiazem (CARDIZEM CD) 120 MG 24 hr capsule Take 1 capsule (120 mg total) by mouth every morning. 90 capsule 1  . esomeprazole (NEXIUM) 20 MG capsule Take 20 mg by mouth daily.     . fluticasone (FLONASE) 50 MCG/ACT nasal spray Place 2 sprays into both nostrils daily as needed for allergies or rhinitis.    Marland Kitchen glucose blood (ONETOUCH VERIO) test strip Use as instructed to check blood sugar once a day 100 each 1  . KLOR-CON M20 20 MEQ tablet TAKE 1 TABLET (20 MEQ TOTAL) BY MOUTH 2 (TWO) TIMES DAILY. 60 tablet 0  . Lancets (ONETOUCH ULTRASOFT) lancets Use as instructed 100 each 1  . latanoprost (XALATAN) 0.005 % ophthalmic solution Place 1 drop into both eyes at bedtime.     Marland Kitchen levothyroxine (SYNTHROID, LEVOTHROID) 50 MCG tablet Take 1 tablet (50 mcg total) by mouth daily. 90 tablet 3  . lidocaine-prilocaine (EMLA) cream Apply to affected area once (Patient taking differently: Apply 1 application topically as needed (for chemo). Apply to affected area once) 30 g 3  . LORazepam (ATIVAN)  0.5 MG tablet Take 1 tablet (0.5 mg total) by mouth every 6 (six) hours as needed (Nausea or vomiting). 30 tablet 0  . metFORMIN (GLUCOPHAGE) 500 MG tablet Take $RemoveBef'1000mg'HAzUEROFFI$  (2 tabs) in the morning and $RemoveBef'500mg'IIsPcOgWHC$  (1 tab) in the evening with meals 135 tablet 3  . naproxen sodium (ANAPROX) 220 MG tablet Take 440 mg by mouth daily as needed (pain).    . NON FORMULARY Apply 1 application topically as needed (FUNGIFOAM - Applies to feet for athlete's foot.).     Marland Kitchen ondansetron (ZOFRAN) 8 MG tablet Take 1 tablet (8 mg total) by mouth 2 (two) times daily. Start the day after chemo for 2 days. Then take as needed for nausea or vomiting. 30 tablet 1  . oxyCODONE (OXY IR/ROXICODONE) 5 MG immediate release tablet Take 1 tablet (5 mg total) by mouth every 4 (four) hours as needed for moderate pain. 30 tablet 0  . prochlorperazine (COMPAZINE) 10 MG tablet Take 1 tablet (10 mg total) by mouth every 6 (six) hours as needed (Nausea or vomiting). 30 tablet 1  . Tetrahydrozoline HCl (VISINE OP) Apply 1-2 drops to eye daily as needed (itchy eyes.).     No current facility-administered medications for this visit.    Physical Exam:  BP 121/72 mmHg  Pulse 129  Temp(Src) 98.9 F (37.2 C) (Oral)  Resp 20  Ht $R'5\' 4"'lH$  (1.626 m)  Wt  205 lb (92.987 kg)  BMI 35.17 kg/m2  SpO2 97%  Gen:  No visible distress Heart: RRR, Tachy Lungs: Diminished on right Wound: well healed, no evidence of infection, sutures in place  Diagnostic Tests:  CXR: worsening pleural fluid on right in comparison to 01/12/2015   A/P:  1. Recurrent Malignant Pleural Effusion, Right- fluid accumulation is worse than film from 6/10. Patient now with cough and worsening shortness of breath, for which she has started to require home oxygen again 2. Dispo- will send patient for US guided Thoracentesis.Marland KitchenMarland Kitchen However explained to patient this is mainly for symptomatic relief and there is no guarantee that fluid will be removed.  There is some fluid there, however  it may be loculated due to recent administration of Talc.  She understands and is willing to undergo Thoracentesis for potential symptom relief.... She will RTC 2 weeks after Thoracentesis with repeat CXR with Dr. Bartholome Bill, PA-C [facility] [phone]

## 2015-01-24 ENCOUNTER — Ambulatory Visit: Payer: BLUE CROSS/BLUE SHIELD

## 2015-01-24 ENCOUNTER — Other Ambulatory Visit: Payer: Self-pay | Admitting: *Deleted

## 2015-01-24 ENCOUNTER — Ambulatory Visit (HOSPITAL_COMMUNITY)
Admission: RE | Admit: 2015-01-24 | Discharge: 2015-01-24 | Disposition: A | Discharge status: Home / Self Care | Payer: BLUE CROSS/BLUE SHIELD | Source: Ambulatory Visit | Attending: Physician Assistant | Admitting: Physician Assistant

## 2015-01-24 ENCOUNTER — Ambulatory Visit (HOSPITAL_COMMUNITY)
Admission: RE | Admit: 2015-01-24 | Discharge: 2015-01-24 | Disposition: A | Discharge status: Home / Self Care | Payer: BLUE CROSS/BLUE SHIELD | Source: Ambulatory Visit | Attending: Interventional Radiology | Admitting: Interventional Radiology

## 2015-01-24 ENCOUNTER — Ambulatory Visit (HOSPITAL_BASED_OUTPATIENT_CLINIC_OR_DEPARTMENT_OTHER): Payer: BLUE CROSS/BLUE SHIELD

## 2015-01-24 ENCOUNTER — Other Ambulatory Visit (HOSPITAL_COMMUNITY): Payer: Self-pay | Admitting: Interventional Radiology

## 2015-01-24 ENCOUNTER — Other Ambulatory Visit (HOSPITAL_BASED_OUTPATIENT_CLINIC_OR_DEPARTMENT_OTHER): Payer: BLUE CROSS/BLUE SHIELD

## 2015-01-24 DIAGNOSIS — C50412 Malignant neoplasm of upper-outer quadrant of left female breast: Secondary | ICD-10-CM

## 2015-01-24 DIAGNOSIS — J90 Pleural effusion, not elsewhere classified: Secondary | ICD-10-CM | POA: Insufficient documentation

## 2015-01-24 DIAGNOSIS — C50912 Malignant neoplasm of unspecified site of left female breast: Principal | ICD-10-CM

## 2015-01-24 DIAGNOSIS — J910 Malignant pleural effusion: Secondary | ICD-10-CM

## 2015-01-24 DIAGNOSIS — D701 Agranulocytosis secondary to cancer chemotherapy: Secondary | ICD-10-CM

## 2015-01-24 DIAGNOSIS — C50919 Malignant neoplasm of unspecified site of unspecified female breast: Secondary | ICD-10-CM | POA: Insufficient documentation

## 2015-01-24 DIAGNOSIS — Z95828 Presence of other vascular implants and grafts: Secondary | ICD-10-CM

## 2015-01-24 DIAGNOSIS — Z452 Encounter for adjustment and management of vascular access device: Secondary | ICD-10-CM

## 2015-01-24 DIAGNOSIS — C50911 Malignant neoplasm of unspecified site of right female breast: Secondary | ICD-10-CM

## 2015-01-24 LAB — CBC WITH DIFFERENTIAL/PLATELET
BASO%: 0 % (ref 0.0–2.0)
Basophils Absolute: 0 10*3/uL (ref 0.0–0.1)
EOS%: 0 % (ref 0.0–7.0)
Eosinophils Absolute: 0 10*3/uL (ref 0.0–0.5)
HCT: 29.1 % — ABNORMAL LOW (ref 34.8–46.6)
HEMOGLOBIN: 9.4 g/dL — AB (ref 11.6–15.9)
LYMPH#: 0.6 10*3/uL — AB (ref 0.9–3.3)
LYMPH%: 55.7 % — ABNORMAL HIGH (ref 14.0–49.7)
MCH: 30 pg (ref 25.1–34.0)
MCHC: 32.3 g/dL (ref 31.5–36.0)
MCV: 93 fL (ref 79.5–101.0)
MONO#: 0.5 10*3/uL (ref 0.1–0.9)
MONO%: 41.7 % — ABNORMAL HIGH (ref 0.0–14.0)
NEUT%: 2.6 % — ABNORMAL LOW (ref 38.4–76.8)
NEUTROS ABS: 0 10*3/uL — AB (ref 1.5–6.5)
NRBC: 0 % (ref 0–0)
Platelets: 102 10*3/uL — ABNORMAL LOW (ref 145–400)
RBC: 3.13 10*6/uL — ABNORMAL LOW (ref 3.70–5.45)
RDW: 17.6 % — AB (ref 11.2–14.5)
WBC: 1.2 10*3/uL — AB (ref 3.9–10.3)

## 2015-01-24 LAB — COMPREHENSIVE METABOLIC PANEL (CC13)
ALT: 13 U/L (ref 0–55)
ANION GAP: 10 meq/L (ref 3–11)
AST: 19 U/L (ref 5–34)
Albumin: 2.7 g/dL — ABNORMAL LOW (ref 3.5–5.0)
Alkaline Phosphatase: 61 U/L (ref 40–150)
BUN: 3.7 mg/dL — ABNORMAL LOW (ref 7.0–26.0)
CALCIUM: 8.1 mg/dL — AB (ref 8.4–10.4)
CHLORIDE: 99 meq/L (ref 98–109)
CO2: 32 meq/L — AB (ref 22–29)
CREATININE: 0.7 mg/dL (ref 0.6–1.1)
Glucose: 126 mg/dl (ref 70–140)
Potassium: 3.5 mEq/L (ref 3.5–5.1)
Sodium: 140 mEq/L (ref 136–145)
TOTAL PROTEIN: 6.1 g/dL — AB (ref 6.4–8.3)
Total Bilirubin: 0.69 mg/dL (ref 0.20–1.20)

## 2015-01-24 MED ORDER — LIDOCAINE HCL 1 % IJ SOLN
INTRAMUSCULAR | Status: AC
  Filled 2015-01-24: qty 20

## 2015-01-24 MED ORDER — PEGFILGRASTIM INJECTION 6 MG/0.6ML ~~LOC~~
6.0000 mg | PREFILLED_SYRINGE | Freq: Once | SUBCUTANEOUS | Status: AC
  Administered 2015-01-24: 6 mg via SUBCUTANEOUS
  Filled 2015-01-24: qty 0.6

## 2015-01-24 MED ORDER — SODIUM CHLORIDE 0.9 % IJ SOLN
10.0000 mL | INTRAMUSCULAR | Status: DC | PRN
  Administered 2015-01-24 (×2): 10 mL via INTRAVENOUS
  Filled 2015-01-24: qty 10

## 2015-01-24 MED ORDER — HEPARIN SOD (PORK) LOCK FLUSH 100 UNIT/ML IV SOLN
500.0000 [IU] | Freq: Once | INTRAVENOUS | Status: AC
  Administered 2015-01-24: 500 [IU] via INTRAVENOUS
  Filled 2015-01-24: qty 5

## 2015-01-24 NOTE — Patient Instructions (Signed)

## 2015-01-24 NOTE — Progress Notes (Signed)
Patient will not be treated today (01/24/15) due to [place]: 0. Neulasta injection given. Instructed patient on Neutropenic precautions. Patient verbalized understanding.

## 2015-01-25 ENCOUNTER — Encounter: Payer: BLUE CROSS/BLUE SHIELD | Admitting: Surgery

## 2015-01-26 ENCOUNTER — Telehealth: Payer: Self-pay | Admitting: *Deleted

## 2015-01-26 NOTE — Telephone Encounter (Signed)
Patient called wanting to know if she needed to have CT done next week as she missed a chemo treatment due to blood counts. CT to be done after completion of 3 cycles. Patient advised that she will get treatment on 7/6 and CT will be rescheduled on 7/11 at 2:30. She verbalized understanding.

## 2015-01-31 ENCOUNTER — Ambulatory Visit (HOSPITAL_BASED_OUTPATIENT_CLINIC_OR_DEPARTMENT_OTHER): Payer: BLUE CROSS/BLUE SHIELD | Admitting: Nurse Practitioner

## 2015-01-31 ENCOUNTER — Ambulatory Visit (HOSPITAL_COMMUNITY)
Admission: RE | Admit: 2015-01-31 | Discharge: 2015-01-31 | Disposition: A | Discharge status: Home / Self Care | Payer: BLUE CROSS/BLUE SHIELD | Source: Ambulatory Visit | Attending: Nurse Practitioner | Admitting: Nurse Practitioner

## 2015-01-31 ENCOUNTER — Ambulatory Visit (HOSPITAL_COMMUNITY): Payer: BLUE CROSS/BLUE SHIELD

## 2015-01-31 ENCOUNTER — Telehealth: Payer: Self-pay

## 2015-01-31 ENCOUNTER — Telehealth: Payer: Self-pay | Admitting: Nurse Practitioner

## 2015-01-31 ENCOUNTER — Other Ambulatory Visit (HOSPITAL_BASED_OUTPATIENT_CLINIC_OR_DEPARTMENT_OTHER): Payer: BLUE CROSS/BLUE SHIELD

## 2015-01-31 ENCOUNTER — Encounter (HOSPITAL_COMMUNITY): Payer: Self-pay

## 2015-01-31 VITALS — BP 129/77 | HR 117 | Temp 98.3°F | Resp 20 | Wt 191.8 lb

## 2015-01-31 DIAGNOSIS — C50911 Malignant neoplasm of unspecified site of right female breast: Secondary | ICD-10-CM

## 2015-01-31 DIAGNOSIS — J910 Malignant pleural effusion: Secondary | ICD-10-CM | POA: Diagnosis not present

## 2015-01-31 DIAGNOSIS — D6959 Other secondary thrombocytopenia: Secondary | ICD-10-CM | POA: Diagnosis not present

## 2015-01-31 DIAGNOSIS — R0602 Shortness of breath: Secondary | ICD-10-CM | POA: Insufficient documentation

## 2015-01-31 DIAGNOSIS — C50412 Malignant neoplasm of upper-outer quadrant of left female breast: Secondary | ICD-10-CM

## 2015-01-31 DIAGNOSIS — R911 Solitary pulmonary nodule: Secondary | ICD-10-CM | POA: Insufficient documentation

## 2015-01-31 DIAGNOSIS — E876 Hypokalemia: Secondary | ICD-10-CM

## 2015-01-31 DIAGNOSIS — C50912 Malignant neoplasm of unspecified site of left female breast: Principal | ICD-10-CM

## 2015-01-31 DIAGNOSIS — D701 Agranulocytosis secondary to cancer chemotherapy: Secondary | ICD-10-CM

## 2015-01-31 DIAGNOSIS — R630 Anorexia: Secondary | ICD-10-CM

## 2015-01-31 LAB — COMPREHENSIVE METABOLIC PANEL (CC13)
ALBUMIN: 3 g/dL — AB (ref 3.5–5.0)
ALT: 8 U/L (ref 0–55)
ANION GAP: 14 meq/L — AB (ref 3–11)
AST: 15 U/L (ref 5–34)
Alkaline Phosphatase: 136 U/L (ref 40–150)
BUN: 8.3 mg/dL (ref 7.0–26.0)
CALCIUM: 9.5 mg/dL (ref 8.4–10.4)
CHLORIDE: 100 meq/L (ref 98–109)
CO2: 27 mEq/L (ref 22–29)
Creatinine: 0.8 mg/dL (ref 0.6–1.1)
GLUCOSE: 139 mg/dL (ref 70–140)
POTASSIUM: 3.4 meq/L — AB (ref 3.5–5.1)
Sodium: 141 mEq/L (ref 136–145)
Total Bilirubin: 0.57 mg/dL (ref 0.20–1.20)
Total Protein: 6.7 g/dL (ref 6.4–8.3)

## 2015-01-31 LAB — CBC WITH DIFFERENTIAL/PLATELET
BASO%: 0.4 % (ref 0.0–2.0)
Basophils Absolute: 0.1 10*3/uL (ref 0.0–0.1)
EOS%: 0 % (ref 0.0–7.0)
Eosinophils Absolute: 0 10*3/uL (ref 0.0–0.5)
HCT: 30.9 % — ABNORMAL LOW (ref 34.8–46.6)
HGB: 9.8 g/dL — ABNORMAL LOW (ref 11.6–15.9)
LYMPH%: 5.8 % — AB (ref 14.0–49.7)
MCH: 29 pg (ref 25.1–34.0)
MCHC: 31.7 g/dL (ref 31.5–36.0)
MCV: 91.5 fL (ref 79.5–101.0)
MONO#: 1.3 10*3/uL — ABNORMAL HIGH (ref 0.1–0.9)
MONO%: 5.7 % (ref 0.0–14.0)
NEUT#: 20.4 10*3/uL — ABNORMAL HIGH (ref 1.5–6.5)
NEUT%: 88.1 % — ABNORMAL HIGH (ref 38.4–76.8)
PLATELETS: 210 10*3/uL (ref 145–400)
RBC: 3.38 10*6/uL — ABNORMAL LOW (ref 3.70–5.45)
RDW: 19.5 % — AB (ref 11.2–14.5)
WBC: 23.2 10*3/uL — AB (ref 3.9–10.3)
lymph#: 1.3 10*3/uL (ref 0.9–3.3)

## 2015-01-31 LAB — HOLD TUBE, BLOOD BANK

## 2015-01-31 MED ORDER — PREDNISONE 10 MG PO TABS: ORAL_TABLET | ORAL | Status: DC

## 2015-01-31 MED ORDER — IOHEXOL 300 MG/ML  SOLN
100.0000 mL | Freq: Once | INTRAMUSCULAR | Status: AC | PRN
  Administered 2015-01-31: 100 mL via INTRAVENOUS

## 2015-01-31 NOTE — Telephone Encounter (Signed)
Pt called stating she has a head cold and congestion. Low grade fever last week of 100. She states her HR will get up to 140 after a shower.

## 2015-01-31 NOTE — Telephone Encounter (Signed)
Labs/ov scheduled today with NP/CB, pt aware per 06/29 POF.... [name]

## 2015-01-31 NOTE — Telephone Encounter (Signed)
Pt described head congestion and constant blowing nose, occ prod cough. Clear mucus. And occasional bloody from nose.  Last night, pulse after shower was 140 and DOE, 93% sat without supplemental oxygen, and fatigue. Has home O2 she uses at night. Last labs 6/22. Last chemo 6/22 was held for low WBC. No apetite, no nausea, no vomiting. Slight diarrhea. Pt will get labs 1030. See [place] 1100. Pt was sounding SOB on phone and occasional cough.

## 2015-02-01 ENCOUNTER — Other Ambulatory Visit: Payer: Self-pay | Admitting: Surgery

## 2015-02-01 DIAGNOSIS — C7800 Secondary malignant neoplasm of unspecified lung: Secondary | ICD-10-CM

## 2015-02-02 ENCOUNTER — Other Ambulatory Visit: Payer: Self-pay | Admitting: Hematology and Oncology

## 2015-02-05 ENCOUNTER — Encounter: Payer: Self-pay | Admitting: Nurse Practitioner

## 2015-02-05 NOTE — Assessment & Plan Note (Signed)
Pt c/o minimal appetite in last few weeks. Advised pt to try eating multiple small meals throughout the day; and to push fluids.

## 2015-02-05 NOTE — Assessment & Plan Note (Signed)
Pt last received Eribulin chemo on 01/17/15.  Chemo was held last week secondary to neutropenia; and pt received neulasta for growth factor support.    Pt underwent CT of chest today for c/o increasing SOB, cough, and fatigue.    CT revealed:   IMPRESSION: 1. Decrease in right hilar mass. 2. 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 3. Interval decrease in right pleural effusion. Persistent loculated right pleural effusion is present. 4. Right pulmonary nodules also appear decreased in size on the difficult to measure due to the effusion  Pt scheduled to return 02/07/15 for labs, visit, and chemo.  Also has an appt with thoracic surgeon Dr. Cyndia Bent that same day for follow up and management of new CT findings.

## 2015-02-05 NOTE — Progress Notes (Signed)
SYMPTOM MANAGEMENT CLINIC   HPI: Christine [name] 59 y.o. female diagnosed with bilat breast cancer; with malignant pleural effusions.  Currently undergoing Eribulin chemotherapy.   Pt c/o increased SOB, nonproductive cough, and fatigue for past several days. Pt's right pleural drain discontinued and talc pleurodesis performed on 01/16/15.  Underwent thoracentesis last week removing approx 150 mls pleural fluid. Pt reports that she was only wearing her O2 at nite; but now has to wear the O2 24/7.  She has also noted increased heart rate recently; and decreased O2 sats to 90-93% on 2 liters. Denies any fever or chills.   HPI  ROS  Past Medical History  Diagnosis Date  . ALLERGIC RHINITIS   . GERD (gastroesophageal reflux disease)   . Hypertension   . Hypothyroidism   . Hx of colonic polyps   . Wears glasses   . Radiation 09/15/13-11/01/13    Left chest wall/PAB/scar/supraclavicular fossa  . SYNCOPE 05/15/2010    Qualifier: Diagnosis of  By: Lovell Sheehan MD, Balinda Quails CERVICAL STRAIN, WITH RADICULOPATHY 10/01/2007    Qualifier: Diagnosis of  By: Lovell Sheehan MD, Balinda Quails   . Rosacea 05/31/2007    Qualifier: Diagnosis of  By: Lovell Sheehan MD, Balinda Quails COLONIC POLYPS, HX OF 05/31/2007    Qualifier: Diagnosis of  By: Lovell Sheehan MD, Balinda Quails   . Lymph edema L arm from breast ca tx 12/22/2013  . H/O blood clots     hx of small blood clots in both legs  . Diabetes mellitus without complication     type 2 - on Metformin  . Anxiety   . Neuropathy due to chemotherapeutic drug   . Breast cancer 52, age 36    right  . Breast cancer 09/30/12    left, ER/PR -, Her 2 -  . Secondary lung cancer 2016  . Anemia     during chemo  . History of IBS   . Glaucoma   . Dry skin   . Lymphedema of arm     left arm, post mastectomy  . Shortness of breath dyspnea     with exertion (since onset of pleural effusion) Wears O2 2 L as needed    Past Surgical History  Procedure Laterality Date  . Tonsillectomy    .  Caesarean section      x 1  . Mastectomy  04/1987    right side  . Abdominal hysterectomy  06/1988    fibroids  . Portacath placement Right 10/28/2012    Procedure: PORT PLACEMENT;  Surgeon: Clovis Pu. Cornett, MD;  Location: [facility];  Service: General;  Laterality: Right;  Right Subclavian Vein  . Mastectomy modified radical Left 07/19/2013    Procedure: MASTECTOMY MODIFIED RADICAL;  Surgeon: Maisie Fus A. Cornett, MD;  Location: [facility];  Service: General;  Laterality: Left;  . Port-a-cath removal Right 07/19/2013    Procedure: REMOVAL PORT-A-CATH;  Surgeon: Clovis Pu. Cornett, MD;  Location: [facility];  Service: General;  Laterality: Right;  . Cesarean section    . Breast surgery Left 07/19/13    MRM  . Breast surgery Right     mastectomy  . Port-a-cath insertion  12/03/2014  . Colonoscopy    . Chest tube insertion Right 01/02/2015    Procedure: INSERTION PLEURAL DRAINAGE CATHETER RIGHT CHEST;  Surgeon: Alleen Borne, MD;  Location: [facility] OR;  Service: Thoracic;  Laterality: Right;  . Removal of pleural drainage catheter  Right 01/16/2015    Procedure: REMOVAL OF PLEURAL DRAINAGE CATHETER;  Surgeon: Gaye Pollack, MD;  Location: [facility];  Service: Thoracic;  Laterality: Right;  . Talc pleurodesis Right 01/16/2015    Procedure: Pietro Cassis;  Surgeon: Gaye Pollack, MD;  Location: [facility];  Service: Thoracic;  Laterality: Right;    has Hypothyroidism; MORBID OBESITY; ANXIETY DEPRESSION; Essential hypertension; ALLERGIC RHINITIS; GERD; Impaired fasting glucose; Breast cancer of upper-outer quadrant of left female breast; B12 deficiency; Vitamin D deficiency; Lymph edema L arm from breast ca tx; Nausea and vomiting; Lung metastasis; Hypokalemia; EKG abnormality; Malignant pleural effusion; Lymphedema; Recurrent right pleural effusion; Pleural effusion; and Anorexia on her problem list.    is allergic to sulfa antibiotics; sulfonamide derivatives; and  tomato.    Medication List       This list is accurate as of: 01/31/15 11:59 PM.  Always use your most recent med list.               cetirizine 10 MG tablet  Commonly known as:  ZYRTEC  Take 10 mg by mouth daily.     cholecalciferol 1000 UNITS tablet  Commonly known as:  VITAMIN D  Take 1,000 Units by mouth every morning.     diltiazem 120 MG 24 hr capsule  Commonly known as:  CARDIZEM CD  Take 1 capsule (120 mg total) by mouth every morning.     esomeprazole 20 MG capsule  Commonly known as:  NEXIUM  Take 20 mg by mouth daily.     fluticasone 50 MCG/ACT nasal spray  Commonly known as:  FLONASE  Place 2 sprays into both nostrils daily as needed for allergies or rhinitis.     glucose blood test strip  Commonly known as:  ONETOUCH VERIO  Use as instructed to check blood sugar once a day     KLOR-CON M20 20 MEQ tablet  Generic drug:  potassium chloride SA  TAKE 1 TABLET (20 MEQ TOTAL) BY MOUTH 2 (TWO) TIMES DAILY.     latanoprost 0.005 % ophthalmic solution  Commonly known as:  XALATAN  Place 1 drop into both eyes at bedtime.     levothyroxine 50 MCG tablet  Commonly known as:  SYNTHROID, LEVOTHROID  Take 1 tablet (50 mcg total) by mouth daily.     lidocaine-prilocaine cream  Commonly known as:  EMLA  Apply to affected area once     LORazepam 0.5 MG tablet  Commonly known as:  ATIVAN  Take 1 tablet (0.5 mg total) by mouth every 6 (six) hours as needed (Nausea or vomiting).     metFORMIN 500 MG tablet  Commonly known as:  GLUCOPHAGE  Take $Rem'1000mg'bLTu$  (2 tabs) in the morning and $RemoveBef'500mg'YYLiKzJIHS$  (1 tab) in the evening with meals     naproxen sodium 220 MG tablet  Commonly known as:  ANAPROX  Take 440 mg by mouth daily as needed (pain).     NON FORMULARY  Apply 1 application topically as needed (FUNGIFOAM - Applies to feet for athlete's foot.).     ondansetron 8 MG tablet  Commonly known as:  ZOFRAN  Take 1 tablet (8 mg total) by mouth 2 (two) times daily. Start the day  after chemo for 2 days. Then take as needed for nausea or vomiting.     onetouch ultrasoft lancets  Use as instructed     ONETOUCH VERIO FLEX SYSTEM W/DEVICE Kit  1 Device by Does not apply route once.  oxyCODONE 5 MG immediate release tablet  Commonly known as:  Oxy IR/ROXICODONE  Take 1 tablet (5 mg total) by mouth every 4 (four) hours as needed for moderate pain.     predniSONE 10 MG tablet  Commonly known as:  DELTASONE  50 mg QD x 2 days, then 40 mg QD x 2 days, then 30 mg QD x 2 days, then 20 mg QD x 2 days, then 10 mg QD x 2 days.     prochlorperazine 10 MG tablet  Commonly known as:  COMPAZINE  Take 1 tablet (10 mg total) by mouth every 6 (six) hours as needed (Nausea or vomiting).     VISINE OP  Apply 1-2 drops to eye daily as needed (itchy eyes.).         PHYSICAL EXAMINATION  Oncology Vitals 01/31/2015 01/24/2015 01/22/2015 01/17/2015 01/16/2015 01/12/2015 01/04/2015  Height - - 163 cm 163 cm - 163 cm -  Weight 87 kg - 92.987 kg 93.305 kg - 93.441 kg -  Weight (lbs) 191 lbs 13 oz - 205 lbs 205 lbs 11 oz - 206 lbs -  BMI (kg/m2) - - 35.19 kg/m2 35.31 kg/m2 - 35.36 kg/m2 -  Temp 98.3 98.2 98.9 97.4 98.6 - 98.9  Pulse 117 112 129 108 101 107 105  Resp $Rem'20 18 20 18 16 16 18  'abvq$ SpO2 100 99 97 96 97 94 99  BSA (m2) - - 2.05 m2 2.05 m2 - 2.05 m2 -   BP Readings from Last 3 Encounters:  01/31/15 129/77  01/24/15 113/67  01/24/15 108/88    Physical Exam  Constitutional: She is oriented to person, place, and time.  Pt appears fatigued, frail, and chronically ill.   HENT:  Head: Normocephalic and atraumatic.  Mouth/Throat: Oropharynx is clear and moist.  Eyes: Conjunctivae and EOM are normal. Pupils are equal, round, and reactive to light. Right eye exhibits no discharge. Left eye exhibits no discharge. No scleral icterus.  Neck: Normal range of motion. Neck supple. No JVD present. No tracheal deviation present. No thyromegaly present.  Cardiovascular: Regular rhythm,  normal heart sounds and intact distal pulses.   Tachy.   Pulmonary/Chest: No stridor. No respiratory distress. She has no wheezes. She has no rales. She exhibits no tenderness.  Slightly diminshed bilat bases; and mild SOB. No distress.   Abdominal: Soft. Bowel sounds are normal. She exhibits no distension and no mass. There is no tenderness. There is no rebound and no guarding.  Musculoskeletal: Normal range of motion. She exhibits no edema or tenderness.  Lymphadenopathy:    She has no cervical adenopathy.  Neurological: She is oriented to person, place, and time.  Skin: Skin is warm and dry. No rash noted. No erythema. There is pallor.  Psychiatric: Affect normal.  Nursing note and vitals reviewed.   LABORATORY DATA:. Appointment on 01/31/2015  Component Date Value Ref Range Status  . WBC 01/31/2015 23.2* 3.9 - 10.3 10e3/uL Final  . NEUT# 01/31/2015 20.4* 1.5 - 6.5 10e3/uL Final  . HGB 01/31/2015 9.8* 11.6 - 15.9 g/dL Final  . HCT 01/31/2015 30.9* 34.8 - 46.6 % Final  . Platelets 01/31/2015 210  145 - 400 10e3/uL Final  . MCV 01/31/2015 91.5  79.5 - 101.0 fL Final  . MCH 01/31/2015 29.0  25.1 - 34.0 pg Final  . MCHC 01/31/2015 31.7  31.5 - 36.0 g/dL Final  . RBC 01/31/2015 3.38* 3.70 - 5.45 10e6/uL Final  . RDW 01/31/2015 19.5* 11.2 - 14.5 %  Final  . lymph# 01/31/2015 1.3  0.9 - 3.3 10e3/uL Final  . MONO# 01/31/2015 1.3* 0.1 - 0.9 10e3/uL Final  . Eosinophils Absolute 01/31/2015 0.0  0.0 - 0.5 10e3/uL Final  . Basophils Absolute 01/31/2015 0.1  0.0 - 0.1 10e3/uL Final  . NEUT% 01/31/2015 88.1* 38.4 - 76.8 % Final  . LYMPH% 01/31/2015 5.8* 14.0 - 49.7 % Final  . MONO% 01/31/2015 5.7  0.0 - 14.0 % Final  . EOS% 01/31/2015 0.0  0.0 - 7.0 % Final  . BASO% 01/31/2015 0.4  0.0 - 2.0 % Final  . Hold Tube, Blood Bank 01/31/2015 Blood Bank Order Cancelled   Final  . Sodium 01/31/2015 141  136 - 145 mEq/L Final  . Potassium 01/31/2015 3.4* 3.5 - 5.1 mEq/L Final  . Chloride 01/31/2015  100  98 - 109 mEq/L Final  . CO2 01/31/2015 27  22 - 29 mEq/L Final  . Glucose 01/31/2015 139  70 - 140 mg/dl Final  . BUN 01/31/2015 8.3  7.0 - 26.0 mg/dL Final  . Creatinine 01/31/2015 0.8  0.6 - 1.1 mg/dL Final  . Total Bilirubin 01/31/2015 0.57  0.20 - 1.20 mg/dL Final  . Alkaline Phosphatase 01/31/2015 136  40 - 150 U/L Final  . AST 01/31/2015 15  5 - 34 U/L Final  . ALT 01/31/2015 8  0 - 55 U/L Final  . Total Protein 01/31/2015 6.7  6.4 - 8.3 g/dL Final  . Albumin 01/31/2015 3.0* 3.5 - 5.0 g/dL Final  . Calcium 01/31/2015 9.5  8.4 - 10.4 mg/dL Final  . Anion Gap 01/31/2015 14* 3 - 11 mEq/L Final  . EGFR 01/31/2015 >90  >90 ml/min/1.73 m2 Final   eGFR is calculated using the CKD-EPI Creatinine Equation (2009)     RADIOGRAPHIC STUDIES: No results found.  ASSESSMENT/PLAN:    Anorexia Pt c/o minimal appetite in last few weeks. Advised pt to try eating multiple small meals throughout the day; and to push fluids.   Breast cancer of upper-outer quadrant of left female breast Pt last received Eribulin chemo on 01/17/15.  Chemo was held last week secondary to neutropenia; and pt received neulasta for growth factor support.    Pt underwent CT of chest today for c/o increasing SOB, cough, and fatigue.    CT revealed:   IMPRESSION: 1. Decrease in right hilar mass. 2. 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 3. Interval decrease in right pleural effusion. Persistent loculated right pleural effusion is present. 4. Right pulmonary nodules also appear decreased in size on the difficult to measure due to the effusion  Pt scheduled to return 02/07/15 for labs, visit, and chemo.  Also has an appt with thoracic surgeon Dr. Cyndia Bent that same day for follow up and management of new CT findings.   Hypokalemia K+ 3.4.  Pt already has K+ tabs at home; but occ forgets to take them.  Advised to take the K+ tabs QD as directed.   Malignant pleural effusion Pt c/o increased  SOB, nonproductive cough, and fatigue for past several days. Pt's right pleural drain discontinued and talc pleurodesis performed on 01/16/15.  Underwent thoracentesis last week removing approx 150 mls pleural fluid. Pt reports that she was only wearing her O2 at nite; but now has to wear the O2 24/7.  She has also noted increased heart rate recently; and decreased O2 sats to 90-93% on 2 liters. Denies any fever or chills.   On exam- diminished breath sounds to bilat bases.  Slight SOB noted on exam.    After reviewing findings with Dr. Lindi Adie- obtained chest CT for further eval.   CT chest revealed:   FINDINGS: Mediastinum/Nodes: No axillary lymphadenopathy. Bilateral axillary clips are noted. No mediastinal or hilar lymphadenopathy. No pericardial fluid. Right perihilar mass seen on comparison PET-CT scan is no longer evident.  Lungs/Pleura: There is loculated fluid collections within the right hemi thorax. Rounded 2 cm fluid collection in the right lower lobe also appears to be loculated fluid (image 40, series 2). There is atelectasis within the right upper lobe and right lower lobe associated with the fluid. Within the right middle lobe there is a 5 mm nodule image 41. Difficult to place on comparison exam but of appears likely unchanged from 6 mm nodule. Peripheral nodule in the right lower lobe measuring 4 mm is not seen on prior due to large pleural effusion.  In the left lung, left lower lobe nodule measures 9 mm on image 22, series 4. This nodule is decreased in size from 15 mm on PET-CT scan. Left lower lobe nodule image 29 measuring 9 mm decreased from 11 mm.  Upper abdomen: No focal hepatic lesion limited view of the liver. Adrenal glands are normal.  Musculoskeletal: There is a fluid collection within the anterior right chest wall which may represent malshaped breast prosthetic. No aggressive osseous lesion.  IMPRESSION: 1. Decrease in right hilar mass. 2.  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 3. Interval decrease in right pleural effusion. Persistent loculated right pleural effusion is present. 4. Right pulmonary nodules also appear decreased in size on the difficult to measure due to the effusion  Called to review findings  with Dr. Prescott Gum thoracic surgeon (on call for Dr. Gilford Raid)- who advised that pt be seen in Thoracic Surgery clinic next week for further eval and management of complaints. Dr. Prescott Gum feels that increased pulmonary irritation and SOB could very well be associated with recent instillation of talc.   Since pt obtained restaging CT today- will cancel chest CT scheduled for 02/12/15.    Pt advised to follow up with Dr. Cyndia Bent next week as planned.    Advised pt to go directly to the ED for worsening symptoms whatsoever prior to appt with Dr. Cyndia Bent next week.     Patient stated understanding of all instructions; and was in agreement with this plan of care. The patient knows to call the clinic with any problems, questions or concerns.   Review/collaboration with Dr. Lindi Adie regarding all aspects of patient's visit today.   Total time spent with patient was 40 minutes;  with greater than 75 percent of that time spent in face to face counseling regarding patient's symptoms,  and coordination of care and follow up.  Disclaimer: This note was dictated with voice recognition software. Similar sounding words can inadvertently be transcribed and may not be corrected upon review.   Drue Second, NP 02/05/2015

## 2015-02-05 NOTE — Progress Notes (Signed)
Addendum:   Per Dr. Prescott Gum: Prescribed tapering dose pak starting at Prednisone 50 mg QD; and decreasing every 2 days till off.

## 2015-02-05 NOTE — Assessment & Plan Note (Signed)
K+ 3.4.  Pt already has K+ tabs at home; but occ forgets to take them.  Advised to take the K+ tabs QD as directed.

## 2015-02-05 NOTE — Assessment & Plan Note (Addendum)
Pt c/o increased SOB, nonproductive cough, and fatigue for past several days. Pt's right pleural drain discontinued and talc pleurodesis performed on 01/16/15.  Underwent thoracentesis last week removing approx 150 mls pleural fluid. Pt reports that she was only wearing her O2 at nite; but now has to wear the O2 24/7.  She has also noted increased heart rate recently; and decreased O2 sats to 90-93% on 2 liters. Denies any fever or chills.   On exam- diminished breath sounds to bilat bases. Slight SOB noted on exam.    After reviewing findings with Dr. Lindi Adie- obtained chest CT for further eval.   CT chest revealed:   FINDINGS: Mediastinum/Nodes: No axillary lymphadenopathy. Bilateral axillary clips are noted. No mediastinal or hilar lymphadenopathy. No pericardial fluid. Right perihilar mass seen on comparison PET-CT scan is no longer evident.  Lungs/Pleura: There is loculated fluid collections within the right hemi thorax. Rounded 2 cm fluid collection in the right lower lobe also appears to be loculated fluid (image 40, series 2). There is atelectasis within the right upper lobe and right lower lobe associated with the fluid. Within the right middle lobe there is a 5 mm nodule image 41. Difficult to place on comparison exam but of appears likely unchanged from 6 mm nodule. Peripheral nodule in the right lower lobe measuring 4 mm is not seen on prior due to large pleural effusion.  In the left lung, left lower lobe nodule measures 9 mm on image 22, series 4. This nodule is decreased in size from 15 mm on PET-CT scan. Left lower lobe nodule image 29 measuring 9 mm decreased from 11 mm.  Upper abdomen: No focal hepatic lesion limited view of the liver. Adrenal glands are normal.  Musculoskeletal: There is a fluid collection within the anterior right chest wall which may represent malshaped breast prosthetic. No aggressive osseous lesion.  IMPRESSION: 1. Decrease in right  hilar mass. 2. 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 3. Interval decrease in right pleural effusion. Persistent loculated right pleural effusion is present. 4. Right pulmonary nodules also appear decreased in size on the difficult to measure due to the effusion  Called to review findings  with Dr. Prescott Gum thoracic surgeon (on call for Dr. Gilford Raid)- who advised that pt be seen in Thoracic Surgery clinic next week for further eval and management of complaints. Dr. Prescott Gum feels that increased pulmonary irritation and SOB could very well be associated with recent instillation of talc.   Since pt obtained restaging CT today- will cancel chest CT scheduled for 02/12/15.    Pt advised to follow up with Dr. Cyndia Bent next week as planned.    Advised pt to go directly to the ED for worsening symptoms whatsoever prior to appt with Dr. Cyndia Bent next week.

## 2015-02-06 ENCOUNTER — Other Ambulatory Visit: Payer: Self-pay | Admitting: *Deleted

## 2015-02-06 ENCOUNTER — Telehealth: Payer: Self-pay | Admitting: Hematology and Oncology

## 2015-02-06 DIAGNOSIS — C50412 Malignant neoplasm of upper-outer quadrant of left female breast: Secondary | ICD-10-CM

## 2015-02-06 MED ORDER — POTASSIUM CHLORIDE CRYS ER 20 MEQ PO TBCR: EXTENDED_RELEASE_TABLET | ORAL | Status: DC

## 2015-02-06 NOTE — Telephone Encounter (Signed)
Per 07/02 POF cancel CT scan pt already had done... KJ

## 2015-02-06 NOTE — Assessment & Plan Note (Signed)
09/30/2012 :Stage III, triple negative, invasive ductal carcinoma of the left breast. Neoadjuvant chemotherapy following the surgery followed by adjuvant radiation with concurrent Xeloda. 3.9 cm size and 9/13 lymph nodes were positive with extracapsular extension. ER/PR negative HER-2 negative Ki-67 21% March 2016: CT chest revealed bilateral lung nodules biopsy-proven to be triple negative metastatic carcinoma , pleural effusion status post thoracentesis malignant cells on cytology, repeat thoracentesis 12/01/2014 and 12/12/2014 PET CT scan 11/27/2014 showed widespread metastatic disease to the thorax with multiple pulmonary nodules and masses and malignant right pleural effusion and right hilar and mediastinal malignant lymph nodes.  Current Treatment: Cycle 4 day 1 [place] (days 1,8 q 3 weeks) started 12/06/14 Shortness of breath: Related to malignant pleural effusion status post 3 thoracentesis. She is currently on oxygen by nasal cannula. She is seeing thoracic surgery today to see if she needs pleurodesis  Halaven toxicities: 1. Neutropenia: We started giving her Neulasta on day 8 of each cycle 2. Mild thrombocytopenia 3. Anemia grade 1  Radiology review: CTs 01/31/15 IMPRESSION: 1. Decrease in right hilar mass. 2. 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 3. Interval decrease in right pleural effusion. Persistent loculated right pleural effusion is present. 4. Right pulmonary nodules also appear decreased in size on the difficult to measure due to the effusion  RTC in 3 weeks for cycle 5.

## 2015-02-07 ENCOUNTER — Telehealth: Payer: Self-pay | Admitting: Hematology and Oncology

## 2015-02-07 ENCOUNTER — Ambulatory Visit (HOSPITAL_BASED_OUTPATIENT_CLINIC_OR_DEPARTMENT_OTHER): Payer: BLUE CROSS/BLUE SHIELD

## 2015-02-07 ENCOUNTER — Encounter: Payer: Self-pay | Admitting: Hematology and Oncology

## 2015-02-07 ENCOUNTER — Encounter: Payer: Self-pay | Admitting: Surgery

## 2015-02-07 ENCOUNTER — Ambulatory Visit (HOSPITAL_BASED_OUTPATIENT_CLINIC_OR_DEPARTMENT_OTHER): Payer: BLUE CROSS/BLUE SHIELD | Admitting: Hematology and Oncology

## 2015-02-07 ENCOUNTER — Ambulatory Visit (INDEPENDENT_AMBULATORY_CARE_PROVIDER_SITE_OTHER): Payer: BLUE CROSS/BLUE SHIELD | Admitting: Surgery

## 2015-02-07 ENCOUNTER — Ambulatory Visit
Admission: RE | Admit: 2015-02-07 | Discharge: 2015-02-07 | Disposition: A | Discharge status: Home / Self Care | Payer: BLUE CROSS/BLUE SHIELD | Source: Ambulatory Visit | Attending: Surgery | Admitting: Surgery

## 2015-02-07 ENCOUNTER — Other Ambulatory Visit (HOSPITAL_BASED_OUTPATIENT_CLINIC_OR_DEPARTMENT_OTHER): Payer: BLUE CROSS/BLUE SHIELD

## 2015-02-07 ENCOUNTER — Ambulatory Visit: Payer: BLUE CROSS/BLUE SHIELD

## 2015-02-07 VITALS — BP 143/93 | HR 98 | Resp 16 | Ht 64.0 in | Wt 188.0 lb

## 2015-02-07 VITALS — BP 132/66 | HR 110 | Temp 98.2°F | Resp 18 | Ht 64.0 in | Wt 188.9 lb

## 2015-02-07 DIAGNOSIS — C7801 Secondary malignant neoplasm of right lung: Secondary | ICD-10-CM

## 2015-02-07 DIAGNOSIS — Z5111 Encounter for antineoplastic chemotherapy: Secondary | ICD-10-CM

## 2015-02-07 DIAGNOSIS — J910 Malignant pleural effusion: Secondary | ICD-10-CM | POA: Diagnosis not present

## 2015-02-07 DIAGNOSIS — C7800 Secondary malignant neoplasm of unspecified lung: Secondary | ICD-10-CM

## 2015-02-07 DIAGNOSIS — D6481 Anemia due to antineoplastic chemotherapy: Secondary | ICD-10-CM

## 2015-02-07 DIAGNOSIS — D701 Agranulocytosis secondary to cancer chemotherapy: Secondary | ICD-10-CM

## 2015-02-07 DIAGNOSIS — C50412 Malignant neoplasm of upper-outer quadrant of left female breast: Secondary | ICD-10-CM

## 2015-02-07 DIAGNOSIS — Z853 Personal history of malignant neoplasm of breast: Secondary | ICD-10-CM | POA: Diagnosis not present

## 2015-02-07 DIAGNOSIS — Z95828 Presence of other vascular implants and grafts: Secondary | ICD-10-CM

## 2015-02-07 DIAGNOSIS — D6959 Other secondary thrombocytopenia: Secondary | ICD-10-CM

## 2015-02-07 DIAGNOSIS — C50912 Malignant neoplasm of unspecified site of left female breast: Principal | ICD-10-CM

## 2015-02-07 DIAGNOSIS — C50911 Malignant neoplasm of unspecified site of right female breast: Secondary | ICD-10-CM

## 2015-02-07 LAB — COMPREHENSIVE METABOLIC PANEL (CC13)
ALK PHOS: 106 U/L (ref 40–150)
ALT: 9 U/L (ref 0–55)
AST: 14 U/L (ref 5–34)
Albumin: 3.4 g/dL — ABNORMAL LOW (ref 3.5–5.0)
Anion Gap: 12 mEq/L — ABNORMAL HIGH (ref 3–11)
BUN: 16.8 mg/dL (ref 7.0–26.0)
CO2: 27 mEq/L (ref 22–29)
Calcium: 10.4 mg/dL (ref 8.4–10.4)
Chloride: 103 mEq/L (ref 98–109)
Creatinine: 0.8 mg/dL (ref 0.6–1.1)
EGFR: 90 mL/min/{1.73_m2} — ABNORMAL LOW (ref >90)
Glucose: 108 mg/dl (ref 70–140)
Potassium: 3.7 mEq/L (ref 3.5–5.1)
SODIUM: 141 meq/L (ref 136–145)
TOTAL PROTEIN: 6.8 g/dL (ref 6.4–8.3)
Total Bilirubin: 0.42 mg/dL (ref 0.20–1.20)

## 2015-02-07 LAB — CBC WITH DIFFERENTIAL/PLATELET
BASO%: 0.7 % (ref 0.0–2.0)
Basophils Absolute: 0.1 10*3/uL (ref 0.0–0.1)
EOS ABS: 0 10*3/uL (ref 0.0–0.5)
EOS%: 0.2 % (ref 0.0–7.0)
HCT: 31.6 % — ABNORMAL LOW (ref 34.8–46.6)
HGB: 10.1 g/dL — ABNORMAL LOW (ref 11.6–15.9)
LYMPH%: 14.1 % (ref 14.0–49.7)
MCH: 29.2 pg (ref 25.1–34.0)
MCHC: 32 g/dL (ref 31.5–36.0)
MCV: 91.4 fL (ref 79.5–101.0)
MONO#: 1.2 10*3/uL — ABNORMAL HIGH (ref 0.1–0.9)
MONO%: 9.6 % (ref 0.0–14.0)
NEUT%: 75.4 % (ref 38.4–76.8)
NEUTROS ABS: 9.5 10*3/uL — AB (ref 1.5–6.5)
Platelets: 217 10*3/uL (ref 145–400)
RBC: 3.46 10*6/uL — ABNORMAL LOW (ref 3.70–5.45)
RDW: 19.8 % — AB (ref 11.2–14.5)
WBC: 12.5 10*3/uL — AB (ref 3.9–10.3)
lymph#: 1.8 10*3/uL (ref 0.9–3.3)

## 2015-02-07 MED ORDER — SODIUM CHLORIDE 0.9 % IV SOLN
1.2000 mg/m2 | Freq: Once | INTRAVENOUS | Status: AC
  Administered 2015-02-07: 2.55 mg via INTRAVENOUS
  Filled 2015-02-07: qty 5.1

## 2015-02-07 MED ORDER — SODIUM CHLORIDE 0.9 % IJ SOLN
10.0000 mL | INTRAMUSCULAR | Status: DC | PRN
  Administered 2015-02-07: 10 mL
  Filled 2015-02-07: qty 10

## 2015-02-07 MED ORDER — SODIUM CHLORIDE 0.9 % IV SOLN
Freq: Once | INTRAVENOUS | Status: AC
  Administered 2015-02-07: 11:00:00 via INTRAVENOUS
  Filled 2015-02-07: qty 4

## 2015-02-07 MED ORDER — SODIUM CHLORIDE 0.9 % IJ SOLN
10.0000 mL | INTRAMUSCULAR | Status: DC | PRN
  Administered 2015-02-07: 10 mL via INTRAVENOUS
  Filled 2015-02-07: qty 10

## 2015-02-07 MED ORDER — HEPARIN SOD (PORK) LOCK FLUSH 100 UNIT/ML IV SOLN
500.0000 [IU] | Freq: Once | INTRAVENOUS | Status: AC | PRN
  Administered 2015-02-07: 500 [IU]
  Filled 2015-02-07: qty 5

## 2015-02-07 MED ORDER — SODIUM CHLORIDE 0.9 % IV SOLN
Freq: Once | INTRAVENOUS | Status: AC
Start: 2015-02-07 — End: 2015-02-07
  Administered 2015-02-07: 11:00:00 via INTRAVENOUS

## 2015-02-07 NOTE — Progress Notes (Signed)
Patient Care Team: Lucretia Kern, DO as PCP - General (Family Medicine)  DIAGNOSIS: Breast cancer of upper-outer quadrant of left female breast   Staging form: Breast, AJCC 7th Edition     Clinical: Stage IIIB (T4, N1, cM0) - Signed by Thea Silversmith, MD on 10/29/2012       Prognostic indicators: ER-, PR- HER2 -      Pathologic: No stage assigned - Unsigned       Prognostic indicators: ER-, PR- HER2 -    SUMMARY OF ONCOLOGIC HISTORY:   Breast cancer of upper-outer quadrant of left female breast   10/06/1986 Initial Diagnosis Right breast mastectomy, breast cancer diagnosed during pregnancy, no adjuvant treatment given   09/30/2012 Initial Biopsy Left breast biopsy done for left nipple retraction: Invasive ductal carcinoma grade 3, ER/PR negative, HER-2 negative, Ki-67 21% with grade 3 DCIS   10/12/2012 Breast MRI Left breast diffuse enhancement measuring 13 x 12.5 x 9.5 cm with diffuse skin thickening and dermal enhancement, multiple enlarged level I left axillary lymph nodes largest 2.4 cm   11/02/2012 - 06/06/2013 Neo-Adjuvant Chemotherapy Neoadjuvant FEC 4 followed by Taxol carbo 8 discontinued for neuropathy, Gemzar Carbo 2   09/15/2013 - 11/01/2013 Radiation Therapy Xeloda with adjuvant radiation   10/30/2014 Imaging Right hilar node 1.6 cm, subcarinal 1.2 cm, 1 cm additional right hilar, right lower lobe 3.2 cm, RUL 1 cm, and LDL of 1.3 cm, moderate pleural effusion, right liver 0.8 cm, 15 cm fluid collection left chest wall   10/31/2014 Initial Biopsy Lung biopsy right lower lobe: Carcinoma with Extensive necrosis, microscopic foci of nonnecrotic non-small cell, poorly differentiated carcinoma, metastatic disease likely ER 0%, PR 0%, HER-2 negative   12/01/2014 Procedure Second thoracentesis for malignant pleural effusion   12/06/2014 -  Chemotherapy Halaven day 1 day 8 every 3 weeks    CHIEF COMPLIANT: Halaven cycle 4 day 1  INTERVAL HISTORY: Christine [name] is a 59 year old with  above-mentioned history of metastatic breast cancer currently on palliative chemotherapy with Halaven. She had 3 cycles of chemotherapy and had CT scan of the chest showing good response with treatment with the decrease in the right hilar mass in addition to decreasing size of the left lung metastases decrease in the right pleural effusion as well as decrease in the right lung nodules.  She continues to have shortness of breath to exertion and uses nasal cannula oxygen. She is following with thoracic surgery. She underwent pleurodesis and now has loculated pleural effusions.  REVIEW OF SYSTEMS:   Constitutional: Denies fevers, chills or abnormal weight loss Eyes: Denies blurriness of vision Ears, nose, mouth, throat, and face: Denies mucositis or sore throat Respiratory: Shortness of breath or exertion Cardiovascular: Denies palpitation, chest discomfort or lower extremity swelling Gastrointestinal:  Denies nausea, heartburn or change in bowel habits Skin: Denies abnormal skin rashes Lymphatics: Denies new lymphadenopathy or easy bruising Neurological:Denies numbness, tingling or new weaknesses Behavioral/Psych: Mood is stable, no new changes   All other systems were reviewed with the patient and are negative.  I have reviewed the past medical history, past surgical history, social history and family history with the patient and they are unchanged from previous note.  ALLERGIES:  is allergic to sulfa antibiotics; sulfonamide derivatives; and tomato.  MEDICATIONS:  Current Outpatient Prescriptions  Medication Sig Dispense Refill  . Blood Glucose Monitoring Suppl (ONETOUCH VERIO FLEX SYSTEM) W/DEVICE KIT 1 Device by Does not apply route once. 1 kit 0  . cetirizine (ZYRTEC) 10 MG tablet  Take 10 mg by mouth daily.    . cholecalciferol (VITAMIN D) 1000 UNITS tablet Take 1,000 Units by mouth every morning.     . diltiazem (CARDIZEM CD) 120 MG 24 hr capsule Take 1 capsule (120 mg total) by mouth  every morning. 90 capsule 1  . esomeprazole (NEXIUM) 20 MG capsule Take 20 mg by mouth daily.     . fluticasone (FLONASE) 50 MCG/ACT nasal spray Place 2 sprays into both nostrils daily as needed for allergies or rhinitis.    Marland Kitchen glucose blood (ONETOUCH VERIO) test strip Use as instructed to check blood sugar once a day 100 each 1  . Lancets (ONETOUCH ULTRASOFT) lancets Use as instructed 100 each 1  . latanoprost (XALATAN) 0.005 % ophthalmic solution Place 1 drop into both eyes at bedtime.     Marland Kitchen levothyroxine (SYNTHROID, LEVOTHROID) 50 MCG tablet Take 1 tablet (50 mcg total) by mouth daily. 90 tablet 3  . lidocaine-prilocaine (EMLA) cream Apply to affected area once (Patient taking differently: Apply 1 application topically as needed (for chemo). Apply to affected area once) 30 g 3  . LORazepam (ATIVAN) 0.5 MG tablet Take 1 tablet (0.5 mg total) by mouth every 6 (six) hours as needed (Nausea or vomiting). 30 tablet 0  . metFORMIN (GLUCOPHAGE) 500 MG tablet Take $RemoveBef'1000mg'VPLBFaXDri$  (2 tabs) in the morning and $RemoveBef'500mg'QrjoAeVzdu$  (1 tab) in the evening with meals 135 tablet 3  . naproxen sodium (ANAPROX) 220 MG tablet Take 440 mg by mouth daily as needed (pain).    . NON FORMULARY Apply 1 application topically as needed (FUNGIFOAM - Applies to feet for athlete's foot.).     Marland Kitchen ondansetron (ZOFRAN) 8 MG tablet Take 1 tablet (8 mg total) by mouth 2 (two) times daily. Start the day after chemo for 2 days. Then take as needed for nausea or vomiting. 30 tablet 1  . oxyCODONE (OXY IR/ROXICODONE) 5 MG immediate release tablet Take 1 tablet (5 mg total) by mouth every 4 (four) hours as needed for moderate pain. 30 tablet 0  . potassium chloride SA (KLOR-CON M20) 20 MEQ tablet TAKE 1 TABLET (20 MEQ TOTAL) BY MOUTH 2 (TWO) TIMES DAILY. 60 tablet 0  . predniSONE (DELTASONE) 10 MG tablet 50 mg QD x 2 days, then 40 mg QD x 2 days, then 30 mg QD x 2 days, then 20 mg QD x 2 days, then 10 mg QD x 2 days. 30 tablet 0  . prochlorperazine (COMPAZINE)  10 MG tablet Take 1 tablet (10 mg total) by mouth every 6 (six) hours as needed (Nausea or vomiting). 30 tablet 1  . Tetrahydrozoline HCl (VISINE OP) Apply 1-2 drops to eye daily as needed (itchy eyes.).     No current facility-administered medications for this visit.   Facility-Administered Medications Ordered in Other Visits  Medication Dose Route Frequency Provider Last Rate Last Dose  . sodium chloride 0.9 % injection 10 mL  10 mL Intravenous PRN Nicholas Lose, MD   10 mL at 02/07/15 0930    PHYSICAL EXAMINATION: ECOG PERFORMANCE STATUS: 2 - Symptomatic, <50% confined to bed  There were no vitals filed for this visit. There were no vitals filed for this visit.  GENERAL:alert, no distress and comfortable SKIN: skin color, texture, turgor are normal, no rashes or significant lesions EYES: normal, Conjunctiva are pink and non-injected, sclera clear OROPHARYNX:no exudate, no erythema and lips, buccal mucosa, and tongue normal  NECK: supple, thyroid normal size, non-tender, without nodularity LYMPH:  no palpable  lymphadenopathy in the cervical, axillary or inguinal LUNGS: Diminished breath sounds in the right lung base HEART: regular rate & rhythm and no murmurs and no lower extremity edema ABDOMEN:abdomen soft, non-tender and normal bowel sounds Musculoskeletal:no cyanosis of digits and no clubbing  NEURO: alert & oriented x 3 with fluent speech, no focal motor/sensory deficits   LABORATORY DATA:  I have reviewed the data as listed   Chemistry      Component Value Date/Time   NA 141 01/31/2015 1047   NA 140 12/03/2014 0733   K 3.4* 01/31/2015 1047   K 3.7 12/03/2014 0733   CL 101 12/03/2014 0733   CL 101 01/25/2013 0952   CO2 27 01/31/2015 1047   CO2 29 12/03/2014 0733   BUN 8.3 01/31/2015 1047   BUN 14 12/03/2014 0733   CREATININE 0.8 01/31/2015 1047   CREATININE 1.10* 12/03/2014 0733      Component Value Date/Time   CALCIUM 9.5 01/31/2015 1047   CALCIUM 8.5*  12/03/2014 0733   ALKPHOS 136 01/31/2015 1047   ALKPHOS 76 12/02/2014 0532   AST 15 01/31/2015 1047   AST 28 12/02/2014 0532   ALT 8 01/31/2015 1047   ALT 19 12/02/2014 0532   BILITOT 0.57 01/31/2015 1047   BILITOT 0.5 12/02/2014 0532       Lab Results  Component Value Date   WBC 12.5* 02/07/2015   HGB 10.1* 02/07/2015   HCT 31.6* 02/07/2015   MCV 91.4 02/07/2015   PLT 217 02/07/2015   NEUTROABS 9.5* 02/07/2015   ASSESSMENT & PLAN:  Breast cancer of upper-outer quadrant of left female breast 09/30/2012 :Stage III, triple negative, invasive ductal carcinoma of the left breast. Neoadjuvant chemotherapy following the surgery followed by adjuvant radiation with concurrent Xeloda. 3.9 cm size and 9/13 lymph nodes were positive with extracapsular extension. ER/PR negative HER-2 negative Ki-67 21% March 2016: CT chest revealed bilateral lung nodules biopsy-proven to be triple negative metastatic carcinoma , pleural effusion status post thoracentesis malignant cells on cytology, repeat thoracentesis 12/01/2014 and 12/12/2014 PET CT scan 11/27/2014 showed widespread metastatic disease to the thorax with multiple pulmonary nodules and masses and malignant right pleural effusion and right hilar and mediastinal malignant lymph nodes.  Current Treatment: Cycle 4 day 1 Halaven (days 1,8 q 3 weeks) started 12/06/14 Shortness of breath: Related to malignant pleural effusion status post 3 thoracentesis. She is currently on oxygen by nasal cannula. She is seeing thoracic surgery today to see if she needs pleurodesis  Halaven toxicities: 1. Neutropenia: We started giving her Neulasta on day 8 of each cycle 2. Mild thrombocytopenia 3. Anemia grade 1 4. Reduced the dosage of Halaven from cycle 4 because of low WBC counts on day 8  Radiology review: CTs 01/31/15 IMPRESSION: 1. Decrease in right hilar mass. 2. 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 3. Interval decrease in  right pleural effusion. Persistent loculated right pleural effusion is present. 4. Right pulmonary nodules also appear decreased in size on the difficult to measure due to the effusion  RTC in 3 weeks for cycle 5.    No orders of the defined types were placed in this encounter.   The patient has a good understanding of the overall plan. she agrees with it. she will call with any problems that may develop before the next visit here.   Rulon Eisenmenger, MD

## 2015-02-07 NOTE — Patient Instructions (Signed)

## 2015-02-07 NOTE — Telephone Encounter (Signed)
Appointments made and avs will be printed in chemo  anne

## 2015-02-07 NOTE — Patient Instructions (Signed)
[facility] Discharge Instructions for Patients Receiving Chemotherapy  Today you received the following chemotherapy agents: Halaven  To help prevent nausea and vomiting after your treatment, we encourage you to take your nausea medication as directed.    If you develop nausea and vomiting that is not controlled by your nausea medication, call the clinic.   BELOW ARE SYMPTOMS THAT SHOULD BE REPORTED IMMEDIATELY:  *FEVER GREATER THAN 100.5 F  *CHILLS WITH OR WITHOUT FEVER  NAUSEA AND VOMITING THAT IS NOT CONTROLLED WITH YOUR NAUSEA MEDICATION  *UNUSUAL SHORTNESS OF BREATH  *UNUSUAL BRUISING OR BLEEDING  TENDERNESS IN MOUTH AND THROAT WITH OR WITHOUT PRESENCE OF ULCERS  *URINARY PROBLEMS  *BOWEL PROBLEMS  UNUSUAL RASH Items with * indicate a potential emergency and should be followed up as soon as possible.  Feel free to call the clinic you have any questions or concerns. The clinic phone number is [phone].  Please show the CHEMO ALERT CARD at check-in to the Emergency Department and triage nurse.   

## 2015-02-08 ENCOUNTER — Encounter: Payer: Self-pay | Admitting: Surgery

## 2015-02-08 NOTE — Progress Notes (Signed)
HPI:  Patient returns for routine postoperative follow-up having undergone insertion of a right pleurX catheter for recurrent malignant pleural effusion on 01/02/2015 followed by talc pleurodesis and removal of the catheter on 01/16/2015. After talc pleurodesis she developed a loculated right pleural effusion and had an attempt at right thoracentesis by IR but only 150 cc of fluid could be removed. She had a follow up CT of the chest on 01/31/2015 that showed a decrease in the right hilar mass and left pulmonary nodular mets. There was also a decrease in the size of the loculated right pleural effusion. She says that her breathing is improved. She is still using oxygen when she ambulates but her sats are always in the 90's.     Current Outpatient Prescriptions  Medication Sig Dispense Refill  . Blood Glucose Monitoring Suppl (ONETOUCH VERIO FLEX SYSTEM) W/DEVICE KIT 1 Device by Does not apply route once. 1 kit 0  . cetirizine (ZYRTEC) 10 MG tablet Take 10 mg by mouth daily.    . cholecalciferol (VITAMIN D) 1000 UNITS tablet Take 1,000 Units by mouth every morning.     . diltiazem (CARDIZEM CD) 120 MG 24 hr capsule Take 1 capsule (120 mg total) by mouth every morning. 90 capsule 1  . esomeprazole (NEXIUM) 20 MG capsule Take 20 mg by mouth daily.     . fluticasone (FLONASE) 50 MCG/ACT nasal spray Place 2 sprays into both nostrils daily as needed for allergies or rhinitis.    Marland Kitchen glucose blood (ONETOUCH VERIO) test strip Use as instructed to check blood sugar once a day 100 each 1  . Lancets (ONETOUCH ULTRASOFT) lancets Use as instructed 100 each 1  . latanoprost (XALATAN) 0.005 % ophthalmic solution Place 1 drop into both eyes at bedtime.     Marland Kitchen levothyroxine (SYNTHROID, LEVOTHROID) 50 MCG tablet Take 1 tablet (50 mcg total) by mouth daily. 90 tablet 3  . lidocaine-prilocaine (EMLA) cream Apply to affected area once (Patient taking differently: Apply 1 application topically as needed (for chemo).  Apply to affected area once) 30 g 3  . LORazepam (ATIVAN) 0.5 MG tablet Take 1 tablet (0.5 mg total) by mouth every 6 (six) hours as needed (Nausea or vomiting). 30 tablet 0  . metFORMIN (GLUCOPHAGE) 500 MG tablet Take 1044m (2 tabs) in the morning and 5060m(1 tab) in the evening with meals 135 tablet 3  . naproxen sodium (ANAPROX) 220 MG tablet Take 440 mg by mouth daily as needed (pain).    . NON FORMULARY Apply 1 application topically as needed (FUNGIFOAM - Applies to feet for athlete's foot.).     . Marland Kitchenndansetron (ZOFRAN) 8 MG tablet Take 1 tablet (8 mg total) by mouth 2 (two) times daily. Start the day after chemo for 2 days. Then take as needed for nausea or vomiting. 30 tablet 1  . oxyCODONE (OXY IR/ROXICODONE) 5 MG immediate release tablet Take 1 tablet (5 mg total) by mouth every 4 (four) hours as needed for moderate pain. 30 tablet 0  . potassium chloride SA (KLOR-CON M20) 20 MEQ tablet TAKE 1 TABLET (20 MEQ TOTAL) BY MOUTH 2 (TWO) TIMES DAILY. 60 tablet 0  . predniSONE (DELTASONE) 10 MG tablet 50 mg QD x 2 days, then 40 mg QD x 2 days, then 30 mg QD x 2 days, then 20 mg QD x 2 days, then 10 mg QD x 2 days. 30 tablet 0  . prochlorperazine (COMPAZINE) 10 MG tablet Take 1 tablet (  10 mg total) by mouth every 6 (six) hours as needed (Nausea or vomiting). 30 tablet 1  . Tetrahydrozoline HCl (VISINE OP) Apply 1-2 drops to eye daily as needed (itchy eyes.).     No current facility-administered medications for this visit.    Physical Exam: BP 143/93 mmHg  Pulse 98  Resp 16  Ht _0  (1.626 m)  Wt 188 lb (85.276 kg)  BMI 32.25 kg/m2  SpO2 98% She looks well Lung exam shows decreased breath sounds at the right base.  Diagnostic Tests:  CLINICAL DATA: Malignancy .  EXAM: CHEST 2 VIEW  COMPARISON: CT 01/31/2015 .  FINDINGS: Power port catheter in good anatomic position. Mediastinum and hilar structures stable. Stable cardiomegaly . No pulmonary venous congestion. Loculated  right pleural fluid collection again noted. Previously identified pulmonary nodules best seen on recent CT. Surgical clips noted over the axilla.  IMPRESSION: 1. Loculated right pleural fluid collection again noted. 2. Previously identified pulmonary nodules best seen by recent CT. 3. Power port catheter in good anatomic position.   Electronically Signed  By: Marcello Moores Register  On: 02/07/2015 15:33  Impression:  She still has some loculated right pleural effusion which is not uncommon after talc pleurodesis through a PleurX catheter but I think it is less on her current CXR and CT and this may continue to decrease over time. She is symptomatically better.  Plan:  She will return in 1 month for a follow up CXR. She is continuing chemotherapy per Dr. Lindi Adie.   Gaye Pollack, MD [facility] [phone]

## 2015-02-11 ENCOUNTER — Inpatient Hospital Stay (HOSPITAL_BASED_OUTPATIENT_CLINIC_OR_DEPARTMENT_OTHER)
Admission: EM | Admit: 2015-02-11 | Discharge: 2015-02-13 | DRG: 392 | Disposition: A | Discharge status: Home / Self Care | Payer: BLUE CROSS/BLUE SHIELD | Attending: Internal Medicine | Admitting: Internal Medicine

## 2015-02-11 ENCOUNTER — Encounter (HOSPITAL_BASED_OUTPATIENT_CLINIC_OR_DEPARTMENT_OTHER): Payer: Self-pay

## 2015-02-11 ENCOUNTER — Emergency Department (HOSPITAL_BASED_OUTPATIENT_CLINIC_OR_DEPARTMENT_OTHER): Payer: BLUE CROSS/BLUE SHIELD

## 2015-02-11 DIAGNOSIS — D509 Iron deficiency anemia, unspecified: Secondary | ICD-10-CM | POA: Diagnosis present

## 2015-02-11 DIAGNOSIS — E860 Dehydration: Secondary | ICD-10-CM | POA: Diagnosis present

## 2015-02-11 DIAGNOSIS — Z8249 Family history of ischemic heart disease and other diseases of the circulatory system: Secondary | ICD-10-CM

## 2015-02-11 DIAGNOSIS — Z6832 Body mass index (BMI) 32.0-32.9, adult: Secondary | ICD-10-CM

## 2015-02-11 DIAGNOSIS — H409 Unspecified glaucoma: Secondary | ICD-10-CM | POA: Diagnosis present

## 2015-02-11 DIAGNOSIS — Z86010 Personal history of colonic polyps: Secondary | ICD-10-CM | POA: Diagnosis not present

## 2015-02-11 DIAGNOSIS — C50919 Malignant neoplasm of unspecified site of unspecified female breast: Secondary | ICD-10-CM | POA: Diagnosis not present

## 2015-02-11 DIAGNOSIS — Z79899 Other long term (current) drug therapy: Secondary | ICD-10-CM

## 2015-02-11 DIAGNOSIS — E538 Deficiency of other specified B group vitamins: Secondary | ICD-10-CM | POA: Diagnosis present

## 2015-02-11 DIAGNOSIS — E876 Hypokalemia: Secondary | ICD-10-CM | POA: Diagnosis present

## 2015-02-11 DIAGNOSIS — E119 Type 2 diabetes mellitus without complications: Secondary | ICD-10-CM | POA: Diagnosis present

## 2015-02-11 DIAGNOSIS — Z800 Family history of malignant neoplasm of digestive organs: Secondary | ICD-10-CM

## 2015-02-11 DIAGNOSIS — J9611 Chronic respiratory failure with hypoxia: Secondary | ICD-10-CM

## 2015-02-11 DIAGNOSIS — C7800 Secondary malignant neoplasm of unspecified lung: Secondary | ICD-10-CM | POA: Diagnosis present

## 2015-02-11 DIAGNOSIS — J309 Allergic rhinitis, unspecified: Secondary | ICD-10-CM | POA: Diagnosis present

## 2015-02-11 DIAGNOSIS — E039 Hypothyroidism, unspecified: Secondary | ICD-10-CM | POA: Diagnosis present

## 2015-02-11 DIAGNOSIS — J9610 Chronic respiratory failure, unspecified whether with hypoxia or hypercapnia: Secondary | ICD-10-CM | POA: Diagnosis present

## 2015-02-11 DIAGNOSIS — I10 Essential (primary) hypertension: Secondary | ICD-10-CM | POA: Diagnosis present

## 2015-02-11 DIAGNOSIS — R112 Nausea with vomiting, unspecified: Secondary | ICD-10-CM

## 2015-02-11 DIAGNOSIS — R110 Nausea: Secondary | ICD-10-CM | POA: Diagnosis not present

## 2015-02-11 DIAGNOSIS — Z9981 Dependence on supplemental oxygen: Secondary | ICD-10-CM

## 2015-02-11 DIAGNOSIS — D649 Anemia, unspecified: Secondary | ICD-10-CM | POA: Diagnosis not present

## 2015-02-11 DIAGNOSIS — C50412 Malignant neoplasm of upper-outer quadrant of left female breast: Secondary | ICD-10-CM | POA: Diagnosis present

## 2015-02-11 DIAGNOSIS — J910 Malignant pleural effusion: Secondary | ICD-10-CM | POA: Diagnosis present

## 2015-02-11 DIAGNOSIS — Z803 Family history of malignant neoplasm of breast: Secondary | ICD-10-CM

## 2015-02-11 DIAGNOSIS — D508 Other iron deficiency anemias: Secondary | ICD-10-CM | POA: Diagnosis not present

## 2015-02-11 DIAGNOSIS — A09 Infectious gastroenteritis and colitis, unspecified: Secondary | ICD-10-CM | POA: Diagnosis present

## 2015-02-11 DIAGNOSIS — R000 Tachycardia, unspecified: Secondary | ICD-10-CM | POA: Diagnosis present

## 2015-02-11 DIAGNOSIS — F418 Other specified anxiety disorders: Secondary | ICD-10-CM | POA: Diagnosis present

## 2015-02-11 DIAGNOSIS — R1110 Vomiting, unspecified: Secondary | ICD-10-CM

## 2015-02-11 DIAGNOSIS — Z9011 Acquired absence of right breast and nipple: Secondary | ICD-10-CM | POA: Diagnosis present

## 2015-02-11 DIAGNOSIS — K219 Gastro-esophageal reflux disease without esophagitis: Secondary | ICD-10-CM | POA: Diagnosis present

## 2015-02-11 HISTORY — DX: Chronic respiratory failure, unspecified whether with hypoxia or hypercapnia: J96.10

## 2015-02-11 LAB — COMPREHENSIVE METABOLIC PANEL
ALBUMIN: 3.7 g/dL (ref 3.5–5.0)
ALT: 12 U/L — ABNORMAL LOW (ref 14–54)
ANION GAP: 13 (ref 5–15)
AST: 17 U/L (ref 15–41)
Alkaline Phosphatase: 83 U/L (ref 38–126)
BUN: 18 mg/dL (ref 6–20)
CO2: 31 mmol/L (ref 22–32)
CREATININE: 0.75 mg/dL (ref 0.44–1.00)
Calcium: 9.5 mg/dL (ref 8.9–10.3)
Chloride: 95 mmol/L — ABNORMAL LOW (ref 101–111)
GFR calc Af Amer: >60 mL/min (ref >60)
Glucose, Bld: 179 mg/dL — ABNORMAL HIGH (ref 65–99)
Potassium: 2.9 mmol/L — ABNORMAL LOW (ref 3.5–5.1)
Sodium: 139 mmol/L (ref 135–145)
Total Bilirubin: 0.6 mg/dL (ref 0.3–1.2)
Total Protein: 6.6 g/dL (ref 6.5–8.1)

## 2015-02-11 LAB — CBC WITH DIFFERENTIAL/PLATELET
BASOS PCT: 0 % (ref 0–1)
Basophils Absolute: 0 10*3/uL (ref 0.0–0.1)
Eosinophils Absolute: 0 10*3/uL (ref 0.0–0.7)
Eosinophils Relative: 0 % (ref 0–5)
HCT: 33.1 % — ABNORMAL LOW (ref 36.0–46.0)
Hemoglobin: 10.4 g/dL — ABNORMAL LOW (ref 12.0–15.0)
Lymphocytes Relative: 14 % (ref 12–46)
Lymphs Abs: 0.9 10*3/uL (ref 0.7–4.0)
MCH: 29.3 pg (ref 26.0–34.0)
MCHC: 31.4 g/dL (ref 30.0–36.0)
MCV: 93.2 fL (ref 78.0–100.0)
Monocytes Absolute: 0.2 10*3/uL (ref 0.1–1.0)
Monocytes Relative: 4 % (ref 3–12)
NEUTROS PCT: 82 % — AB (ref 43–77)
Neutro Abs: 5.2 10*3/uL (ref 1.7–7.7)
PLATELETS: 203 10*3/uL (ref 150–400)
RBC: 3.55 MIL/uL — ABNORMAL LOW (ref 3.87–5.11)
RDW: 17.3 % — ABNORMAL HIGH (ref 11.5–15.5)
WBC: 6.3 10*3/uL (ref 4.0–10.5)

## 2015-02-11 LAB — URINALYSIS, ROUTINE W REFLEX MICROSCOPIC
BILIRUBIN URINE: NEGATIVE
Glucose, UA: NEGATIVE mg/dL
Hgb urine dipstick: NEGATIVE
KETONES UR: NEGATIVE mg/dL
LEUKOCYTES UA: NEGATIVE
Nitrite: NEGATIVE
PROTEIN: NEGATIVE mg/dL
Specific Gravity, Urine: 1.012 (ref 1.005–1.030)
UROBILINOGEN UA: 0.2 mg/dL (ref 0.0–1.0)
pH: 6 (ref 5.0–8.0)

## 2015-02-11 LAB — GLUCOSE, CAPILLARY: GLUCOSE-CAPILLARY: 109 mg/dL — AB (ref 65–99)

## 2015-02-11 LAB — CBG MONITORING, ED: GLUCOSE-CAPILLARY: 133 mg/dL — AB (ref 65–99)

## 2015-02-11 LAB — LIPASE, BLOOD: Lipase: 12 U/L — ABNORMAL LOW (ref 22–51)

## 2015-02-11 MED ORDER — PROMETHAZINE HCL 25 MG/ML IJ SOLN
25.0000 mg | Freq: Once | INTRAMUSCULAR | Status: AC
  Administered 2015-02-11: 25 mg via INTRAVENOUS
  Filled 2015-02-11: qty 1

## 2015-02-11 MED ORDER — OXYCODONE HCL 5 MG PO TABS: 5.0000 mg | ORAL_TABLET | ORAL | Status: DC | PRN

## 2015-02-11 MED ORDER — SODIUM CHLORIDE 0.9 % IJ SOLN
10.0000 mL | INTRAMUSCULAR | Status: DC | PRN
  Administered 2015-02-11: 20 mL
  Administered 2015-02-12 – 2015-02-13 (×5): 10 mL
  Filled 2015-02-11 (×6): qty 40

## 2015-02-11 MED ORDER — FLUTICASONE PROPIONATE 50 MCG/ACT NA SUSP
2.0000 | Freq: Every day | NASAL | Status: DC | PRN
  Filled 2015-02-11: qty 16

## 2015-02-11 MED ORDER — SODIUM CHLORIDE 0.9 % IV BOLUS (SEPSIS)
1000.0000 mL | Freq: Once | INTRAVENOUS | Status: AC
  Administered 2015-02-11: 1000 mL via INTRAVENOUS

## 2015-02-11 MED ORDER — MORPHINE SULFATE 2 MG/ML IJ SOLN
2.0000 mg | INTRAMUSCULAR | Status: DC | PRN
  Administered 2015-02-12: 2 mg via INTRAVENOUS
  Filled 2015-02-11: qty 1

## 2015-02-11 MED ORDER — POTASSIUM CHLORIDE 10 MEQ/100ML IV SOLN
10.0000 meq | INTRAVENOUS | Status: AC
  Administered 2015-02-11 (×3): 10 meq via INTRAVENOUS
  Filled 2015-02-11 (×3): qty 100

## 2015-02-11 MED ORDER — LORATADINE 10 MG PO TABS
10.0000 mg | ORAL_TABLET | Freq: Every day | ORAL | Status: DC
  Administered 2015-02-12 – 2015-02-13 (×2): 10 mg via ORAL
  Filled 2015-02-11 (×2): qty 1

## 2015-02-11 MED ORDER — ALBUTEROL SULFATE (2.5 MG/3ML) 0.083% IN NEBU: 2.5000 mg | INHALATION_SOLUTION | RESPIRATORY_TRACT | Status: DC | PRN

## 2015-02-11 MED ORDER — INSULIN ASPART 100 UNIT/ML ~~LOC~~ SOLN
0.0000 [IU] | Freq: Three times a day (TID) | SUBCUTANEOUS | Status: DC
  Administered 2015-02-12: 2 [IU] via SUBCUTANEOUS
  Administered 2015-02-12 (×2): 1 [IU] via SUBCUTANEOUS

## 2015-02-11 MED ORDER — SODIUM CHLORIDE 0.9 % IJ SOLN
3.0000 mL | Freq: Two times a day (BID) | INTRAMUSCULAR | Status: DC
  Administered 2015-02-12: 3 mL via INTRAVENOUS

## 2015-02-11 MED ORDER — PANTOPRAZOLE SODIUM 40 MG IV SOLR
40.0000 mg | Freq: Every day | INTRAVENOUS | Status: DC
  Administered 2015-02-11 – 2015-02-12 (×2): 40 mg via INTRAVENOUS
  Filled 2015-02-11 (×4): qty 40

## 2015-02-11 MED ORDER — POTASSIUM CHLORIDE 10 MEQ/100ML IV SOLN
10.0000 meq | Freq: Once | INTRAVENOUS | Status: AC
  Administered 2015-02-11: 10 meq via INTRAVENOUS
  Filled 2015-02-11: qty 100

## 2015-02-11 MED ORDER — LEVOTHYROXINE SODIUM 100 MCG IV SOLR
25.0000 ug | Freq: Every day | INTRAVENOUS | Status: DC
  Administered 2015-02-12 – 2015-02-13 (×2): 25 ug via INTRAVENOUS
  Filled 2015-02-11 (×2): qty 5

## 2015-02-11 MED ORDER — FLEET ENEMA 7-19 GM/118ML RE ENEM: 1.0000 | ENEMA | Freq: Once | RECTAL | Status: AC | PRN

## 2015-02-11 MED ORDER — ONDANSETRON HCL 40 MG/20ML IJ SOLN
8.0000 mg | Freq: Four times a day (QID) | INTRAMUSCULAR | Status: DC | PRN
  Filled 2015-02-11: qty 4

## 2015-02-11 MED ORDER — ONDANSETRON HCL 4 MG/2ML IJ SOLN
4.0000 mg | Freq: Once | INTRAMUSCULAR | Status: AC
  Administered 2015-02-11: 4 mg via INTRAVENOUS
  Filled 2015-02-11: qty 2

## 2015-02-11 MED ORDER — ONDANSETRON HCL 4 MG/2ML IJ SOLN
INTRAMUSCULAR | Status: AC
  Filled 2015-02-11: qty 2

## 2015-02-11 MED ORDER — SORBITOL 70 % SOLN
30.0000 mL | Freq: Every day | Status: DC | PRN
  Filled 2015-02-11: qty 30

## 2015-02-11 MED ORDER — ALUM & MAG HYDROXIDE-SIMETH 200-200-20 MG/5ML PO SUSP: 30.0000 mL | Freq: Four times a day (QID) | ORAL | Status: DC | PRN

## 2015-02-11 MED ORDER — METOCLOPRAMIDE HCL 5 MG/ML IJ SOLN
10.0000 mg | Freq: Once | INTRAMUSCULAR | Status: AC
Start: 2015-02-11 — End: 2015-02-11
  Administered 2015-02-11: 10 mg via INTRAVENOUS
  Filled 2015-02-11: qty 2

## 2015-02-11 MED ORDER — ONDANSETRON HCL 4 MG PO TABS: 8.0000 mg | ORAL_TABLET | Freq: Four times a day (QID) | ORAL | Status: DC | PRN

## 2015-02-11 MED ORDER — SODIUM CHLORIDE 0.9 % IV SOLN
INTRAVENOUS | Status: DC
  Administered 2015-02-11 – 2015-02-12 (×2): via INTRAVENOUS
  Filled 2015-02-11 (×5): qty 1000

## 2015-02-11 MED ORDER — METOPROLOL TARTRATE 1 MG/ML IV SOLN
2.5000 mg | Freq: Three times a day (TID) | INTRAVENOUS | Status: DC
  Administered 2015-02-11 – 2015-02-13 (×5): 2.5 mg via INTRAVENOUS
  Filled 2015-02-11 (×8): qty 5

## 2015-02-11 MED ORDER — LORAZEPAM 2 MG/ML IJ SOLN
1.0000 mg | Freq: Once | INTRAMUSCULAR | Status: AC
  Administered 2015-02-11: 1 mg via INTRAVENOUS
  Filled 2015-02-11: qty 1

## 2015-02-11 MED ORDER — TETRAHYDROZOLINE HCL 0.05 % OP SOLN
1.0000 [drp] | Freq: Every day | OPHTHALMIC | Status: DC | PRN
  Filled 2015-02-11: qty 15

## 2015-02-11 MED ORDER — ACETAMINOPHEN 650 MG RE SUPP: 650.0000 mg | Freq: Four times a day (QID) | RECTAL | Status: DC | PRN

## 2015-02-11 MED ORDER — LORAZEPAM 0.5 MG PO TABS: 0.5000 mg | ORAL_TABLET | Freq: Four times a day (QID) | ORAL | Status: DC | PRN

## 2015-02-11 MED ORDER — ENOXAPARIN SODIUM 40 MG/0.4ML ~~LOC~~ SOLN
40.0000 mg | Freq: Every day | SUBCUTANEOUS | Status: DC
  Administered 2015-02-11 – 2015-02-12 (×2): 40 mg via SUBCUTANEOUS
  Filled 2015-02-11 (×4): qty 0.4

## 2015-02-11 MED ORDER — PROCHLORPERAZINE EDISYLATE 5 MG/ML IJ SOLN: 10.0000 mg | Freq: Four times a day (QID) | INTRAMUSCULAR | Status: DC | PRN

## 2015-02-11 MED ORDER — POLYETHYLENE GLYCOL 3350 17 G PO PACK: 17.0000 g | PACK | Freq: Every day | ORAL | Status: DC | PRN

## 2015-02-11 MED ORDER — ACETAMINOPHEN 325 MG PO TABS: 650.0000 mg | ORAL_TABLET | Freq: Four times a day (QID) | ORAL | Status: DC | PRN

## 2015-02-11 MED ORDER — LATANOPROST 0.005 % OP SOLN
1.0000 [drp] | Freq: Every day | OPHTHALMIC | Status: DC
  Administered 2015-02-11 – 2015-02-12 (×2): 1 [drp] via OPHTHALMIC
  Filled 2015-02-11 (×2): qty 2.5

## 2015-02-11 MED ORDER — POTASSIUM CHLORIDE 10 MEQ/100ML IV SOLN
10.0000 meq | INTRAVENOUS | Status: AC
Start: 2015-02-11 — End: 2015-02-12
  Administered 2015-02-11 – 2015-02-12 (×4): 10 meq via INTRAVENOUS
  Filled 2015-02-11 (×4): qty 100

## 2015-02-11 NOTE — ED Notes (Signed)
Patient c/o continued nausea.  Family remains at bedside.

## 2015-02-11 NOTE — ED Provider Notes (Signed)
CSN: [id number]     Arrival date & time 02/11/15  9940 History   First MD Initiated Contact with Patient 02/11/15 0930     Chief Complaint  Patient presents with  . Emesis     (Consider location/radiation/quality/duration/timing/severity/associated sxs/prior Treatment) Patient is a 59 y.o. female presenting with vomiting. The history is provided by the patient.  Emesis Severity:  Moderate Associated symptoms: abdominal pain and diarrhea   Associated symptoms: no headaches    patient has had nausea and vomiting the last couple days. Initially had some diarrhea that has then slowed down. No fevers. She has a decreased appetite. She is on chemotherapy for breast cancer metastatic to her lungs and her last treatment was on Wednesday. No fevers. No dysuria. No chills. She has a history of pleural effusions that required pleurodesis she states her breathing has improved. She has some dull left upper abdominal pain. She has not had nausea to severe with previous chemotherapy rounds.  Past Medical History  Diagnosis Date  . ALLERGIC RHINITIS   . GERD (gastroesophageal reflux disease)   . Hypertension   . Hypothyroidism   . Hx of colonic polyps   . Wears glasses   . Radiation 09/15/13-11/01/13    Left chest wall/PAB/scar/supraclavicular fossa  . SYNCOPE 05/15/2010    Qualifier: Diagnosis of  By: Lovell Sheehan MD, Balinda Quails CERVICAL STRAIN, WITH RADICULOPATHY 10/01/2007    Qualifier: Diagnosis of  By: Lovell Sheehan MD, Balinda Quails   . Rosacea 05/31/2007    Qualifier: Diagnosis of  By: Lovell Sheehan MD, Balinda Quails COLONIC POLYPS, HX OF 05/31/2007    Qualifier: Diagnosis of  By: Lovell Sheehan MD, Balinda Quails   . Lymph edema L arm from breast ca tx 12/22/2013  . H/O blood clots     hx of small blood clots in both legs  . Diabetes mellitus without complication     type 2 - on Metformin  . Anxiety   . Neuropathy due to chemotherapeutic drug   . Breast cancer 5, age 36    right  . Breast cancer 09/30/12    left, ER/PR -,  Her 2 -  . Secondary lung cancer 2016  . Anemia     during chemo  . History of IBS   . Glaucoma   . Dry skin   . Lymphedema of arm     left arm, post mastectomy  . Shortness of breath dyspnea     with exertion (since onset of pleural effusion) Wears O2 2 L as needed   Past Surgical History  Procedure Laterality Date  . Tonsillectomy    . Caesarean section      x 1  . Mastectomy  04/1987    right side  . Abdominal hysterectomy  06/1988    fibroids  . Portacath placement Right 10/28/2012    Procedure: PORT PLACEMENT;  Surgeon: Clovis Pu. Cornett, MD;  Location: [facility];  Service: General;  Laterality: Right;  Right Subclavian Vein  . Mastectomy modified radical Left 07/19/2013    Procedure: MASTECTOMY MODIFIED RADICAL;  Surgeon: Maisie Fus A. Cornett, MD;  Location: [facility];  Service: General;  Laterality: Left;  . Port-a-cath removal Right 07/19/2013    Procedure: REMOVAL PORT-A-CATH;  Surgeon: Clovis Pu. Cornett, MD;  Location: [facility];  Service: General;  Laterality: Right;  . Cesarean section    . Breast surgery Left 07/19/13    MRM  . Breast surgery Right  mastectomy  . Port-a-cath insertion  12/03/2014  . Colonoscopy    . Chest tube insertion Right 01/02/2015    Procedure: INSERTION PLEURAL DRAINAGE CATHETER RIGHT CHEST;  Surgeon: Gaye Pollack, MD;  Location: [facility];  Service: Thoracic;  Laterality: Right;  . Removal of pleural drainage catheter Right 01/16/2015    Procedure: REMOVAL OF PLEURAL DRAINAGE CATHETER;  Surgeon: Gaye Pollack, MD;  Location: [facility];  Service: Thoracic;  Laterality: Right;  . Talc pleurodesis Right 01/16/2015    Procedure: Pietro Cassis;  Surgeon: Gaye Pollack, MD;  Location: [facility];  Service: Thoracic;  Laterality: Right;   Family History  Problem Relation Age of Onset  . Colon cancer Mother 19    family hx of colon ca 1st degree relative <60  . Hypertension Mother   . Coronary artery  disease Father     family hx of CAD female 1st degree relative <50  . Stomach cancer Neg Hx   . Rectal cancer Neg Hx   . Esophageal cancer Neg Hx   . Breast cancer Maternal Grandmother 80  . Diabetes Paternal Grandmother   . Breast cancer Cousin 27    maternal cousin  . Coronary artery disease Maternal Grandfather   . Breast cancer Sister     paternal half sister; died in her 48s   History  Substance Use Topics  . Smoking status: Never Smoker   . Smokeless tobacco: Never Used  . Alcohol Use: No   OB History    Obstetric Comments   meanrche age 64, G75 , p 1, menopause [age], no HRT     Review of Systems  Constitutional: Negative for activity change and appetite change.  Eyes: Negative for pain.  Respiratory: Negative for chest tightness and shortness of breath.   Cardiovascular: Negative for chest pain and leg swelling.  Gastrointestinal: Positive for nausea, vomiting, abdominal pain and diarrhea.  Genitourinary: Negative for flank pain.  Musculoskeletal: Negative for back pain and neck stiffness.  Skin: Negative for rash.  Neurological: Negative for weakness, numbness and headaches.  Psychiatric/Behavioral: Negative for behavioral problems.      Allergies  Sulfa antibiotics; Sulfonamide derivatives; and Tomato  Home Medications   Prior to Admission medications   Medication Sig Start Date End Date Taking? Authorizing Provider  Blood Glucose Monitoring Suppl (ONETOUCH VERIO FLEX SYSTEM) W/DEVICE KIT 1 Device by Does not apply route once. 01/03/15   Lucretia Kern, DO  cetirizine (ZYRTEC) 10 MG tablet Take 10 mg by mouth daily.    Historical Provider, MD  cholecalciferol (VITAMIN D) 1000 UNITS tablet Take 1,000 Units by mouth every morning.     Historical Provider, MD  diltiazem (CARDIZEM CD) 120 MG 24 hr capsule Take 1 capsule (120 mg total) by mouth every morning. 01/04/15   Lucretia Kern, DO  esomeprazole (NEXIUM) 20 MG capsule Take 20 mg by mouth daily.     Historical  Provider, MD  fluticasone (FLONASE) 50 MCG/ACT nasal spray Place 2 sprays into both nostrils daily as needed for allergies or rhinitis.    Historical Provider, MD  glucose blood (ONETOUCH VERIO) test strip Use as instructed to check blood sugar once a day 01/03/15   Lucretia Kern, DO  Lancets [facility] ULTRASOFT) lancets Use as instructed 01/03/15   Lucretia Kern, DO  latanoprost (XALATAN) 0.005 % ophthalmic solution Place 1 drop into both eyes at bedtime.     Historical Provider, MD  levothyroxine (SYNTHROID, LEVOTHROID) 50 MCG tablet Take  1 tablet (50 mcg total) by mouth daily. 11/07/14   Lucretia Kern, DO  lidocaine-prilocaine (EMLA) cream Apply to affected area once Patient taking differently: Apply 1 application topically as needed (for chemo). Apply to affected area once 11/16/14   Nicholas Lose, MD  LORazepam (ATIVAN) 0.5 MG tablet Take 1 tablet (0.5 mg total) by mouth every 6 (six) hours as needed (Nausea or vomiting). 01/17/15   Maryanna Shape, NP  metFORMIN (GLUCOPHAGE) 500 MG tablet Take $RemoveBef'1000mg'ArKXfoLQVe$  (2 tabs) in the morning and $RemoveBef'500mg'aopzPkmKnc$  (1 tab) in the evening with meals 12/07/14   Lucretia Kern, DO  naproxen sodium (ANAPROX) 220 MG tablet Take 440 mg by mouth daily as needed (pain).    Historical Provider, MD  NON FORMULARY Apply 1 application topically as needed (FUNGIFOAM - Applies to feet for athlete's foot.).     Historical Provider, MD  ondansetron (ZOFRAN) 8 MG tablet Take 1 tablet (8 mg total) by mouth 2 (two) times daily. Start the day after chemo for 2 days. Then take as needed for nausea or vomiting. 11/16/14   Nicholas Lose, MD  oxyCODONE (OXY IR/ROXICODONE) 5 MG immediate release tablet Take 1 tablet (5 mg total) by mouth every 4 (four) hours as needed for moderate pain. 01/02/15   Gaye Pollack, MD  potassium chloride SA (KLOR-CON M20) 20 MEQ tablet TAKE 1 TABLET (20 MEQ TOTAL) BY MOUTH 2 (TWO) TIMES DAILY. 02/06/15   Nicholas Lose, MD  predniSONE (DELTASONE) 10 MG tablet 50 mg QD x 2 days, then 40 mg QD  x 2 days, then 30 mg QD x 2 days, then 20 mg QD x 2 days, then 10 mg QD x 2 days. 01/31/15   Susanne Borders, NP  prochlorperazine (COMPAZINE) 10 MG tablet Take 1 tablet (10 mg total) by mouth every 6 (six) hours as needed (Nausea or vomiting). 11/16/14   Nicholas Lose, MD  Tetrahydrozoline HCl (VISINE OP) Apply 1-2 drops to eye daily as needed (itchy eyes.).    Historical Provider, MD   BP 132/84 mmHg  Pulse 98  Temp(Src) 97.8 F (36.6 C) (Oral)  Resp 18  Ht $R'5\' 4"'hu$  (1.626 m)  Wt 188 lb (85.276 kg)  BMI 32.25 kg/m2  SpO2 98% Physical Exam  Constitutional: She appears well-developed.  HENT:  Head: Atraumatic.  Neck: Neck supple.  Cardiovascular: Normal rate and regular rhythm.   Pulmonary/Chest: Effort normal and breath sounds normal.  Abdominal: There is tenderness.  Minimal left upper abdominal tenderness without rebound or guarding. No hernias palpated.  Musculoskeletal: Normal range of motion.  Neurological: She is alert.  Skin: Skin is warm.    ED Course  Procedures (including critical care time) Labs Review Labs Reviewed  COMPREHENSIVE METABOLIC PANEL - Abnormal; Notable for the following:    Potassium 2.9 (*)    Chloride 95 (*)    Glucose, Bld 179 (*)    ALT 12 (*)    All other components within normal limits  LIPASE, BLOOD - Abnormal; Notable for the following:    Lipase 12 (*)    All other components within normal limits  CBC WITH DIFFERENTIAL/PLATELET - Abnormal; Notable for the following:    RBC 3.55 (*)    Hemoglobin 10.4 (*)    HCT 33.1 (*)    RDW 17.3 (*)    Neutrophils Relative % 82 (*)    All other components within normal limits  URINALYSIS, ROUTINE W REFLEX MICROSCOPIC (NOT AT [facility])    Imaging Review  Dg Abd Acute W/chest  02/11/2015   CLINICAL DATA:  Nausea.  Abdominal pain.  Diarrhea.  Emesis.  EXAM: DG ABDOMEN ACUTE W/ 1V CHEST  COMPARISON:  02/07/2015  FINDINGS: Power injectable Port-A-Cath observed, tip in the inferior portion of the right atrium.   Stable right pleural effusion with passive atelectasis. Bilateral axillary clips.  Mild cardiomegaly.  No significant abnormal air-fluid levels. No dilated bowel. Vascular calcifications in the anatomic pelvis.  IMPRESSION: 1. Unremarkable bowel gas pattern. 2. Stable right pleural effusion. 3. Stable cardiomegaly. 4. Atherosclerotic aortic arch. 5. The right internal jugular power injectable Port-A-Cath tip is positioned inferiorly in the right atrium.   Electronically Signed   By: Van Clines M.D.   On: 02/11/2015 15:01     EKG Interpretation None      MDM   Final diagnoses:  Intractable vomiting with nausea, vomiting of unspecified type  Hypokalemia    Patient with nausea and vomiting. Mild upper abdominal pain. She is on chemotherapy last dose was on Wednesday. Counts overall reassuring but does have mild hypokalemia. Has continued to vomit in the ER and has not been improved with Ativan, Phenergan, or Zofran. Will admit to internal medicine.    Davonna Belling, MD 02/11/15 [phone]

## 2015-02-11 NOTE — H&P (Signed)
[facility] History and Physical  Christine [name]:[id number] DOB: 26-Dec-1955 DOA: 02/11/2015  Referring physician: Dr. Jeanell Sparrow PCP: Lucretia Kern., DO   Chief Complaint: Nausea and vomiting  HPI: Christine [name] is a 59 y.o. 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 Patient denies any fevers, no chills, no chest pain, no shortness of breath, no dysuria, no cough, no melanoma, no hematemesis, no hematochezia. Patient does endorse some soft stools and some constipation. Patient also endorses generalized weakness decreased oral intake on the day of admission. Patient states that the night prior to admission 8 some [employer]. Patient states usually after chemotherapy is able to take antiemetics without any problems. Patient denies any recent travel or significant change in her diet. Patient was seen in the ED acute abdominal series which was done was negative for any acute abnormalities. Comprehensive metabolic profile.had a potassium of 2.9 chloride of 95 with a ALT of 12 otherwise was within normal limits. Lipase level was 12. CBC had a hemoglobin of 10.4 otherwise was within normal limits. [facility] were called to admit the patient for further evaluation and management.    Review of Systems: As per HPI otherwise negative.  Constitutional:  No weight loss, night sweats, Fevers, chills, fatigue.  HEENT:  No headaches, Difficulty swallowing,Tooth/dental problems,Sore throat,  No sneezing, itching, ear ache, nasal congestion, post nasal drip,  Cardio-vascular:  No chest pain, Orthopnea, PND, swelling in lower extremities, anasarca,  dizziness, palpitations  GI:  No heartburn, indigestion, abdominal pain, nausea, vomiting, diarrhea, change in bowel habits, loss of appetite  Resp:  No shortness of breath with exertion or at rest. No excess mucus, no productive cough, No non-productive cough, No coughing up of blood.No change in color of mucus.No wheezing.No chest wall deformity  Skin:  no rash or lesions.  GU:  no dysuria, change in color of urine, no urgency or frequency. No flank pain.  Musculoskeletal:  No joint pain or swelling. No decreased range of motion. No back pain.  Psych:  No change in mood or affect. No depression or anxiety. No memory loss.   Past Medical History  Diagnosis Date  . ALLERGIC RHINITIS   . GERD (gastroesophageal reflux disease)   . Hypertension   . Hypothyroidism   . Hx of colonic polyps   . Wears glasses   . Radiation 09/15/13-11/01/13    Left chest wall/PAB/scar/supraclavicular fossa  . SYNCOPE 05/15/2010    Qualifier: Diagnosis of  By: Arnoldo Morale MD, [place], WITH RADICULOPATHY 10/01/2007    Qualifier: Diagnosis of  By: Arnoldo Morale MD, [place] 05/31/2007    Qualifier: Diagnosis of  By: Arnoldo Morale MD, [place], HX OF 05/31/2007    Qualifier: Diagnosis of  By: Arnoldo Morale MD, Balinda Quails   . Lymph edema L arm from breast ca tx 12/22/2013  . H/O blood clots     hx of small blood clots in both legs  . Diabetes mellitus without complication     type 2 - on Metformin  . Anxiety   . Neuropathy due to chemotherapeutic drug   .  Breast cancer 1, age 89    right  . Breast cancer 09/30/12    left, ER/PR -, Her 2 -  . Secondary lung cancer 2016  . Anemia     during chemo  . History of IBS   . Glaucoma   . Dry skin   . Lymphedema of arm     left arm, post mastectomy  . Shortness of breath dyspnea     with exertion (since onset of pleural effusion) Wears O2 2 L as needed  . Chronic respiratory failure; On Home oxygen 2L since 12/03/2014 02/11/2015   Past  Surgical History  Procedure Laterality Date  . Tonsillectomy    . Caesarean section      x 1  . Mastectomy  04/1987    right side  . Abdominal hysterectomy  06/1988    fibroids  . Portacath placement Right 10/28/2012    Procedure: PORT PLACEMENT;  Surgeon: Joyice Faster. Cornett, MD;  Location: [place];  Service: General;  Laterality: Right;  Right Subclavian Vein  . Mastectomy modified radical Left 07/19/2013    Procedure: MASTECTOMY MODIFIED RADICAL;  Surgeon: Marcello Moores A. Cornett, MD;  Location: [place];  Service: General;  Laterality: Left;  . Port-a-cath removal Right 07/19/2013    Procedure: REMOVAL PORT-A-CATH;  Surgeon: Joyice Faster. Cornett, MD;  Location: [place];  Service: General;  Laterality: Right;  . Cesarean section    . Breast surgery Left 07/19/13    MRM  . Breast surgery Right     mastectomy  . Port-a-cath insertion  12/03/2014  . Colonoscopy    . Chest tube insertion Right 01/02/2015    Procedure: INSERTION PLEURAL DRAINAGE CATHETER RIGHT CHEST;  Surgeon: Gaye Pollack, MD;  Location: [place] [facility];  Service: Thoracic;  Laterality: Right;  . Removal of pleural drainage catheter Right 01/16/2015    Procedure: REMOVAL OF PLEURAL DRAINAGE CATHETER;  Surgeon: Gaye Pollack, MD;  Location: [place];  Service: Thoracic;  Laterality: Right;  . Talc pleurodesis Right 01/16/2015    Procedure: Pietro Cassis;  Surgeon: Gaye Pollack, MD;  Location: [facility];  Service: Thoracic;  Laterality: Right;   Social History:  reports that she has never smoked. She has never used smokeless tobacco. She reports that she does not drink alcohol or use illicit drugs.  Allergies  Allergen Reactions  . Sulfa Antibiotics Hives, Itching and Swelling  . Sulfonamide Derivatives Hives, Itching and Swelling  . Tomato Hives and Itching    Family History  Problem Relation Age of Onset  . Colon cancer Mother 75    family hx of colon ca 1st degree relative <60  .  Hypertension Mother   . Coronary artery disease Father     family hx of CAD female 1st degree relative <50  . Stomach cancer Neg Hx   . Rectal cancer Neg Hx   . Esophageal cancer Neg Hx   . Breast cancer Maternal Grandmother 80  . Diabetes Paternal Grandmother   . Breast cancer Cousin 61    maternal cousin  . Coronary artery disease Maternal Grandfather   . Breast cancer Sister     paternal half sister; died in her 21s    Prior to Admission medications   Medication Sig Start Date End Date Taking? Authorizing Provider  Blood Glucose Monitoring Suppl (ONETOUCH VERIO FLEX SYSTEM) W/DEVICE KIT 1 Device by Does not apply route once. 01/03/15   Lucretia Kern, DO  cetirizine (  ZYRTEC) 10 MG tablet Take 10 mg by mouth daily.    Historical Provider, MD  cholecalciferol (VITAMIN D) 1000 UNITS tablet Take 1,000 Units by mouth every morning.     Historical Provider, MD  diltiazem (CARDIZEM CD) 120 MG 24 hr capsule Take 1 capsule (120 mg total) by mouth every morning. 01/04/15   Lucretia Kern, DO  esomeprazole (NEXIUM) 20 MG capsule Take 20 mg by mouth daily.     Historical Provider, MD  fluticasone (FLONASE) 50 MCG/ACT nasal spray Place 2 sprays into both nostrils daily as needed for allergies or rhinitis.    Historical Provider, MD  glucose blood (ONETOUCH VERIO) test strip Use as instructed to check blood sugar once a day 01/03/15   Lucretia Kern, DO  Lancets [facility] ULTRASOFT) lancets Use as instructed 01/03/15   Lucretia Kern, DO  latanoprost (XALATAN) 0.005 % ophthalmic solution Place 1 drop into both eyes at bedtime.     Historical Provider, MD  levothyroxine (SYNTHROID, LEVOTHROID) 50 MCG tablet Take 1 tablet (50 mcg total) by mouth daily. 11/07/14   Lucretia Kern, DO  lidocaine-prilocaine (EMLA) cream Apply to affected area once Patient taking differently: Apply 1 application topically as needed (for chemo). Apply to affected area once 11/16/14   Nicholas Lose, MD  LORazepam (ATIVAN) 0.5 MG tablet Take 1  tablet (0.5 mg total) by mouth every 6 (six) hours as needed (Nausea or vomiting). 01/17/15   Maryanna Shape, NP  metFORMIN (GLUCOPHAGE) 500 MG tablet Take 1081m (2 tabs) in the morning and 5014m(1 tab) in the evening with meals 12/07/14   HaLucretia KernDO  naproxen sodium (ANAPROX) 220 MG tablet Take 440 mg by mouth daily as needed (pain).    Historical Provider, MD  NON FORMULARY Apply 1 application topically as needed (FUNGIFOAM - Applies to feet for athlete's foot.).     Historical Provider, MD  ondansetron (ZOFRAN) 8 MG tablet Take 1 tablet (8 mg total) by mouth 2 (two) times daily. Start the day after chemo for 2 days. Then take as needed for nausea or vomiting. 11/16/14   ViNicholas LoseMD  oxyCODONE (OXY IR/ROXICODONE) 5 MG immediate release tablet Take 1 tablet (5 mg total) by mouth every 4 (four) hours as needed for moderate pain. 01/02/15   BrGaye PollackMD  potassium chloride SA (KLOR-CON M20) 20 MEQ tablet TAKE 1 TABLET (20 MEQ TOTAL) BY MOUTH 2 (TWO) TIMES DAILY. 02/06/15   ViNicholas LoseMD  predniSONE (DELTASONE) 10 MG tablet 50 mg QD x 2 days, then 40 mg QD x 2 days, then 30 mg QD x 2 days, then 20 mg QD x 2 days, then 10 mg QD x 2 days. 01/31/15   CySusanne BordersNP  prochlorperazine (COMPAZINE) 10 MG tablet Take 1 tablet (10 mg total) by mouth every 6 (six) hours as needed (Nausea or vomiting). 11/16/14   ViNicholas LoseMD  Tetrahydrozoline HCl (VISINE OP) Apply 1-2 drops to eye daily as needed (itchy eyes.).    Historical Provider, MD   Physical Exam: Filed Vitals:   02/11/15 1830 02/11/15 1900 02/11/15 1930 02/11/15 2055  BP: 139/86 134/78 128/75 126/57  Pulse: 106 107 110 108  Temp:    98.7 F (37.1 C)  TempSrc:    Oral  Resp:    18  Height:    _0  (1.626 m)  Weight:    84.414 kg (186 lb 1.6 oz)  SpO2: 100% 100% 100%  100%    Wt Readings from Last 3 Encounters:  02/11/15 84.414 kg (186 lb 1.6 oz)  02/07/15 85.276 kg (188 lb)  02/07/15 85.684 kg (188 lb 14.4 oz)     General:   well-developed well-nourished laying on bed in no acute cardiopulmonary distress. Speaking in full sentences.  Eyes: PERRLA, EOMI, normal lids, irises & conjunctiva ENT: grossly normal hearing, lips & tongue.dry mucous membranes  Neck: no LAD, masses or thyromegaly Cardiovascular: Tachycardia No LE edema. Respiratory: CTA bilaterally, no w/r/r. Normal respiratory effort. Abdomen: soft, nt, mildly distended, positive bowel sounds, no rebound, no guarding  Skin: no rash or induration seen on limited exam Musculoskeletal: grossly normal tone BUE/BLE Psychiatric: grossly normal mood and affect, speech fluent and appropriate Neurologic:  alert and oriented 3. CN 2 - 12 are grossly intact. No focal deficits.           Labs on Admission:  Basic Metabolic Panel:  Recent Labs Lab 02/07/15 0919 02/11/15 0950  NA 141 139  K 3.7 2.9*  CL  --  95*  CO2 27 31  GLUCOSE 108 179*  BUN 16.8 18  CREATININE 0.8 0.75  CALCIUM 10.4 9.5   Liver Function Tests:  Recent Labs Lab 02/07/15 0919 02/11/15 0950  AST 14 17  ALT 9 12*  ALKPHOS 106 83  BILITOT 0.42 0.6  PROT 6.8 6.6  ALBUMIN 3.4* 3.7    Recent Labs Lab 02/11/15 0950  LIPASE 12*   No results for input(s): AMMONIA in the last 168 hours. CBC:  Recent Labs Lab 02/07/15 0918 02/11/15 0950  WBC 12.5* 6.3  NEUTROABS 9.5* 5.2  HGB 10.1* 10.4*  HCT 31.6* 33.1*  MCV 91.4 93.2  PLT 217 203   Cardiac Enzymes: No results for input(s): CKTOTAL, CKMB, CKMBINDEX, TROPONINI in the last 168 hours.  BNP (last 3 results)  Recent Labs  10/30/14 1300  BNP 55.3    ProBNP (last 3 results) No results for input(s): PROBNP in the last 8760 hours.  CBG:  Recent Labs Lab 02/11/15 1944  GLUCAP 133*    Radiological Exams on Admission: Dg Abd Acute W/chest  02/11/2015   CLINICAL DATA:  Nausea.  Abdominal pain.  Diarrhea.  Emesis.  EXAM: DG ABDOMEN ACUTE W/ 1V CHEST  COMPARISON:  02/07/2015  FINDINGS: Power  injectable Port-A-Cath observed, tip in the inferior portion of the right atrium.  Stable right pleural effusion with passive atelectasis. Bilateral axillary clips.  Mild cardiomegaly.  No significant abnormal air-fluid levels. No dilated bowel. Vascular calcifications in the anatomic pelvis.  IMPRESSION: 1. Unremarkable bowel gas pattern. 2. Stable right pleural effusion. 3. Stable cardiomegaly. 4. Atherosclerotic aortic arch. 5. The right internal jugular power injectable Port-A-Cath tip is positioned inferiorly in the right atrium.   Electronically Signed   By: Van Clines M.D.   On: 02/11/2015 15:01    EKG: none  Assessment/Plan Principal Problem:   Nausea and vomiting in adult Active Problems:   Hypokalemia   Dehydration   Tachycardia   Hypothyroidism   Morbid obesity   ANXIETY DEPRESSION   Essential hypertension   Allergic rhinitis   GERD   Breast cancer of upper-outer quadrant of left female breast   B12 deficiency   Malignant pleural effusion   Recurrent right pleural effusion   Chronic respiratory failure; On Home oxygen 2L since 12/03/2014   Carcinoma of breast metastatic to lung   Nausea & vomiting   Anemia   #1 nausea and vomiting Likely secondary to  chemotherapy-induced versus a viral gastroenteritis. Patient states ate [employer] the night prior to admission was 1 and whether this may be associated with his symptoms. CBC has a normal white count. Acute abdominal series was negative for any acute abnormalities. Will admit patient to telemetry. Will place on IV fluids, antiemetics, supportive care. We'll place on ice chips overnight and started on a clear liquid diet in the morning. Follow.  #2 hypokalemia Likely secondary to GI losses secondary to problem #1. Check a magnesium level. Replete.  #3 dehydration IV fluids.  #4 sinus tachycardia Likely secondary to volume depletion nausea or vomiting and inability to take her oral Cardizem. Check a EKG. Will place on  IV fluids. Place on IV Lopressor. Follow.  #5 anxiety/depression Continue home regimen Ativan.  #6 hypothyroidism Check a TSH. Place on IV Synthroid. Once tolerating oral intake will change to oral Synthroid.  #7 gastroesophageal reflux disease PPI.  #8 chronic respiratory failure on home O2.   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  #9 metastatic breast cancer/Recurrent malignant pleural effusion status post right Pleurx catheter 01/02/2015/talc pleurodesis and removal of catheter 01/16/2015/loculated right pleural effusion Patient currently on chronic home O2. Will inform oncology of patient's admission via Epic. Continue oxygen. Follow.  #10 hypertension  Will place on IV Lopressor.  #11 diabetes mellitus type 2 Check a hemoglobin A1c. Place on sliding scale insulin.  #12 anemia Likely anemia of chronic disease. Check an anemia panel. Follow H&H.  #13 prophylaxis PPI for GI prophylaxis. Lovenox for DVT prophylaxis.     Code Status: full DVT Prophylaxis: Lovenox Family Communication: Updated patient, husband, sister, aunt at bedside.  Disposition Plan: admit to telemetry.  Time spent: 9 minutes  THOMPSON,DANIEL M.D. [facility] Pager [phone]

## 2015-02-11 NOTE — ED Notes (Signed)
Family informed that patient will be going to [facility] and we are waiting on a bed assignment.

## 2015-02-11 NOTE — ED Notes (Signed)
Patient c/o abdominal cramping.  Patient resting in bed with eyes closed; respirations even and unlabored.

## 2015-02-11 NOTE — ED Notes (Signed)
Pt since 0400 with vomiting, had constipation previously.  On chemo last tx wed.

## 2015-02-11 NOTE — Progress Notes (Signed)
Patient with history of breast cancer, S/P pleurex catheter. No history of dyspnea. Present with nausea, vomitng, and diarrhea. Last chemo last Wednesday. Hypokalemia. Accepted to telemetry [facility].  Niel Hummer., Md.

## 2015-02-11 NOTE — ED Notes (Signed)
Patient c/o continued nausea.  Dr. Alvino Chapel aware and orders received.

## 2015-02-11 NOTE — ED Notes (Signed)
[facility] here, preparing to transfer, no changes.

## 2015-02-11 NOTE — ED Notes (Signed)
Report received. Pt lethargic, awake and interactive, c/o stomach upset and generally weak, denies pain or nausea at this time, NAD, calm, speech clear, pending arrival of [facility], family x2 at [facility], IVF & KCL infusing, VSS.

## 2015-02-11 NOTE — ED Provider Notes (Signed)
Discussed with Dr. Tyrell Antonio and accepted in transfer.  She states plan trnasfer to [facility] to tele. Additional runs of potassium ordered.   Pattricia Boss, MD 02/11/15 [phone]

## 2015-02-11 NOTE — ED Notes (Signed)
Pt placed on auto vitals; q30.

## 2015-02-11 NOTE — ED Notes (Signed)
Patient vomited on her shirt; shirt changed by Iona Beard, EMT and assisted to bathroom to collect urine specimen.

## 2015-02-12 ENCOUNTER — Other Ambulatory Visit (HOSPITAL_COMMUNITY): Payer: BLUE CROSS/BLUE SHIELD

## 2015-02-12 DIAGNOSIS — C50412 Malignant neoplasm of upper-outer quadrant of left female breast: Secondary | ICD-10-CM

## 2015-02-12 DIAGNOSIS — R110 Nausea: Secondary | ICD-10-CM

## 2015-02-12 DIAGNOSIS — I10 Essential (primary) hypertension: Secondary | ICD-10-CM

## 2015-02-12 DIAGNOSIS — E860 Dehydration: Secondary | ICD-10-CM

## 2015-02-12 DIAGNOSIS — R000 Tachycardia, unspecified: Secondary | ICD-10-CM

## 2015-02-12 LAB — COMPREHENSIVE METABOLIC PANEL
ALT: 11 U/L — AB (ref 14–54)
ALT: 11 U/L — ABNORMAL LOW (ref 14–54)
AST: 18 U/L (ref 15–41)
AST: 17 U/L (ref 15–41)
Albumin: 3.2 g/dL — ABNORMAL LOW (ref 3.5–5.0)
Albumin: 3.4 g/dL — ABNORMAL LOW (ref 3.5–5.0)
Alkaline Phosphatase: 75 U/L (ref 38–126)
Alkaline Phosphatase: 78 U/L (ref 38–126)
Anion gap: 8 (ref 5–15)
Anion gap: 8 (ref 5–15)
BUN: 7 mg/dL (ref 6–20)
BUN: 6 mg/dL (ref 6–20)
CO2: 30 mmol/L (ref 22–32)
CO2: 30 mmol/L (ref 22–32)
Calcium: 8.2 mg/dL — ABNORMAL LOW (ref 8.9–10.3)
Calcium: 8.2 mg/dL — ABNORMAL LOW (ref 8.9–10.3)
Chloride: 95 mmol/L — ABNORMAL LOW (ref 101–111)
Chloride: 94 mmol/L — ABNORMAL LOW (ref 101–111)
Creatinine, Ser: 0.6 mg/dL (ref 0.44–1.00)
Creatinine, Ser: 0.51 mg/dL (ref 0.44–1.00)
GFR calc non Af Amer: >60 mL/min (ref >60)
GFR calc non Af Amer: >60 mL/min (ref >60)
GLUCOSE: 127 mg/dL — AB (ref 65–99)
Glucose, Bld: 121 mg/dL — ABNORMAL HIGH (ref 65–99)
POTASSIUM: 3.6 mmol/L (ref 3.5–5.1)
Potassium: 3.4 mmol/L — ABNORMAL LOW (ref 3.5–5.1)
Sodium: 133 mmol/L — ABNORMAL LOW (ref 135–145)
Sodium: 132 mmol/L — ABNORMAL LOW (ref 135–145)
TOTAL PROTEIN: 6.3 g/dL — AB (ref 6.5–8.1)
Total Bilirubin: 1 mg/dL (ref 0.3–1.2)
Total Bilirubin: 1.1 mg/dL (ref 0.3–1.2)
Total Protein: 6.1 g/dL — ABNORMAL LOW (ref 6.5–8.1)

## 2015-02-12 LAB — IRON AND TIBC
IRON: 17 ug/dL — AB (ref 28–170)
SATURATION RATIOS: 5 % — AB (ref 10.4–31.8)
TIBC: 315 ug/dL (ref 250–450)
UIBC: 298 ug/dL

## 2015-02-12 LAB — TROPONIN I
TROPONIN I: 0.03 ng/mL (ref <0.031)
TROPONIN I: 0.03 ng/mL (ref <0.031)
Troponin I: 0.03 ng/mL (ref <0.031)

## 2015-02-12 LAB — CBC
HEMATOCRIT: 29.6 % — AB (ref 36.0–46.0)
Hemoglobin: 9.3 g/dL — ABNORMAL LOW (ref 12.0–15.0)
MCH: 29.2 pg (ref 26.0–34.0)
MCHC: 31.4 g/dL (ref 30.0–36.0)
MCV: 92.8 fL (ref 78.0–100.0)
Platelets: 193 10*3/uL (ref 150–400)
RBC: 3.19 MIL/uL — ABNORMAL LOW (ref 3.87–5.11)
RDW: 17.9 % — AB (ref 11.5–15.5)
WBC: 7.9 10*3/uL (ref 4.0–10.5)

## 2015-02-12 LAB — RETICULOCYTES
RBC.: 3.18 MIL/uL — AB (ref 3.87–5.11)
RETIC CT PCT: 1 % (ref 0.4–3.1)
Retic Count, Absolute: 31.8 10*3/uL (ref 19.0–186.0)

## 2015-02-12 LAB — BASIC METABOLIC PANEL
Anion gap: 8 (ref 5–15)
BUN: 8 mg/dL (ref 6–20)
CO2: 32 mmol/L (ref 22–32)
CREATININE: 0.61 mg/dL (ref 0.44–1.00)
Calcium: 8.3 mg/dL — ABNORMAL LOW (ref 8.9–10.3)
Chloride: 96 mmol/L — ABNORMAL LOW (ref 101–111)
GFR calc Af Amer: >60 mL/min (ref >60)
Glucose, Bld: 122 mg/dL — ABNORMAL HIGH (ref 65–99)
Potassium: 3.3 mmol/L — ABNORMAL LOW (ref 3.5–5.1)
Sodium: 136 mmol/L (ref 135–145)

## 2015-02-12 LAB — FERRITIN: FERRITIN: 891 ng/mL — AB (ref 11–307)

## 2015-02-12 LAB — TSH: TSH: 0.756 u[IU]/mL (ref 0.350–4.500)

## 2015-02-12 LAB — MAGNESIUM
MAGNESIUM: 1.1 mg/dL — AB (ref 1.7–2.4)
Magnesium: 1.6 mg/dL — ABNORMAL LOW (ref 1.7–2.4)
Magnesium: 2.6 mg/dL — ABNORMAL HIGH (ref 1.7–2.4)

## 2015-02-12 LAB — VITAMIN B12: Vitamin B-12: 2252 pg/mL — ABNORMAL HIGH (ref 180–914)

## 2015-02-12 LAB — GLUCOSE, CAPILLARY
GLUCOSE-CAPILLARY: 151 mg/dL — AB (ref 65–99)
GLUCOSE-CAPILLARY: 141 mg/dL — AB (ref 65–99)
Glucose-Capillary: 134 mg/dL — ABNORMAL HIGH (ref 65–99)
Glucose-Capillary: 139 mg/dL — ABNORMAL HIGH (ref 65–99)

## 2015-02-12 LAB — FOLATE: FOLATE: 6 ng/mL (ref >5.9)

## 2015-02-12 MED ORDER — MAGNESIUM SULFATE 4 GM/100ML IV SOLN
4.0000 g | Freq: Once | INTRAVENOUS | Status: AC
  Administered 2015-02-12: 4 g via INTRAVENOUS
  Filled 2015-02-12: qty 100

## 2015-02-12 MED ORDER — FERROUS SULFATE 325 (65 FE) MG PO TABS
325.0000 mg | ORAL_TABLET | Freq: Every day | ORAL | Status: DC
  Administered 2015-02-13: 325 mg via ORAL
  Filled 2015-02-12 (×2): qty 1

## 2015-02-12 NOTE — Progress Notes (Signed)
[facility] PROGRESS NOTE  Christine [name] BTD:[id number] DOB: [birth year] DOA: 02/11/2015 PCP: Lucretia Kern., DO  Assessment/Plan: #1 nausea and vomiting, suspected infectious gastroenteritis Likely secondary to infectious gastroenteritis. Patient recalls eating fried chicken just prior to onset of symptoms. Acute abdominal series was negative for any acute abnormalities. - Feels much improved and is tolerating clears, cont to advance as tolerated  #2 hypokalemia - Secondary to GI losses - Replaced - Replaced Mg (see below)  #3 dehydration IV fluids.  #4 sinus tachycardia Likely secondary to volume depletion nausea or vomiting and inability to take her oral Cardizem.  #5 anxiety/depression Continue home regimen Ativan.  #6 hypothyroidism On IV Synthroid. Once tolerating oral intake will change to oral Synthroid.  #7 gastroesophageal reflux disease PPI.  #8 chronic respiratory failure on home O2.  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  #9 metastatic breast cancer/Recurrent malignant pleural effusion status post right Pleurx catheter 01/02/2015/talc pleurodesis and removal of catheter 01/16/2015/loculated right pleural effusion Patient currently on chronic home O2. Continue oxygen.  #10 hypertension  On IV Lopressor.  #11 diabetes mellitus type 2 Cont on sliding scale insulin.  #12 anemia Likely anemia of chronic disease. Follow H&H. - Low iron. Will start iron replacement  #13 prophylaxis PPI for GI prophylaxis. Lovenox for DVT prophylaxis.  Code Status: Full Family Communication: Pt in room,  (indicate person spoken with, relationship, and if by phone, the number) Disposition Plan: Poss d/c 7/12   Consultants:    Procedures:    Antibiotics:     HPI/Subjective: Feels much better today. Eager to go home  Objective: Filed Vitals:   02/11/15 1900  02/11/15 1930 02/11/15 2055 02/12/15 0444  BP: 134/78 128/75 126/57 109/56  Pulse: 107 110 108 106  Temp:   98.7 F (37.1 C) 98.4 F (36.9 C)  TempSrc:   Oral Oral  Resp:   18 18  Height:   5\' 4"  (1.626 m)   Weight:   84.414 kg (186 lb 1.6 oz)   SpO2: 100% 100% 100% 100%    Intake/Output Summary (Last 24 hours) at 02/12/15 1616 Last data filed at 02/12/15 0759  Gross per 24 hour  Intake   2700 ml  Output    700 ml  Net   2000 ml   Filed Weights   02/11/15 0927 02/11/15 2055  Weight: 85.276 kg (188 lb) 84.414 kg (186 lb 1.6 oz)    Exam:   General:  Awake, in nad  Cardiovascular: regular, s1, s2  Respiratory: normal resp effort, no wheezing  Abdomen: soft,nondistended  Musculoskeletal: perfused, no clubbing   Data Reviewed: Basic Metabolic Panel:  Recent Labs Lab 02/07/15 0919 02/11/15 0950 02/11/15 2340 02/12/15 0453 02/12/15 0531  NA 141 139 136 133* 132*  K 3.7 2.9* 3.3* 3.6 3.4*  CL  --  95* 96* 95* 94*  CO2 27 31 32 30 30  GLUCOSE 108 179* 122* 121* 127*  BUN 16.8 18 8 7 6   CREATININE 0.8 0.75 0.61 0.60 0.51  CALCIUM 10.4 9.5 8.3* 8.2* 8.2*  MG  --   --  1.1* 1.6* 2.6*   Liver Function Tests:  Recent Labs Lab 02/07/15 0919 02/11/15 0950 02/12/15 0453 02/12/15 0531  AST 14 17 18 17   ALT 9 12* 11* 11*  ALKPHOS 106 83 75 78  BILITOT 0.42 0.6 1.0 1.1  PROT 6.8 6.6 6.1* 6.3*  ALBUMIN 3.4* 3.7 3.2* 3.4*  Recent Labs Lab 02/11/15 0950  LIPASE 12*   No results for input(s): AMMONIA in the last 168 hours. CBC:  Recent Labs Lab 02/07/15 0918 02/11/15 0950 02/12/15 0453  WBC 12.5* 6.3 7.9  NEUTROABS 9.5* 5.2  --   HGB 10.1* 10.4* 9.3*  HCT 31.6* 33.1* 29.6*  MCV 91.4 93.2 92.8  PLT 217 203 193   Cardiac Enzymes:  Recent Labs Lab 02/12/15 0531 02/12/15 1210  TROPONINI 0.03 0.03   BNP (last 3 results)  Recent Labs  10/30/14 1300  BNP 55.3    ProBNP (last 3 results) No results for input(s): PROBNP in the last 8760  hours.  CBG:  Recent Labs Lab 02/11/15 1944 02/11/15 2242 02/12/15 0733 02/12/15 1247  GLUCAP 133* 109* 151* 134*    No results found for this or any previous visit (from the past 240 hour(s)).   Studies: Dg Abd Acute W/chest  02/11/2015   CLINICAL DATA:  Nausea.  Abdominal pain.  Diarrhea.  Emesis.  EXAM: DG ABDOMEN ACUTE W/ 1V CHEST  COMPARISON:  02/07/2015  FINDINGS: Power injectable Port-A-Cath observed, tip in the inferior portion of the right atrium.  Stable right pleural effusion with passive atelectasis. Bilateral axillary clips.  Mild cardiomegaly.  No significant abnormal air-fluid levels. No dilated bowel. Vascular calcifications in the anatomic pelvis.  IMPRESSION: 1. Unremarkable bowel gas pattern. 2. Stable right pleural effusion. 3. Stable cardiomegaly. 4. Atherosclerotic aortic arch. 5. The right internal jugular power injectable Port-A-Cath tip is positioned inferiorly in the right atrium.   Electronically Signed   By: Van Clines M.D.   On: 02/11/2015 15:01    Scheduled Meds: . enoxaparin (LOVENOX) injection  40 mg Subcutaneous QHS  . insulin aspart  0-9 Units Subcutaneous TID WC  . latanoprost  1 drop Both Eyes QHS  . levothyroxine  25 mcg Intravenous Daily  . loratadine  10 mg Oral Daily  . metoprolol  2.5 mg Intravenous 3 times per day  . pantoprazole (PROTONIX) IV  40 mg Intravenous QHS  . sodium chloride  3 mL Intravenous Q12H   Continuous Infusions: . sodium chloride 0.9 % 1,000 mL with potassium chloride 40 mEq infusion 100 mL/hr at 02/12/15 1551    Principal Problem:   Nausea and vomiting in adult Active Problems:   Hypothyroidism   Morbid obesity   ANXIETY DEPRESSION   Essential hypertension   Allergic rhinitis   GERD   Breast cancer of upper-outer quadrant of left female breast   B12 deficiency   Hypokalemia   Malignant pleural effusion   Recurrent right pleural effusion   Dehydration   Tachycardia   Chronic respiratory failure; On  Home oxygen 2L since 12/03/2014   Carcinoma of breast metastatic to lung   Nausea & vomiting   Anemia   Hayden Kihara K  [facility] Pager [phone]. If 7PM-7AM, please contact night-coverage at [email or web address], password [facility] 02/12/2015, 4:16 PM  LOS: 1 day

## 2015-02-12 NOTE — Progress Notes (Signed)
This shift pt had 6 beat run of VTACH. BP 109 56  HR 106 alert oriented and asymptomatic. Attending, T. Rogue Bussing notified. Advised potassium infusing and magnesium not yet infusing. Will delay draw of magnesium from 5 am to after 6am. [place] notified of change. Will continue to monitor pt.

## 2015-02-12 NOTE — Progress Notes (Signed)
PT Cancellation Note  Patient Details Name: Christine [name] MRN: [id number] DOB: 1955-10-13   Cancelled Treatment:    Reason Eval/Treat Not Completed: PT screened, no needs identified, will sign off (patient reports up ad lib, no PT needs  )   Claretha Cooper 02/12/2015, 11:30 AM Tresa Endo PT [phone]

## 2015-02-12 NOTE — Progress Notes (Signed)
OT Cancellation Note  Patient Details Name: BRIAN KOCOUREK MRN: [id number] DOB: Dec 07, 1955   Cancelled Treatment:    Reason Eval/Treat Not Completed: OT screened, no needs identified, will sign off  [place], Thereasa Parkin 02/12/2015, 1:35 PM

## 2015-02-13 ENCOUNTER — Other Ambulatory Visit: Payer: Self-pay

## 2015-02-13 DIAGNOSIS — C50919 Malignant neoplasm of unspecified site of unspecified female breast: Secondary | ICD-10-CM

## 2015-02-13 DIAGNOSIS — G43A1 Cyclical vomiting, intractable: Secondary | ICD-10-CM

## 2015-02-13 DIAGNOSIS — D508 Other iron deficiency anemias: Secondary | ICD-10-CM

## 2015-02-13 DIAGNOSIS — C7800 Secondary malignant neoplasm of unspecified lung: Secondary | ICD-10-CM

## 2015-02-13 DIAGNOSIS — E876 Hypokalemia: Secondary | ICD-10-CM

## 2015-02-13 LAB — BASIC METABOLIC PANEL
ANION GAP: 7 (ref 5–15)
CO2: 30 mmol/L (ref 22–32)
Calcium: 8.3 mg/dL — ABNORMAL LOW (ref 8.9–10.3)
Chloride: 99 mmol/L — ABNORMAL LOW (ref 101–111)
Creatinine, Ser: 0.57 mg/dL (ref 0.44–1.00)
GFR calc Af Amer: >60 mL/min (ref >60)
GFR calc non Af Amer: >60 mL/min (ref >60)
Glucose, Bld: 115 mg/dL — ABNORMAL HIGH (ref 65–99)
POTASSIUM: 3.8 mmol/L (ref 3.5–5.1)
SODIUM: 136 mmol/L (ref 135–145)

## 2015-02-13 LAB — GLUCOSE, CAPILLARY
GLUCOSE-CAPILLARY: 124 mg/dL — AB (ref 65–99)
Glucose-Capillary: 100 mg/dL — ABNORMAL HIGH (ref 65–99)

## 2015-02-13 LAB — HEMOGLOBIN A1C
Hgb A1c MFr Bld: 6.3 % — ABNORMAL HIGH (ref 4.8–5.6)
MEAN PLASMA GLUCOSE: 134 mg/dL

## 2015-02-13 MED ORDER — HEPARIN SOD (PORK) LOCK FLUSH 100 UNIT/ML IV SOLN
500.0000 [IU] | INTRAVENOUS | Status: AC | PRN
  Administered 2015-02-13: 500 [IU]

## 2015-02-13 MED ORDER — FERROUS SULFATE 325 (65 FE) MG PO TABS: 325.0000 mg | ORAL_TABLET | Freq: Every day | ORAL | Status: DC

## 2015-02-13 NOTE — Discharge Summary (Addendum)
Physician Discharge Summary  Christine [name] FFM:[id number] DOB: [birth year] DOA: 02/11/2015  PCP: Colin Benton R., DO  Admit date: 02/11/2015 Discharge date: 02/13/2015  Time spent: 20 minutes  Recommendations for Outpatient Follow-up:  1. Please follow up with PCP in 1-2 weeks 2. Follow up with Dr. 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 Briefly, pt presented with intractable nausea/vomiting with hypokalemia and dehydration. The patient was admitted for further work up.  Hospital Course:  #1 nausea and vomiting, suspected infectious gastroenteritis Suspect secondary to infectious gastroenteritis. Patient recalled eating fried chicken just prior to onset of symptoms. Acute abdominal series was negative for any acute abnormalities. - Clinically much improved with bowel rest and IVF overnight - the patient successfully advanced diet to a soft diet  #2 hypokalemia - Secondary to GI losses - Replaced - Replaced Mg (see below)  #3 dehydration - Patient was continued on IV fluids.  #4 sinus tachycardia - Likely secondary to volume depletion nausea or vomiting and inability to  take her oral Cardizem.  #5 anxiety/depression - Continued home regimen Ativan.  #6 hypothyroidism - On IV Synthroid. Once tolerating oral intake will change to oral Synthroid.  #7 gastroesophageal reflux disease PPI.  #8 chronic respiratory failure on home O2.  -likely secondary to metastatic breast cancer to the lungs with history of malignant pleural effusion/loculated pleural effusion.  #9 metastatic breast cancer/Recurrent malignant pleural effusion status post right Pleurx catheter 01/02/2015/talc pleurodesis and removal of catheter 01/16/2015/loculated right pleural effusion Patient currently on chronic home O2. Continue oxygen. - Have updated pt's Oncologist, Dr. Lindi Adie, of pt's admission  #10 hypertension  Cont on IV Lopressor while admitted, to resume home meds on d/c  #11 diabetes mellitus type 2 Cont on sliding scale insulin.  #12 anemia, iron deficiency Likely anemia of chronic disease. Follow H&H. - Low iron. Will start iron replacement  #13 prophylaxis PPI for GI prophylaxis. Lovenox for DVT prophylaxis.  Consultations:  Called Dr. Lindi Adie  Discharge Exam: Filed Vitals:   02/12/15 0444 02/12/15 1623 02/12/15 2149 02/13/15 0633  BP: 109/56 126/75 122/73 102/70  Pulse: 106 104 109 110  Temp: 98.4 F (36.9 C) 98.1 F (36.7 C) 98.2 F (36.8 C) 98.6 F (37 C)  TempSrc: Oral Oral Oral Oral  Resp: $Remo'18 20 20 18  'tHmRo$ Height:      Weight:    84.369 kg (186 lb)  SpO2: 100% 100%  94%    General: Awake, in nad Cardiovascular: regular, s1, s2 Respiratory: normal resp effort, no wheezing  Discharge Instructions     Medication List    STOP taking  these medications        predniSONE 10 MG tablet  Commonly known as:  DELTASONE      TAKE these medications        cetirizine 10 MG tablet  Commonly known as:  ZYRTEC  Take 10 mg by mouth daily.     cholecalciferol 1000 UNITS tablet  Commonly known as:  VITAMIN D  Take 1,000 Units by mouth every morning.      diltiazem 120 MG 24 hr capsule  Commonly known as:  CARDIZEM CD  Take 1 capsule (120 mg total) by mouth every morning.     esomeprazole 20 MG capsule  Commonly known as:  NEXIUM  Take 20 mg by mouth daily.     ferrous sulfate 325 (65 FE) MG tablet  Take 1 tablet (325 mg total) by mouth daily with breakfast.     fluticasone 50 MCG/ACT nasal spray  Commonly known as:  FLONASE  Place 2 sprays into both nostrils daily as needed for allergies or rhinitis.     glucose blood test strip  Commonly known as:  ONETOUCH VERIO  Use as instructed to check blood sugar once a day     latanoprost 0.005 % ophthalmic solution  Commonly known as:  XALATAN  Place 1 drop into both eyes at bedtime.     levothyroxine 50 MCG tablet  Commonly known as:  SYNTHROID, LEVOTHROID  Take 1 tablet (50 mcg total) by mouth daily.     lidocaine-prilocaine cream  Commonly known as:  EMLA  Apply to affected area once     LORazepam 0.5 MG tablet  Commonly known as:  ATIVAN  Take 1 tablet (0.5 mg total) by mouth every 6 (six) hours as needed (Nausea or vomiting).     metFORMIN 500 MG tablet  Commonly known as:  GLUCOPHAGE  Take $Rem'1000mg'HGAT$  (2 tabs) in the morning and $RemoveBef'500mg'DwwbkViBKO$  (1 tab) in the evening with meals     naproxen sodium 220 MG tablet  Commonly known as:  ANAPROX  Take 440 mg by mouth daily as needed (pain).     NON FORMULARY  Apply 1 application topically as needed (FUNGIFOAM - Applies to feet for athlete's foot.).     ondansetron 8 MG tablet  Commonly known as:  ZOFRAN  Take 1 tablet (8 mg total) by mouth 2 (two) times daily. Start the day after chemo for 2 days. Then take as needed for nausea or vomiting.     onetouch ultrasoft lancets  Use as instructed     ONETOUCH VERIO FLEX SYSTEM W/DEVICE Kit  1 Device by Does not apply route once.     oxyCODONE 5 MG immediate release tablet  Commonly known as:  Oxy IR/ROXICODONE  Take 1 tablet (5 mg total) by mouth every 4 (four) hours as needed for  moderate pain.     potassium chloride SA 20 MEQ tablet  Commonly known as:  KLOR-CON M20  TAKE 1 TABLET (20 MEQ TOTAL) BY MOUTH 2 (TWO) TIMES DAILY.     prochlorperazine 10 MG tablet  Commonly known as:  COMPAZINE  Take 1 tablet (10 mg total) by mouth every 6 (six) hours as needed (Nausea or vomiting).     VISINE OP  Apply 1-2 drops to eye daily as needed (itchy eyes.).       Allergies  Allergen Reactions  . Sulfa Antibiotics Hives, Itching and Swelling  . Sulfonamide Derivatives Hives, Itching and Swelling  . Tomato Hives and Itching  Follow-up Information    Follow up with Colin Benton R., DO. Schedule an appointment as soon as possible for a visit in 1 week.   Specialty:  Family Medicine   Why:  Hospital follow up   Contact information:   [place] [address] [phone]       Schedule an appointment as soon as possible for a visit with Rulon Eisenmenger, MD.   Specialty:  Hematology and Oncology   Why:  Hospital follow up   Contact information:   [place] [address] [phone]        The results of significant diagnostics from this hospitalization (including imaging, microbiology, ancillary and laboratory) are listed below for reference.    Significant Diagnostic Studies: Dg Chest 1 View  01/24/2015   CLINICAL DATA:  Post right-sided thoracentesis  EXAM: CHEST  1 VIEW  COMPARISON:  01/22/2015; 01/12/2015 ; PET-CT - 11/27/2014.  FINDINGS: Very minimal reduction in size of persistent small partially loculated right-sided effusion post thoracentesis. No pneumothorax.  Grossly unchanged cardiac silhouette and mediastinal contours. Stable position of support apparatus.  Known dominant right lower lobe pulmonary mass appears grossly unchanged as to right mid and lower lung heterogeneous opacities favored to represent atelectasis. No new focal airspace opacities. Post left-sided mastectomy and left axillary adenectomy. Unchanged  bones  IMPRESSION: Minimal reduction in size of persistent small partially loculated right-sided effusion post thoracentesis. No pneumothorax.   Electronically Signed   By: Sandi Mariscal M.D.   On: 01/24/2015 14:46   Dg Chest 2 View  02/07/2015   CLINICAL DATA:  Malignancy .  EXAM: CHEST  2 VIEW  COMPARISON:  CT 01/31/2015 .  FINDINGS: Power port catheter in good anatomic position. Mediastinum and hilar structures stable. Stable cardiomegaly . No pulmonary venous congestion. Loculated right pleural fluid collection again noted. Previously identified pulmonary nodules best seen on recent CT. Surgical clips noted over the axilla.  IMPRESSION: 1. Loculated right pleural fluid collection again noted. 2. Previously identified pulmonary nodules best seen by recent CT. 3. Power port catheter in good anatomic position.   Electronically Signed   By: Marcello Moores  Register   On: 02/07/2015 15:33   Dg Chest 2 View  01/22/2015   CLINICAL DATA:  Breast cancer, pleural effusion, recent PleurX catheter removal and talc pleurodesis. Shortness of breath today.  EXAM: CHEST  2 VIEW  COMPARISON:  01/12/2015.  FINDINGS: Trachea is midline. Heart is enlarged, stable. Right IJ power port tip is at the SVC RA junction or high right atrium. Increased hazy opacification in the right hemi thorax with consolidation at the right lung base. Rounded lesion at the base the right hemithorax is noted as well. Scattered pulmonary nodules in the left lung.  IMPRESSION: 1. Large right pleural effusion, increased from 01/12/2015 and appears loculated. 2. Scattered pulmonary metastases. 3. Right basilar consolidation.   Electronically Signed   By: Lorin Picket M.D.   On: 01/22/2015 14:47   Ct Chest W Contrast  01/31/2015   CLINICAL DATA:  Short of breath. Known lung nodules. Bilateral breast cancer.  EXAM: CT CHEST WITH CONTRAST  TECHNIQUE: Multidetector CT imaging of the chest was performed during intravenous contrast administration.  CONTRAST:   193mL OMNIPAQUE IOHEXOL 300 MG/ML  SOLN  COMPARISON:  PET-CT 11/27/2014  FINDINGS: Mediastinum/Nodes: No axillary lymphadenopathy. Bilateral axillary clips are noted. No mediastinal or hilar lymphadenopathy. No pericardial fluid. Right perihilar mass seen on comparison PET-CT scan is no longer evident.  Lungs/Pleura: There is loculated fluid collections within the right hemi thorax. Rounded 2 cm fluid collection in the right lower lobe also appears to be loculated fluid (image 40, series 2). There is atelectasis within the right upper lobe and right lower lobe associated with the fluid. Within the right middle lobe there is a 5 mm nodule image 41. Difficult to place on comparison exam but of appears likely unchanged from 6 mm nodule. Peripheral nodule in the right lower lobe measuring 4 mm is not seen on prior due to large pleural effusion.  In the left lung, left lower lobe nodule measures 9 mm on image 22, series 4. This nodule is decreased in size from 15 mm on PET-CT scan. Left lower lobe nodule image 29 measuring 9 mm decreased from 11 mm.  Upper abdomen: No focal hepatic lesion limited view of the liver. Adrenal glands are normal.  Musculoskeletal: There is a fluid collection within the anterior right chest wall which may represent malshaped breast prosthetic. No aggressive osseous lesion.  IMPRESSION: 1. Decrease in right hilar mass. 2. 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 3. Interval decrease in right pleural effusion. Persistent loculated right pleural effusion is present. 4. Right pulmonary nodules also appear decreased in size on the difficult to measure due to the effusion   Electronically Signed   By: Suzy Bouchard M.D.   On: 01/31/2015 14:46   Dg Abd Acute W/chest  02/11/2015   CLINICAL DATA:  Nausea.  Abdominal pain.  Diarrhea.  Emesis.  EXAM: DG ABDOMEN ACUTE W/ 1V CHEST  COMPARISON:  02/07/2015  FINDINGS: Power injectable Port-A-Cath observed, tip in the inferior  portion of the right atrium.  Stable right pleural effusion with passive atelectasis. Bilateral axillary clips.  Mild cardiomegaly.  No significant abnormal air-fluid levels. No dilated bowel. Vascular calcifications in the anatomic pelvis.  IMPRESSION: 1. Unremarkable bowel gas pattern. 2. Stable right pleural effusion. 3. Stable cardiomegaly. 4. Atherosclerotic aortic arch. 5. The right internal jugular power injectable Port-A-Cath tip is positioned inferiorly in the right atrium.   Electronically Signed   By: Van Clines M.D.   On: 02/11/2015 15:01   Ir Thoracentesis Asp Pleural Space W/img Guide  01/24/2015   INDICATION: History of breast cancer, post recent surgically placed PleurX catheter placement and subsequent talc pleurodesis, now with recurrent symptomatic right-sided pleural effusion. Please perform ultrasound-guided thoracentesis for therapeutic purposes.  EXAM: IR THORACENTESIS ASP PLEURAL SPACE W/IMG GUIDE  COMPARISON:  Chest radiograph- 01/22/2015; 01/12/2015 ; ultrasound-guided right-sided thoracentesis - 12/14/2014; 12/02/2014  MEDICATIONS: None  COMPLICATIONS: None immediate  TECHNIQUE: Informed written consent was obtained from the patient after a discussion of the risks, benefits and alternatives to treatment. A timeout was performed prior to the initiation of the procedure.  Initial ultrasound scanning demonstrates a small right-sided pleural effusion. The lower chest was prepped and draped in the usual sterile fashion. 1% lidocaine was used for local anesthesia.  Under direct ultrasound guidance, a 19 gauge, 10-cm, Yueh catheter was introduced. An ultrasound image was saved for documentation purposes. The thoracentesis was performed. The catheter was removed and a dressing was applied. The patient tolerated the procedure well without immediate post procedural complication. The patient was escorted to have an upright chest radiograph.  FINDINGS: A total of approximately 150 cc bloody  fluid was removed.  IMPRESSION: Successful ultrasound-guided right sided thoracentesis yielding 150 cc of bloody pleural fluid.  PLAN: If the patient's cough and shortness of breath persists, would recommend  further evaluation with contrast-enhanced chest CT to evaluate for potential loculations within the right-sided pleural effusion post recent talc pleurodesis.   Electronically Signed   By: Sandi Mariscal M.D.   On: 01/24/2015 14:47    Microbiology: No results found for this or any previous visit (from the past 240 hour(s)).   Labs: Basic Metabolic Panel:  Recent Labs Lab 02/11/15 0950 02/11/15 2340 02/12/15 0453 02/12/15 0531 02/13/15 0433  NA 139 136 133* 132* 136  K 2.9* 3.3* 3.6 3.4* 3.8  CL 95* 96* 95* 94* 99*  CO2 31 32 $Rem'30 30 30  'uXZi$ GLUCOSE 179* 122* 121* 127* 115*  BUN $Re'18 8 7 6 'nNd$ <5*  CREATININE 0.75 0.61 0.60 0.51 0.57  CALCIUM 9.5 8.3* 8.2* 8.2* 8.3*  MG  --  1.1* 1.6* 2.6*  --    Liver Function Tests:  Recent Labs Lab 02/07/15 0919 02/11/15 0950 02/12/15 0453 02/12/15 0531  AST $Re'14 17 18 17  'Vss$ ALT 9 12* 11* 11*  ALKPHOS 106 83 75 78  BILITOT 0.42 0.6 1.0 1.1  PROT 6.8 6.6 6.1* 6.3*  ALBUMIN 3.4* 3.7 3.2* 3.4*    Recent Labs Lab 02/11/15 0950  LIPASE 12*   No results for input(s): AMMONIA in the last 168 hours. CBC:  Recent Labs Lab 02/07/15 0918 02/11/15 0950 02/12/15 0453  WBC 12.5* 6.3 7.9  NEUTROABS 9.5* 5.2  --   HGB 10.1* 10.4* 9.3*  HCT 31.6* 33.1* 29.6*  MCV 91.4 93.2 92.8  PLT 217 203 193   Cardiac Enzymes:  Recent Labs Lab 02/12/15 0531 02/12/15 1210 02/12/15 1540  TROPONINI 0.03 0.03 0.03   BNP: BNP (last 3 results)  Recent Labs  10/30/14 1300  BNP 55.3    ProBNP (last 3 results) No results for input(s): PROBNP in the last 8760 hours.  CBG:  Recent Labs Lab 02/12/15 0733 02/12/15 1247 02/12/15 1620 02/12/15 2152 02/13/15 0733  GLUCAP 151* 134* 139* 141* 100*    Signed:  Alsha Meland K  [facility] 02/13/2015, 11:30 AM

## 2015-02-13 NOTE — Care Management Note (Signed)
Case Management Note  Patient Details  Name: Christine [name] MRN: [id number] Date of Birth: 08/08/1955  Subjective/Objective:    59 y/o f admitted w/n/v. From home.                Action/Plan:No d/c needs or orders.   Expected Discharge Date:                 Expected Discharge Plan:  Home/Self Care  In-House Referral:     Discharge planning Services  CM Consult  Post Acute Care Choice:    Choice offered to:     DME Arranged:    DME Agency:     HH Arranged:    [place] Agency:     Status of Service:  Completed, signed off  Medicare Important Message Given:    Date Medicare IM Given:    Medicare IM give by:    Date Additional Medicare IM Given:    Additional Medicare Important Message give by:     If discussed at [place] of Stay Meetings, dates discussed:    Additional Comments:  Dessa Phi, RN 02/13/2015, 9:28 AM

## 2015-02-14 ENCOUNTER — Encounter: Payer: Self-pay | Admitting: Hematology and Oncology

## 2015-02-14 ENCOUNTER — Ambulatory Visit (HOSPITAL_BASED_OUTPATIENT_CLINIC_OR_DEPARTMENT_OTHER): Payer: BLUE CROSS/BLUE SHIELD

## 2015-02-14 ENCOUNTER — Ambulatory Visit (HOSPITAL_BASED_OUTPATIENT_CLINIC_OR_DEPARTMENT_OTHER): Payer: BLUE CROSS/BLUE SHIELD | Admitting: Hematology and Oncology

## 2015-02-14 ENCOUNTER — Other Ambulatory Visit (HOSPITAL_BASED_OUTPATIENT_CLINIC_OR_DEPARTMENT_OTHER): Payer: BLUE CROSS/BLUE SHIELD

## 2015-02-14 VITALS — BP 120/76 | HR 127 | Temp 98.8°F | Resp 20 | Ht 64.0 in | Wt 189.1 lb

## 2015-02-14 DIAGNOSIS — Z171 Estrogen receptor negative status [ER-]: Secondary | ICD-10-CM

## 2015-02-14 DIAGNOSIS — Z5111 Encounter for antineoplastic chemotherapy: Secondary | ICD-10-CM | POA: Diagnosis not present

## 2015-02-14 DIAGNOSIS — C50412 Malignant neoplasm of upper-outer quadrant of left female breast: Secondary | ICD-10-CM

## 2015-02-14 DIAGNOSIS — C50911 Malignant neoplasm of unspecified site of right female breast: Secondary | ICD-10-CM

## 2015-02-14 DIAGNOSIS — D6481 Anemia due to antineoplastic chemotherapy: Secondary | ICD-10-CM

## 2015-02-14 DIAGNOSIS — D6959 Other secondary thrombocytopenia: Secondary | ICD-10-CM | POA: Diagnosis not present

## 2015-02-14 DIAGNOSIS — D701 Agranulocytosis secondary to cancer chemotherapy: Secondary | ICD-10-CM

## 2015-02-14 DIAGNOSIS — C7802 Secondary malignant neoplasm of left lung: Secondary | ICD-10-CM | POA: Diagnosis not present

## 2015-02-14 DIAGNOSIS — C773 Secondary and unspecified malignant neoplasm of axilla and upper limb lymph nodes: Secondary | ICD-10-CM

## 2015-02-14 DIAGNOSIS — C50912 Malignant neoplasm of unspecified site of left female breast: Principal | ICD-10-CM

## 2015-02-14 LAB — CBC WITH DIFFERENTIAL/PLATELET
BASO%: 0.3 % (ref 0.0–2.0)
BASOS ABS: 0 10*3/uL (ref 0.0–0.1)
EOS ABS: 0.1 10*3/uL (ref 0.0–0.5)
EOS%: 1.3 % (ref 0.0–7.0)
HEMATOCRIT: 32 % — AB (ref 34.8–46.6)
HEMOGLOBIN: 10.3 g/dL — AB (ref 11.6–15.9)
LYMPH%: 17.7 % (ref 14.0–49.7)
MCH: 29.2 pg (ref 25.1–34.0)
MCHC: 32.2 g/dL (ref 31.5–36.0)
MCV: 90.7 fL (ref 79.5–101.0)
MONO#: 0.5 10*3/uL (ref 0.1–0.9)
MONO%: 9.3 % (ref 0.0–14.0)
NEUT#: 4.2 10*3/uL (ref 1.5–6.5)
NEUT%: 71.4 % (ref 38.4–76.8)
PLATELETS: 258 10*3/uL (ref 145–400)
RBC: 3.53 10*6/uL — ABNORMAL LOW (ref 3.70–5.45)
RDW: 19.8 % — ABNORMAL HIGH (ref 11.2–14.5)
WBC: 5.9 10*3/uL (ref 3.9–10.3)
lymph#: 1 10*3/uL (ref 0.9–3.3)

## 2015-02-14 LAB — COMPREHENSIVE METABOLIC PANEL (CC13)
ALBUMIN: 3.3 g/dL — AB (ref 3.5–5.0)
ALK PHOS: 86 U/L (ref 40–150)
ALT: 8 U/L (ref 0–55)
AST: 15 U/L (ref 5–34)
Anion Gap: 10 mEq/L (ref 3–11)
BUN: 6.9 mg/dL — AB (ref 7.0–26.0)
CO2: 30 mEq/L — ABNORMAL HIGH (ref 22–29)
Calcium: 10.1 mg/dL (ref 8.4–10.4)
Chloride: 99 mEq/L (ref 98–109)
Creatinine: 0.8 mg/dL (ref 0.6–1.1)
GLUCOSE: 128 mg/dL (ref 70–140)
Potassium: 3.8 mEq/L (ref 3.5–5.1)
Sodium: 138 mEq/L (ref 136–145)
Total Bilirubin: 0.62 mg/dL (ref 0.20–1.20)
Total Protein: 6.8 g/dL (ref 6.4–8.3)

## 2015-02-14 MED ORDER — PEGFILGRASTIM 6 MG/0.6ML ~~LOC~~ PSKT
6.0000 mg | PREFILLED_SYRINGE | Freq: Once | SUBCUTANEOUS | Status: AC
Start: 2015-02-14 — End: 2015-02-14
  Administered 2015-02-14: 6 mg via SUBCUTANEOUS
  Filled 2015-02-14: qty 0.6

## 2015-02-14 MED ORDER — SODIUM CHLORIDE 0.9 % IV SOLN
Freq: Once | INTRAVENOUS | Status: AC
  Administered 2015-02-14: 14:00:00 via INTRAVENOUS
  Filled 2015-02-14: qty 4

## 2015-02-14 MED ORDER — SODIUM CHLORIDE 0.9 % IV SOLN
1.2000 mg/m2 | Freq: Once | INTRAVENOUS | Status: AC
  Administered 2015-02-14: 2.55 mg via INTRAVENOUS
  Filled 2015-02-14: qty 5.1

## 2015-02-14 MED ORDER — SODIUM CHLORIDE 0.9 % IJ SOLN
10.0000 mL | INTRAMUSCULAR | Status: DC | PRN
  Administered 2015-02-14: 10 mL
  Filled 2015-02-14: qty 10

## 2015-02-14 MED ORDER — SODIUM CHLORIDE 0.9 % IV SOLN
Freq: Once | INTRAVENOUS | Status: AC
  Administered 2015-02-14: 13:00:00 via INTRAVENOUS

## 2015-02-14 MED ORDER — HEPARIN SOD (PORK) LOCK FLUSH 100 UNIT/ML IV SOLN
500.0000 [IU] | Freq: Once | INTRAVENOUS | Status: AC | PRN
  Administered 2015-02-14: 500 [IU]
  Filled 2015-02-14: qty 5

## 2015-02-14 NOTE — Patient Instructions (Signed)
Eribulin solution for injection What is this medicine? ERIBULIN is a chemotherapy drug. It is used to treat breast cancer. This medicine may be used for other purposes; ask your health care provider or pharmacist if you have questions. COMMON BRAND NAME(S): [place] What should I tell my health care provider before I take this medicine? They need to know if you have any of these conditions: -heart disease -kidney disease -liver disease -low blood counts, like low white cell, platelet, or red cell counts -an unusual or allergic reaction to eribulin, other medicines, foods, dyes, or preservatives -pregnant or trying to get pregnant -breast-feeding How should I use this medicine? This medicine is for infusion into a vein. It is given by a health care professional in a hospital or clinic setting. Talk to your pediatrician regarding the use of this medicine in children. Special care may be needed. Overdosage: If you think you've taken too much of this medicine contact a poison control center or emergency room at once. Overdosage: If you think you have taken too much of this medicine contact a poison control center or emergency room at once. NOTE: This medicine is only for you. Do not share this medicine with others. What if I miss a dose? It is important not to miss your dose. Call your doctor or health care professional if you are unable to keep an appointment. What may interact with this medicine? Do not take this medicine with any of the following medications: -amiodarone -astemizole -arsenic trioxide -bepridil -bretylium -chloroquine -chlorpromazine -cisapride -clarithromycin -dextromethorphan,  quinidine -disopyramide -dofetilide -droperidol -dronedarone -erythromycin -grepafloxacin -halofantrine -haloperidol -ibutilide -levomethadyl -mesoridazine -methadone -pentamidine -procainamide -quinidine -pimozide -posaconazole -probucol -propafenone -saquinavir -sotalol -sparfloxacin -terfenadine -thioridazine -troleandomycin -ziprasidone This list may not describe all possible interactions. Give your health care provider a list of all the medicines, herbs, non-prescription drugs, or dietary supplements you use. Also tell them if you smoke, drink alcohol, or use illegal drugs. Some items may interact with your medicine. What should I watch for while using this medicine? Your condition will be monitored carefully while you are receiving this medicine. This drug may make you feel generally unwell. This is not uncommon, as chemotherapy can affect healthy cells as well as cancer cells. Report any side effects. Continue your course of treatment even though you feel ill unless your doctor tells you to stop. Call your doctor or health care professional for advice if you get a fever, chills or sore throat, or other symptoms of a cold or flu. Do not treat yourself. This drug decreases your body's ability to fight infections. Try to avoid being around people who are sick. This medicine may increase your risk to bruise or bleed. Call your doctor or health care professional if you notice any unusual bleeding. Be careful brushing and flossing your teeth or using a toothpick because you may get an infection or bleed more easily. If you have any dental work done, tell your dentist you are receiving this medicine. Avoid taking products that contain aspirin, acetaminophen, ibuprofen, naproxen, or ketoprofen unless instructed by your doctor. These medicines may hide a fever. Do not become pregnant while taking this medicine. Women should inform their doctor if they wish to become pregnant or think  they might be pregnant. There is a potential for serious side effects to an unborn child. Talk to your health care professional or pharmacist for more information. Do not breast-feed an infant while taking this medicine. What side effects may I notice from receiving this medicine?   Side effects that you should report to your doctor or health care professional as soon as possible: -allergic reactions like skin rash, itching or hives, swelling of the face, lips, or tongue -low blood counts - this medicine may decrease the number of white blood cells, red blood cells and platelets. You may be at increased risk for infections and bleeding. -signs of infection - fever or chills, cough, sore throat, pain or difficulty passing urine -signs of decreased platelets or bleeding - bruising, pinpoint red spots on the skin, black, tarry stools, blood in the urine -signs of decreased red blood cells - unusually weak or tired, fainting spells, lightheadedness -pain, tingling, numbness in the hands or feet Side effects that usually do not require medical attention (Report these to your doctor or health care professional if they continue or are bothersome.): -constipation -hair loss -headache -loss of appetite -muscle or joint pain -nausea, vomiting -stomach pain This list may not describe all possible side effects. Call your doctor for medical advice about side effects. You may report side effects to FDA at 1-800-FDA-1088. Where should I keep my medicine? This drug is given in a hospital or clinic and will not be stored at home. NOTE: This sheet is a summary. It may not cover all possible information. If you have questions about this medicine, talk to your doctor, pharmacist, or health care provider.  2015, [employer]. (2009-07-12 23:04:37)  

## 2015-02-14 NOTE — Addendum Note (Signed)
Addended by: Prentiss Bells on: 02/14/2015 03:33 PM   Modules accepted: Orders, Medications

## 2015-02-14 NOTE — Progress Notes (Signed)
Patient Care Team: Lucretia Kern, DO as PCP - General (Family Medicine)  DIAGNOSIS: Breast cancer of upper-outer quadrant of left female breast   Staging form: Breast, AJCC 7th Edition     Clinical: Stage IIIB (T4, N1, cM0) - Signed by Thea Silversmith, MD on 10/29/2012       Prognostic indicators: ER-, PR- HER2 -      Pathologic: No stage assigned - Unsigned       Prognostic indicators: ER-, PR- HER2 -    SUMMARY OF ONCOLOGIC HISTORY:   Breast cancer of upper-outer quadrant of left female breast   10/06/1986 Initial Diagnosis Right breast mastectomy, breast cancer diagnosed during pregnancy, no adjuvant treatment given   09/30/2012 Initial Biopsy Left breast biopsy done for left nipple retraction: Invasive ductal carcinoma grade 3, ER/PR negative, HER-2 negative, Ki-67 21% with grade 3 DCIS   10/12/2012 Breast MRI Left breast diffuse enhancement measuring 13 x 12.5 x 9.5 cm with diffuse skin thickening and dermal enhancement, multiple enlarged level I left axillary lymph nodes largest 2.4 cm   11/02/2012 - 06/06/2013 Neo-Adjuvant Chemotherapy Neoadjuvant FEC 4 followed by Taxol carbo 8 discontinued for neuropathy, Gemzar Carbo 2   09/15/2013 - 11/01/2013 Radiation Therapy Xeloda with adjuvant radiation   10/30/2014 Imaging Right hilar node 1.6 cm, subcarinal 1.2 cm, 1 cm additional right hilar, right lower lobe 3.2 cm, RUL 1 cm, and LDL of 1.3 cm, moderate pleural effusion, right liver 0.8 cm, 15 cm fluid collection left chest wall   10/31/2014 Initial Biopsy Lung biopsy right lower lobe: Carcinoma with Extensive necrosis, microscopic foci of nonnecrotic non-small cell, poorly differentiated carcinoma, metastatic disease likely ER 0%, PR 0%, HER-2 negative   12/01/2014 Procedure Second thoracentesis for malignant pleural effusion   12/06/2014 -  Chemotherapy Halaven day 1 day 8 every 3 weeks   02/11/2015 - 02/13/2015 Hospital Admission Hospitalization for gastroenteritis, accompanied by nausea vomiting  dehydration and hypokalemia    CHIEF COMPLIANT:  Follow-up after recent hospitalization for  gastroenteritis  INTERVAL HISTORY: Christine [name] is a  59 year old above-mentioned history of left breast cancer who has metastatic disease and is now on palliative chemotherapy with Halaven.she was admitted to the hospital 02/11/2015 until 02/13/2015 with nausea vomiting dehydration and hypokalemia.  It was felt that the symptoms were related to eating fried chicken from [place]. She was discharged home and she is here today to discuss whether it is safe to resume chemotherapy. Since she left for home she does not have any further nausea she just feels uncomfortable in the belly and a lot of gassiness.  REVIEW OF SYSTEMS:   Constitutional: Denies fevers, chills or abnormal weight loss Eyes: Denies blurriness of vision Ears, nose, mouth, throat, and face: Denies mucositis or sore throat Respiratory:  Shortness of breath uses oxygen Cardiovascular: Denies palpitation, chest discomfort or lower extremity swelling Gastrointestinal:   Gassiness and discomfort , not had a bowel movement since Saturday. Skin: Denies abnormal skin rashes Lymphatics: Denies new lymphadenopathy or easy bruising Neurological:Denies numbness, tingling or new weaknesses Behavioral/Psych: Mood is stable, no new changes  All other systems were reviewed with the patient and are negative.  I have reviewed the past medical history, past surgical history, social history and family history with the patient and they are unchanged from previous note.  ALLERGIES:  is allergic to sulfa antibiotics; sulfonamide derivatives; and tomato.  MEDICATIONS:  Current Outpatient Prescriptions  Medication Sig Dispense Refill  . Blood Glucose Monitoring Suppl ([place])  W/DEVICE KIT 1 Device by Does not apply route once. (Patient not taking: Reported on 02/12/2015) 1 kit 0  . cetirizine (ZYRTEC) 10 MG tablet Take 10 mg by mouth  daily.    . cholecalciferol (VITAMIN D) 1000 UNITS tablet Take 1,000 Units by mouth every morning.     . diltiazem (CARDIZEM CD) 120 MG 24 hr capsule Take 1 capsule (120 mg total) by mouth every morning. 90 capsule 1  . esomeprazole (NEXIUM) 20 MG capsule Take 20 mg by mouth daily.     . ferrous sulfate 325 (65 FE) MG tablet Take 1 tablet (325 mg total) by mouth daily with breakfast. 30 tablet 0  . fluticasone (FLONASE) 50 MCG/ACT nasal spray Place 2 sprays into both nostrils daily as needed for allergies or rhinitis.    Marland Kitchen glucose blood (ONETOUCH VERIO) test strip Use as instructed to check blood sugar once a day (Patient not taking: Reported on 02/12/2015) 100 each 1  . Lancets (ONETOUCH ULTRASOFT) lancets Use as instructed (Patient not taking: Reported on 02/12/2015) 100 each 1  . latanoprost (XALATAN) 0.005 % ophthalmic solution Place 1 drop into both eyes at bedtime.     Marland Kitchen levothyroxine (SYNTHROID, LEVOTHROID) 50 MCG tablet Take 1 tablet (50 mcg total) by mouth daily. 90 tablet 3  . lidocaine-prilocaine (EMLA) cream Apply to affected area once (Patient taking differently: Apply 1 application topically as needed (for chemo). Apply to affected area once) 30 g 3  . LORazepam (ATIVAN) 0.5 MG tablet Take 1 tablet (0.5 mg total) by mouth every 6 (six) hours as needed (Nausea or vomiting). 30 tablet 0  . metFORMIN (GLUCOPHAGE) 500 MG tablet Take $RemoveBef'1000mg'pmlKGBkJMg$  (2 tabs) in the morning and $RemoveBef'500mg'zFYNHImXPI$  (1 tab) in the evening with meals 135 tablet 3  . naproxen sodium (ANAPROX) 220 MG tablet Take 440 mg by mouth daily as needed (pain).    . NON FORMULARY Apply 1 application topically as needed (FUNGIFOAM - Applies to feet for athlete's foot.).     Marland Kitchen ondansetron (ZOFRAN) 8 MG tablet Take 1 tablet (8 mg total) by mouth 2 (two) times daily. Start the day after chemo for 2 days. Then take as needed for nausea or vomiting. 30 tablet 1  . oxyCODONE (OXY IR/ROXICODONE) 5 MG immediate release tablet Take 1 tablet (5 mg total) by  mouth every 4 (four) hours as needed for moderate pain. 30 tablet 0  . potassium chloride SA (KLOR-CON M20) 20 MEQ tablet TAKE 1 TABLET (20 MEQ TOTAL) BY MOUTH 2 (TWO) TIMES DAILY. 60 tablet 0  . prochlorperazine (COMPAZINE) 10 MG tablet Take 1 tablet (10 mg total) by mouth every 6 (six) hours as needed (Nausea or vomiting). 30 tablet 1  . Tetrahydrozoline HCl (VISINE OP) Apply 1-2 drops to eye daily as needed (itchy eyes.).     No current facility-administered medications for this visit.   Facility-Administered Medications Ordered in Other Visits  Medication Dose Route Frequency Provider Last Rate Last Dose  . sodium chloride 0.9 % injection 10 mL  10 mL Intracatheter PRN Nicholas Lose, MD   10 mL at 02/14/15 1422    PHYSICAL EXAMINATION: ECOG PERFORMANCE STATUS: 1 - Symptomatic but completely ambulatory  Filed Vitals:   02/14/15 1246  BP: 120/76  Pulse: 127  Temp: 98.8 F (37.1 C)  Resp: 20   Filed Weights   02/14/15 1246  Weight: 189 lb 1.6 oz (85.775 kg)    GENERAL:alert, no distress and comfortable SKIN: skin color, texture, turgor are  normal, no rashes or significant lesions EYES: normal, Conjunctiva are pink and non-injected, sclera clear OROPHARYNX:no exudate, no erythema and lips, buccal mucosa, and tongue normal  NECK: supple, thyroid normal size, non-tender, without nodularity LYMPH:  no palpable lymphadenopathy in the cervical, axillary or inguinal LUNGS:  Diminished breath sounds in the lung bases HEART: regular rate & rhythm and no murmurs and no lower extremity edema ABDOMEN:abdomen soft, non-tender and  Increased bowel sounds Musculoskeletal:no cyanosis of digits and no clubbing  NEURO: alert & oriented x 3 with fluent speech, no focal motor/sensory deficits   LABORATORY DATA:  I have reviewed the data as listed   Chemistry      Component Value Date/Time   NA 138 02/14/2015 1235   NA 136 02/13/2015 0433   K 3.8 02/14/2015 1235   K 3.8 02/13/2015 0433    CL 99* 02/13/2015 0433   CL 101 01/25/2013 0952   CO2 30* 02/14/2015 1235   CO2 30 02/13/2015 0433   BUN 6.9* 02/14/2015 1235   BUN <5* 02/13/2015 0433   CREATININE 0.8 02/14/2015 1235   CREATININE 0.57 02/13/2015 0433      Component Value Date/Time   CALCIUM 10.1 02/14/2015 1235   CALCIUM 8.3* 02/13/2015 0433   ALKPHOS 86 02/14/2015 1235   ALKPHOS 78 02/12/2015 0531   AST 15 02/14/2015 1235   AST 17 02/12/2015 0531   ALT 8 02/14/2015 1235   ALT 11* 02/12/2015 0531   BILITOT 0.62 02/14/2015 1235   BILITOT 1.1 02/12/2015 0531       Lab Results  Component Value Date   WBC 5.9 02/14/2015   HGB 10.3* 02/14/2015   HCT 32.0* 02/14/2015   MCV 90.7 02/14/2015   PLT 258 02/14/2015   NEUTROABS 4.2 02/14/2015   ASSESSMENT & PLAN:  Breast cancer of upper-outer quadrant of left female breast 09/30/2012 :Stage III, triple negative, invasive ductal carcinoma of the left breast. Neoadjuvant chemotherapy following the surgery followed by adjuvant radiation with concurrent Xeloda. 3.9 cm size and 9/13 lymph nodes were positive with extracapsular extension. ER/PR negative HER-2 negative Ki-67 21% March 2016: CT chest revealed bilateral lung nodules biopsy-proven to be triple negative metastatic carcinoma , pleural effusion status post thoracentesis malignant cells on cytology, repeat thoracentesis 12/01/2014 and 12/12/2014 PET CT scan 11/27/2014 showed widespread metastatic disease to the thorax with multiple pulmonary nodules and masses and malignant right pleural effusion and right hilar and mediastinal malignant lymph nodes. ------------------------------------------------------------------------------------------------------------------------------------------------------------- Current Treatment: Cycle 4 day 8 Halaven (days 1,8 q 3 weeks) started 12/06/14 Shortness of breath: Related to malignant pleural effusion status post 3 thoracentesis.  Pleurx catheter placed 01/02/2015 by Dr.  Cyndia Bent.  Halaven toxicities: 1. Neutropenia: We started giving her Neulasta on day 8 of each cycle 2. Mild thrombocytopenia 3. Anemia grade 1 4. Reduced the dosage of Halaven from cycle 4 because of low WBC counts on day 8 5. Hospitalization 02/11/2015 for gastroenteritis probably related to food poisoning ([place])   I reviewed her blood counts and her symptoms. Her nausea vomiting and diarrhea have all resolved.  She still has some abdominal discomfort and gassiness But we elected to continue and resume chemotherapy and she will get cycle 4 day 8 of chemotherapy today. Return to clinic with cycle 5  No orders of the defined types were placed in this encounter.   The patient has a good understanding of the overall plan. she agrees with it. she will call with any problems that may develop before the next visit  here.   Rulon Eisenmenger, MD

## 2015-02-14 NOTE — Assessment & Plan Note (Signed)
09/30/2012 :Stage III, triple negative, invasive ductal carcinoma of the left breast. Neoadjuvant chemotherapy following the surgery followed by adjuvant radiation with concurrent Xeloda. 3.9 cm size and 9/13 lymph nodes were positive with extracapsular extension. ER/PR negative HER-2 negative Ki-67 21% March 2016: CT chest revealed bilateral lung nodules biopsy-proven to be triple negative metastatic carcinoma , pleural effusion status post thoracentesis malignant cells on cytology, repeat thoracentesis 12/01/2014 and 12/12/2014 PET CT scan 11/27/2014 showed widespread metastatic disease to the thorax with multiple pulmonary nodules and masses and malignant right pleural effusion and right hilar and mediastinal malignant lymph nodes. ------------------------------------------------------------------------------------------------------------------------------------------------------------- Current Treatment: Cycle 4 day 8 Halaven (days 1,8 q 3 weeks) started 12/06/14 Shortness of breath: Related to malignant pleural effusion status post 3 thoracentesis. She is currently on oxygen by nasal cannula. She is seeing thoracic surgery today to see if she needs pleurodesis  Halaven toxicities: 1. Neutropenia: We started giving her Neulasta on day 8 of each cycle 2. Mild thrombocytopenia 3. Anemia grade 1 4. Reduced the dosage of Halaven from cycle 4 because of low WBC counts on day 8 5. Hospitalization 02/11/2015 for gastroenteritis probably related to food poisoning  I reviewed her blood counts and her symptoms. Her nausea vomiting and diarrhea have all resolved. So we elected to continue and resume chemotherapy and she will get cycle 4 day 8 of chemotherapy today.

## 2015-02-20 ENCOUNTER — Ambulatory Visit: Payer: BLUE CROSS/BLUE SHIELD | Admitting: Family Medicine

## 2015-02-23 ENCOUNTER — Encounter: Payer: Self-pay | Admitting: Family Medicine

## 2015-02-23 ENCOUNTER — Ambulatory Visit (INDEPENDENT_AMBULATORY_CARE_PROVIDER_SITE_OTHER): Payer: BLUE CROSS/BLUE SHIELD | Admitting: Family Medicine

## 2015-02-23 VITALS — BP 120/78 | HR 100 | Temp 98.2°F | Ht 64.0 in | Wt 187.4 lb

## 2015-02-23 DIAGNOSIS — C7800 Secondary malignant neoplasm of unspecified lung: Secondary | ICD-10-CM | POA: Diagnosis not present

## 2015-02-23 DIAGNOSIS — J9611 Chronic respiratory failure with hypoxia: Secondary | ICD-10-CM | POA: Diagnosis not present

## 2015-02-23 DIAGNOSIS — C50919 Malignant neoplasm of unspecified site of unspecified female breast: Secondary | ICD-10-CM | POA: Diagnosis not present

## 2015-02-23 DIAGNOSIS — K529 Noninfective gastroenteritis and colitis, unspecified: Secondary | ICD-10-CM | POA: Diagnosis not present

## 2015-02-23 MED ORDER — METFORMIN HCL 500 MG PO TABS: ORAL_TABLET | ORAL | Status: AC

## 2015-02-23 NOTE — Progress Notes (Signed)
HPI:  Christine [name] is a pleasant 59 yo F whom unfortunately has metastatic breast ca, recurrent malignant pleural effusions on home O2, HTN, hypothyroidism, GAD here for follow up from recent hospitalization.  Hospitalized 7/10-7/07/2015 for N/V/Dehydration: -per discharge notes thought secondary to gastroenteritis -hypokalemic in hospital -had f/u with onc 7/13, notes reviewed, K+ normal on repeat labs 7/13, remains mildly anemia, several Halaven toxicities being monitored, tx recently resumed -reports today: doing much better, reports had tachycardia in hospital and wonders if should increase diltiazem -denies: abd pain, nausea, vomiting, diarrhea, CP, palpitations, swelling, fevers -TSH ok 02/11/15  ROS: See pertinent positives and negatives per HPI.  Past Medical History  Diagnosis Date  . ALLERGIC RHINITIS   . GERD (gastroesophageal reflux disease)   . Hypertension   . Hypothyroidism   . Hx of colonic polyps   . Wears glasses   . Radiation 09/15/13-11/01/13    Left chest wall/PAB/scar/supraclavicular fossa  . SYNCOPE 05/15/2010    Qualifier: Diagnosis of  By: Arnoldo Morale MD, [place], WITH RADICULOPATHY 10/01/2007    Qualifier: Diagnosis of  By: Arnoldo Morale MD, [place] 05/31/2007    Qualifier: Diagnosis of  By: Arnoldo Morale MD, [place], HX OF 05/31/2007    Qualifier: Diagnosis of  By: Arnoldo Morale MD, [place]inda Quails   . Lymph edema L arm from breast ca tx 12/22/2013  . H/O blood clots     hx of small blood clots in both legs  . Diabetes mellitus without complication     type 2 - on Metformin  . Anxiety   . Neuropathy due to chemotherapeutic drug   . Breast cancer 3, age 53    right  . Breast cancer 09/30/12    left, ER/PR -, Her 2 -  . Secondary lung cancer 2016  . Anemia     during chemo  . History of IBS   . Glaucoma   . Dry skin   . Lymphedema of arm     left arm, post mastectomy  . Shortness of breath dyspnea     with exertion (since  onset of pleural effusion) Wears O2 2 L as needed  . Chronic respiratory failure; On Home oxygen 2L since 12/03/2014 02/11/2015    Past Surgical History  Procedure Laterality Date  . Tonsillectomy    . Caesarean section      x 1  . Mastectomy  04/1987    right side  . Abdominal hysterectomy  06/1988    fibroids  . Portacath placement Right 10/28/2012    Procedure: PORT PLACEMENT;  Surgeon: Joyice Faster. Cornett, MD;  Location: [facility];  Service: General;  Laterality: Right;  Right Subclavian Vein  . Mastectomy modified radical Left 07/19/2013    Procedure: MASTECTOMY MODIFIED RADICAL;  Surgeon: Marcello Moores A. Cornett, MD;  Location: [facility];  Service: General;  Laterality: Left;  . Port-a-cath removal Right 07/19/2013    Procedure: REMOVAL PORT-A-CATH;  Surgeon: Joyice Faster. Cornett, MD;  Location: [facility];  Service: General;  Laterality: Right;  . Cesarean section    . Breast surgery Left 07/19/13    MRM  . Breast surgery Right     mastectomy  . Port-a-cath insertion  12/03/2014  . Colonoscopy    . Chest tube insertion Right 01/02/2015    Procedure: INSERTION PLEURAL DRAINAGE CATHETER RIGHT CHEST;  Surgeon: Gaye Pollack, MD;  Location: [facility];  Service: Thoracic;  Laterality: Right;  . Removal of pleural drainage catheter Right 01/16/2015    Procedure: REMOVAL OF PLEURAL DRAINAGE CATHETER;  Surgeon: Gaye Pollack, MD;  Location: [facility];  Service: Thoracic;  Laterality: Right;  . [place] pleurodesis Right 01/16/2015    Procedure: Pietro Cassis;  Surgeon: Gaye Pollack, MD;  Location: [facility] OR;  Service: Thoracic;  Laterality: Right;    Family History  Problem Relation Age of Onset  . Colon cancer Mother 70    family hx of colon ca 1st degree relative <60  . Hypertension Mother   . Coronary artery disease Father     family hx of CAD female 1st degree relative <50  . Stomach cancer Neg Hx   . Rectal cancer Neg Hx   . Esophageal cancer Neg Hx   .  Breast cancer Maternal Grandmother 80  . Diabetes Paternal Grandmother   . Breast cancer Cousin 64    maternal cousin  . Coronary artery disease Maternal Grandfather   . Breast cancer Sister     paternal half sister; died in her 62s    History   Social History  . Marital Status: Married    Spouse Name: Donnie  . Number of Children: 1  . Years of Education: N/A   Occupational History  . Unemployed    Social History Main Topics  . Smoking status: Never Smoker   . Smokeless tobacco: Never Used  . Alcohol Use: No  . Drug Use: No  . Sexual Activity: Not Currently     Comment: menarche 35, 1st pregnancy age 59-miscarr, age 11 1st live birth, menop 89   Other Topics Concern  . None   Social History Narrative   Married. Lives w/ husband.  Has not worked since being diagnosed with cancer 2 years ago.  Previously worked as a Solicitor.  Independent with ADLs.      Spiritual Beliefs:Christian      Lifestyle: started the [employer] exercise program for cancer survivors (12/2013); working on diet              Current outpatient prescriptions:  .  cetirizine (ZYRTEC) 10 MG tablet, Take 10 mg by mouth daily., Disp: , Rfl:  .  cholecalciferol (VITAMIN D) 1000 UNITS tablet, Take 1,000 Units by mouth every morning. , Disp: , Rfl:  .  diltiazem (CARDIZEM CD) 120 MG 24 hr capsule, Take 1 capsule (120 mg total) by mouth every morning., Disp: 90 capsule, Rfl: 1 .  esomeprazole (NEXIUM) 20 MG capsule, Take 20 mg by mouth daily. , Disp: , Rfl:  .  fluticasone (FLONASE) 50 MCG/ACT nasal spray, Place 2 sprays into both nostrils daily as needed for allergies or rhinitis., Disp: , Rfl:  .  latanoprost (XALATAN) 0.005 % ophthalmic solution, Place 1 drop into both eyes at bedtime. , Disp: , Rfl:  .  levothyroxine (SYNTHROID, LEVOTHROID) 50 MCG tablet, Take 1 tablet (50 mcg total) by mouth daily., Disp: 90 tablet, Rfl: 3 .  lidocaine-prilocaine (EMLA) cream, Apply to affected area once  (Patient taking differently: Apply 1 application topically as needed (for chemo). Apply to affected area once), Disp: 30 g, Rfl: 3 .  LORazepam (ATIVAN) 0.5 MG tablet, Take 1 tablet (0.5 mg total) by mouth every 6 (six) hours as needed (Nausea or vomiting)., Disp: 30 tablet, Rfl: 0 .  metFORMIN (GLUCOPHAGE) 500 MG tablet, Take 1000mg  (2 tabs) in the morning and 500mg  (1 tab) in the evening with meals, Disp:  270 tablet, Rfl: 3 .  naproxen sodium (ANAPROX) 220 MG tablet, Take 440 mg by mouth daily as needed (pain)., Disp: , Rfl:  .  NON FORMULARY, Apply 1 application topically as needed (FUNGIFOAM - Applies to feet for athlete's foot.). , Disp: , Rfl:  .  ondansetron (ZOFRAN) 8 MG tablet, Take 1 tablet (8 mg total) by mouth 2 (two) times daily. Start the day after chemo for 2 days. Then take as needed for nausea or vomiting., Disp: 30 tablet, Rfl: 1 .  oxyCODONE (OXY IR/ROXICODONE) 5 MG immediate release tablet, Take 1 tablet (5 mg total) by mouth every 4 (four) hours as needed for moderate pain., Disp: 30 tablet, Rfl: 0 .  potassium chloride SA (KLOR-CON M20) 20 MEQ tablet, TAKE 1 TABLET (20 MEQ TOTAL) BY MOUTH 2 (TWO) TIMES DAILY., Disp: 60 tablet, Rfl: 0 .  prochlorperazine (COMPAZINE) 10 MG tablet, Take 1 tablet (10 mg total) by mouth every 6 (six) hours as needed (Nausea or vomiting)., Disp: 30 tablet, Rfl: 1 .  Tetrahydrozoline HCl (VISINE OP), Apply 1-2 drops to eye daily as needed (itchy eyes.)., Disp: , Rfl:   EXAM:  Filed Vitals:   02/23/15 1259  BP: 120/78  Pulse: 100  Temp: 98.2 F (36.8 C)    Body mass index is 32.15 kg/(m^2).  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  There are no Patient Instructions on file for this visit.   Colin Benton R.

## 2015-02-23 NOTE — Progress Notes (Signed)
Pre visit review using our clinic review tool, if applicable. No additional management support is needed unless otherwise documented below in the visit note. 

## 2015-02-27 NOTE — Assessment & Plan Note (Signed)
09/30/2012 :Stage III, triple negative, invasive ductal carcinoma of the left breast. Neoadjuvant chemotherapy following the surgery followed by adjuvant radiation with concurrent Xeloda. 3.9 cm size and 9/13 lymph nodes were positive with extracapsular extension. ER/PR negative HER-2 negative Ki-67 21% March 2016: CT chest revealed bilateral lung nodules biopsy-proven to be triple negative metastatic carcinoma , pleural effusion status post thoracentesis malignant cells on cytology, repeat thoracentesis 12/01/2014 and 12/12/2014 PET CT scan 11/27/2014 showed widespread metastatic disease to the thorax with multiple pulmonary nodules and masses and malignant right pleural effusion and right hilar and mediastinal malignant lymph nodes. -------------------------------------------------------------------------------------------------------------------------------- Current Treatment: Cycle 5 day 8 Halaven (days 1,8 q 3 weeks) started 12/06/14 Shortness of breath: Related to malignant pleural effusion status post 3 thoracentesis. Pleurx catheter placed 01/02/2015 by Dr. Cyndia Bent.  Halaven toxicities: 1. Neutropenia: We started giving her Neulasta on day 8 of each cycle 2. Mild thrombocytopenia 3. Anemia grade 1 4. Reduced the dosage of Halaven from cycle 4 because of low WBC counts on day 8 5. Hospitalization 02/11/2015 for gastroenteritis probably related to food poisoning ([place])  I reviewed her blood counts and her symptoms. Her nausea vomiting and diarrhea have all resolved. She still has some abdominal discomfort and gassiness But we elected to continue and resume chemotherapy and she will get cycle 5 day 1 of chemotherapy today. Return to clinic with cycle 6 Then we will plan for scans.

## 2015-02-28 ENCOUNTER — Ambulatory Visit (HOSPITAL_BASED_OUTPATIENT_CLINIC_OR_DEPARTMENT_OTHER): Payer: BLUE CROSS/BLUE SHIELD

## 2015-02-28 ENCOUNTER — Other Ambulatory Visit (HOSPITAL_BASED_OUTPATIENT_CLINIC_OR_DEPARTMENT_OTHER): Payer: BLUE CROSS/BLUE SHIELD

## 2015-02-28 ENCOUNTER — Encounter: Payer: Self-pay | Admitting: Hematology and Oncology

## 2015-02-28 ENCOUNTER — Ambulatory Visit: Payer: BLUE CROSS/BLUE SHIELD

## 2015-02-28 ENCOUNTER — Telehealth: Payer: Self-pay | Admitting: Hematology and Oncology

## 2015-02-28 ENCOUNTER — Ambulatory Visit (HOSPITAL_BASED_OUTPATIENT_CLINIC_OR_DEPARTMENT_OTHER): Payer: BLUE CROSS/BLUE SHIELD | Admitting: Hematology and Oncology

## 2015-02-28 VITALS — BP 138/69 | HR 106 | Temp 97.8°F | Resp 19 | Ht 64.0 in | Wt 187.7 lb

## 2015-02-28 DIAGNOSIS — C50911 Malignant neoplasm of unspecified site of right female breast: Secondary | ICD-10-CM

## 2015-02-28 DIAGNOSIS — D701 Agranulocytosis secondary to cancer chemotherapy: Secondary | ICD-10-CM

## 2015-02-28 DIAGNOSIS — Z171 Estrogen receptor negative status [ER-]: Secondary | ICD-10-CM

## 2015-02-28 DIAGNOSIS — D6481 Anemia due to antineoplastic chemotherapy: Secondary | ICD-10-CM

## 2015-02-28 DIAGNOSIS — C7801 Secondary malignant neoplasm of right lung: Secondary | ICD-10-CM

## 2015-02-28 DIAGNOSIS — D6959 Other secondary thrombocytopenia: Secondary | ICD-10-CM

## 2015-02-28 DIAGNOSIS — Z853 Personal history of malignant neoplasm of breast: Secondary | ICD-10-CM

## 2015-02-28 DIAGNOSIS — C50412 Malignant neoplasm of upper-outer quadrant of left female breast: Secondary | ICD-10-CM

## 2015-02-28 DIAGNOSIS — C773 Secondary and unspecified malignant neoplasm of axilla and upper limb lymph nodes: Secondary | ICD-10-CM

## 2015-02-28 DIAGNOSIS — Z5111 Encounter for antineoplastic chemotherapy: Secondary | ICD-10-CM | POA: Diagnosis not present

## 2015-02-28 DIAGNOSIS — C50912 Malignant neoplasm of unspecified site of left female breast: Secondary | ICD-10-CM

## 2015-02-28 DIAGNOSIS — Z95828 Presence of other vascular implants and grafts: Secondary | ICD-10-CM

## 2015-02-28 LAB — COMPREHENSIVE METABOLIC PANEL (CC13)
ALBUMIN: 3.7 g/dL (ref 3.5–5.0)
ALT: 16 U/L (ref 0–55)
AST: 22 U/L (ref 5–34)
Alkaline Phosphatase: 91 U/L (ref 40–150)
Anion Gap: 10 mEq/L (ref 3–11)
BILIRUBIN TOTAL: 0.43 mg/dL (ref 0.20–1.20)
BUN: 17.8 mg/dL (ref 7.0–26.0)
CHLORIDE: 107 meq/L (ref 98–109)
CO2: 26 mEq/L (ref 22–29)
Calcium: 9.2 mg/dL (ref 8.4–10.4)
Creatinine: 0.8 mg/dL (ref 0.6–1.1)
Glucose: 143 mg/dl — ABNORMAL HIGH (ref 70–140)
POTASSIUM: 3.9 meq/L (ref 3.5–5.1)
Sodium: 143 mEq/L (ref 136–145)
Total Protein: 6.4 g/dL (ref 6.4–8.3)

## 2015-02-28 LAB — CBC WITH DIFFERENTIAL/PLATELET
BASO%: 0.3 % (ref 0.0–2.0)
Basophils Absolute: 0 10*3/uL (ref 0.0–0.1)
EOS ABS: 0 10*3/uL (ref 0.0–0.5)
EOS%: 0.1 % (ref 0.0–7.0)
HEMATOCRIT: 31.5 % — AB (ref 34.8–46.6)
HEMOGLOBIN: 10.1 g/dL — AB (ref 11.6–15.9)
LYMPH#: 1.3 10*3/uL (ref 0.9–3.3)
LYMPH%: 19.8 % (ref 14.0–49.7)
MCH: 30.4 pg (ref 25.1–34.0)
MCHC: 32 g/dL (ref 31.5–36.0)
MCV: 95.1 fL (ref 79.5–101.0)
MONO#: 0.6 10*3/uL (ref 0.1–0.9)
MONO%: 8.2 % (ref 0.0–14.0)
NEUT#: 4.8 10*3/uL (ref 1.5–6.5)
NEUT%: 71.6 % (ref 38.4–76.8)
Platelets: 194 10*3/uL (ref 145–400)
RBC: 3.31 10*6/uL — ABNORMAL LOW (ref 3.70–5.45)
RDW: 23.3 % — ABNORMAL HIGH (ref 11.2–14.5)
WBC: 6.7 10*3/uL (ref 3.9–10.3)

## 2015-02-28 MED ORDER — SODIUM CHLORIDE 0.9 % IV SOLN
Freq: Once | INTRAVENOUS | Status: AC
  Administered 2015-02-28: 12:00:00 via INTRAVENOUS

## 2015-02-28 MED ORDER — LORAZEPAM 0.5 MG PO TABS: 0.5000 mg | ORAL_TABLET | Freq: Four times a day (QID) | ORAL | Status: DC | PRN

## 2015-02-28 MED ORDER — SODIUM CHLORIDE 0.9 % IV SOLN
1.2000 mg/m2 | Freq: Once | INTRAVENOUS | Status: AC
  Administered 2015-02-28: 2.55 mg via INTRAVENOUS
  Filled 2015-02-28: qty 5.1

## 2015-02-28 MED ORDER — SODIUM CHLORIDE 0.9 % IV SOLN
Freq: Once | INTRAVENOUS | Status: AC
  Administered 2015-02-28: 12:00:00 via INTRAVENOUS
  Filled 2015-02-28: qty 4

## 2015-02-28 MED ORDER — SODIUM CHLORIDE 0.9 % IJ SOLN
10.0000 mL | INTRAMUSCULAR | Status: DC | PRN
  Administered 2015-02-28: 10 mL via INTRAVENOUS
  Filled 2015-02-28: qty 10

## 2015-02-28 MED ORDER — SODIUM CHLORIDE 0.9 % IJ SOLN
10.0000 mL | INTRAMUSCULAR | Status: DC | PRN
  Administered 2015-02-28: 10 mL
  Filled 2015-02-28: qty 10

## 2015-02-28 MED ORDER — HEPARIN SOD (PORK) LOCK FLUSH 100 UNIT/ML IV SOLN
500.0000 [IU] | Freq: Once | INTRAVENOUS | Status: AC | PRN
  Administered 2015-02-28: 500 [IU]
  Filled 2015-02-28: qty 5

## 2015-02-28 NOTE — Telephone Encounter (Signed)
Appointments made and avs printed for patient °

## 2015-02-28 NOTE — Patient Instructions (Signed)
Eribulin solution for injection What is this medicine? ERIBULIN is a chemotherapy drug. It is used to treat breast cancer. This medicine may be used for other purposes; ask your health care provider or pharmacist if you have questions. COMMON BRAND NAME(S): [place] What should I tell my health care provider before I take this medicine? They need to know if you have any of these conditions: -heart disease -kidney disease -liver disease -low blood counts, like low white cell, platelet, or red cell counts -an unusual or allergic reaction to eribulin, other medicines, foods, dyes, or preservatives -pregnant or trying to get pregnant -breast-feeding How should I use this medicine? This medicine is for infusion into a vein. It is given by a health care professional in a hospital or clinic setting. Talk to your pediatrician regarding the use of this medicine in children. Special care may be needed. Overdosage: If you think you've taken too much of this medicine contact a poison control center or emergency room at once. Overdosage: If you think you have taken too much of this medicine contact a poison control center or emergency room at once. NOTE: This medicine is only for you. Do not share this medicine with others. What if I miss a dose? It is important not to miss your dose. Call your doctor or health care professional if you are unable to keep an appointment. What may interact with this medicine? Do not take this medicine with any of the following medications: -amiodarone -astemizole -arsenic trioxide -bepridil -bretylium -chloroquine -chlorpromazine -cisapride -clarithromycin -dextromethorphan,  quinidine -disopyramide -dofetilide -droperidol -dronedarone -erythromycin -grepafloxacin -halofantrine -haloperidol -ibutilide -levomethadyl -mesoridazine -methadone -pentamidine -procainamide -quinidine -pimozide -posaconazole -probucol -propafenone -saquinavir -sotalol -sparfloxacin -terfenadine -thioridazine -troleandomycin -ziprasidone This list may not describe all possible interactions. Give your health care provider a list of all the medicines, herbs, non-prescription drugs, or dietary supplements you use. Also tell them if you smoke, drink alcohol, or use illegal drugs. Some items may interact with your medicine. What should I watch for while using this medicine? Your condition will be monitored carefully while you are receiving this medicine. This drug may make you feel generally unwell. This is not uncommon, as chemotherapy can affect healthy cells as well as cancer cells. Report any side effects. Continue your course of treatment even though you feel ill unless your doctor tells you to stop. Call your doctor or health care professional for advice if you get a fever, chills or sore throat, or other symptoms of a cold or flu. Do not treat yourself. This drug decreases your body's ability to fight infections. Try to avoid being around people who are sick. This medicine may increase your risk to bruise or bleed. Call your doctor or health care professional if you notice any unusual bleeding. Be careful brushing and flossing your teeth or using a toothpick because you may get an infection or bleed more easily. If you have any dental work done, tell your dentist you are receiving this medicine. Avoid taking products that contain aspirin, acetaminophen, ibuprofen, naproxen, or ketoprofen unless instructed by your doctor. These medicines may hide a fever. Do not become pregnant while taking this medicine. Women should inform their doctor if they wish to become pregnant or think  they might be pregnant. There is a potential for serious side effects to an unborn child. Talk to your health care professional or pharmacist for more information. Do not breast-feed an infant while taking this medicine. What side effects may I notice from receiving this medicine?   Side effects that you should report to your doctor or health care professional as soon as possible: -allergic reactions like skin rash, itching or hives, swelling of the face, lips, or tongue -low blood counts - this medicine may decrease the number of white blood cells, red blood cells and platelets. You may be at increased risk for infections and bleeding. -signs of infection - fever or chills, cough, sore throat, pain or difficulty passing urine -signs of decreased platelets or bleeding - bruising, pinpoint red spots on the skin, black, tarry stools, blood in the urine -signs of decreased red blood cells - unusually weak or tired, fainting spells, lightheadedness -pain, tingling, numbness in the hands or feet Side effects that usually do not require medical attention (Report these to your doctor or health care professional if they continue or are bothersome.): -constipation -hair loss -headache -loss of appetite -muscle or joint pain -nausea, vomiting -stomach pain This list may not describe all possible side effects. Call your doctor for medical advice about side effects. You may report side effects to FDA at 1-800-FDA-1088. Where should I keep my medicine? This drug is given in a hospital or clinic and will not be stored at home. NOTE: This sheet is a summary. It may not cover all possible information. If you have questions about this medicine, talk to your doctor, pharmacist, or health care provider.  2015, [employer]. (2009-07-12 23:04:37)  

## 2015-02-28 NOTE — Patient Instructions (Signed)

## 2015-02-28 NOTE — Progress Notes (Signed)
Patient Care Team: Lucretia Kern, DO as PCP - General (Family Medicine)  DIAGNOSIS: Breast cancer of upper-outer quadrant of left female breast   Staging form: Breast, AJCC 7th Edition     Clinical: Stage IIIB (T4, N1, cM0) - Signed by Thea Silversmith, MD on 10/29/2012       Prognostic indicators: ER-, PR- HER2 -      Pathologic: No stage assigned - Unsigned       Prognostic indicators: ER-, PR- HER2 -    SUMMARY OF ONCOLOGIC HISTORY:   Breast cancer of upper-outer quadrant of left female breast   10/06/1986 Initial Diagnosis Right breast mastectomy, breast cancer diagnosed during pregnancy, no adjuvant treatment given   09/30/2012 Initial Biopsy Left breast biopsy done for left nipple retraction: Invasive ductal carcinoma grade 3, ER/PR negative, HER-2 negative, Ki-67 21% with grade 3 DCIS   10/12/2012 Breast MRI Left breast diffuse enhancement measuring 13 x 12.5 x 9.5 cm with diffuse skin thickening and dermal enhancement, multiple enlarged level I left axillary lymph nodes largest 2.4 cm   11/02/2012 - 06/06/2013 Neo-Adjuvant Chemotherapy Neoadjuvant FEC 4 followed by Taxol carbo 8 discontinued for neuropathy, Gemzar Carbo 2   09/15/2013 - 11/01/2013 Radiation Therapy Xeloda with adjuvant radiation   10/30/2014 Imaging Right hilar node 1.6 cm, subcarinal 1.2 cm, 1 cm additional right hilar, right lower lobe 3.2 cm, RUL 1 cm, and LDL of 1.3 cm, moderate pleural effusion, right liver 0.8 cm, 15 cm fluid collection left chest wall   10/31/2014 Initial Biopsy Lung biopsy right lower lobe: Carcinoma with Extensive necrosis, microscopic foci of nonnecrotic non-small cell, poorly differentiated carcinoma, metastatic disease likely ER 0%, PR 0%, HER-2 negative   12/01/2014 Procedure Second thoracentesis for malignant pleural effusion   12/06/2014 -  Chemotherapy Halaven day 1 day 8 every 3 weeks   02/11/2015 - 02/13/2015 Hospital Admission Hospitalization for gastroenteritis, accompanied by nausea vomiting  dehydration and hypokalemia    CHIEF COMPLIANT: Cycle 5 halaven  INTERVAL HISTORY: Christine [name] is a 59 year old with above-mentioned history of metastatic breast cancer with malignant pleural effusion currently on palliative chemotherapy with Halaven. She had profound constipation and uses no earwax and is very much relieved. She does not have any further nausea vomiting and her appetite has returned. She underwent PLEURODESIS. Her breathing continues to be slightly short of breath and uses oxygen. But the chronic cough that she had has resolved.  REVIEW OF SYSTEMS:   Constitutional: Denies fevers, chills or abnormal weight loss Eyes: Denies blurriness of vision Ears, nose, mouth, throat, and face: Denies mucositis or sore throat Respiratory: Improvement in the chronic cough, shortness of breath to even minimal exertion uses oxygen 24/7 Cardiovascular: Denies palpitation, chest discomfort or lower extremity swelling Gastrointestinal:  Denies nausea, heartburn or change in bowel habits Skin: Denies abnormal skin rashes Lymphatics: Denies new lymphadenopathy or easy bruising Neurological:Denies numbness, tingling or new weaknesses Behavioral/Psych: Mood is stable, no new changes   All other systems were reviewed with the patient and are negative.  I have reviewed the past medical history, past surgical history, social history and family history with the patient and they are unchanged from previous note.  ALLERGIES:  is allergic to sulfa antibiotics; sulfonamide derivatives; and tomato.  MEDICATIONS:  Current Outpatient Prescriptions  Medication Sig Dispense Refill  . cetirizine (ZYRTEC) 10 MG tablet Take 10 mg by mouth daily.    . cholecalciferol (VITAMIN D) 1000 UNITS tablet Take 1,000 Units by mouth every morning.     Marland Kitchen  diltiazem (CARDIZEM CD) 120 MG 24 hr capsule Take 1 capsule (120 mg total) by mouth every morning. 90 capsule 1  . esomeprazole (NEXIUM) 20 MG capsule Take 20 mg by  mouth daily.     . fluticasone (FLONASE) 50 MCG/ACT nasal spray Place 2 sprays into both nostrils daily as needed for allergies or rhinitis.    Marland Kitchen latanoprost (XALATAN) 0.005 % ophthalmic solution Place 1 drop into both eyes at bedtime.     Marland Kitchen levothyroxine (SYNTHROID, LEVOTHROID) 50 MCG tablet Take 1 tablet (50 mcg total) by mouth daily. 90 tablet 3  . lidocaine-prilocaine (EMLA) cream Apply to affected area once (Patient taking differently: Apply 1 application topically as needed (for chemo). Apply to affected area once) 30 g 3  . LORazepam (ATIVAN) 0.5 MG tablet Take 1 tablet (0.5 mg total) by mouth every 6 (six) hours as needed (Nausea or vomiting). 30 tablet 0  . metFORMIN (GLUCOPHAGE) 500 MG tablet Take 1050m (2 tabs) in the morning and 5070m(1 tab) in the evening with meals 270 tablet 3  . naproxen sodium (ANAPROX) 220 MG tablet Take 440 mg by mouth daily as needed (pain).    . NON FORMULARY Apply 1 application topically as needed (FUNGIFOAM - Applies to feet for athlete's foot.).     . Marland Kitchenndansetron (ZOFRAN) 8 MG tablet Take 1 tablet (8 mg total) by mouth 2 (two) times daily. Start the day after chemo for 2 days. Then take as needed for nausea or vomiting. 30 tablet 1  . oxyCODONE (OXY IR/ROXICODONE) 5 MG immediate release tablet Take 1 tablet (5 mg total) by mouth every 4 (four) hours as needed for moderate pain. 30 tablet 0  . potassium chloride SA (KLOR-CON M20) 20 MEQ tablet TAKE 1 TABLET (20 MEQ TOTAL) BY MOUTH 2 (TWO) TIMES DAILY. 60 tablet 0  . prochlorperazine (COMPAZINE) 10 MG tablet Take 1 tablet (10 mg total) by mouth every 6 (six) hours as needed (Nausea or vomiting). 30 tablet 1  . Tetrahydrozoline HCl (VISINE OP) Apply 1-2 drops to eye daily as needed (itchy eyes.).     No current facility-administered medications for this visit.   Facility-Administered Medications Ordered in Other Visits  Medication Dose Route Frequency Provider Last Rate Last Dose  . sodium chloride 0.9 %  injection 10 mL  10 mL Intravenous PRN ViNicholas LoseMD   10 mL at 02/28/15 0953    PHYSICAL EXAMINATION: ECOG PERFORMANCE STATUS: 1 - Symptomatic but completely ambulatory  Filed Vitals:   02/28/15 1006  BP: 138/69  Pulse: 106  Temp: 97.8 F (36.6 C)  Resp: 19   Filed Weights   02/28/15 1006  Weight: 187 lb 11.2 oz (85.14 kg)    GENERAL:alert, no distress and comfortable SKIN: skin color, texture, turgor are normal, no rashes or significant lesions EYES: normal, Conjunctiva are pink and non-injected, sclera clear OROPHARYNX:no exudate, no erythema and lips, buccal mucosa, and tongue normal  NECK: supple, thyroid normal size, non-tender, without nodularity LYMPH:  no palpable lymphadenopathy in the cervical, axillary or inguinal LUNGS: Diminished breath sounds at the right lung base with dullness to percussion HEART: regular rate & rhythm and no murmurs and no lower extremity edema ABDOMEN:abdomen soft, non-tender and normal bowel sounds Musculoskeletal:no cyanosis of digits and no clubbing  NEURO: alert & oriented x 3 with fluent speech, no focal motor/sensory deficits   LABORATORY DATA:  I have reviewed the data as listed   Chemistry      Component Value  Date/Time   NA 138 02/14/2015 1235   NA 136 02/13/2015 0433   K 3.8 02/14/2015 1235   K 3.8 02/13/2015 0433   CL 99* 02/13/2015 0433   CL 101 01/25/2013 0952   CO2 30* 02/14/2015 1235   CO2 30 02/13/2015 0433   BUN 6.9* 02/14/2015 1235   BUN <5* 02/13/2015 0433   CREATININE 0.8 02/14/2015 1235   CREATININE 0.57 02/13/2015 0433      Component Value Date/Time   CALCIUM 10.1 02/14/2015 1235   CALCIUM 8.3* 02/13/2015 0433   ALKPHOS 86 02/14/2015 1235   ALKPHOS 78 02/12/2015 0531   AST 15 02/14/2015 1235   AST 17 02/12/2015 0531   ALT 8 02/14/2015 1235   ALT 11* 02/12/2015 0531   BILITOT 0.62 02/14/2015 1235   BILITOT 1.1 02/12/2015 0531       Lab Results  Component Value Date   WBC 5.9 02/14/2015    HGB 10.3* 02/14/2015   HCT 32.0* 02/14/2015   MCV 90.7 02/14/2015   PLT 258 02/14/2015   NEUTROABS 4.2 02/14/2015    ASSESSMENT & PLAN:  Breast cancer of upper-outer quadrant of left female breast 09/30/2012 :Stage III, triple negative, invasive ductal carcinoma of the left breast. Neoadjuvant chemotherapy following the surgery followed by adjuvant radiation with concurrent Xeloda. 3.9 cm size and 9/13 lymph nodes were positive with extracapsular extension. ER/PR negative HER-2 negative Ki-67 21% March 2016: CT chest revealed bilateral lung nodules biopsy-proven to be triple negative metastatic carcinoma , pleural effusion status post thoracentesis malignant cells on cytology, repeat thoracentesis 12/01/2014 and 12/12/2014 PET CT scan 11/27/2014 showed widespread metastatic disease to the thorax with multiple pulmonary nodules and masses and malignant right pleural effusion and right hilar and mediastinal malignant lymph nodes. -------------------------------------------------------------------------------------------------------------------------------- Current Treatment: Cycle 5 day 8 Halaven (days 1,8 q 3 weeks) started 12/06/14 Shortness of breath: Related to malignant pleural effusion status post 3 thoracentesis. Pleurx catheter placed 01/02/2015 by Dr. Cyndia Bent.  Halaven toxicities: 1. Neutropenia: We started giving her Neulasta on day 8 of each cycle 2. Mild thrombocytopenia 3. Anemia grade 1 4. Reduced the dosage of Halaven from cycle 4 because of low WBC counts on day 8 5. Hospitalization 02/11/2015 for gastroenteritis probably related to food poisoning ([place])  I reviewed her blood counts and her symptoms. Her nausea vomiting and diarrhea have all resolved. She still has some abdominal discomfort and gassiness But we elected to continue and resume chemotherapy and she will get cycle 5 day 1 of chemotherapy today. Return to clinic with cycle 6 Then we will plan for PET/CT  scans.  No orders of the defined types were placed in this encounter.   The patient has a good understanding of the overall plan. she agrees with it. she will call with any problems that may develop before the next visit here.   Rulon Eisenmenger, MD

## 2015-03-07 ENCOUNTER — Ambulatory Visit (HOSPITAL_BASED_OUTPATIENT_CLINIC_OR_DEPARTMENT_OTHER): Payer: BLUE CROSS/BLUE SHIELD

## 2015-03-07 ENCOUNTER — Other Ambulatory Visit (HOSPITAL_BASED_OUTPATIENT_CLINIC_OR_DEPARTMENT_OTHER): Payer: BLUE CROSS/BLUE SHIELD

## 2015-03-07 VITALS — BP 137/70 | HR 102 | Temp 98.2°F | Resp 20

## 2015-03-07 DIAGNOSIS — Z5111 Encounter for antineoplastic chemotherapy: Secondary | ICD-10-CM

## 2015-03-07 DIAGNOSIS — D6959 Other secondary thrombocytopenia: Secondary | ICD-10-CM

## 2015-03-07 DIAGNOSIS — D701 Agranulocytosis secondary to cancer chemotherapy: Secondary | ICD-10-CM | POA: Diagnosis not present

## 2015-03-07 DIAGNOSIS — C50911 Malignant neoplasm of unspecified site of right female breast: Secondary | ICD-10-CM

## 2015-03-07 DIAGNOSIS — C50412 Malignant neoplasm of upper-outer quadrant of left female breast: Secondary | ICD-10-CM

## 2015-03-07 DIAGNOSIS — D6481 Anemia due to antineoplastic chemotherapy: Secondary | ICD-10-CM | POA: Diagnosis not present

## 2015-03-07 DIAGNOSIS — C50912 Malignant neoplasm of unspecified site of left female breast: Principal | ICD-10-CM

## 2015-03-07 LAB — CBC WITH DIFFERENTIAL/PLATELET
BASO%: 0.3 % (ref 0.0–2.0)
Basophils Absolute: 0 10*3/uL (ref 0.0–0.1)
EOS ABS: 0 10*3/uL (ref 0.0–0.5)
EOS%: 0.3 % (ref 0.0–7.0)
HCT: 31.3 % — ABNORMAL LOW (ref 34.8–46.6)
HGB: 10.1 g/dL — ABNORMAL LOW (ref 11.6–15.9)
LYMPH%: 34.9 % (ref 14.0–49.7)
MCH: 30.2 pg (ref 25.1–34.0)
MCHC: 32.3 g/dL (ref 31.5–36.0)
MCV: 93.6 fL (ref 79.5–101.0)
MONO#: 0.3 10*3/uL (ref 0.1–0.9)
MONO%: 9.4 % (ref 0.0–14.0)
NEUT%: 55.1 % (ref 38.4–76.8)
NEUTROS ABS: 1.6 10*3/uL (ref 1.5–6.5)
PLATELETS: 187 10*3/uL (ref 145–400)
RBC: 3.35 10*6/uL — ABNORMAL LOW (ref 3.70–5.45)
RDW: 22.7 % — ABNORMAL HIGH (ref 11.2–14.5)
WBC: 3 10*3/uL — ABNORMAL LOW (ref 3.9–10.3)
lymph#: 1 10*3/uL (ref 0.9–3.3)

## 2015-03-07 LAB — COMPREHENSIVE METABOLIC PANEL (CC13)
ALT: 20 U/L (ref 0–55)
ANION GAP: 8 meq/L (ref 3–11)
AST: 25 U/L (ref 5–34)
Albumin: 3.8 g/dL (ref 3.5–5.0)
Alkaline Phosphatase: 79 U/L (ref 40–150)
BILIRUBIN TOTAL: 0.62 mg/dL (ref 0.20–1.20)
BUN: 8.3 mg/dL (ref 7.0–26.0)
CHLORIDE: 108 meq/L (ref 98–109)
CO2: 27 meq/L (ref 22–29)
CREATININE: 0.7 mg/dL (ref 0.6–1.1)
Calcium: 9.3 mg/dL (ref 8.4–10.4)
EGFR: >90 mL/min/{1.73_m2} (ref >90)
GLUCOSE: 141 mg/dL — AB (ref 70–140)
POTASSIUM: 3.3 meq/L — AB (ref 3.5–5.1)
SODIUM: 144 meq/L (ref 136–145)
TOTAL PROTEIN: 6.4 g/dL (ref 6.4–8.3)

## 2015-03-07 MED ORDER — SODIUM CHLORIDE 0.9 % IV SOLN
Freq: Once | INTRAVENOUS | Status: AC
  Administered 2015-03-07: 10:00:00 via INTRAVENOUS
  Filled 2015-03-07: qty 4

## 2015-03-07 MED ORDER — SODIUM CHLORIDE 0.9 % IV SOLN
1.2000 mg/m2 | Freq: Once | INTRAVENOUS | Status: AC
  Administered 2015-03-07: 2.55 mg via INTRAVENOUS
  Filled 2015-03-07: qty 5.1

## 2015-03-07 MED ORDER — SODIUM CHLORIDE 0.9 % IV SOLN
Freq: Once | INTRAVENOUS | Status: AC
  Administered 2015-03-07: 10:00:00 via INTRAVENOUS

## 2015-03-07 MED ORDER — SODIUM CHLORIDE 0.9 % IJ SOLN
10.0000 mL | INTRAMUSCULAR | Status: DC | PRN
  Administered 2015-03-07: 10 mL
  Filled 2015-03-07: qty 10

## 2015-03-07 MED ORDER — HEPARIN SOD (PORK) LOCK FLUSH 100 UNIT/ML IV SOLN
500.0000 [IU] | Freq: Once | INTRAVENOUS | Status: AC | PRN
  Administered 2015-03-07: 500 [IU]
  Filled 2015-03-07: qty 5

## 2015-03-07 MED ORDER — PEGFILGRASTIM 6 MG/0.6ML ~~LOC~~ PSKT
6.0000 mg | PREFILLED_SYRINGE | Freq: Once | SUBCUTANEOUS | Status: AC
  Administered 2015-03-07: 6 mg via SUBCUTANEOUS
  Filled 2015-03-07: qty 0.6

## 2015-03-07 NOTE — Patient Instructions (Signed)
[facility] Discharge Instructions for Patients Receiving Chemotherapy  Today you received the following chemotherapy agents:  Halaven  To help prevent nausea and vomiting after your treatment, we encourage you to take your nausea medication as ordered per MD.   If you develop nausea and vomiting that is not controlled by your nausea medication, call the clinic.   BELOW ARE SYMPTOMS THAT SHOULD BE REPORTED IMMEDIATELY:  *FEVER GREATER THAN 100.5 F  *CHILLS WITH OR WITHOUT FEVER  NAUSEA AND VOMITING THAT IS NOT CONTROLLED WITH YOUR NAUSEA MEDICATION  *UNUSUAL SHORTNESS OF BREATH  *UNUSUAL BRUISING OR BLEEDING  TENDERNESS IN MOUTH AND THROAT WITH OR WITHOUT PRESENCE OF ULCERS  *URINARY PROBLEMS  *BOWEL PROBLEMS  UNUSUAL RASH Items with * indicate a potential emergency and should be followed up as soon as possible.  Feel free to call the clinic you have any questions or concerns. The clinic phone number is [phone].  Please show the [facility] at check-in to the Emergency Department and triage nurse.

## 2015-03-08 ENCOUNTER — Telehealth: Payer: Self-pay | Admitting: Hematology and Oncology

## 2015-03-08 NOTE — Telephone Encounter (Signed)
Patient called in to go ahead and add her last tx,went ahead and added heather as well    anne

## 2015-03-12 LAB — HM DIABETES EYE EXAM

## 2015-03-20 ENCOUNTER — Ambulatory Visit: Payer: BLUE CROSS/BLUE SHIELD | Admitting: Family Medicine

## 2015-03-21 ENCOUNTER — Ambulatory Visit (HOSPITAL_BASED_OUTPATIENT_CLINIC_OR_DEPARTMENT_OTHER): Payer: BLUE CROSS/BLUE SHIELD | Admitting: Nurse Practitioner

## 2015-03-21 ENCOUNTER — Telehealth: Payer: Self-pay | Admitting: Nurse Practitioner

## 2015-03-21 ENCOUNTER — Ambulatory Visit: Payer: BLUE CROSS/BLUE SHIELD

## 2015-03-21 ENCOUNTER — Other Ambulatory Visit (HOSPITAL_BASED_OUTPATIENT_CLINIC_OR_DEPARTMENT_OTHER): Payer: BLUE CROSS/BLUE SHIELD

## 2015-03-21 ENCOUNTER — Ambulatory Visit (HOSPITAL_BASED_OUTPATIENT_CLINIC_OR_DEPARTMENT_OTHER): Payer: BLUE CROSS/BLUE SHIELD

## 2015-03-21 ENCOUNTER — Encounter: Payer: Self-pay | Admitting: Nurse Practitioner

## 2015-03-21 ENCOUNTER — Other Ambulatory Visit: Payer: BLUE CROSS/BLUE SHIELD

## 2015-03-21 VITALS — BP 145/75 | HR 91 | Temp 98.2°F | Resp 18 | Ht 64.0 in | Wt 185.4 lb

## 2015-03-21 DIAGNOSIS — R51 Headache: Secondary | ICD-10-CM | POA: Diagnosis not present

## 2015-03-21 DIAGNOSIS — C50412 Malignant neoplasm of upper-outer quadrant of left female breast: Secondary | ICD-10-CM

## 2015-03-21 DIAGNOSIS — C7801 Secondary malignant neoplasm of right lung: Secondary | ICD-10-CM

## 2015-03-21 DIAGNOSIS — Z5111 Encounter for antineoplastic chemotherapy: Secondary | ICD-10-CM | POA: Diagnosis not present

## 2015-03-21 DIAGNOSIS — C50912 Malignant neoplasm of unspecified site of left female breast: Principal | ICD-10-CM

## 2015-03-21 DIAGNOSIS — C50911 Malignant neoplasm of unspecified site of right female breast: Secondary | ICD-10-CM

## 2015-03-21 DIAGNOSIS — Z95828 Presence of other vascular implants and grafts: Secondary | ICD-10-CM

## 2015-03-21 DIAGNOSIS — R519 Headache, unspecified: Secondary | ICD-10-CM

## 2015-03-21 LAB — COMPREHENSIVE METABOLIC PANEL (CC13)
ALT: 16 U/L (ref 0–55)
AST: 19 U/L (ref 5–34)
Albumin: 4 g/dL (ref 3.5–5.0)
Alkaline Phosphatase: 94 U/L (ref 40–150)
Anion Gap: 11 mEq/L (ref 3–11)
BUN: 11.3 mg/dL (ref 7.0–26.0)
CALCIUM: 8.5 mg/dL (ref 8.4–10.4)
CHLORIDE: 108 meq/L (ref 98–109)
CO2: 24 mEq/L (ref 22–29)
Creatinine: 0.8 mg/dL (ref 0.6–1.1)
Glucose: 140 mg/dl (ref 70–140)
POTASSIUM: 3.8 meq/L (ref 3.5–5.1)
Sodium: 143 mEq/L (ref 136–145)
Total Bilirubin: 0.54 mg/dL (ref 0.20–1.20)
Total Protein: 6.7 g/dL (ref 6.4–8.3)

## 2015-03-21 LAB — CBC WITH DIFFERENTIAL/PLATELET
BASO%: 0.2 % (ref 0.0–2.0)
Basophils Absolute: 0 10*3/uL (ref 0.0–0.1)
EOS%: 0.2 % (ref 0.0–7.0)
Eosinophils Absolute: 0 10*3/uL (ref 0.0–0.5)
HEMATOCRIT: 36.1 % (ref 34.8–46.6)
HGB: 11.4 g/dL — ABNORMAL LOW (ref 11.6–15.9)
LYMPH%: 22.5 % (ref 14.0–49.7)
MCH: 30.9 pg (ref 25.1–34.0)
MCHC: 31.6 g/dL (ref 31.5–36.0)
MCV: 97.8 fL (ref 79.5–101.0)
MONO#: 0.5 10*3/uL (ref 0.1–0.9)
MONO%: 7.3 % (ref 0.0–14.0)
NEUT#: 4.5 10*3/uL (ref 1.5–6.5)
NEUT%: 69.8 % (ref 38.4–76.8)
Platelets: 168 10*3/uL (ref 145–400)
RBC: 3.69 10*6/uL — ABNORMAL LOW (ref 3.70–5.45)
RDW: 20.7 % — ABNORMAL HIGH (ref 11.2–14.5)
WBC: 6.5 10*3/uL (ref 3.9–10.3)
lymph#: 1.5 10*3/uL (ref 0.9–3.3)

## 2015-03-21 MED ORDER — SODIUM CHLORIDE 0.9 % IV SOLN
Freq: Once | INTRAVENOUS | Status: AC
  Administered 2015-03-21: 10:00:00 via INTRAVENOUS

## 2015-03-21 MED ORDER — SODIUM CHLORIDE 0.9 % IV SOLN
Freq: Once | INTRAVENOUS | Status: AC
  Administered 2015-03-21: 10:00:00 via INTRAVENOUS
  Filled 2015-03-21: qty 4

## 2015-03-21 MED ORDER — SODIUM CHLORIDE 0.9 % IJ SOLN
10.0000 mL | INTRAMUSCULAR | Status: DC | PRN
  Administered 2015-03-21: 10 mL via INTRAVENOUS
  Filled 2015-03-21: qty 10

## 2015-03-21 MED ORDER — SODIUM CHLORIDE 0.9 % IJ SOLN
10.0000 mL | INTRAMUSCULAR | Status: DC | PRN
  Administered 2015-03-21: 10 mL
  Filled 2015-03-21: qty 10

## 2015-03-21 MED ORDER — SODIUM CHLORIDE 0.9 % IV SOLN
1.2000 mg/m2 | Freq: Once | INTRAVENOUS | Status: AC
  Administered 2015-03-21: 2.55 mg via INTRAVENOUS
  Filled 2015-03-21: qty 5.1

## 2015-03-21 MED ORDER — HEPARIN SOD (PORK) LOCK FLUSH 100 UNIT/ML IV SOLN
500.0000 [IU] | Freq: Once | INTRAVENOUS | Status: AC | PRN
  Administered 2015-03-21: 500 [IU]
  Filled 2015-03-21: qty 5

## 2015-03-21 NOTE — Patient Instructions (Signed)
[facility] Discharge Instructions for Patients Receiving Chemotherapy  Today you received the following chemotherapy agents: Halaven  To help prevent nausea and vomiting after your treatment, we encourage you to take your nausea medication as directed.    If you develop nausea and vomiting that is not controlled by your nausea medication, call the clinic.   BELOW ARE SYMPTOMS THAT SHOULD BE REPORTED IMMEDIATELY:  *FEVER GREATER THAN 100.5 F  *CHILLS WITH OR WITHOUT FEVER  NAUSEA AND VOMITING THAT IS NOT CONTROLLED WITH YOUR NAUSEA MEDICATION  *UNUSUAL SHORTNESS OF BREATH  *UNUSUAL BRUISING OR BLEEDING  TENDERNESS IN MOUTH AND THROAT WITH OR WITHOUT PRESENCE OF ULCERS  *URINARY PROBLEMS  *BOWEL PROBLEMS  UNUSUAL RASH Items with * indicate a potential emergency and should be followed up as soon as possible.  Feel free to call the clinic you have any questions or concerns. The clinic phone number is [phone].  Please show the CHEMO ALERT CARD at check-in to the Emergency Department and triage nurse.   

## 2015-03-21 NOTE — Progress Notes (Signed)
Patient Care Team: Lucretia Kern, DO as PCP - General (Family Medicine)  DIAGNOSIS: Breast cancer of upper-outer quadrant of left female breast   Staging form: Breast, AJCC 7th Edition     Clinical: Stage IIIB (T4, N1, cM0) - Signed by Thea Silversmith, MD on 10/29/2012       Prognostic indicators: ER-, PR- HER2 -      Pathologic: No stage assigned - Unsigned       Prognostic indicators: ER-, PR- HER2 -    SUMMARY OF ONCOLOGIC HISTORY:   Breast cancer of upper-outer quadrant of left female breast   10/06/1986 Initial Diagnosis Right breast mastectomy, breast cancer diagnosed during pregnancy, no adjuvant treatment given   09/30/2012 Initial Biopsy Left breast biopsy done for left nipple retraction: Invasive ductal carcinoma grade 3, ER/PR negative, HER-2 negative, Ki-67 21% with grade 3 DCIS   10/12/2012 Breast MRI Left breast diffuse enhancement measuring 13 x 12.5 x 9.5 cm with diffuse skin thickening and dermal enhancement, multiple enlarged level I left axillary lymph nodes largest 2.4 cm   11/02/2012 - 06/06/2013 Neo-Adjuvant Chemotherapy Neoadjuvant FEC 4 followed by Taxol carbo 8 discontinued for neuropathy, Gemzar Carbo 2   09/15/2013 - 11/01/2013 Radiation Therapy Xeloda with adjuvant radiation   10/30/2014 Imaging Right hilar node 1.6 cm, subcarinal 1.2 cm, 1 cm additional right hilar, right lower lobe 3.2 cm, RUL 1 cm, and LDL of 1.3 cm, moderate pleural effusion, right liver 0.8 cm, 15 cm fluid collection left chest wall   10/31/2014 Initial Biopsy Lung biopsy right lower lobe: Carcinoma with Extensive necrosis, microscopic foci of nonnecrotic non-small cell, poorly differentiated carcinoma, metastatic disease likely ER 0%, PR 0%, HER-2 negative   12/01/2014 Procedure Second thoracentesis for malignant pleural effusion   12/06/2014 -  Chemotherapy Halaven day 1 day 8 every 3 weeks   02/11/2015 - 02/13/2015 Hospital Admission Hospitalization for gastroenteritis, accompanied by nausea vomiting  dehydration and hypokalemia    CHIEF COMPLIANT: Cycle 6 halaven  INTERVAL HISTORY: Christine [name] is a 59 year old with above-mentioned history of metastatic breast cancer with malignant pleural effusion currently on palliative chemotherapy with Halaven. Today she is due for cycle 6. She tolerates this well since the dose reduction with the last cycle. Her nausea is minimal. Her stools are loose, but not too frequent. She is eating well. She is breathing well, without the support of oxygen today. A new complaint this week are headaches. Prior to these headaches sometime she has nausea or vision changes. She can be sensitive to light.They are resolved with aleve. She visited her ophthalmologist who documented increased eye pressure and she was prescribed timolol drops. These headaches occur independent of zofran use.   REVIEW OF SYSTEMS:   Constitutional: Denies fevers, chills or abnormal weight loss Eyes: Denies blurriness of vision Ears, nose, mouth, throat, and face: Denies mucositis or sore throat Respiratory: Improvement in the chronic cough, shortness of breath to even minimal exertion uses oxygen 24/7 Cardiovascular: Denies palpitation, chest discomfort or lower extremity swelling Gastrointestinal:  Denies nausea, heartburn or change in bowel habits Skin: Denies abnormal skin rashes Lymphatics: Denies new lymphadenopathy or easy bruising Neurological:Denies numbness, tingling or new weaknesses Behavioral/Psych: Mood is stable, no new changes   All other systems were reviewed with the patient and are negative.  I have reviewed the past medical history, past surgical history, social history and family history with the patient and they are unchanged from previous note.  ALLERGIES:  is allergic to sulfa antibiotics; sulfonamide  derivatives; and tomato.  MEDICATIONS:  Current Outpatient Prescriptions  Medication Sig Dispense Refill  . cetirizine (ZYRTEC) 10 MG tablet Take 10 mg by mouth  daily.    . cholecalciferol (VITAMIN D) 1000 UNITS tablet Take 1,000 Units by mouth every morning.     . diltiazem (CARDIZEM CD) 120 MG 24 hr capsule Take 1 capsule (120 mg total) by mouth every morning. 90 capsule 1  . esomeprazole (NEXIUM) 20 MG capsule Take 20 mg by mouth daily.     . fluticasone (FLONASE) 50 MCG/ACT nasal spray Place 2 sprays into both nostrils daily as needed for allergies or rhinitis.    Marland Kitchen latanoprost (XALATAN) 0.005 % ophthalmic solution Place 1 drop into both eyes at bedtime.     Marland Kitchen levothyroxine (SYNTHROID, LEVOTHROID) 50 MCG tablet Take 1 tablet (50 mcg total) by mouth daily. 90 tablet 3  . lidocaine-prilocaine (EMLA) cream Apply to affected area once (Patient taking differently: Apply 1 application topically as needed (for chemo). Apply to affected area once) 30 g 3  . LORazepam (ATIVAN) 0.5 MG tablet Take 1 tablet (0.5 mg total) by mouth every 6 (six) hours as needed (Nausea or vomiting). 30 tablet 0  . metFORMIN (GLUCOPHAGE) 500 MG tablet Take $RemoveBef'1000mg'[id number]$  (2 tabs) in the morning and $RemoveBef'500mg'[id number]$  (1 tab) in the evening with meals 270 tablet 3  . naproxen sodium (ANAPROX) 220 MG tablet Take 440 mg by mouth daily as needed (pain).    . NON FORMULARY Apply 1 application topically as needed (FUNGIFOAM - Applies to feet for athlete's foot.).     Marland Kitchen ondansetron (ZOFRAN) 8 MG tablet Take 1 tablet (8 mg total) by mouth 2 (two) times daily. Start the day after chemo for 2 days. Then take as needed for nausea or vomiting. 30 tablet 1  . potassium chloride SA (KLOR-CON M20) 20 MEQ tablet TAKE 1 TABLET (20 MEQ TOTAL) BY MOUTH 2 (TWO) TIMES DAILY. 60 tablet 0  . Tetrahydrozoline HCl (VISINE OP) Apply 1-2 drops to eye daily as needed (itchy eyes.).    Marland Kitchen timolol (TIMOPTIC) 0.5 % ophthalmic solution Place 1 drop into both eyes 2 (two) times daily.    Marland Kitchen oxyCODONE (OXY IR/ROXICODONE) 5 MG immediate release tablet Take 1 tablet (5 mg total) by mouth every 4 (four) hours as needed for moderate pain.  (Patient not taking: Reported on 03/21/2015) 30 tablet 0  . prochlorperazine (COMPAZINE) 10 MG tablet Take 1 tablet (10 mg total) by mouth every 6 (six) hours as needed (Nausea or vomiting). (Patient not taking: Reported on 03/21/2015) 30 tablet 1   No current facility-administered medications for this visit.    PHYSICAL EXAMINATION: ECOG PERFORMANCE STATUS: 1 - Symptomatic but completely ambulatory  Filed Vitals:   03/21/15 0916  BP: 145/75  Pulse: 91  Temp: 98.2 F (36.8 C)  Resp: 18   Filed Weights   03/21/15 0916  Weight: 185 lb 6.4 oz (84.097 kg)    GENERAL:alert, no distress and comfortable SKIN: skin color, texture, turgor are normal, no rashes or significant lesions EYES: normal, Conjunctiva are pink and non-injected, sclera clear OROPHARYNX:no exudate, no erythema and lips, buccal mucosa, and tongue normal  NECK: supple, thyroid normal size, non-tender, without nodularity LYMPH:  no palpable lymphadenopathy in the cervical, axillary or inguinal LUNGS: Diminished breath sounds at the right lung base with dullness to percussion HEART: regular rate & rhythm and no murmurs and no lower extremity edema ABDOMEN:abdomen soft, non-tender and normal bowel sounds Musculoskeletal:no cyanosis of digits  and no clubbing  NEURO: alert & oriented x 3 with fluent speech, no focal motor/sensory deficits   LABORATORY DATA:  I have reviewed the data as listed   Chemistry      Component Value Date/Time   NA 143 03/21/2015 0810   NA 136 02/13/2015 0433   K 3.8 03/21/2015 0810   K 3.8 02/13/2015 0433   CL 99* 02/13/2015 0433   CL 101 01/25/2013 0952   CO2 24 03/21/2015 0810   CO2 30 02/13/2015 0433   BUN 11.3 03/21/2015 0810   BUN <5* 02/13/2015 0433   CREATININE 0.8 03/21/2015 0810   CREATININE 0.57 02/13/2015 0433      Component Value Date/Time   CALCIUM 8.5 03/21/2015 0810   CALCIUM 8.3* 02/13/2015 0433   ALKPHOS 94 03/21/2015 0810   ALKPHOS 78 02/12/2015 0531   AST 19  03/21/2015 0810   AST 17 02/12/2015 0531   ALT 16 03/21/2015 0810   ALT 11* 02/12/2015 0531   BILITOT 0.54 03/21/2015 0810   BILITOT 1.1 02/12/2015 0531       Lab Results  Component Value Date   WBC 6.5 03/21/2015   HGB 11.4* 03/21/2015   HCT 36.1 03/21/2015   MCV 97.8 03/21/2015   PLT 168 03/21/2015   NEUTROABS 4.5 03/21/2015    ASSESSMENT & PLAN:  Breast cancer of upper-outer quadrant of left female breast 09/30/2012 :Stage III, triple negative, invasive ductal carcinoma of the left breast. Neoadjuvant chemotherapy following the surgery followed by adjuvant radiation with concurrent Xeloda. 3.9 cm size and 9/13 lymph nodes were positive with extracapsular extension. ER/PR negative HER-2 negative Ki-67 21% March 2016: CT chest revealed bilateral lung nodules biopsy-proven to be triple negative metastatic carcinoma , pleural effusion status post thoracentesis malignant cells on cytology, repeat thoracentesis 12/01/2014 and 12/12/2014 PET CT scan 11/27/2014 showed widespread metastatic disease to the thorax with multiple pulmonary nodules and masses and malignant right pleural effusion and right hilar and mediastinal malignant lymph nodes. -------------------------------------------------------------------------------------------------------------------------------- Current Treatment: Cycle 5 day 8 Halaven (days 1,8 q 3 weeks) started 12/06/14 Shortness of breath: Related to malignant pleural effusion status post 3 thoracentesis. Pleurx catheter placed 01/02/2015 by Dr. Cyndia Bent.  Halaven toxicities: 1. Neutropenia: We started giving her Neulasta on day 8 of each cycle 2. Mild thrombocytopenia 3. Anemia grade 1 4. Reduced the dosage of Halaven from cycle 4 because of low WBC counts on day 8 5. Hospitalization 02/11/2015 for gastroenteritis probably related to food poisoning ([place]) 6. Headaches: resolved with aleve. unrelated to zofran use. Possibly migraines with aura due to  vision changes and light sensitivity. Increased eye pressure per ophthalmologist. Continue to monitor.  7. Peripheral neuropathy: stable  Jazell is doing well today. The labs were reviewed in detail and were stable. She will proceed with cycle 6, day 1 of eribulin as planned today.  She will have a PET scan at the completion of this cycle. We will continue to monitor her headaches. I have ordered a brain MRI for early Sept with the PET scan. If negative, likely just migraines with visual aura. Patient will return in 1 week with cycle 8 of treatment.   Orders Placed This Encounter  Procedures  . MR Brain W Wo Contrast    Standing Status: Future     Number of Occurrences:      Standing Expiration Date: 03/20/2016    Order Specific Question:  Reason for Exam (SYMPTOM  OR DIAGNOSIS REQUIRED)    Answer:  metastatic breast cancer  Order Specific Question:  Preferred imaging location?    Answer:  [facility]    Order Specific Question:  Does the patient have a pacemaker or implanted devices?    Answer:  No    Order Specific Question:  What is the patient's sedation requirement?    Answer:  No Sedation  . NM PET Image Restag (PS) Skull Base To Thigh    Standing Status: Future     Number of Occurrences:      Standing Expiration Date: 03/20/2016    Order Specific Question:  Reason for Exam (SYMPTOM  OR DIAGNOSIS REQUIRED)    Answer:  metastatic breast cancer    Order Specific Question:  Is the patient pregnant?    Answer:  No    Order Specific Question:  Preferred imaging location?    Answer:  [facility]   The patient has a good understanding of the overall plan. she agrees with it. she will call with any problems that may develop before the next visit here.   Laurie Panda, NP

## 2015-03-21 NOTE — Patient Instructions (Signed)

## 2015-03-21 NOTE — Telephone Encounter (Signed)
Appointments made and avs printed for patient,and per patient her last tx is 8/24

## 2015-03-23 ENCOUNTER — Encounter: Payer: Self-pay | Admitting: Family Medicine

## 2015-03-27 ENCOUNTER — Ambulatory Visit: Payer: BLUE CROSS/BLUE SHIELD | Admitting: Hematology and Oncology

## 2015-03-27 ENCOUNTER — Other Ambulatory Visit: Payer: BLUE CROSS/BLUE SHIELD

## 2015-03-28 ENCOUNTER — Other Ambulatory Visit (HOSPITAL_BASED_OUTPATIENT_CLINIC_OR_DEPARTMENT_OTHER): Payer: BLUE CROSS/BLUE SHIELD

## 2015-03-28 ENCOUNTER — Ambulatory Visit (HOSPITAL_BASED_OUTPATIENT_CLINIC_OR_DEPARTMENT_OTHER): Payer: BLUE CROSS/BLUE SHIELD

## 2015-03-28 ENCOUNTER — Ambulatory Visit (HOSPITAL_BASED_OUTPATIENT_CLINIC_OR_DEPARTMENT_OTHER): Payer: BLUE CROSS/BLUE SHIELD | Admitting: Nurse Practitioner

## 2015-03-28 ENCOUNTER — Encounter: Payer: Self-pay | Admitting: Nurse Practitioner

## 2015-03-28 ENCOUNTER — Telehealth: Payer: Self-pay | Admitting: Hematology and Oncology

## 2015-03-28 VITALS — BP 147/73 | HR 101 | Temp 98.1°F | Resp 18 | Ht 64.0 in | Wt 186.6 lb

## 2015-03-28 DIAGNOSIS — D6481 Anemia due to antineoplastic chemotherapy: Secondary | ICD-10-CM

## 2015-03-28 DIAGNOSIS — C50412 Malignant neoplasm of upper-outer quadrant of left female breast: Secondary | ICD-10-CM

## 2015-03-28 DIAGNOSIS — C7801 Secondary malignant neoplasm of right lung: Secondary | ICD-10-CM

## 2015-03-28 DIAGNOSIS — C50912 Malignant neoplasm of unspecified site of left female breast: Principal | ICD-10-CM

## 2015-03-28 DIAGNOSIS — C7802 Secondary malignant neoplasm of left lung: Secondary | ICD-10-CM | POA: Diagnosis not present

## 2015-03-28 DIAGNOSIS — C773 Secondary and unspecified malignant neoplasm of axilla and upper limb lymph nodes: Secondary | ICD-10-CM

## 2015-03-28 DIAGNOSIS — D701 Agranulocytosis secondary to cancer chemotherapy: Secondary | ICD-10-CM | POA: Diagnosis not present

## 2015-03-28 DIAGNOSIS — J910 Malignant pleural effusion: Secondary | ICD-10-CM

## 2015-03-28 DIAGNOSIS — D6959 Other secondary thrombocytopenia: Secondary | ICD-10-CM | POA: Diagnosis not present

## 2015-03-28 DIAGNOSIS — C50911 Malignant neoplasm of unspecified site of right female breast: Secondary | ICD-10-CM

## 2015-03-28 DIAGNOSIS — Z5111 Encounter for antineoplastic chemotherapy: Secondary | ICD-10-CM | POA: Diagnosis not present

## 2015-03-28 DIAGNOSIS — Z171 Estrogen receptor negative status [ER-]: Secondary | ICD-10-CM

## 2015-03-28 LAB — CBC WITH DIFFERENTIAL/PLATELET
BASO%: 0.6 % (ref 0.0–2.0)
Basophils Absolute: 0 10*3/uL (ref 0.0–0.1)
EOS%: 0.6 % (ref 0.0–7.0)
Eosinophils Absolute: 0 10*3/uL (ref 0.0–0.5)
HCT: 34.6 % — ABNORMAL LOW (ref 34.8–46.6)
HEMOGLOBIN: 11.2 g/dL — AB (ref 11.6–15.9)
LYMPH%: 35 % (ref 14.0–49.7)
MCH: 31 pg (ref 25.1–34.0)
MCHC: 32.4 g/dL (ref 31.5–36.0)
MCV: 95.8 fL (ref 79.5–101.0)
MONO#: 0.4 10*3/uL (ref 0.1–0.9)
MONO%: 11.4 % (ref 0.0–14.0)
NEUT%: 52.4 % (ref 38.4–76.8)
NEUTROS ABS: 1.8 10*3/uL (ref 1.5–6.5)
PLATELETS: 174 10*3/uL (ref 145–400)
RBC: 3.61 10*6/uL — AB (ref 3.70–5.45)
RDW: 19.1 % — ABNORMAL HIGH (ref 11.2–14.5)
WBC: 3.5 10*3/uL — ABNORMAL LOW (ref 3.9–10.3)
lymph#: 1.2 10*3/uL (ref 0.9–3.3)

## 2015-03-28 LAB — COMPREHENSIVE METABOLIC PANEL (CC13)
ALT: 21 U/L (ref 0–55)
ANION GAP: 12 meq/L — AB (ref 3–11)
AST: 26 U/L (ref 5–34)
Albumin: 4 g/dL (ref 3.5–5.0)
Alkaline Phosphatase: 81 U/L (ref 40–150)
BILIRUBIN TOTAL: 0.65 mg/dL (ref 0.20–1.20)
BUN: 11.3 mg/dL (ref 7.0–26.0)
CO2: 27 meq/L (ref 22–29)
CREATININE: 0.7 mg/dL (ref 0.6–1.1)
Calcium: 9.6 mg/dL (ref 8.4–10.4)
Chloride: 105 mEq/L (ref 98–109)
EGFR: >90 mL/min/{1.73_m2} (ref >90)
GLUCOSE: 101 mg/dL (ref 70–140)
Potassium: 3.3 mEq/L — ABNORMAL LOW (ref 3.5–5.1)
SODIUM: 145 meq/L (ref 136–145)
TOTAL PROTEIN: 6.8 g/dL (ref 6.4–8.3)

## 2015-03-28 MED ORDER — SODIUM CHLORIDE 0.9 % IV SOLN
Freq: Once | INTRAVENOUS | Status: AC
  Administered 2015-03-28: 14:00:00 via INTRAVENOUS

## 2015-03-28 MED ORDER — PEGFILGRASTIM 6 MG/0.6ML ~~LOC~~ PSKT
6.0000 mg | PREFILLED_SYRINGE | Freq: Once | SUBCUTANEOUS | Status: AC
Start: 2015-03-28 — End: 2015-03-28
  Administered 2015-03-28: 6 mg via SUBCUTANEOUS
  Filled 2015-03-28: qty 0.6

## 2015-03-28 MED ORDER — ERIBULIN MESYLATE CHEMO INJECTION 1 MG/2ML
1.2000 mg/m2 | Freq: Once | INTRAVENOUS | Status: AC
  Administered 2015-03-28: 2.55 mg via INTRAVENOUS
  Filled 2015-03-28: qty 5.1

## 2015-03-28 MED ORDER — SODIUM CHLORIDE 0.9 % IV SOLN
Freq: Once | INTRAVENOUS | Status: AC
  Administered 2015-03-28: 14:00:00 via INTRAVENOUS
  Filled 2015-03-28: qty 4

## 2015-03-28 MED ORDER — SODIUM CHLORIDE 0.9 % IJ SOLN
10.0000 mL | INTRAMUSCULAR | Status: DC | PRN
  Administered 2015-03-28: 10 mL
  Filled 2015-03-28: qty 10

## 2015-03-28 MED ORDER — HEPARIN SOD (PORK) LOCK FLUSH 100 UNIT/ML IV SOLN
500.0000 [IU] | Freq: Once | INTRAVENOUS | Status: AC | PRN
  Administered 2015-03-28: 500 [IU]
  Filled 2015-03-28: qty 5

## 2015-03-28 NOTE — Telephone Encounter (Signed)
Appointments made and patient will get a new avs in chemo °

## 2015-03-28 NOTE — Progress Notes (Signed)
Patient Care Team: Lucretia Kern, DO as PCP - General (Family Medicine)  DIAGNOSIS: Breast cancer of upper-outer quadrant of left female breast   Staging form: Breast, AJCC 7th Edition     Clinical: Stage IIIB (T4, N1, cM0) - Signed by Thea Silversmith, MD on 10/29/2012       Prognostic indicators: ER-, PR- HER2 -      Pathologic: No stage assigned - Unsigned       Prognostic indicators: ER-, PR- HER2 -    SUMMARY OF ONCOLOGIC HISTORY:   Breast cancer of upper-outer quadrant of left female breast   10/06/1986 Initial Diagnosis Right breast mastectomy, breast cancer diagnosed during pregnancy, no adjuvant treatment given   09/30/2012 Initial Biopsy Left breast biopsy done for left nipple retraction: Invasive ductal carcinoma grade 3, ER/PR negative, HER-2 negative, Ki-67 21% with grade 3 DCIS   10/12/2012 Breast MRI Left breast diffuse enhancement measuring 13 x 12.5 x 9.5 cm with diffuse skin thickening and dermal enhancement, multiple enlarged level I left axillary lymph nodes largest 2.4 cm   11/02/2012 - 06/06/2013 Neo-Adjuvant Chemotherapy Neoadjuvant FEC 4 followed by Taxol carbo 8 discontinued for neuropathy, Gemzar Carbo 2   09/15/2013 - 11/01/2013 Radiation Therapy Xeloda with adjuvant radiation   10/30/2014 Imaging Right hilar node 1.6 cm, subcarinal 1.2 cm, 1 cm additional right hilar, right lower lobe 3.2 cm, RUL 1 cm, and LDL of 1.3 cm, moderate pleural effusion, right liver 0.8 cm, 15 cm fluid collection left chest wall   10/31/2014 Initial Biopsy Lung biopsy right lower lobe: Carcinoma with Extensive necrosis, microscopic foci of nonnecrotic non-small cell, poorly differentiated carcinoma, metastatic disease likely ER 0%, PR 0%, HER-2 negative   12/01/2014 Procedure Second thoracentesis for malignant pleural effusion   12/06/2014 -  Chemotherapy Halaven day 1 day 8 every 3 weeks   02/11/2015 - 02/13/2015 Hospital Admission Hospitalization for gastroenteritis, accompanied by nausea vomiting  dehydration and hypokalemia    CHIEF COMPLIANT: Cycle 6 halaven  INTERVAL HISTORY: Christine [name] is a 59 year old with above-mentioned history of metastatic breast cancer with malignant pleural effusion currently on palliative chemotherapy with Halaven. Today she is due for cycle 6, day 8 of treatment today.. She tolerates this well since the dose reduction with the last cycle. Her nausea is minimal. She thinks her loose stools have more to do with her metformin and this dose has recently been reduced. Her neuropathy is stable. She is breathing well, without the support of oxygen today. She is still having the headaches with vision changes, which was a new find at her last visit, but they are happening less frequently now. They are independent of zofran use and resolved with aleve.   REVIEW OF SYSTEMS:   Constitutional: Denies fevers, chills or abnormal weight loss Eyes: Denies blurriness of vision Ears, nose, mouth, throat, and face: Denies mucositis or sore throat Respiratory: Improvement in the chronic cough, shortness of breath to even minimal exertion uses oxygen 24/7 Cardiovascular: Denies palpitation, chest discomfort or lower extremity swelling Gastrointestinal:  Denies nausea, heartburn or change in bowel habits Skin: Denies abnormal skin rashes Lymphatics: Denies new lymphadenopathy or easy bruising Neurological:Denies numbness, tingling or new weaknesses Behavioral/Psych: Mood is stable, no new changes   All other systems were reviewed with the patient and are negative.  I have reviewed the past medical history, past surgical history, social history and family history with the patient and they are unchanged from previous note.  ALLERGIES:  is allergic to sulfa antibiotics;  sulfonamide derivatives; and tomato.  MEDICATIONS:  Current Outpatient Prescriptions  Medication Sig Dispense Refill  . cetirizine (ZYRTEC) 10 MG tablet Take 10 mg by mouth daily.    . cholecalciferol  (VITAMIN D) 1000 UNITS tablet Take 1,000 Units by mouth every morning.     . diltiazem (CARDIZEM CD) 120 MG 24 hr capsule Take 1 capsule (120 mg total) by mouth every morning. 90 capsule 1  . esomeprazole (NEXIUM) 20 MG capsule Take 20 mg by mouth daily.     . fluticasone (FLONASE) 50 MCG/ACT nasal spray Place 2 sprays into both nostrils daily as needed for allergies or rhinitis.    Marland Kitchen latanoprost (XALATAN) 0.005 % ophthalmic solution Place 1 drop into both eyes at bedtime.     Marland Kitchen levothyroxine (SYNTHROID, LEVOTHROID) 50 MCG tablet Take 1 tablet (50 mcg total) by mouth daily. 90 tablet 3  . lidocaine-prilocaine (EMLA) cream Apply to affected area once (Patient taking differently: Apply 1 application topically as needed (for chemo). Apply to affected area once) 30 g 3  . LORazepam (ATIVAN) 0.5 MG tablet Take 1 tablet (0.5 mg total) by mouth every 6 (six) hours as needed (Nausea or vomiting). 30 tablet 0  . metFORMIN (GLUCOPHAGE) 500 MG tablet Take $RemoveBef'1000mg'oufUuMDFqn$  (2 tabs) in the morning and $RemoveBef'500mg'zJqSxIDivu$  (1 tab) in the evening with meals 270 tablet 3  . naproxen sodium (ANAPROX) 220 MG tablet Take 440 mg by mouth daily as needed (pain).    . NON FORMULARY Apply 1 application topically as needed (FUNGIFOAM - Applies to feet for athlete's foot.).     Marland Kitchen ondansetron (ZOFRAN) 8 MG tablet Take 1 tablet (8 mg total) by mouth 2 (two) times daily. Start the day after chemo for 2 days. Then take as needed for nausea or vomiting. 30 tablet 1  . Tetrahydrozoline HCl (VISINE OP) Apply 1-2 drops to eye daily as needed (itchy eyes.).    Marland Kitchen timolol (TIMOPTIC) 0.5 % ophthalmic solution Place 1 drop into both eyes 2 (two) times daily.    Marland Kitchen oxyCODONE (OXY IR/ROXICODONE) 5 MG immediate release tablet Take 1 tablet (5 mg total) by mouth every 4 (four) hours as needed for moderate pain. (Patient not taking: Reported on 03/21/2015) 30 tablet 0  . potassium chloride SA (KLOR-CON M20) 20 MEQ tablet TAKE 1 TABLET (20 MEQ TOTAL) BY MOUTH 2 (TWO)  TIMES DAILY. 60 tablet 0  . prochlorperazine (COMPAZINE) 10 MG tablet Take 1 tablet (10 mg total) by mouth every 6 (six) hours as needed (Nausea or vomiting). (Patient not taking: Reported on 03/21/2015) 30 tablet 1   No current facility-administered medications for this visit.    PHYSICAL EXAMINATION: ECOG PERFORMANCE STATUS: 1 - Symptomatic but completely ambulatory  Filed Vitals:   03/28/15 1305  BP: 147/73  Pulse: 101  Temp: 98.1 F (36.7 C)  Resp: 18   Filed Weights   03/28/15 1305  Weight: 186 lb 9.6 oz (84.641 kg)    GENERAL:alert, no distress and comfortable SKIN: skin color, texture, turgor are normal, no rashes or significant lesions EYES: normal, Conjunctiva are pink and non-injected, sclera clear OROPHARYNX:no exudate, no erythema and lips, buccal mucosa, and tongue normal  NECK: supple, thyroid normal size, non-tender, without nodularity LYMPH:  no palpable lymphadenopathy in the cervical, axillary or inguinal LUNGS: Diminished breath sounds at the right lung base with dullness to percussion HEART: regular rate & rhythm and no murmurs and no lower extremity edema ABDOMEN:abdomen soft, non-tender and normal bowel sounds Musculoskeletal:no cyanosis of  digits and no clubbing  NEURO: alert & oriented x 3 with fluent speech, no focal motor/sensory deficits   LABORATORY DATA:  I have reviewed the data as listed   Chemistry      Component Value Date/Time   NA 145 03/28/2015 1246   NA 136 02/13/2015 0433   K 3.3* 03/28/2015 1246   K 3.8 02/13/2015 0433   CL 99* 02/13/2015 0433   CL 101 01/25/2013 0952   CO2 27 03/28/2015 1246   CO2 30 02/13/2015 0433   BUN 11.3 03/28/2015 1246   BUN <5* 02/13/2015 0433   CREATININE 0.7 03/28/2015 1246   CREATININE 0.57 02/13/2015 0433      Component Value Date/Time   CALCIUM 9.6 03/28/2015 1246   CALCIUM 8.3* 02/13/2015 0433   ALKPHOS 81 03/28/2015 1246   ALKPHOS 78 02/12/2015 0531   AST 26 03/28/2015 1246   AST 17  02/12/2015 0531   ALT 21 03/28/2015 1246   ALT 11* 02/12/2015 0531   BILITOT 0.65 03/28/2015 1246   BILITOT 1.1 02/12/2015 0531       Lab Results  Component Value Date   WBC 3.5* 03/28/2015   HGB 11.2* 03/28/2015   HCT 34.6* 03/28/2015   MCV 95.8 03/28/2015   PLT 174 03/28/2015   NEUTROABS 1.8 03/28/2015    ASSESSMENT & PLAN:  Breast cancer of upper-outer quadrant of left female breast 09/30/2012 :Stage III, triple negative, invasive ductal carcinoma of the left breast. Neoadjuvant chemotherapy following the surgery followed by adjuvant radiation with concurrent Xeloda. 3.9 cm size and 9/13 lymph nodes were positive with extracapsular extension. ER/PR negative HER-2 negative Ki-67 21% March 2016: CT chest revealed bilateral lung nodules biopsy-proven to be triple negative metastatic carcinoma , pleural effusion status post thoracentesis malignant cells on cytology, repeat thoracentesis 12/01/2014 and 12/12/2014 PET CT scan 11/27/2014 showed widespread metastatic disease to the thorax with multiple pulmonary nodules and masses and malignant right pleural effusion and right hilar and mediastinal malignant lymph nodes. -------------------------------------------------------------------------------------------------------------------------------- Current Treatment: Cycle 5 day 8 Halaven (days 1,8 q 3 weeks) started 12/06/14 Shortness of breath: Related to malignant pleural effusion status post 3 thoracentesis. Pleurx catheter placed 01/02/2015 by Dr. Cyndia Bent.  Halaven toxicities: 1. Neutropenia: We started giving her Neulasta on day 8 of each cycle 2. Mild thrombocytopenia 3. Anemia grade 1 4. Reduced the dosage of Halaven from cycle 4 because of low WBC counts on day 8 5. Hospitalization 02/11/2015 for gastroenteritis probably related to food poisoning ([place]) 6. Headaches: resolved with aleve. unrelated to zofran use. Possibly migraines with aura due to vision changes and light  sensitivity. Increased eye pressure per ophthalmologist. Continue to monitor. Brain MRI on 9/2 7. Peripheral neuropathy: stable  The labs were reviewed in detail and were stable. She will proceed with cycle 6, day 8 of eribulin as planned today.  She will have a PET scan on 9/2 with brain MRI on same day to evaluate new onset headaches with vision changes and increased eye pressure. If negative, likely just migraines with visual aura. Patient will return in 2 weeks for review of scans with Dr. Lindi Adie.   No orders of the defined types were placed in this encounter.   The patient has a good understanding of the overall plan. she agrees with it. she will call with any problems that may develop before the next visit here.   Laurie Panda, NP

## 2015-03-28 NOTE — Patient Instructions (Signed)
[facility] Discharge Instructions for Patients Receiving Chemotherapy  Today you received the following chemotherapy agents: Halaven  To help prevent nausea and vomiting after your treatment, we encourage you to take your nausea medication as directed.    If you develop nausea and vomiting that is not controlled by your nausea medication, call the clinic.   BELOW ARE SYMPTOMS THAT SHOULD BE REPORTED IMMEDIATELY:  *FEVER GREATER THAN 100.5 F  *CHILLS WITH OR WITHOUT FEVER  NAUSEA AND VOMITING THAT IS NOT CONTROLLED WITH YOUR NAUSEA MEDICATION  *UNUSUAL SHORTNESS OF BREATH  *UNUSUAL BRUISING OR BLEEDING  TENDERNESS IN MOUTH AND THROAT WITH OR WITHOUT PRESENCE OF ULCERS  *URINARY PROBLEMS  *BOWEL PROBLEMS  UNUSUAL RASH Items with * indicate a potential emergency and should be followed up as soon as possible.  Feel free to call the clinic you have any questions or concerns. The clinic phone number is [phone].  Please show the CHEMO ALERT CARD at check-in to the Emergency Department and triage nurse.   

## 2015-04-03 ENCOUNTER — Emergency Department (HOSPITAL_COMMUNITY): Payer: BLUE CROSS/BLUE SHIELD

## 2015-04-03 ENCOUNTER — Encounter (HOSPITAL_COMMUNITY): Payer: Self-pay | Admitting: Emergency Medicine

## 2015-04-03 ENCOUNTER — Inpatient Hospital Stay (HOSPITAL_COMMUNITY)
Admission: EM | Admit: 2015-04-03 | Discharge: 2015-04-06 | DRG: 054 | Disposition: A | Discharge status: Home Health | Payer: BLUE CROSS/BLUE SHIELD | Attending: Internal Medicine | Admitting: Internal Medicine

## 2015-04-03 DIAGNOSIS — C7800 Secondary malignant neoplasm of unspecified lung: Secondary | ICD-10-CM | POA: Diagnosis present

## 2015-04-03 DIAGNOSIS — J9610 Chronic respiratory failure, unspecified whether with hypoxia or hypercapnia: Secondary | ICD-10-CM | POA: Diagnosis present

## 2015-04-03 DIAGNOSIS — C50912 Malignant neoplasm of unspecified site of left female breast: Secondary | ICD-10-CM

## 2015-04-03 DIAGNOSIS — Z171 Estrogen receptor negative status [ER-]: Secondary | ICD-10-CM

## 2015-04-03 DIAGNOSIS — C50412 Malignant neoplasm of upper-outer quadrant of left female breast: Secondary | ICD-10-CM | POA: Diagnosis not present

## 2015-04-03 DIAGNOSIS — Z79899 Other long term (current) drug therapy: Secondary | ICD-10-CM

## 2015-04-03 DIAGNOSIS — C7802 Secondary malignant neoplasm of left lung: Secondary | ICD-10-CM

## 2015-04-03 DIAGNOSIS — I10 Essential (primary) hypertension: Secondary | ICD-10-CM | POA: Diagnosis not present

## 2015-04-03 DIAGNOSIS — E669 Obesity, unspecified: Secondary | ICD-10-CM | POA: Diagnosis present

## 2015-04-03 DIAGNOSIS — Z9011 Acquired absence of right breast and nipple: Secondary | ICD-10-CM | POA: Diagnosis present

## 2015-04-03 DIAGNOSIS — Z66 Do not resuscitate: Secondary | ICD-10-CM | POA: Diagnosis present

## 2015-04-03 DIAGNOSIS — R4182 Altered mental status, unspecified: Secondary | ICD-10-CM | POA: Diagnosis not present

## 2015-04-03 DIAGNOSIS — R4701 Aphasia: Secondary | ICD-10-CM | POA: Diagnosis not present

## 2015-04-03 DIAGNOSIS — K219 Gastro-esophageal reflux disease without esophagitis: Secondary | ICD-10-CM | POA: Diagnosis present

## 2015-04-03 DIAGNOSIS — R51 Headache: Secondary | ICD-10-CM | POA: Diagnosis not present

## 2015-04-03 DIAGNOSIS — E119 Type 2 diabetes mellitus without complications: Secondary | ICD-10-CM | POA: Diagnosis present

## 2015-04-03 DIAGNOSIS — G936 Cerebral edema: Secondary | ICD-10-CM | POA: Diagnosis present

## 2015-04-03 DIAGNOSIS — Z833 Family history of diabetes mellitus: Secondary | ICD-10-CM

## 2015-04-03 DIAGNOSIS — C7931 Secondary malignant neoplasm of brain: Secondary | ICD-10-CM | POA: Diagnosis not present

## 2015-04-03 DIAGNOSIS — H532 Diplopia: Secondary | ICD-10-CM | POA: Diagnosis present

## 2015-04-03 DIAGNOSIS — E039 Hypothyroidism, unspecified: Secondary | ICD-10-CM | POA: Diagnosis present

## 2015-04-03 DIAGNOSIS — G9340 Encephalopathy, unspecified: Secondary | ICD-10-CM | POA: Diagnosis present

## 2015-04-03 DIAGNOSIS — Z9981 Dependence on supplemental oxygen: Secondary | ICD-10-CM

## 2015-04-03 DIAGNOSIS — Z8249 Family history of ischemic heart disease and other diseases of the circulatory system: Secondary | ICD-10-CM

## 2015-04-03 DIAGNOSIS — Z803 Family history of malignant neoplasm of breast: Secondary | ICD-10-CM

## 2015-04-03 DIAGNOSIS — Z6830 Body mass index (BMI) 30.0-30.9, adult: Secondary | ICD-10-CM

## 2015-04-03 DIAGNOSIS — Z800 Family history of malignant neoplasm of digestive organs: Secondary | ICD-10-CM

## 2015-04-03 LAB — CBC WITH DIFFERENTIAL/PLATELET
BASOS ABS: 0 10*3/uL (ref 0.0–0.1)
BASOS PCT: 0 % (ref 0–1)
EOS PCT: 0 % (ref 0–5)
Eosinophils Absolute: 0 10*3/uL (ref 0.0–0.7)
HEMATOCRIT: 34.6 % — AB (ref 36.0–46.0)
HEMOGLOBIN: 11.1 g/dL — AB (ref 12.0–15.0)
LYMPHS ABS: 1.9 10*3/uL (ref 0.7–4.0)
Lymphocytes Relative: 17 % (ref 12–46)
MCH: 31.3 pg (ref 26.0–34.0)
MCHC: 32.1 g/dL (ref 30.0–36.0)
MCV: 97.5 fL (ref 78.0–100.0)
MONO ABS: 1.4 10*3/uL — AB (ref 0.1–1.0)
Monocytes Relative: 13 % — ABNORMAL HIGH (ref 3–12)
NEUTROS ABS: 7.7 10*3/uL (ref 1.7–7.7)
NEUTROS PCT: 70 % (ref 43–77)
Platelets: 189 10*3/uL (ref 150–400)
RBC: 3.55 MIL/uL — AB (ref 3.87–5.11)
RDW: 18.4 % — AB (ref 11.5–15.5)
WBC: 11 10*3/uL — AB (ref 4.0–10.5)
nRBC: 1 /100 WBC — ABNORMAL HIGH

## 2015-04-03 LAB — URINALYSIS, ROUTINE W REFLEX MICROSCOPIC
BILIRUBIN URINE: NEGATIVE
GLUCOSE, UA: NEGATIVE mg/dL
HGB URINE DIPSTICK: NEGATIVE
Ketones, ur: NEGATIVE mg/dL
Leukocytes, UA: NEGATIVE
Nitrite: NEGATIVE
PH: 7 (ref 5.0–8.0)
Protein, ur: NEGATIVE mg/dL
SPECIFIC GRAVITY, URINE: 1.002 — AB (ref 1.005–1.030)
Urobilinogen, UA: 0.2 mg/dL (ref 0.0–1.0)

## 2015-04-03 LAB — CBG MONITORING, ED: Glucose-Capillary: 158 mg/dL — ABNORMAL HIGH (ref 65–99)

## 2015-04-03 LAB — BASIC METABOLIC PANEL
Anion gap: 10 (ref 5–15)
BUN: 7 mg/dL (ref 6–20)
CALCIUM: 9.6 mg/dL (ref 8.9–10.3)
CHLORIDE: 101 mmol/L (ref 101–111)
CO2: 32 mmol/L (ref 22–32)
CREATININE: 0.64 mg/dL (ref 0.44–1.00)
GFR calc non Af Amer: >60 mL/min (ref >60)
Glucose, Bld: 162 mg/dL — ABNORMAL HIGH (ref 65–99)
Potassium: 3.2 mmol/L — ABNORMAL LOW (ref 3.5–5.1)
SODIUM: 143 mmol/L (ref 135–145)

## 2015-04-03 LAB — GLUCOSE, CAPILLARY
Glucose-Capillary: 143 mg/dL — ABNORMAL HIGH (ref 65–99)
Glucose-Capillary: 185 mg/dL — ABNORMAL HIGH (ref 65–99)

## 2015-04-03 LAB — TSH: TSH: 2.094 u[IU]/mL (ref 0.350–4.500)

## 2015-04-03 MED ORDER — DILTIAZEM HCL ER COATED BEADS 120 MG PO CP24
120.0000 mg | ORAL_CAPSULE | Freq: Every morning | ORAL | Status: DC
  Administered 2015-04-03 – 2015-04-06 (×4): 120 mg via ORAL
  Filled 2015-04-03 (×4): qty 1

## 2015-04-03 MED ORDER — DEXAMETHASONE 6 MG PO TABS
10.0000 mg | ORAL_TABLET | Freq: Four times a day (QID) | ORAL | Status: DC
  Administered 2015-04-03: 10 mg via ORAL
  Filled 2015-04-03 (×4): qty 1

## 2015-04-03 MED ORDER — SODIUM CHLORIDE 0.9 % IV BOLUS (SEPSIS)
1000.0000 mL | Freq: Once | INTRAVENOUS | Status: AC
  Administered 2015-04-03: 1000 mL via INTRAVENOUS

## 2015-04-03 MED ORDER — INSULIN ASPART 100 UNIT/ML ~~LOC~~ SOLN
0.0000 [IU] | Freq: Three times a day (TID) | SUBCUTANEOUS | Status: DC
  Administered 2015-04-04 (×3): 5 [IU] via SUBCUTANEOUS
  Administered 2015-04-05: 3 [IU] via SUBCUTANEOUS
  Administered 2015-04-05: 5 [IU] via SUBCUTANEOUS
  Administered 2015-04-05: 8 [IU] via SUBCUTANEOUS
  Administered 2015-04-06: 11 [IU] via SUBCUTANEOUS
  Administered 2015-04-06: 5 [IU] via SUBCUTANEOUS

## 2015-04-03 MED ORDER — DEXAMETHASONE SODIUM PHOSPHATE 4 MG/ML IJ SOLN
4.0000 mg | Freq: Four times a day (QID) | INTRAMUSCULAR | Status: DC
  Administered 2015-04-03 – 2015-04-06 (×11): 4 mg via INTRAVENOUS
  Filled 2015-04-03 (×11): qty 1

## 2015-04-03 MED ORDER — POLYETHYLENE GLYCOL 3350 17 G PO PACK
17.0000 g | PACK | Freq: Every day | ORAL | Status: DC | PRN
Start: 2015-04-03 — End: 2015-04-06

## 2015-04-03 MED ORDER — LATANOPROST 0.005 % OP SOLN
1.0000 [drp] | Freq: Every day | OPHTHALMIC | Status: DC
  Administered 2015-04-03 – 2015-04-05 (×3): 1 [drp] via OPHTHALMIC
  Filled 2015-04-03 (×2): qty 2.5

## 2015-04-03 MED ORDER — OXYCODONE HCL 5 MG PO TABS
5.0000 mg | ORAL_TABLET | ORAL | Status: DC | PRN
  Administered 2015-04-03 – 2015-04-05 (×3): 5 mg via ORAL
  Filled 2015-04-03 (×3): qty 1

## 2015-04-03 MED ORDER — ONDANSETRON HCL 4 MG PO TABS: 4.0000 mg | ORAL_TABLET | Freq: Four times a day (QID) | ORAL | Status: DC | PRN

## 2015-04-03 MED ORDER — MORPHINE SULFATE (PF) 2 MG/ML IV SOLN
1.0000 mg | INTRAVENOUS | Status: DC | PRN
  Administered 2015-04-03 – 2015-04-04 (×2): 1 mg via INTRAVENOUS
  Filled 2015-04-03 (×2): qty 1

## 2015-04-03 MED ORDER — LEVOTHYROXINE SODIUM 50 MCG PO TABS
50.0000 ug | ORAL_TABLET | Freq: Every day | ORAL | Status: DC
  Administered 2015-04-03 – 2015-04-06 (×4): 50 ug via ORAL
  Filled 2015-04-03: qty 2
  Filled 2015-04-03: qty 1
  Filled 2015-04-03: qty 2
  Filled 2015-04-03 (×3): qty 1
  Filled 2015-04-03 (×2): qty 2
  Filled 2015-04-03: qty 1

## 2015-04-03 MED ORDER — TIMOLOL MALEATE 0.5 % OP SOLN
1.0000 [drp] | Freq: Two times a day (BID) | OPHTHALMIC | Status: DC
  Administered 2015-04-03 – 2015-04-06 (×7): 1 [drp] via OPHTHALMIC
  Filled 2015-04-03 (×2): qty 5

## 2015-04-03 MED ORDER — INSULIN ASPART 100 UNIT/ML ~~LOC~~ SOLN
0.0000 [IU] | Freq: Every day | SUBCUTANEOUS | Status: DC
  Administered 2015-04-04: 2 [IU] via SUBCUTANEOUS
  Administered 2015-04-05: 4 [IU] via SUBCUTANEOUS

## 2015-04-03 MED ORDER — GADOBENATE DIMEGLUMINE 529 MG/ML IV SOLN
20.0000 mL | Freq: Once | INTRAVENOUS | Status: AC | PRN
  Administered 2015-04-03: 17 mL via INTRAVENOUS

## 2015-04-03 MED ORDER — ONDANSETRON HCL 4 MG/2ML IJ SOLN
4.0000 mg | Freq: Four times a day (QID) | INTRAMUSCULAR | Status: DC | PRN
  Administered 2015-04-03: 4 mg via INTRAVENOUS
  Filled 2015-04-03: qty 2

## 2015-04-03 MED ORDER — LORAZEPAM 0.5 MG PO TABS: 0.5000 mg | ORAL_TABLET | Freq: Four times a day (QID) | ORAL | Status: DC | PRN

## 2015-04-03 NOTE — Progress Notes (Signed)
Patient admitted to room 1340, accompanied by family.  Alert and appropriate in general conversation and discussion, however continues to have disorientation to time; unable to state current year or date.  Remembered birthdate only after several minutes---aware of year of birth immediately, yet took several minutes to remember date, month.  Aware of current president and current presidential race details.  Aware of her surroundings and what brought her to ED.  Family very supportive.

## 2015-04-03 NOTE — ED Notes (Signed)
Pt arrived to the ED with a complaint of altered mental status.  Pt states that she became altered around 1800 yesterday.  Pt threw up around 2000 hrs and felt better.  Pt was confused at 1800 and it gradually got better but still is present this am  Pt is A7O x 3 but has confusion with directions and past memories.

## 2015-04-03 NOTE — Progress Notes (Signed)
HEMATOLOGY-ONCOLOGY PROGRESS NOTE  SUBJECTIVE: Patient is admitted with diffuse bilateral brain metastases. She presented with neurological symptoms of headaches, vision changes, mental status changes as well as nausea and vomiting. CT of the head revealed bilateral brain lesions. This was further evaluated by brain MRI that showed diffuse extensive bilateral brain metastases affecting both the cerebral hemispheres, cerebellum with vasogenic edema. There was no midline shift. No evidence of stroke or hemorrhage. She was started on oral Decadron and her symptoms have markedly improved.  OBJECTIVE: PHYSICAL EXAMINATION: ECOG PERFORMANCE STATUS: 3 - Symptomatic, >50% confined to bed  Filed Vitals:   04/03/15 1432  BP: 136/80  Pulse: 106  Temp: 98.5 F (36.9 C)  Resp: 20   Filed Weights   04/03/15 1024  Weight: 179 lb 3.7 oz (81.3 kg)    GENERAL:alert, no distress and comfortable SKIN: skin color, texture, turgor are normal, no rashes or significant lesions EYES: normal, Conjunctiva are pink and non-injected, sclera clear OROPHARYNX:no exudate, no erythema and lips, buccal mucosa, and tongue normal  NECK: supple, thyroid normal size, non-tender, without nodularity LYMPH:  no palpable lymphadenopathy in the cervical, axillary or inguinal LUNGS: clear to auscultation and percussion with normal breathing effort HEART: regular rate & rhythm and no murmurs and no lower extremity edema ABDOMEN:abdomen soft, non-tender and normal bowel sounds Musculoskeletal:no cyanosis of digits and no clubbing  NEURO: alert & oriented x 2 with fluent speech, no focal focal motor/sensory deficits  LABORATORY DATA:  I have reviewed the data as listed CMP Latest Ref Rng 04/03/2015 03/28/2015 03/21/2015  Glucose 65 - 99 mg/dL 162(H) 101 140  BUN 6 - 20 mg/dL 7 11.3 11.3  Creatinine 0.44 - 1.00 mg/dL 0.64 0.7 0.8  Sodium 135 - 145 mmol/L 143 145 143  Potassium 3.5 - 5.1 mmol/L 3.2(L) 3.3(L) 3.8  Chloride 101  - 111 mmol/L 101 - -  CO2 22 - 32 mmol/L 32 27 24  Calcium 8.9 - 10.3 mg/dL 9.6 9.6 8.5  Total Protein 6.4 - 8.3 g/dL - 6.8 6.7  Total Bilirubin 0.20 - 1.20 mg/dL - 0.65 0.54  Alkaline Phos 40 - 150 U/L - 81 94  AST 5 - 34 U/L - 26 19  ALT 0 - 55 U/L - 21 16    Lab Results  Component Value Date   WBC 11.0* 04/03/2015   HGB 11.1* 04/03/2015   HCT 34.6* 04/03/2015   MCV 97.5 04/03/2015   PLT 189 04/03/2015   NEUTROABS 7.7 04/03/2015    ASSESSMENT AND PLAN: 1. Diffuse bilateral brain metastases: I discussed with the patient that her symptoms of headaches, blurred vision, mental status changes are all primarily related to these brain metastases. She has had intermittent neurological symptoms for the past 2 weeks. Some of these were computed to migraines or Zofran. There was a plan to obtain a brain MRI as an outpatient. But her symptoms have gotten markedly worse so she had to be hospitalized.  Recommendation: 1. To brain radiation: I called and discussed the case with Dr. Isidore Moos who is willing to see her to evaluate for brain radiation therapy. 2. I changed her Decadron to IV so that her headaches can be improved. 3. We will postpone her pet CT scan that was originally scheduled for 04/06/2015 for after discharge to assess response to chemotherapy. Patient understands that metastatic breast cancer cannot be cured and the goal of treatment is palliation.  I agree with palliative care consultation. I appreciate the excellent care being provided by  our hospitalist team. Thank you very much for allowing [place] to participate in her care.

## 2015-04-03 NOTE — Consult Note (Signed)
Reason for Consult:Brain metastasis Referring Physician: [place] Pink  CC: Brain metastasis  HPI: Christine [name] is an 59 y.o. female with a history of breast cancer who has had complaints over the past 2-3 weeks of headaches.  Was scheduled for an MRI by her outpatient physician for later this week.  On yesterday evening complained of "not feeling right" and having difficulty getting her words out.  Became nauseous last night as well and had a few  Episodes of vomiting.  This morning on awakening felt some better but was still having some word finding difficulty.  Patient presented for evaluation.     Past Medical History  Diagnosis Date  . ALLERGIC RHINITIS   . GERD (gastroesophageal reflux disease)   . Hypertension   . Hypothyroidism   . Hx of colonic polyps   . Wears glasses   . Radiation 09/15/13-11/01/13    Left chest wall/PAB/scar/supraclavicular fossa  . SYNCOPE 05/15/2010    Qualifier: Diagnosis of  By: Arnoldo Morale MD, [place], WITH RADICULOPATHY 10/01/2007    Qualifier: Diagnosis of  By: Arnoldo Morale MD, [place] 05/31/2007    Qualifier: Diagnosis of  By: Arnoldo Morale MD, [place], HX OF 05/31/2007    Qualifier: Diagnosis of  By: Arnoldo Morale MD, Balinda Quails   . Lymph edema L arm from breast ca tx 12/22/2013  . H/O blood clots     hx of small blood clots in both legs  . Diabetes mellitus without complication     type 2 - on Metformin  . Anxiety   . Neuropathy due to chemotherapeutic drug   . Breast cancer 56, age 65    right  . Breast cancer 09/30/12    left, ER/PR -, Her 2 -  . Secondary lung cancer 2016  . Anemia     during chemo  . History of IBS   . Glaucoma   . Dry skin   . Lymphedema of arm     left arm, post mastectomy  . Shortness of breath dyspnea     with exertion (since onset of pleural effusion) Wears O2 2 L as needed  . Chronic respiratory failure; On Home oxygen 2L since 12/03/2014 02/11/2015    Past Surgical History  Procedure  Laterality Date  . Tonsillectomy    . Caesarean section      x 1  . Mastectomy  04/1987    right side  . Abdominal hysterectomy  06/1988    fibroids  . Portacath placement Right 10/28/2012    Procedure: PORT PLACEMENT;  Surgeon: Joyice Faster. Cornett, MD;  Location: [facility];  Service: General;  Laterality: Right;  Right Subclavian Vein  . Mastectomy modified radical Left 07/19/2013    Procedure: MASTECTOMY MODIFIED RADICAL;  Surgeon: Marcello Moores A. Cornett, MD;  Location: [facility];  Service: General;  Laterality: Left;  . Port-a-cath removal Right 07/19/2013    Procedure: REMOVAL PORT-A-CATH;  Surgeon: Joyice Faster. Cornett, MD;  Location: [facility];  Service: General;  Laterality: Right;  . Cesarean section    . Breast surgery Left 07/19/13    MRM  . Breast surgery Right     mastectomy  . Port-a-cath insertion  12/03/2014  . Colonoscopy    . Chest tube insertion Right 01/02/2015    Procedure: INSERTION PLEURAL DRAINAGE CATHETER RIGHT CHEST;  Surgeon: Gaye Pollack, MD;  Location: [facility];  Service: Thoracic;  Laterality: Right;  . Removal of pleural drainage catheter Right 01/16/2015    Procedure: REMOVAL OF PLEURAL DRAINAGE CATHETER;  Surgeon: Gaye Pollack, MD;  Location: [facility];  Service: Thoracic;  Laterality: Right;  . Talc pleurodesis Right 01/16/2015    Procedure: Pietro Cassis;  Surgeon: Gaye Pollack, MD;  Location: [facility];  Service: Thoracic;  Laterality: Right;    Family History  Problem Relation Age of Onset  . Colon cancer Mother 34    family hx of colon ca 1st degree relative <60  . Hypertension Mother   . Coronary artery disease Father     family hx of CAD female 1st degree relative <50  . Stomach cancer Neg Hx   . Rectal cancer Neg Hx   . Esophageal cancer Neg Hx   . Breast cancer Maternal Grandmother 80  . Diabetes Paternal Grandmother   . Breast cancer Cousin 69    maternal cousin  . Coronary artery disease Maternal  Grandfather   . Breast cancer Sister     paternal half sister; died in her 46s    Social History:  reports that she has never smoked. She has never used smokeless tobacco. She reports that she does not drink alcohol or use illicit drugs.  Allergies  Allergen Reactions  . Sulfa Antibiotics Hives, Itching and Swelling  . Sulfonamide Derivatives Hives, Itching and Swelling  . Tomato Hives and Itching    Medications:  I have reviewed the patient's current medications. Prior to Admission:  Prescriptions prior to admission  Medication Sig Dispense Refill Last Dose  . cetirizine (ZYRTEC) 10 MG tablet Take 10 mg by mouth daily as needed for allergies.    Past Week at Unknown time  . cholecalciferol (VITAMIN D) 1000 UNITS tablet Take 1,000 Units by mouth every morning.    Past Month at Unknown time  . diltiazem (CARDIZEM CD) 120 MG 24 hr capsule Take 1 capsule (120 mg total) by mouth every morning. 90 capsule 1 Past Week at Unknown time  . esomeprazole (NEXIUM) 20 MG capsule Take 20 mg by mouth daily as needed (acid).    Past Month at Unknown time  . fluticasone (FLONASE) 50 MCG/ACT nasal spray Place 2 sprays into both nostrils daily as needed for allergies or rhinitis.   Past Week at Unknown time  . ibuprofen (ADVIL,MOTRIN) 200 MG tablet Take 200 mg by mouth every 4 (four) hours as needed for headache.   Past Week at Unknown time  . latanoprost (XALATAN) 0.005 % ophthalmic solution Place 1 drop into both eyes at bedtime.    Past Week at Unknown time  . levothyroxine (SYNTHROID, LEVOTHROID) 50 MCG tablet Take 1 tablet (50 mcg total) by mouth daily. 90 tablet 3 04/02/2015 at Unknown time  . lidocaine-prilocaine (EMLA) cream Apply to affected area once (Patient taking differently: Apply 1 application topically as needed (for chemo). Apply to affected area once) 30 g 3 03/28/2015  . LORazepam (ATIVAN) 0.5 MG tablet Take 1 tablet (0.5 mg total) by mouth every 6 (six) hours as needed (Nausea or  vomiting). 30 tablet 0 Past Week at Unknown time  . metFORMIN (GLUCOPHAGE) 500 MG tablet Take 1000mg  (2 tabs) in the morning and 500mg  (1 tab) in the evening with meals 270 tablet 3 Past Week at Unknown time  . naproxen sodium (ANAPROX) 220 MG tablet Take 220 mg by mouth daily as needed (pain).    Past Month at Unknown time  . NON FORMULARY Apply 1 application  topically as needed (FUNGIFOAM - Applies to feet for athlete's foot.).    Past Month at Unknown time  . ondansetron (ZOFRAN) 8 MG tablet Take 1 tablet (8 mg total) by mouth 2 (two) times daily. Start the day after chemo for 2 days. Then take as needed for nausea or vomiting. 30 tablet 1 Past Month at Unknown time  . oxyCODONE (OXY IR/ROXICODONE) 5 MG immediate release tablet Take 1 tablet (5 mg total) by mouth every 4 (four) hours as needed for moderate pain. 30 tablet 0 unknown  . potassium chloride SA (KLOR-CON M20) 20 MEQ tablet TAKE 1 TABLET (20 MEQ TOTAL) BY MOUTH 2 (TWO) TIMES DAILY. 60 tablet 0 04/02/2015 at Unknown time  . prochlorperazine (COMPAZINE) 10 MG tablet Take 1 tablet (10 mg total) by mouth every 6 (six) hours as needed (Nausea or vomiting). 30 tablet 1 Past Month at Unknown time  . Tetrahydrozoline HCl (VISINE OP) Apply 1-2 drops to eye daily as needed (itchy eyes.).   Past Month at Unknown time  . timolol (TIMOPTIC) 0.5 % ophthalmic solution Place 1 drop into both eyes 2 (two) times daily.   Past Week at Unknown time   Scheduled: . dexamethasone  10 mg Oral 4 times per day  . diltiazem  120 mg Oral q morning - 10a  . latanoprost  1 drop Both Eyes QHS  . levothyroxine  50 mcg Oral QAC breakfast  . timolol  1 drop Both Eyes BID    ROS: History obtained from the patient  General ROS: negative for - chills, fatigue, fever, night sweats, weight gain or weight loss Psychological ROS: confusion Ophthalmic ROS: blurry vision ENT ROS: negative for - epistaxis, nasal discharge, oral lesions, sore throat, tinnitus or  vertigo Allergy and Immunology ROS: negative for - hives or itchy/watery eyes Hematological and Lymphatic ROS: negative for - bleeding problems, bruising or swollen lymph nodes Endocrine ROS: negative for - galactorrhea, hair pattern changes, polydipsia/polyuria or temperature intolerance Respiratory ROS: negative for - cough, hemoptysis, shortness of breath or wheezing Cardiovascular ROS: negative for - chest pain, dyspnea on exertion, edema or irregular heartbeat Gastrointestinal ROS: nausea/vomiting  Genito-Urinary ROS: negative for - dysuria, hematuria, incontinence or urinary frequency/urgency Musculoskeletal ROS: negative for - joint swelling or muscular weakness Neurological ROS: as noted in HPI Dermatological ROS: negative for rash and skin lesion changes  Physical Examination: Blood pressure 146/77, pulse 95, temperature 97.9 F (36.6 C), temperature source Oral, resp. rate 20, height 5\' 4"  (1.626 m), weight 81.3 kg (179 lb 3.7 oz), SpO2 100 %.  HEENT-  Normocephalic, no lesions, without obvious abnormality.  Normal external eye and conjunctiva.  Normal TM's bilaterally.  Normal auditory canals and external ears. Normal external nose, mucus membranes and septum.  Normal pharynx. Cardiovascular- S1, S2 normal, pulses palpable throughout   Lungs- chest clear, no wheezing, rales, normal symmetric air entry Abdomen- soft, non-tender; bowel sounds normal; no masses,  no organomegaly Extremities- no edema Lymph-no adenopathy palpable Musculoskeletal-no joint tenderness, deformity or swelling Skin-warm and dry, no hyperpigmentation, vitiligo, or suspicious lesions  Neurological Examination Mental Status: Alert, oriented, thought content appropriate.  Speech fluent without evidence of aphasia.  Unable to do calculations.  During examination unable to remember where her nose was.  Able to follow 3 step commands but requires significant reinforcement. Cranial Nerves: II: Discs flat  bilaterally; Visual fields grossly normal, pupils equal, round, reactive to light and accommodation III,IV, VI: ptosis not present, extra-ocular motions intact bilaterally with some diplopia  on right lateral gaze V,VII: smile symmetric, facial light touch sensation normal bilaterally VIII: hearing normal bilaterally IX,X: gag reflex present XI: bilateral shoulder shrug XII: midline tongue extension Motor: Right : Upper extremity   5/5    Left:     Upper extremity   4+/5  Lower extremity   5/5     Lower extremity   5/5 Tone and bulk:normal tone throughout; no atrophy noted Sensory: Pinprick and light touch intact throughout, bilaterally Deep Tendon Reflexes: Trace and symmetric throughout Plantars: Right: downgoing   Left: downgoing Cerebellar: Normal finger-to-nose testing bilaterally.  Heel-to-shin testing with some dysmetria using the RLE Gait: not tested due to safety concerns       Laboratory Studies:   Basic Metabolic Panel:  Recent Labs Lab 03/28/15 1246 04/03/15 0810  NA 145 143  K 3.3* 3.2*  CL  --  101  CO2 27 32  GLUCOSE 101 162*  BUN 11.3 7  CREATININE 0.7 0.64  CALCIUM 9.6 9.6    Liver Function Tests:  Recent Labs Lab 03/28/15 1246  AST 26  ALT 21  ALKPHOS 81  BILITOT 0.65  PROT 6.8  ALBUMIN 4.0   No results for input(s): LIPASE, AMYLASE in the last 168 hours. No results for input(s): AMMONIA in the last 168 hours.  CBC:  Recent Labs Lab 03/28/15 1246 04/03/15 0810  WBC 3.5* 11.0*  NEUTROABS 1.8 7.7  HGB 11.2* 11.1*  HCT 34.6* 34.6*  MCV 95.8 97.5  PLT 174 189    Cardiac Enzymes: No results for input(s): CKTOTAL, CKMB, CKMBINDEX, TROPONINI in the last 168 hours.  BNP: Invalid input(s): POCBNP  CBG:  Recent Labs Lab 04/03/15 0807  GLUCAP 158*    Microbiology: Results for orders placed or performed during the hospital encounter of 12/29/14  Surgical pcr screen     Status: None   Collection Time: 12/29/14 12:04 PM  Result  Value Ref Range Status   MRSA, PCR NEGATIVE NEGATIVE Final   Staphylococcus aureus NEGATIVE NEGATIVE Final    Comment:        The Xpert SA Assay (FDA approved for NASAL specimens in patients over 84 years of age), is one component of a comprehensive surveillance program.  Test performance has been validated by [facility] for patients greater than or equal to 47 year old. It is not intended to diagnose infection nor to guide or monitor treatment.     Coagulation Studies: No results for input(s): LABPROT, INR in the last 72 hours.  Urinalysis:  Recent Labs Lab 04/03/15 0830  COLORURINE COLORLESS*  LABSPEC 1.002*  PHURINE 7.0  GLUCOSEU NEGATIVE  HGBUR NEGATIVE  BILIRUBINUR NEGATIVE  KETONESUR NEGATIVE  PROTEINUR NEGATIVE  UROBILINOGEN 0.2  NITRITE NEGATIVE  LEUKOCYTESUR NEGATIVE    Lipid Panel:     Component Value Date/Time   CHOL 195 12/14/2013 1036   TRIG 145.0 12/14/2013 1036   HDL 40.00 12/14/2013 1036   CHOLHDL 5 12/14/2013 1036   VLDL 29.0 12/14/2013 1036   LDLCALC 126* 12/14/2013 1036    HgbA1C:  Lab Results  Component Value Date   HGBA1C 6.3* 02/11/2015    Urine Drug Screen:  No results found for: LABOPIA, COCAINSCRNUR, LABBENZ, AMPHETMU, THCU, LABBARB  Alcohol Level: No results for input(s): ETH in the last 168 hours.  Other results: EKG: sinus rhythm at 96 bpm.  Imaging: Ct Head Wo Contrast  04/03/2015   CLINICAL DATA:  59 year old female with history of breast and lung cancer post chemotherapy and radiation  therapy. Nausea with altered mental status and confusion since 6 p.m. 04/02/2015. History of high blood pressure. Initial encounter.  EXAM: CT HEAD WITHOUT CONTRAST  TECHNIQUE: Contiguous axial images were obtained from the base of the skull through the vertex without intravenous contrast.  COMPARISON:  05/03/2010 head CT.  FINDINGS: No intracranial hemorrhage.  New from prior CT are hypodensities within the right frontal and left parietal  lobe. Although it is possible these findings represent result of small vessel disease type changes, given the patient's history of metastatic disease, contrast-enhanced imaging (preferably MR) may be considered to determine if this reflects result of intracranial metastatic disease.  No CT evidence of large acute infarct.  No hydrocephalus.  No discrete calvarial findings to suggest osseous metastatic disease with calvarium having a similar appearance to prior CT.  Orbital structures unremarkable.  IMPRESSION: New from prior CT are hypodensities within the right frontal and left parietal lobe. Although it is possible these findings represent result of small vessel disease type changes, given the patient's history of metastatic disease, contrast-enhanced imaging (preferably MR) may be considered to determine if this reflects result of intracranial metastatic disease.   Electronically Signed   By: Genia Del M.D.   On: 04/03/2015 07:42   Mr [name] IA Contrast  04/03/2015   CLINICAL DATA:  Altered mental status. Symptoms began yesterday. History of breast cancer.  EXAM: MRI HEAD WITHOUT AND WITH CONTRAST  TECHNIQUE: Multiplanar, multiecho pulse sequences of the brain and surrounding structures were obtained without and with intravenous contrast.  CONTRAST:  55mL MULTIHANCE GADOBENATE DIMEGLUMINE 529 MG/ML IV SOLN  COMPARISON:  CT head earlier today.  FINDINGS: No acute stroke, acute hemorrhage, hydrocephalus, or extra-axial fluid. Normal for age cerebral volume. No evidence for small vessel disease.  Areas of vasogenic edema are seen throughout the supratentorial white matter, also affecting the BILATERAL cerebellum, greatest in the LEFT posterior parietal lobe. No midline abnormalities. No chronic hemorrhage. Flow voids are preserved.  Post infusion, there are multiple enhancing lesions throughout the brain representing metastatic breast cancer. These are estimated to be between 25 and 50 in number. Index  lesion in the RIGHT cerebellum measures 22 x 11 x 12 mm (R-L x A-P x C-C). Index lesion in the supratentorial compartment, LEFT inferior and posterior parietal gray-white junction, measures 12 x 17 x 8 mm. No midline shift.  Extracranial soft tissues grossly unremarkable. No definite osseous lesions.  IMPRESSION: Widespread intracranial metastatic disease accounts for the CT findings. This affects both the cerebral hemispheres as well as the cerebellum. Oncologic consultation is warranted. See discussion above.   Electronically Signed   By: Staci Righter M.D.   On: 04/03/2015 09:29     Assessment/Plan: 59 year old female with metastatic brain lesions.  MRI of the brain personally reviewed and shows multiple metastatic lesions, both supratentorial and infratentorial.  No evidence of midline shift or early herniation.  Neurological examination significant for confusion and some LUE weakness, diplopia on right lateral gaze and mild dysmetria.  Patient not a surgical candidate due to the multiplicity of lesions.    Recommendations: 1.  Decadron 2.  Radiation Oncology involvement 3.  Oncology involvement  Case discussed with Dr. Latrelle Dodrill, MD [facility] [phone] 04/03/2015, 2:09 PM

## 2015-04-03 NOTE — H&P (Signed)
History and Physical  Christine [name]:[id number] DOB: Feb 08, 1956 DOA: 04/03/2015  Referring physician: Idelia Salm, ER physician PCP: Lucretia Kern., DO   Chief Complaint: Headache and difficulty talking  HPI: Christine [name] is a 59 y.o. female  With past medical history of hypertension, hypothyroidism and recurrent breast carcinoma stage IV being treated with chemotherapy initially thought to have spread to lungs only presented to the emergency room today with one-day of difficulty word finding. Patient states she's been in good spirits and overall good health with not too many complications from her breast cancer treatment when she started having headaches intermittently several weeks ago. Symptoms have persisted and then she started having episodes of nausea and vomiting yesterday and difficulty talking. She also feels like her memory has been impaired as of late. She came into the emergency room and then CT scan further confirmed by MRI notes diffuse metastatic disease including the cerebellum.  Neurology and hospitalists called for further evaluation.   Review of Systems:  Patient seen after arrival to floor  Patient t complains of mild headache.  Pt denies any vision changes, dysphagia, chest pain, palpitations, shortness of breath, wheeze, cough, abdominal pain, hematuria, dysuria, constipation, diarrhea, focal extremity numbness weakness or pain.  Review of systems are otherwise negative  Past Medical History  Diagnosis Date  . ALLERGIC RHINITIS   . GERD (gastroesophageal reflux disease)   . Hypertension   . Hypothyroidism   . Hx of colonic polyps   . Wears glasses   . Radiation 09/15/13-11/01/13    Left chest wall/PAB/scar/supraclavicular fossa  . SYNCOPE 05/15/2010    Qualifier: Diagnosis of  By: Arnoldo Morale MD, [place], WITH RADICULOPATHY 10/01/2007    Qualifier: Diagnosis of  By: Arnoldo Morale MD, [place] 05/31/2007    Qualifier: Diagnosis of  By:  Arnoldo Morale MD, [place], HX OF 05/31/2007    Qualifier: Diagnosis of  By: Arnoldo Morale MD, [place]   . Lymph edema L arm from breast ca tx 12/22/2013  . H/O blood clots     hx of small blood clots in both legs  . Diabetes mellitus without complication     type 2 - on Metformin  . Anxiety   . Neuropathy due to chemotherapeutic drug   . Breast cancer 19, age 76    right  . Breast cancer 09/30/12    left, ER/PR -, Her 2 -  . Secondary lung cancer 2016  . Anemia     during chemo  . History of IBS   . Glaucoma   . Dry skin   . Lymphedema of arm     left arm, post mastectomy  . Shortness of breath dyspnea     with exertion (since onset of pleural effusion) Wears O2 2 L as needed  . Chronic respiratory failure; On Home oxygen 2L since 12/03/2014 02/11/2015   Past Surgical History  Procedure Laterality Date  . Tonsillectomy    . Caesarean section      x 1  . Mastectomy  04/1987    right side  . Abdominal hysterectomy  06/1988    fibroids  . Portacath placement Right 10/28/2012    Procedure: PORT PLACEMENT;  Surgeon: Joyice Faster. Cornett, MD;  Location: [facility];  Service: General;  Laterality: Right;  Right Subclavian Vein  . Mastectomy modified radical Left 07/19/2013    Procedure: MASTECTOMY MODIFIED RADICAL;  Surgeon: Marcello Moores  Nydia Bouton, MD;  Location: [facility];  Service: General;  Laterality: Left;  . Port-a-cath removal Right 07/19/2013    Procedure: REMOVAL PORT-A-CATH;  Surgeon: Joyice Faster. Cornett, MD;  Location: [facility];  Service: General;  Laterality: Right;  . Cesarean section    . Breast surgery Left 07/19/13    MRM  . Breast surgery Right     mastectomy  . Port-a-cath insertion  12/03/2014  . Colonoscopy    . Chest tube insertion Right 01/02/2015    Procedure: INSERTION PLEURAL DRAINAGE CATHETER RIGHT CHEST;  Surgeon: Gaye Pollack, MD;  Location: [facility] OR;  Service: Thoracic;  Laterality: Right;  . Removal of pleural  drainage catheter Right 01/16/2015    Procedure: REMOVAL OF PLEURAL DRAINAGE CATHETER;  Surgeon: Gaye Pollack, MD;  Location: [facility];  Service: Thoracic;  Laterality: Right;  . [place] pleurodesis Right 01/16/2015    Procedure: Pietro Cassis;  Surgeon: Gaye Pollack, MD;  Location: [facility] OR;  Service: Thoracic;  Laterality: Right;   Social History:  reports that she has never smoked. She has never used smokeless tobacco. She reports that she does not drink alcohol or use illicit drugs. Patient lives at home with her husband & is able to participate in activities of daily living with out assistance  Allergies  Allergen Reactions  . Sulfa Antibiotics Hives, Itching and Swelling  . Sulfonamide Derivatives Hives, Itching and Swelling  . Tomato Hives and Itching    Family History  Problem Relation Age of Onset  . Colon cancer Mother 59    family hx of colon ca 1st degree relative <60  . Hypertension Mother   . Coronary artery disease Father     family hx of CAD female 1st degree relative <50  . Stomach cancer Neg Hx   . Rectal cancer Neg Hx   . Esophageal cancer Neg Hx   . Breast cancer Maternal Grandmother 80  . Diabetes Paternal Grandmother   . Breast cancer Cousin 63    maternal cousin  . Coronary artery disease Maternal Grandfather   . Breast cancer Sister     paternal half sister; died in her 4s      Prior to Admission medications   Medication Sig Start Date End Date Taking? Authorizing Provider  cetirizine (ZYRTEC) 10 MG tablet Take 10 mg by mouth daily as needed for allergies.    Yes Historical Provider, MD  cholecalciferol (VITAMIN D) 1000 UNITS tablet Take 1,000 Units by mouth every morning.    Yes Historical Provider, MD  diltiazem (CARDIZEM CD) 120 MG 24 hr capsule Take 1 capsule (120 mg total) by mouth every morning. 01/04/15  Yes Lucretia Kern, DO  esomeprazole (NEXIUM) 20 MG capsule Take 20 mg by mouth daily as needed (acid).    Yes Historical Provider, MD  fluticasone  (FLONASE) 50 MCG/ACT nasal spray Place 2 sprays into both nostrils daily as needed for allergies or rhinitis.   Yes Historical Provider, MD  ibuprofen (ADVIL,MOTRIN) 200 MG tablet Take 200 mg by mouth every 4 (four) hours as needed for headache.   Yes Historical Provider, MD  latanoprost (XALATAN) 0.005 % ophthalmic solution Place 1 drop into both eyes at bedtime.    Yes Historical Provider, MD  levothyroxine (SYNTHROID, LEVOTHROID) 50 MCG tablet Take 1 tablet (50 mcg total) by mouth daily. 11/07/14  Yes Lucretia Kern, DO  lidocaine-prilocaine (EMLA) cream Apply to affected area once Patient taking differently: Apply 1 application  topically as needed (for chemo). Apply to affected area once 11/16/14  Yes Nicholas Lose, MD  LORazepam (ATIVAN) 0.5 MG tablet Take 1 tablet (0.5 mg total) by mouth every 6 (six) hours as needed (Nausea or vomiting). 02/28/15  Yes Nicholas Lose, MD  metFORMIN (GLUCOPHAGE) 500 MG tablet Take 1000mg  (2 tabs) in the morning and 500mg  (1 tab) in the evening with meals 02/23/15  Yes Lucretia Kern, DO  naproxen sodium (ANAPROX) 220 MG tablet Take 220 mg by mouth daily as needed (pain).    Yes Historical Provider, MD  NON FORMULARY Apply 1 application topically as needed (FUNGIFOAM - Applies to feet for athlete's foot.).    Yes Historical Provider, MD  ondansetron (ZOFRAN) 8 MG tablet Take 1 tablet (8 mg total) by mouth 2 (two) times daily. Start the day after chemo for 2 days. Then take as needed for nausea or vomiting. 11/16/14  Yes Nicholas Lose, MD  oxyCODONE (OXY IR/ROXICODONE) 5 MG immediate release tablet Take 1 tablet (5 mg total) by mouth every 4 (four) hours as needed for moderate pain. 01/02/15  Yes Gaye Pollack, MD  potassium chloride SA (KLOR-CON M20) 20 MEQ tablet TAKE 1 TABLET (20 MEQ TOTAL) BY MOUTH 2 (TWO) TIMES DAILY. 02/06/15  Yes Nicholas Lose, MD  prochlorperazine (COMPAZINE) 10 MG tablet Take 1 tablet (10 mg total) by mouth every 6 (six) hours as needed (Nausea or vomiting).  11/16/14  Yes Nicholas Lose, MD  Tetrahydrozoline HCl (VISINE OP) Apply 1-2 drops to eye daily as needed (itchy eyes.).   Yes Historical Provider, MD  timolol (TIMOPTIC) 0.5 % ophthalmic solution Place 1 drop into both eyes 2 (two) times daily.   Yes Historical Provider, MD    Physical Exam: BP 136/80 mmHg  Pulse 106  Temp(Src) 98.5 F (36.9 C) (Oral)  Resp 20  Ht 5\' 4"  (1.626 m)  Wt 81.3 kg (179 lb 3.7 oz)  BMI 30.75 kg/m2  SpO2 100%  Gen.: Alert and oriented 3, moderate distress secondary to diagnosis. HEENT: Normocephalic, atraumatic, mucous membranes are slightly dry. Extraocular movements appear intact. Sclera nonicteric. Cardiovascular: Regular rate and rhythm, S1 and S2 Neck: No JVD Lungs: Clear to auscultation bilaterally  Abdomen: Soft, nontender, mild distention, hypoactive bowel sounds Extremities: No clubbing or cyanosis or edema Musculoskeletal: Flexion/extension/grip is symmetric although there is a generalized weakness 5-/5  Psychiatry: Patient is appropriate, no evidence of psychoses  Skin: No skin breaks, tears or lesions  Neuro: Downgoing toes, cranial nerves II through XII are intact, patient did have some difficulty on the right side with finger to nose more in terms of following orders rather than coordination. Sensation appears intact           Labs on Admission:  Basic Metabolic Panel:  Recent Labs Lab 03/28/15 1246 04/03/15 0810  NA 145 143  K 3.3* 3.2*  CL  --  101  CO2 27 32  GLUCOSE 101 162*  BUN 11.3 7  CREATININE 0.7 0.64  CALCIUM 9.6 9.6   Liver Function Tests:  Recent Labs Lab 03/28/15 1246  AST 26  ALT 21  ALKPHOS 81  BILITOT 0.65  PROT 6.8  ALBUMIN 4.0   No results for input(s): LIPASE, AMYLASE in the last 168 hours. No results for input(s): AMMONIA in the last 168 hours. CBC:  Recent Labs Lab 03/28/15 1246 04/03/15 0810  WBC 3.5* 11.0*  NEUTROABS 1.8 7.7  HGB 11.2* 11.1*  HCT 34.6* 34.6*  MCV 95.8 97.5  PLT  174  189   Cardiac Enzymes: No results for input(s): CKTOTAL, CKMB, CKMBINDEX, TROPONINI in the last 168 hours.  BNP (last 3 results)  Recent Labs  10/30/14 1300  BNP 55.3    ProBNP (last 3 results) No results for input(s): PROBNP in the last 8760 hours.  CBG:  Recent Labs Lab 04/03/15 0807  GLUCAP 158*    Radiological Exams on Admission: Ct Head Wo Contrast  04/03/2015   CLINICAL DATA:  59 year old female with history of breast and lung cancer post chemotherapy and radiation therapy. Nausea with altered mental status and confusion since 6 p.m. 04/02/2015. History of high blood pressure. Initial encounter.  EXAM: CT HEAD WITHOUT CONTRAST  TECHNIQUE: Contiguous axial images were obtained from the base of the skull through the vertex without intravenous contrast.  COMPARISON:  05/03/2010 head CT.  FINDINGS: No intracranial hemorrhage.  New from prior CT are hypodensities within the right frontal and left parietal lobe. Although it is possible these findings represent result of small vessel disease type changes, given the patient's history of metastatic disease, contrast-enhanced imaging (preferably MR) may be considered to determine if this reflects result of intracranial metastatic disease.  No CT evidence of large acute infarct.  No hydrocephalus.  No discrete calvarial findings to suggest osseous metastatic disease with calvarium having a similar appearance to prior CT.  Orbital structures unremarkable.  IMPRESSION: New from prior CT are hypodensities within the right frontal and left parietal lobe. Although it is possible these findings represent result of small vessel disease type changes, given the patient's history of metastatic disease, contrast-enhanced imaging (preferably MR) may be considered to determine if this reflects result of intracranial metastatic disease.   Electronically Signed   By: Genia Del M.D.   On: 04/03/2015 07:42   Mr [name] GY Contrast  04/03/2015   CLINICAL  DATA:  Altered mental status. Symptoms began yesterday. History of breast cancer.  EXAM: MRI HEAD WITHOUT AND WITH CONTRAST  TECHNIQUE: Multiplanar, multiecho pulse sequences of the brain and surrounding structures were obtained without and with intravenous contrast.  CONTRAST:  49mL MULTIHANCE GADOBENATE DIMEGLUMINE 529 MG/ML IV SOLN  COMPARISON:  CT head earlier today.  FINDINGS: No acute stroke, acute hemorrhage, hydrocephalus, or extra-axial fluid. Normal for age cerebral volume. No evidence for small vessel disease.  Areas of vasogenic edema are seen throughout the supratentorial white matter, also affecting the BILATERAL cerebellum, greatest in the LEFT posterior parietal lobe. No midline abnormalities. No chronic hemorrhage. Flow voids are preserved.  Post infusion, there are multiple enhancing lesions throughout the brain representing metastatic breast cancer. These are estimated to be between 25 and 50 in number. Index lesion in the RIGHT cerebellum measures 22 x 11 x 12 mm (R-L x A-P x C-C). Index lesion in the supratentorial compartment, LEFT inferior and posterior parietal gray-white junction, measures 12 x 17 x 8 mm. No midline shift.  Extracranial soft tissues grossly unremarkable. No definite osseous lesions.  IMPRESSION: Widespread intracranial metastatic disease accounts for the CT findings. This affects both the cerebral hemispheres as well as the cerebellum. Oncologic consultation is warranted. See discussion above.   Electronically Signed   By: Staci Righter M.D.   On: 04/03/2015 09:29    EKG: Independently reviewed. Sinus rhythm with borderline short PR interval or 114  Assessment/Plan Present on Admission:  . Aphasia/encephalopathy/headaches: Secondary to brain metastases. Looks to be transient for now.  . stage IV left breast carcinoma now with new Brain metastases: Appreciate oncology  quick follow-up. I've had an extensive conversation with the patient and the family about palliative  care options. They were not prepared for this diagnosis and run to the impression initially that chemotherapy would cure her of this breast cancer. They're obviously quite devastated. Have consulted palliative care for goals of care. Oncology seeing. IV steroids. Spoke with neurology and at this time given no seizures, no immediate indication for antiseizure medication. Radiation oncology to see for palliative whole brain radiation.  Diabetes mellitus type 2: Mild. Given continuous steroids, have added sliding scale may need to titrate up.  Hypothyroid: Continue Synthroid  Morbid obesity: Patient meets criteria with BMI greater than 40  Consultants: Radiation oncology Oncology Neurology Palliative care  Code Status: Had extensive discussion. Full code for now  Family Communication: Multiple family members including husband at the bedside   Disposition Plan: Likely here for several days while we would determine radiation plans, goals of care  Time spent: 26 minutes  [facility] Pager [phone]

## 2015-04-03 NOTE — ED Notes (Signed)
Patient transported to CT 

## 2015-04-03 NOTE — ED Notes (Signed)
Patient transported to MRI 

## 2015-04-03 NOTE — ED Provider Notes (Signed)
CSN: [id number]     Arrival date & time 04/03/15  4163 History   First MD Initiated Contact with Patient 04/03/15 [phone]     Chief Complaint  Patient presents with  . Altered Mental Status     (Consider location/radiation/quality/duration/timing/severity/associated sxs/prior Treatment) HPI Comments: 59 year old female with history of hypothyroid, obesity, depression, breast cancer spreads along, pleural effusion, anemia presents with aphasia and not feeling right since last night. Patient had a few episodes of vomiting felt sick result around 8:00 last night. Patient woke up with mild improved symptoms however still difficulty with language. No history of stroke no history of this. No history of cancer spread to the brain. Patient follows with oncology closely and had an MRI planned for the coming week. Patient denies ambulation issues. Patient's last chemotherapy was Wednesday no new medicines. No fevers or chills. Symptoms fairly constant.  Patient is a 59 y.o. female presenting with altered mental status. The history is provided by the patient.  Altered Mental Status Associated symptoms: nausea and vomiting   Associated symptoms: no abdominal pain, no fever, no headaches, no light-headedness, no rash and no weakness     Past Medical History  Diagnosis Date  . ALLERGIC RHINITIS   . GERD (gastroesophageal reflux disease)   . Hypertension   . Hypothyroidism   . Hx of colonic polyps   . Wears glasses   . Radiation 09/15/13-11/01/13    Left chest wall/PAB/scar/supraclavicular fossa  . SYNCOPE 05/15/2010    Qualifier: Diagnosis of  By: Arnoldo Morale MD, [place], WITH RADICULOPATHY 10/01/2007    Qualifier: Diagnosis of  By: Arnoldo Morale MD, [facility] 05/31/2007    Qualifier: Diagnosis of  By: Arnoldo Morale MD, [place], HX OF 05/31/2007    Qualifier: Diagnosis of  By: Arnoldo Morale MD, [facility]   . Lymph edema L arm from breast ca tx 12/22/2013  . H/O blood clots    hx of small blood clots in both legs  . Diabetes mellitus without complication     type 2 - on Metformin  . Anxiety   . Neuropathy due to chemotherapeutic drug   . Breast cancer 34, age 64    right  . Breast cancer 09/30/12    left, ER/PR -, Her 2 -  . Secondary lung cancer 2016  . Anemia     during chemo  . History of IBS   . Glaucoma   . Dry skin   . Lymphedema of arm     left arm, post mastectomy  . Shortness of breath dyspnea     with exertion (since onset of pleural effusion) Wears O2 2 L as needed  . Chronic respiratory failure; On Home oxygen 2L since 12/03/2014 02/11/2015   Past Surgical History  Procedure Laterality Date  . Tonsillectomy    . Caesarean section      x 1  . Mastectomy  04/1987    right side  . Abdominal hysterectomy  06/1988    fibroids  . Portacath placement Right 10/28/2012    Procedure: PORT PLACEMENT;  Surgeon: Joyice Faster. Cornett, MD;  Location: [facility];  Service: General;  Laterality: Right;  Right Subclavian Vein  . Mastectomy modified radical Left 07/19/2013    Procedure: MASTECTOMY MODIFIED RADICAL;  Surgeon: Marcello Moores A. Cornett, MD;  Location: [place];  Service: General;  Laterality: Left;  . Port-a-cath removal Right 07/19/2013    Procedure:  REMOVAL PORT-A-CATH;  Surgeon: Joyice Faster. Cornett, MD;  Location: [facility];  Service: General;  Laterality: Right;  . Cesarean section    . Breast surgery Left 07/19/13    MRM  . Breast surgery Right     mastectomy  . Port-a-cath insertion  12/03/2014  . Colonoscopy    . Chest tube insertion Right 01/02/2015    Procedure: INSERTION PLEURAL DRAINAGE CATHETER RIGHT CHEST;  Surgeon: Gaye Pollack, MD;  Location: [facility];  Service: Thoracic;  Laterality: Right;  . Removal of pleural drainage catheter Right 01/16/2015    Procedure: REMOVAL OF PLEURAL DRAINAGE CATHETER;  Surgeon: Gaye Pollack, MD;  Location: [place];  Service: Thoracic;  Laterality: Right;  . [place]  pleurodesis Right 01/16/2015    Procedure: Pietro Cassis;  Surgeon: Gaye Pollack, MD;  Location: [facility];  Service: Thoracic;  Laterality: Right;   Family History  Problem Relation Age of Onset  . Colon cancer Mother 35    family hx of colon ca 1st degree relative <60  . Hypertension Mother   . Coronary artery disease Father     family hx of CAD female 1st degree relative <50  . Stomach cancer Neg Hx   . Rectal cancer Neg Hx   . Esophageal cancer Neg Hx   . Breast cancer Maternal Grandmother 80  . Diabetes Paternal Grandmother   . Breast cancer Cousin 24    maternal cousin  . Coronary artery disease Maternal Grandfather   . Breast cancer Sister     paternal half sister; died in her 30s   Social History  Substance Use Topics  . Smoking status: Never Smoker   . Smokeless tobacco: Never Used  . Alcohol Use: No   OB History    Obstetric Comments   meanrche age 23, G25 , p 1, menopause [age], no HRT     Review of Systems  Constitutional: Positive for fatigue. Negative for fever and chills.  HENT: Negative for congestion.   Eyes: Negative for visual disturbance.  Respiratory: Negative for shortness of breath.   Cardiovascular: Negative for chest pain.  Gastrointestinal: Positive for nausea and vomiting. Negative for abdominal pain.  Genitourinary: Negative for dysuria and flank pain.  Musculoskeletal: Negative for back pain, neck pain and neck stiffness.  Skin: Negative for rash.  Neurological: Positive for speech difficulty. Negative for weakness, light-headedness, numbness and headaches.      Allergies  Sulfa antibiotics; Sulfonamide derivatives; and Tomato  Home Medications   Prior to Admission medications   Medication Sig Start Date End Date Taking? Authorizing Provider  cetirizine (ZYRTEC) 10 MG tablet Take 10 mg by mouth daily as needed for allergies.    Yes Historical Provider, MD  cholecalciferol (VITAMIN D) 1000 UNITS tablet Take 1,000 Units by mouth every  morning.    Yes Historical Provider, MD  diltiazem (CARDIZEM CD) 120 MG 24 hr capsule Take 1 capsule (120 mg total) by mouth every morning. 01/04/15  Yes Lucretia Kern, DO  esomeprazole (NEXIUM) 20 MG capsule Take 20 mg by mouth daily as needed (acid).    Yes Historical Provider, MD  fluticasone (FLONASE) 50 MCG/ACT nasal spray Place 2 sprays into both nostrils daily as needed for allergies or rhinitis.   Yes Historical Provider, MD  ibuprofen (ADVIL,MOTRIN) 200 MG tablet Take 200 mg by mouth every 4 (four) hours as needed for headache.   Yes Historical Provider, MD  latanoprost (XALATAN) 0.005 % ophthalmic solution Place 1 drop  into both eyes at bedtime.    Yes Historical Provider, MD  levothyroxine (SYNTHROID, LEVOTHROID) 50 MCG tablet Take 1 tablet (50 mcg total) by mouth daily. 11/07/14  Yes Lucretia Kern, DO  lidocaine-prilocaine (EMLA) cream Apply to affected area once Patient taking differently: Apply 1 application topically as needed (for chemo). Apply to affected area once 11/16/14  Yes Nicholas Lose, MD  LORazepam (ATIVAN) 0.5 MG tablet Take 1 tablet (0.5 mg total) by mouth every 6 (six) hours as needed (Nausea or vomiting). 02/28/15  Yes Nicholas Lose, MD  metFORMIN (GLUCOPHAGE) 500 MG tablet Take 1000mg  (2 tabs) in the morning and 500mg  (1 tab) in the evening with meals 02/23/15  Yes Lucretia Kern, DO  naproxen sodium (ANAPROX) 220 MG tablet Take 220 mg by mouth daily as needed (pain).    Yes Historical Provider, MD  NON FORMULARY Apply 1 application topically as needed (FUNGIFOAM - Applies to feet for athlete's foot.).    Yes Historical Provider, MD  ondansetron (ZOFRAN) 8 MG tablet Take 1 tablet (8 mg total) by mouth 2 (two) times daily. Start the day after chemo for 2 days. Then take as needed for nausea or vomiting. 11/16/14  Yes Nicholas Lose, MD  oxyCODONE (OXY IR/ROXICODONE) 5 MG immediate release tablet Take 1 tablet (5 mg total) by mouth every 4 (four) hours as needed for moderate pain. 01/02/15   Yes Gaye Pollack, MD  potassium chloride SA (KLOR-CON M20) 20 MEQ tablet TAKE 1 TABLET (20 MEQ TOTAL) BY MOUTH 2 (TWO) TIMES DAILY. 02/06/15  Yes Nicholas Lose, MD  prochlorperazine (COMPAZINE) 10 MG tablet Take 1 tablet (10 mg total) by mouth every 6 (six) hours as needed (Nausea or vomiting). 11/16/14  Yes Nicholas Lose, MD  Tetrahydrozoline HCl (VISINE OP) Apply 1-2 drops to eye daily as needed (itchy eyes.).   Yes Historical Provider, MD  timolol (TIMOPTIC) 0.5 % ophthalmic solution Place 1 drop into both eyes 2 (two) times daily.   Yes Historical Provider, MD   BP 127/58 mmHg  Pulse 99  Temp(Src) 98.7 F (37.1 C) (Oral)  Resp 17  SpO2 95% Physical Exam  Constitutional: She is oriented to person, place, and time. She appears well-developed and well-nourished.  HENT:  Head: Normocephalic and atraumatic.  Eyes: Conjunctivae are normal. Right eye exhibits no discharge. Left eye exhibits no discharge.  Neck: Normal range of motion. Neck supple. No tracheal deviation present.  Cardiovascular: Normal rate and regular rhythm.   Pulmonary/Chest: Effort normal and breath sounds normal.  Abdominal: Soft. She exhibits no distension. There is no tenderness. There is no guarding.  Musculoskeletal: She exhibits no edema.  Neurological: She is alert and oriented to person, place, and time.  Patient alert and oriented, follows commands however has some difficulty with language both receptive and expressive. For example when asked to do finger to nose patient placed finger to 4 head multiple times.  Patient has mild slowness in responding to certain questions as well however answers majority of questions correctly. Patient moves all extremities equal 5+ strength on the stress. Sensation intact upper and lower extremities. Neck supple. Mild dysmetria on the right, mild decreased vision right lateral visual field  Skin: Skin is warm. No rash noted.  Psychiatric: She has a normal mood and affect.  Nursing  note and vitals reviewed.   ED Course  Procedures (including critical care time) Labs Review Labs Reviewed  BASIC METABOLIC PANEL - Abnormal; Notable for the following:    Potassium  3.2 (*)    Glucose, Bld 162 (*)    All other components within normal limits  CBC WITH DIFFERENTIAL/PLATELET - Abnormal; Notable for the following:    WBC 11.0 (*)    RBC 3.55 (*)    Hemoglobin 11.1 (*)    HCT 34.6 (*)    RDW 18.4 (*)    Monocytes Relative 13 (*)    nRBC 1 (*)    Monocytes Absolute 1.4 (*)    All other components within normal limits  URINALYSIS, ROUTINE W REFLEX MICROSCOPIC (NOT AT [facility]) - Abnormal; Notable for the following:    Color, Urine COLORLESS (*)    Specific Gravity, Urine 1.002 (*)    All other components within normal limits  CBG MONITORING, ED - Abnormal; Notable for the following:    Glucose-Capillary 158 (*)    All other components within normal limits  URINE CULTURE  TSH    Imaging Review Ct Head Wo Contrast  04/03/2015   CLINICAL DATA:  59 year old female with history of breast and lung cancer post chemotherapy and radiation therapy. Nausea with altered mental status and confusion since 6 p.m. 04/02/2015. History of high blood pressure. Initial encounter.  EXAM: CT HEAD WITHOUT CONTRAST  TECHNIQUE: Contiguous axial images were obtained from the base of the skull through the vertex without intravenous contrast.  COMPARISON:  05/03/2010 head CT.  FINDINGS: No intracranial hemorrhage.  New from prior CT are hypodensities within the right frontal and left parietal lobe. Although it is possible these findings represent result of small vessel disease type changes, given the patient's history of metastatic disease, contrast-enhanced imaging (preferably MR) may be considered to determine if this reflects result of intracranial metastatic disease.  No CT evidence of large acute infarct.  No hydrocephalus.  No discrete calvarial findings to suggest osseous metastatic disease with  calvarium having a similar appearance to prior CT.  Orbital structures unremarkable.  IMPRESSION: New from prior CT are hypodensities within the right frontal and left parietal lobe. Although it is possible these findings represent result of small vessel disease type changes, given the patient's history of metastatic disease, contrast-enhanced imaging (preferably MR) may be considered to determine if this reflects result of intracranial metastatic disease.   Electronically Signed   By: Genia Del M.D.   On: 04/03/2015 07:42   Mr [name] ML Contrast  04/03/2015   CLINICAL DATA:  Altered mental status. Symptoms began yesterday. History of breast cancer.  EXAM: MRI HEAD WITHOUT AND WITH CONTRAST  TECHNIQUE: Multiplanar, multiecho pulse sequences of the brain and surrounding structures were obtained without and with intravenous contrast.  CONTRAST:  43mL MULTIHANCE GADOBENATE DIMEGLUMINE 529 MG/ML IV SOLN  COMPARISON:  CT head earlier today.  FINDINGS: No acute stroke, acute hemorrhage, hydrocephalus, or extra-axial fluid. Normal for age cerebral volume. No evidence for small vessel disease.  Areas of vasogenic edema are seen throughout the supratentorial white matter, also affecting the BILATERAL cerebellum, greatest in the LEFT posterior parietal lobe. No midline abnormalities. No chronic hemorrhage. Flow voids are preserved.  Post infusion, there are multiple enhancing lesions throughout the brain representing metastatic breast cancer. These are estimated to be between 25 and 50 in number. Index lesion in the RIGHT cerebellum measures 22 x 11 x 12 mm (R-L x A-P x C-C). Index lesion in the supratentorial compartment, LEFT inferior and posterior parietal gray-white junction, measures 12 x 17 x 8 mm. No midline shift.  Extracranial soft tissues grossly unremarkable. No definite osseous lesions.  IMPRESSION: Widespread intracranial metastatic disease accounts for the CT findings. This affects both the cerebral  hemispheres as well as the cerebellum. Oncologic consultation is warranted. See discussion above.   Electronically Signed   By: Staci Righter M.D.   On: 04/03/2015 09:29   I have personally reviewed and evaluated these images and lab results as part of my medical decision-making.   EKG Interpretation   Date/Time:  Tuesday April 03 2015 07:12:24 EDT Ventricular Rate:  96 PR Interval:  114 QRS Duration: 81 QT Interval:  393 QTC Calculation: 497 R Axis:   -2 Text Interpretation:  Sinus rhythm Borderline short PR interval  Nonspecific T abnormalities, anterior leads Borderline prolonged QT  interval Confirmed by Reeva Davern  MD, Brendyn Mclaren ([id number]) on 04/03/2015 7:26:17 AM      MDM   Final diagnoses:  Brain metastases  Aphasia  Altered mental status, unspecified altered mental status type   Patient presents with mild altered mental status/aphasia, overall well-appearing no fevers. Concern for brain metastasis versus stroke. Patient out of any TPA window. Plan for CT/MRI, blood work and likely admission.  CT scan reviewed concerning for metastasis. MRI ordered for further delineation. Patient all improvement on reassessment. IV fluids given.  Discussed with radiologist regarding details of MRI and metastasis. Discussed with neurology and hospitalist for admission.   The patients results and plan were reviewed and discussed.   Any x-rays performed were independently reviewed by myself.   Differential diagnosis were considered with the presenting HPI.  Medications  sodium chloride 0.9 % bolus 1,000 mL (1,000 mLs Intravenous New Bag/Given 04/03/15 0809)  gadobenate dimeglumine (MULTIHANCE) injection 20 mL (17 mLs Intravenous Contrast Given 04/03/15 0901)    Filed Vitals:   04/03/15 0705 04/03/15 0934  BP: 118/61 127/58  Pulse: 97 99  Temp: 98.2 F (36.8 C) 98.7 F (37.1 C)  TempSrc: Oral Oral  Resp: 18 17  SpO2: 98% 95%    Final diagnoses:  Brain metastases  Aphasia  Altered  mental status, unspecified altered mental status type    Admission/ observation were discussed with the admitting physician, patient and/or family and they are comfortable with the plan.     Elnora Morrison, MD 04/03/15 [phone]

## 2015-04-03 NOTE — ED Notes (Signed)
RN to access port. Patient in CT now.

## 2015-04-04 ENCOUNTER — Ambulatory Visit
Admit: 2015-04-04 | Discharge: 2015-04-04 | Disposition: A | Discharge status: Home / Self Care | Payer: BLUE CROSS/BLUE SHIELD | Source: Ambulatory Visit | Attending: Radiation Oncology | Admitting: Radiation Oncology

## 2015-04-04 ENCOUNTER — Ambulatory Visit
Admit: 2015-04-04 | Discharge: 2015-04-04 | Disposition: A | Discharge status: Home / Self Care | Payer: BLUE CROSS/BLUE SHIELD | Attending: Radiation Oncology | Admitting: Radiation Oncology

## 2015-04-04 DIAGNOSIS — Z7189 Other specified counseling: Secondary | ICD-10-CM | POA: Diagnosis not present

## 2015-04-04 DIAGNOSIS — Z8249 Family history of ischemic heart disease and other diseases of the circulatory system: Secondary | ICD-10-CM | POA: Diagnosis not present

## 2015-04-04 DIAGNOSIS — H532 Diplopia: Secondary | ICD-10-CM | POA: Diagnosis present

## 2015-04-04 DIAGNOSIS — Z171 Estrogen receptor negative status [ER-]: Secondary | ICD-10-CM | POA: Diagnosis not present

## 2015-04-04 DIAGNOSIS — Z6830 Body mass index (BMI) 30.0-30.9, adult: Secondary | ICD-10-CM | POA: Diagnosis not present

## 2015-04-04 DIAGNOSIS — Z803 Family history of malignant neoplasm of breast: Secondary | ICD-10-CM | POA: Diagnosis not present

## 2015-04-04 DIAGNOSIS — K219 Gastro-esophageal reflux disease without esophagitis: Secondary | ICD-10-CM | POA: Diagnosis present

## 2015-04-04 DIAGNOSIS — C7931 Secondary malignant neoplasm of brain: Secondary | ICD-10-CM | POA: Diagnosis present

## 2015-04-04 DIAGNOSIS — R51 Headache: Secondary | ICD-10-CM | POA: Diagnosis present

## 2015-04-04 DIAGNOSIS — C50412 Malignant neoplasm of upper-outer quadrant of left female breast: Secondary | ICD-10-CM | POA: Diagnosis present

## 2015-04-04 DIAGNOSIS — E119 Type 2 diabetes mellitus without complications: Secondary | ICD-10-CM | POA: Diagnosis present

## 2015-04-04 DIAGNOSIS — R4701 Aphasia: Secondary | ICD-10-CM | POA: Diagnosis not present

## 2015-04-04 DIAGNOSIS — Z833 Family history of diabetes mellitus: Secondary | ICD-10-CM | POA: Diagnosis not present

## 2015-04-04 DIAGNOSIS — E669 Obesity, unspecified: Secondary | ICD-10-CM | POA: Diagnosis present

## 2015-04-04 DIAGNOSIS — Z79899 Other long term (current) drug therapy: Secondary | ICD-10-CM | POA: Diagnosis not present

## 2015-04-04 DIAGNOSIS — Z800 Family history of malignant neoplasm of digestive organs: Secondary | ICD-10-CM | POA: Diagnosis not present

## 2015-04-04 DIAGNOSIS — I10 Essential (primary) hypertension: Secondary | ICD-10-CM | POA: Diagnosis present

## 2015-04-04 DIAGNOSIS — C7800 Secondary malignant neoplasm of unspecified lung: Secondary | ICD-10-CM | POA: Diagnosis present

## 2015-04-04 DIAGNOSIS — Z9981 Dependence on supplemental oxygen: Secondary | ICD-10-CM | POA: Diagnosis not present

## 2015-04-04 DIAGNOSIS — E039 Hypothyroidism, unspecified: Secondary | ICD-10-CM | POA: Diagnosis present

## 2015-04-04 DIAGNOSIS — J9610 Chronic respiratory failure, unspecified whether with hypoxia or hypercapnia: Secondary | ICD-10-CM | POA: Diagnosis present

## 2015-04-04 DIAGNOSIS — Z515 Encounter for palliative care: Secondary | ICD-10-CM | POA: Diagnosis not present

## 2015-04-04 DIAGNOSIS — Z9011 Acquired absence of right breast and nipple: Secondary | ICD-10-CM | POA: Diagnosis present

## 2015-04-04 DIAGNOSIS — G936 Cerebral edema: Secondary | ICD-10-CM | POA: Diagnosis present

## 2015-04-04 DIAGNOSIS — G9340 Encephalopathy, unspecified: Secondary | ICD-10-CM | POA: Diagnosis present

## 2015-04-04 DIAGNOSIS — Z66 Do not resuscitate: Secondary | ICD-10-CM | POA: Diagnosis present

## 2015-04-04 LAB — GLUCOSE, CAPILLARY
GLUCOSE-CAPILLARY: 218 mg/dL — AB (ref 65–99)
GLUCOSE-CAPILLARY: 202 mg/dL — AB (ref 65–99)
Glucose-Capillary: 241 mg/dL — ABNORMAL HIGH (ref 65–99)

## 2015-04-04 LAB — URINE CULTURE

## 2015-04-04 MED ORDER — POTASSIUM CHLORIDE CRYS ER 20 MEQ PO TBCR
40.0000 meq | EXTENDED_RELEASE_TABLET | Freq: Once | ORAL | Status: AC
  Administered 2015-04-04: 40 meq via ORAL
  Filled 2015-04-04: qty 2

## 2015-04-04 MED ORDER — SIMETHICONE 80 MG PO CHEW: 80.0000 mg | CHEWABLE_TABLET | Freq: Four times a day (QID) | ORAL | Status: DC | PRN

## 2015-04-04 NOTE — Consult Note (Signed)
Consultation Note Date: 04/04/2015   Patient Name: Christine [name]  DOB: 1956-06-20  MRN: [id number]  Age / Sex: 59 y.o., female   PCP: Lucretia Kern, DO Referring Physician: Elmarie Shiley, MD  Reason for Consultation: Establishing goals of care  Palliative Care Assessment and Plan Summary of Established Goals of Care and Medical Treatment Preferences    Palliative Care Discussion Held Today:    This NP Wadie Lessen reviewed medical records, received report from team, assessed the patient and then meet at the patient's bedside along with her husband to discuss diagnosis, treatment options and  Goals of Care.  A  discussion was had today regarding advanced directives.  Concepts specific to code status was had.    Values and goals of care important to patient and family were attempted to be elicited.  Concept of Palliative Care was discussed    Questions and concerns addressed.   Family encouraged to call with questions or concerns.  PMT will continue to support holistically.   Primary Decision Maker: Patient herself with support of her husband   Goals of Care/Code Status/Advance Care Planning:   Code Status:  Full code Patient is open to all offered and available medical interventions to prolong life at this time, she is hopeful for more quality of time "before I leave this planet".  I was hoping to see my daughter (65 yo) get married and have children.   Psycho-social/Spiritual:   Support System:  Family  Desire for further Chaplaincy support:yes-will consult  Patient was able to express thoughts and feelings related to her own mortality.  She faced initially cancer as a very young women and it has always "been in the back of her mind".  She wishes to talk with her daughter in person regarding the progression of disease.   Discharge Planning:  Likely home with intention of continuing OP oncology treatments       Chief Complaint: Headache, nausea and vomiting, mental  status changes   History of Present Illness:  59 YO female with  past medical history of hypertension, hypothyroidism and recurrent breast carcinoma stage IV being treated with chemotherapy initially thought to have spread to lungs only presented to the emergency room today with one-day of difficulty word finding. Patient states she's been in good spirits and overall good health with not too many complications from her breast cancer treatment when she started having headaches intermittently several weeks ago. Symptoms have persisted and then she started having episodes of nausea and vomiting yesterday and difficulty talking. She also feels like her memory has been impaired as of late. She came into the emergency room and then CT scan further confirmed by MRI notes diffuse metastatic disease including the cerebellum. Neurology and hospitalists called for further evaluation.  Patient is faced with treatment option decisions,  Along with  advanced directive and anticipatory care decsions   DIAGNOSIS: Breast cancer of upper-outer quadrant of left female breast  Staging form: Breast, AJCC 7th Edition  Clinical: Stage IIIB (T4, N1, cM0) - Signed by Thea Silversmith, MD on 10/29/2012  Prognostic indicators: ER-, PR- HER2 -   Pathologic: No stage assigned - Unsigned  Prognostic indicators: ER-, PR- HER2 -    SUMMARY OF ONCOLOGIC HISTORY:   Breast cancer of upper-outer quadrant of left female breast   10/06/1986 Initial Diagnosis Right breast mastectomy, breast cancer diagnosed during pregnancy, no adjuvant treatment given   09/30/2012 Initial Biopsy Left breast biopsy done for left nipple retraction: Invasive ductal  carcinoma grade 3, ER/PR negative, HER-2 negative, Ki-67 21% with grade 3 DCIS   10/12/2012 Breast MRI Left breast diffuse enhancement measuring 13 x 12.5 x 9.5 cm with diffuse skin thickening and dermal enhancement, multiple enlarged level I left axillary lymph  nodes largest 2.4 cm   11/02/2012 - 06/06/2013 Neo-Adjuvant Chemotherapy Neoadjuvant FEC 4 followed by Taxol carbo 8 discontinued for neuropathy, Gemzar Carbo 2   09/15/2013 - 11/01/2013 Radiation Therapy Xeloda with adjuvant radiation   10/30/2014 Imaging Right hilar node 1.6 cm, subcarinal 1.2 cm, 1 cm additional right hilar, right lower lobe 3.2 cm, RUL 1 cm, and LDL of 1.3 cm, moderate pleural effusion, right liver 0.8 cm, 15 cm fluid collection left chest wall   10/31/2014 Initial Biopsy Lung biopsy right lower lobe: Carcinoma with Extensive necrosis, microscopic foci of nonnecrotic non-small cell, poorly differentiated carcinoma, metastatic disease likely ER 0%, PR 0%, HER-2 negative   12/01/2014 Procedure Second thoracentesis for malignant pleural effusion   12/06/2014 -  Chemotherapy [place] day 1 day 8 every 3 weeks   02/11/2015 - 02/13/2015 Hospital Admission Hospitalization for gastroenteritis, accompanied by nausea vomiting dehydration and hypokalemia          Primary Diagnoses  Present on Admission:  . Aphasia . Brain metastases . Altered mental status . Morbid obesity . Hypothyroidism . 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 Essential hypertension  Palliative Review of Systems:    - no positives reported at this time   I have reviewed the medical record, interviewed the patient and family, and examined the patient. The following aspects are pertinent.  Past Medical History  Diagnosis Date  . ALLERGIC RHINITIS   . GERD (gastroesophageal reflux disease)   . Hypertension   . Hypothyroidism   . Hx of colonic polyps   . Wears glasses   . Radiation 09/15/13-11/01/13    Left chest wall/PAB/scar/supraclavicular fossa  . SYNCOPE 05/15/2010    Qualifier: Diagnosis of  By: Arnoldo Morale MD, [place], WITH RADICULOPATHY 10/01/2007    Qualifier: Diagnosis of  By: Arnoldo Morale MD, [place] 05/31/2007    Qualifier: Diagnosis of  By:  Arnoldo Morale MD, [place], HX OF 05/31/2007    Qualifier: Diagnosis of  By: Arnoldo Morale MD, [place]inda [place]   . Lymph edema L arm from breast ca tx 12/22/2013  . H/O blood clots     hx of small blood clots in both legs  . Diabetes mellitus without complication     type 2 - on Metformin  . Anxiety   . Neuropathy due to chemotherapeutic drug   . Breast cancer 64, age 79    right  . Breast cancer 09/30/12    left, ER/PR -, Her 2 -  . Secondary lung cancer 2016  . Anemia     during chemo  . History of IBS   . Glaucoma   . Dry skin   . Lymphedema of arm     left arm, post mastectomy  . Shortness of breath dyspnea     with exertion (since onset of pleural effusion) Wears O2 2 L as needed  . Chronic respiratory failure; On Home oxygen 2L since 12/03/2014 02/11/2015   Social History   Social History  . Marital Status: Married    Spouse Name: Donnie  . Number of Children: 1  . Years of Education: N/A   Occupational History  . Unemployed    Social History  Main Topics  . Smoking status: Never Smoker   . Smokeless tobacco: Never Used  . Alcohol Use: No  . Drug Use: No  . Sexual Activity: Not Currently     Comment: menarche 89, 1st pregnancy age 33-miscarr, age 46 1st live birth, menop 58   Other Topics Concern  . None   Social History Narrative   Married. Lives w/ husband.  Has not worked since being diagnosed with cancer 2 years ago.  Previously worked as a Solicitor.  Independent with ADLs.      Spiritual Beliefs:Christian      Lifestyle: started the [employer] exercise program for cancer survivors (12/2013); working on diet            Family History  Problem Relation Age of Onset  . Colon cancer Mother 50    family hx of colon ca 1st degree relative <60  . Hypertension Mother   . Coronary artery disease Father     family hx of CAD female 1st degree relative <50  . Stomach cancer Neg Hx   . Rectal cancer Neg Hx   . Esophageal cancer Neg Hx   . Breast cancer  Maternal Grandmother 80  . Diabetes Paternal Grandmother   . Breast cancer Cousin 33    maternal cousin  . Coronary artery disease Maternal Grandfather   . Breast cancer Sister     paternal half sister; died in her 73s   Scheduled Meds: . dexamethasone  4 mg Intravenous 4 times per day  . diltiazem  120 mg Oral q morning - 10a  . insulin aspart  0-15 Units Subcutaneous TID WC  . insulin aspart  0-5 Units Subcutaneous QHS  . latanoprost  1 drop Both Eyes QHS  . levothyroxine  50 mcg Oral QAC breakfast  . timolol  1 drop Both Eyes BID   Continuous Infusions:  PRN Meds:.LORazepam, morphine injection, ondansetron **OR** ondansetron (ZOFRAN) IV, oxyCODONE, polyethylene glycol Medications Prior to Admission:  Prior to Admission medications   Medication Sig Start Date End Date Taking? Authorizing Provider  cetirizine (ZYRTEC) 10 MG tablet Take 10 mg by mouth daily as needed for allergies.    Yes Historical Provider, MD  cholecalciferol (VITAMIN D) 1000 UNITS tablet Take 1,000 Units by mouth every morning.    Yes Historical Provider, MD  diltiazem (CARDIZEM CD) 120 MG 24 hr capsule Take 1 capsule (120 mg total) by mouth every morning. 01/04/15  Yes Lucretia Kern, DO  esomeprazole (NEXIUM) 20 MG capsule Take 20 mg by mouth daily as needed (acid).    Yes Historical Provider, MD  fluticasone (FLONASE) 50 MCG/ACT nasal spray Place 2 sprays into both nostrils daily as needed for allergies or rhinitis.   Yes Historical Provider, MD  ibuprofen (ADVIL,MOTRIN) 200 MG tablet Take 200 mg by mouth every 4 (four) hours as needed for headache.   Yes Historical Provider, MD  latanoprost (XALATAN) 0.005 % ophthalmic solution Place 1 drop into both eyes at bedtime.    Yes Historical Provider, MD  levothyroxine (SYNTHROID, LEVOTHROID) 50 MCG tablet Take 1 tablet (50 mcg total) by mouth daily. 11/07/14  Yes Lucretia Kern, DO  lidocaine-prilocaine (EMLA) cream Apply to affected area once Patient taking differently:  Apply 1 application topically as needed (for chemo). Apply to affected area once 11/16/14  Yes Nicholas Lose, MD  LORazepam (ATIVAN) 0.5 MG tablet Take 1 tablet (0.5 mg total) by mouth every 6 (six) hours as needed (Nausea or vomiting). 02/28/15  Yes Nicholas Lose, MD  metFORMIN (GLUCOPHAGE) 500 MG tablet Take 1072m (2 tabs) in the morning and 502m(1 tab) in the evening with meals 02/23/15  Yes HaLucretia KernDO  naproxen sodium (ANAPROX) 220 MG tablet Take 220 mg by mouth daily as needed (pain).    Yes Historical Provider, MD  NON FORMULARY Apply 1 application topically as needed (FUNGIFOAM - Applies to feet for athlete's foot.).    Yes Historical Provider, MD  ondansetron (ZOFRAN) 8 MG tablet Take 1 tablet (8 mg total) by mouth 2 (two) times daily. Start the day after chemo for 2 days. Then take as needed for nausea or vomiting. 11/16/14  Yes ViNicholas LoseMD  oxyCODONE (OXY IR/ROXICODONE) 5 MG immediate release tablet Take 1 tablet (5 mg total) by mouth every 4 (four) hours as needed for moderate pain. 01/02/15  Yes BrGaye PollackMD  potassium chloride SA (KLOR-CON M20) 20 MEQ tablet TAKE 1 TABLET (20 MEQ TOTAL) BY MOUTH 2 (TWO) TIMES DAILY. 02/06/15  Yes ViNicholas LoseMD  prochlorperazine (COMPAZINE) 10 MG tablet Take 1 tablet (10 mg total) by mouth every 6 (six) hours as needed (Nausea or vomiting). 11/16/14  Yes ViNicholas LoseMD  Tetrahydrozoline HCl (VISINE OP) Apply 1-2 drops to eye daily as needed (itchy eyes.).   Yes Historical Provider, MD  timolol (TIMOPTIC) 0.5 % ophthalmic solution Place 1 drop into both eyes 2 (two) times daily.   Yes Historical Provider, MD   Allergies  Allergen Reactions  . Sulfa Antibiotics Hives, Itching and Swelling  . Sulfonamide Derivatives Hives, Itching and Swelling  . Tomato Hives and Itching   CBC:    Component Value Date/Time   WBC 11.0* 04/03/2015 0810   WBC 3.5* 03/28/2015 1246   HGB 11.1* 04/03/2015 0810   HGB 11.2* 03/28/2015 1246   HCT 34.6*  04/03/2015 0810   HCT 34.6* 03/28/2015 1246   PLT 189 04/03/2015 0810   PLT 174 03/28/2015 1246   MCV 97.5 04/03/2015 0810   MCV 95.8 03/28/2015 1246   NEUTROABS 7.7 04/03/2015 0810   NEUTROABS 1.8 03/28/2015 1246   LYMPHSABS 1.9 04/03/2015 0810   LYMPHSABS 1.2 03/28/2015 1246   MONOABS 1.4* 04/03/2015 0810   MONOABS 0.4 03/28/2015 1246   EOSABS 0.0 04/03/2015 0810   EOSABS 0.0 03/28/2015 1246   BASOSABS 0.0 04/03/2015 0810   BASOSABS 0.0 03/28/2015 1246   Comprehensive Metabolic Panel:    Component Value Date/Time   NA 143 04/03/2015 0810   NA 145 03/28/2015 1246   K 3.2* 04/03/2015 0810   K 3.3* 03/28/2015 1246   CL 101 04/03/2015 0810   CL 101 01/25/2013 0952   CO2 32 04/03/2015 0810   CO2 27 03/28/2015 1246   BUN 7 04/03/2015 0810   BUN 11.3 03/28/2015 1246   CREATININE 0.64 04/03/2015 0810   CREATININE 0.7 03/28/2015 1246   GLUCOSE 162* 04/03/2015 0810   GLUCOSE 101 03/28/2015 1246   GLUCOSE 169* 01/25/2013 0952   CALCIUM 9.6 04/03/2015 0810   CALCIUM 9.6 03/28/2015 1246   AST 26 03/28/2015 1246   AST 17 02/12/2015 0531   ALT 21 03/28/2015 1246   ALT 11* 02/12/2015 0531   ALKPHOS 81 03/28/2015 1246   ALKPHOS 78 02/12/2015 0531   BILITOT 0.65 03/28/2015 1246   BILITOT 1.1 02/12/2015 0531   PROT 6.8 03/28/2015 1246   PROT 6.3* 02/12/2015 0531   ALBUMIN 4.0 03/28/2015 1246   ALBUMIN 3.4* 02/12/2015 0531    Physical  Exam:  Vital Signs: BP 135/80 mmHg  Pulse 88  Temp(Src) 98.2 F (36.8 C) (Oral)  Resp 20  Ht _0  (1.626 m)  Wt 81.3 kg (179 lb 3.7 oz)  BMI 30.75 kg/m2  SpO2 92% SpO2: SpO2: 92 % O2 Device: O2 Device: Not Delivered O2 Flow Rate:   Intake/output summary:  Intake/Output Summary (Last 24 hours) at 04/04/15 1003 Last data filed at 04/03/15 2137  Gross per 24 hour  Intake    250 ml  Output      0 ml  Net    250 ml   LBM: Last BM Date: 04/02/15 Baseline Weight: Weight: 81.3 kg (179 lb 3.7 oz) Most recent weight: Weight: 81.3 kg (179  lb 3.7 oz)  Exam Findings:   General: NAD, alert HEENT: moist buccal membranes, no exudate CVS: RRR Resp: CTA Abd: soft NT +BS Extrem: without edema Skin: warm and dry Neuro: orietned to time and place, at times has difficulty retrieving familiar information           Palliative Performance Scale: 60 %                Additional Data Reviewed: Recent Labs     04/03/15  0810  WBC  11.0*  HGB  11.1*  PLT  189  NA  143  BUN  7  CREATININE  0.64     Time In: 0900 Time Out: 1030 Time Total: 90 minutes  Greater than 50%  of this time was spent counseling and coordinating care related to the above assessment and plan.  Discussed with  Dr  Tyrell Antonio  Signed by: Wadie Lessen, NP  Knox Royalty, NP  04/04/2015, 7:09 AM  Please contact Palliative Medicine Team phone at [phone] for questions and concerns.   See AMION for contact information

## 2015-04-04 NOTE — Progress Notes (Signed)
[place]  [facility]iation [facility]   Simulation and Treatment Planning Note   Name: Christine [name]    MRN: [id number]  Date: 04/04/2015    DOB: 1956/01/15  Status: Inpatient  DIAGNOSIS: Diffuse bilateral brain metastases  CONSENT VERIFIED: yes   SET UP: Patient is setup supine   IMMOBILIZATION: Following immobilization is used:Aquaplast Mask   NARRATIVE:The patient was brought to the [facility]. Identity was confirmed. All relevant records and images related to the planned course of therapy were reviewed. Then, the patient was positioned in a stable reproducible clinical set-up for radiation therapy. CT images were obtained. Marks were placed on the mask. The CT images were loaded into the planning software where the target (brain) and avoidance structures (globes) were contoured. The radiation prescription was entered and confirmed.   TREATMENT PLANNING NOTE:  Treatment planning then occurred.  I have requested : MLC's, isodose plan, basic dose calculation      Department of Radiation Oncology  Phone:  [phone] Fax:        [phone]   Name: Christine [name] MRN: [id number]  DOB: February 07, 1956  Date: 04/04/2015  Follow Up Visit Note  Diagnosis: Breast cancer of upper-outer quadrant of left female breast   Staging form: Breast, AJCC 7th Edition     Clinical: Stage IIIB (T4, N1, cM0) - Signed by Thea Silversmith, MD on 10/29/2012       Prognostic indicators: ER-, PR- HER2 -      Pathologic: No stage assigned - Unsigned       Prognostic indicators: ER-, PR- HER2 -   Summary and Interval since last radiation and chemotherapy: Breast cancer diagnosed in 1988. Radiation therapy to left completed in 2014.  Interval History: Christine [name] presents today for follow up. She was diagnosed with metastatic disease about a year ago which has been well controlled. She recently began complaining of headache and had decreased balance. An outpatient MRI was  planned. When her symptoms worsened acutely, she presented to the ER. CT and MRi were performed which showed numerous brain metastases. The patient stated that she is still struggling to read numbers and other short term memories issues. She was accompanied today by her husband. She is on decadron as an inpatient and is feeling better. She denies headaches and her husband states her balance is better.   Physical Exam:  Pleasant female in no distress. Alert and oriented. Pressured speech. 5/5 strength bilaterally in her upper and lower extremities. No thrush.    Vitals 03/28/15 03/21/15 02/28/15  BP 147/73 mmHg 145/75 mmHg 138/69 mmHg  Pulse Rate 101 91 106  Resp _0 Temp 98.1 F (36.7 C) 98.2 F (36.8 C) 97.8 F (36.6 C)  Temp Source Oral Oral Oral  SpO2 98 % 96 % 99 % [pt on 2 leters of oxygen]  Weight 186 lb 9.6 oz (84.641 kg) 185 lb 6.4 oz (84.097 kg) 187 lb 11.2 oz (85.14 kg)  Height _1  (1.626 m) _2  (1.626 m) _3  (1.626 m)  Pain Score 0 4 0         IMPRESSION: Christine [name] is a 59 y.o. female presenting to clinic in regards to her diffuse bilateral brain metastases. Treatment options dicussed included whole brain radiation and radiotherapy in regards to the management of her disease, moving forward.  PLAN:  We discussed whole brain radiation and alternatives such as no treatment and radiosurgery. We discussed acute and long term issues including skin  redness, hair loss, skin darkening and memory loss. She signed informed consent. We discussed steroids and the need to wean these off eventually. We were able to simulate her today and will start treatment tomorrow.    This document serves as a record of services personally performed by Thea Silversmith , MD. It was created on her behalf by Lenn Cal, a trained medical scribe. The creation of this record is based on the scribe's personal observations and the provider's statements to them. This document has been checked  and approved by the attending provider.   ________________________________  Thea Silversmith, MD

## 2015-04-04 NOTE — Progress Notes (Signed)
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  Oncology seeing. IV steroids.  Radiation oncology to see for palliative whole brain radiation.  Diabetes mellitus type 2: Mild. Given continuous steroids, SSI.  Hypothyroid: Continue Synthroid  Morbid obesity: Patient meets criteria with BMI greater than 40  Code Status: full code. Family Communication: care discussed with patient.  Disposition Plan: awaiting radiation  treatments.   Consultants:  Radiation oncologist.   Procedures:  none  Antibiotics:  none  HPI/Subjective: Had mild headaches today,speech better.   Objective: Filed Vitals:   04/04/15 1331  BP: 124/58  Pulse: 87  Temp: 97.7 F (36.5 C)  Resp: 20    Intake/Output Summary (Last 24 hours) at 04/04/15 1705 Last data filed at 04/04/15 1300  Gross per 24 hour  Intake    490 ml  Output      0 ml  Net    490 ml   Filed Weights   04/03/15 1024  Weight: 81.3 kg (179 lb 3.7 oz)    Exam:   General: Alert in no distress.   Cardiovascular: S 1, S 2 RRR  Respiratory: CTA  Abdomen: BS present, NT  Musculoskeletal: no edema.   Data Reviewed: Basic Metabolic Panel:  Recent Labs Lab 04/03/15 0810  NA 143  K 3.2*  CL 101  CO2 32  GLUCOSE 162*  BUN 7  CREATININE 0.64  CALCIUM 9.6   Liver Function Tests: No results for input(s): AST, ALT, ALKPHOS, BILITOT, PROT, ALBUMIN in the last 168 hours. No results for input(s): LIPASE, AMYLASE in the last 168 hours. No results for input(s): AMMONIA in the last 168 hours. CBC:  Recent Labs Lab 04/03/15 0810  WBC 11.0*  NEUTROABS 7.7  HGB 11.1*  HCT 34.6*  MCV 97.5  PLT 189   Cardiac Enzymes: No results for input(s): CKTOTAL,  CKMB, CKMBINDEX, TROPONINI in the last 168 hours. BNP (last 3 results)  Recent Labs  10/30/14 1300  BNP 55.3    ProBNP (last 3 results) No results for input(s): PROBNP in the last 8760 hours.  CBG:  Recent Labs Lab 04/03/15 1840 04/03/15 2139 04/04/15 0810 04/04/15 1145 04/04/15 1648  GLUCAP 143* 185* 218* 202* 241*    Recent Results (from the past 240 hour(s))  Urine culture     Status: None   Collection Time: 04/03/15  8:30 AM  Result Value Ref Range Status   Specimen Description URINE, RANDOM  Final   Special Requests NONE  Final   Culture   Final    2,000 COLONIES/mL INSIGNIFICANT GROWTH Performed at [facility]    Report Status 04/04/2015 FINAL  Final     Studies: Ct Head Wo Contrast  04/03/2015   CLINICAL DATA:  59 year old female with history of breast and lung cancer post chemotherapy and radiation therapy. Nausea with altered mental status and confusion since 6 p.m. 04/02/2015. History of high blood pressure. Initial encounter.  EXAM: CT HEAD WITHOUT CONTRAST  TECHNIQUE: Contiguous axial images were obtained from the base of the skull through the vertex without intravenous contrast.  COMPARISON:  05/03/2010 head CT.  FINDINGS: No intracranial hemorrhage.  New from prior CT are hypodensities within the right frontal and left parietal lobe. Although it is possible these findings represent result of small vessel disease type  changes, given the patient's history of metastatic disease, contrast-enhanced imaging (preferably MR) may be considered to determine if this reflects result of intracranial metastatic disease.  No CT evidence of large acute infarct.  No hydrocephalus.  No discrete calvarial findings to suggest osseous metastatic disease with calvarium having a similar appearance to prior CT.  Orbital structures unremarkable.  IMPRESSION: New from prior CT are hypodensities within the right frontal and left parietal lobe. Although it is possible these findings  represent result of small vessel disease type changes, given the patient's history of metastatic disease, contrast-enhanced imaging (preferably MR) may be considered to determine if this reflects result of intracranial metastatic disease.   Electronically Signed   By: Christine [name] Del M.D.   On: 04/03/2015 07:42   Mr Christine [name] OX Contrast  04/03/2015   CLINICAL DATA:  Altered mental status. Symptoms began yesterday. History of breast cancer.  EXAM: MRI HEAD WITHOUT AND WITH CONTRAST  TECHNIQUE: Multiplanar, multiecho pulse sequences of the brain and surrounding structures were obtained without and with intravenous contrast.  CONTRAST:  36mL MULTIHANCE GADOBENATE DIMEGLUMINE 529 MG/ML IV SOLN  COMPARISON:  CT head earlier today.  FINDINGS: No acute stroke, acute hemorrhage, hydrocephalus, or extra-axial fluid. Normal for age cerebral volume. No evidence for small vessel disease.  Areas of vasogenic edema are seen throughout the supratentorial white matter, also affecting the BILATERAL cerebellum, greatest in the LEFT posterior parietal lobe. No midline abnormalities. No chronic hemorrhage. Flow voids are preserved.  Post infusion, there are multiple enhancing lesions throughout the brain representing metastatic breast cancer. These are estimated to be between 25 and 50 in number. Index lesion in the RIGHT cerebellum measures 22 x 11 x 12 mm (R-L x A-P x C-C). Index lesion in the supratentorial compartment, LEFT inferior and posterior parietal gray-white junction, measures 12 x 17 x 8 mm. No midline shift.  Extracranial soft tissues grossly unremarkable. No definite osseous lesions.  IMPRESSION: Widespread intracranial metastatic disease accounts for the CT findings. This affects both the cerebral hemispheres as well as the cerebellum. Oncologic consultation is warranted. See discussion above.   Electronically Signed   By: Staci Righter M.D.   On: 04/03/2015 09:29    Scheduled Meds: . dexamethasone  4 mg Intravenous  4 times per day  . diltiazem  120 mg Oral q morning - 10a  . insulin aspart  0-15 Units Subcutaneous TID WC  . insulin aspart  0-5 Units Subcutaneous QHS  . latanoprost  1 drop Both Eyes QHS  . levothyroxine  50 mcg Oral QAC breakfast  . 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     Niel Hummer A  [facility] Pager [phone]. If 7PM-7AM, please contact night-coverage at [email or web address], password [facility] 04/04/2015, 5:05 PM  LOS: 0 days

## 2015-04-04 NOTE — Progress Notes (Signed)
   04/04/15 1500  Clinical Encounter Type  Visited With Family;Patient and family together  Visit Type Initial;Spiritual support (Counseling support)  Referral From Palliative care team  Consult/Referral To Chaplain  Recommendations Follow up for continued support around progression of illness for family and patient  Spiritual Encounters  Spiritual Needs Grief support;Emotional  Stress Factors  Patient Stress Factors (Brief contact with patient, no needs identified at this time)  Family Stress Factors Family relationships;Financial concerns;Health changes;Lack of knowledge;Major life changes;Exhausted    Referred through Palliative Care consult. Met with pt's sister and aunt for brief processing surrounding progression of pt illness. Pt joined later in conversation. Family expressed feelings of surprise and disbelief around the rapid progression of illness. Pt's aunt expressed some feelings of guilt over being unable to support her niece. Both family members indicated feelings of exhaustion and being overwhelmed. Family is hopeful and are relying on their faith but recognize the level of pt illness and said "if it's her time to go it's her time to go, and God has got her." Counselor asked if family had spoken with pt about level of illness and the reality of the situation, during which time the family indicated they had been realistic with the pt. Pt entered the room following radiation treatment and reported feeling tired but wanted a follow-up. Counselor will follow up with pt and family with goal of continued support around illness.  [place]

## 2015-04-05 ENCOUNTER — Ambulatory Visit
Admit: 2015-04-05 | Discharge: 2015-04-05 | Disposition: A | Discharge status: Home / Self Care | Payer: BLUE CROSS/BLUE SHIELD | Attending: Radiation Oncology | Admitting: Radiation Oncology

## 2015-04-05 LAB — GLUCOSE, CAPILLARY
GLUCOSE-CAPILLARY: 221 mg/dL — AB (ref 65–99)
GLUCOSE-CAPILLARY: 199 mg/dL — AB (ref 65–99)
GLUCOSE-CAPILLARY: 250 mg/dL — AB (ref 65–99)
Glucose-Capillary: 274 mg/dL — ABNORMAL HIGH (ref 65–99)
Glucose-Capillary: 302 mg/dL — ABNORMAL HIGH (ref 65–99)

## 2015-04-05 LAB — BASIC METABOLIC PANEL
ANION GAP: 8 (ref 5–15)
BUN: 16 mg/dL (ref 6–20)
CALCIUM: 8.9 mg/dL (ref 8.9–10.3)
CHLORIDE: 104 mmol/L (ref 101–111)
CO2: 30 mmol/L (ref 22–32)
CREATININE: 0.73 mg/dL (ref 0.44–1.00)
GFR calc non Af Amer: >60 mL/min (ref >60)
Glucose, Bld: 232 mg/dL — ABNORMAL HIGH (ref 65–99)
Potassium: 3.7 mmol/L (ref 3.5–5.1)
SODIUM: 142 mmol/L (ref 135–145)

## 2015-04-05 MED ORDER — INSULIN GLARGINE 100 UNIT/ML ~~LOC~~ SOLN
8.0000 [IU] | Freq: Every day | SUBCUTANEOUS | Status: DC
  Administered 2015-04-05: 8 [IU] via SUBCUTANEOUS
  Filled 2015-04-05 (×2): qty 0.08

## 2015-04-05 NOTE — Progress Notes (Signed)
Inpatient Diabetes Program Recommendations  AACE/ADA: New Consensus Statement on Inpatient Glycemic Control (2013)  Target Ranges:  Prepandial:   less than 140 mg/dL      Peak postprandial:   less than 180 mg/dL (1-2 hours)      Critically ill patients:  140 - 180 mg/dL   Results for MARYN, FREELOVE (MRN [id number]) as of 04/05/2015 10:03  Ref. Range 04/04/2015 08:10 04/04/2015 11:45 04/04/2015 16:48 04/04/2015 21:22 04/05/2015 07:45  Glucose-Capillary Latest Ref Range: 65-99 mg/dL 218 (H) 202 (H) 241 (H) 221 (H) 199 (H)    Reason for review: elevated CBG  Diabetes history: noted Outpatient Diabetes medications: Metformin 1000mg  qam, 500mg  qpm Current orders for Inpatient glycemic control: Novolog 0-15 units tid with meals and 0-5 units qhs  Blood sugars elevated likely as a result of steroids.  A1C on 02/11/15 was 6.3%.  Consider increasing Novolog to 0-20 units tid.  If the patient is going to remain on steroids, consider low dose basal insulin, Lantus 8 units qhs (0.1unit/kg).  Gentry Fitz, RN, BA, MHA, CDE Diabetes Coordinator Inpatient Diabetes Program  [phone] (Team Pager) [phone] ([place]) 04/05/2015 10:08 AM

## 2015-04-05 NOTE — Progress Notes (Signed)
[facility] PROGRESS NOTE  Christine [name] GEX:[id number] DOB: 1955/08/15 DOA: 04/03/2015 PCP: Lucretia Kern., DO  Assessment/Plan: Aphasia/encephalopathy/headaches: Secondary to brain metastases: improved.   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  Oncology seeing. IV steroids.  Started first treatment today.  Will Follow radiation oncology recommendation for discharge and oral decadron dose.   Diabetes mellitus type 2: Mild. Given continuous steroids, SSI.  Hypothyroid: Continue Synthroid  Morbid obesity: Patient meets criteria with BMI greater than 40  Code Status: full code. Family Communication: care discussed with patient.  Disposition Plan: awaiting radiation  treatments.   Consultants:  Radiation oncologist.   Procedures:  none  Antibiotics:  none  HPI/Subjective: Denies headaches, speech improved.  Feeling anxious from steroids.   Objective: Filed Vitals:   04/05/15 2028  BP: 127/79  Pulse: 78  Temp: 97.9 F (36.6 C)  Resp: 20    Intake/Output Summary (Last 24 hours) at 04/05/15 2120 Last data filed at 04/05/15 0859  Gross per 24 hour  Intake    590 ml  Output      0 ml  Net    590 ml   Filed Weights   04/03/15 1024  Weight: 81.3 kg (179 lb 3.7 oz)    Exam:   General: Alert in no distress.   Cardiovascular: S 1, S 2 RRR  Respiratory: CTA  Abdomen: BS present, NT  Musculoskeletal: no edema.   Data Reviewed: Basic Metabolic Panel:  Recent Labs Lab 04/03/15 0810 04/05/15 0515  NA 143 142  K 3.2* 3.7  CL 101 104  CO2 32 30  GLUCOSE 162* 232*  BUN 7 16  CREATININE 0.64 0.73  CALCIUM 9.6 8.9   Liver Function Tests: No results for input(s): AST, ALT, ALKPHOS, BILITOT, PROT, ALBUMIN in the last 168 hours. No results for input(s): LIPASE, AMYLASE in the last 168 hours. No results for input(s): AMMONIA in the last 168 hours. CBC:  Recent Labs Lab 04/03/15 0810   WBC 11.0*  NEUTROABS 7.7  HGB 11.1*  HCT 34.6*  MCV 97.5  PLT 189   Cardiac Enzymes: No results for input(s): CKTOTAL, CKMB, CKMBINDEX, TROPONINI in the last 168 hours. BNP (last 3 results)  Recent Labs  10/30/14 1300  BNP 55.3    ProBNP (last 3 results) No results for input(s): PROBNP in the last 8760 hours.  CBG:  Recent Labs Lab 04/04/15 1648 04/04/15 2122 04/05/15 0745 04/05/15 1134 04/05/15 1655  GLUCAP 241* 221* 199* 250* 274*    Recent Results (from the past 240 hour(s))  Urine culture     Status: None   Collection Time: 04/03/15  8:30 AM  Result Value Ref Range Status   Specimen Description URINE, RANDOM  Final   Special Requests NONE  Final   Culture   Final    2,000 COLONIES/mL INSIGNIFICANT GROWTH Performed at [facility]    Report Status 04/04/2015 FINAL  Final     Studies: No results found.  Scheduled Meds: . dexamethasone  4 mg Intravenous 4 times per day  . diltiazem  120 mg Oral q morning - 10a  . insulin aspart  0-15 Units Subcutaneous TID WC  . insulin aspart  0-5 Units Subcutaneous QHS  . latanoprost  1 drop Both Eyes QHS  . levothyroxine  50 mcg Oral QAC breakfast  . 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     Niel Hummer A  [facility] Pager [phone]. If 7PM-7AM, please contact night-coverage at [email or web address], password [facility] 04/05/2015, 9:20 PM  LOS: 1 day

## 2015-04-06 ENCOUNTER — Ambulatory Visit (HOSPITAL_COMMUNITY): Admission: RE | Admit: 2015-04-06 | Payer: BLUE CROSS/BLUE SHIELD | Source: Ambulatory Visit

## 2015-04-06 ENCOUNTER — Ambulatory Visit (HOSPITAL_COMMUNITY): Payer: BLUE CROSS/BLUE SHIELD

## 2015-04-06 ENCOUNTER — Ambulatory Visit
Admit: 2015-04-06 | Discharge: 2015-04-06 | Disposition: A | Discharge status: Home / Self Care | Payer: BLUE CROSS/BLUE SHIELD | Attending: Radiation Oncology | Admitting: Radiation Oncology

## 2015-04-06 LAB — GLUCOSE, CAPILLARY
Glucose-Capillary: 224 mg/dL — ABNORMAL HIGH (ref 65–99)
Glucose-Capillary: 308 mg/dL — ABNORMAL HIGH (ref 65–99)

## 2015-04-06 MED ORDER — DEXAMETHASONE 4 MG PO TABS: 4.0000 mg | ORAL_TABLET | Freq: Three times a day (TID) | ORAL | Status: DC

## 2015-04-06 MED ORDER — OXYCODONE HCL 5 MG PO TABS: 5.0000 mg | ORAL_TABLET | ORAL | Status: DC | PRN

## 2015-04-06 MED ORDER — HEPARIN SOD (PORK) LOCK FLUSH 100 UNIT/ML IV SOLN
500.0000 [IU] | INTRAVENOUS | Status: DC | PRN
  Filled 2015-04-06: qty 5

## 2015-04-06 MED ORDER — INSULIN GLARGINE 100 UNIT/ML ~~LOC~~ SOLN: 8.0000 [IU] | Freq: Every day | SUBCUTANEOUS | Status: DC

## 2015-04-06 MED ORDER — SODIUM CHLORIDE 0.9 % IV BOLUS (SEPSIS): 500.0000 mL | Freq: Once | INTRAVENOUS | Status: DC

## 2015-04-06 NOTE — Care Management Note (Signed)
Case Management Note  Patient Details  Name: Christine [name] MRN: [id number] Date of Birth: 08-13-1955  Subjective/Objective:        59 yo admitted with Aphasia            Action/Plan: From home with spouse  Expected Discharge Date:   (unknown)               Expected Discharge Plan:  [place]  In-House Referral:     Discharge planning Services  CM Consult  Post Acute Care Choice:    Choice offered to:  Patient  DME Arranged:    DME Agency:     HH Arranged:  RN [place] Agency:  [place]  Status of Service:  Completed, signed off  Medicare Important Message Given:    Date Medicare IM Given:    Medicare IM give by:    Date Additional Medicare IM Given:    Additional Medicare Important Message give by:     If discussed at [place] of Stay Meetings, dates discussed:    Additional Comments: MD asked this CM to set pt up with [facility] to check on her blood sugars at home. This CM met with pt at bedside to offer choice. Pt states she has used [place] before and would like to use them again. [employer] rep given referral. No other CM needs noted. Lynnell Catalan, RN 04/06/2015, 11:48 AM

## 2015-04-06 NOTE — Progress Notes (Signed)
Pt discharged home with spouse in stable condition. Discharge instructions and scripts given. Pt verbalized understanding 

## 2015-04-06 NOTE — Progress Notes (Signed)
Inpatient Diabetes Program Recommendations  AACE/ADA: New Consensus Statement on Inpatient Glycemic Control (2013)  Target Ranges:  Prepandial:   less than 140 mg/dL      Peak postprandial:   less than 180 mg/dL (1-2 hours)      Critically ill patients:  140 - 180 mg/dL   Reason for Visit: Hyperglycemia  Results for Christine [name], Christine [name] (MRN [id number]) as of 04/06/2015 14:29  Ref. Range 04/05/2015 11:34 04/05/2015 16:55 04/05/2015 22:45 04/06/2015 07:46 04/06/2015 12:14  Glucose-Capillary Latest Ref Range: 65-99 mg/dL 250 (H) 274 (H) 302 (H) 224 (H) 308 (H)    Inpatient Diabetes Program Recommendations Insulin - Basal: Add Lantus 10 units QHS Correction (SSI): Increase Novolog to resistant tidwc and hs  Note: Will follow. Thank you. Lorenda Peck, RD, LDN, CDE Inpatient Diabetes Coordinator [phone]

## 2015-04-06 NOTE — Discharge Summary (Signed)
Physician Discharge Summary  Christine [name] WHQ:[id number] DOB: September 06, 1955 DOA: 04/03/2015  PCP: Colin Benton R., DO  Admit date: 04/03/2015 Discharge date: 04/06/2015  Time spent: 35 minutes  Recommendations for Outpatient Follow-up:  1. Follow up with radiation oncologist for further treatment.  2. Follow up with Dr Burr Medico 3. Adjust insulin as needed.   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   Filed Weights   04/03/15 1024  Weight: 81.3 kg (179 lb 3.7 oz)    History of present illness:  Christine [name] is a 59 y.o. female  With past medical history of hypertension, hypothyroidism and recurrent breast carcinoma stage IV being treated with chemotherapy initially thought to have spread to lungs only presented to the emergency room today with one-day of difficulty word finding. Patient states she's been in good spirits and overall good health with not too many complications from her breast cancer treatment when she started having headaches intermittently several weeks ago. Symptoms have persisted and then she started having episodes of nausea and vomiting yesterday and difficulty talking. She also feels like her memory has been impaired as of late. She came into the emergency room and then CT scan further confirmed by MRI notes diffuse metastatic disease including the cerebellum. Neurology and hospitalists called for further evaluation.  Hospital Course:  Aphasia/encephalopathy/headaches, blurry vison: Secondary to brain metastases: improved.   Stage IV left breast carcinoma now with new Brain metastases: Appreciate oncology Follow up. Oncology seeing. Discharge on oral decadron 4 mg TID Has received a total of 2 radiation treatment.   Diabetes  mellitus type 2: Mild. Given continuous steroids, SSI. Discharge on low dose lantus. instructed patient to use while on decadron. [facility] nurse to help with education  Hypothyroid: Continue Synthroid  Morbid obesity: Patient meets criteria with BMI greater than 40  Procedures:  Radiation therapy  Consultations:  [facility]  Radiation oncology  Discharge Exam: Filed Vitals:   04/06/15 0452  BP: 134/83  Pulse: 90  Temp: 97.5 F (36.4 C)  Resp: 18    General: Alert in no distress.  Cardiovascular: S 1, S 2 RRR Respiratory: CTA  Discharge Instructions   Discharge Instructions    Diet Carb Modified    Complete by:  As directed      Increase activity slowly    Complete by:  As directed           Current Discharge Medication List    START taking these medications   Details  dexamethasone (DECADRON) 4 MG tablet Take 1 tablet (4 mg total) by mouth 3 (three) times daily. Qty: 90 tablet, Refills: 0    insulin glargine (LANTUS) 100 UNIT/ML injection Inject 0.08 mLs (8 Units total) into the skin at bedtime. Qty: 10 mL, Refills: 11      CONTINUE these medications which have CHANGED   Details  oxyCODONE (OXY IR/ROXICODONE) 5 MG immediate release tablet Take 1 tablet (5 mg total) by mouth every 4 (four) hours as needed for moderate pain. Qty: 30 tablet, Refills: 0      CONTINUE these medications which have NOT CHANGED   Details  cetirizine (ZYRTEC) 10 MG tablet Take 10 mg by mouth daily as needed for allergies.  cholecalciferol (VITAMIN D) 1000 UNITS tablet Take 1,000 Units by mouth every morning.     diltiazem (CARDIZEM CD) 120 MG 24 hr capsule Take 1 capsule (120 mg total) by mouth every morning. Qty: 90 capsule, Refills: 1    esomeprazole (NEXIUM) 20 MG capsule Take 20 mg by mouth daily as needed (acid).     fluticasone (FLONASE) 50 MCG/ACT nasal spray Place 2 sprays into both nostrils daily as needed for allergies or rhinitis.    latanoprost (XALATAN) 0.005 %  ophthalmic solution Place 1 drop into both eyes at bedtime.     levothyroxine (SYNTHROID, LEVOTHROID) 50 MCG tablet Take 1 tablet (50 mcg total) by mouth daily. Qty: 90 tablet, Refills: 3    lidocaine-prilocaine (EMLA) cream Apply to affected area once Qty: 30 g, Refills: 3   Associated Diagnoses: Bilateral breast cancer; Breast cancer of upper-outer quadrant of left female breast    LORazepam (ATIVAN) 0.5 MG tablet Take 1 tablet (0.5 mg total) by mouth every 6 (six) hours as needed (Nausea or vomiting). Qty: 30 tablet, Refills: 0   Associated Diagnoses: Bilateral breast cancer; Breast cancer of upper-outer quadrant of left female breast    metFORMIN (GLUCOPHAGE) 500 MG tablet Take 1000mg  (2 tabs) in the morning and 500mg  (1 tab) in the evening with meals Qty: 270 tablet, Refills: 3    ondansetron (ZOFRAN) 8 MG tablet Take 1 tablet (8 mg total) by mouth 2 (two) times daily. Start the day after chemo for 2 days. Then take as needed for nausea or vomiting. Qty: 30 tablet, Refills: 1   Associated Diagnoses: Bilateral breast cancer; Breast cancer of upper-outer quadrant of left female breast    prochlorperazine (COMPAZINE) 10 MG tablet Take 1 tablet (10 mg total) by mouth every 6 (six) hours as needed (Nausea or vomiting). Qty: 30 tablet, Refills: 1   Associated Diagnoses: Bilateral breast cancer; Breast cancer of upper-outer quadrant of left female breast    Tetrahydrozoline HCl (VISINE OP) Apply 1-2 drops to eye daily as needed (itchy eyes.).    timolol (TIMOPTIC) 0.5 % ophthalmic solution Place 1 drop into both eyes 2 (two) times daily.      STOP taking these medications     ibuprofen (ADVIL,MOTRIN) 200 MG tablet      naproxen sodium (ANAPROX) 220 MG tablet      NON FORMULARY      potassium chloride SA (KLOR-CON M20) 20 MEQ tablet        Allergies  Allergen Reactions  . Sulfa Antibiotics Hives, Itching and Swelling  . Sulfonamide Derivatives Hives, Itching and Swelling  .  Tomato Hives and Itching   Follow-up Information    Follow up with [facility].   Contact information:   [place] [address] [phone]       Follow up with Colin Benton R., DO In 1 week.   Specialty:  Family Medicine   Contact information:   [place] [address] [phone]        The results of significant diagnostics from this hospitalization (including imaging, microbiology, ancillary and laboratory) are listed below for reference.    Significant Diagnostic Studies: Ct Head Wo Contrast  04/03/2015   CLINICAL DATA:  59 year old female with history of breast and lung cancer post chemotherapy and radiation therapy. Nausea with altered mental status and confusion since 6 p.m. 04/02/2015. History of high blood pressure. Initial encounter.  EXAM: CT HEAD WITHOUT CONTRAST  TECHNIQUE: Contiguous axial  images were obtained from the base of the skull through the vertex without intravenous contrast.  COMPARISON:  05/03/2010 head CT.  FINDINGS: No intracranial hemorrhage.  New from prior CT are hypodensities within the right frontal and left parietal lobe. Although it is possible these findings represent result of small vessel disease type changes, given the patient's history of metastatic disease, contrast-enhanced imaging (preferably MR) may be considered to determine if this reflects result of intracranial metastatic disease.  No CT evidence of large acute infarct.  No hydrocephalus.  No discrete calvarial findings to suggest osseous metastatic disease with calvarium having a similar appearance to prior CT.  Orbital structures unremarkable.  IMPRESSION: New from prior CT are hypodensities within the right frontal and left parietal lobe. Although it is possible these findings represent result of small vessel disease type changes, given the patient's history of metastatic disease, contrast-enhanced imaging (preferably MR) may be  considered to determine if this reflects result of intracranial metastatic disease.   Electronically Signed   By: Genia Del M.D.   On: 04/03/2015 07:42   Mr [name] HG Contrast  04/03/2015   CLINICAL DATA:  Altered mental status. Symptoms began yesterday. History of breast cancer.  EXAM: MRI HEAD WITHOUT AND WITH CONTRAST  TECHNIQUE: Multiplanar, multiecho pulse sequences of the brain and surrounding structures were obtained without and with intravenous contrast.  CONTRAST:  1mL MULTIHANCE GADOBENATE DIMEGLUMINE 529 MG/ML IV SOLN  COMPARISON:  CT head earlier today.  FINDINGS: No acute stroke, acute hemorrhage, hydrocephalus, or extra-axial fluid. Normal for age cerebral volume. No evidence for small vessel disease.  Areas of vasogenic edema are seen throughout the supratentorial white matter, also affecting the BILATERAL cerebellum, greatest in the LEFT posterior parietal lobe. No midline abnormalities. No chronic hemorrhage. Flow voids are preserved.  Post infusion, there are multiple enhancing lesions throughout the brain representing metastatic breast cancer. These are estimated to be between 25 and 50 in number. Index lesion in the RIGHT cerebellum measures 22 x 11 x 12 mm (R-L x A-P x C-C). Index lesion in the supratentorial compartment, LEFT inferior and posterior parietal gray-white junction, measures 12 x 17 x 8 mm. No midline shift.  Extracranial soft tissues grossly unremarkable. No definite osseous lesions.  IMPRESSION: Widespread intracranial metastatic disease accounts for the CT findings. This affects both the cerebral hemispheres as well as the cerebellum. Oncologic consultation is warranted. See discussion above.   Electronically Signed   By: Staci Righter M.D.   On: 04/03/2015 09:29    Microbiology: Recent Results (from the past 240 hour(s))  Urine culture     Status: None   Collection Time: 04/03/15  8:30 AM  Result Value Ref Range Status   Specimen Description URINE, RANDOM  Final    Special Requests NONE  Final   Culture   Final    2,000 COLONIES/mL INSIGNIFICANT GROWTH Performed at [facility]    Report Status 04/04/2015 FINAL  Final     Labs: Basic Metabolic Panel:  Recent Labs Lab 04/03/15 0810 04/05/15 0515  NA 143 142  K 3.2* 3.7  CL 101 104  CO2 32 30  GLUCOSE 162* 232*  BUN 7 16  CREATININE 0.64 0.73  CALCIUM 9.6 8.9   Liver Function Tests: No results for input(s): AST, ALT, ALKPHOS, BILITOT, PROT, ALBUMIN in the last 168 hours. No results for input(s): LIPASE, AMYLASE in the last 168 hours. No results for input(s): AMMONIA in the last 168 hours. CBC:  Recent  Labs Lab 04/03/15 0810  WBC 11.0*  NEUTROABS 7.7  HGB 11.1*  HCT 34.6*  MCV 97.5  PLT 189   Cardiac Enzymes: No results for input(s): CKTOTAL, CKMB, CKMBINDEX, TROPONINI in the last 168 hours. BNP: BNP (last 3 results)  Recent Labs  10/30/14 1300  BNP 55.3    ProBNP (last 3 results) No results for input(s): PROBNP in the last 8760 hours.  CBG:  Recent Labs Lab 04/05/15 0745 04/05/15 1134 04/05/15 1655 04/05/15 2245 04/06/15 0746  GLUCAP 199* 250* 274* 302* 224*       Signed:  Regalado, Belkys A  [facility] 04/06/2015, 11:34 AM

## 2015-04-07 NOTE — Care Management Note (Addendum)
Case Management Note  Patient Details  Name: Christine [name] MRN: [id number] Date of Birth: 03/13/1956  Subjective/Objective:     DM               Action/Plan: Home Health. Pt called to state [facility] agency did not come last night to show her how to administer her insulin. NCM spoke to [place], Amy and [facility] RN is scheduled to come out today, 04/07/2015. Will follow up with pt on medication education and administration. Faxed dc summary and [place] RN orders to [place], # [phone]. NCM spoke to pt and made aware Arville Go is scheduled to come out 04/06/2015. Provided pt with [facility] contact number.    Expected Discharge Date:  04/06/2015               Expected Discharge Plan:  [place]  In-House Referral:     Discharge planning Services  CM Consult  Post Acute Care Choice:    Choice offered to:  Patient  DME Arranged:    DME Agency:     HH Arranged:  RN [place] Agency:  [place]  Status of Service:  Completed, signed off  Medicare Important Message Given:    Date Medicare IM Given:    Medicare IM give by:    Date Additional Medicare IM Given:    Additional Medicare Important Message give by:     If discussed at [place] of Stay Meetings, dates discussed:    Additional Comments: See previous NCM notes for more details. Erenest Rasher, RN 04/07/2015, 11:50 AM

## 2015-04-10 ENCOUNTER — Ambulatory Visit
Admit: 2015-04-10 | Discharge: 2015-04-10 | Disposition: A | Discharge status: Home / Self Care | Payer: BLUE CROSS/BLUE SHIELD | Attending: Radiation Oncology | Admitting: Radiation Oncology

## 2015-04-10 VITALS — BP 137/89 | HR 88 | Temp 97.8°F | Wt 173.0 lb

## 2015-04-10 DIAGNOSIS — C801 Malignant (primary) neoplasm, unspecified: Secondary | ICD-10-CM | POA: Diagnosis not present

## 2015-04-10 DIAGNOSIS — C7931 Secondary malignant neoplasm of brain: Secondary | ICD-10-CM

## 2015-04-10 MED ORDER — BIAFINE EX EMUL
CUTANEOUS | Status: DC | PRN
  Administered 2015-04-10: 13:00:00 via TOPICAL

## 2015-04-10 NOTE — Progress Notes (Addendum)
Weekly Management Note Current Dose: 7.5  Gy  Projected Dose: 35 Gy   Narrative:  The patient presents for routine under treatment assessment.  CBCT/MVCT images/Port film x-rays were reviewed.  The chart was checked. Dizzy due to high blood sugars she thinks. No headaches. Balance is stable.   Physical Findings: Weight: 173 lb (78.472 kg). Unchanged. No thrush.   Impression:  The patient is tolerating radiation.  Plan:  Continue treatment as planned. Decrease decadron to 4 mg bid. Instructions given.

## 2015-04-10 NOTE — Progress Notes (Signed)
Wt Readings from Last 3 Encounters:  04/10/15 173 lb (78.472 kg)  04/03/15 179 lb 3.7 oz (81.3 kg)  03/28/15 186 lb 9.6 oz (84.641 kg)  BP 137/89 mmHg  Pulse 88  Temp(Src) 97.8 F (36.6 C)  Wt 173 lb (78.472 kg)  Pt here for patient teaching.  Pt given Radiation and You booklet, skin care instructions and Biafine. Pt reports they have not watched the Radiation Therapy Education video on April 10, 2015.  Reviewed areas of pertinence such as  . Pt able to give teach back of to pat skin and drink plenty of water,apply Biafine bid and avoid applying anything to skin within 4 hours of treatment. Pt needs reinforcement and verbalizes understanding of information given and will contact nursing with any questions or concerns.   .Has some dizziness.Blood sugar has been in 200's since discharge from hospital.Knows to take anti-emetic if has nausea.Apply biafine when skin changes develop.

## 2015-04-10 NOTE — Patient Instructions (Signed)
Decrease dexamethasone to 4 mg BID.  If headaches, confusion, take 2 tablets of dexamethasone and then start tid.

## 2015-04-10 NOTE — Addendum Note (Signed)
Encounter addended by: Thea Silversmith, MD on: 04/10/2015  1:56 PM<BR>     Documentation filed: Notes Section, Problem List

## 2015-04-11 ENCOUNTER — Telehealth: Payer: Self-pay | Admitting: Hematology and Oncology

## 2015-04-11 ENCOUNTER — Other Ambulatory Visit (HOSPITAL_BASED_OUTPATIENT_CLINIC_OR_DEPARTMENT_OTHER): Payer: BLUE CROSS/BLUE SHIELD

## 2015-04-11 ENCOUNTER — Encounter: Payer: Self-pay | Admitting: Hematology and Oncology

## 2015-04-11 ENCOUNTER — Telehealth: Payer: Self-pay | Admitting: *Deleted

## 2015-04-11 ENCOUNTER — Ambulatory Visit: Payer: BLUE CROSS/BLUE SHIELD

## 2015-04-11 ENCOUNTER — Ambulatory Visit
Admission: RE | Admit: 2015-04-11 | Discharge: 2015-04-11 | Disposition: A | Discharge status: Home / Self Care | Payer: BLUE CROSS/BLUE SHIELD | Source: Ambulatory Visit | Attending: Radiation Oncology | Admitting: Radiation Oncology

## 2015-04-11 ENCOUNTER — Ambulatory Visit (HOSPITAL_BASED_OUTPATIENT_CLINIC_OR_DEPARTMENT_OTHER): Payer: BLUE CROSS/BLUE SHIELD | Admitting: Hematology and Oncology

## 2015-04-11 VITALS — BP 133/61 | HR 98 | Temp 98.3°F | Resp 18 | Ht 64.0 in | Wt 178.5 lb

## 2015-04-11 DIAGNOSIS — C50412 Malignant neoplasm of upper-outer quadrant of left female breast: Secondary | ICD-10-CM

## 2015-04-11 DIAGNOSIS — C7931 Secondary malignant neoplasm of brain: Secondary | ICD-10-CM

## 2015-04-11 DIAGNOSIS — C7800 Secondary malignant neoplasm of unspecified lung: Secondary | ICD-10-CM | POA: Diagnosis not present

## 2015-04-11 DIAGNOSIS — J90 Pleural effusion, not elsewhere classified: Secondary | ICD-10-CM

## 2015-04-11 DIAGNOSIS — C50919 Malignant neoplasm of unspecified site of unspecified female breast: Secondary | ICD-10-CM | POA: Insufficient documentation

## 2015-04-11 DIAGNOSIS — C771 Secondary and unspecified malignant neoplasm of intrathoracic lymph nodes: Secondary | ICD-10-CM

## 2015-04-11 DIAGNOSIS — E119 Type 2 diabetes mellitus without complications: Secondary | ICD-10-CM

## 2015-04-11 DIAGNOSIS — C50912 Malignant neoplasm of unspecified site of left female breast: Principal | ICD-10-CM

## 2015-04-11 DIAGNOSIS — Z510 Encounter for antineoplastic radiation therapy: Secondary | ICD-10-CM | POA: Insufficient documentation

## 2015-04-11 DIAGNOSIS — C50911 Malignant neoplasm of unspecified site of right female breast: Secondary | ICD-10-CM

## 2015-04-11 DIAGNOSIS — Z171 Estrogen receptor negative status [ER-]: Secondary | ICD-10-CM

## 2015-04-11 LAB — COMPREHENSIVE METABOLIC PANEL (CC13)
ALBUMIN: 3.7 g/dL (ref 3.5–5.0)
ALK PHOS: 125 U/L (ref 40–150)
ALT: 21 U/L (ref 0–55)
AST: 14 U/L (ref 5–34)
Anion Gap: 11 mEq/L (ref 3–11)
BILIRUBIN TOTAL: 0.74 mg/dL (ref 0.20–1.20)
BUN: 34.4 mg/dL — AB (ref 7.0–26.0)
CALCIUM: 9.4 mg/dL (ref 8.4–10.4)
CO2: 21 mEq/L — ABNORMAL LOW (ref 22–29)
CREATININE: 0.9 mg/dL (ref 0.6–1.1)
Chloride: 104 mEq/L (ref 98–109)
EGFR: 85 mL/min/{1.73_m2} — ABNORMAL LOW (ref >90)
Glucose: 268 mg/dl — ABNORMAL HIGH (ref 70–140)
Potassium: 4.9 mEq/L (ref 3.5–5.1)
Sodium: 136 mEq/L (ref 136–145)
Total Protein: 6.3 g/dL — ABNORMAL LOW (ref 6.4–8.3)

## 2015-04-11 LAB — CBC WITH DIFFERENTIAL/PLATELET
BASO%: 1.1 % (ref 0.0–2.0)
BASOS ABS: 0.4 10*3/uL — AB (ref 0.0–0.1)
EOS%: 0.1 % (ref 0.0–7.0)
Eosinophils Absolute: 0 10*3/uL (ref 0.0–0.5)
HEMATOCRIT: 41.8 % (ref 34.8–46.6)
HEMOGLOBIN: 13.3 g/dL (ref 11.6–15.9)
LYMPH#: 0.6 10*3/uL — AB (ref 0.9–3.3)
LYMPH%: 1.6 % — ABNORMAL LOW (ref 14.0–49.7)
MCH: 30.9 pg (ref 25.1–34.0)
MCHC: 31.7 g/dL (ref 31.5–36.0)
MCV: 97.6 fL (ref 79.5–101.0)
MONO#: 0.5 10*3/uL (ref 0.1–0.9)
MONO%: 1.5 % (ref 0.0–14.0)
NEUT#: 33.6 10*3/uL — ABNORMAL HIGH (ref 1.5–6.5)
NEUT%: 95.7 % — ABNORMAL HIGH (ref 38.4–76.8)
PLATELETS: 107 10*3/uL — AB (ref 145–400)
RBC: 4.29 10*6/uL (ref 3.70–5.45)
RDW: 20.9 % — AB (ref 11.2–14.5)
WBC: 35.1 10*3/uL — ABNORMAL HIGH (ref 3.9–10.3)

## 2015-04-11 NOTE — Telephone Encounter (Signed)
Gave avs & calendar for September. Patient aware radiology will call and schedule

## 2015-04-11 NOTE — Progress Notes (Signed)
Patient Care Team: Lucretia Kern, DO as PCP - General (Family Medicine)  DIAGNOSIS: Breast cancer of upper-outer quadrant of left female breast   Staging form: Breast, AJCC 7th Edition     Clinical: Stage IIIB (T4, N1, cM0) - Signed by Thea Silversmith, MD on 10/29/2012       Prognostic indicators: ER-, PR- HER2 -      Pathologic: No stage assigned - Unsigned       Prognostic indicators: ER-, PR- HER2 -    SUMMARY OF ONCOLOGIC HISTORY:   Breast cancer of upper-outer quadrant of left female breast   10/06/1986 Initial Diagnosis Right breast mastectomy, breast cancer diagnosed during pregnancy, no adjuvant treatment given   09/30/2012 Initial Biopsy Left breast biopsy done for left nipple retraction: Invasive ductal carcinoma grade 3, ER/PR negative, HER-2 negative, Ki-67 21% with grade 3 DCIS   10/12/2012 Breast MRI Left breast diffuse enhancement measuring 13 x 12.5 x 9.5 cm with diffuse skin thickening and dermal enhancement, multiple enlarged level I left axillary lymph nodes largest 2.4 cm   11/02/2012 - 06/06/2013 Neo-Adjuvant Chemotherapy Neoadjuvant FEC 4 followed by Taxol carbo 8 discontinued for neuropathy, Gemzar Carbo 2   09/15/2013 - 11/01/2013 Radiation Therapy Xeloda with adjuvant radiation   10/30/2014 Imaging Right hilar node 1.6 cm, subcarinal 1.2 cm, 1 cm additional right hilar, right lower lobe 3.2 cm, RUL 1 cm, and LDL of 1.3 cm, moderate pleural effusion, right liver 0.8 cm, 15 cm fluid collection left chest wall   10/31/2014 Initial Biopsy Lung biopsy right lower lobe: Carcinoma with Extensive necrosis, microscopic foci of nonnecrotic non-small cell, poorly differentiated carcinoma, metastatic disease likely ER 0%, PR 0%, HER-2 negative   12/01/2014 Procedure Second thoracentesis for malignant pleural effusion   12/06/2014 -  Chemotherapy Halaven day 1 day 8 every 3 weeks   02/11/2015 - 02/13/2015 Hospital Admission Hospitalization for gastroenteritis, accompanied by nausea vomiting  dehydration and hypokalemia   04/06/2015 Relapse/Recurrence Brain MRI showing diffuse bilateral brain metastasis   04/06/2015 -  Radiation Therapy Palliative radiation therapy to the brain 15 fractions    CHIEF COMPLIANT: Brain metastases getting palliative radiation  INTERVAL HISTORY: Christine [name] is a 58 year old lady with above-mentioned history of metastatic breast cancer who is currently on Halaven chemotherapy. She was admitted to the hospital with severe mental status changes and confusion and a brain MRI revealed bilateral brain metastases. She was started on sterile and she is currently receiving palliative radiation therapy to the brain. She reports that the majority of the headaches and mental status changes are resolved. She complains of dizziness. Because this device a blood pressure and blood sugars have gone up significantly. She is currently on insulin at bedtime to decrease her blood sugars.  REVIEW OF SYSTEMS:   Constitutional: Denies fevers, chills or abnormal weight loss Eyes: Denies blurriness of vision Ears, nose, mouth, throat, and face: Denies mucositis or sore throat Respiratory: Denies cough, dyspnea or wheezes Cardiovascular: Denies palpitation, chest discomfort or lower extremity swelling Gastrointestinal:  Denies nausea, heartburn or change in bowel habits Skin: Denies abnormal skin rashes Lymphatics: Denies new lymphadenopathy or easy bruising Neurological: Dizziness Behavioral/Psych: Mood is stable, no new changes   All other systems were reviewed with the patient and are negative.  I have reviewed the past medical history, past surgical history, social history and family history with the patient and they are unchanged from previous note.  ALLERGIES:  is allergic to sulfa antibiotics; sulfonamide derivatives; and tomato.  MEDICATIONS:  Current Outpatient Prescriptions  Medication Sig Dispense Refill  . cetirizine (ZYRTEC) 10 MG tablet Take 10 mg by mouth  daily as needed for allergies.     . cholecalciferol (VITAMIN D) 1000 UNITS tablet Take 1,000 Units by mouth every morning.     Marland Kitchen dexamethasone (DECADRON) 4 MG tablet Take 1 tablet (4 mg total) by mouth 3 (three) times daily. 90 tablet 0  . diltiazem (CARDIZEM CD) 120 MG 24 hr capsule Take 1 capsule (120 mg total) by mouth every morning. 90 capsule 1  . esomeprazole (NEXIUM) 20 MG capsule Take 20 mg by mouth daily as needed (acid).     . fluticasone (FLONASE) 50 MCG/ACT nasal spray Place 2 sprays into both nostrils daily as needed for allergies or rhinitis.    Marland Kitchen insulin glargine (LANTUS) 100 UNIT/ML injection Inject 0.08 mLs (8 Units total) into the skin at bedtime. 10 mL 11  . KLOR-CON M20 20 MEQ tablet     . latanoprost (XALATAN) 0.005 % ophthalmic solution Place 1 drop into both eyes at bedtime.     Marland Kitchen levothyroxine (SYNTHROID, LEVOTHROID) 50 MCG tablet Take 1 tablet (50 mcg total) by mouth daily. 90 tablet 3  . lidocaine-prilocaine (EMLA) cream Apply to affected area once (Patient taking differently: Apply 1 application topically as needed (for chemo). Apply to affected area once) 30 g 3  . LORazepam (ATIVAN) 0.5 MG tablet Take 1 tablet (0.5 mg total) by mouth every 6 (six) hours as needed (Nausea or vomiting). 30 tablet 0  . metFORMIN (GLUCOPHAGE) 500 MG tablet Take $RemoveBef'1000mg'UaHNwaaNEv$  (2 tabs) in the morning and $RemoveBef'500mg'SbLDqMlbfv$  (1 tab) in the evening with meals 270 tablet 3  . ondansetron (ZOFRAN) 8 MG tablet Take 1 tablet (8 mg total) by mouth 2 (two) times daily. Start the day after chemo for 2 days. Then take as needed for nausea or vomiting. 30 tablet 1  . oxyCODONE (OXY IR/ROXICODONE) 5 MG immediate release tablet Take 1 tablet (5 mg total) by mouth every 4 (four) hours as needed for moderate pain. (Patient not taking: Reported on 04/10/2015) 30 tablet 0  . prochlorperazine (COMPAZINE) 10 MG tablet Take 1 tablet (10 mg total) by mouth every 6 (six) hours as needed (Nausea or vomiting). (Patient not taking: Reported  on 04/10/2015) 30 tablet 1  . Tetrahydrozoline HCl (VISINE OP) Apply 1-2 drops to eye daily as needed (itchy eyes.).    Marland Kitchen timolol (TIMOPTIC) 0.5 % ophthalmic solution Place 1 drop into both eyes 2 (two) times daily.     No current facility-administered medications for this visit.    PHYSICAL EXAMINATION: ECOG PERFORMANCE STATUS: 1 - Symptomatic but completely ambulatory  Filed Vitals:   04/11/15 1017  BP: 133/61  Pulse: 98  Temp: 98.3 F (36.8 C)  Resp: 18   Filed Weights   04/11/15 1017  Weight: 178 lb 8 oz (80.967 kg)    GENERAL:alert, no distress and comfortable SKIN: skin color, texture, turgor are normal, no rashes or significant lesions EYES: normal, Conjunctiva are pink and non-injected, sclera clear OROPHARYNX:no exudate, no erythema and lips, buccal mucosa, and tongue normal  NECK: supple, thyroid normal size, non-tender, without nodularity LYMPH:  no palpable lymphadenopathy in the cervical, axillary or inguinal LUNGS: clear to auscultation and percussion with normal breathing effort HEART: regular rate & rhythm and no murmurs and no lower extremity edema ABDOMEN:abdomen soft, non-tender and normal bowel sounds Musculoskeletal:no cyanosis of digits and no clubbing  NEURO: alert & oriented x 3 with fluent  speech, no focal motor/sensory deficits,  dizziness  LABORATORY DATA:  I have reviewed the data as listed   Chemistry      Component Value Date/Time   NA 136 04/11/2015 1000   NA 142 04/05/2015 0515   K 4.9 04/11/2015 1000   K 3.7 04/05/2015 0515   CL 104 04/05/2015 0515   CL 101 01/25/2013 0952   CO2 21* 04/11/2015 1000   CO2 30 04/05/2015 0515   BUN 34.4* 04/11/2015 1000   BUN 16 04/05/2015 0515   CREATININE 0.9 04/11/2015 1000   CREATININE 0.73 04/05/2015 0515      Component Value Date/Time   CALCIUM 9.4 04/11/2015 1000   CALCIUM 8.9 04/05/2015 0515   ALKPHOS 125 04/11/2015 1000   ALKPHOS 78 02/12/2015 0531   AST 14 04/11/2015 1000   AST 17  02/12/2015 0531   ALT 21 04/11/2015 1000   ALT 11* 02/12/2015 0531   BILITOT 0.74 04/11/2015 1000   BILITOT 1.1 02/12/2015 0531       Lab Results  Component Value Date   WBC 35.1* 04/11/2015   HGB 13.3 04/11/2015   HCT 41.8 04/11/2015   MCV 97.6 04/11/2015   PLT 107* 04/11/2015   NEUTROABS 33.6* 04/11/2015   ASSESSMENT & PLAN:  Breast cancer of upper-outer quadrant of left female breast 09/30/2012 :Stage III, triple negative, invasive ductal carcinoma of the left breast. Neoadjuvant chemotherapy following the surgery followed by adjuvant radiation with concurrent Xeloda. 3.9 cm size and 9/13 lymph nodes were positive with extracapsular extension. ER/PR negative HER-2 negative Ki-67 21% March 2016: CT chest revealed bilateral lung nodules biopsy-proven to be triple negative metastatic carcinoma , pleural effusion status post thoracentesis malignant cells on cytology, repeat thoracentesis 12/01/2014 and 12/12/2014 PET CT scan 11/27/2014 showed widespread metastatic disease to the thorax with multiple pulmonary nodules and masses and malignant right pleural effusion and right hilar and mediastinal malignant lymph nodes. -------------------------------------------------------------------------------------------------------------------------------- Current Treatment: Cycle 5 day 8 Halaven (days 1,8 q 3 weeks) started 12/06/14 Shortness of breath: Related to malignant pleural effusion status post 3 thoracentesis. Pleurx catheter placed 01/02/2015 by Dr. Cyndia Bent.  Brain metastases: Patient is currently undergoing palliative radiation therapy. Complaining of dizziness. Steroid-induced doses are being tapered to 3 times a day.  Diabetes mellitus: Because posterior and so blood sugars were markedly elevated and she is currently receiving insulin injections the evening. But her blood sugars are still persistently elevated. Decadron dose has been reduced. We elevated that makes any difference. If  not she will contact her primary care doctor to get her blood sugars under better control.  Treatment plan: 1. Set her up for PET CT scan 2. Complete brain radiation 3. Plan further chemotherapy depending upon the PET/CT results  Return to clinic in 2 weeks for follow-up     No orders of the defined types were placed in this encounter.   The patient has a good understanding of the overall plan. she agrees with it. she will call with any problems that may develop before the next visit here.   Rulon Eisenmenger, MD

## 2015-04-11 NOTE — Telephone Encounter (Signed)
Letter written per patient's specifications, signed by Dr. Lindi Adie and mailed out to patient.

## 2015-04-11 NOTE — Telephone Encounter (Signed)
Call from patient requesting "another letter for disability as a medical emergency".  Forwarded to [employer] and collaborative nurse.

## 2015-04-11 NOTE — Assessment & Plan Note (Signed)
09/30/2012 :Stage III, triple negative, invasive ductal carcinoma of the left breast. Neoadjuvant chemotherapy following the surgery followed by adjuvant radiation with concurrent Xeloda. 3.9 cm size and 9/13 lymph nodes were positive with extracapsular extension. ER/PR negative HER-2 negative Ki-67 21% March 2016: CT chest revealed bilateral lung nodules biopsy-proven to be triple negative metastatic carcinoma , pleural effusion status post thoracentesis malignant cells on cytology, repeat thoracentesis 12/01/2014 and 12/12/2014 PET CT scan 11/27/2014 showed widespread metastatic disease to the thorax with multiple pulmonary nodules and masses and malignant right pleural effusion and right hilar and mediastinal malignant lymph nodes. -------------------------------------------------------------------------------------------------------------------------------- Current Treatment: Cycle 5 day 8 Halaven (days 1,8 q 3 weeks) started 12/06/14 Shortness of breath: Related to malignant pleural effusion status post 3 thoracentesis. Pleurx catheter placed 01/02/2015 by Dr. Cyndia Bent.  Brain metastases: Patient is currently undergoing palliative radiation therapy. Complaining of dizziness. Steroid-induced doses are being tapered to 3 times a day.  Diabetes mellitus: Because posterior and so blood sugars were markedly elevated and she is currently receiving insulin injections the evening. But her blood sugars are still persistently elevated. Decadron dose has been reduced. We elevated that makes any difference. If not she will contact her primary care doctor to get her blood sugars under better control.  Treatment plan: 1. Set her up for PET CT scan 2. Complete brain radiation 3. Plan further chemotherapy depending upon the PET/CT results  Return to clinic in 2 weeks for follow-up

## 2015-04-12 ENCOUNTER — Ambulatory Visit
Admission: RE | Admit: 2015-04-12 | Discharge: 2015-04-12 | Disposition: A | Discharge status: Home / Self Care | Payer: BLUE CROSS/BLUE SHIELD | Source: Ambulatory Visit | Attending: Radiation Oncology | Admitting: Radiation Oncology

## 2015-04-12 ENCOUNTER — Telehealth: Payer: Self-pay | Admitting: Family Medicine

## 2015-04-12 DIAGNOSIS — Z510 Encounter for antineoplastic radiation therapy: Secondary | ICD-10-CM | POA: Diagnosis not present

## 2015-04-12 NOTE — Telephone Encounter (Signed)
Increase her Metformin to 500 mg two po twice daily and confirm if she is taking Lantus.  If currently at 8 units once daily, increase that to 10 units .

## 2015-04-12 NOTE — Telephone Encounter (Signed)
Pt call to say that she is taking steroids for brain cancer  and it is making her sugar up. And was told by the nurse at [facility] to call her pcp and asked her if metformin can be adjusted doing the day to see if that would help control her sugar. Would like a call back.

## 2015-04-12 NOTE — Telephone Encounter (Signed)
I talked to Dr Elease Hashimoto and informed him of the message below and he stated to let the pt know the readings are high but not dangerously high and steriods can increase this.  He stated it depends on how long she will be on steriods and she should be evaluated to maybe start a medication short term to help with her sugar and in the meantime watch her carbs, etc.  She was informed of this and agreed and stated she has an appt with the cancer center tomorrow and has a follow up with Dr Maudie Mercury on next Thursday.

## 2015-04-12 NOTE — Telephone Encounter (Signed)
I did not realize she was already on Metformin.  Would go ahead and increase her Metformin to 500 mg 2 twice daily Also confirm is she is taking Lantus

## 2015-04-12 NOTE — Telephone Encounter (Signed)
I called the pt and she stated her blood sugars have been on average 230-260s fasting in the am since she started steriod 6 days ago.

## 2015-04-13 ENCOUNTER — Ambulatory Visit
Admission: RE | Admit: 2015-04-13 | Discharge: 2015-04-13 | Disposition: A | Discharge status: Home / Self Care | Payer: BLUE CROSS/BLUE SHIELD | Source: Ambulatory Visit | Attending: Radiation Oncology | Admitting: Radiation Oncology

## 2015-04-13 ENCOUNTER — Encounter: Payer: Self-pay | Admitting: Radiation Oncology

## 2015-04-13 DIAGNOSIS — Z510 Encounter for antineoplastic radiation therapy: Secondary | ICD-10-CM | POA: Diagnosis not present

## 2015-04-13 NOTE — Telephone Encounter (Signed)
Pt informed. Pt verbally understands.

## 2015-04-13 NOTE — Progress Notes (Signed)
Patient left message about some disability forms? I don't have any. I called her and left a message if she is doing radiation she needs to contact meredith and I have her ph#. If she is doing chemo it would have come to me.

## 2015-04-13 NOTE — Progress Notes (Signed)
Patient called at end of day on Thursday 04/12/15 to state Christine [name] was still dizzy and blood sugar still in 200's.Dexamethsone was decreased from three times daily to twice daily on 04/10/15.Informed her it would take more than 2 days for change in blood sugar to change, but to stop by today so I could do vitals.Orthostatic vitals are good.Patient states CBG down in 100's this morning and Christine [name] is feeling better.Christine [name] will see Dr.Wentworth Thursday of next week but knows to inform therapist if Christine [name] needs assessment earlier.  BP 123/55 mmHg  Pulse 92  Temp(Src) 98 F (36.7 C)  SpO2 97%  BP 109/71 P 97 standing.

## 2015-04-16 ENCOUNTER — Ambulatory Visit
Admit: 2015-04-16 | Discharge: 2015-04-16 | Disposition: A | Discharge status: Home / Self Care | Payer: BLUE CROSS/BLUE SHIELD | Attending: Radiation Oncology | Admitting: Radiation Oncology

## 2015-04-16 DIAGNOSIS — Z510 Encounter for antineoplastic radiation therapy: Secondary | ICD-10-CM | POA: Diagnosis not present

## 2015-04-17 ENCOUNTER — Ambulatory Visit
Admit: 2015-04-17 | Discharge: 2015-04-17 | Disposition: A | Discharge status: Home / Self Care | Payer: BLUE CROSS/BLUE SHIELD | Attending: Radiation Oncology | Admitting: Radiation Oncology

## 2015-04-17 DIAGNOSIS — Z510 Encounter for antineoplastic radiation therapy: Secondary | ICD-10-CM | POA: Diagnosis not present

## 2015-04-18 ENCOUNTER — Ambulatory Visit
Admit: 2015-04-18 | Discharge: 2015-04-18 | Disposition: A | Discharge status: Home / Self Care | Payer: BLUE CROSS/BLUE SHIELD | Attending: Radiation Oncology | Admitting: Radiation Oncology

## 2015-04-18 DIAGNOSIS — Z510 Encounter for antineoplastic radiation therapy: Secondary | ICD-10-CM | POA: Diagnosis not present

## 2015-04-19 ENCOUNTER — Ambulatory Visit
Admission: RE | Admit: 2015-04-19 | Discharge: 2015-04-19 | Disposition: A | Discharge status: Home / Self Care | Payer: BLUE CROSS/BLUE SHIELD | Source: Ambulatory Visit | Attending: Radiation Oncology | Admitting: Radiation Oncology

## 2015-04-19 ENCOUNTER — Encounter: Payer: Self-pay | Admitting: Family Medicine

## 2015-04-19 ENCOUNTER — Ambulatory Visit (INDEPENDENT_AMBULATORY_CARE_PROVIDER_SITE_OTHER): Payer: BLUE CROSS/BLUE SHIELD | Admitting: Family Medicine

## 2015-04-19 VITALS — BP 104/80 | HR 101 | Temp 98.4°F | Ht 64.0 in | Wt 168.4 lb

## 2015-04-19 VITALS — BP 118/72 | HR 97 | Temp 98.6°F | Wt 165.5 lb

## 2015-04-19 DIAGNOSIS — R739 Hyperglycemia, unspecified: Secondary | ICD-10-CM | POA: Diagnosis not present

## 2015-04-19 DIAGNOSIS — C50919 Malignant neoplasm of unspecified site of unspecified female breast: Secondary | ICD-10-CM

## 2015-04-19 DIAGNOSIS — C7931 Secondary malignant neoplasm of brain: Secondary | ICD-10-CM

## 2015-04-19 DIAGNOSIS — Z510 Encounter for antineoplastic radiation therapy: Secondary | ICD-10-CM | POA: Diagnosis not present

## 2015-04-19 NOTE — Progress Notes (Signed)
HPI:  Hospital stay 8/30-04/06/2015 for AMS, NV, gait abnormality and found to have mets to brain. On decadron and here for follow up of blood sugars. She reports feeling much better on the decadron, though the blood sugars have been high in the mid 100s-lower 200s fasting on metformin 1000mg  bid and 8 units of insulin. No low blood sugars. She reports she feels suprisingly well with a good mood, good appetite and improved memory and gait. She reports strong [place] faith and is ok with whatever God has planned for her. She has follow up with her cancer doctors lined up.  Aphasia/encephalopathy/headaches, blurry vison: Secondary to brain metastases: improved. Managed by onc.  Stage IV left breast carcinoma now with new Brain metastases:  Oncology seeing. Discharge on oral decadron 4 mg TID   Diabetes mellitus type 2: Mild. Given continuous steroids, SSI in hospital. Discharged on low dose lantus. Increased metformin and lantis per phone calls last week. Reports:  Hypothyroid: Continue Synthroid   ROS: See pertinent positives and negatives per HPI.  Past Medical History  Diagnosis Date  . ALLERGIC RHINITIS   . GERD (gastroesophageal reflux disease)   . Hypertension   . Hypothyroidism   . Hx of colonic polyps   . Wears glasses   . Radiation 09/15/13-11/01/13    Left chest wall/PAB/scar/supraclavicular fossa  . SYNCOPE 05/15/2010    Qualifier: Diagnosis of  By: Arnoldo Morale MD, [place], WITH RADICULOPATHY 10/01/2007    Qualifier: Diagnosis of  By: Arnoldo Morale MD, [place] 05/31/2007    Qualifier: Diagnosis of  By: Arnoldo Morale MD, [place], HX OF 05/31/2007    Qualifier: Diagnosis of  By: Arnoldo Morale MD, [place]   . Lymph edema L arm from breast ca tx 12/22/2013  . H/O blood clots     hx of small blood clots in both legs  . Diabetes mellitus without complication     type 2 - on Metformin  . Anxiety   . Neuropathy due to chemotherapeutic drug   . Breast  cancer 59, age 47    right  . Breast cancer 09/30/12    left, ER/PR -, Her 2 -  . Secondary lung cancer 2016  . Anemia     during chemo  . History of IBS   . Glaucoma   . Dry skin   . Lymphedema of arm     left arm, post mastectomy  . Shortness of breath dyspnea     with exertion (since onset of pleural effusion) Wears O2 2 L as needed  . Chronic respiratory failure; On Home oxygen 2L since 12/03/2014 02/11/2015    Past Surgical History  Procedure Laterality Date  . Tonsillectomy    . Caesarean section      x 1  . Mastectomy  04/1987    right side  . Abdominal hysterectomy  06/1988    fibroids  . Portacath placement Right 10/28/2012    Procedure: PORT PLACEMENT;  Surgeon: Joyice Faster. Cornett, MD;  Location: [facility];  Service: General;  Laterality: Right;  Right Subclavian Vein  . Mastectomy modified radical Left 07/19/2013    Procedure: MASTECTOMY MODIFIED RADICAL;  Surgeon: Marcello Moores A. Cornett, MD;  Location: [facility];  Service: General;  Laterality: Left;  . Port-a-cath removal Right 07/19/2013    Procedure: REMOVAL PORT-A-CATH;  Surgeon: Joyice Faster. Cornett, MD;  Location: [facility];  Service: General;  Laterality: Right;  . Cesarean section    . Breast surgery Left 07/19/13    MRM  . Breast surgery Right     mastectomy  . Port-a-cath insertion  12/03/2014  . Colonoscopy    . Chest tube insertion Right 01/02/2015    Procedure: INSERTION PLEURAL DRAINAGE CATHETER RIGHT CHEST;  Surgeon: Gaye Pollack, MD;  Location: [place] [facility];  Service: Thoracic;  Laterality: Right;  . Removal of pleural drainage catheter Right 01/16/2015    Procedure: REMOVAL OF PLEURAL DRAINAGE CATHETER;  Surgeon: Gaye Pollack, MD;  Location: [facility];  Service: Thoracic;  Laterality: Right;  . Talc pleurodesis Right 01/16/2015    Procedure: Pietro Cassis;  Surgeon: Gaye Pollack, MD;  Location: [facility];  Service: Thoracic;  Laterality: Right;    Family History   Problem Relation Age of Onset  . Colon cancer Mother 80    family hx of colon ca 1st degree relative <60  . Hypertension Mother   . Coronary artery disease Father     family hx of CAD female 1st degree relative <50  . Stomach cancer Neg Hx   . Rectal cancer Neg Hx   . Esophageal cancer Neg Hx   . Breast cancer Maternal Grandmother 80  . Diabetes Paternal Grandmother   . Breast cancer Cousin 79    maternal cousin  . Coronary artery disease Maternal Grandfather   . Breast cancer Sister     paternal half sister; died in her 60s    Social History   Social History  . Marital Status: Married    Spouse Name: Donnie  . Number of Children: 1  . Years of Education: N/A   Occupational History  . Unemployed    Social History Main Topics  . Smoking status: Never Smoker   . Smokeless tobacco: Never Used  . Alcohol Use: No  . Drug Use: No  . Sexual Activity: Not Currently     Comment: menarche 59, 1st pregnancy age 33-miscarr, age 59 1st live birth, menop 72   Other Topics Concern  . None   Social History Narrative   Married. Lives w/ husband.  Has not worked since being diagnosed with cancer 2 years ago.  Previously worked as a Solicitor.  Independent with ADLs.      Spiritual Beliefs:Christian      Lifestyle: started the [employer] exercise program for cancer survivors (12/2013); working on diet              Current outpatient prescriptions:  .  cetirizine (ZYRTEC) 10 MG tablet, Take 10 mg by mouth daily as needed for allergies. , Disp: , Rfl:  .  cholecalciferol (VITAMIN D) 1000 UNITS tablet, Take 1,000 Units by mouth every morning. , Disp: , Rfl:  .  dexamethasone (DECADRON) 4 MG tablet, Take 1 tablet (4 mg total) by mouth 3 (three) times daily., Disp: 90 tablet, Rfl: 0 .  diltiazem (CARDIZEM CD) 120 MG 24 hr capsule, Take 1 capsule (120 mg total) by mouth every morning., Disp: 90 capsule, Rfl: 1 .  esomeprazole (NEXIUM) 20 MG capsule, Take 20 mg by mouth daily as  needed (acid). , Disp: , Rfl:  .  fluticasone (FLONASE) 50 MCG/ACT nasal spray, Place 2 sprays into both nostrils daily as needed for allergies or rhinitis., Disp: , Rfl:  .  insulin glargine (LANTUS) 100 UNIT/ML injection, Inject 0.08 mLs (8 Units total) into the skin at bedtime., Disp: 10 mL, Rfl: 11 .  KLOR-CON  M20 20 MEQ tablet, , Disp: , Rfl:  .  latanoprost (XALATAN) 0.005 % ophthalmic solution, Place 1 drop into both eyes at bedtime. , Disp: , Rfl:  .  levothyroxine (SYNTHROID, LEVOTHROID) 50 MCG tablet, Take 1 tablet (50 mcg total) by mouth daily., Disp: 90 tablet, Rfl: 3 .  lidocaine-prilocaine (EMLA) cream, Apply to affected area once (Patient taking differently: Apply 1 application topically as needed (for chemo). Apply to affected area once), Disp: 30 g, Rfl: 3 .  LORazepam (ATIVAN) 0.5 MG tablet, Take 1 tablet (0.5 mg total) by mouth every 6 (six) hours as needed (Nausea or vomiting)., Disp: 30 tablet, Rfl: 0 .  metFORMIN (GLUCOPHAGE) 500 MG tablet, Take 1000mg  (2 tabs) in the morning and 500mg  (1 tab) in the evening with meals, Disp: 270 tablet, Rfl: 3 .  ondansetron (ZOFRAN) 8 MG tablet, Take 1 tablet (8 mg total) by mouth 2 (two) times daily. Start the day after chemo for 2 days. Then take as needed for nausea or vomiting., Disp: 30 tablet, Rfl: 1 .  oxyCODONE (OXY IR/ROXICODONE) 5 MG immediate release tablet, Take 1 tablet (5 mg total) by mouth every 4 (four) hours as needed for moderate pain., Disp: 30 tablet, Rfl: 0 .  prochlorperazine (COMPAZINE) 10 MG tablet, Take 1 tablet (10 mg total) by mouth every 6 (six) hours as needed (Nausea or vomiting)., Disp: 30 tablet, Rfl: 1 .  Tetrahydrozoline HCl (VISINE OP), Apply 1-2 drops to eye daily as needed (itchy eyes.)., Disp: , Rfl:  .  timolol (TIMOPTIC) 0.5 % ophthalmic solution, Place 1 drop into both eyes 2 (two) times daily., Disp: , Rfl:   EXAM:  Filed Vitals:   04/19/15 1507  BP: 104/80  Pulse: 101  Temp: 98.4 F (36.9 C)     Body mass index is 28.89 kg/(m^2).  GENERAL: vitals reviewed and listed above, alert, oriented, appears well hydrated and in no acute distress  HEENT: atraumatic, conjunttiva clear, no obvious abnormalities on inspection of external nose and ears  NECK: no obvious masses on inspection  MS: moves all extremities without noticeable abnormality  PSYCH: pleasant and cooperative, no obvious depression or anxiety  ASSESSMENT AND PLAN:  Discussed the following assessment and plan:  Aphasia/encephalopathy/headaches, blurry vison: Secondary to brain metastases: improved. Managed by onc.  Stage IV left breast carcinoma now with new Brain metastases:  Oncology seeing. Discharge on oral decadron 4 mg TID   Diabetes mellitus type 2: increase lantus by 2 units, call in 3 days if fasting BS remain high.  -Patient advised to return or notify a doctor immediately if symptoms worsen or persist or new concerns arise.  There are no Patient Instructions on file for this visit.   Colin Benton R.

## 2015-04-19 NOTE — Progress Notes (Signed)
Pre visit review using our clinic review tool, if applicable. No additional management support is needed unless otherwise documented below in the visit note. 

## 2015-04-19 NOTE — Progress Notes (Signed)
Weekly assessment of radiation to brain.Completed 10 of 14 treatments.Denies pain.Occasional upset stomach attributed to metformin.Continued fatigue.blood sugar has been better.dizziness improved. BP 118/72 mmHg  Pulse 97  Temp(Src) 98.6 F (37 C)  Wt 165 lb 8 oz (75.07 kg)  SpO2 100%

## 2015-04-19 NOTE — Progress Notes (Signed)
Weekly Management Note Current Dose: 25  Gy  Projected Dose: 35 Gy   Narrative:  The patient presents for routine under treatment assessment.  CBCT/MVCT images/Port film x-rays were reviewed.  The chart was checked. Blood sugars better with decreasing decadron dose. No headaches. Balance stable.   Physical Findings: Weight: 165 lb 8 oz (75.07 kg). Unchanged. No thrush.   Impression:  The patient is tolerating radiation.  Plan:  Continue treatment as planned. Will continue decadron taper next week.

## 2015-04-20 ENCOUNTER — Ambulatory Visit
Admit: 2015-04-20 | Discharge: 2015-04-20 | Disposition: A | Discharge status: Home / Self Care | Payer: BLUE CROSS/BLUE SHIELD | Attending: Radiation Oncology | Admitting: Radiation Oncology

## 2015-04-20 DIAGNOSIS — Z510 Encounter for antineoplastic radiation therapy: Secondary | ICD-10-CM | POA: Diagnosis not present

## 2015-04-23 ENCOUNTER — Ambulatory Visit
Admit: 2015-04-23 | Discharge: 2015-04-23 | Disposition: A | Discharge status: Home / Self Care | Payer: BLUE CROSS/BLUE SHIELD | Attending: Radiation Oncology | Admitting: Radiation Oncology

## 2015-04-23 DIAGNOSIS — Z510 Encounter for antineoplastic radiation therapy: Secondary | ICD-10-CM | POA: Diagnosis not present

## 2015-04-24 ENCOUNTER — Ambulatory Visit (HOSPITAL_COMMUNITY)
Admission: RE | Admit: 2015-04-24 | Discharge: 2015-04-24 | Disposition: A | Discharge status: Home / Self Care | Payer: BLUE CROSS/BLUE SHIELD | Source: Ambulatory Visit | Attending: Nurse Practitioner | Admitting: Nurse Practitioner

## 2015-04-24 ENCOUNTER — Ambulatory Visit
Admission: RE | Admit: 2015-04-24 | Discharge: 2015-04-24 | Disposition: A | Discharge status: Home / Self Care | Payer: BLUE CROSS/BLUE SHIELD | Source: Ambulatory Visit | Attending: Radiation Oncology | Admitting: Radiation Oncology

## 2015-04-24 ENCOUNTER — Encounter: Payer: Self-pay | Admitting: Radiation Oncology

## 2015-04-24 ENCOUNTER — Ambulatory Visit
Admit: 2015-04-24 | Discharge: 2015-04-24 | Disposition: A | Discharge status: Home / Self Care | Payer: BLUE CROSS/BLUE SHIELD | Attending: Radiation Oncology | Admitting: Radiation Oncology

## 2015-04-24 VITALS — BP 128/67 | HR 84 | Resp 16 | Wt 165.1 lb

## 2015-04-24 DIAGNOSIS — J910 Malignant pleural effusion: Secondary | ICD-10-CM | POA: Diagnosis not present

## 2015-04-24 DIAGNOSIS — C7931 Secondary malignant neoplasm of brain: Secondary | ICD-10-CM

## 2015-04-24 DIAGNOSIS — K8020 Calculus of gallbladder without cholecystitis without obstruction: Secondary | ICD-10-CM | POA: Insufficient documentation

## 2015-04-24 DIAGNOSIS — K5730 Diverticulosis of large intestine without perforation or abscess without bleeding: Secondary | ICD-10-CM | POA: Diagnosis not present

## 2015-04-24 DIAGNOSIS — R918 Other nonspecific abnormal finding of lung field: Secondary | ICD-10-CM | POA: Diagnosis not present

## 2015-04-24 DIAGNOSIS — C50412 Malignant neoplasm of upper-outer quadrant of left female breast: Secondary | ICD-10-CM | POA: Insufficient documentation

## 2015-04-24 DIAGNOSIS — Z510 Encounter for antineoplastic radiation therapy: Secondary | ICD-10-CM | POA: Diagnosis not present

## 2015-04-24 LAB — GLUCOSE, CAPILLARY: Glucose-Capillary: 176 mg/dL — ABNORMAL HIGH (ref 65–99)

## 2015-04-24 MED ORDER — FLUDEOXYGLUCOSE F - 18 (FDG) INJECTION
8.2800 | Freq: Once | INTRAVENOUS | Status: DC | PRN
  Administered 2015-04-24: 8.28 via INTRAVENOUS
  Filled 2015-04-24: qty 8.28

## 2015-04-24 MED ORDER — NYSTATIN 100000 UNIT/ML MT SUSP: 5.0000 mL | Freq: Four times a day (QID) | OROMUCOSAL | Status: DC

## 2015-04-24 NOTE — Patient Instructions (Signed)
Decadron 4 mg daily x 7 days starting today (04/24/15) then stop.

## 2015-04-24 NOTE — Progress Notes (Addendum)
Weight and vitals stable. Denies pain. Scheduled for PET today; ordered by Dr. Lindi Adie. Reports taking Decadron 4 mg bid. Denies headache or ringing in the ears. Denies nausea or vomiting. Walks with the aid of a cane. Reports dizziness. Reports blurry vision. Reports she fatigues easily so she paces herself with activities. Reports she did manage to get out to the store this weekend which made her very happy. Ulcerations noted right buccal mucosa; questioning if thrush is present. Reports using Biotene regularly. Patient wearing a wig. Patient denies skin changes to scalp. She reports using Biafine bid on scalp as directed. Encouraged patient to continue Biafine bid for the next two weeks. Provided patient with a one month follow up appointment card. Encouraged patient to contact staff with future needs. Patient verbalized understanding.   BP 128/67 mmHg  Pulse 84  Resp 16  Wt 165 lb 1.6 oz (74.889 kg)  SpO2 100% Wt Readings from Last 3 Encounters:  04/24/15 165 lb 1.6 oz (74.889 kg)  04/19/15 168 lb 6.4 oz (76.386 kg)  04/19/15 165 lb 8 oz (75.07 kg)

## 2015-04-24 NOTE — Progress Notes (Signed)
Weekly Management Note Current Dose:32.5  Gy  Projected Dose: 35 Gy   Narrative:  The patient presents for routine under treatment assessment.  CBCT/MVCT images/Port film x-rays were reviewed.  The chart was checked. Blood sugars better with decreasing decadron dose. No headaches. Balance stable. Dizziness continues.   Physical Findings: Weight: 165 lb 1.6 oz (74.889 kg). Unchanged. Small areas of thrush. .   Impression:  The patient is tolerating radiation.  Plan:  Continue treatment as planned. Will continue decadron starting today. INstructions given. Follow up in 1 month. Pt will monitor blood sugars. Nystatin for thrush.

## 2015-04-25 ENCOUNTER — Encounter: Payer: Self-pay | Admitting: Hematology and Oncology

## 2015-04-25 ENCOUNTER — Ambulatory Visit (HOSPITAL_BASED_OUTPATIENT_CLINIC_OR_DEPARTMENT_OTHER): Payer: BLUE CROSS/BLUE SHIELD | Admitting: Hematology and Oncology

## 2015-04-25 ENCOUNTER — Ambulatory Visit
Admit: 2015-04-25 | Discharge: 2015-04-25 | Disposition: A | Discharge status: Home / Self Care | Payer: BLUE CROSS/BLUE SHIELD | Attending: Radiation Oncology | Admitting: Radiation Oncology

## 2015-04-25 ENCOUNTER — Telehealth: Payer: Self-pay | Admitting: Hematology and Oncology

## 2015-04-25 VITALS — BP 141/75 | HR 96 | Temp 98.0°F | Resp 18 | Ht 64.0 in | Wt 170.9 lb

## 2015-04-25 DIAGNOSIS — J90 Pleural effusion, not elsewhere classified: Secondary | ICD-10-CM

## 2015-04-25 DIAGNOSIS — C50412 Malignant neoplasm of upper-outer quadrant of left female breast: Secondary | ICD-10-CM

## 2015-04-25 DIAGNOSIS — F341 Dysthymic disorder: Secondary | ICD-10-CM | POA: Diagnosis not present

## 2015-04-25 DIAGNOSIS — C7800 Secondary malignant neoplasm of unspecified lung: Secondary | ICD-10-CM | POA: Diagnosis not present

## 2015-04-25 DIAGNOSIS — Z510 Encounter for antineoplastic radiation therapy: Secondary | ICD-10-CM | POA: Diagnosis not present

## 2015-04-25 NOTE — Telephone Encounter (Signed)
Appointments made and avs printed for patient °

## 2015-04-25 NOTE — Progress Notes (Signed)
Patient Care Team: Lucretia Kern, DO as PCP - General (Family Medicine)  DIAGNOSIS: Breast cancer of upper-outer quadrant of left female breast   Staging form: Breast, AJCC 7th Edition     Clinical: Stage IIIB (T4, N1, cM0) - Signed by Thea Silversmith, MD on 10/29/2012       Prognostic indicators: ER-, PR- HER2 -      Pathologic: No stage assigned - Unsigned       Prognostic indicators: ER-, PR- HER2 -    SUMMARY OF ONCOLOGIC HISTORY:   Breast cancer of upper-outer quadrant of left female breast   10/06/1986 Initial Diagnosis Right breast mastectomy, breast cancer diagnosed during pregnancy, no adjuvant treatment given   09/30/2012 Initial Biopsy Left breast biopsy done for left nipple retraction: Invasive ductal carcinoma grade 3, ER/PR negative, HER-2 negative, Ki-67 21% with grade 3 DCIS   10/12/2012 Breast MRI Left breast diffuse enhancement measuring 13 x 12.5 x 9.5 cm with diffuse skin thickening and dermal enhancement, multiple enlarged level I left axillary lymph nodes largest 2.4 cm   11/02/2012 - 06/06/2013 Neo-Adjuvant Chemotherapy Neoadjuvant FEC 4 followed by Taxol carbo 8 discontinued for neuropathy, Gemzar Carbo 2   09/15/2013 - 11/01/2013 Radiation Therapy Xeloda with adjuvant radiation   10/30/2014 Imaging Right hilar node 1.6 cm, subcarinal 1.2 cm, 1 cm additional right hilar, right lower lobe 3.2 cm, RUL 1 cm, and LDL of 1.3 cm, moderate pleural effusion, right liver 0.8 cm, 15 cm fluid collection left chest wall   10/31/2014 Initial Biopsy Lung biopsy right lower lobe: Carcinoma with Extensive necrosis, microscopic foci of nonnecrotic non-small cell, poorly differentiated carcinoma, metastatic disease likely ER 0%, PR 0%, HER-2 negative   12/01/2014 Procedure Second thoracentesis for malignant pleural effusion   12/06/2014 -  Chemotherapy Halaven day 1 day 8 every 3 weeks   02/11/2015 - 02/13/2015 Hospital Admission Hospitalization for gastroenteritis, accompanied by nausea vomiting  dehydration and hypokalemia   04/06/2015 Relapse/Recurrence Brain MRI showing diffuse bilateral brain metastasis   04/06/2015 -  Radiation Therapy Palliative radiation therapy to the brain 15 fractions   04/24/2015 PET scan Previously described extensive pulmonary and right pleural nodularity has improved largest lung nodule right lower lobe 2.4 cm, right middle lobe 12 mm, no activity left lung, right pleural effusion resolved. No bone or liver metastases    CHIEF COMPLIANT: Follow-up after CT scan, completed whole brain radiation today.  INTERVAL HISTORY: Christine [name] is a 59 year old with above-mentioned history metastatic breast cancer currently finished with palliative radiation therapy to the brain for multiple brain metastases. She is here today to discuss the results of the PET CT scan. She is able to function with the help of a cane. She does have occasional dizziness and lightheadedness. She was on Spiriva for the brain metastases and the dosages to [place] is being tapered. Her blood sugars have gone up because his carotids and required insulin therapy. Her primary care physician is very closely monitoring her. She had previously lung metastases and malignant pleural effusion. She underwent pleurodesis. Her breathing has improved markedly.  REVIEW OF SYSTEMS:   Constitutional: Denies fevers, chills or abnormal weight loss Eyes: Denies blurriness of vision Ears, nose, mouth, throat, and face: Denies mucositis or sore throat Respiratory: Markedly improved breathing Cardiovascular: Denies palpitation, chest discomfort or lower extremity swelling Gastrointestinal:  Denies nausea, heartburn or change in bowel habits Skin: Denies abnormal skin rashes Lymphatics: Denies new lymphadenopathy or easy bruising Neurological: Dizziness Behavioral/Psych: Mood is stable, no new  changes   All other systems were reviewed with the patient and are negative.  I have reviewed the past medical history, past  surgical history, social history and family history with the patient and they are unchanged from previous note.  ALLERGIES:  is allergic to sulfa antibiotics; sulfonamide derivatives; and tomato.  MEDICATIONS:  Current Outpatient Prescriptions  Medication Sig Dispense Refill  . cetirizine (ZYRTEC) 10 MG tablet Take 10 mg by mouth daily as needed for allergies.     . cholecalciferol (VITAMIN D) 1000 UNITS tablet Take 1,000 Units by mouth every morning.     Marland Kitchen dexamethasone (DECADRON) 4 MG tablet Take 1 tablet (4 mg total) by mouth 3 (three) times daily. 90 tablet 0  . diltiazem (CARDIZEM CD) 120 MG 24 hr capsule Take 1 capsule (120 mg total) by mouth every morning. 90 capsule 1  . esomeprazole (NEXIUM) 20 MG capsule Take 20 mg by mouth daily as needed (acid).     . fluticasone (FLONASE) 50 MCG/ACT nasal spray Place 2 sprays into both nostrils daily as needed for allergies or rhinitis.    Marland Kitchen insulin glargine (LANTUS) 100 UNIT/ML injection Inject 0.08 mLs (8 Units total) into the skin at bedtime. 10 mL 11  . KLOR-CON M20 20 MEQ tablet     . latanoprost (XALATAN) 0.005 % ophthalmic solution Place 1 drop into both eyes at bedtime.     Marland Kitchen levothyroxine (SYNTHROID, LEVOTHROID) 50 MCG tablet Take 1 tablet (50 mcg total) by mouth daily. 90 tablet 3  . lidocaine-prilocaine (EMLA) cream Apply to affected area once (Patient taking differently: Apply 1 application topically as needed (for chemo). Apply to affected area once) 30 g 3  . LORazepam (ATIVAN) 0.5 MG tablet Take 1 tablet (0.5 mg total) by mouth every 6 (six) hours as needed (Nausea or vomiting). 30 tablet 0  . metFORMIN (GLUCOPHAGE) 500 MG tablet Take $RemoveBef'1000mg'IzOAFjfjhF$  (2 tabs) in the morning and $RemoveBef'500mg'OowUltbzdz$  (1 tab) in the evening with meals 270 tablet 3  . nystatin (MYCOSTATIN) 100000 UNIT/ML suspension Take 5 mLs (500,000 Units total) by mouth 4 (four) times daily. 60 mL 0  . ondansetron (ZOFRAN) 8 MG tablet Take 1 tablet (8 mg total) by mouth 2 (two) times daily.  Start the day after chemo for 2 days. Then take as needed for nausea or vomiting. 30 tablet 1  . oxyCODONE (OXY IR/ROXICODONE) 5 MG immediate release tablet Take 1 tablet (5 mg total) by mouth every 4 (four) hours as needed for moderate pain. 30 tablet 0  . prochlorperazine (COMPAZINE) 10 MG tablet Take 1 tablet (10 mg total) by mouth every 6 (six) hours as needed (Nausea or vomiting). 30 tablet 1  . Tetrahydrozoline HCl (VISINE OP) Apply 1-2 drops to eye daily as needed (itchy eyes.).    Marland Kitchen timolol (TIMOPTIC) 0.5 % ophthalmic solution Place 1 drop into both eyes 2 (two) times daily.     No current facility-administered medications for this visit.   Facility-Administered Medications Ordered in Other Visits  Medication Dose Route Frequency Provider Last Rate Last Dose  . fludeoxyglucose F - 18 (FDG) injection 8.28 milli Curie  8.28 milli Curie Intravenous Once PRN Medication Radiologist, MD   8.28 milli Curie at 04/24/15 1128    PHYSICAL EXAMINATION: ECOG PERFORMANCE STATUS: 1 - Symptomatic but completely ambulatory  Filed Vitals:   04/25/15 0950  BP: 141/75  Pulse: 96  Temp: 98 F (36.7 C)  Resp: 18   Filed Weights   04/25/15 0950  Weight:  170 lb 14.4 oz (77.52 kg)    GENERAL:alert, no distress and comfortable SKIN: skin color, texture, turgor are normal, no rashes or significant lesions EYES: normal, Conjunctiva are pink and non-injected, sclera clear OROPHARYNX:no exudate, no erythema and lips, buccal mucosa, and tongue normal  NECK: supple, thyroid normal size, non-tender, without nodularity LYMPH:  no palpable lymphadenopathy in the cervical, axillary or inguinal LUNGS: clear to auscultation and percussion with normal breathing effort HEART: regular rate & rhythm and no murmurs and no lower extremity edema ABDOMEN:abdomen soft, non-tender and normal bowel sounds Musculoskeletal:no cyanosis of digits and no clubbing  NEURO: alert & oriented x 3 with fluent speech, no focal  motor/sensory deficits  LABORATORY DATA:  I have reviewed the data as listed   Chemistry      Component Value Date/Time   NA 136 04/11/2015 1000   NA 142 04/05/2015 0515   K 4.9 04/11/2015 1000   K 3.7 04/05/2015 0515   CL 104 04/05/2015 0515   CL 101 01/25/2013 0952   CO2 21* 04/11/2015 1000   CO2 30 04/05/2015 0515   BUN 34.4* 04/11/2015 1000   BUN 16 04/05/2015 0515   CREATININE 0.9 04/11/2015 1000   CREATININE 0.73 04/05/2015 0515      Component Value Date/Time   CALCIUM 9.4 04/11/2015 1000   CALCIUM 8.9 04/05/2015 0515   ALKPHOS 125 04/11/2015 1000   ALKPHOS 78 02/12/2015 0531   AST 14 04/11/2015 1000   AST 17 02/12/2015 0531   ALT 21 04/11/2015 1000   ALT 11* 02/12/2015 0531   BILITOT 0.74 04/11/2015 1000   BILITOT 1.1 02/12/2015 0531       Lab Results  Component Value Date   WBC 35.1* 04/11/2015   HGB 13.3 04/11/2015   HCT 41.8 04/11/2015   MCV 97.6 04/11/2015   PLT 107* 04/11/2015   NEUTROABS 33.6* 04/11/2015     RADIOGRAPHIC STUDIES: I have personally reviewed the radiology reports and agreed with their findings. Nm Pet Image Restag (ps) Skull Base To Thigh  04/24/2015   CLINICAL DATA:  Subsequent treatment strategy for left breast cancer.  EXAM: NUCLEAR MEDICINE PET SKULL BASE TO THIGH  TECHNIQUE: 8.28 mCi F-18 FDG was injected intravenously. Full-ring PET imaging was performed from the skull base to thigh after the radiotracer. CT data was obtained and used for attenuation correction and anatomic localization.  FASTING BLOOD GLUCOSE:  Value: 176 mg/dl  COMPARISON:  PET-CT 11/27/2014.  FINDINGS: NECK  No hypermetabolic cervical lymph nodes are identified.There are no lesions of the pharyngeal mucosal space.  CHEST  No residual hypermetabolic mediastinal, hilar, axillary or internal mammary lymph nodes are identified. The previously demonstrated extensive hypermetabolic pulmonary and right pleural nodularity has improved. The largest remaining pulmonary  nodule is in the right lower lobe, measuring 2.4 cm on image 43 and having an SUV max of 4.6. This nodule is largely obscured by right lower lobe collapse on the prior CT. There is a 12 mm right middle lobe nodule on image 31 which has an SUV max of 3.9. There is no significant residual hypermetabolic activity within the left lung. Minimal left apical pulmonary opacity is probably treatment related. The right pleural effusion has nearly completely resolved. There is some high density in the right pleural space suggesting interval pleurodesis. The greatest pleural activity now has an SUV max of 4.1.  ABDOMEN/PELVIS  There is no hypermetabolic activity within the liver, adrenal glands, spleen or pancreas. There is no hypermetabolic nodal activity. Calcified  gallstone and mild colonic diverticulosis again noted. The uterus is surgically absent. There is no adnexal mass.  SKELETON  There is no hypermetabolic activity to suggest osseous metastatic disease. There are postsurgical changes within both axilla and left chest wall. No chest wall recurrence identified.  IMPRESSION: 1. Significant response to interval therapy. The bilateral pulmonary nodules have all decreased in size and metabolic activity. 2. Near-complete resolution of large malignant right pleural effusion following presumed pleurodesis. There is mild residual hypermetabolic activity within the right pleural space. 3. No evidence of chest wall recurrence or extra thoracic metastatic disease. 4. Cholelithiasis.   Electronically Signed   By: Richardean Sale M.D.   On: 04/24/2015 15:04     ASSESSMENT & PLAN:  Breast cancer of upper-outer quadrant of left female breast 09/30/2012 :Stage III, triple negative, invasive ductal carcinoma of the left breast. Neoadjuvant chemotherapy following the surgery followed by adjuvant radiation with concurrent Xeloda. 3.9 cm size and 9/13 lymph nodes were positive with extracapsular extension. ER/PR negative HER-2  negative Ki-67 21% March 2016: CT chest revealed bilateral lung nodules biopsy-proven to be triple negative metastatic carcinoma , pleural effusion status post thoracentesis malignant cells on cytology, repeat thoracentesis 12/01/2014 and 12/12/2014 PET CT scan 11/27/2014 showed widespread metastatic disease to the thorax with multiple pulmonary nodules and masses and malignant right pleural effusion and right hilar and mediastinal malignant lymph nodes. Brain MRI 04/03/2015: Widespread intracranial metastases involving both cerebral hemispheres and cerebellum status post whole brain radiation -------------------------------------------------------------------------------------------------------------------------- PET/CT 04/24/2015 Previously described extensive pulmonary and right pleural nodularity has improved largest lung nodule right lower lobe 2.4 cm, right middle lobe 12 mm, no activity left lung, right pleural effusion resolved. No bone or liver metastases.  Radiology review: I discussed with the patient extensively the details the PET/CT scanner and provided her with a copy of this result. It appears that chemotherapy has controlled her disease systemically but not in the brain. Since she completed whole brain radiation therapy, I recommended continuing palliative chemotherapy with Halaven.  Current Treatment: Completed Cycle 6 Halaven (days 1,8 q 3 weeks) started 12/06/14; we will resume Halaven 05/02/2015 based upon good results from the PET/CT scan  Diabetes mellitus: Currently on insulin as well as oral therapy. Dexamethasone is being tapered by radiation oncology. Her sugars are starting to come down.  Thrush steroid-induced: On nystatin Return to clinic in 1 week for starting systemic chemotherapy  No orders of the defined types were placed in this encounter.   The patient has a good understanding of the overall plan. she agrees with it. she will call with any problems that may  develop before the next visit here.   Rulon Eisenmenger, MD

## 2015-04-25 NOTE — Assessment & Plan Note (Signed)
09/30/2012 :Stage III, triple negative, invasive ductal carcinoma of the left breast. Neoadjuvant chemotherapy following the surgery followed by adjuvant radiation with concurrent Xeloda. 3.9 cm size and 9/13 lymph nodes were positive with extracapsular extension. ER/PR negative HER-2 negative Ki-67 21% March 2016: CT chest revealed bilateral lung nodules biopsy-proven to be triple negative metastatic carcinoma , pleural effusion status post thoracentesis malignant cells on cytology, repeat thoracentesis 12/01/2014 and 12/12/2014 PET CT scan 11/27/2014 showed widespread metastatic disease to the thorax with multiple pulmonary nodules and masses and malignant right pleural effusion and right hilar and mediastinal malignant lymph nodes. Brain MRI 04/03/2015: Widespread intracranial metastases involving both cerebral hemispheres and cerebellum status post whole brain radiation -------------------------------------------------------------------------------------------------------------------------------------------------------- PET/CT 04/24/2015 Previously described extensive pulmonary and right pleural nodularity has improved largest lung nodule right lower lobe 2.4 cm, right middle lobe 12 mm, no activity left lung, right pleural effusion resolved. No bone or liver metastases.  Radiology review: I discussed with the patient extensively the details the PET/CT scanner and provided her with a copy of this result. It appears that chemotherapy has controlled her disease systemically but not in the brain. Since she completed whole brain radiation therapy, I recommended continuing palliative chemotherapy with Halaven.  Current Treatment: Cycle 6 day 1 Halaven (days 1,8 q 3 weeks) started 12/06/14

## 2015-04-28 ENCOUNTER — Other Ambulatory Visit: Payer: Self-pay | Admitting: Hematology and Oncology

## 2015-04-28 ENCOUNTER — Other Ambulatory Visit: Payer: Self-pay | Admitting: Radiation Oncology

## 2015-04-30 ENCOUNTER — Other Ambulatory Visit: Payer: Self-pay | Admitting: *Deleted

## 2015-04-30 DIAGNOSIS — C50911 Malignant neoplasm of unspecified site of right female breast: Secondary | ICD-10-CM

## 2015-04-30 DIAGNOSIS — C50412 Malignant neoplasm of upper-outer quadrant of left female breast: Secondary | ICD-10-CM

## 2015-04-30 DIAGNOSIS — C50912 Malignant neoplasm of unspecified site of left female breast: Principal | ICD-10-CM

## 2015-04-30 MED ORDER — ONDANSETRON HCL 8 MG PO TABS: 8.0000 mg | ORAL_TABLET | Freq: Three times a day (TID) | ORAL | Status: AC | PRN

## 2015-04-30 MED ORDER — KLOR-CON M20 20 MEQ PO TBCR: 20.0000 meq | EXTENDED_RELEASE_TABLET | Freq: Two times a day (BID) | ORAL | Status: DC

## 2015-05-02 ENCOUNTER — Ambulatory Visit (HOSPITAL_BASED_OUTPATIENT_CLINIC_OR_DEPARTMENT_OTHER): Payer: BLUE CROSS/BLUE SHIELD

## 2015-05-02 ENCOUNTER — Telehealth: Payer: Self-pay | Admitting: Hematology and Oncology

## 2015-05-02 ENCOUNTER — Ambulatory Visit: Payer: BLUE CROSS/BLUE SHIELD

## 2015-05-02 ENCOUNTER — Encounter: Payer: Self-pay | Admitting: Hematology and Oncology

## 2015-05-02 ENCOUNTER — Ambulatory Visit (HOSPITAL_BASED_OUTPATIENT_CLINIC_OR_DEPARTMENT_OTHER): Payer: BLUE CROSS/BLUE SHIELD | Admitting: Hematology and Oncology

## 2015-05-02 ENCOUNTER — Other Ambulatory Visit (HOSPITAL_BASED_OUTPATIENT_CLINIC_OR_DEPARTMENT_OTHER): Payer: BLUE CROSS/BLUE SHIELD

## 2015-05-02 VITALS — BP 125/74 | HR 94 | Temp 98.1°F | Resp 18 | Ht 64.0 in | Wt 171.4 lb

## 2015-05-02 DIAGNOSIS — Z5189 Encounter for other specified aftercare: Secondary | ICD-10-CM | POA: Diagnosis not present

## 2015-05-02 DIAGNOSIS — C7801 Secondary malignant neoplasm of right lung: Secondary | ICD-10-CM

## 2015-05-02 DIAGNOSIS — Z171 Estrogen receptor negative status [ER-]: Secondary | ICD-10-CM

## 2015-05-02 DIAGNOSIS — J910 Malignant pleural effusion: Secondary | ICD-10-CM | POA: Diagnosis not present

## 2015-05-02 DIAGNOSIS — E119 Type 2 diabetes mellitus without complications: Secondary | ICD-10-CM | POA: Diagnosis not present

## 2015-05-02 DIAGNOSIS — C7931 Secondary malignant neoplasm of brain: Secondary | ICD-10-CM | POA: Diagnosis not present

## 2015-05-02 DIAGNOSIS — D696 Thrombocytopenia, unspecified: Secondary | ICD-10-CM | POA: Diagnosis not present

## 2015-05-02 DIAGNOSIS — C50911 Malignant neoplasm of unspecified site of right female breast: Secondary | ICD-10-CM

## 2015-05-02 DIAGNOSIS — C50412 Malignant neoplasm of upper-outer quadrant of left female breast: Secondary | ICD-10-CM | POA: Diagnosis not present

## 2015-05-02 DIAGNOSIS — Z95828 Presence of other vascular implants and grafts: Secondary | ICD-10-CM

## 2015-05-02 DIAGNOSIS — C50912 Malignant neoplasm of unspecified site of left female breast: Principal | ICD-10-CM

## 2015-05-02 LAB — CBC WITH DIFFERENTIAL/PLATELET
BASO%: 0.3 % (ref 0.0–2.0)
BASOS ABS: 0 10*3/uL (ref 0.0–0.1)
EOS ABS: 0.1 10*3/uL (ref 0.0–0.5)
EOS%: 1.1 % (ref 0.0–7.0)
HEMATOCRIT: 37.7 % (ref 34.8–46.6)
HEMOGLOBIN: 12.4 g/dL (ref 11.6–15.9)
LYMPH#: 0.4 10*3/uL — AB (ref 0.9–3.3)
LYMPH%: 6.5 % — ABNORMAL LOW (ref 14.0–49.7)
MCH: 31.8 pg (ref 25.1–34.0)
MCHC: 32.8 g/dL (ref 31.5–36.0)
MCV: 97.1 fL (ref 79.5–101.0)
MONO#: 0.3 10*3/uL (ref 0.1–0.9)
MONO%: 5.5 % (ref 0.0–14.0)
NEUT#: 5 10*3/uL (ref 1.5–6.5)
NEUT%: 86.6 % — ABNORMAL HIGH (ref 38.4–76.8)
Platelets: 78 10*3/uL — ABNORMAL LOW (ref 145–400)
RBC: 3.89 10*6/uL (ref 3.70–5.45)
RDW: 19 % — AB (ref 11.2–14.5)
WBC: 5.8 10*3/uL (ref 3.9–10.3)

## 2015-05-02 LAB — COMPREHENSIVE METABOLIC PANEL (CC13)
ALBUMIN: 3.2 g/dL — AB (ref 3.5–5.0)
ALK PHOS: 67 U/L (ref 40–150)
ALT: 23 U/L (ref 0–55)
ANION GAP: 10 meq/L (ref 3–11)
AST: 21 U/L (ref 5–34)
BUN: 20.1 mg/dL (ref 7.0–26.0)
CALCIUM: 8.8 mg/dL (ref 8.4–10.4)
CO2: 25 mEq/L (ref 22–29)
Chloride: 105 mEq/L (ref 98–109)
Creatinine: 0.8 mg/dL (ref 0.6–1.1)
Glucose: 122 mg/dl (ref 70–140)
POTASSIUM: 3.4 meq/L — AB (ref 3.5–5.1)
Sodium: 140 mEq/L (ref 136–145)
Total Bilirubin: 0.59 mg/dL (ref 0.20–1.20)
Total Protein: 5.4 g/dL — ABNORMAL LOW (ref 6.4–8.3)

## 2015-05-02 MED ORDER — SODIUM CHLORIDE 0.9 % IJ SOLN
10.0000 mL | INTRAMUSCULAR | Status: DC | PRN
  Administered 2015-05-02: 10 mL via INTRAVENOUS
  Filled 2015-05-02: qty 10

## 2015-05-02 MED ORDER — SODIUM CHLORIDE 0.9 % IV SOLN
0.9500 mg/m2 | Freq: Once | INTRAVENOUS | Status: AC
  Administered 2015-05-02: 2 mg via INTRAVENOUS
  Filled 2015-05-02: qty 4

## 2015-05-02 MED ORDER — HEPARIN SOD (PORK) LOCK FLUSH 100 UNIT/ML IV SOLN
500.0000 [IU] | Freq: Once | INTRAVENOUS | Status: AC | PRN
  Filled 2015-05-02: qty 5

## 2015-05-02 MED ORDER — PEGFILGRASTIM 6 MG/0.6ML ~~LOC~~ PSKT
6.0000 mg | PREFILLED_SYRINGE | Freq: Once | SUBCUTANEOUS | Status: AC
  Administered 2015-05-02: 6 mg via SUBCUTANEOUS
  Filled 2015-05-02: qty 0.6

## 2015-05-02 MED ORDER — SODIUM CHLORIDE 0.9 % IV SOLN
Freq: Once | INTRAVENOUS | Status: AC
  Administered 2015-05-02: 13:00:00 via INTRAVENOUS
  Filled 2015-05-02: qty 4

## 2015-05-02 MED ORDER — SODIUM CHLORIDE 0.9 % IJ SOLN
10.0000 mL | INTRAMUSCULAR | Status: DC | PRN
  Administered 2015-05-02: 10 mL
  Filled 2015-05-02: qty 10

## 2015-05-02 MED ORDER — SODIUM CHLORIDE 0.9 % IV SOLN
Freq: Once | INTRAVENOUS | Status: AC
  Administered 2015-05-02: 13:00:00 via INTRAVENOUS

## 2015-05-02 MED ORDER — NYSTATIN 100000 UNIT/ML MT SUSP: 5.0000 mL | Freq: Two times a day (BID) | OROMUCOSAL | Status: DC

## 2015-05-02 NOTE — Assessment & Plan Note (Signed)
09/30/2012 :Stage III, triple negative, invasive ductal carcinoma of the left breast. Neoadjuvant chemotherapy following the surgery followed by adjuvant radiation with concurrent Xeloda. 3.9 cm size and 9/13 lymph nodes were positive with extracapsular extension. ER/PR negative HER-2 negative Ki-67 21% March 2016: CT chest revealed bilateral lung nodules biopsy-proven to be triple negative metastatic carcinoma , pleural effusion status post thoracentesis malignant cells on cytology, repeat thoracentesis 12/01/2014 and 12/12/2014 PET CT scan 11/27/2014 showed widespread metastatic disease to the thorax with multiple pulmonary nodules and masses and malignant right pleural effusion and right hilar and mediastinal malignant lymph nodes. Brain MRI 04/03/2015: Widespread intracranial metastases involving both cerebral hemispheres and cerebellum status post whole brain radiation PET/CT 04/24/2015 Previously described extensive pulmonary and right pleural nodularity has improved largest lung nodule right lower lobe 2.4 cm, right middle lobe 12 mm, no activity left lung, right pleural effusion resolved. No bone or liver metastases. -------------------------------------------------------------------------------------------------------------------------- Current treatment: Cycle 7 Halaven (day 1 and 8 Q 3 weeks) Diabetes mellitus: On insulin Thrush: Improved with nystatin Return to clinic in 3 weeks for follow-up 

## 2015-05-02 NOTE — Patient Instructions (Signed)

## 2015-05-02 NOTE — Progress Notes (Signed)
Patient Care Team: Lucretia Kern, DO as PCP - General (Family Medicine)  DIAGNOSIS: Breast cancer of upper-outer quadrant of left female breast   Staging form: Breast, AJCC 7th Edition     Clinical: Stage IIIB (T4, N1, cM0) - Signed by Thea Silversmith, MD on 10/29/2012       Prognostic indicators: ER-, PR- HER2 -      Pathologic: No stage assigned - Unsigned       Prognostic indicators: ER-, PR- HER2 -    SUMMARY OF ONCOLOGIC HISTORY:   Breast cancer of upper-outer quadrant of left female breast   10/06/1986 Initial Diagnosis Right breast mastectomy, breast cancer diagnosed during pregnancy, no adjuvant treatment given   09/30/2012 Initial Biopsy Left breast biopsy done for left nipple retraction: Invasive ductal carcinoma grade 3, ER/PR negative, HER-2 negative, Ki-67 21% with grade 3 DCIS   10/12/2012 Breast MRI Left breast diffuse enhancement measuring 13 x 12.5 x 9.5 cm with diffuse skin thickening and dermal enhancement, multiple enlarged level I left axillary lymph nodes largest 2.4 cm   11/02/2012 - 06/06/2013 Neo-Adjuvant Chemotherapy Neoadjuvant FEC 4 followed by Taxol carbo 8 discontinued for neuropathy, Gemzar Carbo 2   09/15/2013 - 11/01/2013 Radiation Therapy Xeloda with adjuvant radiation   10/30/2014 Imaging Right hilar node 1.6 cm, subcarinal 1.2 cm, 1 cm additional right hilar, right lower lobe 3.2 cm, RUL 1 cm, and LDL of 1.3 cm, moderate pleural effusion, right liver 0.8 cm, 15 cm fluid collection left chest wall   10/31/2014 Initial Biopsy Lung biopsy right lower lobe: Carcinoma with Extensive necrosis, microscopic foci of nonnecrotic non-small cell, poorly differentiated carcinoma, metastatic disease likely ER 0%, PR 0%, HER-2 negative   12/01/2014 Procedure Second thoracentesis for malignant pleural effusion   12/06/2014 -  Chemotherapy Halaven day 1 day 8 every 3 weeks   02/11/2015 - 02/13/2015 Hospital Admission Hospitalization for gastroenteritis, accompanied by nausea vomiting  dehydration and hypokalemia   04/06/2015 Relapse/Recurrence Brain MRI showing diffuse bilateral brain metastasis   04/06/2015 - 04/25/2015 Radiation Therapy Palliative radiation therapy to the brain 15 fractions   04/24/2015 PET scan Previously described extensive pulmonary and right pleural nodularity has improved largest lung nodule right lower lobe 2.4 cm, right middle lobe 12 mm, no activity left lung, right pleural effusion resolved. No bone or liver metastases    CHIEF COMPLIANT: Cycle 7 halaven  INTERVAL HISTORY: Christine [name] is a 59 year old with above-mentioned history of metastatic breast cancer currently on palliative chemotherapy with Halaven. She had a PET CT scan which showed excellent response in the lung and liver metastases. She is here to resume chemotherapy. She denies any neuropathy. She continues to have occasional vertigo. Denies any nausea vomiting.  REVIEW OF SYSTEMS:   Constitutional: Denies fevers, chills or abnormal weight loss Eyes: Denies blurriness of vision Ears, nose, mouth, throat, and face: Denies mucositis or sore throat Respiratory: Denies cough, dyspnea or wheezes Cardiovascular: Denies palpitation, chest discomfort or lower extremity swelling Gastrointestinal:  Denies nausea, heartburn or change in bowel habits Skin: Denies abnormal skin rashes Lymphatics: Denies new lymphadenopathy or easy bruising Neurological: Occasional vertigo Behavioral/Psych: Mood is stable, no new changes  All other systems were reviewed with the patient and are negative.  I have reviewed the past medical history, past surgical history, social history and family history with the patient and they are unchanged from previous note.  ALLERGIES:  is allergic to sulfa antibiotics; sulfonamide derivatives; and tomato.  MEDICATIONS:  Current Outpatient Prescriptions  Medication  Sig Dispense Refill  . cetirizine (ZYRTEC) 10 MG tablet Take 10 mg by mouth daily as needed for allergies.      . cholecalciferol (VITAMIN D) 1000 UNITS tablet Take 1,000 Units by mouth every morning.     Marland Kitchen dexamethasone (DECADRON) 4 MG tablet Take 1 tablet (4 mg total) by mouth 3 (three) times daily. 90 tablet 0  . diltiazem (CARDIZEM CD) 120 MG 24 hr capsule Take 1 capsule (120 mg total) by mouth every morning. 90 capsule 1  . esomeprazole (NEXIUM) 20 MG capsule Take 20 mg by mouth daily as needed (acid).     . fluticasone (FLONASE) 50 MCG/ACT nasal spray Place 2 sprays into both nostrils daily as needed for allergies or rhinitis.    Marland Kitchen insulin glargine (LANTUS) 100 UNIT/ML injection Inject 0.08 mLs (8 Units total) into the skin at bedtime. 10 mL 11  . KLOR-CON M20 20 MEQ tablet TAKE 1 TABLET (20 MEQ TOTAL) BY MOUTH 2 (TWO) TIMES DAILY. 60 tablet 0  . KLOR-CON M20 20 MEQ tablet Take 1 tablet (20 mEq total) by mouth 2 (two) times daily. 60 tablet 0  . latanoprost (XALATAN) 0.005 % ophthalmic solution Place 1 drop into both eyes at bedtime.     Marland Kitchen levothyroxine (SYNTHROID, LEVOTHROID) 50 MCG tablet Take 1 tablet (50 mcg total) by mouth daily. 90 tablet 3  . lidocaine-prilocaine (EMLA) cream Apply to affected area once (Patient taking differently: Apply 1 application topically as needed (for chemo). Apply to affected area once) 30 g 3  . LORazepam (ATIVAN) 0.5 MG tablet Take 1 tablet (0.5 mg total) by mouth every 6 (six) hours as needed (Nausea or vomiting). 30 tablet 0  . metFORMIN (GLUCOPHAGE) 500 MG tablet Take 1022m (2 tabs) in the morning and 5059m(1 tab) in the evening with meals 270 tablet 3  . nystatin (MYCOSTATIN) 100000 UNIT/ML suspension TAKE 5 MLS (500,000 UNITS TOTAL) BY MOUTH 4 (FOUR) TIMES DAILY. 60 mL 0  . ondansetron (ZOFRAN) 8 MG tablet Take 1 tablet (8 mg total) by mouth every 8 (eight) hours as needed for nausea or vomiting. 30 tablet 1  . oxyCODONE (OXY IR/ROXICODONE) 5 MG immediate release tablet Take 1 tablet (5 mg total) by mouth every 4 (four) hours as needed for moderate pain. 30  tablet 0  . prochlorperazine (COMPAZINE) 10 MG tablet Take 1 tablet (10 mg total) by mouth every 6 (six) hours as needed (Nausea or vomiting). 30 tablet 1  . Tetrahydrozoline HCl (VISINE OP) Apply 1-2 drops to eye daily as needed (itchy eyes.).    . Marland Kitchenimolol (TIMOPTIC) 0.5 % ophthalmic solution Place 1 drop into both eyes 2 (two) times daily.     No current facility-administered medications for this visit.    PHYSICAL EXAMINATION: ECOG PERFORMANCE STATUS: 1 - Symptomatic but completely ambulatory  Filed Vitals:   05/02/15 1141  BP: 125/74  Pulse: 94  Temp: 98.1 F (36.7 C)  Resp: 18   Filed Weights   05/02/15 1141  Weight: 171 lb 6.4 oz (77.747 kg)    GENERAL:alert, no distress and comfortable SKIN: skin color, texture, turgor are normal, no rashes or significant lesions EYES: normal, Conjunctiva are pink and non-injected, sclera clear OROPHARYNX:no exudate, no erythema and lips, buccal mucosa, and tongue normal  NECK: supple, thyroid normal size, non-tender, without nodularity LYMPH:  no palpable lymphadenopathy in the cervical, axillary or inguinal LUNGS: clear to auscultation and percussion with normal breathing effort HEART: regular rate & rhythm and no  murmurs and no lower extremity edema ABDOMEN:abdomen soft, non-tender and normal bowel sounds Musculoskeletal:no cyanosis of digits and no clubbing  NEURO: alert & oriented x 3 with fluent speech, no focal motor/sensory deficits  LABORATORY DATA:  I have reviewed the data as listed   Chemistry      Component Value Date/Time   NA 136 04/11/2015 1000   NA 142 04/05/2015 0515   K 4.9 04/11/2015 1000   K 3.7 04/05/2015 0515   CL 104 04/05/2015 0515   CL 101 01/25/2013 0952   CO2 21* 04/11/2015 1000   CO2 30 04/05/2015 0515   BUN 34.4* 04/11/2015 1000   BUN 16 04/05/2015 0515   CREATININE 0.9 04/11/2015 1000   CREATININE 0.73 04/05/2015 0515      Component Value Date/Time   CALCIUM 9.4 04/11/2015 1000   CALCIUM  8.9 04/05/2015 0515   ALKPHOS 125 04/11/2015 1000   ALKPHOS 78 02/12/2015 0531   AST 14 04/11/2015 1000   AST 17 02/12/2015 0531   ALT 21 04/11/2015 1000   ALT 11* 02/12/2015 0531   BILITOT 0.74 04/11/2015 1000   BILITOT 1.1 02/12/2015 0531       Lab Results  Component Value Date   WBC 5.8 05/02/2015   HGB 12.4 05/02/2015   HCT 37.7 05/02/2015   MCV 97.1 05/02/2015   PLT 78* 05/02/2015   NEUTROABS 5.0 05/02/2015    ASSESSMENT & PLAN:  Breast cancer of upper-outer quadrant of left female breast 09/30/2012 :Stage III, triple negative, invasive ductal carcinoma of the left breast. Neoadjuvant chemotherapy following the surgery followed by adjuvant radiation with concurrent Xeloda. 3.9 cm size and 9/13 lymph nodes were positive with extracapsular extension. ER/PR negative HER-2 negative Ki-67 21% March 2016: CT chest revealed bilateral lung nodules biopsy-proven to be triple negative metastatic carcinoma , pleural effusion status post thoracentesis malignant cells on cytology, repeat thoracentesis 12/01/2014 and 12/12/2014 PET CT scan 11/27/2014 showed widespread metastatic disease to the thorax with multiple pulmonary nodules and masses and malignant right pleural effusion and right hilar and mediastinal malignant lymph nodes. Brain MRI 04/03/2015: Widespread intracranial metastases involving both cerebral hemispheres and cerebellum status post whole brain radiation PET/CT 04/24/2015 Previously described extensive pulmonary and right pleural nodularity has improved largest lung nodule right lower lobe 2.4 cm, right middle lobe 12 mm, no activity left lung, right pleural effusion resolved. No bone or liver metastases. -------------------------------------------------------------------------------------------------------------------------- Current treatment: Cycle 7 Halaven (changed to every 2 weeks) Diabetes mellitus: On insulin Thrush: Improved with nystatin, prescribed more nystatin  for her today. 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 I decreased the dosage of chemotherapy as well as a frequency.  Return to clinic in 3 weeks for follow-up  No orders of the defined types were placed in this encounter.   The patient has a good understanding of the overall plan. she agrees with it. she will call with any problems that may develop before the next visit here.   Rulon Eisenmenger, MD

## 2015-05-02 NOTE — Patient Instructions (Signed)
[facility] Discharge Instructions for Patients Receiving Chemotherapy  Today you received the following chemotherapy agents:  Halavan  To help prevent nausea and vomiting after your treatment, we encourage you to take your nausea medication as prescribed.   If you develop nausea and vomiting that is not controlled by your nausea medication, call the clinic.   BELOW ARE SYMPTOMS THAT SHOULD BE REPORTED IMMEDIATELY:  *FEVER GREATER THAN 100.5 F  *CHILLS WITH OR WITHOUT FEVER  NAUSEA AND VOMITING THAT IS NOT CONTROLLED WITH YOUR NAUSEA MEDICATION  *UNUSUAL SHORTNESS OF BREATH  *UNUSUAL BRUISING OR BLEEDING  TENDERNESS IN MOUTH AND THROAT WITH OR WITHOUT PRESENCE OF ULCERS  *URINARY PROBLEMS  *BOWEL PROBLEMS  UNUSUAL RASH Items with * indicate a potential emergency and should be followed up as soon as possible.  Feel free to call the clinic you have any questions or concerns. The clinic phone number is [phone].  Please show the CHEMO ALERT CARD at check-in to the Emergency Department and triage nurse.   

## 2015-05-02 NOTE — Telephone Encounter (Signed)
Gave avs & calendar for October. °

## 2015-05-03 ENCOUNTER — Encounter: Payer: Self-pay | Admitting: Radiation Oncology

## 2015-05-03 NOTE — Progress Notes (Signed)
  Radiation Oncology         [phone] ________________________________  Name: Christine [name] MRN: [id number]  Date: 05/03/2015  DOB: 06-Oct-1955  End of Treatment Note  Diagnosis:   Metastatic breast cancer to brain.     Indication for treatment: Palliative  Radiation treatment dates: 10/26/2013-04/25/2015  Site/dose:  Whole Brain 35 Gy at 2.5 Gy per fraction X 14 fractions  Beams/energy: Opposed laterals 6 MV photons  Narrative: The patient tolerated radiation treatment relatively well. She was able to be weaned off of her steroids. She was treated with nystatin for thrush.  Plan: The patient has completed radiation treatment. The patient will return to radiation oncology clinic for routine followup in one month. I advised them to call or return sooner if they have any questions or concerns related to their recovery or treatment.  This document serves as a record of services personally performed by Thea Silversmith , MD. It was created on her behalf by Lenn Cal, a trained medical scribe. The creation of this record is based on the scribe's personal observations and the provider's statements to them. This document has been checked and approved by the attending provider.   ------------------------------------------------  Thea Silversmith, MD

## 2015-05-09 ENCOUNTER — Emergency Department (HOSPITAL_COMMUNITY): Payer: BLUE CROSS/BLUE SHIELD

## 2015-05-09 ENCOUNTER — Encounter (HOSPITAL_COMMUNITY): Payer: Self-pay

## 2015-05-09 ENCOUNTER — Inpatient Hospital Stay (HOSPITAL_COMMUNITY)
Admission: EM | Admit: 2015-05-09 | Discharge: 2015-05-11 | DRG: 054 | Disposition: A | Discharge status: Home Health | Payer: BLUE CROSS/BLUE SHIELD | Attending: Internal Medicine | Admitting: Internal Medicine

## 2015-05-09 ENCOUNTER — Ambulatory Visit: Payer: BLUE CROSS/BLUE SHIELD

## 2015-05-09 ENCOUNTER — Other Ambulatory Visit: Payer: BLUE CROSS/BLUE SHIELD

## 2015-05-09 DIAGNOSIS — H409 Unspecified glaucoma: Secondary | ICD-10-CM | POA: Diagnosis present

## 2015-05-09 DIAGNOSIS — J90 Pleural effusion, not elsewhere classified: Secondary | ICD-10-CM | POA: Diagnosis present

## 2015-05-09 DIAGNOSIS — E119 Type 2 diabetes mellitus without complications: Secondary | ICD-10-CM | POA: Diagnosis present

## 2015-05-09 DIAGNOSIS — J910 Malignant pleural effusion: Secondary | ICD-10-CM | POA: Diagnosis present

## 2015-05-09 DIAGNOSIS — D6959 Other secondary thrombocytopenia: Secondary | ICD-10-CM | POA: Diagnosis present

## 2015-05-09 DIAGNOSIS — E559 Vitamin D deficiency, unspecified: Secondary | ICD-10-CM | POA: Diagnosis present

## 2015-05-09 DIAGNOSIS — Z86010 Personal history of colonic polyps: Secondary | ICD-10-CM

## 2015-05-09 DIAGNOSIS — D638 Anemia in other chronic diseases classified elsewhere: Secondary | ICD-10-CM | POA: Diagnosis present

## 2015-05-09 DIAGNOSIS — I10 Essential (primary) hypertension: Secondary | ICD-10-CM | POA: Diagnosis present

## 2015-05-09 DIAGNOSIS — G936 Cerebral edema: Secondary | ICD-10-CM | POA: Diagnosis present

## 2015-05-09 DIAGNOSIS — Z923 Personal history of irradiation: Secondary | ICD-10-CM | POA: Diagnosis not present

## 2015-05-09 DIAGNOSIS — Z794 Long term (current) use of insulin: Secondary | ICD-10-CM

## 2015-05-09 DIAGNOSIS — R4701 Aphasia: Secondary | ICD-10-CM | POA: Diagnosis present

## 2015-05-09 DIAGNOSIS — Z9981 Dependence on supplemental oxygen: Secondary | ICD-10-CM

## 2015-05-09 DIAGNOSIS — G9340 Encephalopathy, unspecified: Secondary | ICD-10-CM | POA: Diagnosis present

## 2015-05-09 DIAGNOSIS — K589 Irritable bowel syndrome without diarrhea: Secondary | ICD-10-CM | POA: Diagnosis present

## 2015-05-09 DIAGNOSIS — E039 Hypothyroidism, unspecified: Secondary | ICD-10-CM | POA: Diagnosis present

## 2015-05-09 DIAGNOSIS — E876 Hypokalemia: Secondary | ICD-10-CM | POA: Diagnosis present

## 2015-05-09 DIAGNOSIS — J9621 Acute and chronic respiratory failure with hypoxia: Secondary | ICD-10-CM | POA: Diagnosis present

## 2015-05-09 DIAGNOSIS — Z9013 Acquired absence of bilateral breasts and nipples: Secondary | ICD-10-CM

## 2015-05-09 DIAGNOSIS — T451X5A Adverse effect of antineoplastic and immunosuppressive drugs, initial encounter: Secondary | ICD-10-CM | POA: Diagnosis present

## 2015-05-09 DIAGNOSIS — I272 Other secondary pulmonary hypertension: Secondary | ICD-10-CM | POA: Diagnosis present

## 2015-05-09 DIAGNOSIS — D630 Anemia in neoplastic disease: Secondary | ICD-10-CM | POA: Diagnosis present

## 2015-05-09 DIAGNOSIS — Z79891 Long term (current) use of opiate analgesic: Secondary | ICD-10-CM | POA: Diagnosis not present

## 2015-05-09 DIAGNOSIS — R9431 Abnormal electrocardiogram [ECG] [EKG]: Secondary | ICD-10-CM

## 2015-05-09 DIAGNOSIS — Z515 Encounter for palliative care: Secondary | ICD-10-CM | POA: Insufficient documentation

## 2015-05-09 DIAGNOSIS — R000 Tachycardia, unspecified: Secondary | ICD-10-CM | POA: Diagnosis present

## 2015-05-09 DIAGNOSIS — K219 Gastro-esophageal reflux disease without esophagitis: Secondary | ICD-10-CM | POA: Diagnosis present

## 2015-05-09 DIAGNOSIS — Z79899 Other long term (current) drug therapy: Secondary | ICD-10-CM | POA: Diagnosis not present

## 2015-05-09 DIAGNOSIS — Z882 Allergy status to sulfonamides status: Secondary | ICD-10-CM | POA: Diagnosis not present

## 2015-05-09 DIAGNOSIS — C50412 Malignant neoplasm of upper-outer quadrant of left female breast: Secondary | ICD-10-CM | POA: Diagnosis present

## 2015-05-09 DIAGNOSIS — Z803 Family history of malignant neoplasm of breast: Secondary | ICD-10-CM | POA: Diagnosis not present

## 2015-05-09 DIAGNOSIS — J309 Allergic rhinitis, unspecified: Secondary | ICD-10-CM | POA: Diagnosis present

## 2015-05-09 DIAGNOSIS — Z8249 Family history of ischemic heart disease and other diseases of the circulatory system: Secondary | ICD-10-CM

## 2015-05-09 DIAGNOSIS — Z800 Family history of malignant neoplasm of digestive organs: Secondary | ICD-10-CM | POA: Diagnosis not present

## 2015-05-09 DIAGNOSIS — C7800 Secondary malignant neoplasm of unspecified lung: Secondary | ICD-10-CM | POA: Diagnosis present

## 2015-05-09 DIAGNOSIS — F419 Anxiety disorder, unspecified: Secondary | ICD-10-CM | POA: Diagnosis present

## 2015-05-09 DIAGNOSIS — C7931 Secondary malignant neoplasm of brain: Secondary | ICD-10-CM | POA: Diagnosis present

## 2015-05-09 DIAGNOSIS — B379 Candidiasis, unspecified: Secondary | ICD-10-CM | POA: Diagnosis present

## 2015-05-09 DIAGNOSIS — D696 Thrombocytopenia, unspecified: Secondary | ICD-10-CM | POA: Diagnosis present

## 2015-05-09 DIAGNOSIS — Z833 Family history of diabetes mellitus: Secondary | ICD-10-CM | POA: Diagnosis not present

## 2015-05-09 DIAGNOSIS — Z853 Personal history of malignant neoplasm of breast: Secondary | ICD-10-CM

## 2015-05-09 DIAGNOSIS — R4182 Altered mental status, unspecified: Secondary | ICD-10-CM | POA: Diagnosis present

## 2015-05-09 DIAGNOSIS — Z66 Do not resuscitate: Secondary | ICD-10-CM | POA: Diagnosis present

## 2015-05-09 DIAGNOSIS — B370 Candidal stomatitis: Secondary | ICD-10-CM | POA: Diagnosis present

## 2015-05-09 DIAGNOSIS — C50919 Malignant neoplasm of unspecified site of unspecified female breast: Secondary | ICD-10-CM

## 2015-05-09 LAB — RAPID URINE DRUG SCREEN, HOSP PERFORMED
AMPHETAMINES: NOT DETECTED
Barbiturates: NOT DETECTED
Benzodiazepines: NOT DETECTED
Cocaine: NOT DETECTED
Opiates: NOT DETECTED
TETRAHYDROCANNABINOL: NOT DETECTED

## 2015-05-09 LAB — COMPREHENSIVE METABOLIC PANEL
ALBUMIN: 3.2 g/dL — AB (ref 3.5–5.0)
ALK PHOS: 70 U/L (ref 38–126)
ALT: 21 U/L (ref 14–54)
ANION GAP: 9 (ref 5–15)
AST: 22 U/L (ref 15–41)
BILIRUBIN TOTAL: 0.8 mg/dL (ref 0.3–1.2)
BUN: 11 mg/dL (ref 6–20)
CALCIUM: 7.2 mg/dL — AB (ref 8.9–10.3)
CO2: 27 mmol/L (ref 22–32)
Chloride: 106 mmol/L (ref 101–111)
Creatinine, Ser: 0.47 mg/dL (ref 0.44–1.00)
GLUCOSE: 138 mg/dL — AB (ref 65–99)
POTASSIUM: 3.1 mmol/L — AB (ref 3.5–5.1)
Sodium: 142 mmol/L (ref 135–145)
TOTAL PROTEIN: 6.3 g/dL — AB (ref 6.5–8.1)

## 2015-05-09 LAB — URINALYSIS, ROUTINE W REFLEX MICROSCOPIC
BILIRUBIN URINE: NEGATIVE
Glucose, UA: NEGATIVE mg/dL
HGB URINE DIPSTICK: NEGATIVE
Ketones, ur: NEGATIVE mg/dL
Leukocytes, UA: NEGATIVE
Nitrite: NEGATIVE
PROTEIN: NEGATIVE mg/dL
Specific Gravity, Urine: 1.011 (ref 1.005–1.030)
UROBILINOGEN UA: 1 mg/dL (ref 0.0–1.0)
pH: 6 (ref 5.0–8.0)

## 2015-05-09 LAB — CBC WITH DIFFERENTIAL/PLATELET
BASOS ABS: 0 10*3/uL (ref 0.0–0.1)
Basophils Relative: 0 %
EOS ABS: 0 10*3/uL (ref 0.0–0.7)
Eosinophils Relative: 0 %
HCT: 33 % — ABNORMAL LOW (ref 36.0–46.0)
Hemoglobin: 10.6 g/dL — ABNORMAL LOW (ref 12.0–15.0)
LYMPHS PCT: 17 %
Lymphs Abs: 1.1 10*3/uL (ref 0.7–4.0)
MCH: 32.1 pg (ref 26.0–34.0)
MCHC: 32.1 g/dL (ref 30.0–36.0)
MCV: 100 fL (ref 78.0–100.0)
Monocytes Absolute: 0.3 10*3/uL (ref 0.1–1.0)
Monocytes Relative: 5 %
NEUTROS ABS: 4.9 10*3/uL (ref 1.7–7.7)
Neutrophils Relative %: 78 %
PLATELETS: 44 10*3/uL — AB (ref 150–400)
RBC: 3.3 MIL/uL — AB (ref 3.87–5.11)
RDW: 18.5 % — AB (ref 11.5–15.5)
WBC: 6.3 10*3/uL (ref 4.0–10.5)

## 2015-05-09 LAB — I-STAT CG4 LACTIC ACID, ED: Lactic Acid, Venous: 0.8 mmol/L (ref 0.5–2.0)

## 2015-05-09 LAB — PROTIME-INR
INR: 1.04 (ref 0.00–1.49)
Prothrombin Time: 13.8 seconds (ref 11.6–15.2)

## 2015-05-09 LAB — CBG MONITORING, ED
Glucose-Capillary: 115 mg/dL — ABNORMAL HIGH (ref 65–99)
Glucose-Capillary: 129 mg/dL — ABNORMAL HIGH (ref 65–99)

## 2015-05-09 LAB — ETHANOL

## 2015-05-09 LAB — APTT: aPTT: 32 seconds (ref 24–37)

## 2015-05-09 LAB — TROPONIN I

## 2015-05-09 MED ORDER — DEXAMETHASONE SODIUM PHOSPHATE 4 MG/ML IJ SOLN
4.0000 mg | Freq: Four times a day (QID) | INTRAMUSCULAR | Status: DC
  Administered 2015-05-09 – 2015-05-11 (×7): 4 mg via INTRAVENOUS
  Filled 2015-05-09 (×7): qty 1

## 2015-05-09 MED ORDER — TIMOLOL MALEATE 0.5 % OP SOLN
1.0000 [drp] | Freq: Two times a day (BID) | OPHTHALMIC | Status: DC
  Administered 2015-05-09 – 2015-05-11 (×4): 1 [drp] via OPHTHALMIC
  Filled 2015-05-09: qty 5

## 2015-05-09 MED ORDER — LEVOTHYROXINE SODIUM 25 MCG PO TABS
50.0000 ug | ORAL_TABLET | Freq: Every day | ORAL | Status: DC
  Administered 2015-05-10 – 2015-05-11 (×2): 50 ug via ORAL
  Filled 2015-05-09 (×2): qty 2

## 2015-05-09 MED ORDER — LORATADINE 10 MG PO TABS
10.0000 mg | ORAL_TABLET | Freq: Every day | ORAL | Status: DC
  Administered 2015-05-10 – 2015-05-11 (×2): 10 mg via ORAL
  Filled 2015-05-09 (×2): qty 1

## 2015-05-09 MED ORDER — SODIUM CHLORIDE 0.9 % IV SOLN
INTRAVENOUS | Status: DC
  Administered 2015-05-10: 02:00:00 via INTRAVENOUS

## 2015-05-09 MED ORDER — SODIUM CHLORIDE 0.9 % IV SOLN
100.0000 mL/h | INTRAVENOUS | Status: DC
  Administered 2015-05-09: 100 mL/h via INTRAVENOUS

## 2015-05-09 MED ORDER — TETRAHYDROZOLINE HCL 0.05 % OP SOLN
1.0000 [drp] | Freq: Every day | OPHTHALMIC | Status: DC | PRN
  Filled 2015-05-09: qty 15

## 2015-05-09 MED ORDER — SODIUM CHLORIDE 0.9 % IV BOLUS (SEPSIS)
500.0000 mL | Freq: Once | INTRAVENOUS | Status: AC
  Administered 2015-05-09: 500 mL via INTRAVENOUS

## 2015-05-09 MED ORDER — OXYCODONE HCL 5 MG PO TABS: 5.0000 mg | ORAL_TABLET | ORAL | Status: DC | PRN

## 2015-05-09 MED ORDER — LATANOPROST 0.005 % OP SOLN
1.0000 [drp] | Freq: Every day | OPHTHALMIC | Status: DC
  Administered 2015-05-09 – 2015-05-10 (×2): 1 [drp] via OPHTHALMIC
  Filled 2015-05-09: qty 2.5

## 2015-05-09 MED ORDER — ALUM & MAG HYDROXIDE-SIMETH 200-200-20 MG/5ML PO SUSP: 30.0000 mL | Freq: Four times a day (QID) | ORAL | Status: DC | PRN

## 2015-05-09 MED ORDER — LORAZEPAM 2 MG/ML IJ SOLN
1.0000 mg | Freq: Once | INTRAMUSCULAR | Status: DC
Start: 2015-05-09 — End: 2015-05-11
  Filled 2015-05-09: qty 1

## 2015-05-09 MED ORDER — VITAMIN D 1000 UNITS PO TABS
1000.0000 [IU] | ORAL_TABLET | Freq: Every morning | ORAL | Status: DC
Start: 2015-05-10 — End: 2015-05-11
  Administered 2015-05-10 – 2015-05-11 (×2): 1000 [IU] via ORAL
  Filled 2015-05-09 (×2): qty 1

## 2015-05-09 MED ORDER — ACETAMINOPHEN 650 MG RE SUPP: 650.0000 mg | Freq: Four times a day (QID) | RECTAL | Status: DC | PRN

## 2015-05-09 MED ORDER — ONDANSETRON HCL 4 MG/2ML IJ SOLN: 4.0000 mg | Freq: Four times a day (QID) | INTRAMUSCULAR | Status: DC | PRN

## 2015-05-09 MED ORDER — INSULIN GLARGINE 100 UNIT/ML ~~LOC~~ SOLN
8.0000 [IU] | Freq: Every day | SUBCUTANEOUS | Status: DC
  Administered 2015-05-09 – 2015-05-10 (×2): 8 [IU] via SUBCUTANEOUS
  Filled 2015-05-09 (×3): qty 0.08

## 2015-05-09 MED ORDER — POLYETHYLENE GLYCOL 3350 17 G PO PACK: 17.0000 g | PACK | Freq: Every day | ORAL | Status: DC | PRN

## 2015-05-09 MED ORDER — INSULIN ASPART 100 UNIT/ML ~~LOC~~ SOLN
0.0000 [IU] | SUBCUTANEOUS | Status: DC
  Administered 2015-05-09 – 2015-05-10 (×3): 2 [IU] via SUBCUTANEOUS
  Administered 2015-05-10: 3 [IU] via SUBCUTANEOUS
  Administered 2015-05-10: 1 [IU] via SUBCUTANEOUS
  Administered 2015-05-10 (×2): 2 [IU] via SUBCUTANEOUS
  Administered 2015-05-11 (×2): 5 [IU] via SUBCUTANEOUS
  Administered 2015-05-11: 3 [IU] via SUBCUTANEOUS
  Administered 2015-05-11: 2 [IU] via SUBCUTANEOUS
  Administered 2015-05-11: 5 [IU] via SUBCUTANEOUS

## 2015-05-09 MED ORDER — POTASSIUM CHLORIDE 10 MEQ/100ML IV SOLN
10.0000 meq | INTRAVENOUS | Status: AC
  Administered 2015-05-09 – 2015-05-10 (×4): 10 meq via INTRAVENOUS
  Filled 2015-05-09: qty 100

## 2015-05-09 MED ORDER — DILTIAZEM HCL ER COATED BEADS 120 MG PO CP24
120.0000 mg | ORAL_CAPSULE | Freq: Every morning | ORAL | Status: DC
  Administered 2015-05-10 – 2015-05-11 (×2): 120 mg via ORAL
  Filled 2015-05-09 (×2): qty 1

## 2015-05-09 MED ORDER — SODIUM CHLORIDE 0.9 % IV SOLN: INTRAVENOUS | Status: DC

## 2015-05-09 MED ORDER — DEXAMETHASONE SODIUM PHOSPHATE 10 MG/ML IJ SOLN
10.0000 mg | Freq: Once | INTRAMUSCULAR | Status: AC
  Administered 2015-05-09: 10 mg via INTRAVENOUS
  Filled 2015-05-09: qty 1

## 2015-05-09 MED ORDER — ONDANSETRON HCL 4 MG PO TABS: 4.0000 mg | ORAL_TABLET | Freq: Four times a day (QID) | ORAL | Status: DC | PRN

## 2015-05-09 MED ORDER — NYSTATIN 100000 UNIT/ML MT SUSP
5.0000 mL | Freq: Two times a day (BID) | OROMUCOSAL | Status: DC
Start: 2015-05-09 — End: 2015-05-11
  Administered 2015-05-09 – 2015-05-11 (×4): 500000 [IU] via ORAL
  Filled 2015-05-09 (×4): qty 5

## 2015-05-09 MED ORDER — ACETAMINOPHEN 325 MG PO TABS: 650.0000 mg | ORAL_TABLET | Freq: Four times a day (QID) | ORAL | Status: DC | PRN

## 2015-05-09 MED ORDER — PANTOPRAZOLE SODIUM 40 MG PO TBEC: 40.0000 mg | DELAYED_RELEASE_TABLET | Freq: Every day | ORAL | Status: DC | PRN

## 2015-05-09 MED ORDER — LORAZEPAM 0.5 MG PO TABS: 0.5000 mg | ORAL_TABLET | Freq: Four times a day (QID) | ORAL | Status: DC | PRN

## 2015-05-09 MED ORDER — SODIUM CHLORIDE 0.9 % IJ SOLN
3.0000 mL | Freq: Two times a day (BID) | INTRAMUSCULAR | Status: DC
  Administered 2015-05-10 (×2): 3 mL via INTRAVENOUS

## 2015-05-09 MED ORDER — GADOBENATE DIMEGLUMINE 529 MG/ML IV SOLN
15.0000 mL | Freq: Once | INTRAVENOUS | Status: AC | PRN
  Administered 2015-05-09: 15 mL via INTRAVENOUS

## 2015-05-09 MED ORDER — PROCHLORPERAZINE MALEATE 10 MG PO TABS: 10.0000 mg | ORAL_TABLET | Freq: Four times a day (QID) | ORAL | Status: DC | PRN

## 2015-05-09 MED ORDER — FLUTICASONE PROPIONATE 50 MCG/ACT NA SUSP
2.0000 | Freq: Every day | NASAL | Status: DC | PRN
  Filled 2015-05-09: qty 16

## 2015-05-09 MED ORDER — DILTIAZEM HCL ER COATED BEADS 120 MG PO CP24: 120.0000 mg | ORAL_CAPSULE | Freq: Every morning | ORAL | Status: DC

## 2015-05-09 MED ORDER — POTASSIUM CHLORIDE CRYS ER 20 MEQ PO TBCR: 20.0000 meq | EXTENDED_RELEASE_TABLET | Freq: Three times a day (TID) | ORAL | Status: DC

## 2015-05-09 NOTE — ED Notes (Addendum)
Pt presents w/ altered mental status and expressive aphasia.  Last seen normal around 0500.  Pt is alert, but disoriented x 4.  Pt seems to be trying to answer the questions, but just shakes head and sts "it's just not right."  Hx of breast CA.  Last chemo 9/28.  R sided weakness noted w/ grips, pushes and pulls and when asked to smile, Pt just nodded and said "ok."

## 2015-05-09 NOTE — ED Notes (Addendum)
Pt currently being treated for breast cancer, mets to brain. Last chemo 9/28.  rn performed stroke swallow screen and baseline neuro check at 1402, pt would only open eyes and say the word "ok" when asked questions or given commands.          rn went out of room to alert md of pts status, sister during that time prayed for pt. rn spoke with md, more orders placed by md, and rn immediately returned to room.        Pt kept eyes open and saying "thank you Jesus and hallelujah."  When asked pt lifted right arm, left arm, leg leg, and right leg. Pt smiled and showed teeth. Still right facial droop noted. Pt speaking sentences.        rn putting a chuck pad under pt. rn assisted pt bending knees, pt when asked able to push/ lift her buttocks upwards, so rn could slide chuck under buttocks.      Pt still unable to answer questions when directly asked, pt mumbles and tries to answer, but answers and not appropriate. Pt unencouraged will speak sentences and respond to family members talking.      New family member arrived, pt saw family member and said "Good Lord" and started laughing. Since pt keeps responding with sister (with ringlets) at bedside reading scriptures, rn allowing sister to stay at bedside and continue.   rn got more history from family        Husband says at 24 he went to check on pt, pt was alert and at baseline, asked her how she was doing she said that "her ears felt stopped up". Pt did not notice anything in ears. Pt said "let me just rest right now".  At  [phone] pt sent text to husband saying "good morning". Sister received text at 61 saying "sorry, I hit the wrong button".       When sister arrived at 24 pt was up out of the bed trying to cut the alarm off, but she couldn't turn the alarm off, she couldn't remember the code. Pt was talking, but "was not making sense" .

## 2015-05-09 NOTE — ED Notes (Signed)
At 1324 Pt was trying to speak, not making comprehensible sentences, did manage to state "confused".   Now pt only saying "ok" to any questions asked or commands given. Pt opens eyes very seldomly, does use left hand to occasionally touch face, have seen no movement with right hand. Pt has right side lip/face droop.   Pt did lift head when she heard family members voice.

## 2015-05-09 NOTE — ED Notes (Signed)
Delay in lab draw, pt not in room 

## 2015-05-09 NOTE — ED Notes (Signed)
Patient transported to CT 

## 2015-05-09 NOTE — H&P (Signed)
History and Physical:    Christine [name]   ZOX:[id number] DOB: 1955/08/20 DOA: 05/09/2015  Referring MD/provider: Dr. Vanita Panda. PCP: Lucretia Kern., DO   Chief Complaint: Mental status changes, expressive aphasia  History of Present Illness:   Christine [name] is an 59 y.o. female with a PMH of triple negative left breast cancer diagnosed 09/30/14 s/p neoadjuvant chemo following surgery followed by adjuvant XRT with concurrent Xeloda, with progression of disease noted 3/16 (biopsy proven lung metastasis) and brain mets discovered 8/16, status post whole brain radiation, who was brought to the ED today for evaluation of mental status changes.  The patient was noted to be at her usual baseline at around 0500 according to her husband.  At 12:00, the patient was talking but "not making sense".   The patient is unable to provide any additional history.  She recently completed therapy with decadron, but the patient/family was unsure as to the exact date.   ROS:   Review of Systems  Unable to perform ROS Patient unable to answer questions, has expressive aphasia.   Past Medical History:   Past Medical History  Diagnosis Date  . ALLERGIC RHINITIS   . GERD (gastroesophageal reflux disease)   . Hypertension   . Hypothyroidism   . Hx of colonic polyps   . Wears glasses   . Radiation 09/15/13-11/01/13    Left chest wall/PAB/scar/supraclavicular fossa  . SYNCOPE 05/15/2010    Qualifier: Diagnosis of  By: Arnoldo Morale MD, [place], WITH RADICULOPATHY 10/01/2007    Qualifier: Diagnosis of  By: Arnoldo Morale MD, [place] 05/31/2007    Qualifier: Diagnosis of  By: Arnoldo Morale MD, [place], HX OF 05/31/2007    Qualifier: Diagnosis of  By: Arnoldo Morale MD, [place]   . Lymph edema L arm from breast ca tx 12/22/2013  . H/O blood clots     hx of small blood clots in both legs  . Diabetes mellitus without complication ([place])     type 2 - on Metformin  . Anxiety   .  Neuropathy due to chemotherapeutic drug ([place])   . Breast cancer ([place]) 1988, age 58    right  . Breast cancer ([place]) 09/30/12    left, ER/PR -, Her 2 -  . Secondary lung cancer ([place]) 2016  . Anemia     during chemo  . History of IBS   . Glaucoma   . Dry skin   . Lymphedema of arm     left arm, post mastectomy  . Shortness of breath dyspnea     with exertion (since onset of pleural effusion) Wears O2 2 L as needed  . Chronic respiratory failure; On Home oxygen 2L since 12/03/2014 02/11/2015    Past Surgical History:   Past Surgical History  Procedure Laterality Date  . Tonsillectomy    . Caesarean section      x 1  . Mastectomy  04/1987    right side  . Abdominal hysterectomy  06/1988    fibroids  . Portacath placement Right 10/28/2012    Procedure: PORT PLACEMENT;  Surgeon: Joyice Faster. Cornett, MD;  Location: [facility];  Service: General;  Laterality: Right;  Right Subclavian Vein  . Mastectomy modified radical Left 07/19/2013    Procedure: MASTECTOMY MODIFIED RADICAL;  Surgeon: Marcello Moores A. Cornett, MD;  Location: [facility];  Service: General;  Laterality: Left;  . Port-a-cath  removal Right 07/19/2013    Procedure: REMOVAL PORT-A-CATH;  Surgeon: Joyice Faster. Cornett, MD;  Location: [facility];  Service: General;  Laterality: Right;  . Cesarean section    . Breast surgery Left 07/19/13    MRM  . Breast surgery Right     mastectomy  . Port-a-cath insertion  12/03/2014  . Colonoscopy    . Chest tube insertion Right 01/02/2015    Procedure: INSERTION PLEURAL DRAINAGE CATHETER RIGHT CHEST;  Surgeon: Gaye Pollack, MD;  Location: [facility];  Service: Thoracic;  Laterality: Right;  . Removal of pleural drainage catheter Right 01/16/2015    Procedure: REMOVAL OF PLEURAL DRAINAGE CATHETER;  Surgeon: Gaye Pollack, MD;  Location: [facility];  Service: Thoracic;  Laterality: Right;  . [place] pleurodesis Right 01/16/2015    Procedure: Pietro Cassis;  Surgeon:  Gaye Pollack, MD;  Location: [facility];  Service: Thoracic;  Laterality: Right;    Social History:   Social History   Social History  . Marital Status: Married    Spouse Name: Donnie  . Number of Children: 1  . Years of Education: N/A   Occupational History  . Unemployed    Social History Main Topics  . Smoking status: Never Smoker   . Smokeless tobacco: Never Used  . Alcohol Use: No  . Drug Use: No  . Sexual Activity: Not Currently     Comment: menarche 12, 1st pregnancy age 4-miscarr, age 2 1st live birth, menop 89   Other Topics Concern  . Not on file   Social History Narrative   Married. Lives w/ husband.  Has not worked since being diagnosed with cancer 2 years ago.  Previously worked as a Solicitor.  Independent with ADLs.      Spiritual Beliefs:Christian      Lifestyle: started the [employer] exercise program for cancer survivors (12/2013); working on diet             Family history:   Family History  Problem Relation Age of Onset  . Colon cancer Mother 23    family hx of colon ca 1st degree relative <60  . Hypertension Mother   . Coronary artery disease Father     family hx of CAD female 1st degree relative <50  . Stomach cancer Neg Hx   . Rectal cancer Neg Hx   . Esophageal cancer Neg Hx   . Breast cancer Maternal Grandmother 80  . Diabetes Paternal Grandmother   . Breast cancer Cousin 19    maternal cousin  . Coronary artery disease Maternal Grandfather   . Breast cancer Sister     paternal half sister; died in her 29s    Allergies   Sulfa antibiotics; Sulfonamide derivatives; and Tomato  Current Medications:   Prior to Admission medications   Medication Sig Start Date End Date Taking? Authorizing Provider  cetirizine (ZYRTEC) 10 MG tablet Take 10 mg by mouth daily as needed for allergies.     Historical Provider, MD  cholecalciferol (VITAMIN D) 1000 UNITS tablet Take 1,000 Units by mouth every morning.     Historical Provider, MD    diltiazem (CARDIZEM CD) 120 MG 24 hr capsule Take 1 capsule (120 mg total) by mouth every morning. 01/04/15   Lucretia Kern, DO  esomeprazole (NEXIUM) 20 MG capsule Take 20 mg by mouth daily as needed (acid).     Historical Provider, MD  fluticasone (FLONASE) 50 MCG/ACT nasal spray Place 2 sprays into both  nostrils daily as needed for allergies or rhinitis.    Historical Provider, MD  insulin glargine (LANTUS) 100 UNIT/ML injection Inject 0.08 mLs (8 Units total) into the skin at bedtime. 04/06/15   Belkys A Regalado, MD  KLOR-CON M20 20 MEQ tablet TAKE 1 TABLET (20 MEQ TOTAL) BY MOUTH 2 (TWO) TIMES DAILY. 04/30/15   Nicholas Lose, MD  KLOR-CON M20 20 MEQ tablet Take 1 tablet (20 mEq total) by mouth 2 (two) times daily. 04/30/15   Nicholas Lose, MD  latanoprost (XALATAN) 0.005 % ophthalmic solution Place 1 drop into both eyes at bedtime.     Historical Provider, MD  levothyroxine (SYNTHROID, LEVOTHROID) 50 MCG tablet Take 1 tablet (50 mcg total) by mouth daily. 11/07/14   Lucretia Kern, DO  lidocaine-prilocaine (EMLA) cream Apply to affected area once Patient taking differently: Apply 1 application topically as needed (for chemo). Apply to affected area once 11/16/14   Nicholas Lose, MD  LORazepam (ATIVAN) 0.5 MG tablet Take 1 tablet (0.5 mg total) by mouth every 6 (six) hours as needed (Nausea or vomiting). 02/28/15   Nicholas Lose, MD  metFORMIN (GLUCOPHAGE) 500 MG tablet Take 1000mg  (2 tabs) in the morning and 500mg  (1 tab) in the evening with meals 02/23/15   Lucretia Kern, DO  nystatin (MYCOSTATIN) 100000 UNIT/ML suspension Take 5 mLs (500,000 Units total) by mouth 2 (two) times daily. 05/02/15   Nicholas Lose, MD  ondansetron (ZOFRAN) 8 MG tablet Take 1 tablet (8 mg total) by mouth every 8 (eight) hours as needed for nausea or vomiting. 04/30/15   Nicholas Lose, MD  oxyCODONE (OXY IR/ROXICODONE) 5 MG immediate release tablet Take 1 tablet (5 mg total) by mouth every 4 (four) hours as needed for moderate pain. 04/06/15    Belkys A Regalado, MD  prochlorperazine (COMPAZINE) 10 MG tablet Take 1 tablet (10 mg total) by mouth every 6 (six) hours as needed (Nausea or vomiting). 11/16/14   Nicholas Lose, MD  Tetrahydrozoline HCl (VISINE OP) Apply 1-2 drops to eye daily as needed (itchy eyes.).    Historical Provider, MD  timolol (TIMOPTIC) 0.5 % ophthalmic solution Place 1 drop into both eyes 2 (two) times daily.    Historical Provider, MD    Physical Exam:   Filed Vitals:   05/09/15 1400 05/09/15 1445 05/09/15 1515 05/09/15 1525  BP: 123/79 131/80 139/85   Pulse: 112 110 110 112  Temp:   99.4 F (37.4 C) 99.3 F (37.4 C)  TempSrc:   Core (Comment)   Resp: 26 23 30 27   SpO2: 97% 94% 94%      Physical Exam: Blood pressure 139/85, pulse 112, temperature 99.3 F (37.4 C), temperature source Core (Comment), resp. rate 27, SpO2 94 %. Gen: No acute distress. Head: Normocephalic, atraumatic. Eyes: PERRL, EOMI, sclerae nonicteric. Mouth: Oropharynx clear with mild thrush. Neck: Supple, no thyromegaly, no lymphadenopathy, no jugular venous distention. Chest: Lungs CTAB. CV: Heart sounds are tachy/regular.  No M/R/G. Abdomen: Soft, nontender, nondistended with normal active bowel sounds. Extremities: Extremities are without C/E/C. Skin: Warm and dry. Neuro: Alert; cranial nerves II through XII grossly intact but unable to assess visual fields.  Does not consistently follow all commands.  Grip strength diminished but equal bilaterally.  Speaking incoherently, but clearly. Psych: Mood and affect normal.   Data Review:    Labs: Basic Metabolic Panel:  Recent Labs Lab 05/09/15 1343  NA 142  K 3.1*  CL 106  CO2 27  GLUCOSE 138*  BUN  11  CREATININE 0.47  CALCIUM 7.2*   Liver Function Tests:  Recent Labs Lab 05/09/15 1343  AST 22  ALT 21  ALKPHOS 70  BILITOT 0.8  PROT 6.3*  ALBUMIN 3.2*   CBC:  Recent Labs Lab 05/09/15 1343  WBC 6.3  NEUTROABS 4.9  HGB 10.6*  HCT 33.0*  MCV 100.0   PLT 44*   Cardiac Enzymes:  Recent Labs Lab 05/09/15 1343  TROPONINI <0.03   CBG:  Recent Labs Lab 05/09/15 1315 05/09/15 1332  GLUCAP 115* 129*    Radiographic Studies: Ct Head Wo Contrast  05/09/2015   CLINICAL DATA:  Altered mental status and expressive aphasia. Last seen normal 7 hours ago. Disorientation. History of breast cancer.  EXAM: CT HEAD WITHOUT CONTRAST  TECHNIQUE: Contiguous axial images were obtained from the base of the skull through the vertex without intravenous contrast.  COMPARISON:  04/03/2015  FINDINGS: The patient is known now widespread intracranial metastatic disease throughout the cerebral hemispheres and cerebellum. There are areas of low density within the cerebellum and cerebral hemispheres that are consistent with the known metastases. There are no new areas of low density. There is no evidence of vascular distribution infarction. No hemorrhage. No swelling, mass effect or shift. The calvarium is unremarkable. Sinuses, middle ears and mastoids are clear.  IMPRESSION: Areas of low-density affecting the cerebellum and cerebral hemispheres consistent with the known metastatic disease demonstrated on the MRI of 04/03/2015. This does not appear grossly progressive. No new lesions are seen. No evidence of vascular territory infarction, hemorrhage, mass effect or shift.   Electronically Signed   By: Nelson Chimes M.D.   On: 05/09/2015 13:49   Dg Chest Port 1 View  05/09/2015   CLINICAL DATA:  Altered mental status.  Aphasia.  Breast cancer.  EXAM: PORTABLE CHEST 1 VIEW  COMPARISON:  04/24/2015  FINDINGS: Power injectable right Port-A-Cath tip:  SVC.  Right pleural effusion. Moderate enlargement of the cardiopericardial silhouette. Indistinct pulmonary vasculature. Thoracic spondylosis.  IMPRESSION: 1. Moderate enlargement of the cardiopericardial silhouette with indistinct pulmonary vasculature compatible with pulmonary venous hypertension. 2. Stable right pleural  density favoring pleural effusion.   Electronically Signed   By: Van Clines M.D.   On: 05/09/2015 14:31   *I have personally reviewed the images above*  EKG: Independently reviewed. Sinus tachycardia at 120 bpm; Ventricular premature complex; Aberrant complex Nonspecific repol abnormality, diffuse leads; Prolonged QT interval; Baseline wander in lead(s) V4.  Assessment/Plan:   Principal Problem:   Altered mental state and expressive aphasia in the setting of breast cancer with brain metastasis - CT shows expected brain metastasis. - MRI ordered for further evaluation. - Given 10 mg decadron in ED, continue 4 mg Q 6 hours. - PT/OT/speech therapy evaluations requested.  Active Problems:   Abnormal EKG - Monitor on tele.    Hypothyroidism - Continue synthroid.    Allergic rhinitis - Continue Claritin, Flonase.    Breast cancer of upper-outer quadrant of left female breast ([place]) with lung and brain mets - Dr. Lindi Adie aware of admission and will see her tomorrow.    Vitamin D deficiency - Continue Vitamin D.    Tachycardia - Continue Cardizem.    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  No current indication for transfusion.    Diabetes mellitus (HCC)  - Continue Lantus 8 units SQ daily. Hold Metformin.  SSI, insulin sensitive scale Q 4 hours.  Thrush - Continue Nystatin.  Hypokalemia - Increase usual dose  of KCL to 20 mEq TID.  DVT prophylaxis - SCDs given thrombocytopenia.  Code Status: Full.  Discussed with family.  Husband is POA. Family Communication: Multiple family updated at bedside. Disposition Plan: Home when neurologically stable, likely 3 day hospital stay.  Time spent: 1 hour.  RAMA,CHRISTINA [facility] Pager [phone] Cell: [phone]   If 7PM-7AM, please contact night-coverage [email or web address] Password [email or web address] 05/09/2015, 4:29 PM

## 2015-05-09 NOTE — ED Notes (Signed)
MRI tech called and confirmed pt unable to have foley temp in place during MRI. rn will remove.

## 2015-05-09 NOTE — Progress Notes (Signed)
Called portable equipment and no seizure pads are available - all are in use.

## 2015-05-09 NOTE — ED Notes (Addendum)
Mad at bedside  Pt was trying to speak, not making comprehensible sentences, did manage to state "confused".

## 2015-05-09 NOTE — ED Notes (Signed)
hospitalist at bedside

## 2015-05-09 NOTE — ED Notes (Signed)
Primary nurse going to another assignment, pt still in MRI, nurse gave report to Manuela Schwartz, [place] on floor.

## 2015-05-09 NOTE — ED Notes (Signed)
Pt still in MRI, pt will be transported once pt returns

## 2015-05-09 NOTE — ED Notes (Signed)
hospitalist at bedside, pt not talking, rn went and got sister (with ringlets), rn told sister to say hello to pt while hospitalist doing assessment, once pt heard sister voice pt became much more alert began speaking in full sentences. Pt still has difficulty answering questions

## 2015-05-09 NOTE — ED Provider Notes (Signed)
CSN: [id number]     Arrival date & time 05/09/15  1308 History   First MD Initiated Contact with Patient 05/09/15 1323     Chief Complaint  Patient presents with  . Altered Mental Status     (Consider location/radiation/quality/duration/timing/severity/associated sxs/prior Treatment) HPI Patient presents with family members Friday history of present illness. Patient herself is perseverative, has expressive aphasia, is confused, level V caveat. Family states the patient is currently undergoing chemotherapy for breast cancer, brain metastases. Last known normal time was about 2.5 hours ago, when the patient's husband spoke with her on the phone. Subsequently, the patient was found to be confused, with difficulty speaking, behaving normally, performing typical activities of daily living without complication. No prior similar events. Family states that the patient has been receiving both chemotherapy, and radiation therapy. Last session was one week ago. No report of new fever, chills, other focal complaints.    Past Medical History  Diagnosis Date  . ALLERGIC RHINITIS   . GERD (gastroesophageal reflux disease)   . Hypertension   . Hypothyroidism   . Hx of colonic polyps   . Wears glasses   . Radiation 09/15/13-11/01/13    Left chest wall/PAB/scar/supraclavicular fossa  . SYNCOPE 05/15/2010    Qualifier: Diagnosis of  By: Arnoldo Morale MD, [place], WITH RADICULOPATHY 10/01/2007    Qualifier: Diagnosis of  By: Arnoldo Morale MD, [place] 05/31/2007    Qualifier: Diagnosis of  By: Arnoldo Morale MD, [place], HX OF 05/31/2007    Qualifier: Diagnosis of  By: Arnoldo Morale MD, Balinda Qu[place]   . Lymph edema L arm from breast ca tx 12/22/2013  . H/O blood clots     hx of small blood clots in both legs  . Diabetes mellitus without complication ([place])     type 2 - on Metformin  . Anxiety   . Neuropathy due to chemotherapeutic drug ([place])   . Breast cancer ([place]) 1988, age 89     right  . Breast cancer ([place]) 09/30/12    left, ER/PR -, Her 2 -  . Secondary lung cancer ([place]) 2016  . Anemia     during chemo  . History of IBS   . Glaucoma   . Dry skin   . Lymphedema of arm     left arm, post mastectomy  . Shortness of breath dyspnea     with exertion (since onset of pleural effusion) Wears O2 2 L as needed  . Chronic respiratory failure; On Home oxygen 2L since 12/03/2014 02/11/2015   Past Surgical History  Procedure Laterality Date  . Tonsillectomy    . Caesarean section      x 1  . Mastectomy  04/1987    right side  . Abdominal hysterectomy  06/1988    fibroids  . Portacath placement Right 10/28/2012    Procedure: PORT PLACEMENT;  Surgeon: Joyice Faster. Cornett, MD;  Location: [facility];  Service: General;  Laterality: Right;  Right Subclavian Vein  . Mastectomy modified radical Left 07/19/2013    Procedure: MASTECTOMY MODIFIED RADICAL;  Surgeon: Marcello Moores A. Cornett, MD;  Location: [facility];  Service: General;  Laterality: Left;  . Port-a-cath removal Right 07/19/2013    Procedure: REMOVAL PORT-A-CATH;  Surgeon: Joyice Faster. Cornett, MD;  Location: [facility];  Service: General;  Laterality: Right;  . Cesarean section    . Breast surgery Left 07/19/13  MRM  . Breast surgery Right     mastectomy  . Port-a-cath insertion  12/03/2014  . Colonoscopy    . Chest tube insertion Right 01/02/2015    Procedure: INSERTION PLEURAL DRAINAGE CATHETER RIGHT CHEST;  Surgeon: Gaye Pollack, MD;  Location: [facility] OR;  Service: Thoracic;  Laterality: Right;  . Removal of pleural drainage catheter Right 01/16/2015    Procedure: REMOVAL OF PLEURAL DRAINAGE CATHETER;  Surgeon: Gaye Pollack, MD;  Location: [facility];  Service: Thoracic;  Laterality: Right;  . [place] pleurodesis Right 01/16/2015    Procedure: Pietro Cassis;  Surgeon: Gaye Pollack, MD;  Location: [facility] OR;  Service: Thoracic;  Laterality: Right;   Family History  Problem  Relation Age of Onset  . Colon cancer Mother 86    family hx of colon ca 1st degree relative <60  . Hypertension Mother   . Coronary artery disease Father     family hx of CAD female 1st degree relative <50  . Stomach cancer Neg Hx   . Rectal cancer Neg Hx   . Esophageal cancer Neg Hx   . Breast cancer Maternal Grandmother 80  . Diabetes Paternal Grandmother   . Breast cancer Cousin 59    maternal cousin  . Coronary artery disease Maternal Grandfather   . Breast cancer Sister     paternal half sister; died in her 1s   Social History  Substance Use Topics  . Smoking status: Never Smoker   . Smokeless tobacco: Never Used  . Alcohol Use: No   OB History    Obstetric Comments   meanrche age 23, G28 , p 1, menopause [age], no HRT     Review of Systems  Unable to perform ROS: Mental status change      Allergies  Sulfa antibiotics; Sulfonamide derivatives; and Tomato  Home Medications   Prior to Admission medications   Medication Sig Start Date End Date Taking? Authorizing Provider  cetirizine (ZYRTEC) 10 MG tablet Take 10 mg by mouth daily as needed for allergies.     Historical Provider, MD  cholecalciferol (VITAMIN D) 1000 UNITS tablet Take 1,000 Units by mouth every morning.     Historical Provider, MD  diltiazem (CARDIZEM CD) 120 MG 24 hr capsule Take 1 capsule (120 mg total) by mouth every morning. 01/04/15   Lucretia Kern, DO  esomeprazole (NEXIUM) 20 MG capsule Take 20 mg by mouth daily as needed (acid).     Historical Provider, MD  fluticasone (FLONASE) 50 MCG/ACT nasal spray Place 2 sprays into both nostrils daily as needed for allergies or rhinitis.    Historical Provider, MD  insulin glargine (LANTUS) 100 UNIT/ML injection Inject 0.08 mLs (8 Units total) into the skin at bedtime. 04/06/15   Belkys A Regalado, MD  KLOR-CON M20 20 MEQ tablet TAKE 1 TABLET (20 MEQ TOTAL) BY MOUTH 2 (TWO) TIMES DAILY. 04/30/15   Nicholas Lose, MD  KLOR-CON M20 20 MEQ tablet Take 1 tablet (20  mEq total) by mouth 2 (two) times daily. 04/30/15   Nicholas Lose, MD  latanoprost (XALATAN) 0.005 % ophthalmic solution Place 1 drop into both eyes at bedtime.     Historical Provider, MD  levothyroxine (SYNTHROID, LEVOTHROID) 50 MCG tablet Take 1 tablet (50 mcg total) by mouth daily. 11/07/14   Lucretia Kern, DO  lidocaine-prilocaine (EMLA) cream Apply to affected area once Patient taking differently: Apply 1 application topically as needed (for chemo). Apply to affected area once 11/16/14  Nicholas Lose, MD  LORazepam (ATIVAN) 0.5 MG tablet Take 1 tablet (0.5 mg total) by mouth every 6 (six) hours as needed (Nausea or vomiting). 02/28/15   Nicholas Lose, MD  metFORMIN (GLUCOPHAGE) 500 MG tablet Take 1000mg  (2 tabs) in the morning and 500mg  (1 tab) in the evening with meals 02/23/15   Lucretia Kern, DO  nystatin (MYCOSTATIN) 100000 UNIT/ML suspension Take 5 mLs (500,000 Units total) by mouth 2 (two) times daily. 05/02/15   Nicholas Lose, MD  ondansetron (ZOFRAN) 8 MG tablet Take 1 tablet (8 mg total) by mouth every 8 (eight) hours as needed for nausea or vomiting. 04/30/15   Nicholas Lose, MD  oxyCODONE (OXY IR/ROXICODONE) 5 MG immediate release tablet Take 1 tablet (5 mg total) by mouth every 4 (four) hours as needed for moderate pain. 04/06/15   Belkys A Regalado, MD  prochlorperazine (COMPAZINE) 10 MG tablet Take 1 tablet (10 mg total) by mouth every 6 (six) hours as needed (Nausea or vomiting). 11/16/14   Nicholas Lose, MD  Tetrahydrozoline HCl (VISINE OP) Apply 1-2 drops to eye daily as needed (itchy eyes.).    Historical Provider, MD  timolol (TIMOPTIC) 0.5 % ophthalmic solution Place 1 drop into both eyes 2 (two) times daily.    Historical Provider, MD   BP 114/63 mmHg  Pulse 122  Temp(Src) 98.4 F (36.9 C) (Oral)  SpO2 95% Physical Exam  Constitutional: She appears listless.  Confused female resting in gurney, not following commands, speaking repetitively, with very brief, interacting  Pulmonary/Chest:   Bilateral mastectomy with scarring  Neurological: She appears listless.  Expressive and receptive aphasia. MAES, though not appropriately with commands.  Psychiatric: She is withdrawn. Cognition and memory are impaired.  Nursing note and vitals reviewed.   ED Course  Procedures (including critical care time) Labs Review Labs Reviewed  COMPREHENSIVE METABOLIC PANEL - Abnormal; Notable for the following:    Potassium 3.1 (*)    Glucose, Bld 138 (*)    Calcium 7.2 (*)    Total Protein 6.3 (*)    Albumin 3.2 (*)    All other components within normal limits  CBC WITH DIFFERENTIAL/PLATELET - Abnormal; Notable for the following:    RBC 3.30 (*)    Hemoglobin 10.6 (*)    HCT 33.0 (*)    RDW 18.5 (*)    Platelets 44 (*)    All other components within normal limits  CBG MONITORING, ED - Abnormal; Notable for the following:    Glucose-Capillary 129 (*)    All other components within normal limits  CBG MONITORING, ED - Abnormal; Notable for the following:    Glucose-Capillary 115 (*)    All other components within normal limits  APTT  TROPONIN I  PROTIME-INR  URINE RAPID DRUG SCREEN, HOSP PERFORMED  URINALYSIS, ROUTINE W REFLEX MICROSCOPIC (NOT AT [facility])  ETHANOL  I-STAT CG4 LACTIC ACID, ED    Imaging Review Ct Head Wo Contrast  05/09/2015   CLINICAL DATA:  Altered mental status and expressive aphasia. Last seen normal 7 hours ago. Disorientation. History of breast cancer.  EXAM: CT HEAD WITHOUT CONTRAST  TECHNIQUE: Contiguous axial images were obtained from the base of the skull through the vertex without intravenous contrast.  COMPARISON:  04/03/2015  FINDINGS: The patient is known now widespread intracranial metastatic disease throughout the cerebral hemispheres and cerebellum. There are areas of low density within the cerebellum and cerebral hemispheres that are consistent with the known metastases. There are no new areas of  low density. There is no evidence of vascular  distribution infarction. No hemorrhage. No swelling, mass effect or shift. The calvarium is unremarkable. Sinuses, middle ears and mastoids are clear.  IMPRESSION: Areas of low-density affecting the cerebellum and cerebral hemispheres consistent with the known metastatic disease demonstrated on the MRI of 04/03/2015. This does not appear grossly progressive. No new lesions are seen. No evidence of vascular territory infarction, hemorrhage, mass effect or shift.   Electronically Signed   By: Nelson Chimes M.D.   On: 05/09/2015 13:49   Dg Chest Port 1 View  05/09/2015   CLINICAL DATA:  Altered mental status.  Aphasia.  Breast cancer.  EXAM: PORTABLE CHEST 1 VIEW  COMPARISON:  04/24/2015  FINDINGS: Power injectable right Port-A-Cath tip:  SVC.  Right pleural effusion. Moderate enlargement of the cardiopericardial silhouette. Indistinct pulmonary vasculature. Thoracic spondylosis.  IMPRESSION: 1. Moderate enlargement of the cardiopericardial silhouette with indistinct pulmonary vasculature compatible with pulmonary venous hypertension. 2. Stable right pleural density favoring pleural effusion.   Electronically Signed   By: Van Clines M.D.   On: 05/09/2015 14:31   I have personally reviewed and evaluated these images and lab results as part of my medical decision-making.   EKG Interpretation   Date/Time:  Wednesday May 09 2015 13:28:21 EDT Ventricular Rate:  120 PR Interval:  106 QRS Duration: 78 QT Interval:  376 QTC Calculation: 531 R Axis:   -11 Text Interpretation:  Sinus tachycardia Ventricular premature complex  Aberrant complex Nonspecific repol abnormality, diffuse leads Prolonged QT  interval Baseline wander in lead(s) V4 Sinus tachycardia T wave  abnormality Abnormal ekg Confirmed by Carmin Muskrat  MD [phone]) on  05/09/2015 1:35:02 PM     Cardiac 110 sinus tach abnormal  3:13 PM She continued to be repetitive, with simple similar responses, intermixed with several  words. She is moving all extremities spontaneously. There appears to be mild facial droop. I reviewed the CT imaging myself, agree with the interpretation. We also reviewed old MRI imaging, with multiple brain metastases noted.  MDM  This elderly patient with breast cancer, currently receiving chemotherapy, radiation therapy now presents with altered mental status per Last normal time was seemingly about 3 hours prior to my evaluation, though with acknowledged history of brain metastases, she's not a candidate for TPA. Patient had waxing/waning improvement here, with CT evidence for persistent metastases, otherwise reassuring labs, she required admission for further evaluation and management. On admission the patient had MRI studies pending.   Carmin Muskrat, MD 05/09/15 1538

## 2015-05-09 NOTE — ED Notes (Signed)
Pt to MRI, rn to accompany, in case pt needs ativan, rn stayed in MRI 15 minutes, ativan was not needed during that time. MRI will call if pt needs ativan.

## 2015-05-10 DIAGNOSIS — R000 Tachycardia, unspecified: Secondary | ICD-10-CM

## 2015-05-10 DIAGNOSIS — J9621 Acute and chronic respiratory failure with hypoxia: Secondary | ICD-10-CM

## 2015-05-10 DIAGNOSIS — E119 Type 2 diabetes mellitus without complications: Secondary | ICD-10-CM

## 2015-05-10 DIAGNOSIS — D696 Thrombocytopenia, unspecified: Secondary | ICD-10-CM | POA: Diagnosis present

## 2015-05-10 DIAGNOSIS — C50412 Malignant neoplasm of upper-outer quadrant of left female breast: Secondary | ICD-10-CM

## 2015-05-10 DIAGNOSIS — E038 Other specified hypothyroidism: Secondary | ICD-10-CM

## 2015-05-10 DIAGNOSIS — I10 Essential (primary) hypertension: Secondary | ICD-10-CM

## 2015-05-10 DIAGNOSIS — D638 Anemia in other chronic diseases classified elsewhere: Secondary | ICD-10-CM

## 2015-05-10 DIAGNOSIS — C7800 Secondary malignant neoplasm of unspecified lung: Secondary | ICD-10-CM

## 2015-05-10 DIAGNOSIS — R4701 Aphasia: Secondary | ICD-10-CM

## 2015-05-10 DIAGNOSIS — E876 Hypokalemia: Secondary | ICD-10-CM

## 2015-05-10 DIAGNOSIS — J90 Pleural effusion, not elsewhere classified: Secondary | ICD-10-CM

## 2015-05-10 DIAGNOSIS — C7931 Secondary malignant neoplasm of brain: Principal | ICD-10-CM

## 2015-05-10 DIAGNOSIS — G9340 Encephalopathy, unspecified: Secondary | ICD-10-CM

## 2015-05-10 DIAGNOSIS — Z515 Encounter for palliative care: Secondary | ICD-10-CM | POA: Insufficient documentation

## 2015-05-10 LAB — GLUCOSE, CAPILLARY
GLUCOSE-CAPILLARY: 159 mg/dL — AB (ref 65–99)
GLUCOSE-CAPILLARY: 125 mg/dL — AB (ref 65–99)
Glucose-Capillary: 160 mg/dL — ABNORMAL HIGH (ref 65–99)
Glucose-Capillary: 161 mg/dL — ABNORMAL HIGH (ref 65–99)
Glucose-Capillary: 184 mg/dL — ABNORMAL HIGH (ref 65–99)
Glucose-Capillary: 186 mg/dL — ABNORMAL HIGH (ref 65–99)
Glucose-Capillary: 236 mg/dL — ABNORMAL HIGH (ref 65–99)
Glucose-Capillary: 269 mg/dL — ABNORMAL HIGH (ref 65–99)

## 2015-05-10 LAB — BASIC METABOLIC PANEL
Anion gap: 10 (ref 5–15)
BUN: 11 mg/dL (ref 6–20)
CALCIUM: 6.7 mg/dL — AB (ref 8.9–10.3)
CO2: 26 mmol/L (ref 22–32)
CREATININE: 0.52 mg/dL (ref 0.44–1.00)
Chloride: 106 mmol/L (ref 101–111)
GFR calc Af Amer: >60 mL/min (ref >60)
GLUCOSE: 208 mg/dL — AB (ref 65–99)
Potassium: 3.3 mmol/L — ABNORMAL LOW (ref 3.5–5.1)
Sodium: 142 mmol/L (ref 135–145)

## 2015-05-10 MED ORDER — POTASSIUM CHLORIDE CRYS ER 20 MEQ PO TBCR
40.0000 meq | EXTENDED_RELEASE_TABLET | Freq: Once | ORAL | Status: AC
  Administered 2015-05-10: 40 meq via ORAL
  Filled 2015-05-10: qty 2

## 2015-05-10 MED ORDER — SODIUM CHLORIDE 0.9 % IJ SOLN
10.0000 mL | INTRAMUSCULAR | Status: DC | PRN
  Administered 2015-05-11: 10 mL
  Filled 2015-05-10: qty 40

## 2015-05-10 NOTE — Evaluation (Signed)
Physical Therapy Evaluation Patient Details Name: Christine [name] MRN: [id number] DOB: 1955-10-17 Today's Date: 05/10/2015   History of Present Illness  Christine [name] is an 59 y.o. female with a PMH of triple negative left breast cancer diagnosed 09/30/14 s/p neoadjuvant chemo following surgery followed by adjuvant XRT with concurrent Xeloda, with progression of disease noted 3/16 (biopsy proven lung metastasis) and brain mets discovered 8/16, status post whole brain radiation, who was brought to the ED today for evaluation of mental status changes.   Clinical Impression  Pt admitted with above diagnosis. Pt currently with functional limitations due to the deficits listed below (see PT Problem List).   Pt will benefit from skilled PT to increase their independence and safety with mobility to allow discharge to the venue listed below.   Pt presents mostly with cognitive deficits.  Pt with increased difficulty word finding.  Pt typically wears bifocals (states poor vision both near and far) which were not in room.  Pt close to room number sign and able to read 1 however not able to finish reading 437, which seemed more likely from cognition issues then vision.  Recommend at least supervision for safety upon d/c due to cognitive status.     Follow Up Recommendations Home health PT;Supervision for mobility/OOB    Equipment Recommendations  None recommended by PT    Recommendations for Other Services       Precautions / Restrictions Precautions Precautions: Fall Restrictions Weight Bearing Restrictions: No      Mobility  Bed Mobility Overal bed mobility: Needs Assistance Bed Mobility: Supine to Sit     Supine to sit: Min guard     General bed mobility comments: min guard for safety as pt moves quickly once provided with cues to initiate  Transfers Overall transfer level: Needs assistance Equipment used: None Transfers: Sit to/from Stand Sit to Stand: Min assist          General transfer comment: min assist especially for stand to sit due to being unsteady and "plopping back" onto EOB to sit.   Ambulation/Gait Ambulation/Gait assistance: Min guard Ambulation Distance (Feet): 200 Feet Assistive device: None Gait Pattern/deviations: Step-through pattern;Decreased stride length;Drifts right/left     General Gait Details: mildly unsteady gait however no LOB observed, SpO2 90-96% on room air during gait  (returned to room and placed back on [place] however set at 1L vs 3L, RN aware)  Stairs            Wheelchair Mobility    Modified Rankin (Stroke Patients Only)       Balance Overall balance assessment: Needs assistance   Sitting balance-Leahy Scale: Good     Standing balance support: No upper extremity supported Standing balance-Leahy Scale: Fair                               Pertinent Vitals/Pain Pain Assessment: No/denies pain    Home Living Family/patient expects to be discharged to:: Private residence Living Arrangements: Spouse/significant other Available Help at Discharge: Family;Available PRN/intermittently           Home Equipment: None      Prior Function           Comments: pt's family members (aunt and one other) stepped out during session however mentioned pt's cognition declining over last month, pt lives with spouse and daughter has been assisting intermittently     Hand Dominance  Extremity/Trunk Assessment   Upper Extremity Assessment: Generalized weakness           Lower Extremity Assessment: Generalized weakness (grossly at least 3/5 throughout per observation)         Communication   Communication: Expressive difficulties  Cognition Arousal/Alertness: Awake/alert Behavior During Therapy: WFL for tasks assessed/performed Overall Cognitive Status: Impaired/Different from baseline Area of Impairment: Safety/judgement;Orientation Orientation Level: Disoriented  to;Place;Time       Safety/Judgement: Decreased awareness of deficits;Decreased awareness of safety     General Comments: pt with difficulty stating last name, unable to state date however perseverating on Thurday, not orientated to place (reorientated) however able to recall [place] at end of session, pt with difficulty word finding and would perform parroting if provided with answer    General Comments      Exercises        Assessment/Plan    PT Assessment Patient needs continued PT services  PT Diagnosis Difficulty walking;Altered mental status   PT Problem List Decreased activity tolerance;Decreased mobility;Decreased cognition;Decreased safety awareness  PT Treatment Interventions DME instruction;Gait training;Functional mobility training;Patient/family education;Therapeutic activities;Therapeutic exercise   PT Goals (Current goals can be found in the Care Plan section) Acute Rehab PT Goals PT Goal Formulation: With patient Time For Goal Achievement: 05/24/15 Potential to Achieve Goals: Fair    Frequency Min 3X/week   Barriers to discharge        Co-evaluation PT/OT/SLP Co-Evaluation/Treatment: Yes Reason for Co-Treatment: For patient/therapist safety PT goals addressed during session: Mobility/safety with mobility OT goals addressed during session: ADL's and self-care       End of Session Equipment Utilized During Treatment: Gait belt Activity Tolerance: Patient tolerated treatment well Patient left: in bed;with call bell/phone within reach;with bed alarm set Nurse Communication: Mobility status         Time: 8127-5170 PT Time Calculation (min) (ACUTE ONLY): 22 min   Charges:   PT Evaluation $Initial PT Evaluation Tier I: 1 Procedure     PT G Codes:        Christine [name],Christine [name] 05/10/2015, 1:38 PM Carmelia Bake, PT, DPT 05/10/2015 Pager: [phone]

## 2015-05-10 NOTE — Evaluation (Addendum)
Occupational Therapy Evaluation Patient Details Name: Christine [name] MRN: [id number] DOB: 1956/02/24 Today's Date: 05/10/2015    History of Present Illness Christine [name] is an 59 y.o. female with a PMH of triple negative left breast cancer diagnosed 09/30/14 s/p neoadjuvant chemo following surgery followed by adjuvant XRT with concurrent Xeloda, with progression of disease noted 3/16 (biopsy proven lung metastasis) and brain mets discovered 8/16, status post whole brain radiation, who was brought to the ED today for evaluation of mental status changes.    Clinical Impression   Pt with functional mobility at min guard to min assist level. She has word finding/expressive difficulties and tended to perseverate on certain topics. She would benefit from skilled OT to increase her independence and safety with self care tasks for d/c. Recommend 24/7 supervision/assist for safety due to cognitive deficits.    Follow Up Recommendations  No OT follow up;Supervision/Assistance - 24 hour    Equipment Recommendations  None recommended by OT    Recommendations for Other Services       Precautions / Restrictions Precautions Precautions: Fall Restrictions Weight Bearing Restrictions: No      Mobility Bed Mobility Overal bed mobility: Needs Assistance Bed Mobility: Supine to Sit     Supine to sit: Min guard     General bed mobility comments: min guard for safety as pt moves quickly once provided with cues to initiate  Transfers Overall transfer level: Needs assistance Equipment used: None Transfers: Sit to/from Stand Sit to Stand: Min assist         General transfer comment: min assist especially for stand to sit due to being unsteady and "plopping back" onto EOB to sit.     Balance Overall balance assessment: Needs assistance   Sitting balance-Leahy Scale: Good     Standing balance support: No upper extremity supported Standing balance-Leahy Scale: Fair                              ADL Overall ADL's : Needs assistance/impaired   Eating/Feeding Details (indicate cue type and reason): not tested. Grooming: Wash/dry face;Min guard;Standing   Upper Body Bathing: Set up;Supervision/ safety;Sitting   Lower Body Bathing: Minimal assistance;Sit to/from stand   Upper Body Dressing : Minimal assistance;Sitting Upper Body Dressing Details (indicate cue type and reason): with IV and gown. Lower Body Dressing: Minimal assistance;Sit to/from stand   Toilet Transfer: Minimal assistance;Ambulation   Toileting- Clothing Manipulation and Hygiene: Minimal assistance;Sit to/from stand         General ADL Comments: Pt reports wearing glasses most of the time and observed pt to blink eyes repeatedly at times during session and observed her eyes watering also. pt states she is hoping husband will bring her glasses but she had difficulty with word finding for "glasses" herself. She was unable to state the current month, date, year and even when directed to use the calendar on the wall, she seemed to have difficulty even locating/focusing on calendar even with therapist standing by and pointing to calendar. Pt stated her first name but had difficulty stating her last name and unable to state [facility] or hospital as location. When OT explained she is at [facility] however, pt able to recall the name of the hospital approximately 10 minutes later. Pt with one LOB stepping back from the sink to the bed and "plopped" back onto bed with min assist to try to guide her down.  She tends  to perseverate on certain topics at times also. When shown her room number in the hallway, pt only able to read the number "1" of her room number but had much difficulty with the other numbers and finally stated, "I cant do this right now." This appeared to be more cognitively related.     Vision Additional Comments: pt states she wears glasses most of the time. she is hoping husband will  bring them. pt unable to read anything other than the "1" in her room number beside the door. Difficulty focusing on task and appears too much information is overwhelming.    Perception     Praxis      Pertinent Vitals/Pain Pain Assessment: No/denies pain     Hand Dominance     Extremity/Trunk Assessment Upper Extremity Assessment Upper Extremity Assessment: Generalized weakness          Communication Communication Communication: Expressive difficulties   Cognition Arousal/Alertness: Awake/alert Behavior During Therapy: WFL for tasks assessed/performed Overall Cognitive Status: Impaired/Different from baseline Area of Impairment: Safety/judgement;Orientation Orientation Level: Place;Time       Safety/Judgement: Decreased awareness of safety;Decreased awareness of deficits     General Comments: pt with word finding difficulty. Unable to state date so attempted to have pt utilize calendar but unable. Pt perseverated on Thursday. Not oriented to location. Reoriented pt to [facility] and pt able to recall  [place] at the end of the session.    General Comments       Exercises       Shoulder Instructions      Home Living Family/patient expects to be discharged to:: Private residence Living Arrangements: Spouse/significant other Available Help at Discharge: Family;Available PRN/intermittently                         Home Equipment: None          Prior Functioning/Environment          Comments: pt's family members (aunt and one other) stepped out during session however mentioned pt's cognition declining over last month, pt lives with spouse and daughter has been assisting intermittently    OT Diagnosis: Generalized weakness   OT Problem List: Decreased strength;Decreased knowledge of use of DME or AE;Decreased cognition   OT Treatment/Interventions: Self-care/ADL training;Patient/family education;DME and/or AE instruction;Cognitive  remediation/compensation    OT Goals(Current goals can be found in the care plan section) Acute Rehab OT Goals Patient Stated Goal: pt agreeable to walk. OT Goal Formulation: With patient Time For Goal Achievement: 05/24/15 Potential to Achieve Goals: Good  OT Frequency: Min 2X/week   Barriers to D/C:            Co-evaluation PT/OT/SLP Co-Evaluation/Treatment: Yes Reason for Co-Treatment: For patient/therapist safety PT goals addressed during session: Mobility/safety with mobility OT goals addressed during session: ADL's and self-care      End of Session Equipment Utilized During Treatment: Gait belt  Activity Tolerance: Patient tolerated treatment well Patient left: in bed;with call bell/phone within reach   Time: 1142-1206 OT Time Calculation (min): 24 min Charges:  OT General Charges $OT Visit: 1 Procedure OT Evaluation $Initial OT Evaluation Tier I: 1 Procedure G-Codes:    Jules Schick  [phone] 05/10/2015, 1:48 PM

## 2015-05-10 NOTE — Progress Notes (Signed)
SLP Cancellation Note  Patient Details Name: Christine [name] MRN: [id number] DOB: June 18, 1956   Cancelled treatment:         RN reports MD meant to discontinue order this am. Sent MD text to clarify.   Luanna Salk, [place] [facility] SLP [phone]  Macario Golds 05/10/2015, 11:23 AM

## 2015-05-10 NOTE — Progress Notes (Signed)
Initial Nutrition Assessment  DOCUMENTATION CODES:   Not applicable  INTERVENTION:  - RD will continue to monitor for needs  NUTRITION DIAGNOSIS:   Increased nutrient needs related to catabolic illness, cancer and cancer related treatments as evidenced by estimated needs.  GOAL:   Patient will meet greater than or equal to 90% of their needs  MONITOR:   PO intake, Weight trends, Labs, Skin, I & O's  REASON FOR ASSESSMENT:   Consult Assessment of nutrition requirement/status  ASSESSMENT:   59 y.o. female with a PMH of triple negative left breast cancer diagnosed 09/30/14 s/p neoadjuvant chemo following surgery followed by adjuvant XRT with concurrent Xeloda, with progression of disease noted 3/16 (biopsy proven lung metastasis) and brain mets discovered 8/16, status post whole brain radiation, who was brought to the ED today for evaluation of mental status changes. The patient was noted to be at her usual baseline at around 0500 according to her husband. At 12:00, the patient was talking but "not making sense". The patient is unable to provide any additional history. She recently completed therapy with decadron, but the patient/family was unsure as to the exact date.  Pt seen for consult. BMI indicates overweight status. Pt has been NPO since admission with diet recently advanced; visitor was ordering lunch for pt at time of RD visit. Pt is no longer confused, able to talk in full sentences. She reports that she is very hungry today and looking forward to lunch. She states that PTA she had a very good appetite as well despite undergoing chemotherapy. She denies any taste alterations associated with treatment and denies ever feeling nauseated or vomiting after chemo treatments.   Pt states that her weight has been stable recently and she has been trying to watch what she eats in an effort to not gain weight. Per chart review, pt's weight has been fluctuating (165-179 lbs) in the  past 5-6 weeks; will continue to monitor. No muscle or fat wasting noted.   Unable to meet needs currently. Will continue to monitor for needs. Medications reviewed. Labs reviewed; CBGs: 115-161 mg/dL, K: 3.1 mmol/L, Ca: 7.2 mg/dL.   Diet Order:  Diet regular Room service appropriate?: Yes; Fluid consistency:: Thin  Skin:  Reviewed, no issues  Last BM:  10/5  Height:   Ht Readings from Last 1 Encounters:  05/09/15 5\' 5"  (1.651 m)    Weight:   Wt Readings from Last 1 Encounters:  05/09/15 165 lb 12.6 oz (75.2 kg)    Ideal Body Weight:  56.82 kg (kg)  BMI:  Body mass index is 27.59 kg/(m^2).  Estimated Nutritional Needs:   Kcal:  2250-2500  Protein:  85-95 grams  Fluid:  2 L/day  EDUCATION NEEDS:   No education needs identified at this time     Jarome Matin, RD, LDN Inpatient Clinical Dietitian Pager # [phone] After hours/weekend pager # [phone]

## 2015-05-10 NOTE — Progress Notes (Signed)
HEMATOLOGY-ONCOLOGY PROGRESS NOTE  SUBJECTIVE: Moderate improvement in mental status. Patient is able to talk in full sentences. Her husband reports that she has improved tremendously from yesterday. She denies any headaches today.  OBJECTIVE: PHYSICAL EXAMINATION: ECOG PERFORMANCE STATUS: 3 - Symptomatic, >50% confined to bed  Filed Vitals:   05/10/15 1337  BP: 111/71  Pulse: 98  Temp: 98.1 F (36.7 C)  Resp: 18   Filed Weights   05/09/15 1803  Weight: 165 lb 12.6 oz (75.2 kg)    GENERAL:alert, SKIN: skin color, texture, turgor are normal, no rashes or significant lesions EYES: normal, Conjunctiva are pink and non-injected, sclera clear OROPHARYNX:no exudate, no erythema and lips, buccal mucosa, and tongue normal  NECK: supple, thyroid normal size, non-tender, without nodularity LYMPH:  no palpable lymphadenopathy in the cervical, axillary or inguinal LUNGS: clear to auscultation and percussion with normal breathing effort HEART: regular rate & rhythm and no murmurs and no lower extremity edema ABDOMEN:abdomen soft, non-tender and normal bowel sounds Musculoskeletal:no cyanosis of digits and no clubbing  NEURO: alert & oriented x 3 with fluent speech, no focal motor/sensory deficits  LABORATORY DATA:  I have reviewed the data as listed CMP Latest Ref Rng 05/10/2015 05/09/2015 05/02/2015  Glucose 65 - 99 mg/dL 208(H) 138(H) 122  BUN 6 - 20 mg/dL 11 11 20.1  Creatinine 0.44 - 1.00 mg/dL 0.52 0.47 0.8  Sodium 135 - 145 mmol/L 142 142 140  Potassium 3.5 - 5.1 mmol/L 3.3(L) 3.1(L) 3.4(L)  Chloride 101 - 111 mmol/L 106 106 -  CO2 22 - 32 mmol/L 26 27 25   Calcium 8.9 - 10.3 mg/dL 6.7(L) 7.2(L) 8.8  Total Protein 6.5 - 8.1 g/dL - 6.3(L) 5.4(L)  Total Bilirubin 0.3 - 1.2 mg/dL - 0.8 0.59  Alkaline Phos 38 - 126 U/L - 70 67  AST 15 - 41 U/L - 22 21  ALT 14 - 54 U/L - 21 23    Lab Results  Component Value Date   WBC 6.3 05/09/2015   HGB 10.6* 05/09/2015   HCT 33.0*  05/09/2015   MCV 100.0 05/09/2015   PLT 44* 05/09/2015   NEUTROABS 4.9 05/09/2015    ASSESSMENT AND PLAN: 1. Diffuse brain metastases: Previous palliative radiation was given. MRI of the brain and CT of the head were reviewed. I showed the images to the patient and her family. It appears that there are several new lesions as well as progression of additional lesions. There is cerebral edema accompanied by these brain metastases. Patient appears to have had marked improvement in mental status with the starting of dexamethasone. I will discuss with Dr. Pablo Ledger to see if there are any other options for treatment of the brain.  2. Metastatic breast cancer with brain metastases: Patient is responding in her lungs but progressing in the brain to [place]. Since radiation was given, she developed thrombocytopenia. We had to reduce the dosage of chemotherapy. I do not think that is the reason for her progression of brain metastases. I believe the chemotherapy is not able to penetrate the blood brain barrier.  3. Prognosis: I discussed with the patient that this suggests a very poor prognosis. Patients with extensive brain metastases tend to have very poor survival. I instructed the patient and her family to discuss how they would like to be treated towards end of life. I believe that continued chemotherapy may not offer any significant advantage to hospice and palliative care. This is because she is most likely to die from brain  metastases rather than her systemic disease in the lungs.  4. I will discuss with Dr. Pablo Ledger and will meet her tomorrow to discuss any other treatment options may be available. Other potential options include immunotherapy which is still in clinical trials.  With her profound thrombocytopenia, I do not believe she can tolerate any more chemotherapy.  5. We will need to consult palliative care. 6. Patient wishes to be DO NOT RESUSCITATE CC I will meet her tomorrow afternoon  and discuss this further.

## 2015-05-10 NOTE — Progress Notes (Addendum)
Patient ID: Christine [name], female   DOB: March 03, 1956, 59 y.o.   MRN: [id number]  [facility] PROGRESS NOTE  Christine [name] PJS:[id number] DOB: 12/10/1955 DOA: 05/09/2015 PCP: Lucretia Kern., DO   Brief narrative:    59 y.o. female with a PMH of triple negative left breast cancer diagnosed 09/30/14 s/p neoadjuvant chemo following surgery followed by adjuvant XRT with concurrent Xeloda, with progression of disease noted 3/16 (biopsy proven lung metastasis) and brain mets discovered 8/16, s/p whole brain radiation, who was brought to the ED for evaluation of mental status changes. The patient was noted to be at her usual baseline at around 0500 on the day of the admission, according to her husband. At 12:00, the patient was talking but "not making sense".  Assessment/Plan:    Principal Problem:   Acute encephalopathy - possibly related to progression on brain metastasis in the setting of known metastatic breast cancer - mental status clear this AM, pt wants to eat - OK to advance diet as pt able to tolerate - continue Decadron and follow up on oncologist recommendations  Active Problems:   Brain metastases ([place]) - mixed response to therapy based in MRI brain - to be discussed with pt's oncologist Dr. Lindi Adie     Expressive aphasia - ? Related to the above - resolved - advance diet as pt able to tolerate    Thrombocytopenia ([place]), chemotherapy induced - no signs of active bleeding - keep on SCD's for DVT prophylaxis  - CBC in AM - no indication for transfusion at this time     Acute on chronic respiratory failure with hypoxia ([facility]) - oxygen saturation still 84% on RA requiring oxygen supplementation via [place] - this is likely secondary to right plural effusion and lung metastasis  - monitor respiratory status closely  - avoid IVF for now  - will need to reassess oxygen needs prior to discharge, asked to check oxygen level with ambulation     Recurrent pleural effusion on  right in the setting of lung metastasis  - monitor as noted above and keep on oxygen for now    Breast cancer of upper-outer quadrant of left female breast ([place]) - Dr. Lindi Adie notified of pt's admission - will follow up on recommendations     Lung metastasis ([place]) - currently requiring oxygen due to hypoxia  - monitor     Hypokalemia - supplement and repeat BMP in AM    Tachycardia secondary to metastatic breast cancer - no chest pain this AM - monitor on tele for now  - continue Cardizem per home medical regimen     Hypothyroidism - continue synthroid per home medical regimen     Essential hypertension - reasonable inpatient control     Anemia of chronic disease, malignancy - no sings of active bleeding - CBC in AM    Diabetes mellitus, type II ([place]) - continue SSI and Lantus 8 U QHS  DVT prophylaxis - SCD's  Code Status: Full.  Family Communication:  plan of care discussed with the patient, husband and sister at bedside  Disposition Plan: Home in 1-2 days   IV access:  Peripheral IV  Procedures and diagnostic studies:    Ct Head Wo Contrast 05/09/2015   Areas of low-density affecting the cerebellum and cerebral hemispheres consistent with the known metastatic disease demonstrated on the MRI of 04/03/2015. This does not appear grossly progressive. No new lesions are seen. No evidence of vascular territory infarction, hemorrhage, mass effect or  shift.     Mr [name] Wo Contrast 05/09/2015  Mixed response to therapy with decrease in the majority of lesions, but several lesions have increased in size. 2. Increased size of lesions in the high anterior right frontal lobe, inferior left frontal lobe lateral, and bilateral cerebellum. 3. New lesions in the anterior inferior right frontal lobe and lateral right temporal lobe.     Nm Pet Image Restag (ps) Skull Base To Thigh 04/24/2015  Significant response to interval therapy. The bilateral pulmonary nodules have all decreased  in size and metabolic activity. 2. Near-complete resolution of large malignant right pleural effusion following presumed pleurodesis. There is mild residual hypermetabolic activity within the right pleural space. 3. No evidence of chest wall recurrence or extra thoracic metastatic disease.  Dg Chest Port 1 View 05/09/2015  Moderate enlargement of the cardiopericardial silhouette with indistinct pulmonary vasculature compatible with pulmonary venous hypertension. 2. Stable right pleural density favoring pleural effusion  Medical Consultants:  Oncology Dr. Lindi Adie  PCT  Other Consultants:  PT/OT - [facility] PT recommended, orders placed   IAnti-Infectives:   None   Faye Ramsay, MD  [facility] Pager [phone]  If 7PM-7AM, please contact night-coverage [email or web address] Password [email or web address] 05/10/2015, 1:39 PM   LOS: 1 day   HPI/Subjective: No events overnight. Feels better and wants to eat this AM.   Objective: Filed Vitals:   05/09/15 2025 05/10/15 0444 05/10/15 1057 05/10/15 1337  BP: 119/75 117/74 110/77 111/71  Pulse: 94 98 102 98  Temp: 98.4 F (36.9 C) 98.1 F (36.7 C) 98 F (36.7 C) 98.1 F (36.7 C)  TempSrc: Oral Oral Oral Oral  Resp: 18 18 18 18   Height:      Weight:      SpO2: 98% 95% 99% 98%    Intake/Output Summary (Last 24 hours) at 05/10/15 1339 Last data filed at 05/10/15 1300  Gross per 24 hour  Intake 1879.16 ml  Output    150 ml  Net 1729.16 ml    Exam:   General:  Pt is alert, follows commands appropriately, not in acute distress  Cardiovascular: Regular rate, tachycardic, S1/S2, no murmurs, no rubs, no gallops  Respiratory: Clear to auscultation bilaterally, diminished breath sounds on the right side   Abdomen: Soft, non tender, non distended, bowel sounds present, no guarding  Extremities: pulses DP and PT palpable bilaterally  Neuro: Grossly nonfocal  Data Reviewed: Basic Metabolic Panel:  Recent Labs Lab 05/09/15 1343 05/10/15 1025  NA 142  142  K 3.1* 3.3*  CL 106 106  CO2 27 26  GLUCOSE 138* 208*  BUN 11 11  CREATININE 0.47 0.52  CALCIUM 7.2* 6.7*   Liver Function Tests:  Recent Labs Lab 05/09/15 1343  AST 22  ALT 21  ALKPHOS 70  BILITOT 0.8  PROT 6.3*  ALBUMIN 3.2*   CBC:  Recent Labs Lab 05/09/15 1343  WBC 6.3  NEUTROABS 4.9  HGB 10.6*  HCT 33.0*  MCV 100.0  PLT 44*   Cardiac Enzymes:  Recent Labs Lab 05/09/15 1343  TROPONINI <0.03  CBG:  Recent Labs Lab 05/09/15 2021 05/10/15 0025 05/10/15 0440 05/10/15 0741 05/10/15 1155  GLUCAP 160* 161* 159* 125* 186*   Scheduled Meds: . cholecalciferol  1,000 Units Oral q morning - 10a  . dexamethasone  4 mg Intravenous 4 times per day  . diltiazem  120 mg Oral q morning - 10a  . insulin aspart  0-9 Units Subcutaneous 6 times per day  . insulin  glargine  8 Units Subcutaneous QHS  . latanoprost  1 drop Both Eyes QHS  . levothyroxine  50 mcg Oral Q breakfast  . loratadine  10 mg Oral Daily  . LORazepam  1 mg Intravenous Once  . nystatin  5 mL Oral BID  . timolol  1 drop Both Eyes BID   Continuous Infusions: . sodium chloride 100 mL/hr (05/09/15 1432)  . sodium chloride 50 mL/hr at 05/10/15 0200

## 2015-05-10 NOTE — Consult Note (Signed)
Consultation Note Date: 05/10/2015   Patient Name: Christine [name]  DOB: Apr 15, 1956  MRN: [id number]  Age / Sex: 59 y.o., female   PCP: Christine Kern, DO Referring Physician: Theodis Blaze, MD  Reason for Consultation: Establishing goals of care  Palliative Care Assessment and Plan Summary of Established Goals of Care and Medical Treatment Preferences   History of presenting illness: Christine [name] is an 59 y.o. female with a PMH of triple negative left breast cancer diagnosed 09/30/14 s/p neoadjuvant chemo following surgery followed by adjuvant XRT with concurrent Xeloda, with progression of disease noted 3/16 (biopsy proven lung metastasis) and brain mets discovered 8/16, status post whole brain radiation, who was brought to the ED for evaluation of mental status changes. MRI of the brain obtained shows mixed response to the radiation. Consultation with Dr Christine [name] is pending. Palliative consult has been requested.   The patient is confused at times, she is pleasant, awake, alert. Her sister, sister in law and niece are present at the bedside. The patient was noticed to have been confused at home when her sister went to drop off some food. There is no reported seizure type activity.   Patient's husband is not at the bedside. Will follow up with patient and her husband after dr Christine [name] input. Thank you for the consultation. The patient denies any complaints, she has no pain, no nausea, no constipation, no dyspnea.            Patient states that she remains hopeful about her condition, that she has an excellent source of support in her husband, siblings and in laws.    Contacts/Participants in Discussion: Primary Decision Maker: patient, husband.   HCPOA: yes     Code Status/Advance Care Planning: Full code for now.  Symptom Management:   Continue current treatment. Agree with decadron. No seizure type activity noted. If noted, then will benefit from seizure prophylaxis   Palliative  Prophylaxis: yes  Additional Recommendations (Limitations, Scope, Preferences):  Continue to engage in discussions with regards to code status and advanced care planning.  Psycho-social/Spiritual:   Support System: strong, husband, siblings and in laws.   Desire for further Chaplaincy support:no  Prognosis: Unable to determine  Discharge Planning:  Home with Home Health    Values: continue current  Mode of care Life limiting illness: breast cancer, metastatic.      Chief Complaint/History of Present Illness: encephalopathy   Primary Diagnoses  Present on Admission:  . Expressive aphasia . Breast cancer of upper-outer quadrant of left female breast ([place]) . Brain metastases ([place]) . Hypothyroidism . Tachycardia . Acute encephalopathy . Anemia of chronic disease . Hypokalemia . Lung metastasis ([place]) . Thrombocytopenia ([place]) . Essential hypertension . Acute on chronic respiratory failure with hypoxia ([place]) . Recurrent pleural effusion on right  Palliative Review of Systems: Completed   I have reviewed the medical record, interviewed the patient and family, and examined the patient. The following aspects are pertinent.  Past Medical History  Diagnosis Date  . ALLERGIC RHINITIS   . GERD (gastroesophageal reflux disease)   . Hypertension   . Hypothyroidism   . Hx of colonic polyps   . Wears glasses   . Radiation 09/15/13-11/01/13    Left chest wall/PAB/scar/supraclavicular fossa  . SYNCOPE 05/15/2010    Qualifier: Diagnosis of  By: Arnoldo Morale MD, [place], WITH RADICULOPATHY 10/01/2007    Qualifier: Diagnosis of  By: Arnoldo Morale MD, Balinda Quails   . Rosacea  05/31/2007    Qualifier: Diagnosis of  By: Arnoldo Morale MD, [place], HX OF 05/31/2007    Qualifier: Diagnosis of  By: Arnoldo Morale MD, Balinda Quails   . Lymph edema L arm from breast ca tx 12/22/2013  . H/O blood clots     hx of small blood clots in both legs  . Diabetes mellitus without complication  ([place])     type 2 - on Metformin  . Anxiety   . Neuropathy due to chemotherapeutic drug ([place])   . Breast cancer ([place]) 1988, age 45    right  . Breast cancer ([place]) 09/30/12    left, ER/PR -, Her 2 -  . Secondary lung cancer ([place]) 2016  . Anemia     during chemo  . History of IBS   . Glaucoma   . Dry skin   . Lymphedema of arm     left arm, post mastectomy  . Shortness of breath dyspnea     with exertion (since onset of pleural effusion) Wears O2 2 L as needed  . Chronic respiratory failure; On Home oxygen 2L since 12/03/2014 02/11/2015   Social History   Social History  . Marital Status: Married    Spouse Name: Christine [name]  . Number of Children: 1  . Years of Education: N/A   Occupational History  . Unemployed    Social History Main Topics  . Smoking status: Never Smoker   . Smokeless tobacco: Never Used  . Alcohol Use: No  . Drug Use: No  . Sexual Activity: Not Currently     Comment: menarche 64, 1st pregnancy age 52-miscarr, age 23 1st live birth, menop 69   Other Topics Concern  . None   Social History Narrative   Married. Lives w/ husband.  Has not worked since being diagnosed with cancer 2 years ago.  Previously worked as a Solicitor.  Independent with ADLs.      Spiritual Beliefs:Christian      Lifestyle: started the [employer] exercise program for cancer survivors (12/2013); working on diet            Family History  Problem Relation Age of Onset  . Colon cancer Mother 66    family hx of colon ca 1st degree relative <60  . Hypertension Mother   . Coronary artery disease Father     family hx of CAD female 1st degree relative <50  . Stomach cancer Neg Hx   . Rectal cancer Neg Hx   . Esophageal cancer Neg Hx   . Breast cancer Maternal Grandmother 80  . Diabetes Paternal Grandmother   . Breast cancer Cousin 48    maternal cousin  . Coronary artery disease Maternal Grandfather   . Breast cancer Sister     paternal half sister; died in her 65s    Scheduled Meds: . cholecalciferol  1,000 Units Oral q morning - 10a  . dexamethasone  4 mg Intravenous 4 times per day  . diltiazem  120 mg Oral q morning - 10a  . insulin aspart  0-9 Units Subcutaneous 6 times per day  . insulin glargine  8 Units Subcutaneous QHS  . latanoprost  1 drop Both Eyes QHS  . levothyroxine  50 mcg Oral Q breakfast  . loratadine  10 mg Oral Daily  . LORazepam  1 mg Intravenous Once  . nystatin  5 mL Oral BID  . potassium chloride  40 mEq Oral Once  . sodium  chloride  3 mL Intravenous Q12H  . timolol  1 drop Both Eyes BID   Continuous Infusions:  PRN Meds:.acetaminophen **OR** acetaminophen, alum & mag hydroxide-simeth, fluticasone, LORazepam, ondansetron **OR** ondansetron (ZOFRAN) IV, oxyCODONE, pantoprazole, polyethylene glycol, prochlorperazine, sodium chloride, tetrahydrozoline Medications Prior to Admission:  Prior to Admission medications   Medication Sig Start Date End Date Taking? Authorizing Provider  dexamethasone (DECADRON) 4 MG tablet Take 4 mg by mouth 3 (three) times daily.   Yes Historical Provider, MD  diltiazem (CARDIZEM CD) 120 MG 24 hr capsule Take 1 capsule (120 mg total) by mouth every morning. 01/04/15  Yes Christine Kern, DO  insulin glargine (LANTUS) 100 UNIT/ML injection Inject 0.08 mLs (8 Units total) into the skin at bedtime. 04/06/15  Yes Belkys A Regalado, MD  KLOR-CON M20 20 MEQ tablet TAKE 1 TABLET (20 MEQ TOTAL) BY MOUTH 2 (TWO) TIMES DAILY. 04/30/15  Yes Nicholas Lose, MD  latanoprost (XALATAN) 0.005 % ophthalmic solution Place 1 drop into both eyes at bedtime.    Yes Historical Provider, MD  levothyroxine (SYNTHROID, LEVOTHROID) 50 MCG tablet Take 1 tablet (50 mcg total) by mouth daily. 11/07/14  Yes Christine Kern, DO  lidocaine-prilocaine (EMLA) cream Apply to affected area once Patient taking differently: Apply 1 application topically as needed (for chemo). Apply to affected area once 11/16/14  Yes Nicholas Lose, MD  LORazepam (ATIVAN)  0.5 MG tablet Take 1 tablet (0.5 mg total) by mouth every 6 (six) hours as needed (Nausea or vomiting). 02/28/15  Yes Nicholas Lose, MD  metFORMIN (GLUCOPHAGE) 500 MG tablet Take 1000mg  (2 tabs) in the morning and 500mg  (1 tab) in the evening with meals 02/23/15  Yes Christine Kern, DO  nystatin (MYCOSTATIN) 100000 UNIT/ML suspension Take 5 mLs (500,000 Units total) by mouth 2 (two) times daily. 05/02/15  Yes Nicholas Lose, MD  ondansetron (ZOFRAN) 8 MG tablet Take 1 tablet (8 mg total) by mouth every 8 (eight) hours as needed for nausea or vomiting. 04/30/15  Yes Nicholas Lose, MD  oxyCODONE (OXY IR/ROXICODONE) 5 MG immediate release tablet Take 1 tablet (5 mg total) by mouth every 4 (four) hours as needed for moderate pain. 04/06/15  Yes Belkys A Regalado, MD  PRESCRIPTION MEDICATION Chemo   Yes Historical Provider, MD  timolol (TIMOPTIC) 0.5 % ophthalmic solution Place 1 drop into both eyes 2 (two) times daily.   Yes Historical Provider, MD  cetirizine (ZYRTEC) 10 MG tablet Take 10 mg by mouth daily as needed for allergies.     Historical Provider, MD  cholecalciferol (VITAMIN D) 1000 UNITS tablet Take 1,000 Units by mouth every morning.     Historical Provider, MD  esomeprazole (NEXIUM) 20 MG capsule Take 20 mg by mouth daily as needed (acid).     Historical Provider, MD  fluticasone (FLONASE) 50 MCG/ACT nasal spray Place 2 sprays into both nostrils daily as needed for allergies or rhinitis.    Historical Provider, MD  KLOR-CON M20 20 MEQ tablet Take 1 tablet (20 mEq total) by mouth 2 (two) times daily. Patient not taking: Reported on 05/09/2015 04/30/15   Nicholas Lose, MD  prochlorperazine (COMPAZINE) 10 MG tablet Take 1 tablet (10 mg total) by mouth every 6 (six) hours as needed (Nausea or vomiting). 11/16/14   Nicholas Lose, MD  Tetrahydrozoline HCl (VISINE OP) Apply 1-2 drops to eye daily as needed (itchy eyes.).    Historical Provider, MD   Allergies  Allergen Reactions  . Sulfa Antibiotics Hives,  Itching and  Swelling  . Sulfonamide Derivatives Hives, Itching and Swelling  . Tomato Hives and Itching   CBC:    Component Value Date/Time   WBC 6.3 05/09/2015 1343   WBC 5.8 05/02/2015 1109   HGB 10.6* 05/09/2015 1343   HGB 12.4 05/02/2015 1109   HCT 33.0* 05/09/2015 1343   HCT 37.7 05/02/2015 1109   PLT 44* 05/09/2015 1343   PLT 78* 05/02/2015 1109   MCV 100.0 05/09/2015 1343   MCV 97.1 05/02/2015 1109   NEUTROABS 4.9 05/09/2015 1343   NEUTROABS 5.0 05/02/2015 1109   LYMPHSABS 1.1 05/09/2015 1343   LYMPHSABS 0.4* 05/02/2015 1109   MONOABS 0.3 05/09/2015 1343   MONOABS 0.3 05/02/2015 1109   EOSABS 0.0 05/09/2015 1343   EOSABS 0.1 05/02/2015 1109   BASOSABS 0.0 05/09/2015 1343   BASOSABS 0.0 05/02/2015 1109   Comprehensive Metabolic Panel:    Component Value Date/Time   NA 142 05/10/2015 1025   NA 140 05/02/2015 1109   K 3.3* 05/10/2015 1025   K 3.4* 05/02/2015 1109   CL 106 05/10/2015 1025   CL 101 01/25/2013 0952   CO2 26 05/10/2015 1025   CO2 25 05/02/2015 1109   BUN 11 05/10/2015 1025   BUN 20.1 05/02/2015 1109   CREATININE 0.52 05/10/2015 1025   CREATININE 0.8 05/02/2015 1109   GLUCOSE 208* 05/10/2015 1025   GLUCOSE 122 05/02/2015 1109   GLUCOSE 169* 01/25/2013 0952   CALCIUM 6.7* 05/10/2015 1025   CALCIUM 8.8 05/02/2015 1109   AST 22 05/09/2015 1343   AST 21 05/02/2015 1109   ALT 21 05/09/2015 1343   ALT 23 05/02/2015 1109   ALKPHOS 70 05/09/2015 1343   ALKPHOS 67 05/02/2015 1109   BILITOT 0.8 05/09/2015 1343   BILITOT 0.59 05/02/2015 1109   PROT 6.3* 05/09/2015 1343   PROT 5.4* 05/02/2015 1109   ALBUMIN 3.2* 05/09/2015 1343   ALBUMIN 3.2* 05/02/2015 1109    Physical Exam: Vital Signs: BP 111/71 mmHg  Pulse 98  Temp(Src) 98.1 F (36.7 C) (Oral)  Resp 18  Ht 5\' 5"  (1.651 m)  Wt 75.2 kg (165 lb 12.6 oz)  BMI 27.59 kg/m2  SpO2 98% SpO2: SpO2: 98 % O2 Device: O2 Device: Nasal Cannula O2 Flow Rate: O2 Flow Rate (L/min): 3  L/min Intake/output summary:  Intake/Output Summary (Last 24 hours) at 05/10/15 1610 Last data filed at 05/10/15 1300  Gross per 24 hour  Intake 1379.16 ml  Output      0 ml  Net 1379.16 ml   LBM: Last BM Date: 05/09/15 Baseline Weight: Weight: 75.2 kg (165 lb 12.6 oz) Most recent weight: Weight: 75.2 kg (165 lb 12.6 oz)  Exam Findings:   awake alert some what confused NAD Clear S1S2 Abdomen soft No edema                        Palliative Performance Scale: 40% Additional Data Reviewed: Recent Labs     05/09/15  1343  05/10/15  1025  WBC  6.3   --   HGB  10.6*   --   PLT  44*   --   NA  142  142  BUN  11  11  CREATININE  0.47  0.52     Time In: 150 Time Out 1600 Time Total: 60 Greater than 50%  of this time was spent counseling and coordinating care related to the above assessment and plan.  Signed by: Loistine Chance, MD [id number]  Loistine Chance, MD  05/10/2015, 4:10 PM  Please contact Palliative Medicine Team phone at [phone] for questions and concerns.

## 2015-05-10 NOTE — Clinical Documentation Improvement (Signed)
Hospitalist  Based on the clinical findings below, please document any associated diagnoses/conditions the patient has or may have.  Cerebral Edema  Encephalopathy   Other  Clinically Undetermined  Please update your documentation within the medical record to reflect your response to this query.  Supporting Information: Breast cancer with brain metastasis. " Mental status changes, expressive aphasia"  Brain MRI 05-09-15 FINDINGS: Multiple enhancing brain metastases are again seen bilaterally.  There is associated vasogenic edema   Please exercise your independent, professional judgment when responding. A specific answer is not anticipated or expected.  Thank You, Alessandra Grout, RN, BSN, CCDS,Clinical Documentation Specialist:  [phone]  [phone]=[facility] [place] [facility]- Health Information Management

## 2015-05-11 ENCOUNTER — Other Ambulatory Visit: Payer: Self-pay | Admitting: Surgery

## 2015-05-11 ENCOUNTER — Other Ambulatory Visit: Payer: Self-pay | Admitting: Radiation Oncology

## 2015-05-11 DIAGNOSIS — R4182 Altered mental status, unspecified: Secondary | ICD-10-CM | POA: Insufficient documentation

## 2015-05-11 DIAGNOSIS — E119 Type 2 diabetes mellitus without complications: Secondary | ICD-10-CM

## 2015-05-11 DIAGNOSIS — C7931 Secondary malignant neoplasm of brain: Secondary | ICD-10-CM

## 2015-05-11 DIAGNOSIS — Z515 Encounter for palliative care: Secondary | ICD-10-CM

## 2015-05-11 DIAGNOSIS — C7800 Secondary malignant neoplasm of unspecified lung: Secondary | ICD-10-CM

## 2015-05-11 LAB — BASIC METABOLIC PANEL
Anion gap: 5 (ref 5–15)
BUN: 17 mg/dL (ref 6–20)
CHLORIDE: 110 mmol/L (ref 101–111)
CO2: 28 mmol/L (ref 22–32)
CREATININE: 0.64 mg/dL (ref 0.44–1.00)
Calcium: 6.8 mg/dL — ABNORMAL LOW (ref 8.9–10.3)
GFR calc non Af Amer: >60 mL/min (ref >60)
Glucose, Bld: 224 mg/dL — ABNORMAL HIGH (ref 65–99)
POTASSIUM: 3.9 mmol/L (ref 3.5–5.1)
Sodium: 143 mmol/L (ref 135–145)

## 2015-05-11 LAB — CBC
HEMATOCRIT: 26.3 % — AB (ref 36.0–46.0)
HEMOGLOBIN: 8.6 g/dL — AB (ref 12.0–15.0)
MCH: 32.1 pg (ref 26.0–34.0)
MCHC: 32.7 g/dL (ref 30.0–36.0)
MCV: 98.1 fL (ref 78.0–100.0)
Platelets: 48 10*3/uL — ABNORMAL LOW (ref 150–400)
RBC: 2.68 MIL/uL — AB (ref 3.87–5.11)
RDW: 17.8 % — ABNORMAL HIGH (ref 11.5–15.5)
WBC: 8.4 10*3/uL (ref 4.0–10.5)

## 2015-05-11 LAB — GLUCOSE, CAPILLARY
GLUCOSE-CAPILLARY: 162 mg/dL — AB (ref 65–99)
GLUCOSE-CAPILLARY: 262 mg/dL — AB (ref 65–99)
Glucose-Capillary: 203 mg/dL — ABNORMAL HIGH (ref 65–99)
Glucose-Capillary: 254 mg/dL — ABNORMAL HIGH (ref 65–99)

## 2015-05-11 MED ORDER — INSULIN ASPART 100 UNIT/ML ~~LOC~~ SOLN: 0.0000 [IU] | Freq: Three times a day (TID) | SUBCUTANEOUS | Status: DC

## 2015-05-11 MED ORDER — HEPARIN SOD (PORK) LOCK FLUSH 100 UNIT/ML IV SOLN
500.0000 [IU] | INTRAVENOUS | Status: AC | PRN
  Administered 2015-05-11: 500 [IU]

## 2015-05-11 MED ORDER — DEXAMETHASONE 4 MG PO TABS: ORAL_TABLET | ORAL | Status: DC

## 2015-05-11 NOTE — Progress Notes (Signed)
Pt selected [facility] for [facility], referral given to in rep.

## 2015-05-11 NOTE — Progress Notes (Addendum)
Patient ID: Christine [name], female   DOB: 03/21/1956, 59 y.o.   MRN: [id number]  [facility] PROGRESS NOTE  Christine [name] VZD:[id number] DOB: [birth year] DOA: 05/09/2015 PCP: Lucretia Kern., DO   Brief narrative:    59 y.o. female with a PMH of triple negative left breast cancer diagnosed 09/30/14 s/p neoadjuvant chemo following surgery followed by adjuvant XRT with concurrent Xeloda, with progression of disease noted 3/16 (biopsy proven lung metastasis) and brain mets discovered 8/16, s/p whole brain radiation, who was brought to the ED for evaluation of mental status changes. The patient was noted to be at her usual baseline at around 0500 on the day of the admission, according to her husband. At 12:00, the patient was talking but "not making sense".  Assessment/Plan:    Principal Problem:   Acute encephalopathy - possibly related to progression on brain metastasis in the setting of known metastatic breast cancer - mental status clear this AM, pt tolerating diet well - continue Decadron and follow up on oncologist recommendations  Active Problems:   Brain metastases ([place]) - mixed response to therapy based in MRI brain - oncologist Dr. Lindi Adie to discuss with Dr. Pablo Ledger rad oncologist     Expressive aphasia - ? Related to the above - resolved - tolerating diet well     Thrombocytopenia (HCC), chemotherapy induced - no signs of active bleeding - keep on SCD's for DVT prophylaxis  - CBC in AM - no indication for transfusion at this time     Acute on chronic respiratory failure with hypoxia (HCC) - oxygen saturation still 84% on RA requiring oxygen supplementation via [place] - this is likely secondary to right plural effusion and lung metastasis  - monitor respiratory status closely  - will need to reassess oxygen needs prior to discharge, asked to check oxygen level with ambulation     Recurrent pleural effusion on right in the setting of lung metastasis  - monitor as  noted above and keep on oxygen for now    Breast cancer of upper-outer quadrant of left female breast ([place]) - will follow up on recommendations of oncologist Dr. Lindi Adie     Lung metastasis [facility]) - currently requiring oxygen due to hypoxia  - monitor     Hypokalemia - supplement and repeat BMP in AM    Tachycardia secondary to metastatic breast cancer - no chest pain this AM - monitor on tele for now  - continue Cardizem per home medical regimen     Hypothyroidism - continue synthroid per home medical regimen     Essential hypertension - reasonable inpatient control     Anemia of chronic disease, malignancy - no sings of active bleeding but Hg did drop overnight from 10.6  --> 8.6 - CBC in AM    Diabetes mellitus, type II ([place]) - continue SSI and Lantus 8 U QHS  DVT prophylaxis - SCD's  Code Status: DNR Family Communication:  plan of care discussed with the patient, husband and sister at bedside  Disposition Plan: Home in 1-2 days, when oncologist clears for discharge   IV access:  Peripheral IV  Procedures and diagnostic studies:    Ct Head Wo Contrast 05/09/2015   Areas of low-density affecting the cerebellum and cerebral hemispheres consistent with the known metastatic disease demonstrated on the MRI of 04/03/2015. This does not appear grossly progressive. No new lesions are seen. No evidence of vascular territory infarction, hemorrhage, mass effect or shift.     Mr  Brain W Wo Contrast 05/09/2015  Mixed response to therapy with decrease in the majority of lesions, but several lesions have increased in size. 2. Increased size of lesions in the high anterior right frontal lobe, inferior left frontal lobe lateral, and bilateral cerebellum. 3. New lesions in the anterior inferior right frontal lobe and lateral right temporal lobe.     Nm Pet Image Restag (ps) Skull Base To Thigh 04/24/2015  Significant response to interval therapy. The bilateral pulmonary nodules have all  decreased in size and metabolic activity. 2. Near-complete resolution of large malignant right pleural effusion following presumed pleurodesis. There is mild residual hypermetabolic activity within the right pleural space. 3. No evidence of chest wall recurrence or extra thoracic metastatic disease.  Dg Chest Port 1 View 05/09/2015  Moderate enlargement of the cardiopericardial silhouette with indistinct pulmonary vasculature compatible with pulmonary venous hypertension. 2. Stable right pleural density favoring pleural effusion  Medical Consultants:  Oncology Dr. Lindi Adie  PCT  Other Consultants:  PT/OT - [facility] PT recommended, orders placed   IAnti-Infectives:   None   Faye Ramsay, MD  [facility] Pager [phone]  If 7PM-7AM, please contact night-coverage [email or web address] Password [email or web address] 05/11/2015, 12:29 PM   LOS: 2 days   HPI/Subjective: No events overnight. Feels better and wants to eat this AM.   Objective: Filed Vitals:   05/10/15 1337 05/10/15 2027 05/11/15 0410 05/11/15 1020  BP: 111/71 125/73 117/75   Pulse: 98 98 84 135  Temp: 98.1 F (36.7 C) 98 F (36.7 C) 98 F (36.7 C)   TempSrc: Oral Oral Oral   Resp: 18 20 20    Height:      Weight:      SpO2: 98% 100% 100% 87%    Intake/Output Summary (Last 24 hours) at 05/11/15 1229 Last data filed at 05/11/15 0900  Gross per 24 hour  Intake   1080 ml  Output      0 ml  Net   1080 ml    Exam:   General:  Pt is alert, follows commands appropriately, not in acute distress  Cardiovascular: Regular rate, tachycardic, S1/S2, no murmurs, no rubs, no gallops  Respiratory: Clear to auscultation bilaterally, diminished breath sounds on the right side   Abdomen: Soft, non tender, non distended, bowel sounds present, no guarding  Extremities: pulses DP and PT palpable bilaterally  Neuro: Grossly nonfocal  Data Reviewed: Basic Metabolic Panel:  Recent Labs Lab 05/09/15 1343 05/10/15 1025 05/11/15 0420  NA 142 142  143  K 3.1* 3.3* 3.9  CL 106 106 110  CO2 27 26 28   GLUCOSE 138* 208* 224*  BUN 11 11 17   CREATININE 0.47 0.52 0.64  CALCIUM 7.2* 6.7* 6.8*   Liver Function Tests:  Recent Labs Lab 05/09/15 1343  AST 22  ALT 21  ALKPHOS 70  BILITOT 0.8  PROT 6.3*  ALBUMIN 3.2*   CBC:  Recent Labs Lab 05/09/15 1343 05/11/15 0420  WBC 6.3 8.4  NEUTROABS 4.9  --   HGB 10.6* 8.6*  HCT 33.0* 26.3*  MCV 100.0 98.1  PLT 44* 48*   Cardiac Enzymes:  Recent Labs Lab 05/09/15 1343  TROPONINI <0.03  CBG:  Recent Labs Lab 05/10/15 1622 05/10/15 2024 05/10/15 2339 05/11/15 0408 05/11/15 0728  GLUCAP 236* 184* 269* 203* 162*   Scheduled Meds: . cholecalciferol  1,000 Units Oral q morning - 10a  . dexamethasone  4 mg Intravenous 4 times per day  . diltiazem  120 mg Oral q morning -  10a  . insulin aspart  0-9 Units Subcutaneous 6 times per day  . insulin glargine  8 Units Subcutaneous QHS  . latanoprost  1 drop Both Eyes QHS  . levothyroxine  50 mcg Oral Q breakfast  . loratadine  10 mg Oral Daily  . LORazepam  1 mg Intravenous Once  . nystatin  5 mL Oral BID  . timolol  1 drop Both Eyes BID   Continuous Infusions:

## 2015-05-11 NOTE — Progress Notes (Signed)
Physical Therapy Treatment Patient Details Name: Christine [name] MRN: [id number] DOB: [birth year] Today's Date: 05/11/2015    History of Present Illness Christine [name] is an 59 y.o. female with a PMH of triple negative left breast cancer diagnosed 09/30/14 s/p neoadjuvant chemo following surgery followed by adjuvant XRT with concurrent Xeloda, with progression of disease noted 3/16 (biopsy proven lung metastasis) and brain mets discovered 8/16, status post whole brain radiation, who was brought to the ED today for evaluation of mental status changes.     PT Comments    Assisted with amb a great dsitance in hallway with NO AD and no LOB.    Follow Up Recommendations  Home health PT;Supervision for mobility/OOB     Equipment Recommendations  None recommended by PT    Recommendations for Other Services       Precautions / Restrictions Precautions Precautions: Fall Precaution Comments: monitor O2 and HR Restrictions Weight Bearing Restrictions: No    Mobility  Bed Mobility Overal bed mobility: Needs Assistance Bed Mobility: Supine to Sit;Sit to Supine     Supine to sit: Supervision;Min guard Sit to supine: Supervision;Min guard   General bed mobility comments: increased time  Transfers Overall transfer level: Needs assistance Equipment used: None Transfers: Sit to/from Stand Sit to Stand: Min guard;Min assist         General transfer comment: one VC on safety with turns otherwise well performed  Ambulation/Gait Ambulation/Gait assistance: Supervision;Min guard Ambulation Distance (Feet): 185 Feet Assistive device: None Gait Pattern/deviations: Step-through pattern;Decreased stride length Gait velocity: decreased   General Gait Details: slightly unsteady but no true LOB.  Tolerated a great distance.     Stairs            Wheelchair Mobility    Modified Rankin (Stroke Patients Only)       Balance     Sitting balance-Leahy Scale: Good        Standing balance-Leahy Scale: Fair                      Cognition Arousal/Alertness: Awake/alert Behavior During Therapy: WFL for tasks assessed/performed Overall Cognitive Status: Within Functional Limits for tasks assessed Area of Impairment: Safety/judgement;Orientation;Memory;Problem solving Orientation Level: Time       Safety/Judgement: Decreased awareness of safety;Decreased awareness of deficits   Problem Solving: Requires verbal cues General Comments: pt able to verbalize her thoughts better today. Not able to state current month and kept saying 'its Fall." Able to state her name and birthdate better today. She still requires verbal cues for sequencing tasks.     Exercises      General Comments        Pertinent Vitals/Pain Pain Assessment: No/denies pain    Home Living Family/patient expects to be discharged to:: Private residence Living Arrangements: Spouse/significant other Available Help at Discharge: Family;Available PRN/intermittently Type of Home: House Home Access: Stairs to enter Entrance Stairs-Rails:  (one rail) Home Layout: One level Home Equipment: Cane - single point;Shower seat - built in      Prior Function        Comments: per husband on 04/11/15 pt was able to mobilize independently. used cane when she went out of house. Was able to do own bathing, dressing, toileting. Has a built in shower seat.    PT Goals (current goals can now be found in the care plan section) Acute Rehab PT Goals Patient Stated Goal: wants to do what she can for herself. Progress towards PT goals:  Progressing toward goals    Frequency  Min 3X/week    PT Plan Current plan remains appropriate    Co-evaluation             End of Session Equipment Utilized During Treatment: Gait belt Activity Tolerance: Patient tolerated treatment well Patient left: in bed;with call bell/phone within reach;with bed alarm set     Time: 1145-1210 PT Time Calculation  (min) (ACUTE ONLY): 25 min  Charges:  $Gait Training: 8-22 mins $Therapeutic Activity: 8-22 mins                    G Codes:      Rica Koyanagi  PTA [facility]  Acute  Rehab Pager      [phone]

## 2015-05-11 NOTE — Progress Notes (Signed)
HEMATOLOGY-ONCOLOGY PROGRESS NOTE  SUBJECTIVE: Improvement in mental status. Still has memory issues most likely related to brain metastases as well as to prior radiation therapy. She was able to walk without assistance today.  OBJECTIVE: PHYSICAL EXAMINATION: ECOG PERFORMANCE STATUS: 2 - Symptomatic, <50% confined to bed  Filed Vitals:   05/11/15 1020  BP:   Pulse: 135  Temp:   Resp:    Filed Weights   05/09/15 1803  Weight: 165 lb 12.6 oz (75.2 kg)    GENERAL:alert, no distress and comfortable, facial puffiness SKIN: skin color, texture, turgor are normal, no rashes or significant lesions EYES: normal, Conjunctiva are pink and non-injected, sclera clear OROPHARYNX:no exudate, no erythema and lips, buccal mucosa, and tongue normal  NECK: supple, thyroid normal size, non-tender, without nodularity LYMPH:  no palpable lymphadenopathy in the cervical, axillary or inguinal LUNGS: clear to auscultation and percussion with normal breathing effort HEART: regular rate & rhythm and no murmurs and no lower extremity edema ABDOMEN:abdomen soft, non-tender and normal bowel sounds NEURO: alert & oriented x 3 with fluent speech, no focal motor/sensory deficits  LABORATORY DATA:  I have reviewed the data as listed CMP Latest Ref Rng 05/11/2015 05/10/2015 05/09/2015  Glucose 65 - 99 mg/dL 224(H) 208(H) 138(H)  BUN 6 - 20 mg/dL 17 11 11   Creatinine 0.44 - 1.00 mg/dL 0.64 0.52 0.47  Sodium 135 - 145 mmol/L 143 142 142  Potassium 3.5 - 5.1 mmol/L 3.9 3.3(L) 3.1(L)  Chloride 101 - 111 mmol/L 110 106 106  CO2 22 - 32 mmol/L 28 26 27   Calcium 8.9 - 10.3 mg/dL 6.8(L) 6.7(L) 7.2(L)  Total Protein 6.5 - 8.1 g/dL - - 6.3(L)  Total Bilirubin 0.3 - 1.2 mg/dL - - 0.8  Alkaline Phos 38 - 126 U/L - - 70  AST 15 - 41 U/L - - 22  ALT 14 - 54 U/L - - 21    Lab Results  Component Value Date   WBC 8.4 05/11/2015   HGB 8.6* 05/11/2015   HCT 26.3* 05/11/2015   MCV 98.1 05/11/2015   PLT 48* 05/11/2015    NEUTROABS 4.9 05/09/2015    ASSESSMENT AND PLAN: 1. Metastatic breast cancer with lung and brain metastases: I discussed the case with Dr. Pablo Ledger. Even though we both believe that the new metastatic lesions are most likely progression of disease, there is a slight possibility that this could be a pseudo-progression. Patient has done remarkably well on IV dexamethasone. She has now complete normalization of her mental function.  Our plan is to gradually reduce the dosage of dexamethasone over a prolonged period of time. I recommended that she should be discharged home on 4 mg 3 times a day for the next 1 week. We will then reduce it to 4 mg twice a day. After that point we will reduce it at a much slower pace.  2. Dr. Pablo Ledger recommended doing a brain MRI in one month. At that time they can consider different options including stereotactic radiosurgery.  3. I do not believe patient is allergic to receive chemotherapy with severe thrombocytopenia 48. I recommended watchful monitoring to see what happens in the next month. If her performance status improves, then we can consider other options. If her performance status continues to decline, then we will recommend hospice care.  4. Diabetes: Patient will need to undergo diabetic education regarding how to take insulin. I would like to see her back in 2 weeks for follow-up. Thank you very much for your  care

## 2015-05-11 NOTE — Progress Notes (Signed)
Occupational Therapy Treatment Patient Details Name: Christine [name] MRN: [id number] DOB: 1956-05-19 Today's Date: 05/11/2015    History of present illness Christine [name] is an 59 y.o. female with a PMH of triple negative left breast cancer diagnosed 09/30/14 s/p neoadjuvant chemo following surgery followed by adjuvant XRT with concurrent Xeloda, with progression of disease noted 3/16 (biopsy proven lung metastasis) and brain mets discovered 8/16, status post whole brain radiation, who was brought to the ED today for evaluation of mental status changes.    OT comments  Pt overall doing better with being able to verbalize her thoughts/needs today. Still having difficulty with sequencing and problem solving tasks and with orientation questions. Husband present for session-discussed need for 24/7 assist/supervision at d/c and he verbalized understanding. Tried pt on RA and O2 sats briefly at 87-89% but up to 90s with rest. Pt at 90-91% on RA when OT left and nursing requested to leave O2 off and put pt on continuous pulse ox. HR with activity 120s-135 max. Nursing made aware of vitals. HR down to 100 at end of session.    Follow Up Recommendations  Home health OT;Supervision/Assistance - 24 hour    Equipment Recommendations  None recommended by OT    Recommendations for Other Services      Precautions / Restrictions Precautions Precautions: Fall Precaution Comments: monitor O2 and HR Restrictions Weight Bearing Restrictions: No       Mobility Bed Mobility Overal bed mobility: Needs Assistance Bed Mobility: Supine to Sit;Sit to Supine     Supine to sit: Min guard Sit to supine: Min guard   General bed mobility comments: min guard for safety.   Transfers Overall transfer level: Needs assistance Equipment used: None Transfers: Sit to/from Stand Sit to Stand: Min assist         General transfer comment: min assist to guide pt down to commode with use of bar. Pt stood from EOB  with light min assist.     Balance     Sitting balance-Leahy Scale: Good       Standing balance-Leahy Scale: Fair                     ADL       Grooming: Min Dispensing optician: Minimal assistance;Ambulation;Comfort height toilet;Grab bars   Toileting- Clothing Manipulation and Hygiene: Minimal assistance;Sit to/from stand         General ADL Comments: Pt transferred to bathroom on RA pushing IV pole intermittently. HR up to 120 but came down with rest sitting on commode. Note O2 briefly at 87-89% but came up to low 90s on RA with rest also. Pt returned to EOB to perform sponge bathe. HR with exertion of bathing up to 135 at the max and down to 110s with rest break. She required mod verbal cues to complete as she would forget to put water on washcloth or only wet  the very tip of cloth to wash UEs. Husband present for session and discussed with him the need for 24/7 supervision/assist for safety at d/c and he verbalized understanding. Pt better able to verbalize thoughts and make her needs known. She could not state the current month but able to state her name and birthdate much better today.       Vision  Additional Comments: pt had glasses today and appeared to help.    Perception     Praxis      Cognition   Behavior During Therapy: WFL for tasks assessed/performed Overall Cognitive Status: Impaired/Different from baseline Area of Impairment: Safety/judgement;Orientation;Memory;Problem solving Orientation Level: Time        Safety/Judgement: Decreased awareness of safety;Decreased awareness of deficits   Problem Solving: Requires verbal cues General Comments: pt able to verbalize her thoughts better today. Not able to state current month and kept saying 'its Fall." Able to state her name and birthdate better today. She still requires verbal cues for sequencing tasks.     Extremity/Trunk Assessment                Exercises     Shoulder Instructions       General Comments      Pertinent Vitals/ Pain       Pain Assessment: No/denies pain  Home Living Family/patient expects to be discharged to:: Private residence Living Arrangements: Spouse/significant other Available Help at Discharge: Family;Available PRN/intermittently Type of Home: House Home Access: Stairs to enter [employer] of Steps: 4 Entrance Stairs-Rails:  (one rail) Home Layout: One level     Bathroom Shower/Tub: Occupational psychologist: Standard     Home Equipment: Cane - single point;Shower seat - built in          Prior Functioning/Environment          Comments: per husband on 04/11/15 pt was able to mobilize independently. used cane when she went out of house. Was able to do own bathing, dressing, toileting. Has a built in shower seat.    Frequency Min 2X/week     Progress Toward Goals  OT Goals(current goals can now be found in the care plan section)  Progress towards OT goals: Progressing toward goals  Acute Rehab OT Goals Patient Stated Goal: wants to do what she can for herself. OT Goal Formulation: With patient/family  Plan Discharge plan needs to be updated    Co-evaluation                 End of Session Equipment Utilized During Treatment: Gait belt   Activity Tolerance Patient tolerated treatment well   Patient Left in bed;with call bell/phone within reach;with nursing/sitter in room;with family/visitor present;with SCD's reapplied;with bed alarm set   Nurse Communication          Time: 5462-7035 OT Time Calculation (min): 48 min  Charges: OT General Charges $OT Visit: 1 Procedure OT Treatments $Self Care/Home Management : 23-37 mins $Therapeutic Activity: 8-22 mins  Jules Schick  [phone] 05/11/2015, 1:14 PM

## 2015-05-11 NOTE — Progress Notes (Signed)
Went over all discharge instructions with patient and family.  Watched husband check blood sugar and administer insulin.  Pt verbalized that he understood how to administer insulin.  All questions answered.  Prescriptions and discharge AVS given.  Portacath deaccessed.  Pt will be wheeled out when ride arrives.

## 2015-05-11 NOTE — Discharge Summary (Signed)
Physician Discharge Summary  Christine [name] KZS:[id number] DOB: 1956/07/21 DOA: 05/09/2015  PCP: Lucretia Kern., DO  Admit date: 05/09/2015 Discharge date: 05/11/2015  Recommendations for Outpatient Follow-up:  1. Pt will need to follow up with PCP in 2-3 weeks post discharge 2. Please obtain BMP to evaluate electrolytes and kidney function 3. Please also check CBC to evaluate Hg and Hct levels  Discharge Diagnoses:  Principal Problem:   Acute encephalopathy Active Problems:   Brain metastases (HCC)   Expressive aphasia   Thrombocytopenia (HCC)   Acute on chronic respiratory failure with hypoxia (HCC)   Recurrent pleural effusion on right   Breast cancer of upper-outer quadrant of left female breast (HCC)   Lung metastasis (HCC)   Hypokalemia   Tachycardia   Hypothyroidism   Essential hypertension   Anemia of chronic disease   Diabetes mellitus, type II ([place])   Encounter for palliative care   Discharge Condition: Stable  Diet recommendation: Heart healthy diet discussed in details     Brief narrative:    59 y.o. female with a PMH of triple negative left breast cancer diagnosed 09/30/14 s/p neoadjuvant chemo following surgery followed by adjuvant XRT with concurrent Xeloda, with progression of disease noted 3/16 (biopsy proven lung metastasis) and brain mets discovered 8/16, s/p whole brain radiation, who was brought to the ED for evaluation of mental status changes. The patient was noted to be at her usual baseline at around 0500 on the day of the admission, according to her husband. At 12:00, the patient was talking but "not making sense".  Assessment/Plan:    Principal Problem:  Acute encephalopathy - possibly related to progression on brain metastasis in the setting of known metastatic breast cancer - mental status clear this AM, pt tolerating diet well - oncologist cleared for discharge   Active Problems:  Brain metastases ([place]) - mixed response to  therapy based in MRI brain - oncologist Dr. Lindi Adie to discuss with Dr. Pablo Ledger rad oncologist  - pt OK to be d/c with outpatient follow up    Expressive aphasia - ? Related to the above - resolved - tolerating diet well    Thrombocytopenia (HCC), chemotherapy induced - no signs of active bleeding - no indication for transfusion at this time    Acute on chronic respiratory failure with hypoxia (HCC) - oxygen saturation still 84% on RA requiring oxygen supplementation via  - this is likely secondary to right plural effusion and lung metastasis    Recurrent pleural effusion on right in the setting of lung metastasis  - resp status stable    Breast cancer of upper-outer quadrant of left female breast [facility]) - will follow up on recommendations of oncologist Dr. Lindi Adie    Lung metastasis [facility]) - currently not requiring oxygen on RA   Hypokalemia - supplemented prior to discharge    Tachycardia secondary to metastatic breast cancer - no chest pain this AM - continue Cardizem per home medical regimen    Hypothyroidism - continue synthroid per home medical regimen    Essential hypertension - reasonable inpatient control    Anemia of chronic disease, malignancy - no sings of active bleeding    Diabetes mellitus, type II ([place]) - continue insulin  Code Status: DNR Family Communication: plan of care discussed with the patient, husband and sister at bedside  Disposition Plan: Home in 1-2 days, when oncologist clears for discharge   IV access:  Peripheral IV  Procedures and diagnostic studies:   Ct Head  Wo Contrast 05/09/2015 Areas of low-density affecting the cerebellum and cerebral hemispheres consistent with the known metastatic disease demonstrated on the MRI of 04/03/2015. This does not appear grossly progressive. No new lesions are seen. No evidence of vascular territory infarction, hemorrhage, mass effect or shift.   Mr [name] Wo  Contrast 05/09/2015 Mixed response to therapy with decrease in the majority of lesions, but several lesions have increased in size. 2. Increased size of lesions in the high anterior right frontal lobe, inferior left frontal lobe lateral, and bilateral cerebellum. 3. New lesions in the anterior inferior right frontal lobe and lateral right temporal lobe.   Nm Pet Image Restag (ps) Skull Base To Thigh 04/24/2015 Significant response to interval therapy. The bilateral pulmonary nodules have all decreased in size and metabolic activity. 2. Near-complete resolution of large malignant right pleural effusion following presumed pleurodesis. There is mild residual hypermetabolic activity within the right pleural space. 3. No evidence of chest wall recurrence or extra thoracic metastatic disease.  Dg Chest Port 1 View 05/09/2015 Moderate enlargement of the cardiopericardial silhouette with indistinct pulmonary vasculature compatible with pulmonary venous hypertension. 2. Stable right pleural density favoring pleural effusion  Medical Consultants:  Oncology Dr. Lindi Adie  PCT  Other Consultants:  PT/OT - [facility] PT recommended, orders placed   IAnti-Infectives:   None      Discharge Exam: Filed Vitals:   05/11/15 1020  BP:   Pulse: 135  Temp:   Resp:    Filed Vitals:   05/10/15 1337 05/10/15 2027 05/11/15 0410 05/11/15 1020  BP: 111/71 125/73 117/75   Pulse: 98 98 84 135  Temp: 98.1 F (36.7 C) 98 F (36.7 C) 98 F (36.7 C)   TempSrc: Oral Oral Oral   Resp: 18 20 20    Height:      Weight:      SpO2: 98% 100% 100% 87%    General: Pt is alert, follows commands appropriately, not in acute distress Cardiovascular: Regular rate and rhythm, S1/S2 +, no murmurs, no rubs, no gallops Respiratory: Clear to auscultation bilaterally, no wheezing, no crackles, no rhonchi Abdominal: Soft, non tender, non distended, bowel sounds +, no guarding  Discharge Instructions  Discharge  Instructions    Diet - low sodium heart healthy    Complete by:  As directed      Increase activity slowly    Complete by:  As directed             Medication List    TAKE these medications        cetirizine 10 MG tablet  Commonly known as:  ZYRTEC  Take 10 mg by mouth daily as needed for allergies.     cholecalciferol 1000 UNITS tablet  Commonly known as:  VITAMIN D  Take 1,000 Units by mouth every morning.     dexamethasone 4 MG tablet  Commonly known as:  DECADRON  Continue taking 4 mg tablet three time daily for one week until October 13th, 2016 and starting October 14th, 2016 start taking 4 mg tablet twice daily for one week. Dr. Lindi Adie will see patient within next two weeks and will continue to taper down.     diltiazem 120 MG 24 hr capsule  Commonly known as:  CARDIZEM CD  Take 1 capsule (120 mg total) by mouth every morning.     esomeprazole 20 MG capsule  Commonly known as:  NEXIUM  Take 20 mg by mouth daily as needed (acid).  fluticasone 50 MCG/ACT nasal spray  Commonly known as:  FLONASE  Place 2 sprays into both nostrils daily as needed for allergies or rhinitis.     insulin aspart 100 UNIT/ML injection  Commonly known as:  novoLOG  Inject 0-9 Units into the skin 3 (three) times daily with meals.     insulin glargine 100 UNIT/ML injection  Commonly known as:  LANTUS  Inject 0.08 mLs (8 Units total) into the skin at bedtime.     KLOR-CON M20 20 MEQ tablet  Generic drug:  potassium chloride SA  TAKE 1 TABLET (20 MEQ TOTAL) BY MOUTH 2 (TWO) TIMES DAILY.     latanoprost 0.005 % ophthalmic solution  Commonly known as:  XALATAN  Place 1 drop into both eyes at bedtime.     levothyroxine 50 MCG tablet  Commonly known as:  SYNTHROID, LEVOTHROID  Take 1 tablet (50 mcg total) by mouth daily.     lidocaine-prilocaine cream  Commonly known as:  EMLA  Apply to affected area once     LORazepam 0.5 MG tablet  Commonly known as:  ATIVAN  Take 1 tablet (0.5  mg total) by mouth every 6 (six) hours as needed (Nausea or vomiting).     metFORMIN 500 MG tablet  Commonly known as:  GLUCOPHAGE  Take 1000mg  (2 tabs) in the morning and 500mg  (1 tab) in the evening with meals     nystatin 100000 UNIT/ML suspension  Commonly known as:  MYCOSTATIN  Take 5 mLs (500,000 Units total) by mouth 2 (two) times daily.     ondansetron 8 MG tablet  Commonly known as:  ZOFRAN  Take 1 tablet (8 mg total) by mouth every 8 (eight) hours as needed for nausea or vomiting.     oxyCODONE 5 MG immediate release tablet  Commonly known as:  Oxy IR/ROXICODONE  Take 1 tablet (5 mg total) by mouth every 4 (four) hours as needed for moderate pain.     PRESCRIPTION MEDICATION  Chemo     prochlorperazine 10 MG tablet  Commonly known as:  COMPAZINE  Take 1 tablet (10 mg total) by mouth every 6 (six) hours as needed (Nausea or vomiting).     timolol 0.5 % ophthalmic solution  Commonly known as:  TIMOPTIC  Place 1 drop into both eyes 2 (two) times daily.     VISINE OP  Apply 1-2 drops to eye daily as needed (itchy eyes.).           Follow-up Information    Follow up with Colin Benton R., DO.   Specialty:  Family Medicine   Contact information:   [place] [address] [phone]       Call Faye Ramsay, MD.   Specialty:  Internal Medicine   Why:  As needed call my cell phone [phone]   Contact information:   [address] [facility] [place] [address] [phone]        The results of significant diagnostics from this hospitalization (including imaging, microbiology, ancillary and laboratory) are listed below for reference.     Microbiology: No results found for this or any previous visit (from the past 240 hour(s)).   Labs: Basic Metabolic Panel:  Recent Labs Lab 05/09/15 1343 05/10/15 1025 05/11/15 0420  NA 142 142 143  K 3.1* 3.3* 3.9  CL 106 106 110  CO2 27 26 28   GLUCOSE 138* 208* 224*   BUN 11 11 17   CREATININE 0.47 0.52 0.64  CALCIUM 7.2* 6.7* 6.8*   Liver Function Tests:  Recent Labs Lab 05/09/15 1343  AST 22  ALT 21  ALKPHOS 70  BILITOT 0.8  PROT 6.3*  ALBUMIN 3.2*   CBC:  Recent Labs Lab 05/09/15 1343 05/11/15 0420  WBC 6.3 8.4  NEUTROABS 4.9  --   HGB 10.6* 8.6*  HCT 33.0* 26.3*  MCV 100.0 98.1  PLT 44* 48*   Cardiac Enzymes:  Recent Labs Lab 05/09/15 1343  TROPONINI <0.03   BNP: BNP (last 3 results)  Recent Labs  10/30/14 1300  BNP 55.3   CBG:  Recent Labs Lab 05/10/15 2024 05/10/15 2339 05/11/15 0408 05/11/15 0728 05/11/15 1200  GLUCAP 184* 269* 203* 162* 262*     SIGNED: Time coordinating discharge: 30 minutes  MAGICK-Charan Prieto, MD  [facility] 05/11/2015, 2:03 PM Pager [phone]  If 7PM-7AM, please contact night-coverage [email or web address] Password [email or web address]

## 2015-05-11 NOTE — Discharge Instructions (Signed)
Confusion Confusion is the inability to think with your usual speed or clarity. Confusion may come on quickly or slowly over time. How quickly the confusion comes on depends on the cause. Confusion can be due to any number of causes. CAUSES   Concussion, head injury, or head trauma.  Seizures.  Stroke.  Fever.  Brain tumor.  Age related decreased brain function (dementia).  Heightened emotional states like rage or terror.  Mental illness in which the person loses the ability to determine what is real and what is not (hallucinations).  Infections such as a urinary tract infection (UTI).  Toxic effects from alcohol, drugs, or prescription medicines.  Dehydration and an imbalance of salts in the body (electrolytes).  Lack of sleep.  Low blood sugar (diabetes).  Low levels of oxygen from conditions such as chronic lung disorders.  Drug interactions or other medicine side effects.  Nutritional deficiencies, especially niacin, thiamine, vitamin C, or vitamin B.  Sudden drop in body temperature (hypothermia).  Change in routine, such as when traveling or hospitalized. SIGNS AND SYMPTOMS  People often describe their thinking as cloudy or unclear when they are confused. Confusion can also include feeling disoriented. That means you are unaware of where or who you are. You may also not know what the date or time is. If confused, you may also have difficulty paying attention, remembering, and making decisions. Some people also act aggressively when they are confused.  DIAGNOSIS  The medical evaluation of confusion may include:  Blood and urine tests.  X-rays.  Brain and nervous system tests.  Analyzing your brain waves (electroencephalogram or EEG).  Magnetic resonance imaging (MRI) of your head.  Computed tomography (CT) scan of your head.  Mental status tests in which your health care provider may ask many questions. Some of these questions may seem silly or strange,  but they are a very important test to help diagnose and treat confusion. TREATMENT  An admission to the hospital may not be needed, but a person with confusion should not be left alone. Stay with a family member or friend until the confusion clears. Avoid alcohol, pain relievers, or sedative drugs until you have fully recovered. Do not drive until directed by your health care provider. HOME CARE INSTRUCTIONS  What family and friends can do:  To find out if someone is confused, ask the person to state his or her name, age, and the date. If the person is unsure or answers incorrectly, he or she is confused.  Always introduce yourself, no matter how well the person knows you.  Often remind the person of his or her location.  Place a calendar and clock near the confused person.  Help the person with his or her medicines. You may want to use a pill box, an alarm as a reminder, or give the person each dose as prescribed.  Talk about current events and plans for the day.  Try to keep the environment calm, quiet, and peaceful.  Make sure the person keeps follow-up visits with his or her health care provider. PREVENTION  Ways to prevent confusion:  Avoid alcohol.  Eat a balanced diet.  Get enough sleep.  Take medicine only as directed by your health care provider.  Do not become isolated. Spend time with other people and make plans for your days.  Keep careful watch on your blood sugar levels if you are diabetic. SEEK IMMEDIATE MEDICAL CARE IF:   You develop severe headaches, repeated vomiting, seizures, blackouts, or   slurred speech.  There is increasing confusion, weakness, numbness, restlessness, or personality changes.  You develop a loss of balance, have marked dizziness, feel uncoordinated, or fall.  You have delusions, hallucinations, or develop severe anxiety.  Your family members think you need to be rechecked.   This information is not intended to replace advice given  to you by your health care provider. Make sure you discuss any questions you have with your health care provider.   Document Released: 08/28/2004 Document Revised: 08/11/2014 Document Reviewed: 08/26/2013 Elsevier Interactive Patient Education 2016 [employer]  

## 2015-05-11 NOTE — Progress Notes (Signed)
Patient o2 sats sustained from 92-95% while ambulating.  Patient tolerated walk well.  HR sustaining around 115 with exertion, MD aware.

## 2015-05-14 ENCOUNTER — Telehealth: Payer: Self-pay | Admitting: *Deleted

## 2015-05-14 ENCOUNTER — Other Ambulatory Visit: Payer: Self-pay | Admitting: Hematology and Oncology

## 2015-05-14 ENCOUNTER — Other Ambulatory Visit: Payer: Self-pay | Admitting: *Deleted

## 2015-05-14 ENCOUNTER — Telehealth: Payer: Self-pay | Admitting: Hematology and Oncology

## 2015-05-14 DIAGNOSIS — C50412 Malignant neoplasm of upper-outer quadrant of left female breast: Secondary | ICD-10-CM

## 2015-05-14 NOTE — Telephone Encounter (Signed)
CALLED PATIENT TO INFORM OF MRI AND FU, LVM FOR A RETURN CALL 

## 2015-05-14 NOTE — Telephone Encounter (Signed)
10/12 appointments cancelled per pof  anne

## 2015-05-14 NOTE — Telephone Encounter (Signed)
Patients sister stopped by to add on a hosp follow up per dr Loleta Dicker

## 2015-05-16 ENCOUNTER — Other Ambulatory Visit: Payer: Medicare Other

## 2015-05-16 ENCOUNTER — Encounter: Payer: Self-pay | Admitting: Surgery

## 2015-05-16 ENCOUNTER — Ambulatory Visit (INDEPENDENT_AMBULATORY_CARE_PROVIDER_SITE_OTHER): Payer: BLUE CROSS/BLUE SHIELD | Admitting: Surgery

## 2015-05-16 ENCOUNTER — Ambulatory Visit
Admission: RE | Admit: 2015-05-16 | Discharge: 2015-05-16 | Disposition: A | Discharge status: Home / Self Care | Payer: BLUE CROSS/BLUE SHIELD | Source: Ambulatory Visit | Attending: Surgery | Admitting: Surgery

## 2015-05-16 ENCOUNTER — Ambulatory Visit: Payer: Medicare Other

## 2015-05-16 VITALS — BP 123/80 | HR 92 | Resp 18 | Ht 64.0 in | Wt 169.0 lb

## 2015-05-16 DIAGNOSIS — J910 Malignant pleural effusion: Secondary | ICD-10-CM | POA: Diagnosis not present

## 2015-05-16 DIAGNOSIS — Z853 Personal history of malignant neoplasm of breast: Secondary | ICD-10-CM | POA: Diagnosis not present

## 2015-05-16 DIAGNOSIS — C7800 Secondary malignant neoplasm of unspecified lung: Secondary | ICD-10-CM

## 2015-05-16 NOTE — Progress Notes (Signed)
HPI:  She returns today for follow up of a recurrent malignant right pleural effusion treated with talc pleurodesis in early June 2016. She developed some recurrent loculated right pleural effusion after the PleurX catheter was removed and had a right thoracentesis removing only 150 cc of fluid. A CT of the chest on 01/31/2015 that showed a decrease in the right hilar mass and left pulmonary nodular mets. There was also a decrease in the size of the loculated right pleural effusion. She says that she is now breathing well with no shortness of breath. She has had problems with brain mets and memory disturbance which have improved on Prednisone.  Current Outpatient Prescriptions  Medication Sig Dispense Refill  . cetirizine (ZYRTEC) 10 MG tablet Take 10 mg by mouth daily as needed for allergies.     . cholecalciferol (VITAMIN D) 1000 UNITS tablet Take 1,000 Units by mouth every morning.     Marland Kitchen dexamethasone (DECADRON) 4 MG tablet Continue taking 4 mg tablet three time daily for one week until October 13th, 2016 and starting October 14th, 2016 start taking 4 mg tablet twice daily for one week. Dr. Lindi Adie will see patient within next two weeks and will continue to taper down. 40 tablet 0  . diltiazem (CARDIZEM CD) 120 MG 24 hr capsule Take 1 capsule (120 mg total) by mouth every morning. 90 capsule 1  . esomeprazole (NEXIUM) 20 MG capsule Take 20 mg by mouth daily as needed (acid).     . fluticasone (FLONASE) 50 MCG/ACT nasal spray Place 2 sprays into both nostrils daily as needed for allergies or rhinitis.    Marland Kitchen insulin aspart (NOVOLOG) 100 UNIT/ML injection Inject 0-9 Units into the skin 3 (three) times daily with meals. 10 mL 5  . insulin glargine (LANTUS) 100 UNIT/ML injection Inject 0.08 mLs (8 Units total) into the skin at bedtime. 10 mL 11  . KLOR-CON M20 20 MEQ tablet TAKE 1 TABLET (20 MEQ TOTAL) BY MOUTH 2 (TWO) TIMES DAILY. 60 tablet 0  . latanoprost (XALATAN) 0.005 % ophthalmic solution  Place 1 drop into both eyes at bedtime.     Marland Kitchen levothyroxine (SYNTHROID, LEVOTHROID) 50 MCG tablet Take 1 tablet (50 mcg total) by mouth daily. 90 tablet 3  . lidocaine-prilocaine (EMLA) cream Apply to affected area once (Patient taking differently: Apply 1 application topically as needed (for chemo). Apply to affected area once) 30 g 3  . LORazepam (ATIVAN) 0.5 MG tablet Take 1 tablet (0.5 mg total) by mouth every 6 (six) hours as needed (Nausea or vomiting). 30 tablet 0  . metFORMIN (GLUCOPHAGE) 500 MG tablet Take 1000mg  (2 tabs) in the morning and 500mg  (1 tab) in the evening with meals 270 tablet 3  . nystatin (MYCOSTATIN) 100000 UNIT/ML suspension TAKE 5 MLS (500,000 UNITS TOTAL) BY MOUTH 2 (TWO) TIMES DAILY. 60 mL 0  . ondansetron (ZOFRAN) 8 MG tablet Take 1 tablet (8 mg total) by mouth every 8 (eight) hours as needed for nausea or vomiting. 30 tablet 1  . oxyCODONE (OXY IR/ROXICODONE) 5 MG immediate release tablet Take 1 tablet (5 mg total) by mouth every 4 (four) hours as needed for moderate pain. 30 tablet 0  . PRESCRIPTION MEDICATION Chemo    . prochlorperazine (COMPAZINE) 10 MG tablet Take 1 tablet (10 mg total) by mouth every 6 (six) hours as needed (Nausea or vomiting). 30 tablet 1  . Tetrahydrozoline HCl (VISINE OP) Apply 1-2 drops to eye daily as needed (itchy  eyes.).    Marland Kitchen timolol (TIMOPTIC) 0.5 % ophthalmic solution Place 1 drop into both eyes 2 (two) times daily.     No current facility-administered medications for this visit.     Physical Exam: BP 123/80 mmHg  Pulse 92  Resp 18  Ht 5\' 4"  (1.626 m)  Wt 169 lb (76.658 kg)  BMI 28.99 kg/m2  SpO2 97% She looks well but is puffy from the steroids Lung exam shows slight decreased breath sounds at the right base.  Diagnostic Tests:  CLINICAL DATA: Malignant neoplasm.  EXAM: CHEST 2 VIEW  COMPARISON: 05/09/2015.  FINDINGS: Prior poor catheter stable position. Mediastinum hilar structures normal. Cardiomegaly with  interim improvement pulmonary venous congestion and pulmonary interstitial prominence suggesting clearing congestive heart failure. Stable right pleural effusion. Left mastectomy.  IMPRESSION: 1. Prior poor catheter stable position. 2. Stable right pleural effusion. 3. Interim clearing of congestive heart failure and pulmonary interstitial edema.   Electronically Signed  By: Marcello Moores Register  On: 05/16/2015 09:57   Impression:  Her CXR looks good and I suspect that the pleurodesis has become fully effective with resolution of the remaining effusion.   Plan:  She will continue follow up with Dr. Lindi Adie.   Gaye Pollack, MD [facility] [phone]

## 2015-05-23 ENCOUNTER — Ambulatory Visit: Payer: BLUE CROSS/BLUE SHIELD | Admitting: Hematology and Oncology

## 2015-05-23 ENCOUNTER — Other Ambulatory Visit: Payer: BLUE CROSS/BLUE SHIELD

## 2015-05-23 ENCOUNTER — Ambulatory Visit: Payer: BLUE CROSS/BLUE SHIELD

## 2015-05-24 ENCOUNTER — Ambulatory Visit: Payer: Self-pay | Admitting: Radiation Oncology

## 2015-05-24 ENCOUNTER — Other Ambulatory Visit: Payer: Self-pay | Admitting: Family Medicine

## 2015-05-24 NOTE — Assessment & Plan Note (Signed)
09/30/2012 :Stage III, triple negative, invasive ductal carcinoma of the left breast. Neoadjuvant chemotherapy following the surgery followed by adjuvant radiation with concurrent Xeloda. 3.9 cm size and 9/13 lymph nodes were positive with extracapsular extension. ER/PR negative HER-2 negative Ki-67 21% March 2016: CT chest revealed bilateral lung nodules biopsy-proven to be triple negative metastatic carcinoma , pleural effusion status post thoracentesis malignant cells on cytology, repeat thoracentesis 12/01/2014 and 12/12/2014 PET CT scan 11/27/2014 showed widespread metastatic disease to the thorax with multiple pulmonary nodules and masses and malignant right pleural effusion and right hilar and mediastinal malignant lymph nodes. Brain MRI 04/03/2015: Widespread intracranial metastases involving both cerebral hemispheres and cerebellum status post whole brain radiation PET/CT 04/24/2015 Previously described extensive pulmonary and right pleural nodularity has improved largest lung nodule right lower lobe 2.4 cm, right middle lobe 12 mm, no activity left lung, right pleural effusion resolved. No bone or liver metastases. -------------------------------------------------------------------------------------------------------------------------- Current treatment: Completed Cycle 7 Halaven (changed to every 2 weeks) Diabetes mellitus: On insulin Thrush: Improved with nystatin, prescribed more nystatin for her today. 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 In addition to chemotherapy  Hospitalization for mental confusion: MRI showing new metastatic lesions in the brain.? Pseudo-progression. Resume her dexamethasone. During the hospitalization prolonged thrombocytopenia  Brain metastases: Plan is to recheck with another MRI by Dr. Pablo Ledger.

## 2015-05-25 ENCOUNTER — Ambulatory Visit (HOSPITAL_BASED_OUTPATIENT_CLINIC_OR_DEPARTMENT_OTHER): Payer: BLUE CROSS/BLUE SHIELD | Admitting: Hematology and Oncology

## 2015-05-25 ENCOUNTER — Telehealth: Payer: Self-pay

## 2015-05-25 ENCOUNTER — Encounter: Payer: Self-pay | Admitting: Hematology and Oncology

## 2015-05-25 ENCOUNTER — Ambulatory Visit (HOSPITAL_BASED_OUTPATIENT_CLINIC_OR_DEPARTMENT_OTHER): Payer: BLUE CROSS/BLUE SHIELD

## 2015-05-25 ENCOUNTER — Ambulatory Visit: Payer: BLUE CROSS/BLUE SHIELD | Admitting: Family Medicine

## 2015-05-25 ENCOUNTER — Other Ambulatory Visit (HOSPITAL_BASED_OUTPATIENT_CLINIC_OR_DEPARTMENT_OTHER): Payer: BLUE CROSS/BLUE SHIELD

## 2015-05-25 ENCOUNTER — Telehealth: Payer: Self-pay | Admitting: Hematology and Oncology

## 2015-05-25 ENCOUNTER — Ambulatory Visit: Payer: Medicare Other | Admitting: Family Medicine

## 2015-05-25 VITALS — BP 119/65 | HR 99 | Temp 97.7°F | Resp 18 | Ht 64.0 in | Wt 168.0 lb

## 2015-05-25 DIAGNOSIS — C7931 Secondary malignant neoplasm of brain: Secondary | ICD-10-CM

## 2015-05-25 DIAGNOSIS — Z171 Estrogen receptor negative status [ER-]: Secondary | ICD-10-CM

## 2015-05-25 DIAGNOSIS — Z95828 Presence of other vascular implants and grafts: Secondary | ICD-10-CM

## 2015-05-25 DIAGNOSIS — C50412 Malignant neoplasm of upper-outer quadrant of left female breast: Secondary | ICD-10-CM

## 2015-05-25 DIAGNOSIS — C50912 Malignant neoplasm of unspecified site of left female breast: Principal | ICD-10-CM

## 2015-05-25 DIAGNOSIS — C7801 Secondary malignant neoplasm of right lung: Secondary | ICD-10-CM | POA: Diagnosis not present

## 2015-05-25 DIAGNOSIS — C50911 Malignant neoplasm of unspecified site of right female breast: Secondary | ICD-10-CM

## 2015-05-25 LAB — CBC WITH DIFFERENTIAL/PLATELET
BASO%: 0.3 % (ref 0.0–2.0)
Basophils Absolute: 0 10*3/uL (ref 0.0–0.1)
EOS ABS: 0 10*3/uL (ref 0.0–0.5)
EOS%: 0 % (ref 0.0–7.0)
HCT: 37.9 % (ref 34.8–46.6)
HEMOGLOBIN: 12.3 g/dL (ref 11.6–15.9)
LYMPH%: 4.4 % — ABNORMAL LOW (ref 14.0–49.7)
MCH: 32 pg (ref 25.1–34.0)
MCHC: 32.4 g/dL (ref 31.5–36.0)
MCV: 98.6 fL (ref 79.5–101.0)
MONO#: 0.8 10*3/uL (ref 0.1–0.9)
MONO%: 6.7 % (ref 0.0–14.0)
NEUT#: 10.4 10*3/uL — ABNORMAL HIGH (ref 1.5–6.5)
NEUT%: 88.6 % — ABNORMAL HIGH (ref 38.4–76.8)
Platelets: 149 10*3/uL (ref 145–400)
RBC: 3.84 10*6/uL (ref 3.70–5.45)
RDW: 18.1 % — AB (ref 11.2–14.5)
WBC: 11.8 10*3/uL — AB (ref 3.9–10.3)
lymph#: 0.5 10*3/uL — ABNORMAL LOW (ref 0.9–3.3)

## 2015-05-25 LAB — COMPREHENSIVE METABOLIC PANEL (CC13)
ALBUMIN: 3.2 g/dL — AB (ref 3.5–5.0)
ALK PHOS: 75 U/L (ref 40–150)
ALT: 22 U/L (ref 0–55)
AST: 12 U/L (ref 5–34)
Anion Gap: 13 mEq/L — ABNORMAL HIGH (ref 3–11)
BILIRUBIN TOTAL: 0.6 mg/dL (ref 0.20–1.20)
BUN: 29 mg/dL — AB (ref 7.0–26.0)
CO2: 21 meq/L — AB (ref 22–29)
CREATININE: 0.8 mg/dL (ref 0.6–1.1)
Calcium: 9.3 mg/dL (ref 8.4–10.4)
Chloride: 102 mEq/L (ref 98–109)
GLUCOSE: 195 mg/dL — AB (ref 70–140)
Potassium: 4.2 mEq/L (ref 3.5–5.1)
SODIUM: 135 meq/L — AB (ref 136–145)
TOTAL PROTEIN: 5.7 g/dL — AB (ref 6.4–8.3)

## 2015-05-25 MED ORDER — SODIUM CHLORIDE 0.9 % IJ SOLN
10.0000 mL | INTRAMUSCULAR | Status: DC | PRN
  Administered 2015-05-25: 10 mL via INTRAVENOUS
  Filled 2015-05-25: qty 10

## 2015-05-25 MED ORDER — HYDROMORPHONE HCL 2 MG PO TABS: 1.0000 mg | ORAL_TABLET | Freq: Four times a day (QID) | ORAL | Status: AC | PRN

## 2015-05-25 MED ORDER — HEPARIN SOD (PORK) LOCK FLUSH 100 UNIT/ML IV SOLN
500.0000 [IU] | Freq: Once | INTRAVENOUS | Status: AC
  Administered 2015-05-25: 500 [IU] via INTRAVENOUS
  Filled 2015-05-25: qty 5

## 2015-05-25 NOTE — Addendum Note (Signed)
Addended by: Prentiss Bells on: 05/25/2015 01:50 PM   Modules accepted: Orders, Medications

## 2015-05-25 NOTE — Progress Notes (Signed)
Patient Care Team: Lucretia Kern, DO as PCP - General (Family Medicine)  DIAGNOSIS: Breast cancer of upper-outer quadrant of left female breast [facility])   Staging form: Breast, AJCC 7th Edition     Clinical: Stage IIIB (T4, N1, cM0) - Signed by Thea Silversmith, MD on 10/29/2012       Prognostic indicators: ER-, PR- HER2 -      Pathologic: No stage assigned - Unsigned       Prognostic indicators: ER-, PR- HER2 -    SUMMARY OF ONCOLOGIC HISTORY:   Breast cancer of upper-outer quadrant of left female breast ([place])   10/06/1986 Initial Diagnosis Right breast mastectomy, breast cancer diagnosed during pregnancy, no adjuvant treatment given   09/30/2012 Initial Biopsy Left breast biopsy done for left nipple retraction: Invasive ductal carcinoma grade 3, ER/PR negative, HER-2 negative, Ki-67 21% with grade 3 DCIS   10/12/2012 Breast MRI Left breast diffuse enhancement measuring 13 x 12.5 x 9.5 cm with diffuse skin thickening and dermal enhancement, multiple enlarged level I left axillary lymph nodes largest 2.4 cm   11/02/2012 - 06/06/2013 Neo-Adjuvant Chemotherapy Neoadjuvant FEC 4 followed by Taxol carbo 8 discontinued for neuropathy, Gemzar Carbo 2   09/15/2013 - 11/01/2013 Radiation Therapy Xeloda with adjuvant radiation   10/30/2014 Imaging Right hilar node 1.6 cm, subcarinal 1.2 cm, 1 cm additional right hilar, right lower lobe 3.2 cm, RUL 1 cm, and LDL of 1.3 cm, moderate pleural effusion, right liver 0.8 cm, 15 cm fluid collection left chest wall   10/31/2014 Initial Biopsy Lung biopsy right lower lobe: Carcinoma with Extensive necrosis, microscopic foci of nonnecrotic non-small cell, poorly differentiated carcinoma, metastatic disease likely ER 0%, PR 0%, HER-2 negative   12/01/2014 Procedure Second thoracentesis for malignant pleural effusion   12/06/2014 -  Chemotherapy Halaven day 1 day 8 every 3 weeks   02/11/2015 - 02/13/2015 Hospital Admission Hospitalization for gastroenteritis, accompanied by nausea  vomiting dehydration and hypokalemia   04/06/2015 Relapse/Recurrence Brain MRI showing diffuse bilateral brain metastasis   04/06/2015 - 04/25/2015 Radiation Therapy Palliative radiation therapy to the brain 15 fractions   04/24/2015 PET scan Previously described extensive pulmonary and right pleural nodularity has improved largest lung nodule right lower lobe 2.4 cm, right middle lobe 12 mm, no activity left lung, right pleural effusion resolved. No bone or liver metastases    CHIEF COMPLIANT: Follow-up after discharge from hospital  INTERVAL HISTORY: Christine [name] is a 59 year old with above-mentioned history of metastatic breast cancer who was hospitalized with acute mental status changes and was found to have multiple new spots in the brain. Dr. Pablo Ledger and I reviewed her scans and it was felt that these changes could be a progression. She was planned to undergo another brain MRI and follow-up in November with Dr. Pablo Ledger. Meanwhile she has been on oral dexamethasone in her mental status has completely come back to the baseline. However the steroids are causing weight gain issues and swelling of the face. She is also extremely weak in her legs. She is however able to walk with the help of support and using a cane.  REVIEW OF SYSTEMS:   Constitutional: Denies fevers, chills or abnormal weight loss Eyes: Denies blurriness of vision Ears, nose, mouth, throat, and face: Puffiness of face Respiratory: Denies cough, dyspnea or wheezes Cardiovascular: Denies palpitation, chest discomfort or lower extremity swelling Gastrointestinal:  Denies nausea, heartburn or change in bowel habits Skin: Denies abnormal skin rashes Lymphatics: Denies new lymphadenopathy or easy bruising Neurological:Denies numbness,  tingling or new weaknesses Behavioral/Psych: Mood is stable, no new changes  All other systems were reviewed with the patient and are negative.  I have reviewed the past medical history, past  surgical history, social history and family history with the patient and they are unchanged from previous note.  ALLERGIES:  is allergic to sulfa antibiotics; sulfonamide derivatives; and tomato.  MEDICATIONS:  Current Outpatient Prescriptions  Medication Sig Dispense Refill  . cetirizine (ZYRTEC) 10 MG tablet Take 10 mg by mouth daily as needed for allergies.     . cholecalciferol (VITAMIN D) 1000 UNITS tablet Take 1,000 Units by mouth every morning.     Marland Kitchen dexamethasone (DECADRON) 4 MG tablet Continue taking 4 mg tablet three time daily for one week until October 13th, 2016 and starting October 14th, 2016 start taking 4 mg tablet twice daily for one week. Dr. Lindi Adie will see patient within next two weeks and will continue to taper down. 40 tablet 0  . diltiazem (CARDIZEM CD) 120 MG 24 hr capsule Take 1 capsule (120 mg total) by mouth every morning. 90 capsule 1  . esomeprazole (NEXIUM) 20 MG capsule Take 20 mg by mouth daily as needed (acid).     . fluticasone (FLONASE) 50 MCG/ACT nasal spray Place 2 sprays into both nostrils daily as needed for allergies or rhinitis.    Marland Kitchen HYDROmorphone (DILAUDID) 2 MG tablet Take 0.5 tablets (1 mg total) by mouth every 6 (six) hours as needed for severe pain. 30 tablet 0  . insulin aspart (NOVOLOG) 100 UNIT/ML injection Inject 0-9 Units into the skin 3 (three) times daily with meals. 10 mL 5  . insulin glargine (LANTUS) 100 UNIT/ML injection Inject 0.08 mLs (8 Units total) into the skin at bedtime. 10 mL 11  . KLOR-CON M20 20 MEQ tablet TAKE 1 TABLET (20 MEQ TOTAL) BY MOUTH 2 (TWO) TIMES DAILY. 60 tablet 0  . latanoprost (XALATAN) 0.005 % ophthalmic solution Place 1 drop into both eyes at bedtime.     Marland Kitchen levothyroxine (SYNTHROID, LEVOTHROID) 50 MCG tablet Take 1 tablet (50 mcg total) by mouth daily. 90 tablet 3  . lidocaine-prilocaine (EMLA) cream Apply to affected area once (Patient taking differently: Apply 1 application topically as needed (for chemo). Apply  to affected area once) 30 g 3  . LORazepam (ATIVAN) 0.5 MG tablet Take 1 tablet (0.5 mg total) by mouth every 6 (six) hours as needed (Nausea or vomiting). 30 tablet 0  . metFORMIN (GLUCOPHAGE) 500 MG tablet Take $RemoveBef'1000mg'[id number]$  (2 tabs) in the morning and $RemoveBef'500mg'UgKXaIMcAc$  (1 tab) in the evening with meals 270 tablet 3  . nystatin (MYCOSTATIN) 100000 UNIT/ML suspension TAKE 5 MLS (500,000 UNITS TOTAL) BY MOUTH 2 (TWO) TIMES DAILY. 60 mL 0  . ondansetron (ZOFRAN) 8 MG tablet Take 1 tablet (8 mg total) by mouth every 8 (eight) hours as needed for nausea or vomiting. 30 tablet 1  . ONETOUCH VERIO test strip USE AS INSTRUCTED TO CHECK BLOOD SUGAR ONCE A DAY 100 each 1  . PRESCRIPTION MEDICATION Chemo    . prochlorperazine (COMPAZINE) 10 MG tablet Take 1 tablet (10 mg total) by mouth every 6 (six) hours as needed (Nausea or vomiting). 30 tablet 1  . Tetrahydrozoline HCl (VISINE OP) Apply 1-2 drops to eye daily as needed (itchy eyes.).    Marland Kitchen timolol (TIMOPTIC) 0.5 % ophthalmic solution Place 1 drop into both eyes 2 (two) times daily.     No current facility-administered medications for this visit.  PHYSICAL EXAMINATION: ECOG PERFORMANCE STATUS: 2 - Symptomatic, <50% confined to bed  Filed Vitals:   05/25/15 1230  BP: 119/65  Pulse: 99  Temp: 97.7 F (36.5 C)  Resp: 18   Filed Weights   05/25/15 1230  Weight: 168 lb (76.204 kg)    GENERAL:alert, no distress and comfortable SKIN: skin color, texture, turgor are normal, no rashes or significant lesions EYES: normal, Conjunctiva are pink and non-injected, sclera clear OROPHARYNX:no exudate, no erythema and lips, buccal mucosa, and tongue normal  NECK: supple, thyroid normal size, non-tender, without nodularity LYMPH:  no palpable lymphadenopathy in the cervical, axillary or inguinal LUNGS: clear to auscultation and percussion with normal breathing effort HEART: regular rate & rhythm and no murmurs and no lower extremity edema ABDOMEN:abdomen soft,  non-tender and normal bowel sounds Musculoskeletal:no cyanosis of digits and no clubbing  NEURO: Lower extremity weakness  LABORATORY DATA:  I have reviewed the data as listed   Chemistry      Component Value Date/Time   NA 143 05/11/2015 0420   NA 140 05/02/2015 1109   K 3.9 05/11/2015 0420   K 3.4* 05/02/2015 1109   CL 110 05/11/2015 0420   CL 101 01/25/2013 0952   CO2 28 05/11/2015 0420   CO2 25 05/02/2015 1109   BUN 17 05/11/2015 0420   BUN 20.1 05/02/2015 1109   CREATININE 0.64 05/11/2015 0420   CREATININE 0.8 05/02/2015 1109      Component Value Date/Time   CALCIUM 6.8* 05/11/2015 0420   CALCIUM 8.8 05/02/2015 1109   ALKPHOS 70 05/09/2015 1343   ALKPHOS 67 05/02/2015 1109   AST 22 05/09/2015 1343   AST 21 05/02/2015 1109   ALT 21 05/09/2015 1343   ALT 23 05/02/2015 1109   BILITOT 0.8 05/09/2015 1343   BILITOT 0.59 05/02/2015 1109       Lab Results  Component Value Date   WBC 11.8* 05/25/2015   HGB 12.3 05/25/2015   HCT 37.9 05/25/2015   MCV 98.6 05/25/2015   PLT 149 05/25/2015   NEUTROABS 10.4* 05/25/2015   ASSESSMENT & PLAN:  Breast cancer of upper-outer quadrant of left female breast ([place]) 09/30/2012 :Stage III, triple negative, invasive ductal carcinoma of the left breast. Neoadjuvant chemotherapy following the surgery followed by adjuvant radiation with concurrent Xeloda. 3.9 cm size and 9/13 lymph nodes were positive with extracapsular extension. ER/PR negative HER-2 negative Ki-67 21% March 2016: CT chest revealed bilateral lung nodules biopsy-proven to be triple negative metastatic carcinoma , pleural effusion status post thoracentesis malignant cells on cytology, repeat thoracentesis 12/01/2014 and 12/12/2014 PET CT scan 11/27/2014 showed widespread metastatic disease to the thorax with multiple pulmonary nodules and masses and malignant right pleural effusion and right hilar and mediastinal malignant lymph nodes. Brain MRI 04/03/2015: Widespread  intracranial metastases involving both cerebral hemispheres and cerebellum status post whole brain radiation PET/CT 04/24/2015 Previously described extensive pulmonary and right pleural nodularity has improved largest lung nodule right lower lobe 2.4 cm, right middle lobe 12 mm, no activity left lung, right pleural effusion resolved. No bone or liver metastases. -------------------------------------------------------------------------------------------------------------------------- Current treatment: Completed Cycle 7 Halaven (changed to every 2 weeks) Diabetes mellitus: On insulin Thrush: Improved with nystatin Thrombocytopenia: Probably related to prior radiation. In addition to chemotherapy, resolved as of 05/25/2015  Hospitalization for mental confusion: MRI showing new metastatic lesions in the brain.? Pseudo-progression. Resume her dexamethasone. During the hospitalization prolonged thrombocytopenia  Brain metastases: Plan is to recheck with another MRI by Dr. Pablo Ledger. Systemic therapy  plan: We would like to hold off on giving more systemic chemotherapy because she is still fairly weak and frail. Our plan is to obtain a CT of the chest abdomen pelvis and see her back in 2 months. If she clinically and physically progresses and she cannot make these Appointments, then we will recommend hospice care. I discussed with her that the goal of treatment is palliation and symptom management.  Leg weakness: Related to hospitalization and deconditioning. She was able to walk with assistance using a cane.  Return to clinic in 2 months for follow-up after scans.  Orders Placed This Encounter  Procedures  . CT Chest W Contrast    Standing Status: Future     Number of Occurrences:      Standing Expiration Date: 05/24/2016    Order Specific Question:  Reason for Exam (SYMPTOM  OR DIAGNOSIS REQUIRED)    Answer:  Metastatic breast cancer restaging    Order Specific Question:  Is the patient  pregnant?    Answer:  No    Order Specific Question:  Preferred imaging location?    Answer:  [facility]  . CT Abdomen Pelvis W Contrast    Standing Status: Future     Number of Occurrences:      Standing Expiration Date: 05/24/2016    Order Specific Question:  Reason for Exam (SYMPTOM  OR DIAGNOSIS REQUIRED)    Answer:  Metastatic breast cancer restaging    Order Specific Question:  Is the patient pregnant?    Answer:  No    Order Specific Question:  Preferred imaging location?    Answer:  [facility]   The patient has a good understanding of the overall plan. she agrees with it. she will call with any problems that may develop before the next visit here.   Rulon Eisenmenger, MD 05/25/2015

## 2015-05-25 NOTE — Telephone Encounter (Signed)
Appointments made and avs printed for patient °

## 2015-05-27 ENCOUNTER — Other Ambulatory Visit: Payer: Self-pay | Admitting: Family Medicine

## 2015-05-28 ENCOUNTER — Other Ambulatory Visit: Payer: Self-pay | Admitting: *Deleted

## 2015-05-28 ENCOUNTER — Telehealth: Payer: Self-pay | Admitting: *Deleted

## 2015-05-28 DIAGNOSIS — C50412 Malignant neoplasm of upper-outer quadrant of left female breast: Secondary | ICD-10-CM

## 2015-05-28 MED ORDER — DEXAMETHASONE 2 MG PO TABS: 2.0000 mg | ORAL_TABLET | Freq: Two times a day (BID) | ORAL | Status: DC

## 2015-05-28 NOTE — Telephone Encounter (Signed)
Patient called to ask about dose of Decadron. Patient advised to take 1/2 tab (2 mg) Bid. Prescription for 2 mg tablets called in to pharmacy. Patient verbalized understanding.

## 2015-05-30 ENCOUNTER — Ambulatory Visit: Payer: Medicare Other

## 2015-05-30 ENCOUNTER — Other Ambulatory Visit: Payer: BLUE CROSS/BLUE SHIELD

## 2015-05-30 ENCOUNTER — Ambulatory Visit: Payer: Medicare Other | Admitting: Hematology and Oncology

## 2015-05-30 ENCOUNTER — Other Ambulatory Visit: Payer: Medicare Other

## 2015-05-30 ENCOUNTER — Ambulatory Visit: Payer: BLUE CROSS/BLUE SHIELD

## 2015-05-31 ENCOUNTER — Ambulatory Visit (INDEPENDENT_AMBULATORY_CARE_PROVIDER_SITE_OTHER): Payer: BLUE CROSS/BLUE SHIELD | Admitting: Family Medicine

## 2015-05-31 ENCOUNTER — Encounter: Payer: Self-pay | Admitting: Family Medicine

## 2015-05-31 VITALS — BP 116/80 | HR 113 | Temp 98.1°F | Ht 64.0 in

## 2015-05-31 DIAGNOSIS — C50412 Malignant neoplasm of upper-outer quadrant of left female breast: Secondary | ICD-10-CM | POA: Diagnosis not present

## 2015-05-31 DIAGNOSIS — Z794 Long term (current) use of insulin: Secondary | ICD-10-CM

## 2015-05-31 DIAGNOSIS — K649 Unspecified hemorrhoids: Secondary | ICD-10-CM

## 2015-05-31 DIAGNOSIS — E118 Type 2 diabetes mellitus with unspecified complications: Secondary | ICD-10-CM

## 2015-05-31 MED ORDER — INSULIN GLARGINE 100 UNIT/ML ~~LOC~~ SOLN: 10.0000 [IU] | Freq: Every day | SUBCUTANEOUS | Status: DC

## 2015-05-31 MED ORDER — INSULIN ASPART 100 UNIT/ML ~~LOC~~ SOLN: 5.0000 [IU] | Freq: Three times a day (TID) | SUBCUTANEOUS | Status: DC

## 2015-05-31 MED ORDER — GLUCOSE BLOOD VI STRP: ORAL_STRIP | Status: AC

## 2015-05-31 MED ORDER — HYDROCORTISONE 2.5 % RE CREA: 1.0000 "application " | TOPICAL_CREAM | Freq: Two times a day (BID) | RECTAL | Status: AC

## 2015-05-31 NOTE — Addendum Note (Signed)
Addended by: Agnes Lawrence on: 05/31/2015 02:11 PM   Modules accepted: Orders

## 2015-05-31 NOTE — Addendum Note (Signed)
Addended by: Agnes Lawrence on: 05/31/2015 02:02 PM   Modules accepted: Orders, Medications

## 2015-05-31 NOTE — Patient Instructions (Signed)
  Use 10 units of lantus nightly  Use 5 units of aspart (novolog) insulin right BEFORE each meal  Check Sugars twice daily and if you feel bad: 1) FASTING in the morning before you eat 2) Alternate: 1-2 hours AFTER breakfast, next day 1-2 ours after lunch, next day 1-2 hours after dinner KEEP log and call in 3-5 days with these values and we will help adjust your insulin Call immediately if any low blood sugars < 70

## 2015-05-31 NOTE — Progress Notes (Signed)
HPI:  Christine [name] is a very pleasant 59 yo F patient with metastatic breast ca here for a hospital follow up/transition of care visit.  Hospitalized 10/5-10/7/16 for AMS: -found to have progressive brain metastasis w/ acute encephalopathy/expressive aphasia -complicated by chemotherapy induced thrombocytopenia, anemia of chronic dz w/out active bleeding, acute on chronic respiratory failure with hypoxia felt 2ndary to lung metastasis and recurrent pleural effusion -tx with corticosteroids - discharged on decadron taper -saw CTS since and seems to have had good resolution of pleural effusion per notes, also saw oncologist a few days ago and per notes tx is symptomatic and at this time chemo is being held due to frailty and weakness. Per notes if continues to decline they plan to transition to hospice care. -she had labs a few days ago with her oncologist and anemia and thrombocytopenia had resolved. -today reports: she is doing well, breathing is good, pain is controlled and mood is good despite this, she has strong faith, reports using SSI and checking sugars 4 times per day which is complicated and burdensome and reports BS in high 100s-low 300s on the steroids -denies any hypoglycemia -also has hemorrhoid and wants a prescription cr for this, denies bleeding -denies: -chronic conditions stable ROS: See pertinent positives and negatives per HPI.  Past Medical History  Diagnosis Date  . ALLERGIC RHINITIS   . GERD (gastroesophageal reflux disease)   . Hypertension   . Hypothyroidism   . Hx of colonic polyps   . Wears glasses   . Radiation 09/15/13-11/01/13    Left chest wall/PAB/scar/supraclavicular fossa  . SYNCOPE 05/15/2010    Qualifier: Diagnosis of  By: Arnoldo Morale MD, [place], WITH RADICULOPATHY 10/01/2007    Qualifier: Diagnosis of  By: Arnoldo Morale MD, [place] 05/31/2007    Qualifier: Diagnosis of  By: Arnoldo Morale MD, [place], HX OF  05/31/2007    Qualifier: Diagnosis of  By: Arnoldo Morale MD, Balinda Quails   . Lymph edema L arm from breast ca tx 12/22/2013  . H/O blood clots     hx of small blood clots in both legs  . Diabetes mellitus without complication ([place])     type 2 - on Metformin  . Anxiety   . Neuropathy due to chemotherapeutic drug ([place])   . Breast cancer ([place]) 1988, age 42    right  . Breast cancer ([place]) 09/30/12    left, ER/PR -, Her 2 -  . Secondary lung cancer ([place]) 2016  . Anemia     during chemo  . History of IBS   . Glaucoma   . Dry skin   . Lymphedema of arm     left arm, post mastectomy  . Shortness of breath dyspnea     with exertion (since onset of pleural effusion) Wears O2 2 L as needed  . Chronic respiratory failure; On Home oxygen 2L since 12/03/2014 02/11/2015    Past Surgical History  Procedure Laterality Date  . Tonsillectomy    . Caesarean section      x 1  . Mastectomy  04/1987    right side  . Abdominal hysterectomy  06/1988    fibroids  . Portacath placement Right 10/28/2012    Procedure: PORT PLACEMENT;  Surgeon: Joyice Faster. Cornett, MD;  Location: [facility];  Service: General;  Laterality: Right;  Right Subclavian Vein  . Mastectomy modified radical Left 07/19/2013    Procedure:  MASTECTOMY MODIFIED RADICAL;  Surgeon: Joyice Faster. Cornett, MD;  Location: [place];  Service: General;  Laterality: Left;  . Port-a-cath removal Right 07/19/2013    Procedure: REMOVAL PORT-A-CATH;  Surgeon: Joyice Faster. Cornett, MD;  Location: [facility];  Service: General;  Laterality: Right;  . Cesarean section    . Breast surgery Left 07/19/13    MRM  . Breast surgery Right     mastectomy  . Port-a-cath insertion  12/03/2014  . Colonoscopy    . Chest tube insertion Right 01/02/2015    Procedure: INSERTION PLEURAL DRAINAGE CATHETER RIGHT CHEST;  Surgeon: Gaye Pollack, MD;  Location: [facility];  Service: Thoracic;  Laterality: Right;  . Removal of pleural drainage  catheter Right 01/16/2015    Procedure: REMOVAL OF PLEURAL DRAINAGE CATHETER;  Surgeon: Gaye Pollack, MD;  Location: [place];  Service: Thoracic;  Laterality: Right;  . Talc pleurodesis Right 01/16/2015    Procedure: Pietro Cassis;  Surgeon: Gaye Pollack, MD;  Location: [facility];  Service: Thoracic;  Laterality: Right;    Family History  Problem Relation Age of Onset  . Colon cancer Mother 69    family hx of colon ca 1st degree relative <60  . Hypertension Mother   . Coronary artery disease Father     family hx of CAD female 1st degree relative <50  . Stomach cancer Neg Hx   . Rectal cancer Neg Hx   . Esophageal cancer Neg Hx   . Breast cancer Maternal Grandmother 80  . Diabetes Paternal Grandmother   . Breast cancer Cousin 10    maternal cousin  . Coronary artery disease Maternal Grandfather   . Breast cancer Sister     paternal half sister; died in her 53s    Social History   Social History  . Marital Status: Married    Spouse Name: Donnie  . Number of Children: 1  . Years of Education: N/A   Occupational History  . Unemployed    Social History Main Topics  . Smoking status: Never Smoker   . Smokeless tobacco: Never Used  . Alcohol Use: No  . Drug Use: No  . Sexual Activity: Not Currently     Comment: menarche 75, 1st pregnancy age 10-miscarr, age 49 1st live birth, menop 31   Other Topics Concern  . None   Social History Narrative   Married. Lives w/ husband.  Has not worked since being diagnosed with cancer 2 years ago.  Previously worked as a Solicitor.  Independent with ADLs.      Spiritual Beliefs:Christian      Lifestyle: started the [employer] exercise program for cancer survivors (12/2013); working on diet              Current outpatient prescriptions:  .  cetirizine (ZYRTEC) 10 MG tablet, Take 10 mg by mouth daily as needed for allergies. , Disp: , Rfl:  .  cholecalciferol (VITAMIN D) 1000 UNITS tablet, Take 1,000 Units by mouth every  morning. , Disp: , Rfl:  .  dexamethasone (DECADRON) 2 MG tablet, Take 1 tablet (2 mg total) by mouth 2 (two) times daily., Disp: 60 tablet, Rfl: 0 .  dexamethasone (DECADRON) 4 MG tablet, Continue taking 4 mg tablet three time daily for one week until October 13th, 2016 and starting October 14th, 2016 start taking 4 mg tablet twice daily for one week. Dr. Lindi Adie will see patient within next two weeks and will continue  to taper down., Disp: 40 tablet, Rfl: 0 .  diltiazem (CARDIZEM CD) 120 MG 24 hr capsule, TAKE 1 CAPSULE (120 MG TOTAL) BY MOUTH EVERY MORNING., Disp: 90 capsule, Rfl: 1 .  esomeprazole (NEXIUM) 20 MG capsule, Take 20 mg by mouth daily as needed (acid). , Disp: , Rfl:  .  fluticasone (FLONASE) 50 MCG/ACT nasal spray, Place 2 sprays into both nostrils daily as needed for allergies or rhinitis., Disp: , Rfl:  .  HYDROmorphone (DILAUDID) 2 MG tablet, Take 0.5 tablets (1 mg total) by mouth every 6 (six) hours as needed for severe pain., Disp: 30 tablet, Rfl: 0 .  insulin aspart (NOVOLOG) 100 UNIT/ML injection, Inject 5 Units into the skin 3 (three) times daily with meals., Disp: 10 mL, Rfl: 5 .  insulin glargine (LANTUS) 100 UNIT/ML injection, Inject 0.1 mLs (10 Units total) into the skin at bedtime., Disp: 10 mL, Rfl: 11 .  KLOR-CON M20 20 MEQ tablet, TAKE 1 TABLET (20 MEQ TOTAL) BY MOUTH 2 (TWO) TIMES DAILY., Disp: 60 tablet, Rfl: 0 .  latanoprost (XALATAN) 0.005 % ophthalmic solution, Place 1 drop into both eyes at bedtime. , Disp: , Rfl:  .  levothyroxine (SYNTHROID, LEVOTHROID) 50 MCG tablet, Take 1 tablet (50 mcg total) by mouth daily., Disp: 90 tablet, Rfl: 3 .  lidocaine-prilocaine (EMLA) cream, Apply to affected area once (Patient taking differently: Apply 1 application topically as needed (for chemo). Apply to affected area once), Disp: 30 g, Rfl: 3 .  LORazepam (ATIVAN) 0.5 MG tablet, Take 1 tablet (0.5 mg total) by mouth every 6 (six) hours as needed (Nausea or vomiting)., Disp: 30  tablet, Rfl: 0 .  metFORMIN (GLUCOPHAGE) 500 MG tablet, Take 1000mg  (2 tabs) in the morning and 500mg  (1 tab) in the evening with meals, Disp: 270 tablet, Rfl: 3 .  nystatin (MYCOSTATIN) 100000 UNIT/ML suspension, TAKE 5 MLS (500,000 UNITS TOTAL) BY MOUTH 2 (TWO) TIMES DAILY., Disp: 60 mL, Rfl: 0 .  ondansetron (ZOFRAN) 8 MG tablet, Take 1 tablet (8 mg total) by mouth every 8 (eight) hours as needed for nausea or vomiting., Disp: 30 tablet, Rfl: 1 .  oxyCODONE (OXY IR/ROXICODONE) 5 MG immediate release tablet, TAKE ONE TABLET BY MOUTH EVERY FOUR HOURS AS NEEDED FOR MODERATE PAIN, Disp: , Rfl: 0 .  prochlorperazine (COMPAZINE) 10 MG tablet, Take 1 tablet (10 mg total) by mouth every 6 (six) hours as needed (Nausea or vomiting)., Disp: 30 tablet, Rfl: 1 .  Tetrahydrozoline HCl (VISINE OP), Apply 1-2 drops to eye daily as needed (itchy eyes.)., Disp: , Rfl:  .  timolol (TIMOPTIC) 0.5 % ophthalmic solution, Place 1 drop into both eyes 2 (two) times daily., Disp: , Rfl:  .  hydrocortisone (ANUSOL-HC) 2.5 % rectal cream, Place 1 application rectally 2 (two) times daily., Disp: 30 g, Rfl: 0  EXAM:  Filed Vitals:   05/31/15 1328  BP: 116/80  Pulse: 113  Temp: 98.1 F (36.7 C)    There is no weight on file to calculate BMI.  GENERAL: vitals reviewed and listed above, alert, oriented, appears well hydrated and in no acute distress  HEENT: atraumatic, conjunttiva clear, no obvious abnormalities on inspection of external nose and ears  NECK: no obvious masses on inspection  LUNGS: clear to auscultation bilaterally, no wheezes, rales or rhonchi, good air movement  CV: HRRR, no peripheral edema  MS: moves all extremities without noticeable abnormality  PSYCH: pleasant and cooperative, no obvious depression or anxiety  ASSESSMENT AND PLAN:  Discussed the following assessment and plan:  Breast cancer of upper-outer quadrant of left female breast ([place])  Type 2 diabetes mellitus with  complication, with long-term current use of insulin (H[facility])  Hemorrhoids, unspecified hemorrhoid type  -Patient advised to return or notify a doctor immediately if symptoms worsen or persist or new concerns arise.  There are no Patient Instructions on file for this visit.   Colin Benton R.

## 2015-05-31 NOTE — Progress Notes (Signed)
Pre visit review using our clinic review tool, if applicable. No additional management support is needed unless otherwise documented below in the visit note. 

## 2015-06-01 ENCOUNTER — Ambulatory Visit: Payer: Medicare Other | Admitting: Family Medicine

## 2015-06-04 ENCOUNTER — Telehealth: Payer: Self-pay | Admitting: Family Medicine

## 2015-06-04 NOTE — Telephone Encounter (Signed)
Pt would like a cal back. Pt states she cannot take the insulin aspart (NOVOLOG) 100 UNIT/ML injection Anymore. Even though dr Maudie Mercury did not put her on, pt states dr Maudie Mercury has helped her before in this situation.

## 2015-06-04 NOTE — Telephone Encounter (Signed)
I called the pt and she stated she feels this insulin has made her "crippled" since being on this for one month. States she has fallen twice outside since being on this as she cannot walk and has felt like this since being on Novolog. States she had to buy a shower chair and a walker due to balance and she cannot do this anymore.

## 2015-06-04 NOTE — Telephone Encounter (Signed)
Christine [name],   Can you call pt and see what her reason for stopping mealtime insulin is? Thanks.

## 2015-06-04 NOTE — Telephone Encounter (Signed)
She does not like taking the novolog and thinks blood sugar is fine with the lantus. Opted to stop novolog. Monitor fasting bs - she is going to call in a few days. On 10 units of lantus at night.

## 2015-06-07 ENCOUNTER — Other Ambulatory Visit: Payer: Self-pay | Admitting: Hematology and Oncology

## 2015-06-07 ENCOUNTER — Telehealth: Payer: Self-pay | Admitting: Family Medicine

## 2015-06-07 NOTE — Telephone Encounter (Signed)
Pt states the 10 mg did only a little better, but pt would like to increase to the 12mg . pls call cell phone

## 2015-06-07 NOTE — Telephone Encounter (Signed)
Please call pt. See what fasting BS has been the last 3 days. If > 140 on average then increase lantus to 12 mg. Thanks.

## 2015-06-07 NOTE — Telephone Encounter (Signed)
I called the pt and she stated her sugar readings were: Monday 143--Tues 321--Wed (not taken) and this am it was 321.  I advised her per Dr Maudie Mercury to increase the insulin to 12 and she agreed.

## 2015-06-08 NOTE — Telephone Encounter (Signed)
Chart reviewed.

## 2015-06-12 ENCOUNTER — Telehealth: Payer: Self-pay | Admitting: Family Medicine

## 2015-06-12 NOTE — Telephone Encounter (Signed)
Talked w/ pt. Since going off steroid she plans to stop insulin and call if sugars remain elevated.

## 2015-06-12 NOTE — Telephone Encounter (Signed)
Pt is calling to report her  average blood sugar is 265 over 5 days. Pt dm med was increased. Pt will be going off steroid on 06-15-15

## 2015-06-13 ENCOUNTER — Other Ambulatory Visit: Payer: BLUE CROSS/BLUE SHIELD

## 2015-06-13 ENCOUNTER — Telehealth: Payer: Self-pay | Admitting: *Deleted

## 2015-06-13 ENCOUNTER — Encounter: Payer: Self-pay | Admitting: Radiation Oncology

## 2015-06-13 ENCOUNTER — Ambulatory Visit: Payer: BLUE CROSS/BLUE SHIELD | Admitting: Hematology and Oncology

## 2015-06-13 ENCOUNTER — Ambulatory Visit: Payer: BLUE CROSS/BLUE SHIELD

## 2015-06-13 NOTE — Telephone Encounter (Signed)
Patient called.  She has contrast to drink and doesn't know if it is for the MRI tomorrow or for a CT scan which isnt scheduled yet.  Checked with radiology.  Her MRI is with IV contrast.  The oral contrast that she is has is for the CT scan she is due to have in December, however it is not scheduled yet.  They may have given her the contrast so that she does not have to come back to pick it up.  Patient can call [phone] and schedule CT now if she would like, otherwise they will call her later.   Called patient back and explained all of this to her and she is very appreciative of my checking on this.

## 2015-06-14 ENCOUNTER — Other Ambulatory Visit: Payer: Self-pay | Admitting: Radiation Oncology

## 2015-06-14 ENCOUNTER — Ambulatory Visit (HOSPITAL_COMMUNITY)
Admission: RE | Admit: 2015-06-14 | Discharge: 2015-06-14 | Disposition: A | Discharge status: Home / Self Care | Payer: BLUE CROSS/BLUE SHIELD | Source: Ambulatory Visit | Attending: Radiation Oncology | Admitting: Radiation Oncology

## 2015-06-14 DIAGNOSIS — C801 Malignant (primary) neoplasm, unspecified: Secondary | ICD-10-CM | POA: Insufficient documentation

## 2015-06-14 DIAGNOSIS — C7931 Secondary malignant neoplasm of brain: Secondary | ICD-10-CM

## 2015-06-14 DIAGNOSIS — Z08 Encounter for follow-up examination after completed treatment for malignant neoplasm: Secondary | ICD-10-CM | POA: Diagnosis not present

## 2015-06-14 MED ORDER — GADOBENATE DIMEGLUMINE 529 MG/ML IV SOLN
15.0000 mL | Freq: Once | INTRAVENOUS | Status: AC | PRN
  Administered 2015-06-14: 15 mL via INTRAVENOUS

## 2015-06-15 ENCOUNTER — Ambulatory Visit
Admission: RE | Admit: 2015-06-15 | Discharge: 2015-06-15 | Disposition: A | Discharge status: Home / Self Care | Payer: BLUE CROSS/BLUE SHIELD | Source: Ambulatory Visit | Attending: Radiation Oncology | Admitting: Radiation Oncology

## 2015-06-15 DIAGNOSIS — C7931 Secondary malignant neoplasm of brain: Secondary | ICD-10-CM

## 2015-06-15 HISTORY — DX: Personal history of irradiation: Z92.3

## 2015-06-15 NOTE — Progress Notes (Signed)
   Department of Radiation Oncology  Phone:  [phone] Fax:        [phone]   Name: Christine [name] MRN: [id number]  DOB: 03-Jan-1956  Date: 06/15/2015  Follow Up Visit Note  Diagnosis: Breast cancer of upper-outer quadrant of left female breast [facility])   Staging form: Breast, AJCC 7th Edition     Clinical: Stage IIIB (T4, N1, cM0) - Signed by Thea Silversmith, MD on 10/29/2012       Prognostic indicators: ER-, PR- HER2 -      Pathologic: No stage assigned - Unsigned       Prognostic indicators: ER-, PR- HER2 -  Summary and Interval since last radiation: Whole Brain 35 Gy at 2.5 Gy per fraction X 14 fractions  Interval History: Christine [name] presents today for routine followup.  She taked sher last dose of dexamethasone today. She is stopping her insulin. She has fallen a few times. She has scans scheduled with Dr. Lindi Adie in December. She had an MRI yesterday that showed an excellent clinical response.  She has no headaches or seizures. Her left leg is still weak. She declines physical therapy for now    Physical Exam:  Moon facies. Small amount of thrush on her soft palate  IMPRESSION: Christine [name] is a 59 y.o. female s/p whole brain radiation with excellent clinical response  PLAN:  Restart nystatin for thrush. Instructed husband on what to look for. MRI in 3 months. Instructed on strengthening exercises and encouraged PT. She will call if she would like a referral back to PT.     Thea Silversmith, MD

## 2015-06-18 ENCOUNTER — Emergency Department (HOSPITAL_COMMUNITY): Payer: BLUE CROSS/BLUE SHIELD

## 2015-06-18 ENCOUNTER — Inpatient Hospital Stay (HOSPITAL_COMMUNITY)
Admission: EM | Admit: 2015-06-18 | Discharge: 2015-06-23 | DRG: 871 | Disposition: A | Discharge status: Hospice | Payer: BLUE CROSS/BLUE SHIELD | Attending: Internal Medicine | Admitting: Internal Medicine

## 2015-06-18 ENCOUNTER — Encounter (HOSPITAL_COMMUNITY): Payer: Self-pay | Admitting: Emergency Medicine

## 2015-06-18 ENCOUNTER — Telehealth: Payer: Self-pay | Admitting: *Deleted

## 2015-06-18 DIAGNOSIS — J910 Malignant pleural effusion: Secondary | ICD-10-CM | POA: Diagnosis present

## 2015-06-18 DIAGNOSIS — C50919 Malignant neoplasm of unspecified site of unspecified female breast: Secondary | ICD-10-CM | POA: Insufficient documentation

## 2015-06-18 DIAGNOSIS — Z79891 Long term (current) use of opiate analgesic: Secondary | ICD-10-CM

## 2015-06-18 DIAGNOSIS — D638 Anemia in other chronic diseases classified elsewhere: Secondary | ICD-10-CM | POA: Diagnosis not present

## 2015-06-18 DIAGNOSIS — E1165 Type 2 diabetes mellitus with hyperglycemia: Secondary | ICD-10-CM | POA: Diagnosis present

## 2015-06-18 DIAGNOSIS — H409 Unspecified glaucoma: Secondary | ICD-10-CM | POA: Diagnosis present

## 2015-06-18 DIAGNOSIS — I10 Essential (primary) hypertension: Secondary | ICD-10-CM | POA: Diagnosis present

## 2015-06-18 DIAGNOSIS — Z794 Long term (current) use of insulin: Secondary | ICD-10-CM

## 2015-06-18 DIAGNOSIS — I959 Hypotension, unspecified: Secondary | ICD-10-CM | POA: Diagnosis present

## 2015-06-18 DIAGNOSIS — C7931 Secondary malignant neoplasm of brain: Secondary | ICD-10-CM | POA: Diagnosis present

## 2015-06-18 DIAGNOSIS — Z66 Do not resuscitate: Secondary | ICD-10-CM

## 2015-06-18 DIAGNOSIS — K649 Unspecified hemorrhoids: Secondary | ICD-10-CM | POA: Diagnosis present

## 2015-06-18 DIAGNOSIS — Y95 Nosocomial condition: Secondary | ICD-10-CM | POA: Diagnosis present

## 2015-06-18 DIAGNOSIS — J9621 Acute and chronic respiratory failure with hypoxia: Secondary | ICD-10-CM | POA: Diagnosis present

## 2015-06-18 DIAGNOSIS — A419 Sepsis, unspecified organism: Principal | ICD-10-CM | POA: Diagnosis present

## 2015-06-18 DIAGNOSIS — Z9981 Dependence on supplemental oxygen: Secondary | ICD-10-CM

## 2015-06-18 DIAGNOSIS — R0602 Shortness of breath: Secondary | ICD-10-CM | POA: Diagnosis not present

## 2015-06-18 DIAGNOSIS — F419 Anxiety disorder, unspecified: Secondary | ICD-10-CM | POA: Diagnosis present

## 2015-06-18 DIAGNOSIS — D6481 Anemia due to antineoplastic chemotherapy: Secondary | ICD-10-CM | POA: Diagnosis present

## 2015-06-18 DIAGNOSIS — I9589 Other hypotension: Secondary | ICD-10-CM

## 2015-06-18 DIAGNOSIS — K580 Irritable bowel syndrome with diarrhea: Secondary | ICD-10-CM | POA: Diagnosis present

## 2015-06-18 DIAGNOSIS — R6510 Systemic inflammatory response syndrome (SIRS) of non-infectious origin without acute organ dysfunction: Secondary | ICD-10-CM

## 2015-06-18 DIAGNOSIS — E119 Type 2 diabetes mellitus without complications: Secondary | ICD-10-CM | POA: Diagnosis not present

## 2015-06-18 DIAGNOSIS — J9601 Acute respiratory failure with hypoxia: Secondary | ICD-10-CM | POA: Diagnosis present

## 2015-06-18 DIAGNOSIS — C50419 Malignant neoplasm of upper-outer quadrant of unspecified female breast: Secondary | ICD-10-CM

## 2015-06-18 DIAGNOSIS — Z79899 Other long term (current) drug therapy: Secondary | ICD-10-CM

## 2015-06-18 DIAGNOSIS — Z9221 Personal history of antineoplastic chemotherapy: Secondary | ICD-10-CM

## 2015-06-18 DIAGNOSIS — T451X5A Adverse effect of antineoplastic and immunosuppressive drugs, initial encounter: Secondary | ICD-10-CM | POA: Diagnosis present

## 2015-06-18 DIAGNOSIS — R0902 Hypoxemia: Secondary | ICD-10-CM

## 2015-06-18 DIAGNOSIS — Z8249 Family history of ischemic heart disease and other diseases of the circulatory system: Secondary | ICD-10-CM

## 2015-06-18 DIAGNOSIS — J189 Pneumonia, unspecified organism: Secondary | ICD-10-CM | POA: Diagnosis present

## 2015-06-18 DIAGNOSIS — J90 Pleural effusion, not elsewhere classified: Secondary | ICD-10-CM

## 2015-06-18 DIAGNOSIS — Z86010 Personal history of colonic polyps: Secondary | ICD-10-CM

## 2015-06-18 DIAGNOSIS — Z833 Family history of diabetes mellitus: Secondary | ICD-10-CM

## 2015-06-18 DIAGNOSIS — E869 Volume depletion, unspecified: Secondary | ICD-10-CM | POA: Diagnosis present

## 2015-06-18 DIAGNOSIS — D696 Thrombocytopenia, unspecified: Secondary | ICD-10-CM | POA: Diagnosis present

## 2015-06-18 DIAGNOSIS — L8990 Pressure ulcer of unspecified site, unspecified stage: Secondary | ICD-10-CM | POA: Insufficient documentation

## 2015-06-18 DIAGNOSIS — T380X5A Adverse effect of glucocorticoids and synthetic analogues, initial encounter: Secondary | ICD-10-CM | POA: Diagnosis present

## 2015-06-18 DIAGNOSIS — C50412 Malignant neoplasm of upper-outer quadrant of left female breast: Secondary | ICD-10-CM | POA: Diagnosis not present

## 2015-06-18 DIAGNOSIS — L89152 Pressure ulcer of sacral region, stage 2: Secondary | ICD-10-CM | POA: Diagnosis present

## 2015-06-18 DIAGNOSIS — E039 Hypothyroidism, unspecified: Secondary | ICD-10-CM | POA: Diagnosis present

## 2015-06-18 DIAGNOSIS — J9620 Acute and chronic respiratory failure, unspecified whether with hypoxia or hypercapnia: Secondary | ICD-10-CM

## 2015-06-18 DIAGNOSIS — R4701 Aphasia: Secondary | ICD-10-CM | POA: Diagnosis present

## 2015-06-18 DIAGNOSIS — Z803 Family history of malignant neoplasm of breast: Secondary | ICD-10-CM

## 2015-06-18 DIAGNOSIS — Z800 Family history of malignant neoplasm of digestive organs: Secondary | ICD-10-CM

## 2015-06-18 DIAGNOSIS — K922 Gastrointestinal hemorrhage, unspecified: Secondary | ICD-10-CM | POA: Diagnosis present

## 2015-06-18 DIAGNOSIS — C7800 Secondary malignant neoplasm of unspecified lung: Secondary | ICD-10-CM | POA: Diagnosis present

## 2015-06-18 DIAGNOSIS — G629 Polyneuropathy, unspecified: Secondary | ICD-10-CM | POA: Diagnosis present

## 2015-06-18 DIAGNOSIS — E860 Dehydration: Secondary | ICD-10-CM | POA: Diagnosis present

## 2015-06-18 DIAGNOSIS — R627 Adult failure to thrive: Secondary | ICD-10-CM | POA: Diagnosis present

## 2015-06-18 DIAGNOSIS — Z882 Allergy status to sulfonamides status: Secondary | ICD-10-CM

## 2015-06-18 DIAGNOSIS — K921 Melena: Secondary | ICD-10-CM | POA: Diagnosis present

## 2015-06-18 DIAGNOSIS — Z515 Encounter for palliative care: Secondary | ICD-10-CM | POA: Insufficient documentation

## 2015-06-18 DIAGNOSIS — K219 Gastro-esophageal reflux disease without esophagitis: Secondary | ICD-10-CM | POA: Diagnosis present

## 2015-06-18 DIAGNOSIS — Z9010 Acquired absence of unspecified breast and nipple: Secondary | ICD-10-CM

## 2015-06-18 DIAGNOSIS — Z923 Personal history of irradiation: Secondary | ICD-10-CM

## 2015-06-18 DIAGNOSIS — K5900 Constipation, unspecified: Secondary | ICD-10-CM | POA: Insufficient documentation

## 2015-06-18 DIAGNOSIS — E876 Hypokalemia: Secondary | ICD-10-CM | POA: Diagnosis present

## 2015-06-18 DIAGNOSIS — Z91018 Allergy to other foods: Secondary | ICD-10-CM

## 2015-06-18 LAB — COMPREHENSIVE METABOLIC PANEL
ALBUMIN: 2.5 g/dL — AB (ref 3.5–5.0)
ALT: 19 U/L (ref 14–54)
ANION GAP: 11 (ref 5–15)
AST: 27 U/L (ref 15–41)
Alkaline Phosphatase: 68 U/L (ref 38–126)
BUN: 20 mg/dL (ref 6–20)
CO2: 22 mmol/L (ref 22–32)
Calcium: 8.3 mg/dL — ABNORMAL LOW (ref 8.9–10.3)
Chloride: 102 mmol/L (ref 101–111)
Creatinine, Ser: 0.71 mg/dL (ref 0.44–1.00)
GFR calc Af Amer: >60 mL/min (ref >60)
GFR calc non Af Amer: >60 mL/min (ref >60)
GLUCOSE: 179 mg/dL — AB (ref 65–99)
POTASSIUM: 3.5 mmol/L (ref 3.5–5.1)
SODIUM: 135 mmol/L (ref 135–145)
Total Bilirubin: 1.1 mg/dL (ref 0.3–1.2)
Total Protein: 5.6 g/dL — ABNORMAL LOW (ref 6.5–8.1)

## 2015-06-18 LAB — I-STAT CHEM 8, ED
BUN: 24 mg/dL — ABNORMAL HIGH (ref 6–20)
Calcium, Ion: 1.06 mmol/L — ABNORMAL LOW (ref 1.12–1.23)
Chloride: 99 mmol/L — ABNORMAL LOW (ref 101–111)
Creatinine, Ser: 0.6 mg/dL (ref 0.44–1.00)
Glucose, Bld: 175 mg/dL — ABNORMAL HIGH (ref 65–99)
HEMATOCRIT: 55 % — AB (ref 36.0–46.0)
HEMOGLOBIN: 18.7 g/dL — AB (ref 12.0–15.0)
Potassium: 3.6 mmol/L (ref 3.5–5.1)
SODIUM: 135 mmol/L (ref 135–145)
TCO2: 23 mmol/L (ref 0–100)

## 2015-06-18 LAB — I-STAT CG4 LACTIC ACID, ED: Lactic Acid, Venous: 1.54 mmol/L (ref 0.5–2.0)

## 2015-06-18 LAB — CBC WITH DIFFERENTIAL/PLATELET
BASOS ABS: 0 10*3/uL (ref 0.0–0.1)
BASOS PCT: 0 %
Eosinophils Absolute: 0 10*3/uL (ref 0.0–0.7)
Eosinophils Relative: 0 %
HEMATOCRIT: 26 % — AB (ref 36.0–46.0)
HEMOGLOBIN: 8.7 g/dL — AB (ref 12.0–15.0)
Lymphocytes Relative: 17 %
Lymphs Abs: 0.7 10*3/uL (ref 0.7–4.0)
MCH: 32.8 pg (ref 26.0–34.0)
MCHC: 33.5 g/dL (ref 30.0–36.0)
MCV: 98.1 fL (ref 78.0–100.0)
MONO ABS: 0.2 10*3/uL (ref 0.1–1.0)
Monocytes Relative: 4 %
NEUTROS ABS: 3.3 10*3/uL (ref 1.7–7.7)
NEUTROS PCT: 79 %
Platelets: 56 10*3/uL — ABNORMAL LOW (ref 150–400)
RBC: 2.65 MIL/uL — ABNORMAL LOW (ref 3.87–5.11)
RDW: 16.2 % — AB (ref 11.5–15.5)
WBC: 4.2 10*3/uL (ref 4.0–10.5)

## 2015-06-18 LAB — POC OCCULT BLOOD, ED: FECAL OCCULT BLD: POSITIVE — AB

## 2015-06-18 LAB — I-STAT TROPONIN, ED: TROPONIN I, POC: 0.06 ng/mL (ref 0.00–0.08)

## 2015-06-18 LAB — LIPASE, BLOOD: Lipase: 16 U/L (ref 11–51)

## 2015-06-18 MED ORDER — SODIUM CHLORIDE 0.9 % IV BOLUS (SEPSIS)
1000.0000 mL | Freq: Once | INTRAVENOUS | Status: AC
  Administered 2015-06-18: 1000 mL via INTRAVENOUS

## 2015-06-18 MED ORDER — ONDANSETRON HCL 4 MG/2ML IJ SOLN
4.0000 mg | Freq: Once | INTRAMUSCULAR | Status: AC
  Administered 2015-06-18: 4 mg via INTRAVENOUS
  Filled 2015-06-18: qty 2

## 2015-06-18 MED ORDER — IOHEXOL 350 MG/ML SOLN
100.0000 mL | Freq: Once | INTRAVENOUS | Status: AC | PRN
  Administered 2015-06-18: 100 mL via INTRAVENOUS

## 2015-06-18 NOTE — H&P (Addendum)
PCP: Terressa Koyanagi., DO  [facility]  Referring provider  Silverio Lay   Chief Complaint:  Hypoxia   HPI: Christine [name] is a 59 y.o. female   has a past medical history of ALLERGIC RHINITIS; GERD (gastroesophageal reflux disease); Hypertension; Hypothyroidism; colonic polyps; Wears glasses; Radiation (09/15/13-11/01/13); SYNCOPE (05/15/2010); CERVICAL STRAIN, WITH RADICULOPATHY (10/01/2007); Rosacea (05/31/2007); COLONIC POLYPS, HX OF (05/31/2007); Lymph edema L arm from breast ca tx (12/22/2013); H/O blood clots; Diabetes mellitus without complication (HCC); Anxiety; Neuropathy due to chemotherapeutic drug (HCC); Breast cancer (HCC) (1988, age 7); Breast cancer (HCC) (09/30/12); Secondary lung cancer (HCC) (2016); Anemia; History of IBS; Glaucoma; Dry skin; Lymphedema of arm; Shortness of breath dyspnea; Chronic respiratory failure; On Home oxygen 2L since 12/03/2014 (02/11/2015); and S/P radiation therapy (10/26/13-04/25/2015).   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 She status post chemotherapy and radiation therapy inducing thrombocytopenia and anemia. Patient has known history of recurrent pleural effusions and chronic hypoxia. Chemotherapy currently has been held secondary to patient's poor performance status. Her brain metastasis was treated with dexamethasone which has been discontinued she had an MRI of the brain done on 11 foot did show excellent response. She was left with chronic left leg weakness but did not elect to have any physical therapy. Patient has known history of hemorrhoids but usually there hasn't been any bleeding. Today patient developed worsening diarrhea and some blood in stool with worsening hemorrhoids. Her oxygen was checked at home and it was again showed to be low at which point her family put her on oxygen and brought her back to emergency department. Patient reports poor by mouth  intake. Delford Field to this patient apparently was off of oxygen for a few months. In emergency department she was found to be  tachycardic and hypotensive with blood pressures as low 74/53 patient was given 2 L. CT scan of the chest was done showing no evidence of PE but there is a reaccumulation of moderate right pleural effusion as well as some pulmonary edema and persistent multiple pulmonary nodules. Patient was found to be anemic Hemoglobin down to 8.7 which is down from 12.3 on 10/ 21st But on 10/07 she was down to 8.6 as well. Patient platelet count is down to 56 from 149 on October 21 which is also similar to prior results in the beginning of October.  Patient has history of diabetes was on insulin while on steroid but currently discontinued.  Patient reports for 2 days that the light has been bothering her eyes but denies any headache, no neck pain, no fever. She denies any nausea.   Hospitalist was called for admission for hypotension in the setting of poor by mouth intake  Review of Systems:    Pertinent positives include: shortness of breath at rest. dyspnea on exertion  Constitutional:  No weight loss, night sweats, Fevers, chills, fatigue, weight loss  HEENT:  No headaches, Difficulty swallowing,Tooth/dental problems,Sore throat,  No sneezing, itching, ear ache, nasal congestion, post nasal drip,  Cardio-vascular:  No chest pain, Orthopnea, PND, anasarca, dizziness, palpitations.no Bilateral lower extremity swelling  GI:  No heartburn, indigestion, abdominal pain, nausea, vomiting, diarrhea, change in bowel habits, loss of appetite, melena, blood in stool, hematemesis Resp:   No excess mucus, no productive cough, No non-productive cough, No coughing up of blood.No change in color of mucus.No wheezing. Skin:  no rash or lesions. No jaundice GU:  no dysuria, change in color of urine, no urgency or frequency. No straining to urinate.  No flank pain.  Musculoskeletal:  No joint  pain or no joint swelling. No decreased range of motion. No back pain.  Psych:  No change in mood or affect. No depression or anxiety. No memory loss.  Neuro: no localizing neurological complaints, no tingling, no weakness, no double vision, no gait abnormality, no slurred speech, no confusion  Otherwise ROS are negative except for above, 10 systems were reviewed  Past Medical History: Past Medical History  Diagnosis Date  . ALLERGIC RHINITIS   . GERD (gastroesophageal reflux disease)   . Hypertension   . Hypothyroidism   . Hx of colonic polyps   . Wears glasses   . Radiation 09/15/13-11/01/13    Left chest wall/PAB/scar/supraclavicular fossa  . SYNCOPE 05/15/2010    Qualifier: Diagnosis of  By: Arnoldo Morale MD, [place], WITH RADICULOPATHY 10/01/2007    Qualifier: Diagnosis of  By: Arnoldo Morale MD, [place] 05/31/2007    Qualifier: Diagnosis of  By: Arnoldo Morale MD, [place], HX OF 05/31/2007    Qualifier: Diagnosis of  By: Arnoldo Morale MD, [place]   . Lymph edema L arm from breast ca tx 12/22/2013  . H/O blood clots     hx of small blood clots in both legs  . Diabetes mellitus without complication ([place])     type 2 - on Metformin  . Anxiety   . Neuropathy due to chemotherapeutic drug ([place])   . Breast cancer ([place]) 1988, age 33    right  . Breast cancer ([place]) 09/30/12    left, ER/PR -, Her 2 -  . Secondary lung cancer ([place]) 2016  . Anemia     during chemo  . History of IBS   . Glaucoma   . Dry skin   . Lymphedema of arm     left arm, post mastectomy  . Shortness of breath dyspnea     with exertion (since onset of pleural effusion) Wears O2 2 L as needed  . Chronic respiratory failure; On Home oxygen 2L since 12/03/2014 02/11/2015  . S/P radiation therapy 10/26/13-04/25/2015    Whole Brain 35 Gy at 2.5 Gy per fraction x 14 Fractions   Past Surgical History  Procedure Laterality Date  . Tonsillectomy    . Caesarean section      x 1  . Mastectomy   04/1987    right side  . Abdominal hysterectomy  06/1988    fibroids  . Portacath placement Right 10/28/2012    Procedure: PORT PLACEMENT;  Surgeon: Joyice Faster. Cornett, MD;  Location: [facility];  Service: General;  Laterality: Right;  Right Subclavian Vein  . Mastectomy modified radical Left 07/19/2013    Procedure: MASTECTOMY MODIFIED RADICAL;  Surgeon: Marcello Moores A. Cornett, MD;  Location: [facility];  Service: General;  Laterality: Left;  . Port-a-cath removal Right 07/19/2013    Procedure: REMOVAL PORT-A-CATH;  Surgeon: Joyice Faster. Cornett, MD;  Location: [facility];  Service: General;  Laterality: Right;  . Cesarean section    . Breast surgery Left 07/19/13    MRM  . Breast surgery Right     mastectomy  . Port-a-cath insertion  12/03/2014  . Colonoscopy    . Chest tube insertion Right 01/02/2015    Procedure: INSERTION PLEURAL DRAINAGE CATHETER RIGHT CHEST;  Surgeon: Gaye Pollack, MD;  Location: [facility];  Service: Thoracic;  Laterality: Right;  . Removal of pleural drainage catheter Right 01/16/2015    Procedure: REMOVAL OF PLEURAL DRAINAGE CATHETER;  Surgeon: Gaye Pollack, MD;  Location: [facility];  Service: Thoracic;  Laterality: Right;  . [place] pleurodesis Right 01/16/2015    Procedure: Pietro Cassis;  Surgeon: Gaye Pollack, MD;  Location: [facility] OR;  Service: Thoracic;  Laterality: Right;     Medications: Prior to Admission medications   Medication Sig Start Date End Date Taking? Authorizing Provider  cetirizine (ZYRTEC) 10 MG tablet Take 10 mg by mouth daily as needed for allergies.    Yes Historical Provider, MD  cholecalciferol (VITAMIN D) 1000 UNITS tablet Take 1,000 Units by mouth every morning.    Yes Historical Provider, MD  diltiazem (CARDIZEM CD) 120 MG 24 hr capsule TAKE 1 CAPSULE (120 MG TOTAL) BY MOUTH EVERY MORNING. 05/28/15  Yes Lucretia Kern, DO  fluticasone (FLONASE) 50 MCG/ACT nasal spray Place 2 sprays into both nostrils daily as  needed for allergies or rhinitis.   Yes Historical Provider, MD  hydrocortisone (ANUSOL-HC) 2.5 % rectal cream Place 1 application rectally 2 (two) times daily. 05/31/15  Yes Lucretia Kern, DO  HYDROmorphone (DILAUDID) 2 MG tablet Take 0.5 tablets (1 mg total) by mouth every 6 (six) hours as needed for severe pain. 05/25/15  Yes Nicholas Lose, MD  KLOR-CON M20 20 MEQ tablet TAKE 1 TABLET (20 MEQ TOTAL) BY MOUTH 2 (TWO) TIMES DAILY. 06/08/15  Yes Nicholas Lose, MD  latanoprost (XALATAN) 0.005 % ophthalmic solution Place 1 drop into both eyes at bedtime.    Yes Historical Provider, MD  levothyroxine (SYNTHROID, LEVOTHROID) 50 MCG tablet Take 1 tablet (50 mcg total) by mouth daily. 11/07/14  Yes Lucretia Kern, DO  metFORMIN (GLUCOPHAGE) 500 MG tablet Take $RemoveBef'1000mg'CylQPLtILQ$  (2 tabs) in the morning and $RemoveBef'500mg'gHtXHQDmwr$  (1 tab) in the evening with meals 02/23/15  Yes Lucretia Kern, DO  nystatin (MYCOSTATIN) 100000 UNIT/ML suspension TAKE 5 MLS (500,000 UNITS TOTAL) BY MOUTH 2 (TWO) TIMES DAILY. 05/14/15  Yes Nicholas Lose, MD  timolol (TIMOPTIC) 0.5 % ophthalmic solution Place 1 drop into both eyes 2 (two) times daily.   Yes Historical Provider, MD  Blood Glucose Monitoring Suppl (ONETOUCH VERIO) W/DEVICE KIT by Does not apply route. Per pharmacist at [facility]    Historical Provider, MD  dexamethasone (DECADRON) 2 MG tablet Take 1 tablet (2 mg total) by mouth 2 (two) times daily. Patient not taking: Reported on 06/18/2015 05/28/15   Nicholas Lose, MD  dexamethasone (DECADRON) 4 MG tablet Continue taking 4 mg tablet three time daily for one week until October 13th, 2016 and starting October 14th, 2016 start taking 4 mg tablet twice daily for one week. Dr. Lindi Adie will see patient within next two weeks and will continue to taper down. Patient not taking: Reported on 06/18/2015 05/11/15   Theodis Blaze, MD  glucose blood [facility] VERIO) test strip Use as instructed to check blood sugar three times a day 05/31/15   Lucretia Kern, DO  insulin  aspart (NOVOLOG) 100 UNIT/ML injection Inject 5 Units into the skin 3 (three) times daily with meals. Patient not taking: Reported on 06/18/2015 05/31/15   Lucretia Kern, DO  insulin glargine (LANTUS) 100 UNIT/ML injection Inject 0.1 mLs (10 Units total) into the skin at bedtime. Patient not taking: Reported on 06/18/2015 05/31/15   Lucretia Kern, DO  lidocaine-prilocaine (EMLA) cream Apply to affected area once  Patient taking differently: Apply 1 application topically as needed (for chemo). Apply to affected area once 11/16/14   Nicholas Lose, MD  LORazepam (ATIVAN) 0.5 MG tablet Take 1 tablet (0.5 mg total) by mouth every 6 (six) hours as needed (Nausea or vomiting). Patient not taking: Reported on 06/18/2015 02/28/15   Nicholas Lose, MD  ondansetron (ZOFRAN) 8 MG tablet Take 1 tablet (8 mg total) by mouth every 8 (eight) hours as needed for nausea or vomiting. 04/30/15   Nicholas Lose, MD  prochlorperazine (COMPAZINE) 10 MG tablet Take 1 tablet (10 mg total) by mouth every 6 (six) hours as needed (Nausea or vomiting). 11/16/14   Nicholas Lose, MD  Tetrahydrozoline HCl (VISINE OP) Apply 1-2 drops to eye daily as needed (itchy eyes.).    Historical Provider, MD    Allergies:   Allergies  Allergen Reactions  . Sulfa Antibiotics Hives, Itching and Swelling  . Sulfonamide Derivatives Hives, Itching and Swelling  . Tomato Hives and Itching    Social History:  Uses  walker or wheelchair  Lives at home   With family     reports that she has never smoked. She has never used smokeless tobacco. She reports that she does not drink alcohol or use illicit drugs.    Family History: family history includes Breast cancer in her sister; Breast cancer (age of onset: 33) in her cousin; Breast cancer (age of onset: 15) in her maternal grandmother; Colon cancer (age of onset: 55) in her mother; Coronary artery disease in her father and maternal grandfather; Diabetes in her paternal grandmother; Hypertension in  her mother. There is no history of Stomach cancer, Rectal cancer, or Esophageal cancer.    Physical Exam: Patient Vitals for the past 24 hrs:  BP Temp Temp src Pulse Resp SpO2  06/18/15 2301 (!) 100/50 mmHg - - 105 (!) 28 93 %  06/18/15 2230 (!) 98/53 mmHg - - 107 - 95 %  06/18/15 2215 (!) 87/62 mmHg - - 110 (!) 30 95 %  06/18/15 2201 (!) 82/54 mmHg - - (!) 122 14 95 %  06/18/15 2200 (!) 85/59 mmHg - - 112 (!) 28 94 %  06/18/15 2130 (!) 74/53 mmHg - - 119 - 92 %  06/18/15 2115 (!) 80/55 mmHg - - (!) 121 - 91 %  06/18/15 2103 (!) 78/51 mmHg - - 120 14 94 %  06/18/15 1802 132/61 mmHg 99.2 F (37.3 C) Oral - 24 92 %    1. General:  in No Acute distress 2. Psychological: Alert and   Oriented 3. Head/ENT:     Dry Mucous Membranes                          Head Non traumatic, neck supple                          Normal   Dentition 4. SKIN:  decreased Skin turgor,  Skin clean Dry and intact no rash 5. Heart: Regular rate and rhythm no Murmur, Rub or gallop 6. Lungs:  no wheezes occasional crackles   7. Abdomen: Soft, non-tender, Non distended 8. Lower extremities: no clubbing, cyanosis, or edema 9. Neurologically Grossly intact, moving all 4 extremities equally 10. MSK: Normal range of motion  body mass index is unknown because there is no weight on file.   Labs on Admission:   Results for orders placed or performed during the hospital  encounter of 06/18/15 (from the past 24 hour(s))  Comprehensive metabolic panel     Status: Abnormal   Collection Time: 06/18/15  9:38 PM  Result Value Ref Range   Sodium 135 135 - 145 mmol/L   Potassium 3.5 3.5 - 5.1 mmol/L   Chloride 102 101 - 111 mmol/L   CO2 22 22 - 32 mmol/L   Glucose, Bld 179 (H) 65 - 99 mg/dL   BUN 20 6 - 20 mg/dL   Creatinine, Ser 0.41 0.44 - 1.00 mg/dL   Calcium 8.3 (L) 8.9 - 10.3 mg/dL   Total Protein 5.6 (L) 6.5 - 8.1 g/dL   Albumin 2.5 (L) 3.5 - 5.0 g/dL   AST 27 15 - 41 U/L   ALT 19 14 - 54 U/L   Alkaline  Phosphatase 68 38 - 126 U/L   Total Bilirubin 1.1 0.3 - 1.2 mg/dL   GFR calc non Af Amer >60 >60 mL/min   GFR calc Af Amer >60 >60 mL/min   Anion gap 11 5 - 15  Lipase, blood     Status: None   Collection Time: 06/18/15  9:38 PM  Result Value Ref Range   Lipase 16 11 - 51 U/L  POC occult blood, ED     Status: Abnormal   Collection Time: 06/18/15  9:50 PM  Result Value Ref Range   Fecal Occult Bld POSITIVE (A) NEGATIVE  I-stat troponin, ED     Status: None   Collection Time: 06/18/15 10:01 PM  Result Value Ref Range   Troponin i, poc 0.06 0.00 - 0.08 ng/mL   Comment 3          I-Stat CG4 Lactic Acid, ED     Status: None   Collection Time: 06/18/15 10:09 PM  Result Value Ref Range   Lactic Acid, Venous 1.54 0.5 - 2.0 mmol/L  I-stat chem 8, ed     Status: Abnormal   Collection Time: 06/18/15 10:26 PM  Result Value Ref Range   Sodium 135 135 - 145 mmol/L   Potassium 3.6 3.5 - 5.1 mmol/L   Chloride 99 (L) 101 - 111 mmol/L   BUN 24 (H) 6 - 20 mg/dL   Creatinine, Ser 4.12 0.44 - 1.00 mg/dL   Glucose, Bld 055 (H) 65 - 99 mg/dL   Calcium, Ion 8.79 (L) 1.12 - 1.23 mmol/L   TCO2 23 0 - 100 mmol/L   Hemoglobin 18.7 (H) 12.0 - 15.0 g/dL   HCT 78.4 (H) 91.4 - 76.0 %  CBC with Differential/Platelet     Status: Abnormal (Preliminary result)   Collection Time: 06/18/15 11:20 PM  Result Value Ref Range   WBC 4.2 4.0 - 10.5 K/uL   RBC 2.65 (L) 3.87 - 5.11 MIL/uL   Hemoglobin 8.7 (L) 12.0 - 15.0 g/dL   HCT 75.9 (L) 78.9 - 64.5 %   MCV 98.1 78.0 - 100.0 fL   MCH 32.8 26.0 - 34.0 pg   MCHC 33.5 30.0 - 36.0 g/dL   RDW 27.3 (H) 99.5 - 91.6 %   Platelets PENDING 150 - 400 K/uL   Neutrophils Relative % 79 %   Neutro Abs 3.3 1.7 - 7.7 K/uL   Lymphocytes Relative 17 %   Lymphs Abs 0.7 0.7 - 4.0 K/uL   Monocytes Relative 4 %   Monocytes Absolute 0.2 0.1 - 1.0 K/uL   Eosinophils Relative 0 %   Eosinophils Absolute 0.0 0.0 - 0.7 K/uL   Basophils Relative 0 %  Basophils Absolute 0.0 0.0 -  0.1 K/uL    UA pending  Lab Results  Component Value Date   HGBA1C 6.3* 02/11/2015    CrCl cannot be calculated (Unknown ideal weight.).  BNP (last 3 results) No results for input(s): PROBNP in the last 8760 hours.  Other results:  I have pearsonaly reviewed this: ECG REPORT  Rate:113  Rhythm: Sinus tachycardia  ST&T Change: Diffuse ST depressions likely rate related QTC 470  There were no vitals filed for this visit.   Cultures:    Component Value Date/Time   SDES URINE, RANDOM 04/03/2015 0830   SPECREQUEST NONE 04/03/2015 0830   CULT  04/03/2015 0830    2,000 COLONIES/mL INSIGNIFICANT GROWTH Performed at [place] 04/04/2015 FINAL 04/03/2015 0830     Radiological Exams on Admission: Ct Angio Chest Pe W/cm &/or Wo Cm  06/18/2015  CLINICAL DATA:  Shortness of breath and hypoxia. Rule out pulmonary embolus. EXAM: CT ANGIOGRAPHY CHEST WITH CONTRAST TECHNIQUE: Multidetector CT imaging of the chest was performed using the standard protocol during bolus administration of intravenous contrast. Multiplanar CT image reconstructions and MIPs were obtained to evaluate the vascular anatomy. CONTRAST:  152mL OMNIPAQUE IOHEXOL 350 MG/ML SOLN COMPARISON:  Chest radiograph earlier this day.  PET-CT 04/24/2015. FINDINGS: There are no filling defects within the pulmonary arteries to suggest pulmonary embolus. There is dilatation of the main pulmonary artery measuring 3.7 cm that is unchanged from prior PET. The thoracic aorta is normal in caliber with mild atherosclerosis. No dissection or aneurysm. Heart is borderline in size. Right chest port remains in place, tip at the atrial caval junction. Increased size of right pleural effusion with dependent portion as well as fluid in the right major fissure. Minimal fluid in the right minor fissure. There is no left pleural effusion, and trace dependent pleural thickening is seen. There is no pericardial effusion.  Ground-glass opacities in an upper lobe an perihilar predominant distribution consistent with pulmonary edema. There are multiple pulmonary nodules. Largest nodal conglomerate in the right lower lobe measures 2.2 cm, previously 2.4 cm. Right middle lobe nodule measures 12 mm, unchanged. Additional smaller nodules throughout the right lung are again seen, some partially obscured by the pleural effusion and ground-glass opacities. No definite new pulmonary nodules. No endobronchial lesions. No definite hilar or mediastinal adenopathy. Bilobed shaped fluid collection in the left chest wall soft tissues is unchanged in appearance from prior. There are surgical clips in the both axilla. No axillary adenopathy. Air-filled esophagus containing minimal intraluminal debris, small hiatal hernia. Small sclerotic density within T12 vertebral body is unchanged, not previously hypermetabolic on PET. No new sclerotic or lytic lesions. Review of the MIP images confirms the above findings. IMPRESSION: 1. No pulmonary embolus. There is enlargement of main pulmonary artery that is unchanged from prior exam and may reflect pulmonary arterial hypertension. 2. Re-accumulation of moderate right pleural effusion with fluid dependently and in the right major fissure. Diffuse ground-glass opacities in a pattern consistent with pulmonary edema. Question heart failure. Infectious etiology could have a similar appearance but is felt less likely given distribution. 3. Multiple pulmonary nodules in the right lung, minimally decreased versus stable in size from prior PET. Electronically Signed   By: Jeb Levering M.D.   On: 06/18/2015 23:25   Dg Chest Port 1 View  06/18/2015  CLINICAL DATA:  Initial evaluation for hypoxia, shortness of breath. EXAM: PORTABLE CHEST 1 VIEW COMPARISON:  Prior radiograph from 05/16/2015. FINDINGS: Right-sided  Port-A-Cath in place. Moderate cardiomegaly is stable. Mediastinal silhouette is unchanged. A right  pleural effusion is grossly stable relative to previous study. There is superimposed diffuse increased pulmonary vascular congestion with indistinctness of the interstitial markings, suggesting pulmonary edema. A small left pleural effusion likely present as well. No definite focal infiltrate. No pneumothorax. No acute osseus abnormality. Surgical clips overlie the axilla bilaterally. IMPRESSION: 1. Cardiomegaly with findings suggestive of mild diffuse pulmonary edema. 2. Similar right pleural effusion. 3. Small left pleural effusion. Electronically Signed   By: Jeannine Boga M.D.   On: 06/18/2015 22:34    Chart has been reviewed  Family   at  Bedside  plan of care was discussed with [place] [phone]  Assessment/Plan 59 year old female with history of breast cancer with metastasis to lungs and brain currently off of chemotherapy secondary to poor performance status being admitted for respiratory failure with hypoxia most likely secondary to recurrent pleural effusion and diffuse pulmonary process was also found to have recurrent thrombocytopenia and worsening anemia as well as blood in stool most like secondary to hemorrhoids and significant hypotension in the setting of recent discontinuation of steroids. Pulmonology is aware and will see her in consult  Present on Admission:  . Acute respiratory failure with hypoxia ([place]) admit to step down , pulmonary consult, discussed case with Dr. Titus Mould at this point recommending coverage with broad-spectrum antibiotics for possible infectious process as well as steroids for possible pulmonitis. CT scan has been read as pulmonary edema but per personal review of CT scan pulmonary edema is less likely.  . Recurrent pleural effusion on right this appears to be electrolyte most likely malignant effusion defer to pulmonology status is something that can be tapped  . Lung metastasis ([place]) patient has known history of lung metastases current  CT scan showing possible progression. We'll need to discuss with oncology in the morning overall goals of care  . Essential hypertension urine significant hypotension we'll hold blood pressure medications  . Breast cancer of upper-outer quadrant of left female breast ([place]) we'll need to discuss with oncology in the morning regarding overall prognosis  . Anemia of chronic disease hemoglobin down to 8.6 at this point there is no indication for transfusion but will obtain type and screen. In the past was thought that she has anemia of chronic disease or chemotherapy-induced anemia   blood in stool most likely secondary to hemorrhoids in the setting of thrombocytopenia. Conservative management continue to monitor   CBC examination status at this point active bleeding had resolved  . Thrombocytopenia ([place]) the past and was considered to be secondary to chemotherapy. We'll need to discuss with oncology   hypotension - given recent exposure to steroids will be to [place] for possible adrenal insufficiency as per pulmonology will restart steroids.  Will obtain echogram to make shows no evidence of heart failure  Diarrhea- Will obtain stool cultures and check for C. Difficile SIRS - given low-grade fever, tachycardia, hypotension we will obtain blood cultures and urine culture and initiate broad-spectrum antibiotics for now diabetes mellitus types 2 will hold off on metformin given recent contrast exposure. Continue sliding scale hold on standing insulin for now and see how her blood sugars doing  Prophylaxis: SCD    CODE STATUS:  FULL CODE  as per patient   Disposition:  likely will need placement for rehabilitation  Other plan as per orders.  I have spent a total of 75 min on this admission extra time was spent to discuss case with pulmonology extra time was taken to review all radiology imaging dating back to September 2016  [place] 06/18/2015, 11:49 PM  [facility]  Pager [phone]   after 2 AM please page floor coverage PA If 7AM-7PM, please contact the day team taking care of the patient  [email or web address]  Password [email or web address]

## 2015-06-18 NOTE — ED Notes (Signed)
Pt c/o loss of appetite, diarrhea, blood in stool again and has hemorrhoid.  Pt O2 was low at home when checked by family so placed on O2 at home.

## 2015-06-18 NOTE — Telephone Encounter (Signed)
Called patient to inform of lab and MRI for 09-19-15 and her fu with Dr. Pablo Ledger on 09-20-15, spoke with patient and she is aware of these appts.

## 2015-06-18 NOTE — ED Provider Notes (Signed)
CSN: [id number]     Arrival date & time 06/18/15  1743 History   First MD Initiated Contact with Patient 06/18/15 2132     Chief Complaint  Patient presents with  . Diarrhea  . Anorexia     (Consider location/radiation/quality/duration/timing/severity/associated sxs/prior Treatment) The history is provided by the patient.  Christine [name] is a 59 y.o. female hx of GERD, HTN, DM, breast cancer with mets to lung, who presenting with worsening shortness of breath, diarrhea poor appetite. Patient had last radiation and chemotherapy several months ago. For the last 3 days, patient was noted to have poor appetite. As it was noted to be hypoxic at home so was put on oxygen. Of note patient was on oxygen all the time ago but has been off oxygen for several months. Also has poor appetite but no vomiting. Several episodes diarrhea. She did have some blood in her stool but she states that she has a history of Hemorrhoids and it feels like her hemorrhoids bleeding. She denies any abdominal pain but does have a history of diverticulosis.      Past Medical History  Diagnosis Date  . ALLERGIC RHINITIS   . GERD (gastroesophageal reflux disease)   . Hypertension   . Hypothyroidism   . Hx of colonic polyps   . Wears glasses   . Radiation 09/15/13-11/01/13    Left chest wall/PAB/scar/supraclavicular fossa  . SYNCOPE 05/15/2010    Qualifier: Diagnosis of  By: Arnoldo Morale MD, [place], WITH RADICULOPATHY 10/01/2007    Qualifier: Diagnosis of  By: Arnoldo Morale MD, [place] 05/31/2007    Qualifier: Diagnosis of  By: Arnoldo Morale MD, [place], HX OF 05/31/2007    Qualifier: Diagnosis of  By: Arnoldo Morale MD, [place]   . Lymph edema L arm from breast ca tx 12/22/2013  . H/O blood clots     hx of small blood clots in both legs  . Diabetes mellitus without complication ([place])     type 2 - on Metformin  . Anxiety   . Neuropathy due to chemotherapeutic drug ([place])   . Breast cancer  ([place]) 1988, age 49    right  . Breast cancer ([place]) 09/30/12    left, ER/PR -, Her 2 -  . Secondary lung cancer ([place]) 2016  . Anemia     during chemo  . History of IBS   . Glaucoma   . Dry skin   . Lymphedema of arm     left arm, post mastectomy  . Shortness of breath dyspnea     with exertion (since onset of pleural effusion) Wears O2 2 L as needed  . Chronic respiratory failure; On Home oxygen 2L since 12/03/2014 02/11/2015  . S/P radiation therapy 10/26/13-04/25/2015    Whole Brain 35 Gy at 2.5 Gy per fraction x 14 Fractions   Past Surgical History  Procedure Laterality Date  . Tonsillectomy    . Caesarean section      x 1  . Mastectomy  04/1987    right side  . Abdominal hysterectomy  06/1988    fibroids  . Portacath placement Right 10/28/2012    Procedure: PORT PLACEMENT;  Surgeon: Joyice Faster. Cornett, MD;  Location: [place];  Service: General;  Laterality: Right;  Right Subclavian Vein  . Mastectomy modified radical Left 07/19/2013    Procedure: MASTECTOMY MODIFIED RADICAL;  Surgeon: Marcello Moores A. Cornett, MD;  Location: [facility]  [place] [facility];  Service: General;  Laterality: Left;  . Port-a-cath removal Right 07/19/2013    Procedure: REMOVAL PORT-A-CATH;  Surgeon: Joyice Faster. Cornett, MD;  Location: [facility];  Service: General;  Laterality: Right;  . Cesarean section    . Breast surgery Left 07/19/13    MRM  . Breast surgery Right     mastectomy  . Port-a-cath insertion  12/03/2014  . Colonoscopy    . Chest tube insertion Right 01/02/2015    Procedure: INSERTION PLEURAL DRAINAGE CATHETER RIGHT CHEST;  Surgeon: Gaye Pollack, MD;  Location: [place] [facility];  Service: Thoracic;  Laterality: Right;  . Removal of pleural drainage catheter Right 01/16/2015    Procedure: REMOVAL OF PLEURAL DRAINAGE CATHETER;  Surgeon: Gaye Pollack, MD;  Location: [place] [facility];  Service: Thoracic;  Laterality: Right;  . [place] pleurodesis Right 01/16/2015    Procedure: Pietro Cassis;   Surgeon: Gaye Pollack, MD;  Location: [facility];  Service: Thoracic;  Laterality: Right;   Family History  Problem Relation Age of Onset  . Colon cancer Mother 13    family hx of colon ca 1st degree relative <60  . Hypertension Mother   . Coronary artery disease Father     family hx of CAD female 1st degree relative <50  . Stomach cancer Neg Hx   . Rectal cancer Neg Hx   . Esophageal cancer Neg Hx   . Breast cancer Maternal Grandmother 80  . Diabetes Paternal Grandmother   . Breast cancer Cousin 58    maternal cousin  . Coronary artery disease Maternal Grandfather   . Breast cancer Sister     paternal half sister; died in her 69s   Social History  Substance Use Topics  . Smoking status: Never Smoker   . Smokeless tobacco: Never Used  . Alcohol Use: No   OB History    Obstetric Comments   meanrche age 64, G3 , p 1, menopause [age], no HRT     Review of Systems  Respiratory: Positive for shortness of breath.   Gastrointestinal: Positive for diarrhea.  All other systems reviewed and are negative.     Allergies  Sulfa antibiotics; Sulfonamide derivatives; and Tomato  Home Medications   Prior to Admission medications   Medication Sig Start Date End Date Taking? Authorizing Provider  cetirizine (ZYRTEC) 10 MG tablet Take 10 mg by mouth daily as needed for allergies.    Yes Historical Provider, MD  cholecalciferol (VITAMIN D) 1000 UNITS tablet Take 1,000 Units by mouth every morning.    Yes Historical Provider, MD  diltiazem (CARDIZEM CD) 120 MG 24 hr capsule TAKE 1 CAPSULE (120 MG TOTAL) BY MOUTH EVERY MORNING. 05/28/15  Yes Lucretia Kern, DO  fluticasone (FLONASE) 50 MCG/ACT nasal spray Place 2 sprays into both nostrils daily as needed for allergies or rhinitis.   Yes Historical Provider, MD  hydrocortisone (ANUSOL-HC) 2.5 % rectal cream Place 1 application rectally 2 (two) times daily. 05/31/15  Yes Lucretia Kern, DO  HYDROmorphone (DILAUDID) 2 MG tablet Take 0.5 tablets (1  mg total) by mouth every 6 (six) hours as needed for severe pain. 05/25/15  Yes Nicholas Lose, MD  KLOR-CON M20 20 MEQ tablet TAKE 1 TABLET (20 MEQ TOTAL) BY MOUTH 2 (TWO) TIMES DAILY. 06/08/15  Yes Nicholas Lose, MD  latanoprost (XALATAN) 0.005 % ophthalmic solution Place 1 drop into both eyes at bedtime.    Yes Historical Provider, MD  levothyroxine (SYNTHROID, LEVOTHROID) 50  MCG tablet Take 1 tablet (50 mcg total) by mouth daily. 11/07/14  Yes Lucretia Kern, DO  metFORMIN (GLUCOPHAGE) 500 MG tablet Take $RemoveBef'1000mg'WxLkpltFaz$  (2 tabs) in the morning and $RemoveBef'500mg'UTqcYxminl$  (1 tab) in the evening with meals 02/23/15  Yes Lucretia Kern, DO  nystatin (MYCOSTATIN) 100000 UNIT/ML suspension TAKE 5 MLS (500,000 UNITS TOTAL) BY MOUTH 2 (TWO) TIMES DAILY. 05/14/15  Yes Nicholas Lose, MD  timolol (TIMOPTIC) 0.5 % ophthalmic solution Place 1 drop into both eyes 2 (two) times daily.   Yes Historical Provider, MD  Blood Glucose Monitoring Suppl (ONETOUCH VERIO) W/DEVICE KIT by Does not apply route. Per pharmacist at [facility]    Historical Provider, MD  dexamethasone (DECADRON) 2 MG tablet Take 1 tablet (2 mg total) by mouth 2 (two) times daily. Patient not taking: Reported on 06/18/2015 05/28/15   Nicholas Lose, MD  dexamethasone (DECADRON) 4 MG tablet Continue taking 4 mg tablet three time daily for one week until October 13th, 2016 and starting October 14th, 2016 start taking 4 mg tablet twice daily for one week. Dr. Lindi Adie will see patient within next two weeks and will continue to taper down. Patient not taking: Reported on 06/18/2015 05/11/15   Theodis Blaze, MD  glucose blood [facility] VERIO) test strip Use as instructed to check blood sugar three times a day 05/31/15   Lucretia Kern, DO  insulin aspart (NOVOLOG) 100 UNIT/ML injection Inject 5 Units into the skin 3 (three) times daily with meals. Patient not taking: Reported on 06/18/2015 05/31/15   Lucretia Kern, DO  insulin glargine (LANTUS) 100 UNIT/ML injection Inject 0.1 mLs (10 Units  total) into the skin at bedtime. Patient not taking: Reported on 06/18/2015 05/31/15   Lucretia Kern, DO  lidocaine-prilocaine (EMLA) cream Apply to affected area once Patient taking differently: Apply 1 application topically as needed (for chemo). Apply to affected area once 11/16/14   Nicholas Lose, MD  LORazepam (ATIVAN) 0.5 MG tablet Take 1 tablet (0.5 mg total) by mouth every 6 (six) hours as needed (Nausea or vomiting). Patient not taking: Reported on 06/18/2015 02/28/15   Nicholas Lose, MD  ondansetron (ZOFRAN) 8 MG tablet Take 1 tablet (8 mg total) by mouth every 8 (eight) hours as needed for nausea or vomiting. 04/30/15   Nicholas Lose, MD  prochlorperazine (COMPAZINE) 10 MG tablet Take 1 tablet (10 mg total) by mouth every 6 (six) hours as needed (Nausea or vomiting). 11/16/14   Nicholas Lose, MD  Tetrahydrozoline HCl (VISINE OP) Apply 1-2 drops to eye daily as needed (itchy eyes.).    Historical Provider, MD   BP 100/50 mmHg  Pulse 105  Temp(Src) 99.2 F (37.3 C) (Oral)  Resp 28  SpO2 93% Physical Exam  Constitutional: She is oriented to person, place, and time.  Ill appearing   HENT:  Head: Normocephalic.  MM dry   Eyes: Conjunctivae are normal. Pupils are equal, round, and reactive to light.  Neck: Normal range of motion. Neck supple.  Cardiovascular: Regular rhythm and normal heart sounds.   Tachycardic   Pulmonary/Chest:  Tachypneic, diminished bilateral bases with crackles  Abdominal: Soft. Bowel sounds are normal. She exhibits no distension. There is no tenderness. There is no rebound.  Musculoskeletal: Normal range of motion. She exhibits no edema or tenderness.  Neurological: She is alert and oriented to person, place, and time.  Skin: Skin is warm and dry.  Psychiatric: She has a normal mood and affect. Her behavior is normal. Judgment and  thought content normal.  Nursing note and vitals reviewed.   ED Course  Procedures (including critical care time) CRITICAL  CARE Performed by: Darl Householder, DAVID   Total critical care time: 30 minutes  Critical care time was exclusive of separately billable procedures and treating other patients.  Critical care was necessary to treat or prevent imminent or life-threatening deterioration.  Critical care was time spent personally by me on the following activities: development of treatment plan with patient and/or surrogate as well as nursing, discussions with consultants, evaluation of patient's response to treatment, examination of patient, obtaining history from patient or surrogate, ordering and performing treatments and interventions, ordering and review of laboratory studies, ordering and review of radiographic studies, pulse oximetry and re-evaluation of patient's condition.   Labs Review Labs Reviewed  COMPREHENSIVE METABOLIC PANEL - Abnormal; Notable for the following:    Glucose, Bld 179 (*)    Calcium 8.3 (*)    Total Protein 5.6 (*)    Albumin 2.5 (*)    All other components within normal limits  CBC WITH DIFFERENTIAL/PLATELET - Abnormal; Notable for the following:    RBC 2.65 (*)    Hemoglobin 8.7 (*)    HCT 26.0 (*)    RDW 16.2 (*)    All other components within normal limits  POC OCCULT BLOOD, ED - Abnormal; Notable for the following:    Fecal Occult Bld POSITIVE (*)    All other components within normal limits  I-STAT CHEM 8, ED - Abnormal; Notable for the following:    Chloride 99 (*)    BUN 24 (*)    Glucose, Bld 175 (*)    Calcium, Ion 1.06 (*)    Hemoglobin 18.7 (*)    HCT 55.0 (*)    All other components within normal limits  LIPASE, BLOOD  CBC WITH DIFFERENTIAL/PLATELET  URINALYSIS, ROUTINE W REFLEX MICROSCOPIC (NOT AT [facility])  COMPREHENSIVE METABOLIC PANEL  LIPASE, BLOOD  I-STAT CG4 LACTIC ACID, ED  I-STAT TROPOININ, ED    Imaging Review Ct Angio Chest Pe W/cm &/or Wo Cm  06/18/2015  CLINICAL DATA:  Shortness of breath and hypoxia. Rule out pulmonary embolus. EXAM: CT  ANGIOGRAPHY CHEST WITH CONTRAST TECHNIQUE: Multidetector CT imaging of the chest was performed using the standard protocol during bolus administration of intravenous contrast. Multiplanar CT image reconstructions and MIPs were obtained to evaluate the vascular anatomy. CONTRAST:  139mL OMNIPAQUE IOHEXOL 350 MG/ML SOLN COMPARISON:  Chest radiograph earlier this day.  PET-CT 04/24/2015. FINDINGS: There are no filling defects within the pulmonary arteries to suggest pulmonary embolus. There is dilatation of the main pulmonary artery measuring 3.7 cm that is unchanged from prior PET. The thoracic aorta is normal in caliber with mild atherosclerosis. No dissection or aneurysm. Heart is borderline in size. Right chest port remains in place, tip at the atrial caval junction. Increased size of right pleural effusion with dependent portion as well as fluid in the right major fissure. Minimal fluid in the right minor fissure. There is no left pleural effusion, and trace dependent pleural thickening is seen. There is no pericardial effusion. Ground-glass opacities in an upper lobe an perihilar predominant distribution consistent with pulmonary edema. There are multiple pulmonary nodules. Largest nodal conglomerate in the right lower lobe measures 2.2 cm, previously 2.4 cm. Right middle lobe nodule measures 12 mm, unchanged. Additional smaller nodules throughout the right lung are again seen, some partially obscured by the pleural effusion and ground-glass opacities. No definite new pulmonary nodules. No endobronchial  lesions. No definite hilar or mediastinal adenopathy. Bilobed shaped fluid collection in the left chest wall soft tissues is unchanged in appearance from prior. There are surgical clips in the both axilla. No axillary adenopathy. Air-filled esophagus containing minimal intraluminal debris, small hiatal hernia. Small sclerotic density within T12 vertebral body is unchanged, not previously hypermetabolic on PET. No  new sclerotic or lytic lesions. Review of the MIP images confirms the above findings. IMPRESSION: 1. No pulmonary embolus. There is enlargement of main pulmonary artery that is unchanged from prior exam and may reflect pulmonary arterial hypertension. 2. Re-accumulation of moderate right pleural effusion with fluid dependently and in the right major fissure. Diffuse ground-glass opacities in a pattern consistent with pulmonary edema. Question heart failure. Infectious etiology could have a similar appearance but is felt less likely given distribution. 3. Multiple pulmonary nodules in the right lung, minimally decreased versus stable in size from prior PET. Electronically Signed   By: Jeb Levering M.D.   On: 06/18/2015 23:25   Dg Chest Port 1 View  06/18/2015  CLINICAL DATA:  Initial evaluation for hypoxia, shortness of breath. EXAM: PORTABLE CHEST 1 VIEW COMPARISON:  Prior radiograph from 05/16/2015. FINDINGS: Right-sided Port-A-Cath in place. Moderate cardiomegaly is stable. Mediastinal silhouette is unchanged. A right pleural effusion is grossly stable relative to previous study. There is superimposed diffuse increased pulmonary vascular congestion with indistinctness of the interstitial markings, suggesting pulmonary edema. A small left pleural effusion likely present as well. No definite focal infiltrate. No pneumothorax. No acute osseus abnormality. Surgical clips overlie the axilla bilaterally. IMPRESSION: 1. Cardiomegaly with findings suggestive of mild diffuse pulmonary edema. 2. Similar right pleural effusion. 3. Small left pleural effusion. Electronically Signed   By: Jeannine Boga M.D.   On: 06/18/2015 22:34   I have personally reviewed and evaluated these images and lab results as part of my medical decision-making.   EKG Interpretation   Date/Time:  Monday June 18 2015 22:11:53 EST Ventricular Rate:  113 PR Interval:  104 QRS Duration: 79 QT Interval:  343 QTC Calculation:  470 R Axis:   0 Text Interpretation:  Sinus tachycardia Abnormal R-wave progression, early  transition Repol abnrm suggests ischemia, diffuse leads Baseline wander in  lead(s) V2 tachycardic  Confirmed by Darl Householder  MD, DAVID ([id number]) on 06/18/2015  10:24:06 PM      MDM   Final diagnoses:  None   Karrin Eisenmenger Jasso is a 59 y.o. female here with shortness of breath, failure to thrive. Patient persistently hypotensive, hypoxic, tachycardic. Concerned for PE given hx of cancer. Also consider renal failure, pneumonia, UTI. Will get labs, CT angio chest, UA.   11:44 PM CT angio showed no PE but has increased pleural effusion. Appears dehydrated. Given 2 L bolus and BP around 100/50 now. WBC nl. No signs of pneumonia. Likely dehydrated causing hypotension. Occ positive but has obvious hemorrhoids. Will admit.     Wandra Arthurs, MD 06/18/15 [phone]

## 2015-06-19 ENCOUNTER — Inpatient Hospital Stay (HOSPITAL_COMMUNITY): Payer: BLUE CROSS/BLUE SHIELD

## 2015-06-19 ENCOUNTER — Other Ambulatory Visit: Payer: Self-pay | Admitting: Radiation Oncology

## 2015-06-19 ENCOUNTER — Telehealth: Payer: Self-pay | Admitting: *Deleted

## 2015-06-19 DIAGNOSIS — R4701 Aphasia: Secondary | ICD-10-CM | POA: Diagnosis present

## 2015-06-19 DIAGNOSIS — I959 Hypotension, unspecified: Secondary | ICD-10-CM | POA: Diagnosis present

## 2015-06-19 DIAGNOSIS — J90 Pleural effusion, not elsewhere classified: Secondary | ICD-10-CM

## 2015-06-19 DIAGNOSIS — Z923 Personal history of irradiation: Secondary | ICD-10-CM | POA: Diagnosis not present

## 2015-06-19 DIAGNOSIS — Z515 Encounter for palliative care: Secondary | ICD-10-CM | POA: Diagnosis not present

## 2015-06-19 DIAGNOSIS — K649 Unspecified hemorrhoids: Secondary | ICD-10-CM | POA: Diagnosis present

## 2015-06-19 DIAGNOSIS — Z79899 Other long term (current) drug therapy: Secondary | ICD-10-CM | POA: Diagnosis not present

## 2015-06-19 DIAGNOSIS — Z9221 Personal history of antineoplastic chemotherapy: Secondary | ICD-10-CM | POA: Diagnosis not present

## 2015-06-19 DIAGNOSIS — Z91018 Allergy to other foods: Secondary | ICD-10-CM | POA: Diagnosis not present

## 2015-06-19 DIAGNOSIS — A419 Sepsis, unspecified organism: Secondary | ICD-10-CM | POA: Diagnosis present

## 2015-06-19 DIAGNOSIS — Y95 Nosocomial condition: Secondary | ICD-10-CM | POA: Diagnosis present

## 2015-06-19 DIAGNOSIS — C782 Secondary malignant neoplasm of pleura: Secondary | ICD-10-CM

## 2015-06-19 DIAGNOSIS — R6510 Systemic inflammatory response syndrome (SIRS) of non-infectious origin without acute organ dysfunction: Secondary | ICD-10-CM | POA: Insufficient documentation

## 2015-06-19 DIAGNOSIS — E1165 Type 2 diabetes mellitus with hyperglycemia: Secondary | ICD-10-CM | POA: Diagnosis present

## 2015-06-19 DIAGNOSIS — M79604 Pain in right leg: Secondary | ICD-10-CM

## 2015-06-19 DIAGNOSIS — C799 Secondary malignant neoplasm of unspecified site: Secondary | ICD-10-CM | POA: Diagnosis not present

## 2015-06-19 DIAGNOSIS — T451X5A Adverse effect of antineoplastic and immunosuppressive drugs, initial encounter: Secondary | ICD-10-CM | POA: Diagnosis present

## 2015-06-19 DIAGNOSIS — Z79891 Long term (current) use of opiate analgesic: Secondary | ICD-10-CM | POA: Diagnosis not present

## 2015-06-19 DIAGNOSIS — J189 Pneumonia, unspecified organism: Secondary | ICD-10-CM | POA: Diagnosis present

## 2015-06-19 DIAGNOSIS — R0600 Dyspnea, unspecified: Secondary | ICD-10-CM

## 2015-06-19 DIAGNOSIS — Z86010 Personal history of colonic polyps: Secondary | ICD-10-CM | POA: Diagnosis not present

## 2015-06-19 DIAGNOSIS — Z9010 Acquired absence of unspecified breast and nipple: Secondary | ICD-10-CM | POA: Diagnosis not present

## 2015-06-19 DIAGNOSIS — Z794 Long term (current) use of insulin: Secondary | ICD-10-CM | POA: Diagnosis not present

## 2015-06-19 DIAGNOSIS — C7800 Secondary malignant neoplasm of unspecified lung: Secondary | ICD-10-CM | POA: Diagnosis present

## 2015-06-19 DIAGNOSIS — K219 Gastro-esophageal reflux disease without esophagitis: Secondary | ICD-10-CM | POA: Diagnosis present

## 2015-06-19 DIAGNOSIS — E876 Hypokalemia: Secondary | ICD-10-CM | POA: Diagnosis present

## 2015-06-19 DIAGNOSIS — Z8249 Family history of ischemic heart disease and other diseases of the circulatory system: Secondary | ICD-10-CM | POA: Diagnosis not present

## 2015-06-19 DIAGNOSIS — K580 Irritable bowel syndrome with diarrhea: Secondary | ICD-10-CM | POA: Diagnosis present

## 2015-06-19 DIAGNOSIS — L8990 Pressure ulcer of unspecified site, unspecified stage: Secondary | ICD-10-CM | POA: Insufficient documentation

## 2015-06-19 DIAGNOSIS — Z803 Family history of malignant neoplasm of breast: Secondary | ICD-10-CM | POA: Diagnosis not present

## 2015-06-19 DIAGNOSIS — J9601 Acute respiratory failure with hypoxia: Secondary | ICD-10-CM | POA: Diagnosis present

## 2015-06-19 DIAGNOSIS — I10 Essential (primary) hypertension: Secondary | ICD-10-CM | POA: Diagnosis present

## 2015-06-19 DIAGNOSIS — Z800 Family history of malignant neoplasm of digestive organs: Secondary | ICD-10-CM | POA: Diagnosis not present

## 2015-06-19 DIAGNOSIS — K5900 Constipation, unspecified: Secondary | ICD-10-CM | POA: Diagnosis not present

## 2015-06-19 DIAGNOSIS — K922 Gastrointestinal hemorrhage, unspecified: Secondary | ICD-10-CM | POA: Diagnosis present

## 2015-06-19 DIAGNOSIS — R627 Adult failure to thrive: Secondary | ICD-10-CM | POA: Diagnosis present

## 2015-06-19 DIAGNOSIS — K921 Melena: Secondary | ICD-10-CM | POA: Diagnosis present

## 2015-06-19 DIAGNOSIS — M79605 Pain in left leg: Principal | ICD-10-CM

## 2015-06-19 DIAGNOSIS — D638 Anemia in other chronic diseases classified elsewhere: Secondary | ICD-10-CM | POA: Diagnosis present

## 2015-06-19 DIAGNOSIS — D6481 Anemia due to antineoplastic chemotherapy: Secondary | ICD-10-CM | POA: Diagnosis present

## 2015-06-19 DIAGNOSIS — D696 Thrombocytopenia, unspecified: Secondary | ICD-10-CM | POA: Diagnosis present

## 2015-06-19 DIAGNOSIS — E039 Hypothyroidism, unspecified: Secondary | ICD-10-CM | POA: Diagnosis present

## 2015-06-19 DIAGNOSIS — G629 Polyneuropathy, unspecified: Secondary | ICD-10-CM | POA: Diagnosis present

## 2015-06-19 DIAGNOSIS — J9621 Acute and chronic respiratory failure with hypoxia: Secondary | ICD-10-CM

## 2015-06-19 DIAGNOSIS — Z9981 Dependence on supplemental oxygen: Secondary | ICD-10-CM | POA: Diagnosis not present

## 2015-06-19 DIAGNOSIS — J910 Malignant pleural effusion: Secondary | ICD-10-CM | POA: Diagnosis present

## 2015-06-19 DIAGNOSIS — F419 Anxiety disorder, unspecified: Secondary | ICD-10-CM | POA: Diagnosis present

## 2015-06-19 DIAGNOSIS — Z882 Allergy status to sulfonamides status: Secondary | ICD-10-CM | POA: Diagnosis not present

## 2015-06-19 DIAGNOSIS — T380X5A Adverse effect of glucocorticoids and synthetic analogues, initial encounter: Secondary | ICD-10-CM | POA: Diagnosis present

## 2015-06-19 DIAGNOSIS — Z833 Family history of diabetes mellitus: Secondary | ICD-10-CM | POA: Diagnosis not present

## 2015-06-19 DIAGNOSIS — C50919 Malignant neoplasm of unspecified site of unspecified female breast: Secondary | ICD-10-CM

## 2015-06-19 DIAGNOSIS — C7931 Secondary malignant neoplasm of brain: Secondary | ICD-10-CM

## 2015-06-19 DIAGNOSIS — H409 Unspecified glaucoma: Secondary | ICD-10-CM | POA: Diagnosis present

## 2015-06-19 DIAGNOSIS — E869 Volume depletion, unspecified: Secondary | ICD-10-CM | POA: Diagnosis present

## 2015-06-19 DIAGNOSIS — L89152 Pressure ulcer of sacral region, stage 2: Secondary | ICD-10-CM | POA: Diagnosis present

## 2015-06-19 DIAGNOSIS — E860 Dehydration: Secondary | ICD-10-CM | POA: Diagnosis present

## 2015-06-19 DIAGNOSIS — R0602 Shortness of breath: Secondary | ICD-10-CM | POA: Diagnosis present

## 2015-06-19 DIAGNOSIS — C50412 Malignant neoplasm of upper-outer quadrant of left female breast: Secondary | ICD-10-CM | POA: Diagnosis present

## 2015-06-19 LAB — TYPE AND SCREEN
ABO/RH(D): O NEG
ANTIBODY SCREEN: NEGATIVE

## 2015-06-19 LAB — COMPREHENSIVE METABOLIC PANEL
ALBUMIN: 2 g/dL — AB (ref 3.5–5.0)
ALK PHOS: 63 U/L (ref 38–126)
ALT: 17 U/L (ref 14–54)
ALT: 18 U/L (ref 14–54)
AST: 22 U/L (ref 15–41)
AST: 23 U/L (ref 15–41)
Albumin: 2.1 g/dL — ABNORMAL LOW (ref 3.5–5.0)
Alkaline Phosphatase: 53 U/L (ref 38–126)
Anion gap: 8 (ref 5–15)
Anion gap: 8 (ref 5–15)
BILIRUBIN TOTAL: 1.3 mg/dL — AB (ref 0.3–1.2)
BUN: 17 mg/dL (ref 6–20)
BUN: 14 mg/dL (ref 6–20)
CALCIUM: 7.7 mg/dL — AB (ref 8.9–10.3)
CHLORIDE: 107 mmol/L (ref 101–111)
CHLORIDE: 105 mmol/L (ref 101–111)
CO2: 22 mmol/L (ref 22–32)
CO2: 23 mmol/L (ref 22–32)
CREATININE: 0.44 mg/dL (ref 0.44–1.00)
Calcium: 7.2 mg/dL — ABNORMAL LOW (ref 8.9–10.3)
Creatinine, Ser: 0.48 mg/dL (ref 0.44–1.00)
GFR calc Af Amer: >60 mL/min (ref >60)
GFR calc non Af Amer: >60 mL/min (ref >60)
GLUCOSE: 138 mg/dL — AB (ref 65–99)
Glucose, Bld: 132 mg/dL — ABNORMAL HIGH (ref 65–99)
POTASSIUM: 2.9 mmol/L — AB (ref 3.5–5.1)
Potassium: 3 mmol/L — ABNORMAL LOW (ref 3.5–5.1)
SODIUM: 137 mmol/L (ref 135–145)
Sodium: 136 mmol/L (ref 135–145)
TOTAL PROTEIN: 4.4 g/dL — AB (ref 6.5–8.1)
Total Bilirubin: 1 mg/dL (ref 0.3–1.2)
Total Protein: 5 g/dL — ABNORMAL LOW (ref 6.5–8.1)

## 2015-06-19 LAB — URINALYSIS, ROUTINE W REFLEX MICROSCOPIC
BILIRUBIN URINE: NEGATIVE
Glucose, UA: NEGATIVE mg/dL
Ketones, ur: NEGATIVE mg/dL
NITRITE: NEGATIVE
PH: 5 (ref 5.0–8.0)
Protein, ur: NEGATIVE mg/dL
SPECIFIC GRAVITY, URINE: 1.041 — AB (ref 1.005–1.030)
UROBILINOGEN UA: 0.2 mg/dL (ref 0.0–1.0)

## 2015-06-19 LAB — CBC
HCT: 30.5 % — ABNORMAL LOW (ref 36.0–46.0)
Hemoglobin: 10.2 g/dL — ABNORMAL LOW (ref 12.0–15.0)
MCH: 33.6 pg (ref 26.0–34.0)
MCHC: 33.4 g/dL (ref 30.0–36.0)
MCV: 100.3 fL — ABNORMAL HIGH (ref 78.0–100.0)
PLATELETS: 62 10*3/uL — AB (ref 150–400)
RBC: 3.04 MIL/uL — AB (ref 3.87–5.11)
RDW: 16.6 % — ABNORMAL HIGH (ref 11.5–15.5)
WBC: 3.9 10*3/uL — ABNORMAL LOW (ref 4.0–10.5)

## 2015-06-19 LAB — TROPONIN I
TROPONIN I: 0.04 ng/mL — AB (ref <0.031)
TROPONIN I: 0.05 ng/mL — AB (ref <0.031)
TROPONIN I: 0.06 ng/mL — AB (ref <0.031)

## 2015-06-19 LAB — URINE MICROSCOPIC-ADD ON

## 2015-06-19 LAB — MAGNESIUM: Magnesium: 1 mg/dL — ABNORMAL LOW (ref 1.7–2.4)

## 2015-06-19 LAB — INFLUENZA PANEL BY PCR (TYPE A & B)
H1N1FLUPCR: NOT DETECTED
INFLBPCR: NEGATIVE
Influenza A By PCR: NEGATIVE

## 2015-06-19 LAB — GLUCOSE, CAPILLARY
GLUCOSE-CAPILLARY: 221 mg/dL — AB (ref 65–99)
GLUCOSE-CAPILLARY: 274 mg/dL — AB (ref 65–99)
Glucose-Capillary: 173 mg/dL — ABNORMAL HIGH (ref 65–99)

## 2015-06-19 LAB — HIV ANTIBODY (ROUTINE TESTING W REFLEX): HIV SCREEN 4TH GENERATION: NONREACTIVE

## 2015-06-19 LAB — PHOSPHORUS: Phosphorus: 2.7 mg/dL (ref 2.5–4.6)

## 2015-06-19 LAB — STREP PNEUMONIAE URINARY ANTIGEN: STREP PNEUMO URINARY ANTIGEN: NEGATIVE

## 2015-06-19 LAB — LIPASE, BLOOD: Lipase: 15 U/L (ref 11–51)

## 2015-06-19 LAB — PROCALCITONIN: PROCALCITONIN: 0.59 ng/mL

## 2015-06-19 LAB — TSH: TSH: 0.89 u[IU]/mL (ref 0.350–4.500)

## 2015-06-19 LAB — MRSA PCR SCREENING: MRSA by PCR: NEGATIVE

## 2015-06-19 MED ORDER — INSULIN ASPART 100 UNIT/ML ~~LOC~~ SOLN
0.0000 [IU] | Freq: Three times a day (TID) | SUBCUTANEOUS | Status: DC
  Administered 2015-06-19: 5 [IU] via SUBCUTANEOUS
  Administered 2015-06-19: 2 [IU] via SUBCUTANEOUS
  Administered 2015-06-19: 3 [IU] via SUBCUTANEOUS
  Administered 2015-06-20: 7 [IU] via SUBCUTANEOUS

## 2015-06-19 MED ORDER — TIMOLOL MALEATE 0.5 % OP SOLN
1.0000 [drp] | Freq: Two times a day (BID) | OPHTHALMIC | Status: DC
  Administered 2015-06-19 – 2015-06-23 (×9): 1 [drp] via OPHTHALMIC
  Filled 2015-06-19: qty 5

## 2015-06-19 MED ORDER — LATANOPROST 0.005 % OP SOLN
1.0000 [drp] | Freq: Every day | OPHTHALMIC | Status: DC
  Administered 2015-06-19 – 2015-06-22 (×4): 1 [drp] via OPHTHALMIC
  Filled 2015-06-19: qty 2.5

## 2015-06-19 MED ORDER — FLUDROCORTISONE ACETATE 0.1 MG PO TABS
0.1000 mg | ORAL_TABLET | Freq: Every day | ORAL | Status: DC
  Administered 2015-06-19: 0.1 mg via ORAL
  Filled 2015-06-19 (×3): qty 1

## 2015-06-19 MED ORDER — HYDROCORTISONE 2.5 % RE CREA
1.0000 "application " | TOPICAL_CREAM | Freq: Two times a day (BID) | RECTAL | Status: DC
  Administered 2015-06-19 – 2015-06-23 (×9): 1 via RECTAL
  Filled 2015-06-19: qty 28.35

## 2015-06-19 MED ORDER — POTASSIUM CHLORIDE CRYS ER 20 MEQ PO TBCR
40.0000 meq | EXTENDED_RELEASE_TABLET | Freq: Once | ORAL | Status: AC
  Administered 2015-06-19: 40 meq via ORAL
  Filled 2015-06-19: qty 2

## 2015-06-19 MED ORDER — MAGNESIUM SULFATE 2 GM/50ML IV SOLN
2.0000 g | Freq: Four times a day (QID) | INTRAVENOUS | Status: AC
  Administered 2015-06-19 (×2): 2 g via INTRAVENOUS
  Filled 2015-06-19 (×2): qty 50

## 2015-06-19 MED ORDER — LORATADINE 10 MG PO TABS
10.0000 mg | ORAL_TABLET | Freq: Every day | ORAL | Status: DC
  Administered 2015-06-19 – 2015-06-23 (×5): 10 mg via ORAL
  Filled 2015-06-19 (×5): qty 1

## 2015-06-19 MED ORDER — METHYLPREDNISOLONE SODIUM SUCC 40 MG IJ SOLR
40.0000 mg | Freq: Two times a day (BID) | INTRAMUSCULAR | Status: DC
  Administered 2015-06-19: 40 mg via INTRAVENOUS
  Filled 2015-06-19: qty 1

## 2015-06-19 MED ORDER — VANCOMYCIN HCL IN DEXTROSE 1-5 GM/200ML-% IV SOLN
1000.0000 mg | Freq: Two times a day (BID) | INTRAVENOUS | Status: DC
  Administered 2015-06-19 – 2015-06-20 (×5): 1000 mg via INTRAVENOUS
  Filled 2015-06-19 (×6): qty 200

## 2015-06-19 MED ORDER — ACETAMINOPHEN 650 MG RE SUPP: 650.0000 mg | Freq: Four times a day (QID) | RECTAL | Status: DC | PRN

## 2015-06-19 MED ORDER — INSULIN ASPART 100 UNIT/ML ~~LOC~~ SOLN
0.0000 [IU] | Freq: Every day | SUBCUTANEOUS | Status: DC
  Administered 2015-06-19: 3 [IU] via SUBCUTANEOUS

## 2015-06-19 MED ORDER — SODIUM CHLORIDE 0.9 % IV BOLUS (SEPSIS)
1000.0000 mL | Freq: Once | INTRAVENOUS | Status: AC
  Administered 2015-06-19: 1000 mL via INTRAVENOUS

## 2015-06-19 MED ORDER — ONDANSETRON HCL 4 MG PO TABS: 4.0000 mg | ORAL_TABLET | Freq: Four times a day (QID) | ORAL | Status: DC | PRN

## 2015-06-19 MED ORDER — ONDANSETRON HCL 4 MG/2ML IJ SOLN: 4.0000 mg | Freq: Four times a day (QID) | INTRAMUSCULAR | Status: DC | PRN

## 2015-06-19 MED ORDER — POTASSIUM CHLORIDE CRYS ER 20 MEQ PO TBCR
30.0000 meq | EXTENDED_RELEASE_TABLET | Freq: Four times a day (QID) | ORAL | Status: AC
  Administered 2015-06-19 (×2): 30 meq via ORAL
  Filled 2015-06-19 (×4): qty 1

## 2015-06-19 MED ORDER — HYDROCODONE-ACETAMINOPHEN 5-325 MG PO TABS
1.0000 | ORAL_TABLET | ORAL | Status: DC | PRN
  Administered 2015-06-19: 2 via ORAL
  Administered 2015-06-21: 1 via ORAL
  Administered 2015-06-21: 2 via ORAL
  Administered 2015-06-22 – 2015-06-23 (×2): 1 via ORAL
  Filled 2015-06-19 (×3): qty 1
  Filled 2015-06-19 (×2): qty 2

## 2015-06-19 MED ORDER — NYSTATIN 100000 UNIT/ML MT SUSP
5.0000 mL | Freq: Four times a day (QID) | OROMUCOSAL | Status: DC
  Administered 2015-06-19 – 2015-06-23 (×17): 500000 [IU] via OROMUCOSAL
  Filled 2015-06-19 (×19): qty 5

## 2015-06-19 MED ORDER — SODIUM CHLORIDE 0.9 % IJ SOLN
3.0000 mL | Freq: Two times a day (BID) | INTRAMUSCULAR | Status: DC
Start: 2015-06-19 — End: 2015-06-23
  Administered 2015-06-19 – 2015-06-21 (×5): 3 mL via INTRAVENOUS

## 2015-06-19 MED ORDER — SODIUM CHLORIDE 0.9 % IV SOLN
INTRAVENOUS | Status: AC
  Administered 2015-06-19: 03:00:00 via INTRAVENOUS

## 2015-06-19 MED ORDER — ACETAMINOPHEN 325 MG PO TABS: 650.0000 mg | ORAL_TABLET | Freq: Four times a day (QID) | ORAL | Status: DC | PRN

## 2015-06-19 MED ORDER — HYDROCORTISONE NA SUCCINATE PF 100 MG IJ SOLR
50.0000 mg | Freq: Four times a day (QID) | INTRAMUSCULAR | Status: DC
  Administered 2015-06-19 – 2015-06-21 (×9): 50 mg via INTRAVENOUS
  Filled 2015-06-19 (×2): qty 1
  Filled 2015-06-19: qty 2
  Filled 2015-06-19: qty 1
  Filled 2015-06-19: qty 2
  Filled 2015-06-19: qty 1
  Filled 2015-06-19: qty 2
  Filled 2015-06-19: qty 1
  Filled 2015-06-19 (×3): qty 2

## 2015-06-19 MED ORDER — CETYLPYRIDINIUM CHLORIDE 0.05 % MT LIQD
7.0000 mL | Freq: Two times a day (BID) | OROMUCOSAL | Status: DC
  Administered 2015-06-19 – 2015-06-23 (×8): 7 mL via OROMUCOSAL

## 2015-06-19 MED ORDER — DEXAMETHASONE SODIUM PHOSPHATE 4 MG/ML IJ SOLN
4.0000 mg | Freq: Four times a day (QID) | INTRAMUSCULAR | Status: DC
Start: 2015-06-19 — End: 2015-06-23
  Administered 2015-06-19 – 2015-06-23 (×17): 4 mg via INTRAVENOUS
  Filled 2015-06-19 (×19): qty 1

## 2015-06-19 MED ORDER — DEXTROSE 5 % IV SOLN
2.0000 g | Freq: Three times a day (TID) | INTRAVENOUS | Status: DC
  Administered 2015-06-19 – 2015-06-21 (×7): 2 g via INTRAVENOUS
  Filled 2015-06-19 (×9): qty 2

## 2015-06-19 MED ORDER — LEVOTHYROXINE SODIUM 50 MCG PO TABS
50.0000 ug | ORAL_TABLET | Freq: Every day | ORAL | Status: DC
  Administered 2015-06-19 – 2015-06-23 (×5): 50 ug via ORAL
  Filled 2015-06-19 (×5): qty 1

## 2015-06-19 NOTE — Progress Notes (Signed)
*  PRELIMINARY RESULTS* Echocardiogram 2D Echocardiogram has been performed.  Christine [name] 06/19/2015, 3:13 PM

## 2015-06-19 NOTE — Progress Notes (Signed)
HEMATOLOGY-ONCOLOGY PROGRESS NOTE  SUBJECTIVE: Patient is admitted with respiratory distress and was found to have bilateral lung infiltrates along with right-sided pleural effusion which is partially loculated.  OBJECTIVE: Patient looks extremely fatigued and moderately short of breath. She reports that her legs have gotten extremely weak and that hurt. She continues to have facial puffiness related to prior steroid therapy.  PHYSICAL EXAMINATION: ECOG PERFORMANCE STATUS: 3 - Symptomatic, >50% confined to bed  Filed Vitals:   06/19/15 1400  BP: 73/43  Pulse: 102  Temp:   Resp: 28   Filed Weights   06/19/15 0200  Weight: 167 lb 15.9 oz (76.2 kg)    GENERAL: Fatigued and sleepy OROPHARYNX:no exudate, no erythema and lips, buccal mucosa, and tongue normal  NECK: Facial puffiness LUNGS: Diminished breath sounds at the right lung base as well as crackles bilaterally HEART: regular rate & rhythm and no murmurs and no lower extremity edema ABDOMEN: Moderately distended NEURO: alert & oriented x 3 with fluent speech, lower extremity weakness related to debility  LABORATORY DATA:  I have reviewed the data as listed CMP Latest Ref Rng 06/19/2015 06/18/2015 06/18/2015  Glucose 65 - 99 mg/dL 132(H) 138(H) 175(H)  BUN 6 - 20 mg/dL 14 17 24(H)  Creatinine 0.44 - 1.00 mg/dL 0.44 0.48 0.60  Sodium 135 - 145 mmol/L 136 137 135  Potassium 3.5 - 5.1 mmol/L 3.0(L) 2.9(L) 3.6  Chloride 101 - 111 mmol/L 105 107 99(L)  CO2 22 - 32 mmol/L 23 22 -  Calcium 8.9 - 10.3 mg/dL 7.7(L) 7.2(L) -  Total Protein 6.5 - 8.1 g/dL 5.0(L) 4.4(L) -  Total Bilirubin 0.3 - 1.2 mg/dL 1.0 1.3(H) -  Alkaline Phos 38 - 126 U/L 63 53 -  AST 15 - 41 U/L 23 22 -  ALT 14 - 54 U/L 18 17 -    Lab Results  Component Value Date   WBC 3.9* 06/19/2015   HGB 10.2* 06/19/2015   HCT 30.5* 06/19/2015   MCV 100.3* 06/19/2015   PLT 62* 06/19/2015   NEUTROABS 3.3 06/18/2015    ASSESSMENT AND PLAN: 1. Metastatic breast  cancer: Previous lung biopsy confirmed metastatic breast cancer that was triple negative. She has had malignant pleural effusion that was drained previously and pleurodesis performed. She received palliative chemotherapy with [place]. She had diffuse brain metastases for which she received palliative radiation therapy. After that PET CT scan done on 04/24/2015 showed extensive lung and pleural involvement with lung nodule measuring 2.4 cm. She was extremely frail in my office and we decided to not proceed with any systemic chemotherapy.  2. Thrombocytopenia 3. Bilateral lung infiltrates groundglass changes: I discussed with the family that these are suspicious for metastatic breast cancer. I strongly urged her family to consider hospice care because I do not believe there is any treatment that she can tolerate that could help improve the quality of life for the duration of life. We will consult hospice care. Patient was not interested in reviewing her scans and discussing her prognosis because she felt very weak and tired and she actually fell asleep during this process.  I will try to catch up with her husband tomorrow.  Thank you very much

## 2015-06-19 NOTE — ED Notes (Signed)
Writer was notified that EDP will wants blood drawn by port access

## 2015-06-19 NOTE — Consult Note (Signed)
PULMONARY / CRITICAL CARE MEDICINE   Name: Christine [name] MRN: [id number] DOB: Nov 26, 1955    ADMISSION DATE:  06/18/2015 CONSULTATION DATE:  06/19/2015  REFERRING MD :  Roel Cl[facility]  CHIEF COMPLAINT:  Shortness of breath  INITIAL PRESENTATION: 59 year old female with a past medical history significant for metastatic breast cancer was admitted on 06/19/2015 with shortness of breath. Found to have an enlarging right-sided pleural effusion. Pulmonary and critical care medicine was consulted for the pleural effusion.  STUDIES:  06/18/2015 CT angiogram chest findings small to moderate pleural effusion which is circumferential around the right lung and partially loculated between the upper and middle lobes, multiple pulmonary nodules, interlobular septal thickening noted, mild compressive atelectatic changes noted in the right lower lobe, right upper lobe groundglass opacification as well as left upper lobe opacification of uncertain etiology  SIGNIFICANT EVENTS:    HISTORY OF PRESENT ILLNESS:  This is a pleasant 59 year old female with a past medical history significant for metastatic breast cancer who was admitted to [facility] long hospital on 06/18/2015 with a complaint of shortness of breath. She has an extensive past history for her breast cancer and most recently in 2016 she was noted to have lung metastasis. This is associated with a malignant pleural effusion and in June 2016 she underwent a indwelling pleural catheter placement by Dr. Cyndia Bent. She continued to have relatively high pleural drainage output and a present pleural effusion despite 2 weeks of pleural drainage so she underwent a talc pleurodesis on 01/16/2015 in the right pleural drainage catheter was removed. Since then she had improvement in the right pleural effusion as seen in the September 2016 CT chest. However, her lung disease was still present. In 2016 she has been undergoing radiation therapy. In October  2016 she was admitted to [facility] with confusion and was found to have evidence on MRI brain of possible progression of her metastatic disease. She was treated with Decadron and apparently had a dramatic response from a mental status perspective. She was felt to have possible pseudo-progression of her disease. After discharge she was continued to be treated with whole brain radiation therapy.  It sounds as if her confusion has remained stable but she came to [facility] on 06/18/2015 with a complaint of increasing shortness of breath at home. She said that this developed over the course of 3-4 days and she noted that her oxygen level was low at home. She finally came in on November 14 because the shortness of breath had progressed significantly. She denies cough, mucus production, or chest pain. She states that she has not been experiencing swelling in her abdomen, legs, ankles, or feet.  During this time she was experiencing some diarrhea with blood in the stool.  Pulmonary and critical care medicine was consulted for further evaluation of her pleural effusion.  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  has past  surgical  history that includes Tonsillectomy; caesarean section; Mastectomy (04/1987); Abdominal hysterectomy (06/1988); Portacath placement (Right, 10/28/2012); Mastectomy modified radical (Left, 07/19/2013); Port-a-cath removal (Right, 07/19/2013); Cesarean section; Breast surgery (Left, 07/19/13); Breast surgery (Right); port-a-cath insertion (12/03/2014); Colonoscopy; Chest tube insertion (Right, 01/02/2015); Removal of pleural drainage catheter (Right, 01/16/2015); and Talc pleurodesis (Right, 01/16/2015). Prior to Admission medications   Medication Sig Start Date End Date Taking? Authorizing Provider  cetirizine (ZYRTEC) 10 MG tablet Take 10 mg by mouth daily as needed for allergies.    Yes Historical Provider, MD  cholecalciferol (VITAMIN D) 1000 UNITS tablet Take 1,000 Units by mouth every morning.    Yes Historical Provider, MD  diltiazem (CARDIZEM CD) 120 MG 24 hr capsule TAKE 1 CAPSULE (120 MG TOTAL) BY MOUTH EVERY MORNING. 05/28/15  Yes Lucretia Kern, DO  fluticasone (FLONASE) 50 MCG/ACT nasal spray Place 2 sprays into both nostrils daily as needed for allergies or rhinitis.   Yes Historical Provider, MD  hydrocortisone (ANUSOL-HC) 2.5 % rectal cream Place 1 application rectally 2 (two) times daily. 05/31/15  Yes Lucretia Kern, DO  HYDROmorphone (DILAUDID) 2 MG tablet Take 0.5 tablets (1 mg total) by mouth every 6 (six) hours as needed for severe pain. 05/25/15  Yes Nicholas Lose, MD  KLOR-CON M20 20 MEQ tablet TAKE 1 TABLET (20 MEQ TOTAL) BY MOUTH 2 (TWO) TIMES DAILY. 06/08/15  Yes Nicholas Lose, MD  latanoprost (XALATAN) 0.005 % ophthalmic solution Place 1 drop into both eyes at bedtime.    Yes Historical Provider, MD  levothyroxine (SYNTHROID, LEVOTHROID) 50 MCG tablet Take 1 tablet (50 mcg total) by mouth daily. 11/07/14  Yes Lucretia Kern, DO  metFORMIN (GLUCOPHAGE) 500 MG tablet Take $RemoveBef'1000mg'AGMOJvpZFw$  (2 tabs) in the morning and $RemoveBef'500mg'oBAsjvkGxu$  (1 tab) in the evening with meals 02/23/15  Yes Lucretia Kern, DO  nystatin (MYCOSTATIN)  100000 UNIT/ML suspension TAKE 5 MLS (500,000 UNITS TOTAL) BY MOUTH 2 (TWO) TIMES DAILY. 05/14/15  Yes Nicholas Lose, MD  timolol (TIMOPTIC) 0.5 % ophthalmic solution Place 1 drop into both eyes 2 (two) times daily.   Yes Historical Provider, MD  Blood Glucose Monitoring Suppl (ONETOUCH VERIO) W/DEVICE KIT by Does not apply route. Per pharmacist at [facility]    Historical Provider, MD  dexamethasone (DECADRON) 2 MG tablet Take 1 tablet (2 mg total) by mouth 2 (two) times daily. Patient not taking: Reported on 06/18/2015 05/28/15   Nicholas Lose, MD  dexamethasone (DECADRON) 4 MG tablet Continue taking 4 mg tablet three time daily for one week until October 13th, 2016 and starting October 14th, 2016 start taking 4 mg tablet twice daily for one week. Dr. Lindi Adie will see patient within next two weeks and will continue to taper down. Patient not taking: Reported on 06/18/2015 05/11/15   Theodis Blaze, MD  glucose blood [facility] VERIO) test strip Use as instructed to check blood sugar three times a day 05/31/15   Lucretia Kern, DO  insulin aspart (NOVOLOG) 100 UNIT/ML injection Inject 5 Units into the skin 3 (three) times daily with meals. Patient not taking: Reported on 06/18/2015 05/31/15   Lucretia Kern, DO  insulin glargine (LANTUS) 100 UNIT/ML injection Inject 0.1 mLs (10 Units total) into the skin at bedtime. Patient not taking: Reported on 06/18/2015 05/31/15   Lucretia Kern, DO  lidocaine-prilocaine (EMLA) cream Apply to affected area once Patient taking differently: Apply 1 application topically as needed (for chemo). Apply to affected area once 11/16/14   Nicholas Lose,  MD  LORazepam (ATIVAN) 0.5 MG tablet Take 1 tablet (0.5 mg total) by mouth every 6 (six) hours as needed (Nausea or vomiting). Patient not taking: Reported on 06/18/2015 02/28/15   Nicholas Lose, MD  ondansetron (ZOFRAN) 8 MG tablet Take 1 tablet (8 mg total) by mouth every 8 (eight) hours as needed for nausea or vomiting. 04/30/15    Nicholas Lose, MD  prochlorperazine (COMPAZINE) 10 MG tablet Take 1 tablet (10 mg total) by mouth every 6 (six) hours as needed (Nausea or vomiting). 11/16/14   Nicholas Lose, MD  Tetrahydrozoline HCl (VISINE OP) Apply 1-2 drops to eye daily as needed (itchy eyes.).    Historical Provider, MD   Allergies  Allergen Reactions  . Sulfa Antibiotics Hives, Itching and Swelling  . Sulfonamide Derivatives Hives, Itching and Swelling  . Tomato Hives and Itching    FAMILY HISTORY:  indicated that her mother is deceased. She indicated that her father is alive. She indicated that only one of her two sisters is alive. She indicated that her maternal grandmother is deceased. She indicated that her maternal grandfather is deceased. She indicated that her paternal grandmother is deceased. She indicated that her paternal grandfather is deceased. She indicated that her cousin is deceased.  SOCIAL HISTORY:  reports that she has never smoked. She has never used smokeless tobacco. She reports that she does not drink alcohol or use illicit drugs.  REVIEW OF SYSTEMS:   Gen: Denies fever, chills, weight change, fatigue, night sweats HEENT: Denies blurred vision, double vision, hearing loss, tinnitus, sinus congestion, rhinorrhea, sore throat, neck stiffness, dysphagia PULM: Per history of present illness CV: Denies chest pain, edema, orthopnea, paroxysmal nocturnal dyspnea, palpitations GI: Denies abdominal pain, nausea, vomiting, diarrhea, hematochezia, melena, constipation, change in bowel habits GU: Denies dysuria, hematuria, polyuria, oliguria, urethral discharge Endocrine: Denies hot or cold intolerance, polyuria, polyphagia or appetite change Derm: Denies rash, dry skin, scaling or peeling skin change Heme: Denies easy bruising, bleeding, bleeding gums Neuro: Denies headache, numbness, weakness, slurred speech, loss of memory or consciousness   SUBJECTIVE:   VITAL SIGNS: Temp:  [98.1 F (36.7 C)-99.2 F  (37.3 C)] 98.1 F (36.7 C) (11/15 0430) Pulse Rate:  [99-122] 99 (11/15 0430) Resp:  [14-35] 33 (11/15 0430) BP: (74-132)/(39-62) 90/50 mmHg (11/15 0430) SpO2:  [89 %-95 %] 90 % (11/15 0430) Weight:  [76.2 kg (167 lb 15.9 oz)] 76.2 kg (167 lb 15.9 oz) (11/15 0200) HEMODYNAMICS:   VENTILATOR SETTINGS:   INTAKE / OUTPUT:  Intake/Output Summary (Last 24 hours) at 06/19/15 9758 Last data filed at 06/19/15 0407  Gross per 24 hour  Intake 1343.75 ml  Output      0 ml  Net 1343.75 ml    PHYSICAL EXAMINATION: General:  Chronically ill-appearing, no distress Neuro:  Awake alert, oriented 4, moves all 4 extremities HEENT:  Normocephalic/atraumatic, moon faces noted Cardiovascular:  Tachycardic, slight systolic murmur, cannot assess JVD Lungs:  Crackles bilaterally in upper lobes, diminished right lower lobe, normal respiratory effort Abdomen:  Bowel sounds positive, nontender nondistended Musculoskeletal:  Diminished bulk and tone Skin:  Diffuse anasarca with skin breakdown noted and sacral area  LABS:  CBC  Recent Labs Lab 06/18/15 2226 06/18/15 2320 06/19/15 0322  WBC  --  4.2 3.9*  HGB 18.7* 8.7* 10.2*  HCT 55.0* 26.0* 30.5*  PLT  --  56* 62*   Coag's No results for input(s): APTT, INR in the last 168 hours. BMET  Recent Labs Lab 06/18/15 2138 06/18/15  2226 06/18/15 2320 06/19/15 0322  NA 135 135 137 136  K 3.5 3.6 2.9* 3.0*  CL 102 99* 107 105  CO2 22  --  22 23  BUN 20 24* 17 14  CREATININE 0.71 0.60 0.48 0.44  GLUCOSE 179* 175* 138* 132*   Electrolytes  Recent Labs Lab 06/18/15 2138 06/18/15 2320 06/19/15 0322  CALCIUM 8.3* 7.2* 7.7*  MG  --   --  1.0*  PHOS  --   --  2.7   Sepsis Markers  Recent Labs Lab 06/18/15 2209 06/19/15 0038  LATICACIDVEN 1.54  --   PROCALCITON  --  0.59   ABG No results for input(s): PHART, PCO2ART, PO2ART in the last 168 hours. Liver Enzymes  Recent Labs Lab 06/18/15 2138 06/18/15 2320 06/19/15 0322   AST _0 ALT _1 ALKPHOS 68 53 63  BILITOT 1.1 1.3* 1.0  ALBUMIN 2.5* 2.0* 2.1*   Cardiac Enzymes  Recent Labs Lab 06/19/15 0039 06/19/15 0322  TROPONINI 0.06* 0.05*   Glucose No results for input(s): GLUCAP in the last 168 hours.  Imaging Ct Angio Chest Pe W/cm &/or Wo Cm  06/18/2015  CLINICAL DATA:  Shortness of breath and hypoxia. Rule out pulmonary embolus. EXAM: CT ANGIOGRAPHY CHEST WITH CONTRAST TECHNIQUE: Multidetector CT imaging of the chest was performed using the standard protocol during bolus administration of intravenous contrast. Multiplanar CT image reconstructions and MIPs were obtained to evaluate the vascular anatomy. CONTRAST:  142m OMNIPAQUE IOHEXOL 350 MG/ML SOLN COMPARISON:  Chest radiograph earlier this day.  PET-CT 04/24/2015. FINDINGS: There are no filling defects within the pulmonary arteries to suggest pulmonary embolus. There is dilatation of the main pulmonary artery measuring 3.7 cm that is unchanged from prior PET. The thoracic aorta is normal in caliber with mild atherosclerosis. No dissection or aneurysm. Heart is borderline in size. Right chest port remains in place, tip at the atrial caval junction. Increased size of right pleural effusion with dependent portion as well as fluid in the right major fissure. Minimal fluid in the right minor fissure. There is no left pleural effusion, and trace dependent pleural thickening is seen. There is no pericardial effusion. Ground-glass opacities in an upper lobe an perihilar predominant distribution consistent with pulmonary edema. There are multiple pulmonary nodules. Largest nodal conglomerate in the right lower lobe measures 2.2 cm, previously 2.4 cm. Right middle lobe nodule measures 12 mm, unchanged. Additional smaller nodules throughout the right lung are again seen, some partially obscured by the pleural effusion and ground-glass opacities. No definite new pulmonary nodules. No endobronchial lesions. No  definite hilar or mediastinal adenopathy. Bilobed shaped fluid collection in the left chest wall soft tissues is unchanged in appearance from prior. There are surgical clips in the both axilla. No axillary adenopathy. Air-filled esophagus containing minimal intraluminal debris, small hiatal hernia. Small sclerotic density within T12 vertebral body is unchanged, not previously hypermetabolic on PET. No new sclerotic or lytic lesions. Review of the MIP images confirms the above findings. IMPRESSION: 1. No pulmonary embolus. There is enlargement of main pulmonary artery that is unchanged from prior exam and may reflect pulmonary arterial hypertension. 2. Re-accumulation of moderate right pleural effusion with fluid dependently and in the right major fissure. Diffuse ground-glass opacities in a pattern consistent with pulmonary edema. Question heart failure. Infectious etiology could have a similar appearance but is felt less likely given distribution. 3. Multiple pulmonary nodules in the right lung, minimally decreased versus stable in size  from prior PET. Electronically Signed   By: Jeb Levering M.D.   On: 06/18/2015 23:25   Dg Chest Port 1 View  06/18/2015  CLINICAL DATA:  Initial evaluation for hypoxia, shortness of breath. EXAM: PORTABLE CHEST 1 VIEW COMPARISON:  Prior radiograph from 05/16/2015. FINDINGS: Right-sided Port-A-Cath in place. Moderate cardiomegaly is stable. Mediastinal silhouette is unchanged. A right pleural effusion is grossly stable relative to previous study. There is superimposed diffuse increased pulmonary vascular congestion with indistinctness of the interstitial markings, suggesting pulmonary edema. A small left pleural effusion likely present as well. No definite focal infiltrate. No pneumothorax. No acute osseus abnormality. Surgical clips overlie the axilla bilaterally. IMPRESSION: 1. Cardiomegaly with findings suggestive of mild diffuse pulmonary edema. 2. Similar right pleural  effusion. 3. Small left pleural effusion. Electronically Signed   By: Jeannine Boga M.D.   On: 06/18/2015 22:34   Images from her CT chest and CXR personally reviewed > see my description above Oncology notes reviewed where she is being treated with whole brain radiation, chemotherapy has been held due to her overall condition  ASSESSMENT / PLAN:  PULMONARY A: Recurrent malignant pleural effusion on the right: I agree that this is very likely a malignant process but performing a thoracentesis or pleural drainage is higher-risk considering her thrombocytopenia and recent talc pleurodesis. I have personally reviewed the images from her chest x-ray when she was hospitalized for recurrent breast cancer and metastatic disease in October. At that time she was noted to have inflammatory changes (consolidation) and a pleural effusion which improved dramatically after steroid therapy. That may be the best first approach. The differential diagnosis here includes pneumonia and pulmonary edema but I think those are less likely considering her lack of swelling and fever or cough. P:   I am going to resume steroid therapy with Decadron and repeat a chest x-ray tomorrow If the pleural effusion increases in size then we need to discuss her case with Dr. Caffie Pinto who previously placed her Pleurx catheter and perform the talc pleurodesis   CARDIOVASCULAR CVL R IJ port A: Sinus tachycardia with mild hypotension in the setting of diarrhea: Most likely representative of volume loss and anemia from GI losses P:  Continue IV fluid resuscitation for now Change solumedrol to hydrocortisone stress dose for mineralocorticoid effect  RENAL A:  Hypokalemia P:   Monitor BMET and UOP Replace electrolytes as needed   GASTROINTESTINAL A:  Diarrhea with some blood, known hemorrhoids, unclear if this represents a significant lower GI bleed but there has been a significant drop in her hemoglobin P:   Monitor  closely C. difficile precautions Monitor for blood in stool  HEMATOLOGIC A:  Symptomatic anemia in setting of possible lower GI bleed P:  Monitor for bleeding Transfuse to keep hemoglobin greater than 7 g/dL  INFECTIOUS A:  Currently on treatment for healthcare associated pneumonia, however given her lack of cough or fever at this is less likely I do not think she has sepsis, her vital signs are more consistent with anemia and hypovolemia from diarrhea P:   Antibiotics per [facility], would consider stopping them  ENDOCRINE A:  Hyperglycemia P:   Insulin therapy per [facility] hospitalist  NEUROLOGIC A:  Known brain metastasis P:   Per oncology  Global: Overall I think our goals of care here should be palliative in nature. Would consider consultation a palliative medicine once again. Of note, her CODE STATUS was DO NOT RESUSCITATE during the last admission.   FAMILY  -  Updates: none at bedside   Roselie Awkward, MD [facility] Pager: [phone] Cell: [phone] After 3pm or if no response, call [phone]

## 2015-06-19 NOTE — Progress Notes (Signed)
[facility] PROGRESS NOTE   Christine [name] [id number] DOB: June 01, 1956 DOA: 06/18/2015 PCP: Lucretia Kern., DO  HPI/Subjective: Feels OK denies any complaints, has tachycardia but denies any type of palpitations. Reported that has increased respiratory rate but she was not feeling she is short of breath.  Assessment/Plan: Active Problems:   Essential hypertension   Breast cancer of upper-outer quadrant of left female breast (HCC)   Lung metastasis (HCC)   Anemia of chronic disease   Thrombocytopenia (HCC)   Acute on chronic respiratory failure with hypoxia (HCC)   Recurrent pleural effusion on right   Diabetes mellitus, type II (HCC)   Acute respiratory failure with hypoxia (HCC)   Blood in stool   Hypotension   Pressure ulcer   . Acute respiratory failure with hypoxia  Admitted to the hospital with hypoxia and required 6 L of oxygen to keep saturation above 90%. CT angiography showed moderate right-sided pleural effusion with probable congestion. Seen by pulmonology and for now deferred thoracentesis. Procalcitonin of 0.59, started on antibiotics for treatment of possible HCAP  . Recurrent right pleural effusion This is probably malignant pleural effusion, patient has talk pleurodesis done previously in the same side. Seen by pulmonology deferred thoracentesis for now.   . Lung metastasis  Has known metastases to the lungs from metastatic breast cancer. I spoke with Dr. Langston Masker, he will consult the patient for further evaluation. Evaluate for further palliative therapy versus palliative care and hospice.  . Essential hypertension Hold blood pressure medications  . Breast cancer of upper-outer quadrant of left female breast Metastatic breast cancer, oncology to evaluate.  . Anemia of chronic disease Presented with hemoglobin of 8.6, currently 10.2 without intervention. Has anemia of chronic disease and chemotherapy-induced anemia.  .  Thrombocytopenia Platelets 9 yesterday, currently 62. This is could be secondary to the recent chemotherapy treatment. Oncology to evaluate.   Hypotension Had recent exposure to steroids, started on Solu-Cortef 50 mg every 6 hours. Possible secondary/tertiary adrenal insufficiency. Given also IV fluids as patient has recent diarrhea expected to have volume depletion.   Diarrhea Will obtain stool cultures and check for C. Difficile, fecal occult is positive because of hemorrhoids   Sepsis Has tachycardia, hypotension and tachypnea meets sepsis criteria with adding possible HCAP. Has procalcitonin of 0.59 continue broad-spectrum antibiotics. On IV fluids, continue current management.   Diabetes mellitus type 2 Controlled last hemoglobin A1c of 6.3, hold metformin. SSI and carbohydrate modified diet  Code Status: Full Code Family Communication: Plan discussed with the patient. Disposition Plan: Remains inpatient Diet: Diet Carb Modified Fluid consistency:: Thin; Room service appropriate?: Yes  Consultants:  PCCM  Procedures:  None  Antibiotics:  Ceftaz and Vancomycin   Objective: Filed Vitals:   06/19/15 1000  BP: 86/47  Pulse: 101  Temp:   Resp: 28    Intake/Output Summary (Last 24 hours) at 06/19/15 1103 Last data filed at 06/19/15 0600  Gross per 24 hour  Intake 1493.75 ml  Output      0 ml  Net 1493.75 ml   Filed Weights   06/19/15 0200  Weight: 76.2 kg (167 lb 15.9 oz)    Exam: General: Alert and awake, oriented x3, not in any acute distress. HEENT: anicteric sclera, pupils reactive to light and accommodation, EOMI CVS: S1-S2 clear, no murmur rubs or gallops Chest: clear to auscultation bilaterally, no wheezing, rales or rhonchi Abdomen: soft nontender, nondistended, normal bowel sounds, no organomegaly Extremities: no cyanosis, clubbing or edema noted bilaterally Neuro:  Cranial nerves II-XII intact, no focal neurological deficits  Data  Reviewed: Basic Metabolic Panel:  Recent Labs Lab 06/18/15 2138 06/18/15 2226 06/18/15 2320 06/19/15 0322  NA 135 135 137 136  K 3.5 3.6 2.9* 3.0*  CL 102 99* 107 105  CO2 22  --  22 23  GLUCOSE 179* 175* 138* 132*  BUN 20 24* 17 14  CREATININE 0.71 0.60 0.48 0.44  CALCIUM 8.3*  --  7.2* 7.7*  MG  --   --   --  1.0*  PHOS  --   --   --  2.7   Liver Function Tests:  Recent Labs Lab 06/18/15 2138 06/18/15 2320 06/19/15 0322  AST 27 22 23   ALT 19 17 18   ALKPHOS 68 53 63  BILITOT 1.1 1.3* 1.0  PROT 5.6* 4.4* 5.0*  ALBUMIN 2.5* 2.0* 2.1*    Recent Labs Lab 06/18/15 2138 06/18/15 2320  LIPASE 16 15   No results for input(s): AMMONIA in the last 168 hours. CBC:  Recent Labs Lab 06/18/15 2226 06/18/15 2320 06/19/15 0322  WBC  --  4.2 3.9*  NEUTROABS  --  3.3  --   HGB 18.7* 8.7* 10.2*  HCT 55.0* 26.0* 30.5*  MCV  --  98.1 100.3*  PLT  --  56* 62*   Cardiac Enzymes:  Recent Labs Lab 06/19/15 0039 06/19/15 0322  TROPONINI 0.06* 0.05*   BNP (last 3 results)  Recent Labs  10/30/14 1300  BNP 55.3    ProBNP (last 3 results) No results for input(s): PROBNP in the last 8760 hours.  CBG:  Recent Labs Lab 06/19/15 0744  GLUCAP 173*    Micro Recent Results (from the past 240 hour(s))  MRSA PCR Screening     Status: None   Collection Time: 06/19/15  3:20 AM  Result Value Ref Range Status   MRSA by PCR NEGATIVE NEGATIVE Final    Comment:        The GeneXpert MRSA Assay (FDA approved for NASAL specimens only), is one component of a comprehensive MRSA colonization surveillance program. It is not intended to diagnose MRSA infection nor to guide or monitor treatment for MRSA infections.      Studies: Ct Angio Chest Pe W/cm &/or Wo Cm  06/18/2015  CLINICAL DATA:  Shortness of breath and hypoxia. Rule out pulmonary embolus. EXAM: CT ANGIOGRAPHY CHEST WITH CONTRAST TECHNIQUE: Multidetector CT imaging of the chest was performed using the  standard protocol during bolus administration of intravenous contrast. Multiplanar CT image reconstructions and MIPs were obtained to evaluate the vascular anatomy. CONTRAST:  166mL OMNIPAQUE IOHEXOL 350 MG/ML SOLN COMPARISON:  Chest radiograph earlier this day.  PET-CT 04/24/2015. FINDINGS: There are no filling defects within the pulmonary arteries to suggest pulmonary embolus. There is dilatation of the main pulmonary artery measuring 3.7 cm that is unchanged from prior PET. The thoracic aorta is normal in caliber with mild atherosclerosis. No dissection or aneurysm. Heart is borderline in size. Right chest port remains in place, tip at the atrial caval junction. Increased size of right pleural effusion with dependent portion as well as fluid in the right major fissure. Minimal fluid in the right minor fissure. There is no left pleural effusion, and trace dependent pleural thickening is seen. There is no pericardial effusion. Ground-glass opacities in an upper lobe an perihilar predominant distribution consistent with pulmonary edema. There are multiple pulmonary nodules. Largest nodal conglomerate in the right lower lobe measures 2.2 cm, previously 2.4 cm. Right  middle lobe nodule measures 12 mm, unchanged. Additional smaller nodules throughout the right lung are again seen, some partially obscured by the pleural effusion and ground-glass opacities. No definite new pulmonary nodules. No endobronchial lesions. No definite hilar or mediastinal adenopathy. Bilobed shaped fluid collection in the left chest wall soft tissues is unchanged in appearance from prior. There are surgical clips in the both axilla. No axillary adenopathy. Air-filled esophagus containing minimal intraluminal debris, small hiatal hernia. Small sclerotic density within T12 vertebral body is unchanged, not previously hypermetabolic on PET. No new sclerotic or lytic lesions. Review of the MIP images confirms the above findings. IMPRESSION: 1. No  pulmonary embolus. There is enlargement of main pulmonary artery that is unchanged from prior exam and may reflect pulmonary arterial hypertension. 2. Re-accumulation of moderate right pleural effusion with fluid dependently and in the right major fissure. Diffuse ground-glass opacities in a pattern consistent with pulmonary edema. Question heart failure. Infectious etiology could have a similar appearance but is felt less likely given distribution. 3. Multiple pulmonary nodules in the right lung, minimally decreased versus stable in size from prior PET. Electronically Signed   By: Jeb Levering M.D.   On: 06/18/2015 23:25   Dg Chest Port 1 View  06/18/2015  CLINICAL DATA:  Initial evaluation for hypoxia, shortness of breath. EXAM: PORTABLE CHEST 1 VIEW COMPARISON:  Prior radiograph from 05/16/2015. FINDINGS: Right-sided Port-A-Cath in place. Moderate cardiomegaly is stable. Mediastinal silhouette is unchanged. A right pleural effusion is grossly stable relative to previous study. There is superimposed diffuse increased pulmonary vascular congestion with indistinctness of the interstitial markings, suggesting pulmonary edema. A small left pleural effusion likely present as well. No definite focal infiltrate. No pneumothorax. No acute osseus abnormality. Surgical clips overlie the axilla bilaterally. IMPRESSION: 1. Cardiomegaly with findings suggestive of mild diffuse pulmonary edema. 2. Similar right pleural effusion. 3. Small left pleural effusion. Electronically Signed   By: Jeannine Boga M.D.   On: 06/18/2015 22:34    Scheduled Meds: . antiseptic oral rinse  7 mL Mouth Rinse BID  . cefTAZidime (FORTAZ)  IV  2 g Intravenous 3 times per day  . dexamethasone  4 mg Intravenous 4 times per day  . fludrocortisone  0.1 mg Oral Daily  . hydrocortisone  1 application Rectal BID  . hydrocortisone sodium succinate  50 mg Intravenous 4 times per day  . insulin aspart  0-5 Units Subcutaneous QHS  .  insulin aspart  0-9 Units Subcutaneous TID WC  . latanoprost  1 drop Both Eyes QHS  . levothyroxine  50 mcg Oral QAC breakfast  . loratadine  10 mg Oral Daily  . nystatin  5 mL Mouth/Throat QID  . sodium chloride  3 mL Intravenous Q12H  . timolol  1 drop Both Eyes BID  . vancomycin  1,000 mg Intravenous BID   Continuous Infusions: . sodium chloride 75 mL/hr at 06/19/15 0245       Time spent: 35 minutes    [facility] Pager [phone] If 7PM-7AM, please contact night-coverage at [email or web address], password [facility] 06/19/2015, 11:03 AM  LOS: 0 days

## 2015-06-19 NOTE — Telephone Encounter (Signed)
CALLED PATIENT TO INFORM OF PT APPT. ON 2015/07/01 - ARRIVAL TIME - 3 PM @ [facility], LVM FOR A RETURN CALL

## 2015-06-19 NOTE — Care Management Note (Signed)
Case Management Note  Patient Details  Name: Christine [name] MRN: [id number] Date of Birth: Jul 06, 1956  Subjective/Objective:       Copd exacerbation with pleural effusion in patient with pigtail caths             Action/Plan:Date: June 19, 2015 Chart reviewed for concurrent status and case management needs. Will continue to follow patient for changes and needs: [name], RN, BSN, [place]   [phone]   Expected Discharge Date:   (UNKNOWN)               Expected Discharge Plan:  Home/Self Care  In-House Referral:  NA  Discharge planning Services  CM Consult  Post Acute Care Choice:  NA Choice offered to:  NA  DME Arranged:  N/A DME Agency:  NA  HH Arranged:  NA HH Agency:  NA  Status of Service:  In process, will continue to follow  Medicare Important Message Given:    Date Medicare IM Given:    Medicare IM give by:    Date Additional Medicare IM Given:    Additional Medicare Important Message give by:     If discussed at [place] of Stay Meetings, dates discussed:    Additional Comments:  Leeroy Cha, RN 06/19/2015, 9:34 AM

## 2015-06-19 NOTE — Progress Notes (Signed)
ANTIBIOTIC CONSULT NOTE - INITIAL  Pharmacy Consult for Vancomycin, ceftazidime Indication: HCAP  Allergies  Allergen Reactions  . Sulfa Antibiotics Hives, Itching and Swelling  . Sulfonamide Derivatives Hives, Itching and Swelling  . Tomato Hives and Itching    Patient Measurements: Weight: 167 lb 15.9 oz (76.2 kg) Adjusted Body Weight:   Vital Signs: Temp: 99.2 F (37.3 C) (11/14 1802) Temp Source: Oral (11/14 1802) BP: 88/52 mmHg (11/15 0145) Pulse Rate: 107 (11/15 0145) Intake/Output from previous day:   Intake/Output from this shift:    Labs:  Recent Labs  06/18/15 2138 06/18/15 2226 06/18/15 2320  WBC  --   --  4.2  HGB  --  18.7* 8.7*  PLT  --   --  56*  CREATININE 0.71 0.60 0.48   Estimated Creatinine Clearance: 75.7 mL/min (by C-G formula based on Cr of 0.48). No results for input(s): VANCOTROUGH, VANCOPEAK, VANCORANDOM, GENTTROUGH, GENTPEAK, GENTRANDOM, TOBRATROUGH, TOBRAPEAK, TOBRARND, AMIKACINPEAK, AMIKACINTROU, AMIKACIN in the last 72 hours.   Microbiology: No results found for this or any previous visit (from the past 720 hour(s)).  Medical History: Past Medical History  Diagnosis Date  . ALLERGIC RHINITIS   . GERD (gastroesophageal reflux disease)   . Hypertension   . Hypothyroidism   . Hx of colonic polyps   . Wears glasses   . Radiation 09/15/13-11/01/13    Left chest wall/PAB/scar/supraclavicular fossa  . SYNCOPE 05/15/2010    Qualifier: Diagnosis of  By: Arnoldo Morale MD, [place], WITH RADICULOPATHY 10/01/2007    Qualifier: Diagnosis of  By: Arnoldo Morale MD, [place] 05/31/2007    Qualifier: Diagnosis of  By: Arnoldo Morale MD, [place], HX OF 05/31/2007    Qualifier: Diagnosis of  By: Arnoldo Morale MD, [place]inda Quails   . Lymph edema L arm from breast ca tx 12/22/2013  . H/O blood clots     hx of small blood clots in both legs  . Diabetes mellitus without complication ([place])     type 2 - on Metformin  . Anxiety   .  Neuropathy due to chemotherapeutic drug ([place])   . Breast cancer ([place]) 1988, age 41    right  . Breast cancer ([place]) 09/30/12    left, ER/PR -, Her 2 -  . Secondary lung cancer ([place]) 2016  . Anemia     during chemo  . History of IBS   . Glaucoma   . Dry skin   . Lymphedema of arm     left arm, post mastectomy  . Shortness of breath dyspnea     with exertion (since onset of pleural effusion) Wears O2 2 L as needed  . Chronic respiratory failure; On Home oxygen 2L since 12/03/2014 02/11/2015  . S/P radiation therapy 10/26/13-04/25/2015    Whole Brain 35 Gy at 2.5 Gy per fraction x 14 Fractions    Medications:  Anti-infectives    Start     Dose/Rate Route Frequency Ordered Stop   06/19/15 0230  vancomycin (VANCOCIN) IVPB 1000 mg/200 mL premix     1,000 mg 200 mL/hr over 60 Minutes Intravenous 2 times daily 06/19/15 0223     06/19/15 0230  cefTAZidime (FORTAZ) 2 g in dextrose 5 % 50 mL IVPB     2 g 100 mL/hr over 30 Minutes Intravenous 3 times per day 06/19/15 0223       Assessment: Patient with breast cancer with metastasis to lung and brain.  Patient with respiratory failure with hypoxia and hypotension secondary to discontinuation of steroids.  First dose of antibiotics already given.   Goal of Therapy:  Vancomycin trough level 15-20 mcg/ml  Ceftazidime based on renal function  Appropriate antibiotic dosing for renal function; eradication of infection   Plan:  Measure antibiotic drug levels at steady state Follow up culture results Vancomycin 1gm iv q12hr  Ceftazidime 2gm iv q8hr  Tyler Deis, Shea Stakes [place] 06/19/2015,2:30 AM

## 2015-06-19 NOTE — Progress Notes (Signed)
Inpatient Diabetes Program Recommendations  AACE/ADA: New Consensus Statement on Inpatient Glycemic Control (2015)  Target Ranges:  Prepandial:   less than 140 mg/dL      Peak postprandial:   less than 180 mg/dL (1-2 hours)      Critically ill patients:  140 - 180 mg/dL   Review of Glycemic Control  Diabetes history: DM2 Outpatient Diabetes medications: metformin 1000 mg in am and 500 QHS Current orders for Inpatient glycemic control: Novolog sensitive tidwc and hs  Results for SARINA, FOUT (MRN [id number]) as of 06/19/2015 16:12  Ref. Range 06/19/2015 07:44 06/19/2015 12:01 06/19/2015 15:56  Glucose-Capillary Latest Ref Range: 65-99 mg/dL 173 (H) 221 (H) 274 (H)   Hyperglycemia in the setting of steroids. May benefit from meal coverage insulin.   Inpatient Diabetes Program Recommendations:  Insulin - Meal Coverage: Add Novolog 3 units tidwc for meal coverage insulin while on steroids   Will continue to follow. Thank you. Lorenda Peck, RD, LDN, CDE Inpatient Diabetes Coordinator [phone]

## 2015-06-20 ENCOUNTER — Inpatient Hospital Stay (HOSPITAL_COMMUNITY): Payer: BLUE CROSS/BLUE SHIELD

## 2015-06-20 DIAGNOSIS — K921 Melena: Secondary | ICD-10-CM

## 2015-06-20 DIAGNOSIS — C50412 Malignant neoplasm of upper-outer quadrant of left female breast: Secondary | ICD-10-CM

## 2015-06-20 DIAGNOSIS — I959 Hypotension, unspecified: Secondary | ICD-10-CM

## 2015-06-20 DIAGNOSIS — E119 Type 2 diabetes mellitus without complications: Secondary | ICD-10-CM

## 2015-06-20 DIAGNOSIS — Z794 Long term (current) use of insulin: Secondary | ICD-10-CM

## 2015-06-20 DIAGNOSIS — D638 Anemia in other chronic diseases classified elsewhere: Secondary | ICD-10-CM

## 2015-06-20 DIAGNOSIS — I10 Essential (primary) hypertension: Secondary | ICD-10-CM

## 2015-06-20 LAB — GLUCOSE, CAPILLARY
GLUCOSE-CAPILLARY: 319 mg/dL — AB (ref 65–99)
GLUCOSE-CAPILLARY: 365 mg/dL — AB (ref 65–99)
GLUCOSE-CAPILLARY: 290 mg/dL — AB (ref 65–99)
Glucose-Capillary: 300 mg/dL — ABNORMAL HIGH (ref 65–99)
Glucose-Capillary: 332 mg/dL — ABNORMAL HIGH (ref 65–99)

## 2015-06-20 LAB — BASIC METABOLIC PANEL
Anion gap: 4 — ABNORMAL LOW (ref 5–15)
BUN: 13 mg/dL (ref 6–20)
CALCIUM: 8.3 mg/dL — AB (ref 8.9–10.3)
CO2: 26 mmol/L (ref 22–32)
CREATININE: 0.44 mg/dL (ref 0.44–1.00)
Chloride: 107 mmol/L (ref 101–111)
Glucose, Bld: 348 mg/dL — ABNORMAL HIGH (ref 65–99)
Potassium: 4.6 mmol/L (ref 3.5–5.1)
SODIUM: 137 mmol/L (ref 135–145)

## 2015-06-20 LAB — CBC
HCT: 28.8 % — ABNORMAL LOW (ref 36.0–46.0)
Hemoglobin: 9.4 g/dL — ABNORMAL LOW (ref 12.0–15.0)
MCH: 32.5 pg (ref 26.0–34.0)
MCHC: 32.6 g/dL (ref 30.0–36.0)
MCV: 99.7 fL (ref 78.0–100.0)
PLATELETS: 67 10*3/uL — AB (ref 150–400)
RBC: 2.89 MIL/uL — AB (ref 3.87–5.11)
RDW: 16.4 % — AB (ref 11.5–15.5)
WBC: 4.6 10*3/uL (ref 4.0–10.5)

## 2015-06-20 LAB — LEGIONELLA PNEUMOPHILA SEROGP 1 UR AG: L. PNEUMOPHILA SEROGP 1 UR AG: NEGATIVE

## 2015-06-20 LAB — URINE CULTURE

## 2015-06-20 LAB — HEMOGLOBIN A1C
Hgb A1c MFr Bld: 9.2 % — ABNORMAL HIGH (ref 4.8–5.6)
Mean Plasma Glucose: 217 mg/dL

## 2015-06-20 LAB — MAGNESIUM: MAGNESIUM: 2.3 mg/dL (ref 1.7–2.4)

## 2015-06-20 MED ORDER — INSULIN GLARGINE 100 UNIT/ML ~~LOC~~ SOLN
11.0000 [IU] | Freq: Every day | SUBCUTANEOUS | Status: DC
  Administered 2015-06-20 – 2015-06-22 (×3): 11 [IU] via SUBCUTANEOUS
  Filled 2015-06-20 (×4): qty 0.11

## 2015-06-20 MED ORDER — INSULIN ASPART 100 UNIT/ML ~~LOC~~ SOLN
0.0000 [IU] | Freq: Every day | SUBCUTANEOUS | Status: DC
  Administered 2015-06-20: 4 [IU] via SUBCUTANEOUS
  Administered 2015-06-21: 2 [IU] via SUBCUTANEOUS

## 2015-06-20 MED ORDER — PHENOL 1.4 % MT LIQD
1.0000 | OROMUCOSAL | Status: DC | PRN
  Administered 2015-06-20: 1 via OROMUCOSAL
  Filled 2015-06-20: qty 177

## 2015-06-20 MED ORDER — INSULIN ASPART 100 UNIT/ML ~~LOC~~ SOLN
0.0000 [IU] | Freq: Three times a day (TID) | SUBCUTANEOUS | Status: DC
  Administered 2015-06-20: 8 [IU] via SUBCUTANEOUS
  Administered 2015-06-20: 15 [IU] via SUBCUTANEOUS
  Administered 2015-06-21: 5 [IU] via SUBCUTANEOUS
  Administered 2015-06-21: 11 [IU] via SUBCUTANEOUS
  Administered 2015-06-21: 15 [IU] via SUBCUTANEOUS
  Administered 2015-06-22 (×3): 5 [IU] via SUBCUTANEOUS
  Administered 2015-06-23: 3 [IU] via SUBCUTANEOUS

## 2015-06-20 NOTE — Progress Notes (Signed)
[facility]                                                                              Patient Demographics  Christine [name], is a 59 y.o. female, DOB - 1955-08-24, [id number]  Admit date - 06/18/2015   Admitting Physician Toy Baker, MD  Outpatient Primary MD for the patient is Lucretia Kern., DO  LOS - 1   Chief Complaint  Patient presents with  . Diarrhea  . Anorexia      HPI on 06/18/2015 by Dr. 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 She status post chemotherapy and radiation therapy inducing thrombocytopenia and anemia. Patient has known history of recurrent pleural effusions and chronic hypoxia. Chemotherapy currently has been held secondary to patient's poor performance status. Her brain metastasis was treated with dexamethasone which has been discontinued she had an MRI of the brain done on 11 foot did show excellent response. She was left with chronic left leg weakness but did not elect to have any physical therapy. Patient has known history of hemorrhoids but usually there hasn't been any bleeding. Today patient developed worsening diarrhea and some blood in stool with worsening hemorrhoids. Her oxygen was checked at home and it was again showed to be low at which point her family put her on oxygen and brought her back to emergency department. Patient reports poor by mouth intake. Joya Gaskins to this patient apparently was off of oxygen for a few months. In emergency department she was found to be tachycardic and hypotensive with blood pressures as low 74/53 patient was given 2 L. CT scan of the chest was done showing no evidence of PE but there is a reaccumulation of moderate right pleural effusion as well as some pulmonary edema and persistent multiple pulmonary nodules. Patient was found to be anemic Hemoglobin down to 8.7 which is down from 12.3 on 10/ 21st.  But on 10/07 she was down to 8.6 as  well. Patient platelet count is down to 56 from 149 on October 21 which is also similar to prior results in the beginning of October.  Patient has history of diabetes was on insulin while on steroid but currently discontinued.  Patient reports for 2 days that the light has been bothering her eyes but denies any headache, no neck pain, no fever. She denies any nausea. Hospitalist was called for admission for hypotension in the setting of poor by mouth intake  Assessment & Plan   Acute history failure with hypoxia -Admitted to the hospital with hypoxia and required 6 L of oxygen to maintain saturations of 90% -Possibly secondary to right pleural effusion which is recurrent versus lung metastasis versus ?HCAP -CTA chest showed right-sided pleural effusion with probable congestion -Pulmonology consulted and appreciated  Recurrent right pleural effusion -Probably malignant pleural effusion -Patient s/p TALC pleurodesis -Continue steroids -Pulmonology consulted and appreciated, repeat chest x-ray pending  Sepsis -Patient was hypotensive, tachycardic and tachypneic upon admission with questionable pneumonia -Proalcitonin 0.59, patient was started on broad-spectrum antibiotics -Hypotension likely secondary to dehydration and diarrhea -Vital signs have stabilized -Given patient's lack of cough and fever at this time,  will discontinue antibiotics and continue to monitor closely -Strep pneumonia antigen negative, Legionella urine antigen pending  Breast cancer/Lung metastasis -Oncology consulted and appreciated  Essential hypertension -Home blood pressure medications held  Anemia of chronic disease -Upon admission, hemoglobin is 8.6 currently 9.4 -Anemia possibly secondary to chronic disease versus chemotherapy-induced  -Continue monitor CBC closely   Thrombocytopenia -Platelets improving, possibly secondary to chemotherapy -Continue to monitor CBC -Oncology consulted and  appreciated  Hypotension -Improved -Continue steroids, IV fluid -Possibly secondary to dehydration versus adrenal insufficiency  Diarrhea -Cdiff -FOBT +, possibly due to hemorrhoids   Diabetes mellitus, type II -Last hemoglobin A1c was 6.3 -Metformin held -Continue insulin sliding scale CBG monitoring  Goals of care -Spoke with patient regarding hospice as this was mentioned in oncology note, patient stated she did not want to go home with hospice as she did not feel she was dying. -In the past, patient has had DO NOT RESUSCITATE CODE STATUS, patient did not want to discuss this at this time -Spoke with patient regarding palliative care consult regarding symptom management and end-of-life goals and care, patient was agreeable. Consult placed -PT consulted  Code Status: Full   Family Communication: Not at bedside   Disposition Plan: Admitted, will transfer to telemetry  Time Spent in minutes   35 minutes  Procedures  None  Consults   Pulmonology Oncology Palliative care  DVT Prophylaxis  SCDs  Lab Results  Component Value Date   PLT 67* 06/20/2015    Medications  Scheduled Meds: . antiseptic oral rinse  7 mL Mouth Rinse BID  . cefTAZidime (FORTAZ)  IV  2 g Intravenous 3 times per day  . dexamethasone  4 mg Intravenous 4 times per day  . hydrocortisone  1 application Rectal BID  . hydrocortisone sodium succinate  50 mg Intravenous 4 times per day  . insulin aspart  0-5 Units Subcutaneous QHS  . insulin aspart  0-9 Units Subcutaneous TID WC  . latanoprost  1 drop Both Eyes QHS  . levothyroxine  50 mcg Oral QAC breakfast  . loratadine  10 mg Oral Daily  . nystatin  5 mL Mouth/Throat QID  . sodium chloride  3 mL Intravenous Q12H  . timolol  1 drop Both Eyes BID  . vancomycin  1,000 mg Intravenous BID   Continuous Infusions:  PRN Meds:.acetaminophen **OR** acetaminophen, HYDROcodone-acetaminophen, ondansetron **OR** ondansetron (ZOFRAN) IV  Antibiotics    Anti-infectives    Start     Dose/Rate Route Frequency Ordered Stop   06/19/15 0230  vancomycin (VANCOCIN) IVPB 1000 mg/200 mL premix     1,000 mg 200 mL/hr over 60 Minutes Intravenous 2 times daily 06/19/15 0223     06/19/15 0230  cefTAZidime (FORTAZ) 2 g in dextrose 5 % 50 mL IVPB     2 g 100 mL/hr over 30 Minutes Intravenous 3 times per day 06/19/15 0223        Subjective:   Wanisha Klapper seen and examined today.  Patient states she is feeling better. Denies any cough. Does have some shortness of breath. Would like to get out of bed. Currently denies chest pain, dizziness, headache, abdominal pain, nausea or vomiting.    Objective:   Filed Vitals:   06/20/15 0400 06/20/15 0500 06/20/15 0700 06/20/15 0800  BP: 91/54 103/62 114/50   Pulse: 85 84 89   Temp: 97.9 F (36.6 C)   97.5 F (36.4 C)  TempSrc: Oral   Oral  Resp: 17 20 25    Height:  Weight: 78.2 kg (172 lb 6.4 oz)     SpO2: 95% 94% 94%     Wt Readings from Last 3 Encounters:  06/20/15 78.2 kg (172 lb 6.4 oz)  05/25/15 76.204 kg (168 lb)  05/16/15 76.658 kg (169 lb)     Intake/Output Summary (Last 24 hours) at 06/20/15 0930 Last data filed at 06/19/15 2139  Gross per 24 hour  Intake    775 ml  Output      0 ml  Net    775 ml    Exam  General: Well developed, chronically ill-appearing, NAD   HEENT: NCAT, mucous membranes moist.   Cardiovascular: S1 S2 auscultated, soft SEM, Regular rate and rhythm.  Respiratory: Right sided crackles, diminished breath sounds   Abdomen: Soft, nontender, nondistended, + bowel sounds  Extremities: warm dry without cyanosis clubbing or edema  Neuro: AAOx3, nonfocal  Psych: Normal affect and demeanor with intact judgement and insight  Data Review   Micro Results Recent Results (from the past 240 hour(s))  MRSA PCR Screening     Status: None   Collection Time: 06/19/15  3:20 AM  Result Value Ref Range Status   MRSA by PCR NEGATIVE NEGATIVE Final    Comment:         The GeneXpert MRSA Assay (FDA approved for NASAL specimens only), is one component of a comprehensive MRSA colonization surveillance program. It is not intended to diagnose MRSA infection nor to guide or monitor treatment for MRSA infections.     Radiology Reports Ct Angio Chest Pe W/cm &/or Wo Cm  06/18/2015  CLINICAL DATA:  Shortness of breath and hypoxia. Rule out pulmonary embolus. EXAM: CT ANGIOGRAPHY CHEST WITH CONTRAST TECHNIQUE: Multidetector CT imaging of the chest was performed using the standard protocol during bolus administration of intravenous contrast. Multiplanar CT image reconstructions and MIPs were obtained to evaluate the vascular anatomy. CONTRAST:  147mL OMNIPAQUE IOHEXOL 350 MG/ML SOLN COMPARISON:  Chest radiograph earlier this day.  PET-CT 04/24/2015. FINDINGS: There are no filling defects within the pulmonary arteries to suggest pulmonary embolus. There is dilatation of the main pulmonary artery measuring 3.7 cm that is unchanged from prior PET. The thoracic aorta is normal in caliber with mild atherosclerosis. No dissection or aneurysm. Heart is borderline in size. Right chest port remains in place, tip at the atrial caval junction. Increased size of right pleural effusion with dependent portion as well as fluid in the right major fissure. Minimal fluid in the right minor fissure. There is no left pleural effusion, and trace dependent pleural thickening is seen. There is no pericardial effusion. Ground-glass opacities in an upper lobe an perihilar predominant distribution consistent with pulmonary edema. There are multiple pulmonary nodules. Largest nodal conglomerate in the right lower lobe measures 2.2 cm, previously 2.4 cm. Right middle lobe nodule measures 12 mm, unchanged. Additional smaller nodules throughout the right lung are again seen, some partially obscured by the pleural effusion and ground-glass opacities. No definite new pulmonary nodules. No  endobronchial lesions. No definite hilar or mediastinal adenopathy. Bilobed shaped fluid collection in the left chest wall soft tissues is unchanged in appearance from prior. There are surgical clips in the both axilla. No axillary adenopathy. Air-filled esophagus containing minimal intraluminal debris, small hiatal hernia. Small sclerotic density within T12 vertebral body is unchanged, not previously hypermetabolic on PET. No new sclerotic or lytic lesions. Review of the MIP images confirms the above findings. IMPRESSION: 1. No pulmonary embolus. There is enlargement of main pulmonary  artery that is unchanged from prior exam and may reflect pulmonary arterial hypertension. 2. Re-accumulation of moderate right pleural effusion with fluid dependently and in the right major fissure. Diffuse ground-glass opacities in a pattern consistent with pulmonary edema. Question heart failure. Infectious etiology could have a similar appearance but is felt less likely given distribution. 3. Multiple pulmonary nodules in the right lung, minimally decreased versus stable in size from prior PET. Electronically Signed   By: Jeb Levering M.D.   On: 06/18/2015 23:25   Mr [name] [id number] Contrast  06/14/2015  CLINICAL DATA:  Metastatic breast cancer. Completion of whole-brain radiation. EXAM: MRI HEAD WITHOUT AND WITH CONTRAST TECHNIQUE: Multiplanar, multiecho pulse sequences of the brain and surrounding structures were obtained without and with intravenous contrast. CONTRAST:  45mL MULTIHANCE GADOBENATE DIMEGLUMINE 529 MG/ML IV SOLN COMPARISON:  MRI head 05/09/2015 FINDINGS: Numerous metastatic deposits in the brain. Many of these show restricted diffusion felt to be due to tumor necrosis. This was present previously. Decreased edema around lesions in the right parietal lobe and left parietal lobe. Minimal edema seen on today's study. No shift of the midline structures. Ventricle size normal. Negative for intracranial hemorrhage.  Right cerebellar lesion posteriorly has improved in size now measuring 9 x 5.6 mm. Left cerebellar lesion has improved now measuring 7 x 8 mm. Right occipital parietal lesion improved now measuring 5 mm. Left occipital parietal lesion has improved since the prior study. Left frontal necrotic lesion is unchanged measuring 6 mm. Numerous punctate small lesions are present throughout both cerebral hemispheres which are similar to improved. IMPRESSION: No new lesions identified. Extensive metastatic disease to the brain. Overall the lesions are smaller compared with the prior study with less edema around several lesions. No new lesions. Electronically Signed   By: Franchot Gallo M.D.   On: 06/14/2015 11:36   Dg Chest Port 1 View  06/18/2015  CLINICAL DATA:  Initial evaluation for hypoxia, shortness of breath. EXAM: PORTABLE CHEST 1 VIEW COMPARISON:  Prior radiograph from 05/16/2015. FINDINGS: Right-sided Port-A-Cath in place. Moderate cardiomegaly is stable. Mediastinal silhouette is unchanged. A right pleural effusion is grossly stable relative to previous study. There is superimposed diffuse increased pulmonary vascular congestion with indistinctness of the interstitial markings, suggesting pulmonary edema. A small left pleural effusion likely present as well. No definite focal infiltrate. No pneumothorax. No acute osseus abnormality. Surgical clips overlie the axilla bilaterally. IMPRESSION: 1. Cardiomegaly with findings suggestive of mild diffuse pulmonary edema. 2. Similar right pleural effusion. 3. Small left pleural effusion. Electronically Signed   By: Jeannine Boga M.D.   On: 06/18/2015 22:34    CBC  Recent Labs Lab 06/18/15 2226 06/18/15 2320 06/19/15 0322 06/20/15 0500  WBC  --  4.2 3.9* 4.6  HGB 18.7* 8.7* 10.2* 9.4*  HCT 55.0* 26.0* 30.5* 28.8*  PLT  --  56* 62* 67*  MCV  --  98.1 100.3* 99.7  MCH  --  32.8 33.6 32.5  MCHC  --  33.5 33.4 32.6  RDW  --  16.2* 16.6* 16.4*   LYMPHSABS  --  0.7  --   --   MONOABS  --  0.2  --   --   EOSABS  --  0.0  --   --   BASOSABS  --  0.0  --   --     Chemistries   Recent Labs Lab 06/18/15 2138 06/18/15 2226 06/18/15 2320 06/19/15 0322 06/20/15 0500  NA 135 135 137 136 137  K 3.5 3.6 2.9* 3.0* 4.6  CL 102 99* 107 105 107  CO2 22  --  22 23 26   GLUCOSE 179* 175* 138* 132* 348*  BUN 20 24* 17 14 13   CREATININE 0.71 0.60 0.48 0.44 0.44  CALCIUM 8.3*  --  7.2* 7.7* 8.3*  MG  --   --   --  1.0* 2.3  AST 27  --  22 23  --   ALT 19  --  17 18  --   ALKPHOS 68  --  53 63  --   BILITOT 1.1  --  1.3* 1.0  --    ------------------------------------------------------------------------------------------------------------------ estimated creatinine clearance is 76.6 mL/min (by C-G formula based on Cr of 0.44). ------------------------------------------------------------------------------------------------------------------  Recent Labs  06/19/15 0322  HGBA1C 9.2*   ------------------------------------------------------------------------------------------------------------------ No results for input(s): CHOL, HDL, LDLCALC, TRIG, CHOLHDL, LDLDIRECT in the last 72 hours. ------------------------------------------------------------------------------------------------------------------  Recent Labs  06/19/15 0500  TSH 0.890   ------------------------------------------------------------------------------------------------------------------ No results for input(s): VITAMINB12, FOLATE, FERRITIN, TIBC, IRON, RETICCTPCT in the last 72 hours.  Coagulation profile No results for input(s): INR, PROTIME in the last 168 hours.  No results for input(s): DDIMER in the last 72 hours.  Cardiac Enzymes  Recent Labs Lab 06/19/15 0039 06/19/15 0322 06/19/15 1240  TROPONINI 0.06* 0.05* 0.04*   ------------------------------------------------------------------------------------------------------------------ Invalid  input(s): POCBNP    Mialee Weyman D.O. on 06/20/2015 at 9:30 AM  Between 7am to 7pm - Pager - [phone]  After 7pm go to [email or web address] - password [email or web address]  And look for the night coverage person covering for me after hours  [facility] Office  [phone]

## 2015-06-20 NOTE — Progress Notes (Signed)
Inpatient Diabetes Program Recommendations  AACE/ADA: New Consensus Statement on Inpatient Glycemic Control (2015)  Target Ranges:  Prepandial:   less than 140 mg/dL      Peak postprandial:   less than 180 mg/dL (1-2 hours)      Critically ill patients:  140 - 180 mg/dL    Results for HAIDYNN, JINKERSON (MRN [id number]) as of 06/20/2015 09:02  Ref. Range 06/19/2015 07:44 06/19/2015 12:01 06/19/2015 15:56 06/19/2015 20:25  Glucose-Capillary Latest Ref Range: 65-99 mg/dL 173 (H) 221 (H) 274 (H) 300 (H)    Results for JAYLEY, MIDGETTE (MRN [id number]) as of 06/20/2015 09:02  Ref. Range 06/20/2015 07:41  Glucose-Capillary Latest Ref Range: 65-99 mg/dL 319 (H)    Current Insulin Orders: Novolog Sensitive SSI (0-9 units) TID AC + HS    -Patient currently receiving IV Decadron 4 mg QID, IV Solucortef 50 mg QID, and PO Florinef 0.1 mg daily.  -Glucose levels significantly elevated.   MD- If within goals of care for this patient, recommend the following in-hospital insulin adjustments while patient getting triple steroid therapy:  1. Start basal insulin- Lantus 11 units daily (0.15 units/kg dosing)  2. Increase Novolog SSI to Moderate scale (0-15 units) TID AC + HS     --Will follow patient during hospitalization--  Wyn Quaker RN, MSN, CDE Diabetes Coordinator Inpatient Glycemic Control Team Team Pager: [phone] (8a-5p)

## 2015-06-20 NOTE — Progress Notes (Signed)
Palliative full note to follow:  I met today with Christine [name] and husband today. She is very resistant to any discussion regarding end of life. Very clear she is against hospice and I did not bring up code status. Very difficult discussion. Focused on developing rapport today and will follow up with them tomorrow.   Vinie Sill, NP Palliative Medicine Team Pager # [phone] (M-F 8a-5p) Team Phone # [phone] (Nights/Weekends)

## 2015-06-20 NOTE — Clinical Documentation Improvement (Signed)
Internal Medicine  Per nursing assessment pressure ulcer stage II to sacrum.  Can the diagnosis of pressure ulcer be further specified? Thank you   Pressure Ulcer Stage - Stage1, Stage 2, Stage 3, Stage 4, Unstageable, Unspecified, Unable to Clinically Determine   Location of pressure ulcer  Other  Clinically Undetermined    Supporting Information:  Nurses assessment   Please exercise your independent, professional judgment when responding. A specific answer is not anticipated or expected.   Thank You,  [place] [phone]

## 2015-06-20 NOTE — Consult Note (Signed)
Consultation Note Date: 06/20/2015   Patient Name: Christine [name]  DOB: 1955/12/14  MRN: [id number]  Age / Sex: 59 y.o., female  PCP: Lucretia Kern, DO Referring Physician: Cristal Ford, DO  Reason for Consultation: Establishing goals of care    Clinical Assessment/Narrative: I met today with Ms. Christine [name] as well as her husband at bedside. I introduced myself and Ms. Christine [name] is very pleasant but says "I agreed to meet with you but I am doing fine so I don't know what there is to talk about." She is very avoidant of discussing her cancer and progression and changes the subject often. When assessing and asking about symptoms she denies any pain, discomfort, dyspnea, appetite changes, etc except for her husband explains that recently she has been very weak, sleeping a lot, and last few days decreased appetite. She continues to say "I'm fine - doing much better now." Mr. Virgo brought up that hospice had been mentioned and Ms. Christine [name] speaks firmly that she does not want hospice and doesn't need this yet - although says that her mother and other family members have had hospice but doesn't feel she needs this yet - "hospice is for people who are dying." She also tells me that she is reliant on her faith and says she is comfortable with whatever time God gives her but she is extremely anxious about discussing this. I did offer chaplain support but she declined.   She tells me that Dr. Lindi Adie came yesterday but she did not want to discuss her cancer with him but says she allowed him to speak with her sister. Husband says he is hoping to speak with him today. He asks about palliative care (vs hospice) and what we can provide. I explained that our role is discussing her goals for herself, helping with decisions, symptom management, and trying to help with expectations or disease process. Explained that I will continue to check in with them and  discuss while she is here in the hospital and that we can set them up with palliative NP upon discharge as well.   Today my focus has been on introduction and building rapport as Ms. Christine [name] was clear from the beginning she was not prepared to discuss her prognosis or cancer and I believe bringing up these topics will only close her off to future conversations that may be had - especially as we have not met before today.   Contacts/Participants in Discussion: Primary Decision Maker: Self and then husband    SUMMARY OF RECOMMENDATIONS  Code Status/Advance Care Planning: Full code - did not discuss today    Code Status Orders        Start     Ordered   06/19/15 0216  Full code   Continuous     06/19/15 0216       Symptom Management:   She denies any symptoms as this time except weakness. She expresses interest to work with PT.   Palliative Prophylaxis:   Bowel Regimen and Delirium Protocol   Psycho-social/Spiritual:  Support System: Adequate Desire for further Chaplaincy support:no Additional Recommendations: Caregiving  Support/Resources  Prognosis: < 6 months  Discharge Planning: Home with Palliative Services   Chief Complaint/ Primary Diagnoses: Present on Admission:  . Recurrent pleural effusion on right . Lung metastasis ([place]) . Essential hypertension . Breast cancer of upper-outer quadrant of left female breast ([place]) . Anemia of chronic disease . Acute on chronic respiratory failure with hypoxia ([place]) . Thrombocytopenia ([place]) . Acute  respiratory failure with hypoxia ([place]) . Blood in stool . Hypotension  I have reviewed the medical record, interviewed the patient and family, and examined the patient. The following aspects are pertinent.  Past Medical History  Diagnosis Date  . ALLERGIC RHINITIS   . GERD (gastroesophageal reflux disease)   . Hypertension   . Hypothyroidism   . Hx of colonic polyps   . Wears glasses   . Radiation 09/15/13-11/01/13     Left chest wall/PAB/scar/supraclavicular fossa  . SYNCOPE 05/15/2010    Qualifier: Diagnosis of  By: Arnoldo Morale MD, [place], WITH RADICULOPATHY 10/01/2007    Qualifier: Diagnosis of  By: Arnoldo Morale MD, [place] 05/31/2007    Qualifier: Diagnosis of  By: Arnoldo Morale MD, [place], HX OF 05/31/2007    Qualifier: Diagnosis of  By: Arnoldo Morale MD, [place]inda [place]   . Lymph edema L arm from breast ca tx 12/22/2013  . H/O blood clots     hx of small blood clots in both legs  . Diabetes mellitus without complication ([place])     type 2 - on Metformin  . Anxiety   . Neuropathy due to chemotherapeutic drug ([place])   . Breast cancer ([place]) 1988, age 26    right  . Breast cancer ([place]) 09/30/12    left, ER/PR -, Her 2 -  . Secondary lung cancer ([place]) 2016  . Anemia     during chemo  . History of IBS   . Glaucoma   . Dry skin   . Lymphedema of arm     left arm, post mastectomy  . Shortness of breath dyspnea     with exertion (since onset of pleural effusion) Wears O2 2 L as needed  . Chronic respiratory failure; On Home oxygen 2L since 12/03/2014 02/11/2015  . S/P radiation therapy 10/26/13-04/25/2015    Whole Brain 35 Gy at 2.5 Gy per fraction x 14 Fractions   Social History   Social History  . Marital Status: Married    Spouse Name: Donnie  . Number of Children: 1  . Years of Education: N/A   Occupational History  . Unemployed    Social History Main Topics  . Smoking status: Never Smoker   . Smokeless tobacco: Never Used  . Alcohol Use: No  . Drug Use: No  . Sexual Activity: Not Currently     Comment: menarche 12, 1st pregnancy age 58-miscarr, age 55 1st live birth, menop 42   Other Topics Concern  . None   Social History Narrative   Married. Lives w/ husband.  Has not worked since being diagnosed with cancer 2 years ago.  Previously worked as a Solicitor.  Independent with ADLs.      Spiritual Beliefs:Christian      Lifestyle: started the  [employer] exercise program for cancer survivors (12/2013); working on diet            Family History  Problem Relation Age of Onset  . Colon cancer Mother 47    family hx of colon ca 1st degree relative <60  . Hypertension Mother   . Coronary artery disease Father     family hx of CAD female 1st degree relative <50  . Stomach cancer Neg Hx   . Rectal cancer Neg Hx   . Esophageal cancer Neg Hx   . Breast cancer Maternal Grandmother 80  . Diabetes Paternal Grandmother   . Breast  cancer Cousin 35    maternal cousin  . Coronary artery disease Maternal Grandfather   . Breast cancer Sister     paternal half sister; died in her 15s   Scheduled Meds: . antiseptic oral rinse  7 mL Mouth Rinse BID  . cefTAZidime (FORTAZ)  IV  2 g Intravenous 3 times per day  . dexamethasone  4 mg Intravenous 4 times per day  . hydrocortisone  1 application Rectal BID  . hydrocortisone sodium succinate  50 mg Intravenous 4 times per day  . insulin aspart  0-15 Units Subcutaneous TID WC  . insulin aspart  0-5 Units Subcutaneous QHS  . insulin glargine  11 Units Subcutaneous Daily  . latanoprost  1 drop Both Eyes QHS  . levothyroxine  50 mcg Oral QAC breakfast  . loratadine  10 mg Oral Daily  . nystatin  5 mL Mouth/Throat QID  . sodium chloride  3 mL Intravenous Q12H  . timolol  1 drop Both Eyes BID  . vancomycin  1,000 mg Intravenous BID   Continuous Infusions:  PRN Meds:.acetaminophen **OR** acetaminophen, HYDROcodone-acetaminophen, ondansetron **OR** ondansetron (ZOFRAN) IV, phenol Medications Prior to Admission:  Prior to Admission medications   Medication Sig Start Date End Date Taking? Authorizing Provider  cetirizine (ZYRTEC) 10 MG tablet Take 10 mg by mouth daily as needed for allergies.    Yes Historical Provider, MD  cholecalciferol (VITAMIN D) 1000 UNITS tablet Take 1,000 Units by mouth every morning.    Yes Historical Provider, MD  diltiazem (CARDIZEM CD) 120 MG 24 hr capsule TAKE 1 CAPSULE (120  MG TOTAL) BY MOUTH EVERY MORNING. 05/28/15  Yes Lucretia Kern, DO  fluticasone (FLONASE) 50 MCG/ACT nasal spray Place 2 sprays into both nostrils daily as needed for allergies or rhinitis.   Yes Historical Provider, MD  hydrocortisone (ANUSOL-HC) 2.5 % rectal cream Place 1 application rectally 2 (two) times daily. 05/31/15  Yes Lucretia Kern, DO  HYDROmorphone (DILAUDID) 2 MG tablet Take 0.5 tablets (1 mg total) by mouth every 6 (six) hours as needed for severe pain. 05/25/15  Yes Nicholas Lose, MD  KLOR-CON M20 20 MEQ tablet TAKE 1 TABLET (20 MEQ TOTAL) BY MOUTH 2 (TWO) TIMES DAILY. 06/08/15  Yes Nicholas Lose, MD  latanoprost (XALATAN) 0.005 % ophthalmic solution Place 1 drop into both eyes at bedtime.    Yes Historical Provider, MD  levothyroxine (SYNTHROID, LEVOTHROID) 50 MCG tablet Take 1 tablet (50 mcg total) by mouth daily. 11/07/14  Yes Lucretia Kern, DO  metFORMIN (GLUCOPHAGE) 500 MG tablet Take $RemoveBef'1000mg'fZFAPzwDRy$  (2 tabs) in the morning and $RemoveBef'500mg'sArLOOdhOb$  (1 tab) in the evening with meals 02/23/15  Yes Lucretia Kern, DO  nystatin (MYCOSTATIN) 100000 UNIT/ML suspension TAKE 5 MLS (500,000 UNITS TOTAL) BY MOUTH 2 (TWO) TIMES DAILY. 05/14/15  Yes Nicholas Lose, MD  timolol (TIMOPTIC) 0.5 % ophthalmic solution Place 1 drop into both eyes 2 (two) times daily.   Yes Historical Provider, MD  Blood Glucose Monitoring Suppl (ONETOUCH VERIO) W/DEVICE KIT by Does not apply route. Per pharmacist at [facility]    Historical Provider, MD  dexamethasone (DECADRON) 2 MG tablet Take 1 tablet (2 mg total) by mouth 2 (two) times daily. Patient not taking: Reported on 06/18/2015 05/28/15   Nicholas Lose, MD  dexamethasone (DECADRON) 4 MG tablet Continue taking 4 mg tablet three time daily for one week until October 13th, 2016 and starting October 14th, 2016 start taking 4 mg tablet twice daily for one week. Dr.  Lindi Adie will see patient within next two weeks and will continue to taper down. Patient not taking: Reported on 06/18/2015 05/11/15    Theodis Blaze, MD  glucose blood [facility] VERIO) test strip Use as instructed to check blood sugar three times a day 05/31/15   Lucretia Kern, DO  insulin aspart (NOVOLOG) 100 UNIT/ML injection Inject 5 Units into the skin 3 (three) times daily with meals. Patient not taking: Reported on 06/18/2015 05/31/15   Lucretia Kern, DO  insulin glargine (LANTUS) 100 UNIT/ML injection Inject 0.1 mLs (10 Units total) into the skin at bedtime. Patient not taking: Reported on 06/18/2015 05/31/15   Lucretia Kern, DO  lidocaine-prilocaine (EMLA) cream Apply to affected area once Patient taking differently: Apply 1 application topically as needed (for chemo). Apply to affected area once 11/16/14   Nicholas Lose, MD  LORazepam (ATIVAN) 0.5 MG tablet Take 1 tablet (0.5 mg total) by mouth every 6 (six) hours as needed (Nausea or vomiting). Patient not taking: Reported on 06/18/2015 02/28/15   Nicholas Lose, MD  ondansetron (ZOFRAN) 8 MG tablet Take 1 tablet (8 mg total) by mouth every 8 (eight) hours as needed for nausea or vomiting. 04/30/15   Nicholas Lose, MD  prochlorperazine (COMPAZINE) 10 MG tablet Take 1 tablet (10 mg total) by mouth every 6 (six) hours as needed (Nausea or vomiting). 11/16/14   Nicholas Lose, MD  Tetrahydrozoline HCl (VISINE OP) Apply 1-2 drops to eye daily as needed (itchy eyes.).    Historical Provider, MD   Allergies  Allergen Reactions  . Sulfa Antibiotics Hives, Itching and Swelling  . Sulfonamide Derivatives Hives, Itching and Swelling  . Tomato Hives and Itching    Review of Systems  Constitutional: Positive for fatigue.  Respiratory: Positive for shortness of breath.   Neurological: Positive for weakness.    Physical Exam  Constitutional: She is oriented to person, place, and time. She appears well-developed.  HENT:  Head: Normocephalic.  Moon facies  Cardiovascular: Normal rate and regular rhythm.   Respiratory: Effort normal. No accessory muscle usage. No tachypnea. No  respiratory distress.  GI: Normal appearance.  Neurological: She is alert and oriented to person, place, and time.    Vital Signs: BP 100/56 mmHg  Pulse 95  Temp(Src) 97.5 F (36.4 C) (Oral)  Resp 24  Ht $R'5\' 4"'XW$  (1.626 m)  Wt 78.2 kg (172 lb 6.4 oz)  BMI 29.58 kg/m2  SpO2 96%  SpO2: SpO2: 96 % O2 Device:SpO2: 96 % O2 Flow Rate: .O2 Flow Rate (L/min): 6 L/min  IO: Intake/output summary:  Intake/Output Summary (Last 24 hours) at 06/20/15 1419 Last data filed at 06/20/15 1057  Gross per 24 hour  Intake    600 ml  Output      0 ml  Net    600 ml    LBM: Last BM Date:  (pta) Baseline Weight: Weight: 76.2 kg (167 lb 15.9 oz) Most recent weight: Weight: 78.2 kg (172 lb 6.4 oz)      Palliative Assessment/Data:  Flowsheet Rows        Most Recent Value   Intake Tab    Referral Department  Oncology   Unit at Time of Referral  ICU   Palliative Care Primary Diagnosis  Cancer   Date Notified  06/19/15   Palliative Care Type  Return patient Palliative Care   Reason for referral  Clarify Goals of Care, Counsel Regarding Hospice   Date of Admission  06/18/15   #  of days IP prior to Palliative referral  1   Clinical Assessment    Psychosocial & Spiritual Assessment    Palliative Care Outcomes       Additional Data Reviewed:  CBC:    Component Value Date/Time   WBC 4.6 06/20/2015 0500   WBC 11.8* 05/25/2015 1203   HGB 9.4* 06/20/2015 0500   HGB 12.3 05/25/2015 1203   HCT 28.8* 06/20/2015 0500   HCT 37.9 05/25/2015 1203   PLT 67* 06/20/2015 0500   PLT 149 05/25/2015 1203   MCV 99.7 06/20/2015 0500   MCV 98.6 05/25/2015 1203   NEUTROABS 3.3 06/18/2015 2320   NEUTROABS 10.4* 05/25/2015 1203   LYMPHSABS 0.7 06/18/2015 2320   LYMPHSABS 0.5* 05/25/2015 1203   MONOABS 0.2 06/18/2015 2320   MONOABS 0.8 05/25/2015 1203   EOSABS 0.0 06/18/2015 2320   EOSABS 0.0 05/25/2015 1203   BASOSABS 0.0 06/18/2015 2320   BASOSABS 0.0 05/25/2015 1203   Comprehensive Metabolic Panel:     Component Value Date/Time   NA 137 06/20/2015 0500   NA 135* 05/25/2015 1203   K 4.6 06/20/2015 0500   K 4.2 05/25/2015 1203   CL 107 06/20/2015 0500   CL 101 01/25/2013 0952   CO2 26 06/20/2015 0500   CO2 21* 05/25/2015 1203   BUN 13 06/20/2015 0500   BUN 29.0* 05/25/2015 1203   CREATININE 0.44 06/20/2015 0500   CREATININE 0.8 05/25/2015 1203   GLUCOSE 348* 06/20/2015 0500   GLUCOSE 195* 05/25/2015 1203   GLUCOSE 169* 01/25/2013 0952   CALCIUM 8.3* 06/20/2015 0500   CALCIUM 9.3 05/25/2015 1203   AST 23 06/19/2015 0322   AST 12 05/25/2015 1203   ALT 18 06/19/2015 0322   ALT 22 05/25/2015 1203   ALKPHOS 63 06/19/2015 0322   ALKPHOS 75 05/25/2015 1203   BILITOT 1.0 06/19/2015 0322   BILITOT 0.60 05/25/2015 1203   PROT 5.0* 06/19/2015 0322   PROT 5.7* 05/25/2015 1203   ALBUMIN 2.1* 06/19/2015 0322   ALBUMIN 3.2* 05/25/2015 1203     Time In: 1340 Time Out: 1440 Time Total: 54min Greater than 50%  of this time was spent counseling and coordinating care related to the above assessment and plan.  Signed by: Pershing Proud, NP  Pershing Proud, NP  08/12/3233, 2:19 PM  Please contact Palliative Medicine Team phone at [phone] for questions and concerns.

## 2015-06-20 NOTE — Progress Notes (Signed)
PULMONARY / CRITICAL CARE MEDICINE   Name: Christine [name] MRN: [id number] DOB: 08/07/1955    ADMISSION DATE:  06/18/2015 CONSULTATION DATE:  06/19/2015  REFERRING MD :  Roel Cluck [facility]  CHIEF COMPLAINT:  Shortness of breath  INITIAL PRESENTATION: 59 year old female with a past medical history significant for metastatic breast cancer was admitted on 06/19/2015 with shortness of breath. Found to have an enlarging right-sided pleural effusion. Pulmonary and critical care medicine was consulted for the pleural effusion.  STUDIES:  06/18/2015 CT angiogram chest >> small to moderate pleural effusion which is circumferential around the right lung and partially loculated between the upper and middle lobes, multiple pulmonary nodules, interlobular septal thickening noted, mild compressive atelectatic changes noted in the right lower lobe, right upper lobe groundglass opacification as well as left upper lobe opacification of uncertain etiology  SIGNIFICANT EVENTS: 11/14  Admit with desaturations (to 60's at home) & SOB    SUBJECTIVE: Pt denies acute complaints.  Reports feeling better than on admit.    VITAL SIGNS: Temp:  [97.5 F (36.4 C)-97.9 F (36.6 C)] 97.5 F (36.4 C) (11/16 0800) Pulse Rate:  [84-106] 89 (11/16 0700) Resp:  [17-35] 25 (11/16 0700) BP: (64-114)/(38-66) 114/50 mmHg (11/16 0700) SpO2:  [88 %-96 %] 94 % (11/16 0700) Weight:  [172 lb 6.4 oz (78.2 kg)] 172 lb 6.4 oz (78.2 kg) (11/16 0400)   HEMODYNAMICS:     VENTILATOR SETTINGS:     INTAKE / OUTPUT:  Intake/Output Summary (Last 24 hours) at 06/20/15 0925 Last data filed at 06/19/15 2139  Gross per 24 hour  Intake    775 ml  Output      0 ml  Net    775 ml    PHYSICAL EXAMINATION: General:  Chronically ill-appearing, no distress Neuro:  Awake alert, oriented 4, moves all 4 extremities HEENT:  Normocephalic/atraumatic, moon faces noted Cardiovascular:  Tachycardic, slight systolic  murmur, cannot assess JVD Lungs:  Crackles on R, diminished right lower lobe, diminished on L, normal respiratory effort Abdomen:  Bowel sounds positive, nontender nondistended Musculoskeletal:  Diminished bulk and tone Skin:  Diffuse anasarca with skin breakdown noted and sacral area  LABS:  CBC  Recent Labs Lab 06/18/15 2320 06/19/15 0322 06/20/15 0500  WBC 4.2 3.9* 4.6  HGB 8.7* 10.2* 9.4*  HCT 26.0* 30.5* 28.8*  PLT 56* 62* 67*   Coag's No results for input(s): APTT, INR in the last 168 hours.   BMET  Recent Labs Lab 06/18/15 2320 06/19/15 0322 06/20/15 0500  NA 137 136 137  K 2.9* 3.0* 4.6  CL 107 105 107  CO2 22 23 26   BUN 17 14 13   CREATININE 0.48 0.44 0.44  GLUCOSE 138* 132* 348*   Electrolytes  Recent Labs Lab 06/18/15 2320 06/19/15 0322 06/20/15 0500  CALCIUM 7.2* 7.7* 8.3*  MG  --  1.0* 2.3  PHOS  --  2.7  --    Sepsis Markers  Recent Labs Lab 06/18/15 2209 06/19/15 0038  LATICACIDVEN 1.54  --   PROCALCITON  --  0.59   ABG No results for input(s): PHART, PCO2ART, PO2ART in the last 168 hours.   Liver Enzymes  Recent Labs Lab 06/18/15 2138 06/18/15 2320 06/19/15 0322  AST 27 22 23   ALT 19 17 18   ALKPHOS 68 53 63  BILITOT 1.1 1.3* 1.0  ALBUMIN 2.5* 2.0* 2.1*   Cardiac Enzymes  Recent Labs Lab 06/19/15 0039 06/19/15 0322 06/19/15 1240  TROPONINI 0.06* 0.05* 0.04*  Glucose  Recent Labs Lab 06/19/15 0744 06/19/15 1201 06/19/15 1556 06/19/15 2025 06/20/15 0741  GLUCAP 173* 221* 274* 300* 319*    Imaging No results found.  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  In October, she was noted to have inflammatory changes (consolidation) and a pleural effusion which improved dramatically after steroid therapy. That may be the best first approach. The differential diagnosis here includes pneumonia and pulmonary edema but I think those are less likely considering her lack of swelling and fever or cough. P:   Steroid therapy with Decadron and repeat a chest x-ray >> pending If the pleural effusion increases in size then we need to discuss her case with Dr. Caffie Pinto who previously placed her Pleurx catheter and perform the talc pleurodesis D3/x abx, recommend d/c but defer to primary (see ID)  CARDIOVASCULAR CVL R IJ port A:  Sinus tachycardia with mild hypotension in the setting of diarrhea: Most likely representative of volume loss and anemia from GI losses - resolved.  P:  NS @ KVO Change solumedrol to hydrocortisone stress dose for mineralocorticoid effect  RENAL A:   Hypokalemia P:   Monitor BMET and UOP Replace electrolytes as needed   GASTROINTESTINAL A:   Diarrhea with some blood, known hemorrhoids, unclear if this represents a significant lower GI bleed but there has been a significant drop in her hemoglobin P:   Monitor closely C. difficile precautions Monitor for blood in stool  HEMATOLOGIC A:   Symptomatic anemia in setting of possible lower GI bleed P:  Monitor for bleeding Transfuse to keep hemoglobin greater than 7 g/dL  INFECTIOUS A:   Currently on treatment for healthcare associated pneumonia, however given her lack of cough or fever at this is less likely.  Do not feel she has sepsis, her vital signs are more consistent with anemia and hypovolemia from diarrhea. P:   Antibiotics per [facility], would consider stopping them  ENDOCRINE A:   Hyperglycemia P:   Insulin therapy per [facility] hospitalist  NEUROLOGIC A:   Known brain  metastasis P:   Per oncology  GLOBAL: Overall, our goals of care here should be palliative in nature.  Of note, her CODE STATUS was DO NOT RESUSCITATE during the last admission.  Palliative care consult pending.    FAMILY  - Updates: family updated at bedside 11/16 - husband and sister.      Noe Gens, NP-C [place] [facility] Pgr: [phone] or if no answer [phone] 06/20/2015, 9:25 AM

## 2015-06-20 NOTE — Consult Note (Deleted)
PULMONARY / CRITICAL CARE MEDICINE   Name: Christine [name] MRN: [id number] DOB: 1955/09/05    ADMISSION DATE:  06/18/2015 CONSULTATION DATE:  06/19/2015  REFERRING MD :  Roel Cluck [facility]  CHIEF COMPLAINT:  Shortness of breath  INITIAL PRESENTATION: 59 year old female with a past medical history significant for metastatic breast cancer was admitted on 06/19/2015 with shortness of breath. Found to have an enlarging right-sided pleural effusion. Pulmonary and critical care medicine was consulted for the pleural effusion.  STUDIES:  06/18/2015 CT angiogram chest >> small to moderate pleural effusion which is circumferential around the right lung and partially loculated between the upper and middle lobes, multiple pulmonary nodules, interlobular septal thickening noted, mild compressive atelectatic changes noted in the right lower lobe, right upper lobe groundglass opacification as well as left upper lobe opacification of uncertain etiology  SIGNIFICANT EVENTS: 11/14  Admit with desaturations (to 60's at home) & SOB    SUBJECTIVE: Pt denies acute complaints.  Reports feeling better than on admit.    VITAL SIGNS: Temp:  [97.5 F (36.4 C)-97.9 F (36.6 C)] 97.5 F (36.4 C) (11/16 0800) Pulse Rate:  [84-106] 89 (11/16 0700) Resp:  [17-35] 25 (11/16 0700) BP: (64-114)/(38-66) 114/50 mmHg (11/16 0700) SpO2:  [88 %-96 %] 94 % (11/16 0700) Weight:  [172 lb 6.4 oz (78.2 kg)] 172 lb 6.4 oz (78.2 kg) (11/16 0400)   HEMODYNAMICS:     VENTILATOR SETTINGS:     INTAKE / OUTPUT:  Intake/Output Summary (Last 24 hours) at 06/20/15 0846 Last data filed at 06/19/15 2139  Gross per 24 hour  Intake    850 ml  Output      0 ml  Net    850 ml    PHYSICAL EXAMINATION: General:  Chronically ill-appearing, no distress Neuro:  Awake alert, oriented 4, moves all 4 extremities HEENT:  Normocephalic/atraumatic, moon faces noted Cardiovascular:  Tachycardic, slight systolic  murmur, cannot assess JVD Lungs:  Crackles on R, diminished right lower lobe, diminished on L, normal respiratory effort Abdomen:  Bowel sounds positive, nontender nondistended Musculoskeletal:  Diminished bulk and tone Skin:  Diffuse anasarca with skin breakdown noted and sacral area  LABS:  CBC  Recent Labs Lab 06/18/15 2320 06/19/15 0322 06/20/15 0500  WBC 4.2 3.9* 4.6  HGB 8.7* 10.2* 9.4*  HCT 26.0* 30.5* 28.8*  PLT 56* 62* 67*   Coag's No results for input(s): APTT, INR in the last 168 hours.   BMET  Recent Labs Lab 06/18/15 2320 06/19/15 0322 06/20/15 0500  NA 137 136 137  K 2.9* 3.0* 4.6  CL 107 105 107  CO2 22 23 26   BUN 17 14 13   CREATININE 0.48 0.44 0.44  GLUCOSE 138* 132* 348*   Electrolytes  Recent Labs Lab 06/18/15 2320 06/19/15 0322 06/20/15 0500  CALCIUM 7.2* 7.7* 8.3*  MG  --  1.0* 2.3  PHOS  --  2.7  --    Sepsis Markers  Recent Labs Lab 06/18/15 2209 06/19/15 0038  LATICACIDVEN 1.54  --   PROCALCITON  --  0.59   ABG No results for input(s): PHART, PCO2ART, PO2ART in the last 168 hours.   Liver Enzymes  Recent Labs Lab 06/18/15 2138 06/18/15 2320 06/19/15 0322  AST 27 22 23   ALT 19 17 18   ALKPHOS 68 53 63  BILITOT 1.1 1.3* 1.0  ALBUMIN 2.5* 2.0* 2.1*   Cardiac Enzymes  Recent Labs Lab 06/19/15 0039 06/19/15 0322 06/19/15 1240  TROPONINI 0.06* 0.05* 0.04*  Glucose  Recent Labs Lab 06/19/15 0744 06/19/15 1201 06/19/15 1556 06/19/15 2025 06/20/15 0741  GLUCAP 173* 221* 274* 300* 319*    Imaging No results found.  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  In October, she was noted to have inflammatory changes (consolidation) and a pleural effusion which improved dramatically after steroid therapy. That may be the best first approach. The differential diagnosis here includes pneumonia and pulmonary edema but I think those are less likely considering her lack of swelling and fever or cough. P:   Steroid therapy with Decadron and repeat a chest x-ray >> pending If the pleural effusion increases in size then we need to discuss her case with Dr. Caffie Pinto who previously placed her Pleurx catheter and perform the talc pleurodesis D3/x abx, recommend d/c but defer to primary (see ID)  CARDIOVASCULAR CVL R IJ port A:  Sinus tachycardia with mild hypotension in the setting of diarrhea: Most likely representative of volume loss and anemia from GI losses - resolved.  P:  NS @ KVO Change solumedrol to hydrocortisone stress dose for mineralocorticoid effect  RENAL A:   Hypokalemia P:   Monitor BMET and UOP Replace electrolytes as needed   GASTROINTESTINAL A:   Diarrhea with some blood, known hemorrhoids, unclear if this represents a significant lower GI bleed but there has been a significant drop in her hemoglobin P:   Monitor closely C. difficile precautions Monitor for blood in stool  HEMATOLOGIC A:   Symptomatic anemia in setting of possible lower GI bleed P:  Monitor for bleeding Transfuse to keep hemoglobin greater than 7 g/dL  INFECTIOUS A:   Currently on treatment for healthcare associated pneumonia, however given her lack of cough or fever at this is less likely.  Do not feel she has sepsis, her vital signs are more consistent with anemia and hypovolemia from diarrhea. P:   Antibiotics per [facility], would consider stopping them  ENDOCRINE A:   Hyperglycemia P:   Insulin therapy per [facility] hospitalist  NEUROLOGIC A:   Known brain  metastasis P:   Per oncology  GLOBAL: Overall, our goals of care here should be palliative in nature.  Of note, her CODE STATUS was DO NOT RESUSCITATE during the last admission.  Palliative care consult pending.    FAMILY  - Updates: family updated at bedside 11/16 - husband and sister.      Noe Gens, NP-C [facility] Pgr: [phone] or if no answer [phone] 06/20/2015, 8:47 AM

## 2015-06-20 NOTE — Evaluation (Signed)
Physical Therapy Evaluation Patient Details Name: Christine [name] MRN: [id number] DOB: 1955-10-03 Today's Date: 06/20/2015   History of Present Illness  Christine [name] is an 59 y.o. female with a PMH of  left breast cancer, proven lung metastasis and brain mets, status post whole brain radiation, who was brought to the ED 11/14  for evaluation of mental status changes. In emergency department she was found to be tachycardic and hypotensive with blood pressures as low 74/53 patient was given 2 L. CT scan of the chest was done showing no evidence of PE but there is a reaccumulation of moderate right pleural effusion as well as some pulmonary edema and persistent multiple pulmonary nodules. Patient was found to be anemic . Palliative care consult pending  Clinical Impression  Pt admitted with above diagnosis. Pt currently with functional limitations due to the deficits listed below (see PT Problem List). Pt will benefit from skilled PT  For family instruction and DME needs  if plans for home with Hospice. Patient is very weak, on6-8 liters [place], RR 30's  Sats dipping into mid 80's. Very much compromised.    Follow Up Recommendations Supervision/Assistance - 24 hour (uncetrtain-may be home with Hospice)    Equipment Recommendations  Hospital bed    Recommendations for Other Services       Precautions / Restrictions Precautions Precautions: Fall Precaution Comments: on  high oxygen demand, watch BP and O2 and RR      Mobility  Bed Mobility Overal bed mobility: Needs Assistance;+2 for physical assistance;+ 2 for safety/equipment Bed Mobility: Rolling;Sidelying to Sit;Sit to Sidelying Rolling: +2 for safety/equipment;Total assist Sidelying to sit: +2 for safety/equipment;Total assist     Sit to sidelying: +2 for safety/equipment;Total assist General bed mobility comments: patient does initiate roll with UE.s, assist for f legs over edge and onto bed  Transfers                     Ambulation/Gait                Stairs            Wheelchair Mobility    Modified Rankin (Stroke Patients Only)       Balance Overall balance assessment: Needs assistance Sitting-balance support: Bilateral upper extremity supported;Feet supported Sitting balance-Leahy Scale: Poor                                       Pertinent Vitals/Pain Pain Assessment: Faces Faces Pain Scale: Hurts little more Pain Location: all over Pain Descriptors / Indicators: Aching Pain Intervention(s): Limited activity within patient's tolerance;Repositioned    Home Living Family/patient expects to be discharged to:: Private residence Living Arrangements: Spouse/significant other Available Help at Discharge: Family Type of Home: House Home Access: Stairs to enter   Technical brewer of Steps: 4 Home Layout: One level   Additional Comments: has a transport chair    Prior Function Level of Independence: Needs assistance               Hand Dominance        Extremity/Trunk Assessment   Upper Extremity Assessment: LUE deficits/detail;Defer to OT evaluation           Lower Extremity Assessment: RLE deficits/detail;LLE deficits/detail RLE Deficits / Details: grossly 2+ hip and knee and ankles LLE Deficits / Details: appears  to be weaker than r, ~ 2 /5  Cervical / Trunk Assessment: Other exceptions  Communication      Cognition Arousal/Alertness: Awake/alert Behavior During Therapy: WFL for tasks assessed/performed Overall Cognitive Status: Within Functional Limits for tasks assessed                      General Comments      Exercises General Exercises - Upper Extremity Shoulder Flexion: AROM;Both;10 reps Shoulder ABduction: AROM;Both;10 reps General Exercises - Lower Extremity Long Arc Quad: AROM;Both;5 reps Heel Slides: AAROM;Both;5 reps      Assessment/Plan    PT Assessment Patient needs continued PT services  PT  Diagnosis Difficulty walking;Generalized weakness;Acute pain   PT Problem List Decreased strength;Decreased activity tolerance;Decreased balance;Decreased mobility;Cardiopulmonary status limiting activity  PT Treatment Interventions Functional mobility training;Therapeutic activities   PT Goals (Current goals can be found in the Care Plan section) Acute Rehab PT Goals Patient Stated Goal: to move around PT Goal Formulation: With patient/family Time For Goal Achievement: 07/04/15 Potential to Achieve Goals: Fair    Frequency Min 3X/week   Barriers to discharge        Co-evaluation               End of Session   Activity Tolerance: Patient limited by fatigue Patient left: in bed;with call bell/phone within reach;with bed alarm set;with family/visitor present Nurse Communication: Mobility status;Need for lift equipment         Time: 1213-1235 PT Time Calculation (min) (ACUTE ONLY): 22 min   Charges:   PT Evaluation $Initial PT Evaluation Tier I: 1 Procedure     PT G CodesClaretha Cooper 06/20/2015, 1:32 PM Tresa Endo PT [phone]

## 2015-06-21 DIAGNOSIS — Z515 Encounter for palliative care: Secondary | ICD-10-CM | POA: Insufficient documentation

## 2015-06-21 DIAGNOSIS — J9601 Acute respiratory failure with hypoxia: Secondary | ICD-10-CM | POA: Insufficient documentation

## 2015-06-21 DIAGNOSIS — C50919 Malignant neoplasm of unspecified site of unspecified female breast: Secondary | ICD-10-CM | POA: Insufficient documentation

## 2015-06-21 DIAGNOSIS — C799 Secondary malignant neoplasm of unspecified site: Secondary | ICD-10-CM

## 2015-06-21 DIAGNOSIS — L8990 Pressure ulcer of unspecified site, unspecified stage: Secondary | ICD-10-CM

## 2015-06-21 LAB — CBC
HCT: 28.8 % — ABNORMAL LOW (ref 36.0–46.0)
Hemoglobin: 9.4 g/dL — ABNORMAL LOW (ref 12.0–15.0)
MCH: 32.8 pg (ref 26.0–34.0)
MCHC: 32.6 g/dL (ref 30.0–36.0)
MCV: 100.3 fL — ABNORMAL HIGH (ref 78.0–100.0)
PLATELETS: 68 10*3/uL — AB (ref 150–400)
RBC: 2.87 MIL/uL — ABNORMAL LOW (ref 3.87–5.11)
RDW: 16.4 % — AB (ref 11.5–15.5)
WBC: 4.7 10*3/uL (ref 4.0–10.5)

## 2015-06-21 LAB — BASIC METABOLIC PANEL
ANION GAP: 6 (ref 5–15)
BUN: 12 mg/dL (ref 6–20)
CALCIUM: 8.9 mg/dL (ref 8.9–10.3)
CO2: 27 mmol/L (ref 22–32)
CREATININE: 0.41 mg/dL — AB (ref 0.44–1.00)
Chloride: 107 mmol/L (ref 101–111)
GLUCOSE: 343 mg/dL — AB (ref 65–99)
Potassium: 4.3 mmol/L (ref 3.5–5.1)
Sodium: 140 mmol/L (ref 135–145)

## 2015-06-21 LAB — GLUCOSE, CAPILLARY
GLUCOSE-CAPILLARY: 352 mg/dL — AB (ref 65–99)
Glucose-Capillary: 329 mg/dL — ABNORMAL HIGH (ref 65–99)
Glucose-Capillary: 241 mg/dL — ABNORMAL HIGH (ref 65–99)
Glucose-Capillary: 218 mg/dL — ABNORMAL HIGH (ref 65–99)

## 2015-06-21 LAB — PROCALCITONIN: PROCALCITONIN: 0.21 ng/mL

## 2015-06-21 MED ORDER — POLYETHYLENE GLYCOL 3350 17 G PO PACK
17.0000 g | PACK | Freq: Every day | ORAL | Status: DC
  Administered 2015-06-21 – 2015-06-23 (×3): 17 g via ORAL
  Filled 2015-06-21 (×3): qty 1

## 2015-06-21 MED ORDER — SODIUM CHLORIDE 0.9 % IJ SOLN
10.0000 mL | INTRAMUSCULAR | Status: DC | PRN
  Administered 2015-06-21: 10 mL
  Filled 2015-06-21: qty 40

## 2015-06-21 NOTE — Progress Notes (Signed)
Palliative:  Christine [name] is sleeping when I come by and her husband has left not long ago according to nursing. I called Christine [name] and he confirms that they have decided to focus on keeping her comfortable at home for as long as she has and agree with hospice at this point - I agree with this decision for her. I explained the importance of considering other decisions they may be faced with at some point such as resuscitation - he would like to meet tomorrow morning 1100 am 06/22/15 to discuss these issues as a family.   No Charge  Vinie Sill, NP Palliative Medicine Team Pager # [phone] (M-F 8a-5p) Team Phone # [phone] (Nights/Weekends)

## 2015-06-21 NOTE — Progress Notes (Signed)
PULMONARY / CRITICAL CARE MEDICINE   Name: Christine [name] MRN: [id number] DOB: 01-31-1956    ADMISSION DATE:  06/18/2015 CONSULTATION DATE:  06/19/2015  REFERRING MD :  Roel Cluck [facility]  CHIEF COMPLAINT:  Shortness of breath  INITIAL PRESENTATION: 59 year old female with a past medical history significant for metastatic breast cancer was admitted on 06/19/2015 with shortness of breath. Found to have an enlarging right-sided pleural effusion. Pulmonary and critical care medicine was consulted for the pleural effusion.  STUDIES:  06/18/2015 CT angiogram chest >> small to moderate pleural effusion which is circumferential around the right lung and partially loculated between the upper and middle lobes, multiple pulmonary nodules, interlobular septal thickening noted, mild compressive atelectatic changes noted in the right lower lobe, right upper lobe groundglass opacification as well as left upper lobe opacification of uncertain etiology  SIGNIFICANT EVENTS: 11/14  Admit with desaturations (to 60's at home) & SOB  SUBJECTIVE: Resp status is stable. Transferred out of ICU.  VITAL SIGNS: Temp:  [97.3 F (36.3 C)-98.5 F (36.9 C)] 98 F (36.7 C) (11/17 1355) Pulse Rate:  [84-100] 84 (11/17 1355) Resp:  [20-24] 21 (11/17 1355) BP: (119-133)/(64-89) 124/77 mmHg (11/17 1355) SpO2:  [92 %-95 %] 94 % (11/17 1355)   HEMODYNAMICS:     VENTILATOR SETTINGS:     INTAKE / OUTPUT:  Intake/Output Summary (Last 24 hours) at 06/21/15 1414 Last data filed at 06/21/15 1147  Gross per 24 hour  Intake    540 ml  Output    250 ml  Net    290 ml    PHYSICAL EXAMINATION: General:  Cushingoid face, No distress Neuro:  AAO X # Cardiovascular:  RRR, No MRG Lungs:  Crackles rt lung base Abdomen:  Bowel sounds positive, nontender nondistended Musculoskeletal:  Diminished bulk and tone Skin:  Diffuse anasarca  LABS:  CBC  Recent Labs Lab 06/19/15 0322 06/20/15 0500  06/21/15 0517  WBC 3.9* 4.6 4.7  HGB 10.2* 9.4* 9.4*  HCT 30.5* 28.8* 28.8*  PLT 62* 67* 68*   Coag's No results for input(s): APTT, INR in the last 168 hours.   BMET  Recent Labs Lab 06/19/15 0322 06/20/15 0500 06/21/15 0517  NA 136 137 140  K 3.0* 4.6 4.3  CL 105 107 107  CO2 23 26 27   BUN 14 13 12   CREATININE 0.44 0.44 0.41*  GLUCOSE 132* 348* 343*   Electrolytes  Recent Labs Lab 06/19/15 0322 06/20/15 0500 06/21/15 0517  CALCIUM 7.7* 8.3* 8.9  MG 1.0* 2.3  --   PHOS 2.7  --   --    Sepsis Markers  Recent Labs Lab 06/18/15 2209 06/19/15 0038 06/21/15 0517  LATICACIDVEN 1.54  --   --   PROCALCITON  --  0.59 0.21   ABG No results for input(s): PHART, PCO2ART, PO2ART in the last 168 hours.   Liver Enzymes  Recent Labs Lab 06/18/15 2138 06/18/15 2320 06/19/15 0322  AST 27 22 23   ALT 19 17 18   ALKPHOS 68 53 63  BILITOT 1.1 1.3* 1.0  ALBUMIN 2.5* 2.0* 2.1*   Cardiac Enzymes  Recent Labs Lab 06/19/15 0039 06/19/15 0322 06/19/15 1240  TROPONINI 0.06* 0.05* 0.04*   Glucose  Recent Labs Lab 06/20/15 0741 06/20/15 1153 06/20/15 1659 06/20/15 2120 06/21/15 0745 06/21/15 1152  GLUCAP 319* 365* 290* 332* 329* 352*    Imaging No results found.  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  In October, she was noted to have inflammatory changes (consolidation) and a pleural effusion which improved dramatically after steroid therapy. That may be the best first approach. The differential diagnosis here  includes pneumonia and pulmonary edema but I think those are less likely considering her lack of swelling and fever or cough. P:   Steroid therapy with Decadron. If the pleural effusion increases in size then we need to discuss her case with Dr. Caffie Pinto who previously placed her Pleurx catheter and perform the talc pleurodesis D3/x abx, recommend d/c but defer to primary (see ID)  CARDIOVASCULAR CVL R IJ port A:  Sinus tachycardia with mild hypotension in the setting of diarrhea: Most likely representative of volume loss and anemia from GI losses - resolved.  P:  NS @ KVO Change solumedrol to hydrocortisone stress dose for mineralocorticoid effect  RENAL A:   Hypokalemia P:   Monitor BMET and UOP Replace electrolytes as needed   GASTROINTESTINAL A:   Diarrhea with some blood, known hemorrhoids, unclear if this represents a significant lower GI bleed but there has been a significant drop in her hemoglobin P:   Monitor closely C. difficile precautions Monitor for blood in stool  HEMATOLOGIC A:   Symptomatic anemia in setting of possible lower GI bleed P:  Monitor for bleeding Transfuse to keep hemoglobin greater than 7 g/dL  INFECTIOUS A:   Currently on treatment for healthcare associated pneumonia, however given her lack of cough or fever at this is less likely.  Do not feel she has sepsis, her vital signs are more consistent with anemia and hypovolemia from diarrhea. P:   Antibiotics per [facility], would consider stopping them  ENDOCRINE A:   Hyperglycemia P:   Insulin therapy per [facility] hospitalist  NEUROLOGIC A:   Known brain metastasis P:   Per oncology  GLOBAL:  Going to hospice. We will sign off. Please re consult if needed.  Marshell Garfinkel MD [facility] Pager [phone] If no answer or after 3pm call: [phone] 06/21/2015, 6:05 PM

## 2015-06-21 NOTE — Progress Notes (Addendum)
[facility]                                                                              Patient Demographics  Christine [name], is a 59 y.o. female, DOB - [birth year], [id number]  Admit date - 06/18/2015   Admitting Physician Toy Baker, MD  Outpatient Primary MD for the patient is Lucretia Kern., DO  LOS - 2   Chief Complaint  Patient presents with  . Diarrhea  . Anorexia      HPI on 06/18/2015 by Dr. 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 She status post chemotherapy and radiation therapy inducing thrombocytopenia and anemia. Patient has known history of recurrent pleural effusions and chronic hypoxia. Chemotherapy currently has been held secondary to patient's poor performance status. Her brain metastasis was treated with dexamethasone which has been discontinued she had an MRI of the brain done on 11 foot did show excellent response. She was left with chronic left leg weakness but did not elect to have any physical therapy. Patient has known history of hemorrhoids but usually there hasn't been any bleeding. Today patient developed worsening diarrhea and some blood in stool with worsening hemorrhoids. Her oxygen was checked at home and it was again showed to be low at which point her family put her on oxygen and brought her back to emergency department. Patient reports poor by mouth intake. Joya Gaskins to this patient apparently was off of oxygen for a few months. In emergency department she was found to be tachycardic and hypotensive with blood pressures as low 74/53 patient was given 2 L. CT scan of the chest was done showing no evidence of PE but there is a reaccumulation of moderate right pleural effusion as well as some pulmonary edema and persistent multiple pulmonary nodules. Patient was found to be anemic Hemoglobin down to 8.7 which is down from 12.3 on 10/ 21st.  But on 10/07 she was down to 8.6 as  well. Patient platelet count is down to 56 from 149 on October 21 which is also similar to prior results in the beginning of October.  Patient has history of diabetes was on insulin while on steroid but currently discontinued.  Patient reports for 2 days that the light has been bothering her eyes but denies any headache, no neck pain, no fever. She denies any nausea. Hospitalist was called for admission for hypotension in the setting of poor by mouth intake  Assessment & Plan   Acute history failure with hypoxia -Admitted to the hospital with hypoxia and required 6 L of oxygen to maintain saturations of 90% -Possibly secondary to right pleural effusion which is recurrent versus lung metastasis versus ?HCAP -CTA chest showed right-sided pleural effusion with probable congestion -Pulmonology consulted and appreciated  Recurrent right pleural effusion -Probably malignant pleural effusion -Patient s/p TALC pleurodesis -Continue steroids -Pulmonology consulted and appreciated, repeat chest x-ray pending  Sepsis -Patient was hypotensive, tachycardic and tachypneic upon admission with questionable pneumonia -Proalcitonin 0.59, patient was started on broad-spectrum antibiotics -Hypotension likely secondary to dehydration and diarrhea -Vital signs have stabilized -Given patient's lack of cough and fever at this time,  will discontinue antibiotics and continue to monitor closely -Strep pneumonia antigen negative, Legionella urine antigen pending  Breast cancer/Lung metastasis -Oncology consulted and appreciated  Essential hypertension -Home blood pressure medications held  Anemia of chronic disease -Upon admission, hemoglobin is 8.6 currently 9.4 -Anemia possibly secondary to chronic disease versus chemotherapy-induced  -Continue monitor CBC closely   Thrombocytopenia -Platelets improving, possibly secondary to chemotherapy -Continue to monitor CBC -Oncology consulted and  appreciated  Hypotension -Improved -Continue steroids, IV fluid -Possibly secondary to dehydration versus adrenal insufficiency  Diarrhea -Cdiff pending but patient has not had another BM since admission. -FOBT +, possibly due to hemorrhoids   Constipation -Will start miralax  Diabetes mellitus, type II -Last hemoglobin A1c was 6.3 -Metformin held -Continue insulin sliding scale CBG monitoring  Goals of care -Spoke with patient regarding hospice as this was mentioned in oncology note, patient stated she did not want to go home with hospice as she did not feel she was dying.   -Spoke with patient and family today regarding hospice, they were agreeable. -In the past, patient has had DO NOT RESUSCITATE CODE STATUS, patient did not want to discuss this at this time -Palliative care consulted and appreciated -PT consulted and recommended supervision  Stage II pressure ulcer, sacral -Continue wound care  Code Status: Full   Family Communication: Family at bedside   Disposition Plan: Admitted, likely discharge in 24 hours- to home with hospice  Time Spent in minutes   30 minutes  Procedures  None  Consults   Pulmonology Oncology Palliative care  DVT Prophylaxis  SCDs  Lab Results  Component Value Date   PLT 68* 06/21/2015    Medications  Scheduled Meds: . antiseptic oral rinse  7 mL Mouth Rinse BID  . dexamethasone  4 mg Intravenous 4 times per day  . hydrocortisone  1 application Rectal BID  . insulin aspart  0-15 Units Subcutaneous TID WC  . insulin aspart  0-5 Units Subcutaneous QHS  . insulin glargine  11 Units Subcutaneous Daily  . latanoprost  1 drop Both Eyes QHS  . levothyroxine  50 mcg Oral QAC breakfast  . loratadine  10 mg Oral Daily  . nystatin  5 mL Mouth/Throat QID  . sodium chloride  3 mL Intravenous Q12H  . timolol  1 drop Both Eyes BID   Continuous Infusions:  PRN Meds:.acetaminophen **OR** acetaminophen, HYDROcodone-acetaminophen,  ondansetron **OR** ondansetron (ZOFRAN) IV, phenol, sodium chloride  Antibiotics   Anti-infectives    Start     Dose/Rate Route Frequency Ordered Stop   06/19/15 0230  vancomycin (VANCOCIN) IVPB 1000 mg/200 mL premix  Status:  Discontinued     1,000 mg 200 mL/hr over 60 Minutes Intravenous 2 times daily 06/19/15 0223 06/21/15 1012   06/19/15 0230  cefTAZidime (FORTAZ) 2 g in dextrose 5 % 50 mL IVPB  Status:  Discontinued     2 g 100 mL/hr over 30 Minutes Intravenous 3 times per day 06/19/15 0223 06/21/15 1012      Subjective:   Christine [name] seen and examined today.  Patient states she is feeling better and ready to go home.  She is open to the idea of hospice.  She denies chest pain.  She feels her breathing has improved.  Denies abdominal pain, nausea, vomiting, diarrhea.    Objective:   Filed Vitals:   06/20/15 1400 06/20/15 1500 06/20/15 2116 06/21/15 0528  BP: 130/71 119/64 130/75 133/89  Pulse: 101 100 100 91  Temp:  98.5 F (  36.9 C) 98 F (36.7 C) 97.3 F (36.3 C)  TempSrc:  Oral Oral Oral  Resp: 27 24 20 22   Height:      Weight:      SpO2: 91% 93% 92% 95%    Wt Readings from Last 3 Encounters:  06/20/15 78.2 kg (172 lb 6.4 oz)  05/25/15 76.204 kg (168 lb)  05/16/15 76.658 kg (169 lb)     Intake/Output Summary (Last 24 hours) at 06/21/15 1211 Last data filed at 06/21/15 1147  Gross per 24 hour  Intake    590 ml  Output    250 ml  Net    340 ml    Exam  General: Well developed, chronically ill-appearing, NAD   HEENT: NCAT, mucous membranes moist.   Cardiovascular: S1 S2 auscultated, soft SEM, Regular rate and rhythm.  Respiratory: Right sided crackles, diminished breath sounds   Abdomen: Soft, nontender, nondistended, + bowel sounds  Extremities: warm dry without cyanosis clubbing or edema  Neuro: AAOx3, nonfocal  Psych: Normal affect and demeanor  Data Review   Micro Results Recent Results (from the past 240 hour(s))  MRSA PCR Screening      Status: None   Collection Time: 06/19/15  3:20 AM  Result Value Ref Range Status   MRSA by PCR NEGATIVE NEGATIVE Final    Comment:        The GeneXpert MRSA Assay (FDA approved for NASAL specimens only), is one component of a comprehensive MRSA colonization surveillance program. It is not intended to diagnose MRSA infection nor to guide or monitor treatment for MRSA infections.   Urine culture     Status: None   Collection Time: 06/19/15  5:13 AM  Result Value Ref Range Status   Specimen Description URINE, CLEAN CATCH  Final   Special Requests NONE  Final   Culture   Final    3,000 COLONIES/mL INSIGNIFICANT GROWTH Performed at [facility]    Report Status 06/20/2015 FINAL  Final    Radiology Reports Ct Angio Chest Pe W/cm &/or Wo Cm  06/18/2015  CLINICAL DATA:  Shortness of breath and hypoxia. Rule out pulmonary embolus. EXAM: CT ANGIOGRAPHY CHEST WITH CONTRAST TECHNIQUE: Multidetector CT imaging of the chest was performed using the standard protocol during bolus administration of intravenous contrast. Multiplanar CT image reconstructions and MIPs were obtained to evaluate the vascular anatomy. CONTRAST:  168mL OMNIPAQUE IOHEXOL 350 MG/ML SOLN COMPARISON:  Chest radiograph earlier this day.  PET-CT 04/24/2015. FINDINGS: There are no filling defects within the pulmonary arteries to suggest pulmonary embolus. There is dilatation of the main pulmonary artery measuring 3.7 cm that is unchanged from prior PET. The thoracic aorta is normal in caliber with mild atherosclerosis. No dissection or aneurysm. Heart is borderline in size. Right chest port remains in place, tip at the atrial caval junction. Increased size of right pleural effusion with dependent portion as well as fluid in the right major fissure. Minimal fluid in the right minor fissure. There is no left pleural effusion, and trace dependent pleural thickening is seen. There is no pericardial effusion. Ground-glass  opacities in an upper lobe an perihilar predominant distribution consistent with pulmonary edema. There are multiple pulmonary nodules. Largest nodal conglomerate in the right lower lobe measures 2.2 cm, previously 2.4 cm. Right middle lobe nodule measures 12 mm, unchanged. Additional smaller nodules throughout the right lung are again seen, some partially obscured by the pleural effusion and ground-glass opacities. No definite new pulmonary nodules. No endobronchial lesions.  No definite hilar or mediastinal adenopathy. Bilobed shaped fluid collection in the left chest wall soft tissues is unchanged in appearance from prior. There are surgical clips in the both axilla. No axillary adenopathy. Air-filled esophagus containing minimal intraluminal debris, small hiatal hernia. Small sclerotic density within T12 vertebral body is unchanged, not previously hypermetabolic on PET. No new sclerotic or lytic lesions. Review of the MIP images confirms the above findings. IMPRESSION: 1. No pulmonary embolus. There is enlargement of main pulmonary artery that is unchanged from prior exam and may reflect pulmonary arterial hypertension. 2. Re-accumulation of moderate right pleural effusion with fluid dependently and in the right major fissure. Diffuse ground-glass opacities in a pattern consistent with pulmonary edema. Question heart failure. Infectious etiology could have a similar appearance but is felt less likely given distribution. 3. Multiple pulmonary nodules in the right lung, minimally decreased versus stable in size from prior PET. Electronically Signed   By: Jeb Levering M.D.   On: 06/18/2015 23:25   Mr [name] [id number] Contrast  06/14/2015  CLINICAL DATA:  Metastatic breast cancer. Completion of whole-brain radiation. EXAM: MRI HEAD WITHOUT AND WITH CONTRAST TECHNIQUE: Multiplanar, multiecho pulse sequences of the brain and surrounding structures were obtained without and with intravenous contrast. CONTRAST:  18mL  MULTIHANCE GADOBENATE DIMEGLUMINE 529 MG/ML IV SOLN COMPARISON:  MRI head 05/09/2015 FINDINGS: Numerous metastatic deposits in the brain. Many of these show restricted diffusion felt to be due to tumor necrosis. This was present previously. Decreased edema around lesions in the right parietal lobe and left parietal lobe. Minimal edema seen on today's study. No shift of the midline structures. Ventricle size normal. Negative for intracranial hemorrhage. Right cerebellar lesion posteriorly has improved in size now measuring 9 x 5.6 mm. Left cerebellar lesion has improved now measuring 7 x 8 mm. Right occipital parietal lesion improved now measuring 5 mm. Left occipital parietal lesion has improved since the prior study. Left frontal necrotic lesion is unchanged measuring 6 mm. Numerous punctate small lesions are present throughout both cerebral hemispheres which are similar to improved. IMPRESSION: No new lesions identified. Extensive metastatic disease to the brain. Overall the lesions are smaller compared with the prior study with less edema around several lesions. No new lesions. Electronically Signed   By: Franchot Gallo M.D.   On: 06/14/2015 11:36   Dg Chest Port 1 View  06/20/2015  CLINICAL DATA:  Metastatic breast cancer, history of right pleural effusion. EXAM: PORTABLE CHEST 1 VIEW COMPARISON:  06/18/2015 FINDINGS: Cardiomegaly again noted. Right IJ Port-A-Cath with tip in right atrium again noted. Persistent loculated right pleural effusion and right pleural thickening. Persistent right basilar atelectasis or infiltrate. There is streaky interstitial prominence bilateral upper lobe suspicious for interstitial edema or pneumonitis. IMPRESSION: Right IJ Port-A-Cath with tip in right atrium again noted. Persistent loculated right pleural effusion and right pleural thickening. Persistent right basilar atelectasis or infiltrate. There is streaky interstitial prominence bilateral upper lobe suspicious for  interstitial edema or pneumonitis. Electronically Signed   By: Lahoma Crocker M.D.   On: 06/20/2015 10:05   Dg Chest Port 1 View  06/18/2015  CLINICAL DATA:  Initial evaluation for hypoxia, shortness of breath. EXAM: PORTABLE CHEST 1 VIEW COMPARISON:  Prior radiograph from 05/16/2015. FINDINGS: Right-sided Port-A-Cath in place. Moderate cardiomegaly is stable. Mediastinal silhouette is unchanged. A right pleural effusion is grossly stable relative to previous study. There is superimposed diffuse increased pulmonary vascular congestion with indistinctness of the interstitial markings, suggesting pulmonary edema. A small  left pleural effusion likely present as well. No definite focal infiltrate. No pneumothorax. No acute osseus abnormality. Surgical clips overlie the axilla bilaterally. IMPRESSION: 1. Cardiomegaly with findings suggestive of mild diffuse pulmonary edema. 2. Similar right pleural effusion. 3. Small left pleural effusion. Electronically Signed   By: Jeannine Boga M.D.   On: 06/18/2015 22:34    CBC  Recent Labs Lab 06/18/15 2226 06/18/15 2320 06/19/15 0322 06/20/15 0500 06/21/15 0517  WBC  --  4.2 3.9* 4.6 4.7  HGB 18.7* 8.7* 10.2* 9.4* 9.4*  HCT 55.0* 26.0* 30.5* 28.8* 28.8*  PLT  --  56* 62* 67* 68*  MCV  --  98.1 100.3* 99.7 100.3*  MCH  --  32.8 33.6 32.5 32.8  MCHC  --  33.5 33.4 32.6 32.6  RDW  --  16.2* 16.6* 16.4* 16.4*  LYMPHSABS  --  0.7  --   --   --   MONOABS  --  0.2  --   --   --   EOSABS  --  0.0  --   --   --   BASOSABS  --  0.0  --   --   --     Chemistries   Recent Labs Lab 06/18/15 2138 06/18/15 2226 06/18/15 2320 06/19/15 0322 06/20/15 0500 06/21/15 0517  NA 135 135 137 136 137 140  K 3.5 3.6 2.9* 3.0* 4.6 4.3  CL 102 99* 107 105 107 107  CO2 22  --  22 23 26 27   GLUCOSE 179* 175* 138* 132* 348* 343*  BUN 20 24* 17 14 13 12   CREATININE 0.71 0.60 0.48 0.44 0.44 0.41*  CALCIUM 8.3*  --  7.2* 7.7* 8.3* 8.9  MG  --   --   --  1.0* 2.3  --    AST 27  --  22 23  --   --   ALT 19  --  17 18  --   --   ALKPHOS 68  --  53 63  --   --   BILITOT 1.1  --  1.3* 1.0  --   --    ------------------------------------------------------------------------------------------------------------------ estimated creatinine clearance is 76.6 mL/min (by C-G formula based on Cr of 0.41). ------------------------------------------------------------------------------------------------------------------  Recent Labs  06/19/15 0322  HGBA1C 9.2*   ------------------------------------------------------------------------------------------------------------------ No results for input(s): CHOL, HDL, LDLCALC, TRIG, CHOLHDL, LDLDIRECT in the last 72 hours. ------------------------------------------------------------------------------------------------------------------  Recent Labs  06/19/15 0500  TSH 0.890   ------------------------------------------------------------------------------------------------------------------ No results for input(s): VITAMINB12, FOLATE, FERRITIN, TIBC, IRON, RETICCTPCT in the last 72 hours.  Coagulation profile No results for input(s): INR, PROTIME in the last 168 hours.  No results for input(s): DDIMER in the last 72 hours.  Cardiac Enzymes  Recent Labs Lab 06/19/15 0039 06/19/15 0322 06/19/15 1240  TROPONINI 0.06* 0.05* 0.04*   ------------------------------------------------------------------------------------------------------------------ Invalid input(s): POCBNP    Schuyler Behan D.O. on 06/21/2015 at 12:11 PM  Between 7am to 7pm - Pager - [phone]  After 7pm go to [email or web address] - password [email or web address]  And look for the night coverage person covering for me after hours  [facility] Office  [phone]

## 2015-06-21 NOTE — Progress Notes (Signed)
HEMATOLOGY-ONCOLOGY PROGRESS NOTE  SUBJECTIVE: Patient is sitting eating breakfast, reports her breathing is slightly better. She received 10 minutes of physical therapy yesterday and was thoroughly exhausted. She is unable to stand on her feet. Facial swelling related to steroid therapy.  OBJECTIVE: PHYSICAL EXAMINATION: ECOG PERFORMANCE STATUS: 4 - Bedbound  Filed Vitals:   06/21/15 1355  BP: 124/77  Pulse: 84  Temp: 98 F (36.7 C)  Resp: 21   Filed Weights   06/19/15 0200 06/20/15 0400  Weight: 167 lb 15.9 oz (76.2 kg) 172 lb 6.4 oz (78.2 kg)    GENERAL:alert SKIN: skin color, texture, turgor are normal, no rashes or significant lesions LUNGS: Bilateral decreased breath sounds with fine crackles HEART: regular rate & rhythm and no murmurs and no lower extremity edema ABDOMEN:abdomen soft, non-tender and normal bowel sounds Musculoskeletal:no cyanosis of digits and no clubbing  NEURO: alert & oriented x 3 with fluent speech, lower extremity strength 3/5  LABORATORY DATA:  I have reviewed the data as listed CMP Latest Ref Rng 06/21/2015 06/20/2015 06/19/2015  Glucose 65 - 99 mg/dL 343(H) 348(H) 132(H)  BUN 6 - 20 mg/dL 12 13 14   Creatinine 0.44 - 1.00 mg/dL 0.41(L) 0.44 0.44  Sodium 135 - 145 mmol/L 140 137 136  Potassium 3.5 - 5.1 mmol/L 4.3 4.6 3.0(L)  Chloride 101 - 111 mmol/L 107 107 105  CO2 22 - 32 mmol/L 27 26 23   Calcium 8.9 - 10.3 mg/dL 8.9 8.3(L) 7.7(L)  Total Protein 6.5 - 8.1 g/dL - - 5.0(L)  Total Bilirubin 0.3 - 1.2 mg/dL - - 1.0  Alkaline Phos 38 - 126 U/L - - 63  AST 15 - 41 U/L - - 23  ALT 14 - 54 U/L - - 18    Lab Results  Component Value Date   WBC 4.7 06/21/2015   HGB 9.4* 06/21/2015   HCT 28.8* 06/21/2015   MCV 100.3* 06/21/2015   PLT 68* 06/21/2015   NEUTROABS 3.3 06/18/2015    ASSESSMENT AND PLAN: 1. Metastatic breast cancer with extensive lung infiltrates and loculated pleural effusion: I discussed with the patient's husband and the  patient along with her sister that no amount of further chemotherapy is helpful for her. Her performance status is significantly impaired and hence I recommended hospice care. Her husband had many questions about how to best take care of her. I suggested that hospice would provide any additional services for the family to help take care of her.  I anticipate her life expectancy to be less than 6 months. Even though the patient does not yet ready to accept that she has such poor prognosis, I tried to discuss the fact that we should focus on comfort care measures however long she has for survival.  Family was in agreement as well as Mrs. Meigs I discontinued hydrocortisone. She could go home on oral dexamethasone 4 mg twice a day which may further be reduced to 4 mg once a day ultimately down to 2 mg once a day slowly over time. She admitted need to remain on a small dose of oral dexamethasone either 2 mg 1 mg on a chronic basis. This is mainly for comfort care and decrease in lung inflammation.

## 2015-06-21 NOTE — Progress Notes (Signed)
CM met with family to confirm discharge desire.  Pt sleeping and husband, Christine [name] [phone] states he understood "Doctor has already chosen a Hospice Agency and they are waiting to talk to a representative."  Cm called Dr. Mikhail who states no agency has been chosen and pt will most likely be discharged tomorrow (bowel mgmt issues).  CM spoke with Palliative Care NP who states she will follow later this afternoon with family; CM let NP know I will call [facility] to begin the process.   CM called referral to Margie of [facility].  CM met with Christine to let family  know Margie will be calling Christine to set up a meeting and [name] will be back to talk with family.  No other CM needs were communicated. 

## 2015-06-21 NOTE — Progress Notes (Signed)
OT Cancellation Note  Patient Details Name: Christine [name] MRN: [id number] DOB: [birth year]   Cancelled Treatment:    Reason Eval/Treat Not Completed: Other (comment) Spoke with RN regarding DC plan.  Rn states home with hospice. No family available at this time. Will follow up with pt next day to decide if OT indicated.  Kari Baars, [place]  Christine [name] D 06/21/2015, 2:24 PM

## 2015-06-22 ENCOUNTER — Telehealth: Payer: Self-pay | Admitting: *Deleted

## 2015-06-22 ENCOUNTER — Inpatient Hospital Stay (HOSPITAL_COMMUNITY): Payer: BLUE CROSS/BLUE SHIELD

## 2015-06-22 DIAGNOSIS — K5900 Constipation, unspecified: Secondary | ICD-10-CM | POA: Insufficient documentation

## 2015-06-22 DIAGNOSIS — Z66 Do not resuscitate: Secondary | ICD-10-CM

## 2015-06-22 LAB — GLUCOSE, CAPILLARY
GLUCOSE-CAPILLARY: 221 mg/dL — AB (ref 65–99)
GLUCOSE-CAPILLARY: 188 mg/dL — AB (ref 65–99)
Glucose-Capillary: 226 mg/dL — ABNORMAL HIGH (ref 65–99)
Glucose-Capillary: 202 mg/dL — ABNORMAL HIGH (ref 65–99)

## 2015-06-22 MED ORDER — SENNOSIDES-DOCUSATE SODIUM 8.6-50 MG PO TABS
1.0000 | ORAL_TABLET | Freq: Every day | ORAL | Status: DC
  Administered 2015-06-22: 1 via ORAL
  Filled 2015-06-22: qty 1

## 2015-06-22 MED ORDER — LACTULOSE 10 GM/15ML PO SOLN
10.0000 g | Freq: Once | ORAL | Status: AC
  Administered 2015-06-22: 10 g via ORAL
  Filled 2015-06-22: qty 15

## 2015-06-22 NOTE — Progress Notes (Signed)
Spoke with Nunzio Cory, RN [phone]) with [facility] concerning pt's discharge in AM. Pt needs to have BM before discharge. Pt will be followed at home with [facility].

## 2015-06-22 NOTE — Progress Notes (Signed)
Spoke with Nunzio Cory, [place], with [facility] concerning DME. DME will be delivered tomorrow 06/23/15 at 6PM to pt's home/Kathleen, RN with [facility].

## 2015-06-22 NOTE — Progress Notes (Signed)
Hospice RN, Claudette Stapler, came to speak with patient and patient's family. Copies of H&P, facesheet and DNR form given to hospice RN. Hospice nurse stated that supplies will be delivered to patient's home at [facility] and will be ready for patient when she is discharged home tomorrow. Will continue to monitor patient.

## 2015-06-22 NOTE — Progress Notes (Signed)
Daily Progress Note   Patient Name: Christine [name]       Date: 06/22/2015 DOB: 1955/10/01  Age: 59 y.o. MRN#: [id number] Attending Physician: Cristal Ford, DO Primary Care Physician: Lucretia Kern., DO Admit Date: 06/18/2015  Reason for Consultation/Follow-up: Establishing goals of care  Subjective: I met today with Ms. Mccleary, Mr. Proctor and her 2 sisters. Ms. Milham is sleeping some but mostly listening to our conversation - she chooses not to participate but does acknowledge it is ok for [place] to discuss these subjects. We discuss going home with hospice and hospice philosophy. We also discuss code status and family says that she has told them she would not want to "be on machines" and they agree to DNR. She listens and I tell her to interrupt if the conversation makes her uncomfortable or if she has questions or something to add - she says okay. They are awaiting to meet with hospice after our meeting and will plan to take her home tomorrow - when they can get equipment needed at their home. All questions/concerns addressed. Emotional support provided. Hard Choices booklet given to family.    Length of Stay: 3 days  Current Medications: Scheduled Meds:  . antiseptic oral rinse  7 mL Mouth Rinse BID  . dexamethasone  4 mg Intravenous 4 times per day  . hydrocortisone  1 application Rectal BID  . insulin aspart  0-15 Units Subcutaneous TID WC  . insulin aspart  0-5 Units Subcutaneous QHS  . insulin glargine  11 Units Subcutaneous Daily  . latanoprost  1 drop Both Eyes QHS  . levothyroxine  50 mcg Oral QAC breakfast  . loratadine  10 mg Oral Daily  . nystatin  5 mL Mouth/Throat QID  . polyethylene glycol  17 g Oral Daily  . senna-docusate  1-2 tablet Oral QHS  . sodium chloride  3 mL  Intravenous Q12H  . timolol  1 drop Both Eyes BID    Continuous Infusions:    PRN Meds: acetaminophen **OR** acetaminophen, HYDROcodone-acetaminophen, ondansetron **OR** ondansetron (ZOFRAN) IV, phenol, sodium chloride  Physical Exam: Physical Exam  Constitutional: She is oriented to person, place, and time. She appears well-developed. She appears lethargic.  HENT:  Head: Normocephalic.  Moon facies.   Cardiovascular: Normal rate.   Pulmonary/Chest: Effort normal. No accessory  muscle usage. No tachypnea. No respiratory distress.  Abdominal: Normal appearance.  Neurological: She is oriented to person, place, and time. She appears lethargic.                Vital Signs: BP 119/80 mmHg  Pulse 99  Temp(Src) 97.7 F (36.5 C) (Oral)  Resp 24  Ht $R'5\' 4"'Tg$  (1.626 m)  Wt 78.2 kg (172 lb 6.4 oz)  BMI 29.58 kg/m2  SpO2 93% SpO2: SpO2: 93 % O2 Device: O2 Device: Nasal Cannula O2 Flow Rate: O2 Flow Rate (L/min): 2 L/min  Intake/output summary:  Intake/Output Summary (Last 24 hours) at 06/22/15 1145 Last data filed at 06/22/15 0244  Gross per 24 hour  Intake    600 ml  Output    350 ml  Net    250 ml   LBM: Last BM Date: 06/20/15 Baseline Weight: Weight: 76.2 kg (167 lb 15.9 oz) Most recent weight: Weight: 78.2 kg (172 lb 6.4 oz)       Palliative Assessment/Data: Flowsheet Rows        Most Recent Value   Intake Tab    Referral Department  Oncology   Unit at Time of Referral  ICU   Palliative Care Primary Diagnosis  Cancer   Date Notified  06/19/15   Palliative Care Type  Return patient Palliative Care   Reason for referral  Clarify Goals of Care, Counsel Regarding Hospice   Date of Admission  06/18/15   # of days IP prior to Palliative referral  1   Clinical Assessment    Psychosocial & Spiritual Assessment    Palliative Care Outcomes       Additional Data Reviewed: CBC    Component Value Date/Time   WBC 4.7 06/21/2015 0517   WBC 11.8* 05/25/2015 1203   RBC 2.87*  06/21/2015 0517   RBC 3.84 05/25/2015 1203   RBC 3.18* 02/11/2015 2340   HGB 9.4* 06/21/2015 0517   HGB 12.3 05/25/2015 1203   HCT 28.8* 06/21/2015 0517   HCT 37.9 05/25/2015 1203   PLT 68* 06/21/2015 0517   PLT 149 05/25/2015 1203   MCV 100.3* 06/21/2015 0517   MCV 98.6 05/25/2015 1203   MCH 32.8 06/21/2015 0517   MCH 32.0 05/25/2015 1203   MCHC 32.6 06/21/2015 0517   MCHC 32.4 05/25/2015 1203   RDW 16.4* 06/21/2015 0517   RDW 18.1* 05/25/2015 1203   LYMPHSABS 0.7 06/18/2015 2320   LYMPHSABS 0.5* 05/25/2015 1203   MONOABS 0.2 06/18/2015 2320   MONOABS 0.8 05/25/2015 1203   EOSABS 0.0 06/18/2015 2320   EOSABS 0.0 05/25/2015 1203   BASOSABS 0.0 06/18/2015 2320   BASOSABS 0.0 05/25/2015 1203    CMP     Component Value Date/Time   NA 140 06/21/2015 0517   NA 135* 05/25/2015 1203   K 4.3 06/21/2015 0517   K 4.2 05/25/2015 1203   CL 107 06/21/2015 0517   CL 101 01/25/2013 0952   CO2 27 06/21/2015 0517   CO2 21* 05/25/2015 1203   GLUCOSE 343* 06/21/2015 0517   GLUCOSE 195* 05/25/2015 1203   GLUCOSE 169* 01/25/2013 0952   BUN 12 06/21/2015 0517   BUN 29.0* 05/25/2015 1203   CREATININE 0.41* 06/21/2015 0517   CREATININE 0.8 05/25/2015 1203   CALCIUM 8.9 06/21/2015 0517   CALCIUM 9.3 05/25/2015 1203   PROT 5.0* 06/19/2015 0322   PROT 5.7* 05/25/2015 1203   ALBUMIN 2.1* 06/19/2015 0322   ALBUMIN 3.2* 05/25/2015 1203   AST 23  06/19/2015 0322   AST 12 05/25/2015 1203   ALT 18 06/19/2015 0322   ALT 22 05/25/2015 1203   ALKPHOS 63 06/19/2015 0322   ALKPHOS 75 05/25/2015 1203   BILITOT 1.0 06/19/2015 0322   BILITOT 0.60 05/25/2015 1203   GFRNONAA >60 06/21/2015 0517   GFRAA >60 06/21/2015 0517       Problem List:  Patient Active Problem List   Diagnosis Date Noted  . Palliative care encounter   . Metastatic breast cancer ([place])   . Acute respiratory failure with hypoxemia ([place])   . Acute respiratory failure with hypoxia ([place]) 06/19/2015  . Blood in stool  06/19/2015  . Hypotension 06/19/2015  . Pressure ulcer 06/19/2015  . SIRS (systemic inflammatory response syndrome) ([facility])   . Altered mental status   . Acute encephalopathy 05/10/2015  . Anemia of chronic disease 05/10/2015  . Thrombocytopenia ([place]) 05/10/2015  . Acute on chronic respiratory failure with hypoxia ([place]) 05/10/2015  . Recurrent pleural effusion on right 05/10/2015  . Diabetes mellitus, type II ([place]) 05/10/2015  . Encounter for palliative care   . Expressive aphasia 05/09/2015  . Brain metastases ([place]) 04/03/2015  . Tachycardia 02/11/2015  . Lung metastasis ([place]) 10/30/2014  . Hypokalemia 10/30/2014  . Breast cancer of upper-outer quadrant of left female breast ([place]) 10/05/2012  . Hypothyroidism 02/16/2007  . Essential hypertension 02/16/2007     Palliative Care Assessment & Plan    1.Code Status:  DNR    Code Status Orders        Start     Ordered   06/22/15 1130  Do not attempt resuscitation (DNR)   Continuous    Question Answer Comment  In the event of cardiac or respiratory ARREST Do not call a "code blue"   In the event of cardiac or respiratory ARREST Do not perform Intubation, CPR, defibrillation or ACLS   In the event of cardiac or respiratory ARREST Use medication by any route, position, wound care, and other measures to relive pain and suffering. May use oxygen, suction and manual treatment of airway obstruction as needed for comfort.      06/22/15 1129       2. Goals of Care/Additional Recommendations:  Goal is to get her home and keep her happy and comfortable for extent of life. 2  Limitations on Scope of Treatment: Avoid Hospitalization  Desire for further Chaplaincy support:no  Psycho-social Needs: Caregiving  Support/Resources  3. Symptom Management:      1. Constipation: Lactulose was given earlier. Will order Senokot-S 1-2 tablets qhs.   4. Palliative Prophylaxis:   Bowel Regimen and Frequent Pain Assessment  5. Prognosis:  < 6 months  6. Discharge Planning:  Home with Home Health   Thank you for allowing the Palliative Medicine Team to assist in the care of this patient.   Time In: 1100 Time Out: 1140 Total Time 59min Prolonged Time Billed  no         Pershing Proud, NP  06/02/1313, 11:45 AM  Please contact Palliative Medicine Team phone at [phone] for questions and concerns.

## 2015-06-22 NOTE — Progress Notes (Signed)
[facility]                                                                              Patient Demographics  Christine [name], is a 59 y.o. female, DOB - Mar 28, 1956, [id number]  Admit date - 06/18/2015   Admitting Physician Toy Baker, MD  Outpatient Primary MD for the patient is Lucretia Kern., DO  LOS - 3   Chief Complaint  Patient presents with  . Diarrhea  . Anorexia      HPI on 06/18/2015 by Dr. 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 She status post chemotherapy and radiation therapy inducing thrombocytopenia and anemia. Patient has known history of recurrent pleural effusions and chronic hypoxia. Chemotherapy currently has been held secondary to patient's poor performance status. Her brain metastasis was treated with dexamethasone which has been discontinued she had an MRI of the brain done on 11 foot did show excellent response. She was left with chronic left leg weakness but did not elect to have any physical therapy. Patient has known history of hemorrhoids but usually there hasn't been any bleeding. Today patient developed worsening diarrhea and some blood in stool with worsening hemorrhoids. Her oxygen was checked at home and it was again showed to be low at which point her family put her on oxygen and brought her back to emergency department. Patient reports poor by mouth intake. Joya Gaskins to this patient apparently was off of oxygen for a few months. In emergency department she was found to be tachycardic and hypotensive with blood pressures as low 74/53 patient was given 2 L. CT scan of the chest was done showing no evidence of PE but there is a reaccumulation of moderate right pleural effusion as well as some pulmonary edema and persistent multiple pulmonary nodules. Patient was found to be anemic Hemoglobin down to 8.7 which is down from 12.3 on 10/ 21st.  But on 10/07 she was down to 8.6 as  well. Patient platelet count is down to 56 from 149 on October 21 which is also similar to prior results in the beginning of October.  Patient has history of diabetes was on insulin while on steroid but currently discontinued.  Patient reports for 2 days that the light has been bothering her eyes but denies any headache, no neck pain, no fever. She denies any nausea. Hospitalist was called for admission for hypotension in the setting of poor by mouth intake  Assessment & Plan   Acute history failure with hypoxia -Admitted to the hospital with hypoxia and required 6 L of oxygen to maintain saturations of 90% -Possibly secondary to right pleural effusion which is recurrent versus lung metastasis versus ?HCAP -CTA chest showed right-sided pleural effusion with probable congestion -Pulmonology consulted and appreciated  Recurrent right pleural effusion -Probably malignant pleural effusion -Patient s/p TALC pleurodesis -Continue steroids -Pulmonology consulted and appreciated, repeat chest x-ray showed persistent loculated right pleural effusion and right pleural thickening  Sepsis -Patient was hypotensive, tachycardic and tachypneic upon admission with questionable pneumonia -Proalcitonin 0.59, patient was started on broad-spectrum antibiotics -Hypotension likely secondary to dehydration and diarrhea -Vital signs have stabilized -Given  patient's lack of cough and fever at this time, will discontinue antibiotics and continue to monitor closely -Strep pneumonia antigen negative, Legionella urine antigen pending  Breast cancer/Lung metastasis -Oncology consulted and appreciated  Essential hypertension -Home blood pressure medications held  Anemia of chronic disease -Upon admission, hemoglobin is 8.6 currently 9.4 -Anemia possibly secondary to chronic disease versus chemotherapy-induced  -Continue monitor CBC closely   Thrombocytopenia -Platelets improving, possibly secondary to  chemotherapy -Continue to monitor CBC -Oncology consulted and appreciated  Hypotension -Improved -Continue steroids, IV fluid -Possibly secondary to dehydration versus adrenal insufficiency  Diarrhea -Cdiff pending but patient has not had another BM since admission. -FOBT +, possibly due to hemorrhoids   Constipation -Continue MiraLAX -Obtained abdominal series showing mild-to-moderate stool retention -Ordered lactulose  Diabetes mellitus, type II -Last hemoglobin A1c was 6.3 -Metformin held -Continue insulin sliding scale CBG monitoring  Goals of care -Palliative care consulted and appreciated -Patient's CODE STATUS has been changed to DO NOT RESUSCITATE. Plan is to discharge patient home with hospice. -PT consulted and recommended supervision  Stage II pressure ulcer, sacral -Continue wound care  Code Status: DO NOT RESUSCITATE  Family Communication: Family at bedside   Disposition Plan: Admitted, likely discharge in 24 hours- to home with hospice  Time Spent in minutes   30 minutes  Procedures  Echocardiogram  Consults   Pulmonology Oncology Palliative care  DVT Prophylaxis  SCDs  Lab Results  Component Value Date   PLT 68* 06/21/2015    Medications  Scheduled Meds: . antiseptic oral rinse  7 mL Mouth Rinse BID  . dexamethasone  4 mg Intravenous 4 times per day  . hydrocortisone  1 application Rectal BID  . insulin aspart  0-15 Units Subcutaneous TID WC  . insulin aspart  0-5 Units Subcutaneous QHS  . insulin glargine  11 Units Subcutaneous Daily  . latanoprost  1 drop Both Eyes QHS  . levothyroxine  50 mcg Oral QAC breakfast  . loratadine  10 mg Oral Daily  . nystatin  5 mL Mouth/Throat QID  . polyethylene glycol  17 g Oral Daily  . senna-docusate  1-2 tablet Oral QHS  . sodium chloride  3 mL Intravenous Q12H  . timolol  1 drop Both Eyes BID   Continuous Infusions:  PRN Meds:.acetaminophen **OR** acetaminophen, HYDROcodone-acetaminophen,  ondansetron **OR** ondansetron (ZOFRAN) IV, phenol, sodium chloride  Antibiotics   Anti-infectives    Start     Dose/Rate Route Frequency Ordered Stop   06/19/15 0230  vancomycin (VANCOCIN) IVPB 1000 mg/200 mL premix  Status:  Discontinued     1,000 mg 200 mL/hr over 60 Minutes Intravenous 2 times daily 06/19/15 0223 06/21/15 1012   06/19/15 0230  cefTAZidime (FORTAZ) 2 g in dextrose 5 % 50 mL IVPB  Status:  Discontinued     2 g 100 mL/hr over 30 Minutes Intravenous 3 times per day 06/19/15 0223 06/21/15 1012      Subjective:   Tammi Mcanany seen and examined today.  Patient states she is wants to go home. Denies any chest pain, short of breath, abdominal pain. Patient did not open her eyes during examination or questioning. Per husband at bedside, patient still has not had a bowel movement. He is concerned about taking her home.  Objective:   Filed Vitals:   06/22/15 0834 06/22/15 0836 06/22/15 0839 06/22/15 0841  BP:      Pulse:      Temp:      TempSrc:  Resp:      Height:      Weight:      SpO2: 95% 94% 86% 93%    Wt Readings from Last 3 Encounters:  06/20/15 78.2 kg (172 lb 6.4 oz)  05/25/15 76.204 kg (168 lb)  05/16/15 76.658 kg (169 lb)     Intake/Output Summary (Last 24 hours) at 06/22/15 1256 Last data filed at 06/22/15 0244  Gross per 24 hour  Intake    240 ml  Output    100 ml  Net    140 ml    Exam  General: Well developed, chronically ill-appearing, NAD   HEENT: NCAT, mucous membranes moist.   Cardiovascular: S1 S2 auscultated, soft SEM, Regular rate and rhythm.  Respiratory: Right sided crackles, diminished breath sounds   Abdomen: Soft, nontender, nondistended, + bowel sounds  Extremities: warm dry without cyanosis clubbing or edema  Neuro: AAOx3, nonfocal, no change  Data Review   Micro Results Recent Results (from the past 240 hour(s))  MRSA PCR Screening     Status: None   Collection Time: 06/19/15  3:20 AM  Result Value Ref  Range Status   MRSA by PCR NEGATIVE NEGATIVE Final    Comment:        The GeneXpert MRSA Assay (FDA approved for NASAL specimens only), is one component of a comprehensive MRSA colonization surveillance program. It is not intended to diagnose MRSA infection nor to guide or monitor treatment for MRSA infections.   Urine culture     Status: None   Collection Time: 06/19/15  5:13 AM  Result Value Ref Range Status   Specimen Description URINE, CLEAN CATCH  Final   Special Requests NONE  Final   Culture   Final    3,000 COLONIES/mL INSIGNIFICANT GROWTH Performed at [facility]    Report Status 06/20/2015 FINAL  Final    Radiology Reports Dg Abd 1 View  06/22/2015  CLINICAL DATA:  59 year old female with abdominal pain and distension. Constipation. Initial encounter. Current history of left breast cancer. EXAM: ABDOMEN - 1 VIEW COMPARISON:  PET-CT 04/24/2015 and earlier. FINDINGS: Portable AP supine view at 0939 hours. Mild respiratory motion. Normal bowel gas pattern. Gas in nondistended colon to the rectum. Mild to moderate volume of retained stool primarily at the splenic flexure. Abdominal and pelvic visceral contours are stable. Left hemipelvis phleboliths. Stable visualized osseous structures. Mild scoliosis. No definite pneumoperitoneum on this supine view. IMPRESSION: Normal bowel gas pattern. Mild to moderate volume of retained stool. Electronically Signed   By: Genevie Ann M.D.   On: 06/22/2015 09:55   Ct Angio Chest Pe W/cm &/or Wo Cm  06/18/2015  CLINICAL DATA:  Shortness of breath and hypoxia. Rule out pulmonary embolus. EXAM: CT ANGIOGRAPHY CHEST WITH CONTRAST TECHNIQUE: Multidetector CT imaging of the chest was performed using the standard protocol during bolus administration of intravenous contrast. Multiplanar CT image reconstructions and MIPs were obtained to evaluate the vascular anatomy. CONTRAST:  124mL OMNIPAQUE IOHEXOL 350 MG/ML SOLN COMPARISON:  Chest radiograph  earlier this day.  PET-CT 04/24/2015. FINDINGS: There are no filling defects within the pulmonary arteries to suggest pulmonary embolus. There is dilatation of the main pulmonary artery measuring 3.7 cm that is unchanged from prior PET. The thoracic aorta is normal in caliber with mild atherosclerosis. No dissection or aneurysm. Heart is borderline in size. Right chest port remains in place, tip at the atrial caval junction. Increased size of right pleural effusion with dependent portion as well  as fluid in the right major fissure. Minimal fluid in the right minor fissure. There is no left pleural effusion, and trace dependent pleural thickening is seen. There is no pericardial effusion. Ground-glass opacities in an upper lobe an perihilar predominant distribution consistent with pulmonary edema. There are multiple pulmonary nodules. Largest nodal conglomerate in the right lower lobe measures 2.2 cm, previously 2.4 cm. Right middle lobe nodule measures 12 mm, unchanged. Additional smaller nodules throughout the right lung are again seen, some partially obscured by the pleural effusion and ground-glass opacities. No definite new pulmonary nodules. No endobronchial lesions. No definite hilar or mediastinal adenopathy. Bilobed shaped fluid collection in the left chest wall soft tissues is unchanged in appearance from prior. There are surgical clips in the both axilla. No axillary adenopathy. Air-filled esophagus containing minimal intraluminal debris, small hiatal hernia. Small sclerotic density within T12 vertebral body is unchanged, not previously hypermetabolic on PET. No new sclerotic or lytic lesions. Review of the MIP images confirms the above findings. IMPRESSION: 1. No pulmonary embolus. There is enlargement of main pulmonary artery that is unchanged from prior exam and may reflect pulmonary arterial hypertension. 2. Re-accumulation of moderate right pleural effusion with fluid dependently and in the right  major fissure. Diffuse ground-glass opacities in a pattern consistent with pulmonary edema. Question heart failure. Infectious etiology could have a similar appearance but is felt less likely given distribution. 3. Multiple pulmonary nodules in the right lung, minimally decreased versus stable in size from prior PET. Electronically Signed   By: Jeb Levering M.D.   On: 06/18/2015 23:25   Mr [name] [id number] Contrast  06/14/2015  CLINICAL DATA:  Metastatic breast cancer. Completion of whole-brain radiation. EXAM: MRI HEAD WITHOUT AND WITH CONTRAST TECHNIQUE: Multiplanar, multiecho pulse sequences of the brain and surrounding structures were obtained without and with intravenous contrast. CONTRAST:  64mL MULTIHANCE GADOBENATE DIMEGLUMINE 529 MG/ML IV SOLN COMPARISON:  MRI head 05/09/2015 FINDINGS: Numerous metastatic deposits in the brain. Many of these show restricted diffusion felt to be due to tumor necrosis. This was present previously. Decreased edema around lesions in the right parietal lobe and left parietal lobe. Minimal edema seen on today's study. No shift of the midline structures. Ventricle size normal. Negative for intracranial hemorrhage. Right cerebellar lesion posteriorly has improved in size now measuring 9 x 5.6 mm. Left cerebellar lesion has improved now measuring 7 x 8 mm. Right occipital parietal lesion improved now measuring 5 mm. Left occipital parietal lesion has improved since the prior study. Left frontal necrotic lesion is unchanged measuring 6 mm. Numerous punctate small lesions are present throughout both cerebral hemispheres which are similar to improved. IMPRESSION: No new lesions identified. Extensive metastatic disease to the brain. Overall the lesions are smaller compared with the prior study with less edema around several lesions. No new lesions. Electronically Signed   By: Franchot Gallo M.D.   On: 06/14/2015 11:36   Dg Chest Port 1 View  06/20/2015  CLINICAL DATA:  Metastatic  breast cancer, history of right pleural effusion. EXAM: PORTABLE CHEST 1 VIEW COMPARISON:  06/18/2015 FINDINGS: Cardiomegaly again noted. Right IJ Port-A-Cath with tip in right atrium again noted. Persistent loculated right pleural effusion and right pleural thickening. Persistent right basilar atelectasis or infiltrate. There is streaky interstitial prominence bilateral upper lobe suspicious for interstitial edema or pneumonitis. IMPRESSION: Right IJ Port-A-Cath with tip in right atrium again noted. Persistent loculated right pleural effusion and right pleural thickening. Persistent right basilar atelectasis or infiltrate. There  is streaky interstitial prominence bilateral upper lobe suspicious for interstitial edema or pneumonitis. Electronically Signed   By: Lahoma Crocker M.D.   On: 06/20/2015 10:05   Dg Chest Port 1 View  06/18/2015  CLINICAL DATA:  Initial evaluation for hypoxia, shortness of breath. EXAM: PORTABLE CHEST 1 VIEW COMPARISON:  Prior radiograph from 05/16/2015. FINDINGS: Right-sided Port-A-Cath in place. Moderate cardiomegaly is stable. Mediastinal silhouette is unchanged. A right pleural effusion is grossly stable relative to previous study. There is superimposed diffuse increased pulmonary vascular congestion with indistinctness of the interstitial markings, suggesting pulmonary edema. A small left pleural effusion likely present as well. No definite focal infiltrate. No pneumothorax. No acute osseus abnormality. Surgical clips overlie the axilla bilaterally. IMPRESSION: 1. Cardiomegaly with findings suggestive of mild diffuse pulmonary edema. 2. Similar right pleural effusion. 3. Small left pleural effusion. Electronically Signed   By: Jeannine Boga M.D.   On: 06/18/2015 22:34    CBC  Recent Labs Lab 06/18/15 2226 06/18/15 2320 06/19/15 0322 06/20/15 0500 06/21/15 0517  WBC  --  4.2 3.9* 4.6 4.7  HGB 18.7* 8.7* 10.2* 9.4* 9.4*  HCT 55.0* 26.0* 30.5* 28.8* 28.8*  PLT  --   56* 62* 67* 68*  MCV  --  98.1 100.3* 99.7 100.3*  MCH  --  32.8 33.6 32.5 32.8  MCHC  --  33.5 33.4 32.6 32.6  RDW  --  16.2* 16.6* 16.4* 16.4*  LYMPHSABS  --  0.7  --   --   --   MONOABS  --  0.2  --   --   --   EOSABS  --  0.0  --   --   --   BASOSABS  --  0.0  --   --   --     Chemistries   Recent Labs Lab 06/18/15 2138 06/18/15 2226 06/18/15 2320 06/19/15 0322 06/20/15 0500 06/21/15 0517  NA 135 135 137 136 137 140  K 3.5 3.6 2.9* 3.0* 4.6 4.3  CL 102 99* 107 105 107 107  CO2 22  --  22 23 26 27   GLUCOSE 179* 175* 138* 132* 348* 343*  BUN 20 24* 17 14 13 12   CREATININE 0.71 0.60 0.48 0.44 0.44 0.41*  CALCIUM 8.3*  --  7.2* 7.7* 8.3* 8.9  MG  --   --   --  1.0* 2.3  --   AST 27  --  22 23  --   --   ALT 19  --  17 18  --   --   ALKPHOS 68  --  53 63  --   --   BILITOT 1.1  --  1.3* 1.0  --   --    ------------------------------------------------------------------------------------------------------------------ estimated creatinine clearance is 76.6 mL/min (by C-G formula based on Cr of 0.41). ------------------------------------------------------------------------------------------------------------------ No results for input(s): HGBA1C in the last 72 hours. ------------------------------------------------------------------------------------------------------------------ No results for input(s): CHOL, HDL, LDLCALC, TRIG, CHOLHDL, LDLDIRECT in the last 72 hours. ------------------------------------------------------------------------------------------------------------------ No results for input(s): TSH, T4TOTAL, T3FREE, THYROIDAB in the last 72 hours.  Invalid input(s): FREET3 ------------------------------------------------------------------------------------------------------------------ No results for input(s): VITAMINB12, FOLATE, FERRITIN, TIBC, IRON, RETICCTPCT in the last 72 hours.  Coagulation profile No results for input(s): INR, PROTIME in the last 168  hours.  No results for input(s): DDIMER in the last 72 hours.  Cardiac Enzymes  Recent Labs Lab 06/19/15 0039 06/19/15 0322 06/19/15 1240  TROPONINI 0.06* 0.05* 0.04*   ------------------------------------------------------------------------------------------------------------------ Invalid input(s): POCBNP    Waqas Bruhl  D.O. on 06/22/2015 at 12:56 PM  Between 7am to 7pm - Pager - [phone]  After 7pm go to [email or web address] - password [email or web address]  And look for the night coverage person covering for me after hours  [facility] Office  [phone]

## 2015-06-22 NOTE — Telephone Encounter (Signed)
TC from [place], [place] with [facility]. They have received referral for home hospice care for this patient.  Will Dr. Lindi Adie be attending for this patient and is he okay with their Hospice MD doing symptom management?  Pt is currently In-patient at [facility]   Please call Juanna Cao, [place] @ [phone]

## 2015-06-22 NOTE — Progress Notes (Signed)
OT Cancellation Note  Patient Details Name: Christine [name] MRN: [id number] DOB: [birth year]   Cancelled Treatment:    Reason Eval/Treat Not Completed: Other (comment) -- Per chart review, patient to go home with hospice with goal of keeping her comfortable. OT not warranted at this time. Agree with PT recommendation of hospital bed. OT will sign off.  Jaicob Dia, Suszanne Conners A 06/22/2015, 12:31 PM

## 2015-06-23 DIAGNOSIS — Z66 Do not resuscitate: Secondary | ICD-10-CM

## 2015-06-23 LAB — GLUCOSE, CAPILLARY: GLUCOSE-CAPILLARY: 191 mg/dL — AB (ref 65–99)

## 2015-06-23 LAB — PROCALCITONIN: Procalcitonin: <0.1 ng/mL

## 2015-06-23 MED ORDER — SENNOSIDES-DOCUSATE SODIUM 8.6-50 MG PO TABS: 1.0000 | ORAL_TABLET | Freq: Every day | ORAL | Status: AC

## 2015-06-23 MED ORDER — HEPARIN SOD (PORK) LOCK FLUSH 100 UNIT/ML IV SOLN
500.0000 [IU] | INTRAVENOUS | Status: AC | PRN
  Administered 2015-06-23: 500 [IU]

## 2015-06-23 MED ORDER — ALTEPLASE 2 MG IJ SOLR
2.0000 mg | Freq: Once | INTRAMUSCULAR | Status: AC
  Administered 2015-06-23: 2 mg
  Filled 2015-06-23: qty 2

## 2015-06-23 MED ORDER — POLYETHYLENE GLYCOL 3350 17 G PO PACK: 17.0000 g | PACK | Freq: Every day | ORAL | Status: AC

## 2015-06-23 MED ORDER — BISACODYL 10 MG RE SUPP
10.0000 mg | Freq: Once | RECTAL | Status: AC
  Administered 2015-06-23: 10 mg via RECTAL
  Filled 2015-06-23: qty 1

## 2015-06-23 NOTE — Progress Notes (Signed)
Patient discharged to home with hospice. Ivs and tele removed, AVS printed and went over with patient and family. Patient transported home by [place]. Patient stable at time of discharge.

## 2015-06-23 NOTE — Discharge Instructions (Signed)
Respiratory failure is when your lungs are not working well and your breathing (respiratory) system fails. When respiratory failure occurs, it is difficult for your lungs to get enough oxygen, get rid of carbon dioxide, or both. Respiratory failure can be life threatening.  °Respiratory failure can be acute or chronic. Acute respiratory failure is sudden, severe, and requires emergency medical treatment. Chronic respiratory failure is less severe, happens over time, and requires ongoing treatment.  °WHAT ARE THE CAUSES OF ACUTE RESPIRATORY FAILURE?  °Any problem affecting the heart or lungs can cause acute respiratory failure. Some of these causes include the following: °· Chronic bronchitis and emphysema (COPD).   °· Blood clot going to a lung (pulmonary embolism).   °· Having water in the lungs caused by heart failure, lung injury, or infection (pulmonary edema).   °· Collapsed lung (pneumothorax).   °· Pneumonia.   °· Pulmonary fibrosis.   °· Obesity.   °· Asthma.   °· Heart failure.   °· Any type of trauma to the chest that can make breathing difficult.   °· Nerve or muscle diseases making chest movements difficult. °HOW WILL MY ACUTE RESPIRATORY FAILURE BE TREATED?  °Treatment of acute respiratory failure depends on the cause of the respiratory failure. Usually, you will stay in the intensive care unit so your breathing can be watched closely. Treatment can include the following: °· Oxygen. Oxygen can be delivered through the following: °¨ Nasal cannula. This is small tubing that goes in your nose to give you oxygen. °¨ Face mask. A face mask covers your nose and mouth to give you oxygen. °· Medicine. Different medicines can be given to help with breathing. These can include: °¨ Nebulizers. Nebulizers deliver medicines to open the air passages (bronchodilators). These medicines help to open or relax the airways in the lungs so you can breathe better. They can also help loosen mucus from your  lungs. °¨ Diuretics. Diuretic medicines can help you breathe better by getting rid of extra water in your body. °¨ Steroids. Steroid medicines can help decrease swelling (inflammation) in your lungs. °¨ Antibiotics. °· Chest tube. If you have a collapsed lung (pneumothorax), a chest tube is placed to help reinflate the lung. °· Noninvasive positive pressure ventilation (NPPV). This is a tight-fitting mask that goes over your nose and mouth. The mask has tubing that is attached to a machine. The machine blows air into the tubing, which helps to keep the tiny air sacs (alveoli) in your lungs open. This machine allows you to breathe on your own. °· Ventilator. A ventilator is a breathing machine. When on a ventilator, a breathing tube is put into the lungs. A ventilator is used when you can no longer breathe well enough on your own. You may have low oxygen levels or high carbon dioxide (CO2) levels in your blood. When you are on a ventilator, sedation and pain medicines are given to make you sleep so your lungs can heal. °SEEK IMMEDIATE MEDICAL CARE IF: °· You have shortness of breath (dyspnea) with or without activity. °· You have rapid breathing (tachypnea). °· You are wheezing. °· You are unable to say more than a few words without having to catch your breath. °· You find it very difficult to function normally. °· You have a fast heart rate. °· You have a bluish color to your finger or toe nail beds. °· You have confusion or drowsiness or both. °  °This information is not intended to replace advice given to you by your health care provider. Make sure you discuss   any questions you have with your health care provider. °  °Document Released: 07/26/2013 Document Revised: 04/11/2015 Document Reviewed: 07/26/2013 °Elsevier Interactive Patient Education ©2016 [employer] ° °

## 2015-06-23 NOTE — Discharge Summary (Signed)
Physician Discharge Summary  Christine [name] TKP:[id number] DOB: [birth year] DOA: 06/18/2015  PCP: Lucretia Kern., DO  Admit date: 06/18/2015 Discharge date: 06/23/2015  Time spent: 45 minutes  Recommendations for Outpatient Follow-up:  Patient will be discharged to home with hospice.  Patient will need to follow up with primary care provider within one week of discharge, repeat CBC and BMP.  Patient should continue medications as prescribed.  Patient should follow a carb modified diet.   Discharge Diagnoses:  Acute respiratory failure with hypoxia Recurrent right pleural effusion Sepsis Breast cancer/lung metastasis Essential hypertension Anemia of chronic disease Thrombocytopenia Hypotension Diarrhea Constipation Diabetes mellitus, type II Goals of care Stage II pressure ulcer, sacral  Discharge Condition: Stable  Diet recommendation: Carb modified  Filed Weights   06/19/15 0200 06/20/15 0400  Weight: 76.2 kg (167 lb 15.9 oz) 78.2 kg (172 lb 6.4 oz)    History of present illness:  on 06/18/2015 by Dr. 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 She status post chemotherapy and radiation therapy inducing thrombocytopenia and anemia. Patient has known history of recurrent pleural effusions and chronic hypoxia. Chemotherapy currently has been held secondary to patient's poor performance status. Her brain metastasis was treated with dexamethasone which has been discontinued she had an MRI of the brain done on 11 foot did show excellent response. She was left with chronic left leg weakness but did not elect to have any physical therapy. Patient has known history of hemorrhoids but usually there hasn't been any bleeding. Today patient developed worsening diarrhea and some blood in stool with worsening hemorrhoids. Her oxygen was checked at home and it was again showed to be low at which point her family put her on  oxygen and brought her back to emergency department. Patient reports poor by mouth intake. Joya Gaskins to this patient apparently was off of oxygen for a few months. In emergency department she was found to be tachycardic and hypotensive with blood pressures as low 74/53 patient was given 2 L. CT scan of the chest was done showing no evidence of PE but there is a reaccumulation of moderate right pleural effusion as well as some pulmonary edema and persistent multiple pulmonary nodules. Patient was found to be anemic Hemoglobin down to 8.7 which is down from 12.3 on 10/ 21st. But on 10/07 she was down to 8.6 as well. Patient platelet count is down to 56 from 149 on October 21 which is also similar to prior results in the beginning of October.  Patient has history of diabetes was on insulin while on steroid but currently discontinued.  Patient reports for 2 days that the light has been bothering her eyes but denies any headache, no neck pain, no fever. She denies any nausea. Hospitalist was called for admission for hypotension in the setting of poor by mouth intake  Hospital Course:  Acute respiratory failure with hypoxia -Admitted to the hospital with hypoxia and required 6 L of oxygen to maintain saturations of 90% -Will discharge patient with home oxygen -Possibly secondary to right pleural effusion which is recurrent versus lung metastasis versus ?HCAP -CTA chest showed right-sided pleural effusion with probable congestion -Pulmonology consulted and appreciated  Recurrent right pleural effusion -Probably malignant pleural effusion -Patient s/p TALC pleurodesis -Continue steroids -Pulmonology consulted and appreciated, repeat chest x-ray showed persistent loculated right pleural effusion and right pleural thickening  Sepsis -Patient was hypotensive, tachycardic and tachypneic upon admission with questionable pneumonia -  Proalcitonin 0.59, patient was started on broad-spectrum  antibiotics -Hypotension likely secondary to dehydration and diarrhea -Vital signs have stabilized -Given patient's lack of cough and fever at this time, discontinued antibiotics and continue to monitor closely -Strep pneumonia antigen negative, Legionella urine antigen pending  Breast cancer/Lung metastasis -Oncology consulted and appreciated  Essential hypertension -Home blood pressure medications held  Anemia of chronic disease -Upon admission, hemoglobin is 8.6 currently 9.4 -Anemia possibly secondary to chronic disease versus chemotherapy-induced   Thrombocytopenia -Platelets improving, possibly secondary to chemotherapy -Oncology consulted and appreciated  Hypotension -Resolved -Continue steroids -Possibly secondary to dehydration versus adrenal insufficiency  Diarrhea -Cdiff pending but patient has not had another BM since admission. -FOBT +, possibly due to hemorrhoids   Constipation -Resolved,  -Obtained abdominal series showing mild-to-moderate stool retention -patient given miralax, sennokot, lactulose with no improvement -Given Dulcolax suppository, which was successful.  Diabetes mellitus, type II -Last hemoglobin A1c was 6.3 -Metformin held, may continue at discharge  Goals of care -Palliative care consulted and appreciated -Patient's CODE STATUS has been changed to DO NOT RESUSCITATE. Plan is to discharge patient home with hospice. -PT consulted and recommended supervision  Stage II pressure ulcer, sacral -Continue wound care  Code Status: DO NOT RESUSCITATE  Procedures  Echocardiogram  Consults  Pulmonology Oncology Palliative care  Discharge Exam: Filed Vitals:   06/23/15 0549  BP: 125/84  Pulse: 89  Temp: 97.9 F (36.6 C)  Resp: 18   Exam  General: Well developed, chronically ill-appearing, NAD   HEENT: NCAT, mucous membranes moist.   Cardiovascular: S1 S2 auscultated, soft SEM, RRR  Respiratory: Right sided crackles,  diminished breath sounds  Abdomen: Soft, nontender, nondistended, + bowel sounds  Extremities: warm dry without cyanosis clubbing or edema  Neuro: AAOx3, nonfocal, no change  Psych: Appropriate mood and affect, pleasant.  Discharge Instructions      Discharge Instructions    Discharge instructions    Complete by:  As directed   Patient will be discharged to home with hospice.  Patient will need to follow up with primary care provider within one week of discharge, repeat CBC and BMP.  Patient should continue medications as prescribed.  Patient should follow a carb modified diet.            Medication List    STOP taking these medications        dexamethasone 2 MG tablet  Commonly known as:  DECADRON     dexamethasone 4 MG tablet  Commonly known as:  DECADRON     insulin aspart 100 UNIT/ML injection  Commonly known as:  novoLOG     insulin glargine 100 UNIT/ML injection  Commonly known as:  LANTUS     KLOR-CON M20 20 MEQ tablet  Generic drug:  potassium chloride SA     LORazepam 0.5 MG tablet  Commonly known as:  ATIVAN      TAKE these medications        cetirizine 10 MG tablet  Commonly known as:  ZYRTEC  Take 10 mg by mouth daily as needed for allergies.     cholecalciferol 1000 UNITS tablet  Commonly known as:  VITAMIN D  Take 1,000 Units by mouth every morning.     diltiazem 120 MG 24 hr capsule  Commonly known as:  CARDIZEM CD  TAKE 1 CAPSULE (120 MG TOTAL) BY MOUTH EVERY MORNING.     fluticasone 50 MCG/ACT nasal spray  Commonly known as:  FLONASE  Place 2 sprays into  both nostrils daily as needed for allergies or rhinitis.     glucose blood test strip  Commonly known as:  ONETOUCH VERIO  Use as instructed to check blood sugar three times a day     hydrocortisone 2.5 % rectal cream  Commonly known as:  ANUSOL-HC  Place 1 application rectally 2 (two) times daily.     HYDROmorphone 2 MG tablet  Commonly known as:  DILAUDID  Take 0.5 tablets (1  mg total) by mouth every 6 (six) hours as needed for severe pain.     latanoprost 0.005 % ophthalmic solution  Commonly known as:  XALATAN  Place 1 drop into both eyes at bedtime.     levothyroxine 50 MCG tablet  Commonly known as:  SYNTHROID, LEVOTHROID  Take 1 tablet (50 mcg total) by mouth daily.     lidocaine-prilocaine cream  Commonly known as:  EMLA  Apply to affected area once     metFORMIN 500 MG tablet  Commonly known as:  GLUCOPHAGE  Take $Rem'1000mg'PaGw$  (2 tabs) in the morning and $RemoveBef'500mg'sVChHDkDxo$  (1 tab) in the evening with meals     nystatin 100000 UNIT/ML suspension  Commonly known as:  MYCOSTATIN  TAKE 5 MLS (500,000 UNITS TOTAL) BY MOUTH 2 (TWO) TIMES DAILY.     ondansetron 8 MG tablet  Commonly known as:  ZOFRAN  Take 1 tablet (8 mg total) by mouth every 8 (eight) hours as needed for nausea or vomiting.     ONETOUCH VERIO W/DEVICE Kit  by Does not apply route. Per pharmacist at [facility]     polyethylene glycol packet  Commonly known as:  MIRALAX / GLYCOLAX  Take 17 g by mouth daily.     prochlorperazine 10 MG tablet  Commonly known as:  COMPAZINE  Take 1 tablet (10 mg total) by mouth every 6 (six) hours as needed (Nausea or vomiting).     senna-docusate 8.6-50 MG tablet  Commonly known as:  Senokot-S  Take 1-2 tablets by mouth at bedtime.     timolol 0.5 % ophthalmic solution  Commonly known as:  TIMOPTIC  Place 1 drop into both eyes 2 (two) times daily.     VISINE OP  Apply 1-2 drops to eye daily as needed (itchy eyes.).       Allergies  Allergen Reactions  . Sulfa Antibiotics Hives, Itching and Swelling  . Sulfonamide Derivatives Hives, Itching and Swelling  . Tomato Hives and Itching   Follow-up Information    Follow up with Colin Benton R., DO. Schedule an appointment as soon as possible for a visit in 1 week.   Specialty:  Family Medicine   Why:  Hospital follow-up   Contact information:   [place] [address] [phone]         The results of significant diagnostics from this hospitalization (including imaging, microbiology, ancillary and laboratory) are listed below for reference.    Significant Diagnostic Studies: Dg Abd 1 View  06/22/2015  CLINICAL DATA:  59 year old female with abdominal pain and distension. Constipation. Initial encounter. Current history of left breast cancer. EXAM: ABDOMEN - 1 VIEW COMPARISON:  PET-CT 04/24/2015 and earlier. FINDINGS: Portable AP supine view at 0939 hours. Mild respiratory motion. Normal bowel gas pattern. Gas in nondistended colon to the rectum. Mild to moderate volume of retained stool primarily at the splenic flexure. Abdominal and pelvic visceral contours are stable. Left hemipelvis phleboliths. Stable visualized osseous structures. Mild scoliosis. No definite pneumoperitoneum on this supine view.  IMPRESSION: Normal bowel gas pattern. Mild to moderate volume of retained stool. Electronically Signed   By: Genevie Ann M.D.   On: 06/22/2015 09:55   Ct Angio Chest Pe W/cm &/or Wo Cm  06/18/2015  CLINICAL DATA:  Shortness of breath and hypoxia. Rule out pulmonary embolus. EXAM: CT ANGIOGRAPHY CHEST WITH CONTRAST TECHNIQUE: Multidetector CT imaging of the chest was performed using the standard protocol during bolus administration of intravenous contrast. Multiplanar CT image reconstructions and MIPs were obtained to evaluate the vascular anatomy. CONTRAST:  178mL OMNIPAQUE IOHEXOL 350 MG/ML SOLN COMPARISON:  Chest radiograph earlier this day.  PET-CT 04/24/2015. FINDINGS: There are no filling defects within the pulmonary arteries to suggest pulmonary embolus. There is dilatation of the main pulmonary artery measuring 3.7 cm that is unchanged from prior PET. The thoracic aorta is normal in caliber with mild atherosclerosis. No dissection or aneurysm. Heart is borderline in size. Right chest port remains in place, tip at the atrial caval junction. Increased size of right pleural effusion  with dependent portion as well as fluid in the right major fissure. Minimal fluid in the right minor fissure. There is no left pleural effusion, and trace dependent pleural thickening is seen. There is no pericardial effusion. Ground-glass opacities in an upper lobe an perihilar predominant distribution consistent with pulmonary edema. There are multiple pulmonary nodules. Largest nodal conglomerate in the right lower lobe measures 2.2 cm, previously 2.4 cm. Right middle lobe nodule measures 12 mm, unchanged. Additional smaller nodules throughout the right lung are again seen, some partially obscured by the pleural effusion and ground-glass opacities. No definite new pulmonary nodules. No endobronchial lesions. No definite hilar or mediastinal adenopathy. Bilobed shaped fluid collection in the left chest wall soft tissues is unchanged in appearance from prior. There are surgical clips in the both axilla. No axillary adenopathy. Air-filled esophagus containing minimal intraluminal debris, small hiatal hernia. Small sclerotic density within T12 vertebral body is unchanged, not previously hypermetabolic on PET. No new sclerotic or lytic lesions. Review of the MIP images confirms the above findings. IMPRESSION: 1. No pulmonary embolus. There is enlargement of main pulmonary artery that is unchanged from prior exam and may reflect pulmonary arterial hypertension. 2. Re-accumulation of moderate right pleural effusion with fluid dependently and in the right major fissure. Diffuse ground-glass opacities in a pattern consistent with pulmonary edema. Question heart failure. Infectious etiology could have a similar appearance but is felt less likely given distribution. 3. Multiple pulmonary nodules in the right lung, minimally decreased versus stable in size from prior PET. Electronically Signed   By: Jeb Levering M.D.   On: 06/18/2015 23:25   Mr [name] IP Contrast  06/14/2015  CLINICAL DATA:  Metastatic breast  cancer. Completion of whole-brain radiation. EXAM: MRI HEAD WITHOUT AND WITH CONTRAST TECHNIQUE: Multiplanar, multiecho pulse sequences of the brain and surrounding structures were obtained without and with intravenous contrast. CONTRAST:  34mL MULTIHANCE GADOBENATE DIMEGLUMINE 529 MG/ML IV SOLN COMPARISON:  MRI head 05/09/2015 FINDINGS: Numerous metastatic deposits in the brain. Many of these show restricted diffusion felt to be due to tumor necrosis. This was present previously. Decreased edema around lesions in the right parietal lobe and left parietal lobe. Minimal edema seen on today's study. No shift of the midline structures. Ventricle size normal. Negative for intracranial hemorrhage. Right cerebellar lesion posteriorly has improved in size now measuring 9 x 5.6 mm. Left cerebellar lesion has improved now measuring 7 x 8 mm. Right occipital parietal lesion improved now  measuring 5 mm. Left occipital parietal lesion has improved since the prior study. Left frontal necrotic lesion is unchanged measuring 6 mm. Numerous punctate small lesions are present throughout both cerebral hemispheres which are similar to improved. IMPRESSION: No new lesions identified. Extensive metastatic disease to the brain. Overall the lesions are smaller compared with the prior study with less edema around several lesions. No new lesions. Electronically Signed   By: Franchot Gallo M.D.   On: 06/14/2015 11:36   Dg Chest Port 1 View  06/20/2015  CLINICAL DATA:  Metastatic breast cancer, history of right pleural effusion. EXAM: PORTABLE CHEST 1 VIEW COMPARISON:  06/18/2015 FINDINGS: Cardiomegaly again noted. Right IJ Port-A-Cath with tip in right atrium again noted. Persistent loculated right pleural effusion and right pleural thickening. Persistent right basilar atelectasis or infiltrate. There is streaky interstitial prominence bilateral upper lobe suspicious for interstitial edema or pneumonitis. IMPRESSION: Right IJ Port-A-Cath  with tip in right atrium again noted. Persistent loculated right pleural effusion and right pleural thickening. Persistent right basilar atelectasis or infiltrate. There is streaky interstitial prominence bilateral upper lobe suspicious for interstitial edema or pneumonitis. Electronically Signed   By: Lahoma Crocker M.D.   On: 06/20/2015 10:05   Dg Chest Port 1 View  06/18/2015  CLINICAL DATA:  Initial evaluation for hypoxia, shortness of breath. EXAM: PORTABLE CHEST 1 VIEW COMPARISON:  Prior radiograph from 05/16/2015. FINDINGS: Right-sided Port-A-Cath in place. Moderate cardiomegaly is stable. Mediastinal silhouette is unchanged. A right pleural effusion is grossly stable relative to previous study. There is superimposed diffuse increased pulmonary vascular congestion with indistinctness of the interstitial markings, suggesting pulmonary edema. A small left pleural effusion likely present as well. No definite focal infiltrate. No pneumothorax. No acute osseus abnormality. Surgical clips overlie the axilla bilaterally. IMPRESSION: 1. Cardiomegaly with findings suggestive of mild diffuse pulmonary edema. 2. Similar right pleural effusion. 3. Small left pleural effusion. Electronically Signed   By: Jeannine Boga M.D.   On: 06/18/2015 22:34    Microbiology: Recent Results (from the past 240 hour(s))  MRSA PCR Screening     Status: None   Collection Time: 06/19/15  3:20 AM  Result Value Ref Range Status   MRSA by PCR NEGATIVE NEGATIVE Final    Comment:        The GeneXpert MRSA Assay (FDA approved for NASAL specimens only), is one component of a comprehensive MRSA colonization surveillance program. It is not intended to diagnose MRSA infection nor to guide or monitor treatment for MRSA infections.   Urine culture     Status: None   Collection Time: 06/19/15  5:13 AM  Result Value Ref Range Status   Specimen Description URINE, CLEAN CATCH  Final   Special Requests NONE  Final   Culture    Final    3,000 COLONIES/mL INSIGNIFICANT GROWTH Performed at [facility]    Report Status 06/20/2015 FINAL  Final     Labs: Basic Metabolic Panel:  Recent Labs Lab 06/18/15 2138 06/18/15 2226 06/18/15 2320 06/19/15 0322 06/20/15 0500 06/21/15 0517  NA 135 135 137 136 137 140  K 3.5 3.6 2.9* 3.0* 4.6 4.3  CL 102 99* 107 105 107 107  CO2 22  --  $R'22 23 26 27  'oK$ GLUCOSE 179* 175* 138* 132* 348* 343*  BUN 20 24* $Remo'17 14 13 12  'UxHyx$ CREATININE 0.71 0.60 0.48 0.44 0.44 0.41*  CALCIUM 8.3*  --  7.2* 7.7* 8.3* 8.9  MG  --   --   --  1.0* 2.3  --   PHOS  --   --   --  2.7  --   --    Liver Function Tests:  Recent Labs Lab 06/18/15 2138 06/18/15 2320 06/19/15 0322  AST $Re'27 22 23  'KyX$ ALT $R'19 17 18  'wQ$ ALKPHOS 68 53 63  BILITOT 1.1 1.3* 1.0  PROT 5.6* 4.4* 5.0*  ALBUMIN 2.5* 2.0* 2.1*    Recent Labs Lab 06/18/15 2138 06/18/15 2320  LIPASE 16 15   No results for input(s): AMMONIA in the last 168 hours. CBC:  Recent Labs Lab 06/18/15 2226 06/18/15 2320 06/19/15 0322 06/20/15 0500 06/21/15 0517  WBC  --  4.2 3.9* 4.6 4.7  NEUTROABS  --  3.3  --   --   --   HGB 18.7* 8.7* 10.2* 9.4* 9.4*  HCT 55.0* 26.0* 30.5* 28.8* 28.8*  MCV  --  98.1 100.3* 99.7 100.3*  PLT  --  56* 62* 67* 68*   Cardiac Enzymes:  Recent Labs Lab 06/19/15 0039 06/19/15 0322 06/19/15 1240  TROPONINI 0.06* 0.05* 0.04*   BNP: BNP (last 3 results)  Recent Labs  10/30/14 1300  BNP 55.3    ProBNP (last 3 results) No results for input(s): PROBNP in the last 8760 hours.  CBG:  Recent Labs Lab 06/22/15 0756 06/22/15 1142 06/22/15 1630 06/22/15 2044 06/23/15 0732  GLUCAP 221* 226* 202* 188* 191*       Signed:  Thurza Kwiecinski  [facility] 06/23/2015, 10:57 AM

## 2015-06-23 NOTE — Clinical Social Work Note (Signed)
CSW received call that pt required discharge home with ambulance  CSW met with pt and her husband at bedside to confirm address.  CSW prepared paperwork, provided it to RN and called for transport.  CSW called and communicated to hospice liaison that ambulance had been called and arranged for her to  meet pt and family at home to set up hospice in home services.   Dede Query, LCSW [place] Worker - Weekend Coverage cell #: [phone]

## 2015-06-25 ENCOUNTER — Telehealth: Payer: Self-pay | Admitting: Hematology and Oncology

## 2015-06-25 NOTE — Telephone Encounter (Signed)
Appointments cancelled per pof as pt is now hospice

## 2015-06-25 NOTE — Telephone Encounter (Signed)
Called and verified with Nunzio Cory that Dr. Lindi Adie will be attending and hospice will do symptom management.

## 2015-06-26 ENCOUNTER — Telehealth: Payer: Self-pay | Admitting: *Deleted

## 2015-06-26 ENCOUNTER — Ambulatory Visit: Payer: BLUE CROSS/BLUE SHIELD | Admitting: Physical Therapy

## 2015-07-05 NOTE — Telephone Encounter (Signed)
Received call from patient's sister Cephas Darby requesting physical therapy for patient. Returned call and advised her to discuss this with hospice as she is under their care now. She verbalized understanding.

## 2015-07-05 DEATH — deceased

## 2015-07-25 ENCOUNTER — Other Ambulatory Visit: Payer: Medicare Other

## 2015-07-27 ENCOUNTER — Ambulatory Visit: Payer: Medicare Other | Admitting: Hematology and Oncology

## 2015-09-19 ENCOUNTER — Ambulatory Visit: Payer: Medicare Other

## 2015-09-19 ENCOUNTER — Ambulatory Visit (HOSPITAL_COMMUNITY): Payer: Medicare Other

## 2015-09-20 ENCOUNTER — Ambulatory Visit: Payer: Medicare Other | Admitting: Radiation Oncology

## 2015-10-19 ENCOUNTER — Other Ambulatory Visit: Payer: Self-pay | Admitting: Nurse Practitioner

## 2016-03-26 NOTE — Telephone Encounter (Signed)
error 

## 2016-07-09 IMAGING — CR DG CHEST 2V
2 series · 2 of 2 positions shown · non-contrast
Comparison: 11/17/2014

CLINICAL DATA: Short of breath. Thoracentesis 2 weeks ago. Breast
cancer with lung metastasis.

EXAM:
CHEST  2 VIEW

[w chest pa]
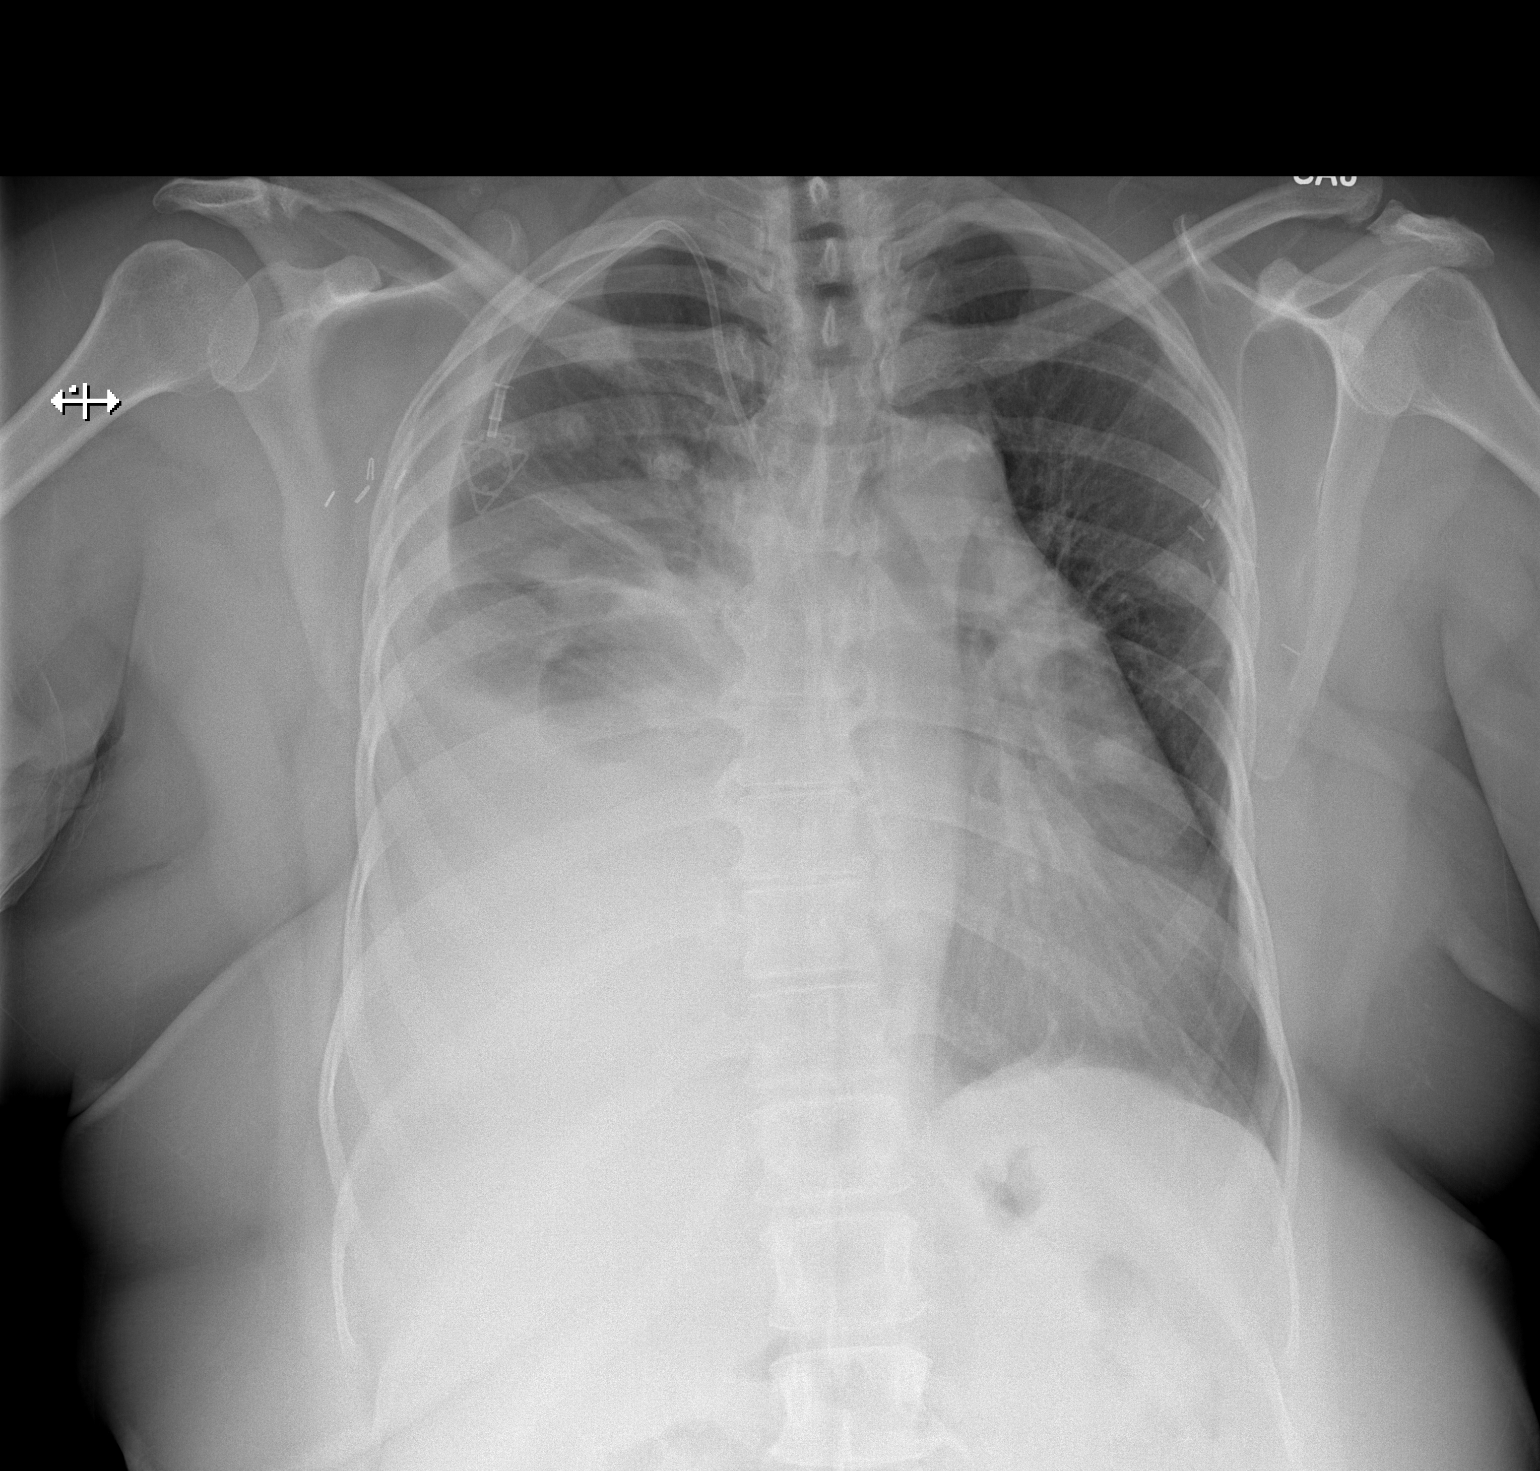

[w chest lat]
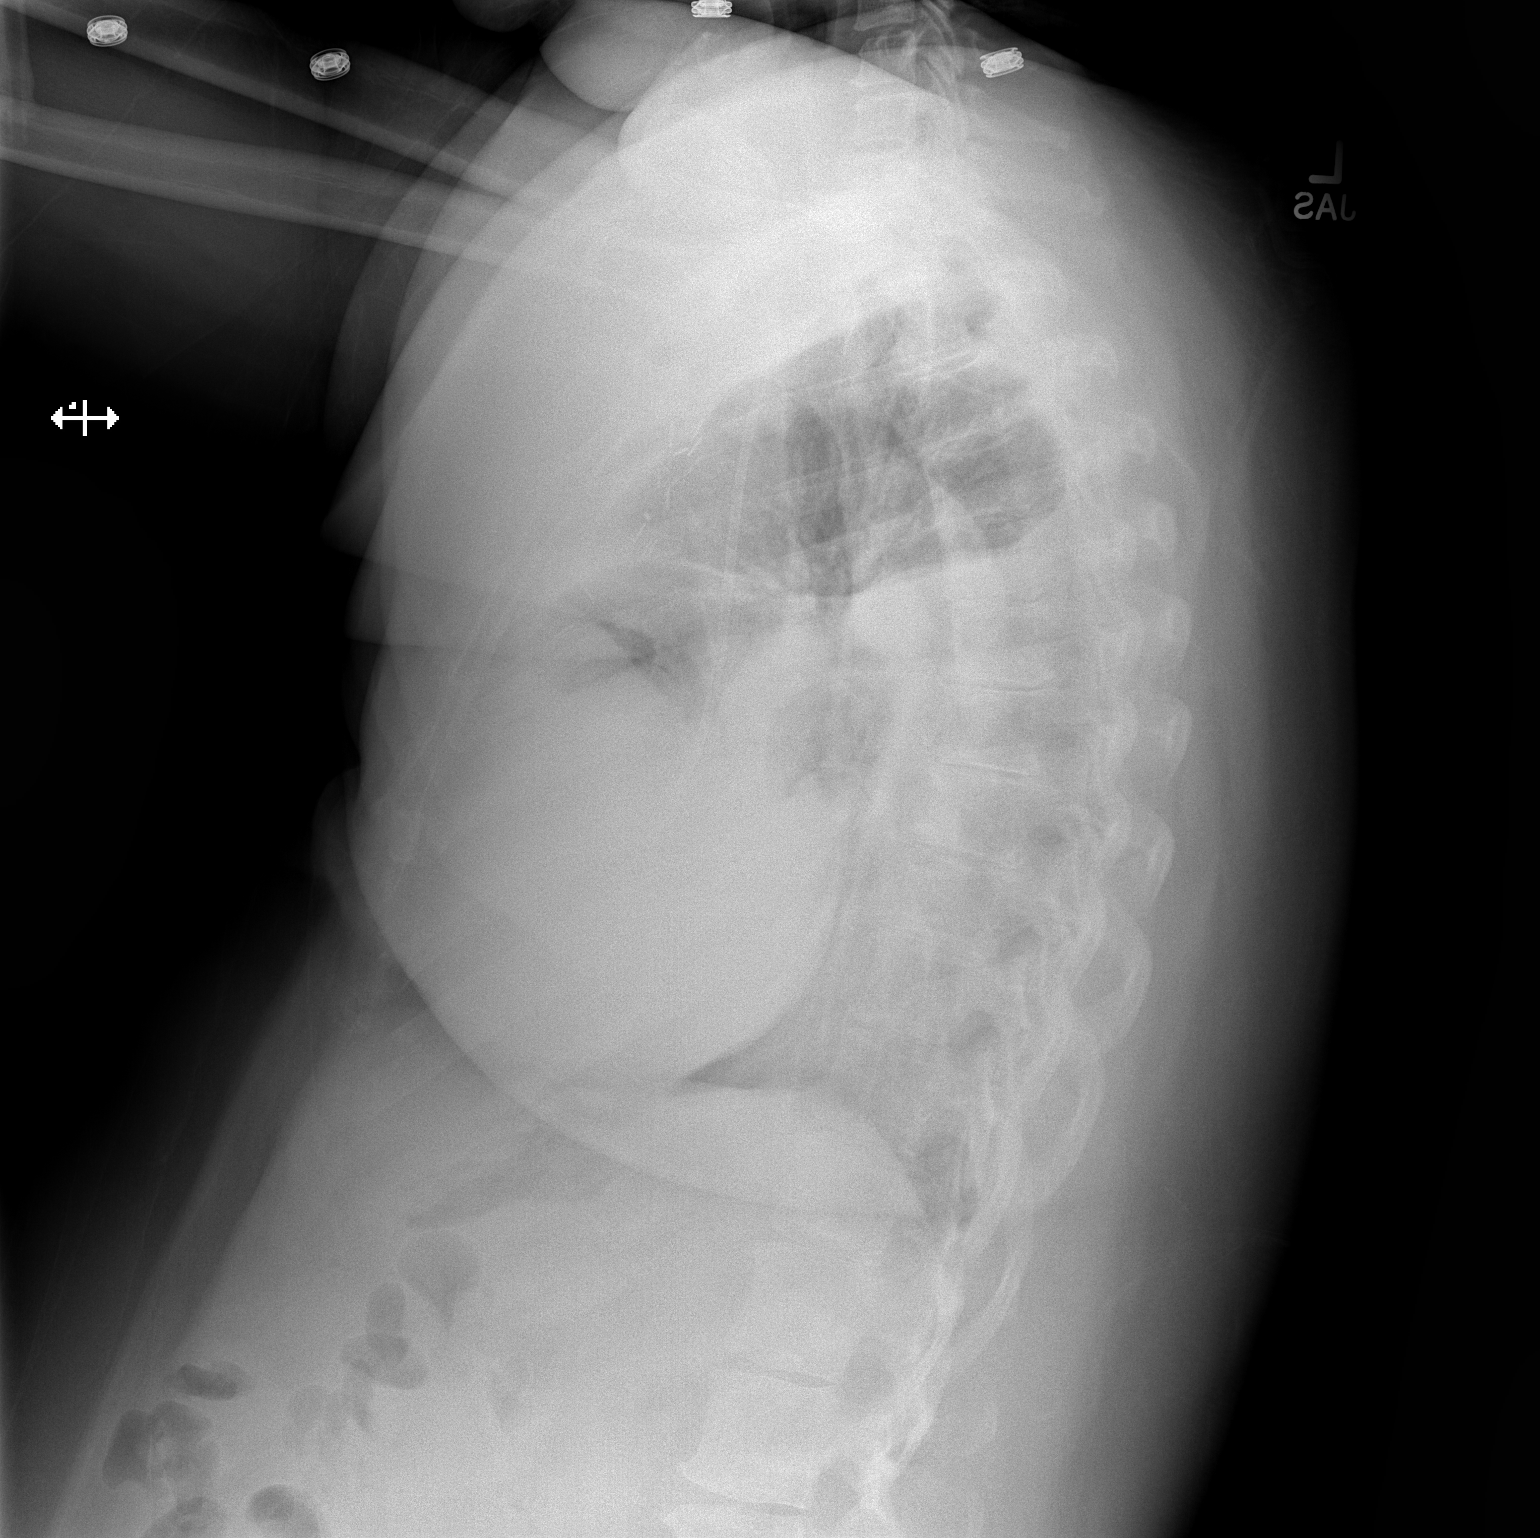

[2 of 2 positions shown; findings below may reference images not displayed]

FINDINGS: Right Port-A-Cath which terminates at the low SVC. Mild tracheal
deviation left. Mild cardiomegaly. Increase in moderate to large
right pleural effusion. No pneumothorax. Pulmonary metastasis again
identified. Worsened left-sided aeration with airspace disease
throughout the right mid and lower lung. Minimal volume loss at the
left lung base.
IMPRESSION: Worsened right-sided aeration due to increased pleural fluid and
adjacent atelectasis or infection.

Pulmonary metastasis, as before.

## 2016-07-10 IMAGING — US US THORACENTESIS ASP PLEURAL SPACE W/IMG GUIDE
1 series · 6 of 6 positions shown · non-contrast
Comparison: CXR 12/01/14.

MEDICATIONS:
None

COMPLICATIONS:
None immediate

INDICATION: Symptomatic recurrent right sided pleural effusion

EXAM:
US THORACENTESIS ASP PLEURAL SPACE W/IMG GUIDE
TECHNIQUE: Informed written consent was obtained from the patient after a
discussion of the risks, benefits and alternatives to treatment. A
timeout was performed prior to the initiation of the procedure.

[Series 1: us thoracentesis asp pleural space w/img guide · 0.34mm/px · 6 of 6 slices shown]
[im 1/6]
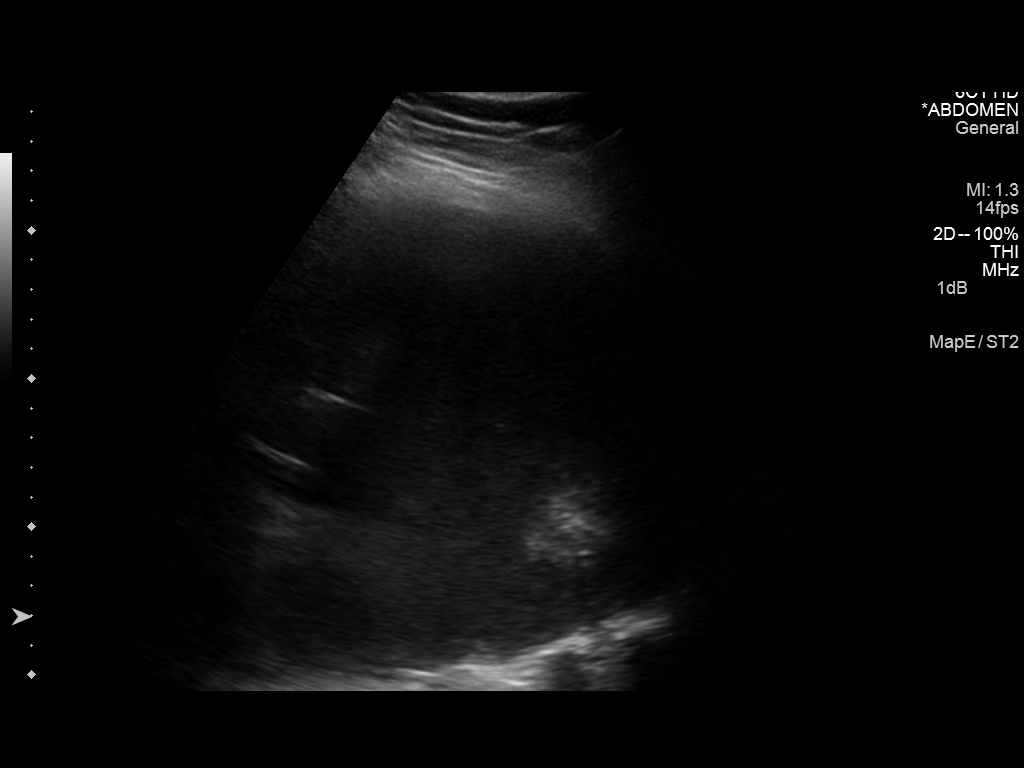
[im 2/6]
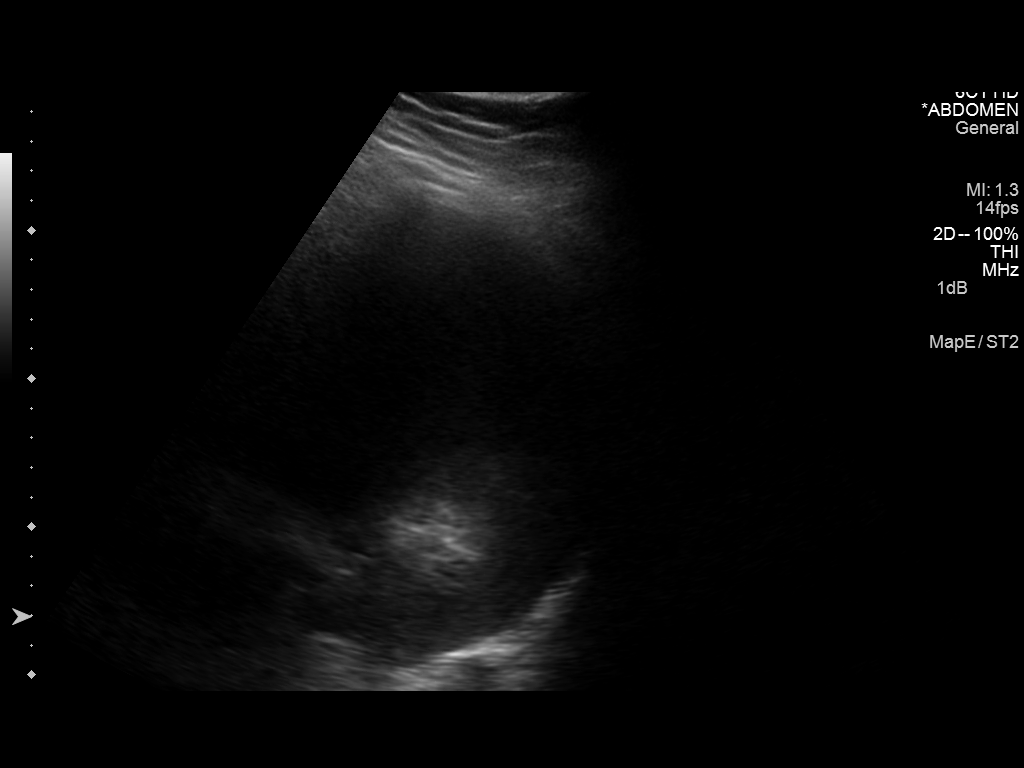
[im 3/6]
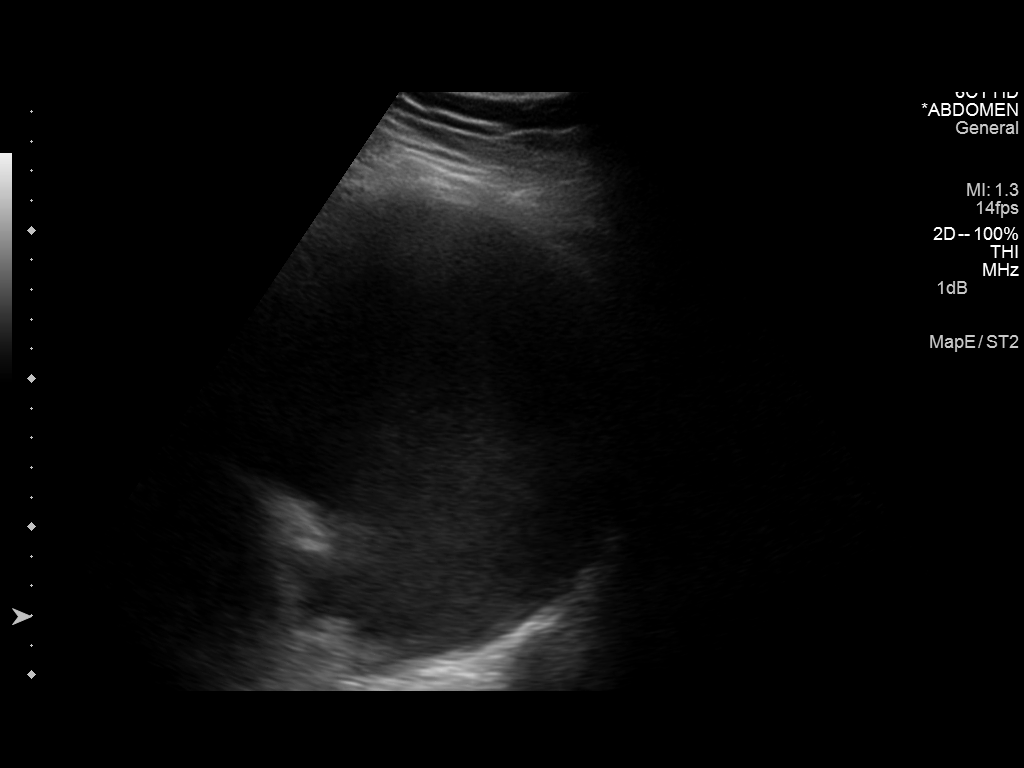
[im 4/6]
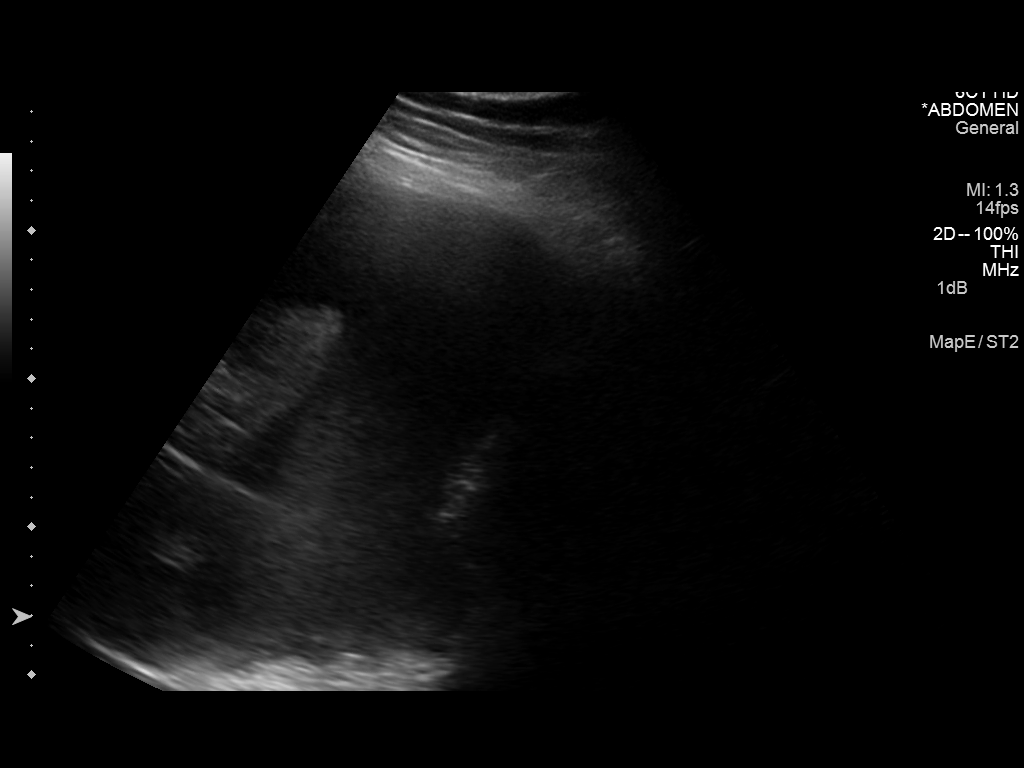
[im 5/6]
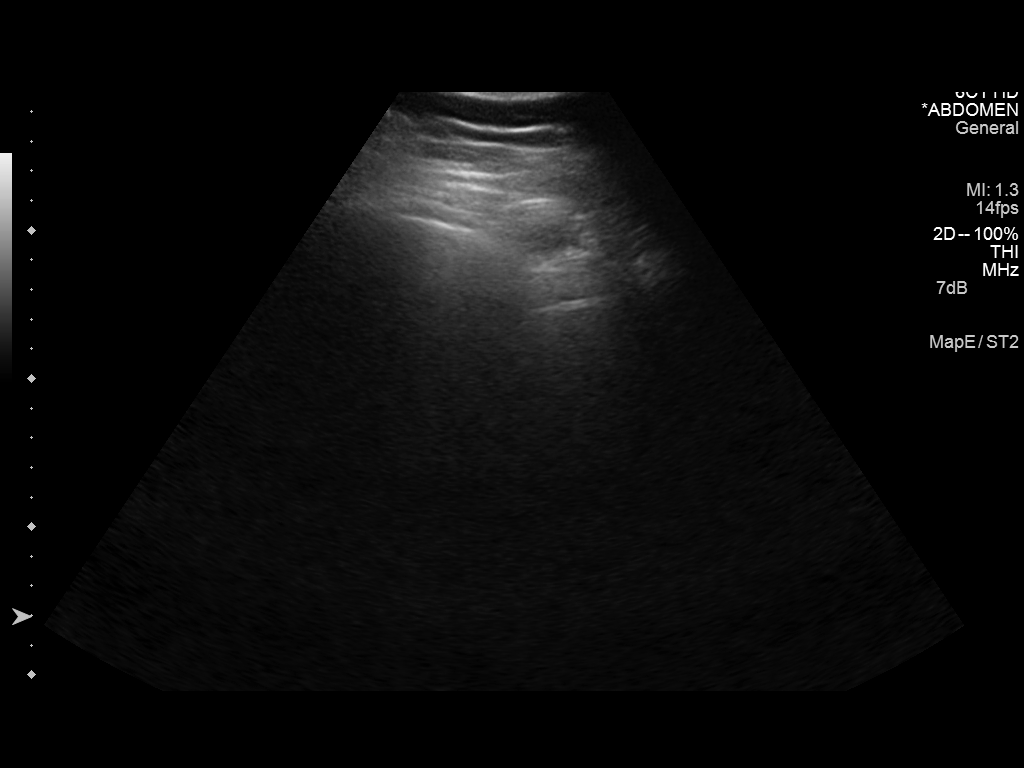
[im 6/6]
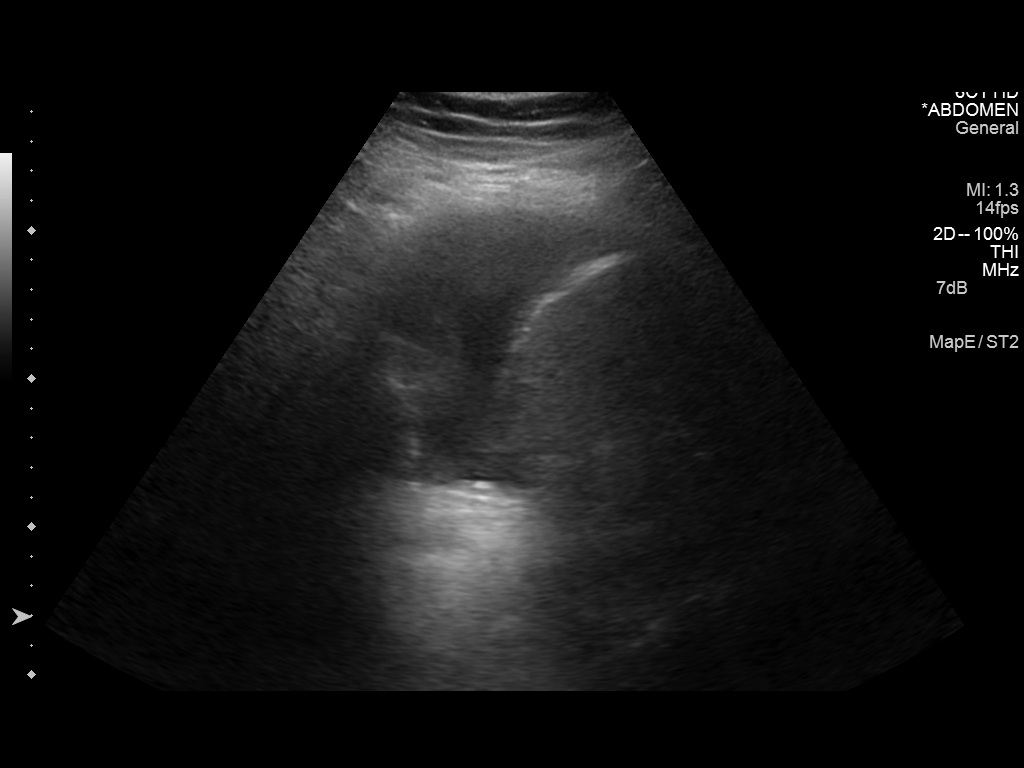

[6 of 6 positions shown; findings below may reference images not displayed]

Initial ultrasound scanning demonstrates a right pleural effusion.
The lower chest was prepped and draped in the usual sterile fashion.
1% lidocaine was used for local anesthesia.

An ultrasound image was saved for documentation purposes. A 6 Fr
Safe-T-Centesis catheter was introduced. The thoracentesis was
performed. The catheter was removed and a dressing was applied. The
patient tolerated the procedure well without immediate post
procedural complication. The patient was escorted to have an upright
chest radiograph.
FINDINGS: A total of approximately 1.2 liters of amber colored fluid was
removed. Requested samples were sent to the laboratory.
IMPRESSION: Successful ultrasound-guided right sided thoracentesis yielding
liters of pleural fluid.

## 2016-09-08 IMAGING — CT CT CHEST W/ CM
1 of 2 series · 15 of 31 positions shown, 19 images · IV contrast (OMNIPAQUE)
Comparison: PET-CT 11/27/2014

CLINICAL DATA: Short of breath. Known lung nodules. Bilateral
breast cancer.

EXAM:
CT CHEST WITH CONTRAST
TECHNIQUE: Multidetector CT imaging of the chest was performed during
intravenous contrast administration.
CONTRAST:  100mL OMNIPAQUE IOHEXOL 300 MG/ML  SOLN

[Series 2: rtn chest with st · axial · 0.72mm/px · z∈[+71,+331]mm · 15 of 61 slices shown, 19 images]
[im 5/61  mediastinal]
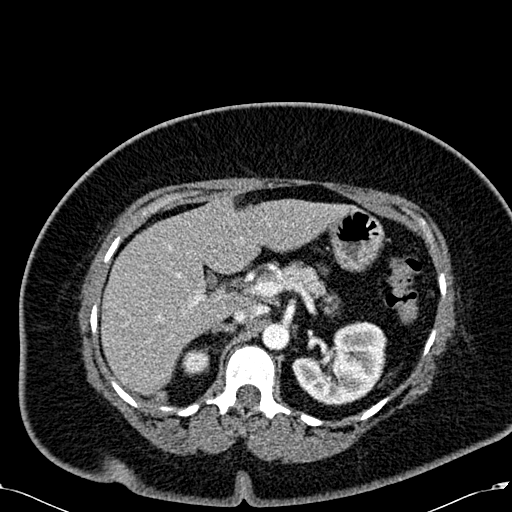
[im 5/61  lung]
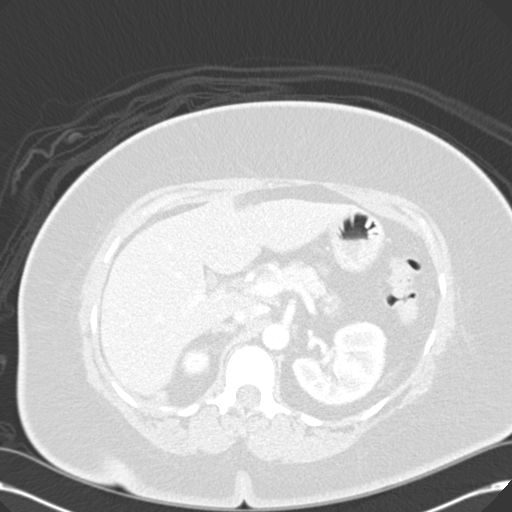
[im 9/61  lung]
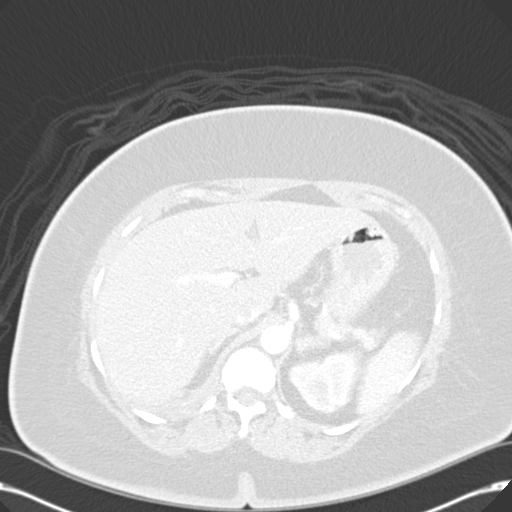
[im 13/61  lung]
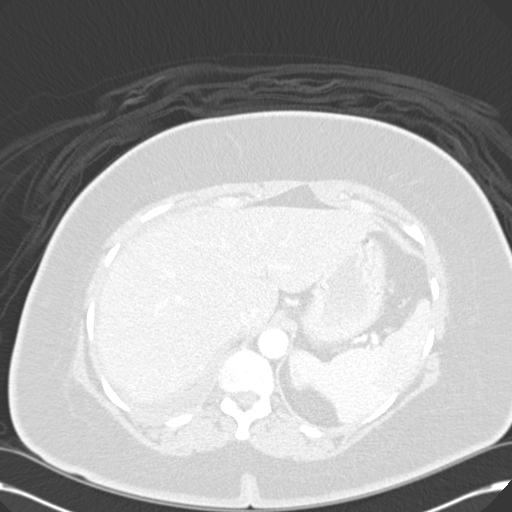
[im 17/61  lung]
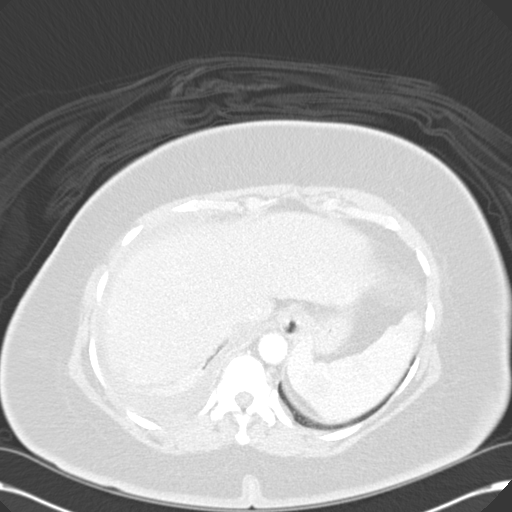
[im 21/61  mediastinal]
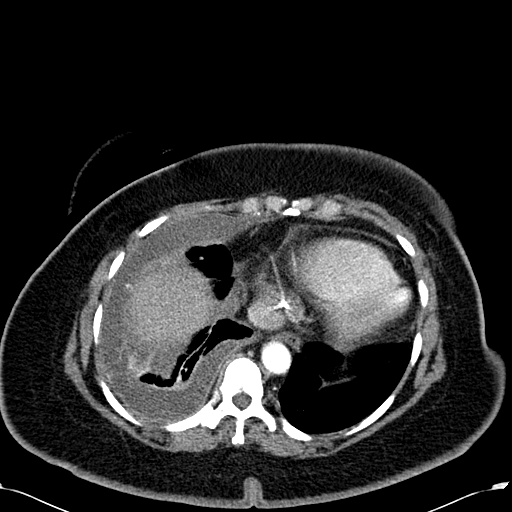
[im 21/61  lung]
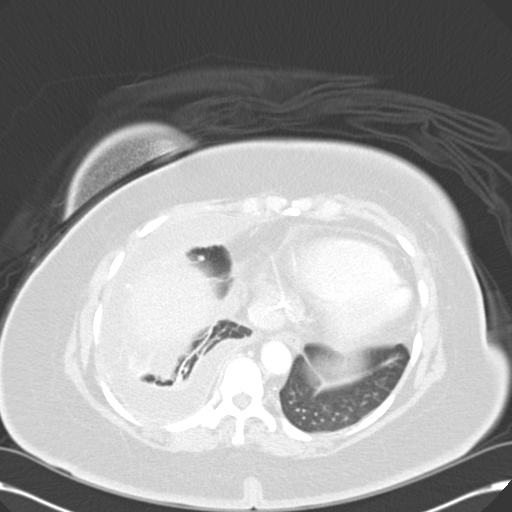
[im 25/61  lung]
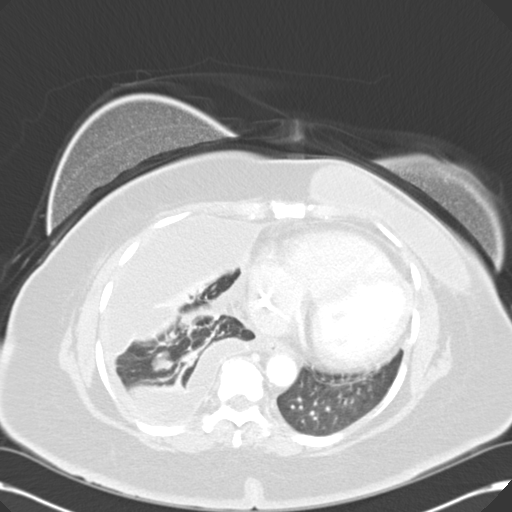
[im 29/61  lung]
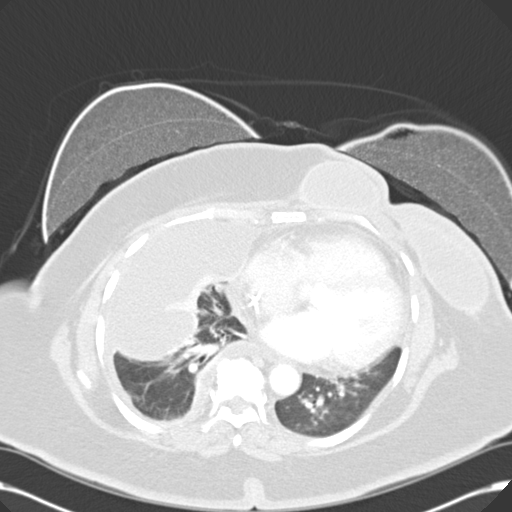
[im 31/61  lung]
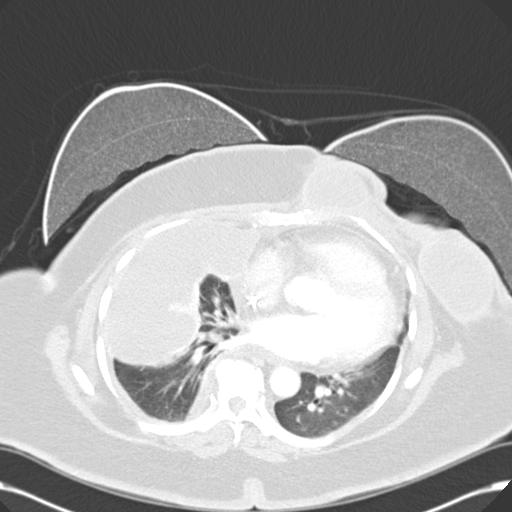
[im 33/61  mediastinal]
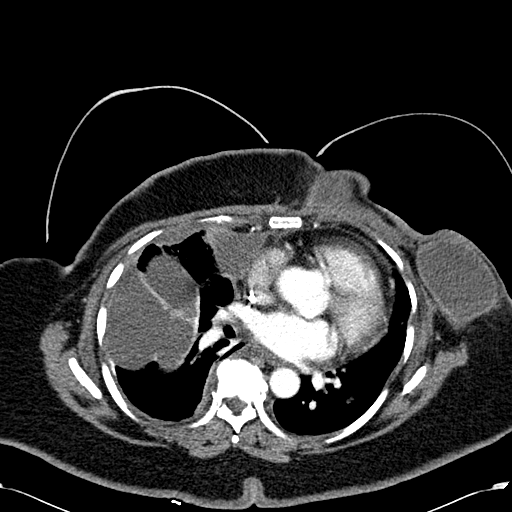
[im 33/61  lung]
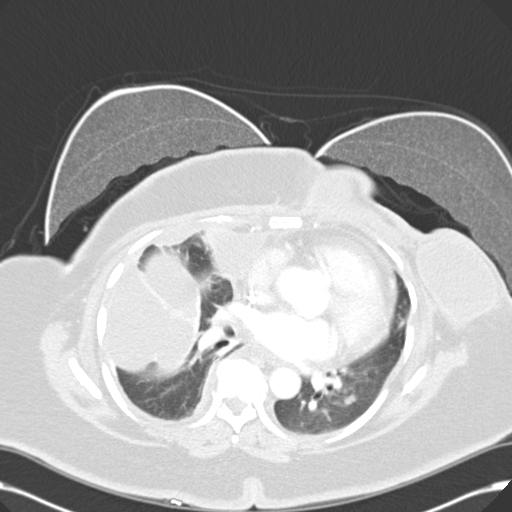
[im 37/61  lung]
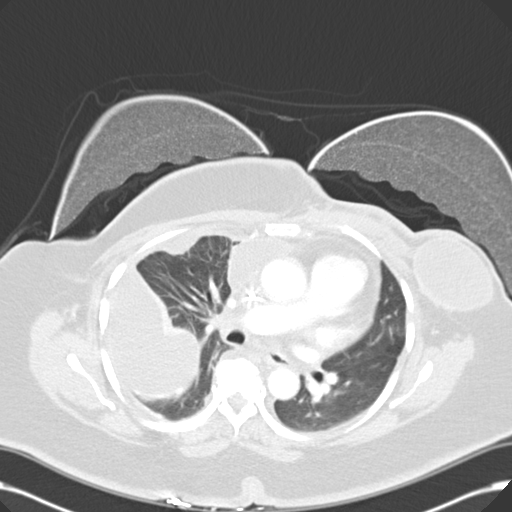
[im 41/61  lung]
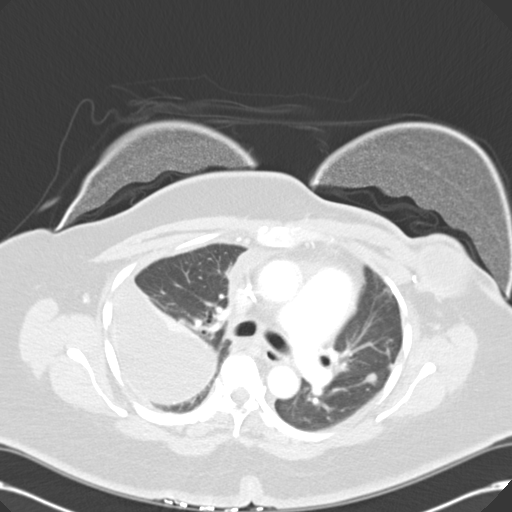
[im 45/61  lung]
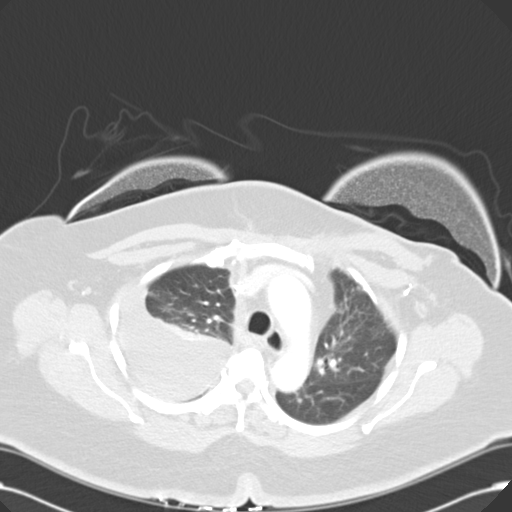
[im 49/61  mediastinal]
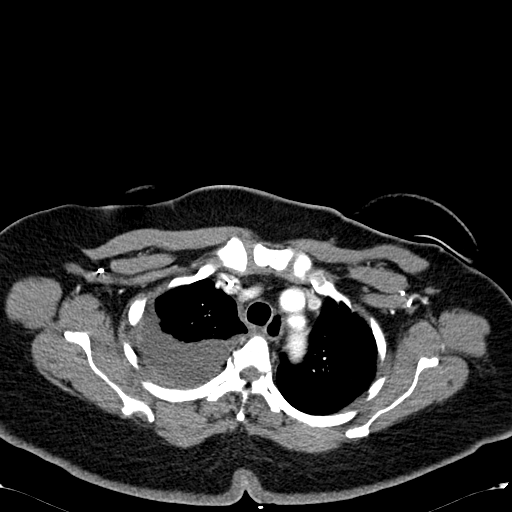
[im 49/61  lung]
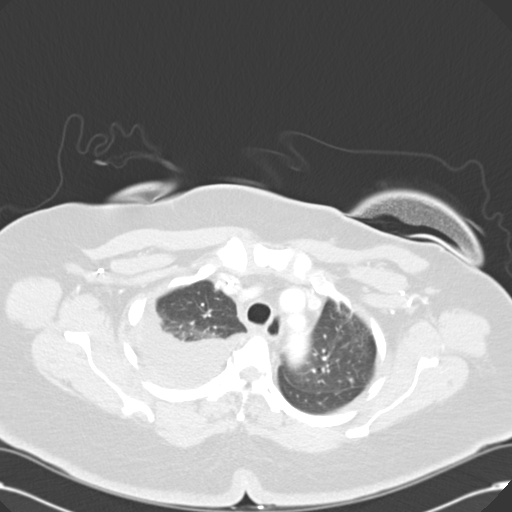
[im 53/61  lung]
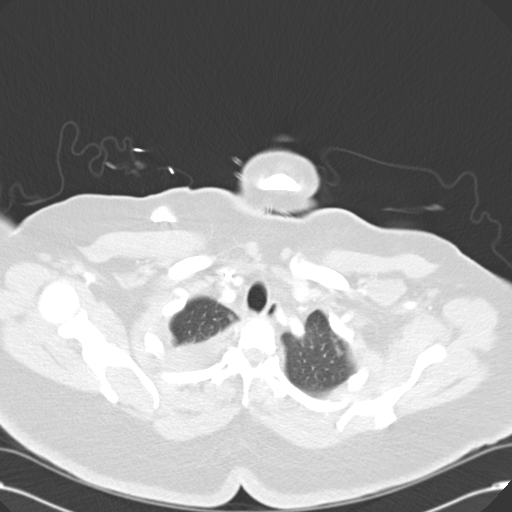
[im 57/61  lung]
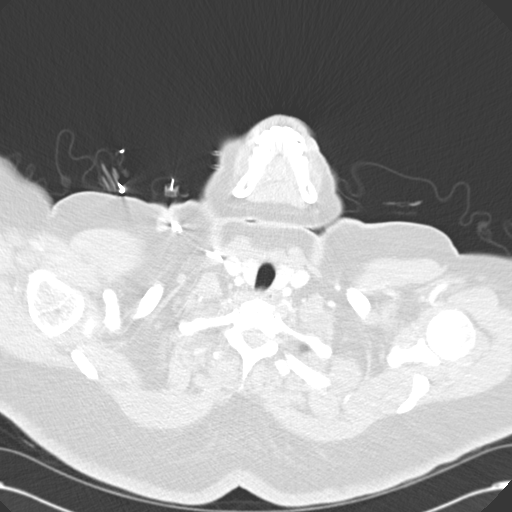

[15 of 31 positions shown; findings below may reference images not displayed]

FINDINGS: Mediastinum/Nodes: No axillary lymphadenopathy. Bilateral axillary
clips are noted. No mediastinal or hilar lymphadenopathy. No
pericardial fluid. Right perihilar mass seen on comparison PET-CT
scan is no longer evident.

Lungs/Pleura: There is loculated fluid collections within the right
hemi thorax. Rounded 2 cm fluid collection in the right lower lobe
also appears to be loculated fluid (image 40, series 2). There is
atelectasis within the right upper lobe and right lower lobe
associated with the fluid. Within the right middle lobe there is a 5
mm nodule image 41. Difficult to place on comparison exam but of
appears likely unchanged from 6 mm nodule. Peripheral nodule in the
right lower lobe measuring 4 mm is not seen on prior due to large
pleural effusion.

In the left lung, left lower lobe nodule measures 9 mm on image 22,
series 4. This nodule is decreased in size from 15 mm on PET-CT
scan. Left lower lobe nodule image 29 measuring 9 mm decreased from
11 mm.

Upper abdomen: No focal hepatic lesion limited view of the liver.
Adrenal glands are normal.

Musculoskeletal: There is a fluid collection within the anterior
right chest wall which may represent malshaped breast prosthetic. No
aggressive osseous lesion.
IMPRESSION: 1. Decrease in right hilar mass.
2. Interval decrease in size of left pulmonary nodular metastasis.
3. Interval decrease in right pleural effusion. Persistent loculated
right pleural effusion is present.
4. Right pulmonary nodules also appear decreased in size on the
difficult to measure due to the effusion
